# Patient Record
Sex: Female | Born: 1960 | Race: White | Hispanic: No | Marital: Single | Smoking: Former smoker
Health system: Southern US, Community
[De-identification: ages and names within clinical notes are randomized; demographics above are authoritative.]

## PROBLEM LIST (undated history)

## (undated) DIAGNOSIS — T1491XA Suicide attempt, initial encounter: Secondary | ICD-10-CM

## (undated) DIAGNOSIS — F419 Anxiety disorder, unspecified: Secondary | ICD-10-CM

## (undated) DIAGNOSIS — G8929 Other chronic pain: Secondary | ICD-10-CM

## (undated) DIAGNOSIS — J449 Chronic obstructive pulmonary disease, unspecified: Secondary | ICD-10-CM

## (undated) DIAGNOSIS — F32A Depression, unspecified: Secondary | ICD-10-CM

## (undated) DIAGNOSIS — J189 Pneumonia, unspecified organism: Secondary | ICD-10-CM

## (undated) DIAGNOSIS — Z789 Other specified health status: Secondary | ICD-10-CM

## (undated) DIAGNOSIS — F1910 Other psychoactive substance abuse, uncomplicated: Secondary | ICD-10-CM

## (undated) DIAGNOSIS — M5124 Other intervertebral disc displacement, thoracic region: Secondary | ICD-10-CM

## (undated) DIAGNOSIS — M549 Dorsalgia, unspecified: Secondary | ICD-10-CM

## (undated) DIAGNOSIS — F329 Major depressive disorder, single episode, unspecified: Secondary | ICD-10-CM

## (undated) DIAGNOSIS — Z8659 Personal history of other mental and behavioral disorders: Secondary | ICD-10-CM

## (undated) HISTORY — DX: Anxiety disorder, unspecified: F41.9

## (undated) HISTORY — DX: Personal history of other mental and behavioral disorders: Z86.59

## (undated) HISTORY — PX: TUBAL LIGATION: SHX77

## (undated) HISTORY — DX: Other intervertebral disc displacement, thoracic region: M51.24

## (undated) HISTORY — PX: CLAVICLE SURGERY: SHX598

---

## 2000-11-09 DIAGNOSIS — T1491XA Suicide attempt, initial encounter: Secondary | ICD-10-CM

## 2000-11-09 HISTORY — DX: Suicide attempt, initial encounter: T14.91XA

## 2000-11-27 ENCOUNTER — Inpatient Hospital Stay (HOSPITAL_COMMUNITY): Admission: EM | Admit: 2000-11-27 | Discharge: 2000-11-29 | Payer: Self-pay | Admitting: *Deleted

## 2009-05-02 ENCOUNTER — Emergency Department (HOSPITAL_COMMUNITY): Admission: EM | Admit: 2009-05-02 | Discharge: 2009-05-02 | Payer: Self-pay | Admitting: Emergency Medicine

## 2009-09-09 ENCOUNTER — Emergency Department (HOSPITAL_COMMUNITY): Admission: EM | Admit: 2009-09-09 | Discharge: 2009-09-09 | Payer: Self-pay | Admitting: Emergency Medicine

## 2009-12-19 ENCOUNTER — Emergency Department (HOSPITAL_COMMUNITY): Admission: EM | Admit: 2009-12-19 | Discharge: 2009-12-19 | Payer: Self-pay | Admitting: Emergency Medicine

## 2011-02-16 LAB — BASIC METABOLIC PANEL
BUN: 7 mg/dL (ref 6–23)
Calcium: 8.6 mg/dL (ref 8.4–10.5)
Creatinine, Ser: 0.6 mg/dL (ref 0.4–1.2)
GFR calc Af Amer: >60 mL/min (ref >60)
GFR calc non Af Amer: >60 mL/min (ref >60)

## 2011-02-16 LAB — DIFFERENTIAL
Basophils Absolute: 0 10*3/uL (ref 0.0–0.1)
Eosinophils Relative: 4 % (ref 0–5)
Lymphocytes Relative: 38 % (ref 12–46)
Lymphs Abs: 2.4 10*3/uL (ref 0.7–4.0)
Monocytes Absolute: 0.4 10*3/uL (ref 0.1–1.0)
Monocytes Relative: 7 % (ref 3–12)
Neutro Abs: 3.2 10*3/uL (ref 1.7–7.7)

## 2011-02-16 LAB — URINALYSIS, ROUTINE W REFLEX MICROSCOPIC
Bilirubin Urine: NEGATIVE
Hgb urine dipstick: NEGATIVE
Ketones, ur: NEGATIVE mg/dL
Nitrite: NEGATIVE
Protein, ur: NEGATIVE mg/dL
Specific Gravity, Urine: >1.03 — ABNORMAL HIGH (ref 1.005–1.030)

## 2011-02-16 LAB — CBC
Hemoglobin: 12.6 g/dL (ref 12.0–15.0)
RBC: 3.75 MIL/uL — ABNORMAL LOW (ref 3.87–5.11)
WBC: 6.3 10*3/uL (ref 4.0–10.5)

## 2011-02-16 LAB — URINE MICROSCOPIC-ADD ON

## 2011-03-27 NOTE — Discharge Summary (Signed)
[facility]  Patient:    Casey [name], Casey [name]                    MRN: [id number] Adm. Date:  11/27/00 Disc. Date: 11/29/00 Attending:  Otilio Saber Dictator:   Candi Leash. Orsini, N.P.                           Discharge Summary  HISTORY OF PRESENT ILLNESS:  This is a 50 year old, separated, white female, transferred from [facility] for suicide attempt with an overdose on Xanax.  The patient was transferred after being medically cleared for further psychiatric evaluation for depression and suicide attempt.  The patient overdosed on 30 Xanax and has spent a day of hospitalization in intensive care and on a ventilator for her overdose.  Precipitant to this overdose was a bad relationship with her boyfriend.  The patient has recently separated from her husband and has had several bad relationships in the past.  The patient reports some depression prior to this event and has been tried on antidepressant.  She states she may not have been on the medicine long enough for it to be effective.  The patient states she knew it was an impulsive act and wanted to only sleep and not to actually hurt herself.  Her appetite has been good.  She denies any auditory or visual hallucinations.  No paranoia. She is currently denying any suicidal or homicidal ideation.  This is the first admission for the psychiatric problem.  She has had no prior suicide attempts.  PAST MEDICAL HISTORY:  Primary care :  Dr. Roslynn Amble.  Her medical problems are asthma.  MEDICATIONS:  The patient has been on Xanax 1 mg b.i.d. for anxiety.  DRUG ALLERGIES:  The patient is allergic to AMPICILLIN, gets itchy.  PHYSICAL EXAMINATION:  Physical examination was performed at [facility] after her overdosing on 30 Xanax tabs, spent a day in ICU.  Urine drug screen was positive for benzodiazepines and positive for cocaine.  MENTAL STATUS EXAMINATION:  The patient is an alert,  young, white female, nicely dressed.  She is calm and cooperative.  Her speech is normal and relevant.  Her mood is pleasant.  Her affect is sad.  Thought processes are coherent.  There is no evidence of psychosis.  No auditory or visual hallucinations.  No homicidal ideation.  The patient was denying any current suicidal ideation, although she did have a serious overdose the day prior to [facility].  Cognitive function is intact.  Her memory is good.  Judgment is poor.  Insight is fair.  She has poor impulse control.  ADMISSION DIAGNOSES: Axis I:    Major depressive disorder, not otherwise specified. Axis II:   Deferred. Axis III:  Asthma. Axis IV:   Moderate with problems with primary support group. Axis V:    Current is 35, this past year 44.  HOSPITAL COURSE:  This is [facility] attempt and depression.  She is to contract for safety.  We will monitor her q.15 minutes.  The patient agrees to be safe on the unit.  Seroquel will be ordered on a p.r.n. basis for anxiety with trazodone ordered for sleep.  We will initiate a low-dose antidepressant with our plan to return the patient to her prior living arrangements.  The patient overall was doing well.   Her affect was better.  She  was feeling better.  She was denying any suicidal ideation.  Her mood and affect were bright.  She was sleeping and eating well with only mild anxiety present.  There was discussion to the patient with her relationship problems and need for possible therapy.  It was felt that the patient could be managed on an outpatient basis.  DISPOSITION:  The patient was discharged to home.  She is to follow up at [facility].  The patient was to call the following morning with an appointment with the phone number provided.  DISCHARGE MEDICATIONS: 1. Celexa 20 mg one-half tab q.a.m. 2. Klonopin 0.5 mg one-half tab two to three times a day  p.r.n. 3. Trazodone 50 mg one-half to one tab q.h.s. p.r.n.  ACTIVITY/DIET:  There is no restrictions for activity or diet.  DISCHARGE DIAGNOSES: Axis I:    Major depressive disorder. Axis II:   Deferred. Axis III:  Asthma. Axis IV:   Moderate with problems related to primary support group. Axis V:    Current is 60, this past year 52. DD:  01/05/01 TD:  01/06/01 Job: [id number]

## 2011-03-27 NOTE — H&P (Signed)
[facility]  Patient:    Casey [name], Casey [name]                    MRN: [id number] Adm. Date:  [id number] Attending:  Otilio Saber Dictator:   Candi Leash. Theressa Stamps, N.P.                   Psychiatric Admission Assessment  IDENTIFYING INFORMATION:  This is a 50 year old separated white female transferred from [facility] on November 27, 2000, for suicide attempt with an overdose on Xanax.  HISTORY OF PRESENT ILLNESS:  Patient was a transfer from [facility] after being medically cleared for further psychiatric evaluation of her depression and suicide attempt.  Patient had overdosed on 30 Xanax and had spent a day hospitalization in intensive care on a ventilator for her overdose.  Her precipitant to this overdose was having a bad relationship with her boyfriend.  Patient is recently separated from her husband and has had some bad relationships in the past.  Patient reports some depression prior to this event and has been tried on antidepressants in the past.  She states she may not have been on the medicine long enough for it to be effective.  Patient states that she knew it was an impulsive act and that she wanted to sleep only and not to hurt herself.  She reports her appetite has been good.  She denies any auditory or visual hallucinations, no paranoia.  She is currently denying any suicidal or homicidal ideation.  PAST PSYCHIATRIC HISTORY:  This is the first admission for a psychiatric problem.  She has had no prior suicide attempts.  SOCIAL HISTORY:  She is a 50 year old separated white female.  She has a 56 year old son.  She lives alone.  She works in Designer, fashion/clothing.  She completed the 12th grade.  She has no financial or legal problems.  FAMILY HISTORY:  She has a father and brother with alcohol problems.  ALCOHOL/DRUG HISTORY:  She smokes 2-3 cigarettes a day for about four years, drinks about two drinks per week, occasional marijuana use.   Denied any other substance abuse, although her urine drug screen at [facility] was positive for cocaine.  PRIMARY CARE :  Dr. _________ .  MEDICAL PROBLEMS:  Asthma.  She seldom uses any medicines, rarely has problems.  MEDICATIONS:  She has been on Xanax 1 mg b.i.d. for anxiety.  DRUG ALLERGIES:  She is allergic to AMPICILLIN.  She gets itchy.  PHYSICAL EXAMINATION:  Performed at [facility] after her overdose on her 30 Xanax tabs.  She spent one day in ICU on a ventilator.  Her urine drug screen was positive for benzodiazepines and positive for cocaine.  MENTAL STATUS EXAMINATION:  She is an alert young white female.  She is nicely dressed.  She is calm and cooperative.  Her speech is normal and relevant. Her mood is pleasant.  Her affect is sad.  Thought processes are coherent. There is no evidence of psychosis.  No auditory or visual hallucinations.  No homicidal ideation.  Denies any current suicidal ideation, although she did have a serious overdose on January 18.  Cognitive functioning is intact.  Her memory is good.  Her judgment is poor.  Insight is fair.  She had poor impulse control.  DIAGNOSES: Axis I:    Major depressive disorder, not otherwise specified. Axis II:   Deferred. Axis III:  Asthma. Axis IV:   Moderate with problems relating to primary support  group. Axis V:    Current 35, this past year 83.  PLAN:  Voluntary admission to [facility] for suicide attempt and depression.  Contract for safety.  Check every 15 minutes.  Patient agrees to be safe.  Will order Seroquel 25 every six hours on a p.r.n. basis for anxiety, Trazodone for sleep.  Will initiate a low dose antidepressant and our plan is to return patient to her prior living arrangement.  Tentative length of stay is three days. DD:  11/28/00 TD:  11/28/00 Job: [id number]

## 2012-04-07 DIAGNOSIS — R079 Chest pain, unspecified: Secondary | ICD-10-CM

## 2013-05-21 ENCOUNTER — Encounter (HOSPITAL_COMMUNITY): Payer: Self-pay | Admitting: *Deleted

## 2013-05-21 ENCOUNTER — Emergency Department (HOSPITAL_COMMUNITY)
Admission: EM | Admit: 2013-05-21 | Discharge: 2013-05-21 | Disposition: A | Discharge status: Home / Self Care | Payer: BC Managed Care – PPO | Attending: Emergency Medicine | Admitting: Emergency Medicine

## 2013-05-21 DIAGNOSIS — G5631 Lesion of radial nerve, right upper limb: Secondary | ICD-10-CM

## 2013-05-21 DIAGNOSIS — M79609 Pain in unspecified limb: Secondary | ICD-10-CM | POA: Insufficient documentation

## 2013-05-21 DIAGNOSIS — Z7982 Long term (current) use of aspirin: Secondary | ICD-10-CM | POA: Insufficient documentation

## 2013-05-21 DIAGNOSIS — Z79899 Other long term (current) drug therapy: Secondary | ICD-10-CM | POA: Insufficient documentation

## 2013-05-21 DIAGNOSIS — F17200 Nicotine dependence, unspecified, uncomplicated: Secondary | ICD-10-CM | POA: Insufficient documentation

## 2013-05-21 DIAGNOSIS — G5630 Lesion of radial nerve, unspecified upper limb: Secondary | ICD-10-CM | POA: Insufficient documentation

## 2013-05-21 MED ORDER — OXYCODONE-ACETAMINOPHEN 5-325 MG PO TABS: 1.0000 | ORAL_TABLET | Freq: Four times a day (QID) | ORAL | Status: DC | PRN

## 2013-05-21 MED ORDER — OXYCODONE-ACETAMINOPHEN 5-325 MG PO TABS
1.0000 | ORAL_TABLET | Freq: Once | ORAL | Status: AC
  Administered 2013-05-21: 1 via ORAL
  Filled 2013-05-21: qty 1

## 2013-05-21 NOTE — ED Notes (Signed)
Pt states that she woke up yesterday with right arm. States that she is unable to grasp anything with her right hand without dropping it. Denies any problems with speech, denies any injury or pain .

## 2013-05-21 NOTE — ED Provider Notes (Signed)
History    This chart was scribed for [employer]. Rubin Payor, MD by Leone Payor, ED Scribe. This patient was seen in room APA12/APA12 and the patient's care was started 1:55 PM.  CSN: [id number] Arrival date & time 05/21/13  1330  First MD Initiated Contact with Patient 05/21/13 1350     Chief Complaint  Patient presents with  . Weakness    The history is provided by the patient. No language interpreter was used.   HPI Comments: Casey [name] is a 52 y.o. female who presents to the Emergency Department complaining of constant, unchanged weakness to the R arm that started yesterday. Per pt, she woke up with her R arm underneath her body. States the arm was asleep when she woke up. She denies h/o carpal tunnel. Reports her elbow is painful but she can move it. She was unable to hold objects very well without dropping it. States she can hold a can of soda for a few seconds and then she will lose control and tip over. She denies HA.    History reviewed. No pertinent past medical history. Past Surgical History  Procedure Laterality Date  . Cesarean section     No family history on file. History  Substance Use Topics  . Smoking status: Current Every Day Smoker  . Smokeless tobacco: Not on file  . Alcohol Use: Yes     Comment: occassional   OB History   Grav Para Term Preterm Abortions TAB SAB Ect Mult Living                 Review of Systems  Musculoskeletal: Positive for arthralgias (R arm).  Neurological: Positive for numbness.  All other systems reviewed and are negative.    Allergies  Review of patient's allergies indicates no known allergies.  Home Medications   Current Outpatient Rx  Name  Route  Sig  Dispense  Refill  . ALPRAZolam (XANAX) 1 MG tablet   Oral   Take 1 mg by mouth 3 (three) times daily as needed for sleep.         Marland Kitchen aspirin EC 81 MG tablet   Oral   Take 81 mg by mouth daily.         Marland Kitchen BIOTIN PO   Oral   Take 1 tablet by mouth daily.         . fish oil-omega-3 fatty acids 1000 MG capsule   Oral   Take 3 g by mouth 3 (three) times daily.          Marland Kitchen oxyCODONE-acetaminophen (PERCOCET) 7.5-325 MG per tablet   Oral   Take 1 tablet by mouth every 4 (four) hours as needed for pain.         Marland Kitchen oxyCODONE-acetaminophen (PERCOCET/ROXICET) 5-325 MG per tablet   Oral   Take 1-2 tablets by mouth every 6 (six) hours as needed for pain.   10 tablet   0    BP 127/88  Pulse 68  Temp(Src) 98.5 F (36.9 C)  Resp 17  Ht 5\' 8"  (1.727 m)  Wt 132 lb (59.875 kg)  BMI 20.08 kg/m2  SpO2 98% Physical Exam  Nursing note and vitals reviewed. Constitutional: She is oriented to person, place, and time. She appears well-developed and well-nourished.  HENT:  Head: Normocephalic and atraumatic.  Eyes: Conjunctivae and EOM are normal. Pupils are equal, round, and reactive to light.  Neck: Normal range of motion. Neck supple.  Cardiovascular: Normal rate, regular rhythm and  normal heart sounds.   Good cap refill  Pulmonary/Chest: Effort normal and breath sounds normal.  Abdominal: Soft. Bowel sounds are normal.  Musculoskeletal: Normal range of motion.  Abduction and adduction intact at bilateral shoulders. Flexion and extension at bilateral elbows intact. Weakness with flexion of R wrist. Flexion and extension otherwise intact. Paresthesia over radial and ulnar distribution on R hand. Normal on L hand. Normal medial on R hand. Weakened pronation on R forearm.   Neurological: She is alert and oriented to person, place, and time. No cranial nerve deficit.  Normal gait.   Skin: Skin is warm and dry.  Psychiatric: She has a normal mood and affect.    ED Course  Procedures (including critical care time)  DIAGNOSTIC STUDIES: Oxygen Saturation is 98% on RA, normal by my interpretation.    COORDINATION OF CARE: 2:04 PM Discussed treatment plan with pt at bedside and pt agreed to plan.   Labs Reviewed - No data to display No results  found. 1. Radial nerve palsy, right     MDM  Patient with radial nerve palsy on the right side. She states she slept on it wrong. There is some pain also. there is not other evidence of central ischemia. Will splint, give pain medicines, had follow with neurology  I personally performed the services described in this documentation, which was scribed in my presence. The recorded information has been reviewed and is accurate.    Juliet Rude. Rubin Payor, MD 05/21/13 1438

## 2013-11-06 ENCOUNTER — Encounter (HOSPITAL_COMMUNITY): Payer: Self-pay | Admitting: Emergency Medicine

## 2013-11-06 ENCOUNTER — Emergency Department (HOSPITAL_COMMUNITY)
Admission: EM | Admit: 2013-11-06 | Discharge: 2013-11-06 | Disposition: A | Discharge status: Home / Self Care | Payer: BC Managed Care – PPO | Attending: Emergency Medicine | Admitting: Emergency Medicine

## 2013-11-06 ENCOUNTER — Emergency Department (HOSPITAL_COMMUNITY): Payer: BC Managed Care – PPO

## 2013-11-06 DIAGNOSIS — Y939 Activity, unspecified: Secondary | ICD-10-CM | POA: Insufficient documentation

## 2013-11-06 DIAGNOSIS — W010XXA Fall on same level from slipping, tripping and stumbling without subsequent striking against object, initial encounter: Secondary | ICD-10-CM | POA: Insufficient documentation

## 2013-11-06 DIAGNOSIS — S42001A Fracture of unspecified part of right clavicle, initial encounter for closed fracture: Secondary | ICD-10-CM

## 2013-11-06 DIAGNOSIS — F17200 Nicotine dependence, unspecified, uncomplicated: Secondary | ICD-10-CM | POA: Insufficient documentation

## 2013-11-06 DIAGNOSIS — Y929 Unspecified place or not applicable: Secondary | ICD-10-CM | POA: Insufficient documentation

## 2013-11-06 DIAGNOSIS — S42023A Displaced fracture of shaft of unspecified clavicle, initial encounter for closed fracture: Secondary | ICD-10-CM | POA: Insufficient documentation

## 2013-11-06 DIAGNOSIS — Z79899 Other long term (current) drug therapy: Secondary | ICD-10-CM | POA: Insufficient documentation

## 2013-11-06 MED ORDER — HYDROMORPHONE HCL PF 1 MG/ML IJ SOLN
1.0000 mg | Freq: Once | INTRAMUSCULAR | Status: AC
  Administered 2013-11-06: 1 mg via INTRAMUSCULAR
  Filled 2013-11-06: qty 1

## 2013-11-06 NOTE — ED Notes (Signed)
Tripped and fell  2 days ago, Pain rt shoulder.

## 2013-11-06 NOTE — ED Notes (Addendum)
Pt fell after tripping over her mother's oxygen cord and landed on right shoulder, denies hitting her head or LOC

## 2013-11-07 ENCOUNTER — Ambulatory Visit (INDEPENDENT_AMBULATORY_CARE_PROVIDER_SITE_OTHER): Payer: BC Managed Care – PPO | Admitting: Orthopedic Surgery

## 2013-11-07 ENCOUNTER — Encounter: Payer: Self-pay | Admitting: Orthopedic Surgery

## 2013-11-07 VITALS — BP 106/78 | Ht 68.0 in | Wt 133.0 lb

## 2013-11-07 DIAGNOSIS — S42009A Fracture of unspecified part of unspecified clavicle, initial encounter for closed fracture: Secondary | ICD-10-CM | POA: Insufficient documentation

## 2013-11-07 DIAGNOSIS — S42001A Fracture of unspecified part of right clavicle, initial encounter for closed fracture: Secondary | ICD-10-CM

## 2013-11-07 NOTE — Progress Notes (Signed)
Patient ID: Casey [name], female   DOB: [birth year], 52 y.o.   MRN: [id number]  Chief Complaint  Patient presents with  . 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 She is followed for chronic back and knee pain with a diagnosis of lumbar disc disease requiring Percocet 7.5 mg for relief  Her right shoulder is painful throbbing stabbing sharp 10 out of 10 constant pain associated with swelling no numbness or tingling is noted she has a history of snoring joint pain instability stiffness anxiety seasonal allergies  Her medical problems are anxiety. Surgeries C-section. Medications Xanax 0.1 mg oxycodone 7.5 mg normal. Family history heart disease arthritis lung disease cancer asthma and diabetes  Social history divorced. 3 cigarettes per day. No alcohol use.  Normal development grooming hygiene are noted mood and affect are normal she is oriented x3 she is now ambulatory abnormalities her vital signs are stable BP 106/78  Ht 5\' 8"  (1.727 m)  Wt 133 lb (60.328 kg)  BMI 20.23 kg/m2  Her right shoulder shows tenderness over the clavicle is no tenting of the skin she has decreased range of motion stability cannot be assessed motor exam revealed normal muscle tone hand and wrist function were normal skin was intact except for an abrasion over the medial side the arm which was superficial. She has normal pulse and sensation in the right arm and no lymphadenopathy.  X-ray showed midshaft clavicle fracture minimal displacement  3 shoulder x-rays were also reviewed with the clavicle x-rays  Recommend sling She'll be out of work 8-12 weeks Switch to morphine MS Contin 15 mg twice a day along with her 7.5 mg of Percocet  Return in 2 months for x-ray until healing

## 2013-11-07 NOTE — Patient Instructions (Addendum)
OOW X 12 WEEKS   WEAR SLING   SLEEP IN RECLINER OR UPRIGHT FOR 2 WEEKS   Clavicle Fracture A clavicle fracture is a break in the collarbone. This is a common injury, especially in children. Collarbones do not harden until around the age of 83. Most collarbone fractures are treated with a simple arm sling. In some cases a figure-of-eight splint is used to help hold the broken bones in position. Although not often needed, surgery may be required if the bone fragments are not in the correct position (displaced).  HOME CARE INSTRUCTIONS   Apply ice to the injury for 15-20 minutes each hour while awake for 2 days. Put the ice in a plastic bag and place a towel between the bag of ice and your skin.  Wear the sling or splint constantly for as long as directed by your caregiver. You may remove the sling or splint for bathing or showering. Be sure to keep your shoulder in the same place as when the sling or splint is on. Do not lift your arm.  If a figure-of-eight splint is applied, it must be tightened by another person every day. Tighten it enough to keep the shoulders held back. Allow enough room to place the index finger between the body and strap. Loosen the splint immediately if you feel numbness or tingling in your hands.  Only take over-the-counter or prescription medicines for pain, discomfort, or fever as directed by your caregiver.  Avoid activities that irritate or increase the pain for 4 to 6 weeks after surgery.  Follow all instructions for follow-up with your caregiver. This includes any referrals, physical therapy, and rehabilitation. Any delay in obtaining necessary care could result in a delay or failure of the injury to heal properly. SEEK MEDICAL CARE IF:  You have pain and swelling that are not relieved with medications. SEEK IMMEDIATE MEDICAL CARE IF:  Your arm is numb, cold, or pale, even when the splint is loose. MAKE SURE YOU:   Understand these instructions.  Will watch  your condition.  Will get help right away if you are not doing well or get worse. Document Released: 08/05/2005 Document Revised: 01/18/2012 Document Reviewed: 05/31/2008 [facility] Patient Information 2014 [place], [place].

## 2013-11-08 NOTE — ED Provider Notes (Signed)
History/physical exam/procedure(s) were performed by non-physician practitioner and as supervising physician I was immediately available for consultation/collaboration. I have reviewed all notes and am in agreement with care and plan.    S , MD 11/08/13 1033 

## 2013-11-08 NOTE — ED Provider Notes (Signed)
CSN: [id number]     Arrival date & time 11/06/13  1056 History   First MD Initiated Contact with Patient 11/06/13 1354     Chief Complaint  Patient presents with  . Fall   (Consider location/radiation/quality/duration/timing/severity/associated sxs/prior Treatment) HPI Comments: Casey [name] is a 52 y.o. Female who tripped and fell 2 days ago, landing on her right side.  She has had persistent pain and a palpable deformity along her right clavicle.  Her pain is constant, worsened with palpation and attempts to elevate the arm. She denies neck or head pain, elbow, forearm or hand pain as well.  She has taken ibuprofen and oxycodone with temporary relief of pain.  She denies numbness or tingling in her right arm.     The history is provided by the patient.    History reviewed. No pertinent past medical history. Past Surgical History  Procedure Laterality Date  . Cesarean section    . Tubal ligation     History reviewed. No pertinent family history. History  Substance Use Topics  . Smoking status: Current Every Day Smoker  . Smokeless tobacco: Not on file  . Alcohol Use: Yes     Comment: occassional   OB History   Grav Para Term Preterm Abortions TAB SAB Ect Mult Living                 Review of Systems  Constitutional: Negative for fever.  Musculoskeletal: Positive for arthralgias. Negative for back pain, joint swelling, myalgias and neck pain.  Neurological: Negative for weakness, numbness and headaches.    Allergies  Review of patient's allergies indicates no known allergies.  Home Medications   Current Outpatient Rx  Name  Route  Sig  Dispense  Refill  . ALPRAZolam (XANAX) 1 MG tablet   Oral   Take 1 mg by mouth 3 (three) times daily as needed for sleep.         Marland Kitchen BIOTIN PO   Oral   Take 1 tablet by mouth daily.         . fish oil-omega-3 fatty acids 1000 MG capsule   Oral   Take 3 g by mouth 3 (three) times daily.          Marland Kitchen ibuprofen  (ADVIL,MOTRIN) 200 MG tablet   Oral   Take 400-800 mg by mouth every 6 (six) hours as needed for moderate pain.         Marland Kitchen oxyCODONE-acetaminophen (PERCOCET) 7.5-325 MG per tablet   Oral   Take 1 tablet by mouth every 8 (eight) hours as needed for pain.           BP 135/79  Pulse 67  Temp(Src) 97.4 F (36.3 C) (Oral)  Resp 24  Ht 5\' 8"  (1.727 m)  Wt 140 lb (63.504 kg)  BMI 21.29 kg/m2  SpO2 100% Physical Exam  Constitutional: She appears well-developed and well-nourished.  HENT:  Head: Atraumatic.  Neck: Normal range of motion.  Cardiovascular:  Pulses equal bilaterally  Musculoskeletal: She exhibits tenderness.       Right shoulder: She exhibits deformity and pain.       Cervical back: She exhibits no tenderness and no bony tenderness.  Palpable deformity mid right clavicle.  Skin intact.  No ttp right shoulder joint or humeral head.  Non tender right elbow, wrist and hand.  Neurological: She is alert. She has normal strength. She displays normal reflexes. No sensory deficit.  Equal strength  Skin: Skin is warm  and dry.  Psychiatric: She has a normal mood and affect.    ED Course  Procedures (including critical care time) Labs Review Labs Reviewed - No data to display Imaging Review Dg Clavicle Right  11/06/2013   CLINICAL DATA:  Shoulder pain after a fall.  EXAM: RIGHT CLAVICLE - 2+ VIEWS  COMPARISON:  None.  FINDINGS: There is an angulated oblique slightly displaced fracture of the midshaft of the right clavicle. No other abnormality.  IMPRESSION: Mid right clavicle fracture.   Electronically Signed   By: Geanie Cooley M.D.   On: 11/06/2013 12:57   Dg Shoulder Right  11/06/2013   CLINICAL DATA:  Right shoulder and clavicle pain secondary to a fall.  EXAM: RIGHT SHOULDER - 2+ VIEW  COMPARISON:  None.  FINDINGS: There is a displaced is oblique fracture of the midshaft of the right clavicle with slight angulation. Shoulder joint appears normal.  IMPRESSION: Displaced  fracture of the midshaft of the right clavicle. Normal right shoulder joint.   Electronically Signed   By: Geanie Cooley M.D.   On: 11/06/2013 12:56    EKG Interpretation   None       MDM   1. Clavicle fracture, right, closed, initial encounter    Patients labs and/or radiological studies were viewed and considered during the medical decision making and disposition process. Pt was given dilaudid 1 mg IM with pain improvement. She was placed in a shoulder sling/ immobilizer.  Referral given to ortho for f/u care.    The patient appears reasonably screened and/or stabilized for discharge and I doubt any other medical condition or other [facility] requiring further screening, evaluation, or treatment in the ED at this time prior to discharge.     Burgess Amor, PA-C 11/08/13 [phone]

## 2013-11-13 ENCOUNTER — Telehealth: Payer: Self-pay | Admitting: Orthopedic Surgery

## 2013-11-13 ENCOUNTER — Encounter: Payer: Self-pay | Admitting: Orthopedic Surgery

## 2013-11-13 NOTE — Telephone Encounter (Signed)
Patient has question about still having the "popping" feeling in shoulder (clavicle injury/fracture of 11/06/13) that she wants to make sure this is normal.  She is also mentioning that it is very difficult to get in a comfortable position to sleep.  Her next scheduled appointment is 12/12/13.  Please advise of any recommendations.  Ph [phone]

## 2013-11-14 NOTE — Telephone Encounter (Signed)
Routing to Dr Harrison 

## 2013-11-15 NOTE — Telephone Encounter (Signed)
We will determine in February at the next appointment not unusual

## 2013-11-15 NOTE — Telephone Encounter (Signed)
Advised patient of Dr. Harrison's reply. 

## 2013-11-23 ENCOUNTER — Telehealth: Payer: Self-pay | Admitting: Orthopedic Surgery

## 2013-11-23 NOTE — Telephone Encounter (Signed)
Routing to Dr Harrison 

## 2013-11-23 NOTE — Telephone Encounter (Signed)
Call received from patient, states has order for brace from primary care physician, Dr. Olena LeatherwoodHasanaj, for "Figure 8 shoulder brace", and was advised to fill it at [facility].  Patient said insurance may not cover.  Asking if this is okay to have filled for her problem of clavicle fracture?  Her next scheduled appointment is 12/12/13.  She also mentioned she has a "red flag" from a pain medication prescription which she said she had at one point asked for, but is not taking; said she made a mistake.  Please advise about brace.  Her ph#'s are [phone] or [phone]

## 2013-11-24 NOTE — Telephone Encounter (Signed)
YES

## 2013-11-24 NOTE — Telephone Encounter (Signed)
Returned call to patient and informed her of Dr. Mort SawyersHarrison's reply.

## 2013-12-12 ENCOUNTER — Encounter: Payer: Self-pay | Admitting: Orthopedic Surgery

## 2013-12-12 ENCOUNTER — Ambulatory Visit (INDEPENDENT_AMBULATORY_CARE_PROVIDER_SITE_OTHER): Payer: BC Managed Care – PPO

## 2013-12-12 ENCOUNTER — Ambulatory Visit (INDEPENDENT_AMBULATORY_CARE_PROVIDER_SITE_OTHER): Payer: BC Managed Care – PPO | Admitting: Orthopedic Surgery

## 2013-12-12 VITALS — BP 160/88 | Ht 68.0 in | Wt 133.0 lb

## 2013-12-12 DIAGNOSIS — S42009A Fracture of unspecified part of unspecified clavicle, initial encounter for closed fracture: Secondary | ICD-10-CM

## 2013-12-12 DIAGNOSIS — S42001A Fracture of unspecified part of right clavicle, initial encounter for closed fracture: Secondary | ICD-10-CM

## 2013-12-12 MED ORDER — OXYCODONE-ACETAMINOPHEN 7.5-325 MG PO TABS: 1.0000 | ORAL_TABLET | Freq: Three times a day (TID) | ORAL | Status: DC | PRN

## 2013-12-12 NOTE — Patient Instructions (Signed)
Sling as needed   OOW NOTE EXTENDS TO 12 WEEKS FROM 11/04/2013

## 2013-12-12 NOTE — Progress Notes (Signed)
Chief Complaint  Patient presents with  . Follow-up    one month recheck fractured clavicle DOI 11/04/13    Encounter Diagnosis  Name Primary?  . Right clavicle fracture Yes    Followup x-rays 38 days post right clavicle fracture  X-rays show fracture is healing  The patient still tender with decreased range of motion. She did not tolerate morphine as well as go back on Percocet  Percocet prescription refilled  Addendum the patient will eventually need to have her MRI reviewed possible epidural steroid injections, has chronic pain has been on Percocet for pain for quite a while.  Remove sling. Continue out of work for balance and 12 weeks. Return in a month for x-rays.

## 2013-12-28 ENCOUNTER — Telehealth: Payer: Self-pay | Admitting: Orthopedic Surgery

## 2013-12-28 NOTE — Telephone Encounter (Signed)
Notes (date of servic and updated short-term disability form faxed to [employer] to fax# [phone] / ph# [phone].  Signed authorization on file. Patient aware of status.

## 2014-01-09 ENCOUNTER — Ambulatory Visit (INDEPENDENT_AMBULATORY_CARE_PROVIDER_SITE_OTHER): Payer: BC Managed Care – PPO

## 2014-01-09 ENCOUNTER — Ambulatory Visit (INDEPENDENT_AMBULATORY_CARE_PROVIDER_SITE_OTHER): Payer: BC Managed Care – PPO | Admitting: Orthopedic Surgery

## 2014-01-09 ENCOUNTER — Encounter: Payer: Self-pay | Admitting: Orthopedic Surgery

## 2014-01-09 VITALS — BP 128/82 | Ht 68.0 in | Wt 133.0 lb

## 2014-01-09 DIAGNOSIS — S42009A Fracture of unspecified part of unspecified clavicle, initial encounter for closed fracture: Secondary | ICD-10-CM

## 2014-01-09 DIAGNOSIS — S42001A Fracture of unspecified part of right clavicle, initial encounter for closed fracture: Secondary | ICD-10-CM

## 2014-01-09 MED ORDER — OXYCODONE-ACETAMINOPHEN 7.5-325 MG PO TABS: 1.0000 | ORAL_TABLET | Freq: Three times a day (TID) | ORAL | Status: DC | PRN

## 2014-01-09 NOTE — Progress Notes (Signed)
Patient ID: Casey [name], female   DOB: Oct 07, 1961, 53 y.o.   MRN: [id number] Chief Complaint  Patient presents with  . Follow-up    one month recheck right clavicle fracture DOI 11/04/13   Encounter Diagnosis  Name Primary?  . Right clavicle fracture Yes    X-rays right clavicle show complete resolution of the fracture. Patient's range of motion is returned to normal. She has a strength deficit in the rotator cuff. Advise strengthening exercises with their a band. Followup 3 weeks to return to work. Meds ordered this encounter  Medications  . oxyCODONE-acetaminophen (PERCOCET) 7.5-325 MG per tablet    Sig: Take 1 tablet by mouth every 8 (eight) hours as needed for pain.    Dispense:  90 tablet    Refill:  0

## 2014-01-09 NOTE — Patient Instructions (Signed)
You can start to lift Start doing theraband exercises

## 2014-01-10 ENCOUNTER — Telehealth: Payer: Self-pay | Admitting: Orthopedic Surgery

## 2014-01-10 NOTE — Telephone Encounter (Signed)
Notes (date of servic 01/09/14) for update of short-term disability faxed to [facility] [employer] to fax# [phone] / ph# [phone]. Signed authorization on file. Patient aware of status.

## 2014-01-30 ENCOUNTER — Encounter: Payer: Self-pay | Admitting: Orthopedic Surgery

## 2014-01-30 ENCOUNTER — Ambulatory Visit (INDEPENDENT_AMBULATORY_CARE_PROVIDER_SITE_OTHER): Payer: BC Managed Care – PPO | Admitting: Orthopedic Surgery

## 2014-01-30 VITALS — BP 125/73 | Ht 68.0 in | Wt 133.0 lb

## 2014-01-30 DIAGNOSIS — S42009A Fracture of unspecified part of unspecified clavicle, initial encounter for closed fracture: Secondary | ICD-10-CM

## 2014-01-30 DIAGNOSIS — S42001A Fracture of unspecified part of right clavicle, initial encounter for closed fracture: Secondary | ICD-10-CM

## 2014-01-30 MED ORDER — OXYCODONE-ACETAMINOPHEN 7.5-325 MG PO TABS: 1.0000 | ORAL_TABLET | Freq: Three times a day (TID) | ORAL | Status: DC | PRN

## 2014-01-30 NOTE — Progress Notes (Signed)
Patient ID: [name], female   DOB: 18-May-1961, 53 y.o.   MRN: [id number]  Chief Complaint  Patient presents with  . Follow-up    3 week recheck follow up clavicle fracture s/p home exercises DOI 11/04/13    Followup visit status post clavicle fracture treated after healing with exercises doing well  She will be placed on another dose of Percocet and sent for pain management. She was on Percocet for at least a year prior to her clavicle fracture. She was released from her primary care physician because she obtained morphine from the while she was on contract from him. I do not consider this breaking a contract that she had a new injury  Recommend she see pain management specialist for chronic pain management. As far as the shoulder goes she does regain her range of motion and she has no pain

## 2014-01-30 NOTE — Patient Instructions (Signed)
Set up appointment for back

## 2014-01-31 ENCOUNTER — Other Ambulatory Visit: Payer: Self-pay | Admitting: *Deleted

## 2014-01-31 ENCOUNTER — Telehealth: Payer: Self-pay | Admitting: *Deleted

## 2014-01-31 DIAGNOSIS — G894 Chronic pain syndrome: Secondary | ICD-10-CM

## 2014-01-31 NOTE — Telephone Encounter (Signed)
Faxed referral and office notes to Dr. Cornell Barman'Tool. Awaiting appointment.

## 2014-03-07 NOTE — Telephone Encounter (Signed)
Patient declined appointment with Dr. Val Eagle Tool.

## 2014-09-03 ENCOUNTER — Encounter: Payer: Self-pay | Admitting: *Deleted

## 2014-09-03 DIAGNOSIS — Z6820 Body mass index (BMI) 20.0-20.9, adult: Secondary | ICD-10-CM | POA: Insufficient documentation

## 2014-09-03 DIAGNOSIS — R5383 Other fatigue: Secondary | ICD-10-CM | POA: Insufficient documentation

## 2014-09-03 DIAGNOSIS — R5381 Other malaise: Secondary | ICD-10-CM | POA: Insufficient documentation

## 2014-09-11 ENCOUNTER — Ambulatory Visit (INDEPENDENT_AMBULATORY_CARE_PROVIDER_SITE_OTHER): Payer: BC Managed Care – PPO | Admitting: Adult Health

## 2014-09-11 ENCOUNTER — Other Ambulatory Visit (HOSPITAL_COMMUNITY)
Admission: RE | Admit: 2014-09-11 | Discharge: 2014-09-11 | Disposition: A | Discharge status: Home / Self Care | Payer: BC Managed Care – PPO | Source: Ambulatory Visit | Attending: Adult Health | Admitting: Adult Health

## 2014-09-11 ENCOUNTER — Encounter: Payer: Self-pay | Admitting: Adult Health

## 2014-09-11 VITALS — BP 108/60 | HR 76 | Ht 65.5 in | Wt 129.5 lb

## 2014-09-11 DIAGNOSIS — R87810 Cervical high risk human papillomavirus (HPV) DNA test positive: Secondary | ICD-10-CM | POA: Insufficient documentation

## 2014-09-11 DIAGNOSIS — Z1151 Encounter for screening for human papillomavirus (HPV): Secondary | ICD-10-CM | POA: Insufficient documentation

## 2014-09-11 DIAGNOSIS — Z01411 Encounter for gynecological examination (general) (routine) with abnormal findings: Secondary | ICD-10-CM | POA: Insufficient documentation

## 2014-09-11 DIAGNOSIS — Z8659 Personal history of other mental and behavioral disorders: Secondary | ICD-10-CM | POA: Insufficient documentation

## 2014-09-11 DIAGNOSIS — Z01419 Encounter for gynecological examination (general) (routine) without abnormal findings: Secondary | ICD-10-CM

## 2014-09-11 DIAGNOSIS — Z113 Encounter for screening for infections with a predominantly sexual mode of transmission: Secondary | ICD-10-CM | POA: Insufficient documentation

## 2014-09-11 HISTORY — DX: Personal history of other mental and behavioral disorders: Z86.59

## 2014-09-11 NOTE — Progress Notes (Signed)
Patient ID: Casey [name], female   DOB: 11-27-1960, 53 y.o.   MRN: [id number] History of Present Illness: Casey [name] is a 53 year old white female,single, in for a pap and physical.She is new to this practice and says her last pap about 13 years ago and has never had mammogram.She has history of panic attacks and is off xanax for 4 weeks.Says had +UDS for pain meds and Dr Olena LeatherwoodHasanaj stopped her meds, she is on paxil.Has been stressed, brother died on 2013 and Mom died this year.   Current Medications, Allergies, Past Medical History, Past Surgical History, Family History and Social History were reviewed in [facility] electronic medical record.     Review of Systems: Patient denies any headaches, blurred vision, shortness of breath, chest pain, abdominal pain, problems with bowel movements, urination, or intercourse. Not having sex but is dry, and has some hot flashes, no joint pain has chronic back pain, no mood swings.See HPI.   Physical Exam:BP 108/60 mmHg  Pulse 76  Ht 5' 5.5" (1.664 m)  Wt 129 lb 8 oz (58.741 kg)  BMI 21.21 kg/m2 General:  Well developed, well nourished, no acute distress Skin:  Warm and dry Neck:  Midline trachea, normal thyroid Lungs; Clear to auscultation bilaterally Breast:  No dominant palpable mass, retraction, or nipple discharge Cardiovascular: Regular rate and rhythm Abdomen:  Soft, non tender, no hepatosplenomegaly Pelvic:  External genitalia is normal in appearance for age.  The vagina has decreased color, moisture and rugae. The cervix is smooth and stenotic at os, pap with HPV and GC.CHL performed.  Uterus is felt to be normal size, shape, and contour.  No   adnexal masses or tenderness noted. Rectal: Good sphincter tone, no polyps, or hemorrhoids felt.  Hemoccult negative. Extremities:  No swelling or varicosities noted Psych:  No mood changes,alert and cooperative,seems happy but anxious.She ask for xanax and I told her I could not prescribe those to see  PCP or mental health doctor. Discussed HRT and she declines.She got flu shot a work.  Impression: Well woman exam with pap in new pt. History of panic attacks    Plan: Try Luvena for vaginal moisture and astro glide for sex Physical in 1 year Mammogram advised now and yearly [facility] Colon[facility] advised Labs with PCP

## 2014-09-11 NOTE — Patient Instructions (Addendum)
Physical in 1 year Mammogram now and yearly [phone] Colonoscopy advised  Labs with Dr Ky BarbanHasanaj  LUVENA for vaginal dryness ASTRO GLIDE for sex

## 2014-09-12 LAB — CYTOLOGY - PAP

## 2016-01-08 ENCOUNTER — Encounter (HOSPITAL_COMMUNITY): Payer: Self-pay | Admitting: Emergency Medicine

## 2016-01-08 ENCOUNTER — Emergency Department (HOSPITAL_COMMUNITY): Payer: BLUE CROSS/BLUE SHIELD

## 2016-01-08 ENCOUNTER — Emergency Department (HOSPITAL_COMMUNITY)
Admission: EM | Admit: 2016-01-08 | Discharge: 2016-01-09 | Disposition: A | Discharge status: Other Acute Inpatient Hospital | Payer: BLUE CROSS/BLUE SHIELD | Attending: Emergency Medicine | Admitting: Emergency Medicine

## 2016-01-08 DIAGNOSIS — F1010 Alcohol abuse, uncomplicated: Secondary | ICD-10-CM | POA: Insufficient documentation

## 2016-01-08 DIAGNOSIS — F17210 Nicotine dependence, cigarettes, uncomplicated: Secondary | ICD-10-CM | POA: Insufficient documentation

## 2016-01-08 DIAGNOSIS — Y998 Other external cause status: Secondary | ICD-10-CM | POA: Diagnosis not present

## 2016-01-08 DIAGNOSIS — T426X2A Poisoning by other antiepileptic and sedative-hypnotic drugs, intentional self-harm, initial encounter: Secondary | ICD-10-CM | POA: Insufficient documentation

## 2016-01-08 DIAGNOSIS — Y9389 Activity, other specified: Secondary | ICD-10-CM | POA: Insufficient documentation

## 2016-01-08 DIAGNOSIS — F329 Major depressive disorder, single episode, unspecified: Secondary | ICD-10-CM | POA: Diagnosis not present

## 2016-01-08 DIAGNOSIS — F1410 Cocaine abuse, uncomplicated: Secondary | ICD-10-CM | POA: Insufficient documentation

## 2016-01-08 DIAGNOSIS — Z8739 Personal history of other diseases of the musculoskeletal system and connective tissue: Secondary | ICD-10-CM | POA: Diagnosis not present

## 2016-01-08 DIAGNOSIS — F1310 Sedative, hypnotic or anxiolytic abuse, uncomplicated: Secondary | ICD-10-CM | POA: Insufficient documentation

## 2016-01-08 DIAGNOSIS — G8929 Other chronic pain: Secondary | ICD-10-CM | POA: Diagnosis not present

## 2016-01-08 DIAGNOSIS — F410 Panic disorder [episodic paroxysmal anxiety] without agoraphobia: Secondary | ICD-10-CM | POA: Insufficient documentation

## 2016-01-08 DIAGNOSIS — N390 Urinary tract infection, site not specified: Secondary | ICD-10-CM | POA: Insufficient documentation

## 2016-01-08 DIAGNOSIS — Y9289 Other specified places as the place of occurrence of the external cause: Secondary | ICD-10-CM | POA: Insufficient documentation

## 2016-01-08 DIAGNOSIS — Z915 Personal history of self-harm: Secondary | ICD-10-CM | POA: Diagnosis not present

## 2016-01-08 DIAGNOSIS — F1910 Other psychoactive substance abuse, uncomplicated: Secondary | ICD-10-CM

## 2016-01-08 DIAGNOSIS — T50902A Poisoning by unspecified drugs, medicaments and biological substances, intentional self-harm, initial encounter: Secondary | ICD-10-CM

## 2016-01-08 DIAGNOSIS — Z791 Long term (current) use of non-steroidal anti-inflammatories (NSAID): Secondary | ICD-10-CM | POA: Diagnosis not present

## 2016-01-08 DIAGNOSIS — Z008 Encounter for other general examination: Secondary | ICD-10-CM | POA: Diagnosis present

## 2016-01-08 HISTORY — DX: Depression, unspecified: F32.A

## 2016-01-08 HISTORY — DX: Other chronic pain: G89.29

## 2016-01-08 HISTORY — DX: Suicide attempt, initial encounter: T14.91XA

## 2016-01-08 HISTORY — DX: Major depressive disorder, single episode, unspecified: F32.9

## 2016-01-08 HISTORY — DX: Dorsalgia, unspecified: M54.9

## 2016-01-08 LAB — CBC WITH DIFFERENTIAL/PLATELET
Basophils Absolute: 0 10*3/uL (ref 0.0–0.1)
Basophils Relative: 0 %
EOS ABS: 0 10*3/uL (ref 0.0–0.7)
EOS PCT: 0 %
HCT: 41.5 % (ref 36.0–46.0)
Hemoglobin: 14 g/dL (ref 12.0–15.0)
LYMPHS ABS: 2.6 10*3/uL (ref 0.7–4.0)
Lymphocytes Relative: 26 %
MCH: 31.3 pg (ref 26.0–34.0)
MCHC: 33.7 g/dL (ref 30.0–36.0)
MCV: 92.6 fL (ref 78.0–100.0)
MONOS PCT: 5 %
Monocytes Absolute: 0.6 10*3/uL (ref 0.1–1.0)
Neutro Abs: 6.9 10*3/uL (ref 1.7–7.7)
Neutrophils Relative %: 69 %
PLATELETS: 484 10*3/uL — AB (ref 150–400)
RBC: 4.48 MIL/uL (ref 3.87–5.11)
RDW: 13.8 % (ref 11.5–15.5)
WBC: 10.1 10*3/uL (ref 4.0–10.5)

## 2016-01-08 LAB — URINALYSIS, ROUTINE W REFLEX MICROSCOPIC
BILIRUBIN URINE: NEGATIVE
GLUCOSE, UA: NEGATIVE mg/dL
Hgb urine dipstick: NEGATIVE
KETONES UR: NEGATIVE mg/dL
Nitrite: POSITIVE — AB
PH: 6.5 (ref 5.0–8.0)
Protein, ur: NEGATIVE mg/dL

## 2016-01-08 LAB — COMPREHENSIVE METABOLIC PANEL
ALK PHOS: 107 U/L (ref 38–126)
ALT: 12 U/L — AB (ref 14–54)
AST: 16 U/L (ref 15–41)
Albumin: 4 g/dL (ref 3.5–5.0)
Anion gap: 13 (ref 5–15)
BUN: 6 mg/dL (ref 6–20)
CALCIUM: 9 mg/dL (ref 8.9–10.3)
CO2: 22 mmol/L (ref 22–32)
CREATININE: 0.69 mg/dL (ref 0.44–1.00)
Chloride: 109 mmol/L (ref 101–111)
Glucose, Bld: 110 mg/dL — ABNORMAL HIGH (ref 65–99)
Potassium: 3.6 mmol/L (ref 3.5–5.1)
Sodium: 144 mmol/L (ref 135–145)
Total Bilirubin: 0.4 mg/dL (ref 0.3–1.2)
Total Protein: 7.5 g/dL (ref 6.5–8.1)

## 2016-01-08 LAB — RAPID URINE DRUG SCREEN, HOSP PERFORMED
Amphetamines: NOT DETECTED
BARBITURATES: NOT DETECTED
BENZODIAZEPINES: POSITIVE — AB
Cocaine: POSITIVE — AB
Opiates: NOT DETECTED
TETRAHYDROCANNABINOL: NOT DETECTED

## 2016-01-08 LAB — SALICYLATE LEVEL

## 2016-01-08 LAB — URINE MICROSCOPIC-ADD ON: RBC / HPF: NONE SEEN RBC/hpf (ref 0–5)

## 2016-01-08 LAB — ETHANOL: Alcohol, Ethyl (B): <5 mg/dL (ref <5)

## 2016-01-08 LAB — ACETAMINOPHEN LEVEL

## 2016-01-08 LAB — PROTIME-INR
INR: 0.97 (ref 0.00–1.49)
PROTHROMBIN TIME: 13.1 s (ref 11.6–15.2)

## 2016-01-08 MED ORDER — NITROFURANTOIN MONOHYD MACRO 100 MG PO CAPS
100.0000 mg | ORAL_CAPSULE | Freq: Two times a day (BID) | ORAL | Status: DC
  Administered 2016-01-08: 100 mg via ORAL
  Filled 2016-01-08: qty 1

## 2016-01-08 MED ORDER — SODIUM CHLORIDE 0.9 % IV SOLN
INTRAVENOUS | Status: DC
Start: 2016-01-08 — End: 2016-01-09
  Administered 2016-01-08: 18:00:00 via INTRAVENOUS

## 2016-01-08 NOTE — Progress Notes (Signed)
Under review at [facility].  Melbourne Abts, LCSWA Disposition staff 01/08/2016 10:32 PM
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
## 2016-01-08 NOTE — ED Notes (Signed)
Pt admitted to another nurse of the dept. That she took an overdose on her seizure medication

## 2016-01-08 NOTE — ED Notes (Signed)
EMS states pt's son called for a possible overdose at 5 am today.  Son states that she took a handful and then went to sleep.  Pt has also been drinking heavily since waking up today.  Pt is not speaking at this time, but told ems she was going to urinate and proceeded to do so.

## 2016-01-08 NOTE — ED Provider Notes (Signed)
CSN: [id number]     Arrival date & time 01/08/16  1644 History   First MD Initiated Contact with Patient 01/08/16 1658     Chief Complaint  Patient presents with  . V70.1     The history is provided by a relative and the EMS personnel. The history is limited by the condition of the patient (Uncooperative, refuses to speak with staff).  Pt was seen at 1710. Per EMS and family report: Pt's son called EMS for pt due to intentional OD this morning at 0500. Son states pt "took a handful of pills" and then went to sleep. Endorses etoh use today also. Pt refuses to speak with EMS or ED staff, but will nod head/yes and no to questions. Pt does nod her head yes when asked if she took OD this morning.   Past Medical History  Diagnosis Date  . Anxiety   . Ruptured disc, thoracic   . History of panic attacks 09/11/2014  . Chronic back pain   . Depression   . Suicide attempt (HCC) 2002   Past Surgical History  Procedure Laterality Date  . Cesarean section    . Tubal ligation    . Clavicle surgery     Family History  Problem Relation Age of Onset  . Diabetes Mother   . Pulmonary fibrosis Mother   . Heart attack Father   . Parkinson's disease Sister   . Bipolar disorder Sister   . Thyroid disease Sister   . Heart attack Brother   . Other Son     vascular neucrosis  . Pulmonary fibrosis Maternal Grandmother   . Diabetes Maternal Grandfather   . Stroke Maternal Grandfather   . Pneumonia Paternal Grandfather   . Cancer Sister     melonoma  . Other Brother     back problems, nerve problems, soft bones   Social History  Substance Use Topics  . Smoking status: Current Every Day Smoker -- 0.50 packs/day    Types: Cigarettes  . Smokeless tobacco: Never Used  . Alcohol Use: Yes     Comment: occassional   OB History    Gravida Para Term Preterm AB TAB SAB Ectopic Multiple Living   2 1   1  1   1      Review of Systems  Unable to perform ROS: Other      Allergies   Ampicillin  Home Medications   Prior to Admission medications   Medication Sig Start Date End Date Taking? Authorizing Provider  BIOTIN PO Take 1 tablet by mouth daily.    Historical Provider, MD  CALCIUM PO Take by mouth daily.    Historical Provider, MD  diclofenac (VOLTAREN) 75 MG EC tablet Take 75 mg by mouth 2 (two) times daily.    Historical Provider, MD  fish oil-omega-3 fatty acids 1000 MG capsule Take 3 g by mouth 3 (three) times daily.     Historical Provider, MD  PARoxetine (PAXIL) 20 MG tablet daily.  08/07/14   Historical Provider, MD  tiZANidine (ZANAFLEX) 4 MG capsule Take 4 mg by mouth 2 (two) times daily.    Historical Provider, MD   BP 134/76 mmHg  Pulse 69  Resp 21  Ht 5\' 5"  (1.651 m)  Wt 130 lb (58.968 kg)  BMI 21.63 kg/m2  SpO2 98% Physical Exam  1715: Physical examination:  Nursing notes reviewed; Vital signs and O2 SAT reviewed;  Constitutional: Well developed, Well nourished, Well hydrated, In no acute distress; Head:  Normocephalic, atraumatic; Eyes: EOMI, PERRL, No scleral icterus; ENMT: Mouth and pharynx normal, Mucous membranes moist; Neck: Supple, Full range of motion, No lymphadenopathy; Cardiovascular: Regular rate and rhythm, No gallop; Respiratory: Breath sounds clear & equal bilaterally, No wheezes. Normal respiratory effort/excursion; Chest: Nontender, Movement normal; Abdomen: Soft, Nontender, Nondistended, Normal bowel sounds; Genitourinary: No CVA tenderness; Extremities: Pulses normal, No tenderness, No edema, No calf edema or asymmetry.; Neuro: Awake, alert, eyes open spontaneously. Will whisper one or two words then refuse to speak further. Moves all extremities spontaneously and to command without apparent gross focal motor deficits.; Skin: Color normal, Warm, Dry.   ED Course  Procedures (including critical care time) Labs Review   Imaging Review  I have personally reviewed and evaluated these images and lab results as part of my medical  decision-making.   EKG Interpretation   Date/Time:  Wednesday January 08 2016 16:53:47 EST Ventricular Rate:  71 PR Interval:  177 QRS Duration: 94 QT Interval:  404 QTC Calculation: 439 R Axis:   27 Text Interpretation:  Sinus rhythm Atrial premature complex Low voltage,  precordial leads Baseline wander No old tracing to compare Confirmed by  [facility]  MD, Nicholos Johns [phone]) on 01/08/2016 5:17:07 PM      MDM  MDM Reviewed: previous chart, nursing note and vitals Reviewed previous: labs Interpretation: labs and x-ray     Results for orders placed or performed during the hospital encounter of 01/08/16  CBC with Differential  Result Value Ref Range   WBC 10.1 4.0 - 10.5 K/uL   RBC 4.48 3.87 - 5.11 MIL/uL   Hemoglobin 14.0 12.0 - 15.0 g/dL   HCT 60.4 54.0 - 98.1 %   MCV 92.6 78.0 - 100.0 fL   MCH 31.3 26.0 - 34.0 pg   MCHC 33.7 30.0 - 36.0 g/dL   RDW 19.1 47.8 - 29.5 %   Platelets 484 (H) 150 - 400 K/uL   Neutrophils Relative % 69 %   Neutro Abs 6.9 1.7 - 7.7 K/uL   Lymphocytes Relative 26 %   Lymphs Abs 2.6 0.7 - 4.0 K/uL   Monocytes Relative 5 %   Monocytes Absolute 0.6 0.1 - 1.0 K/uL   Eosinophils Relative 0 %   Eosinophils Absolute 0.0 0.0 - 0.7 K/uL   Basophils Relative 0 %   Basophils Absolute 0.0 0.0 - 0.1 K/uL  Comprehensive metabolic panel  Result Value Ref Range   Sodium 144 135 - 145 mmol/L   Potassium 3.6 3.5 - 5.1 mmol/L   Chloride 109 101 - 111 mmol/L   CO2 22 22 - 32 mmol/L   Glucose, Bld 110 (H) 65 - 99 mg/dL   BUN 6 6 - 20 mg/dL   Creatinine, Ser 6.21 0.44 - 1.00 mg/dL   Calcium 9.0 8.9 - 30.8 mg/dL   Total Protein 7.5 6.5 - 8.1 g/dL   Albumin 4.0 3.5 - 5.0 g/dL   AST 16 15 - 41 U/L   ALT 12 (L) 14 - 54 U/L   Alkaline Phosphatase 107 38 - 126 U/L   Total Bilirubin 0.4 0.3 - 1.2 mg/dL   GFR calc non Af Amer >60 >60 mL/min   GFR calc Af Amer >60 >60 mL/min   Anion gap 13 5 - 15  Acetaminophen level  Result Value Ref Range   Acetaminophen  (Tylenol), Serum <10 (L) 10 - 30 ug/mL  Salicylate level  Result Value Ref Range   Salicylate Lvl <4.0 2.8 - 30.0 mg/dL  Ethanol  Result Value Ref Range   Alcohol, Ethyl (B) <5 <5 mg/dL  Urinalysis, Routine w reflex microscopic (not at [facility])  Result Value Ref Range   Color, Urine YELLOW YELLOW   APPearance HAZY (A) CLEAR   Specific Gravity, Urine <1.005 (L) 1.005 - 1.030   pH 6.5 5.0 - 8.0   Glucose, UA NEGATIVE NEGATIVE mg/dL   Hgb urine dipstick NEGATIVE NEGATIVE   Bilirubin Urine NEGATIVE NEGATIVE   Ketones, ur NEGATIVE NEGATIVE mg/dL   Protein, ur NEGATIVE NEGATIVE mg/dL   Nitrite POSITIVE (A) NEGATIVE   Leukocytes, UA TRACE (A) NEGATIVE  Urine rapid drug screen (hosp performed)  Result Value Ref Range   Opiates NONE DETECTED NONE DETECTED   Cocaine POSITIVE (A) NONE DETECTED   Benzodiazepines POSITIVE (A) NONE DETECTED   Amphetamines NONE DETECTED NONE DETECTED   Tetrahydrocannabinol NONE DETECTED NONE DETECTED   Barbiturates NONE DETECTED NONE DETECTED  Protime-INR  Result Value Ref Range   Prothrombin Time 13.1 11.6 - 15.2 seconds   INR 0.97 0.00 - 1.49  Urine microscopic-add on  Result Value Ref Range   Squamous Epithelial / LPF 0-5 (A) NONE SEEN   WBC, UA 6-30 0 - 5 WBC/hpf   RBC / HPF NONE SEEN 0 - 5 RBC/hpf   Bacteria, UA MANY (A) NONE SEEN   Dg Chest Port 1 View 01/08/2016  CLINICAL DATA:  Overdose. Pt is awake but unable to communicate. Hx smoker, anxiety EXAM: PORTABLE CHEST 1 VIEW COMPARISON:  07/25/2014 FINDINGS: Normal mediastinum and cardiac silhouette. Normal pulmonary vasculature. Mild in interstitial peripheral pattern. No evidence of effusion, infiltrate, or pneumothorax. No acute bony abnormality. IMPRESSION: Mild interstitial edema pattern. Electronically Signed   By: Genevive Bi M.D.   On: 01/08/2016 17:46    1830:  +UTI, UC pending; will tx with macrobid. Pt continues to refuse to speak with ED staff regarding events today. Pt will nod head  yes/no to my questions. Does admit she took OD of pills today. TTS consult pending.   Samuel Jester, DO 01/08/16 2148

## 2016-01-09 ENCOUNTER — Encounter (HOSPITAL_COMMUNITY): Payer: Self-pay

## 2016-01-09 ENCOUNTER — Inpatient Hospital Stay (HOSPITAL_COMMUNITY)
Admission: EM | Admit: 2016-01-09 | Discharge: 2016-01-10 | DRG: 897 | Disposition: A | Discharge status: Home / Self Care | Payer: BLUE CROSS/BLUE SHIELD | Source: Intra-hospital | Attending: Psychiatry | Admitting: Psychiatry

## 2016-01-09 DIAGNOSIS — Z915 Personal history of self-harm: Secondary | ICD-10-CM | POA: Diagnosis not present

## 2016-01-09 DIAGNOSIS — F329 Major depressive disorder, single episode, unspecified: Secondary | ICD-10-CM | POA: Diagnosis present

## 2016-01-09 DIAGNOSIS — F17210 Nicotine dependence, cigarettes, uncomplicated: Secondary | ICD-10-CM | POA: Diagnosis present

## 2016-01-09 DIAGNOSIS — F1320 Sedative, hypnotic or anxiolytic dependence, uncomplicated: Secondary | ICD-10-CM | POA: Diagnosis present

## 2016-01-09 DIAGNOSIS — G4700 Insomnia, unspecified: Secondary | ICD-10-CM | POA: Diagnosis present

## 2016-01-09 HISTORY — DX: Other specified health status: Z78.9

## 2016-01-09 MED ORDER — OMEGA-3 FATTY ACIDS 1000 MG PO CAPS
3.0000 g | ORAL_CAPSULE | Freq: Three times a day (TID) | ORAL | Status: DC
  Filled 2016-01-09 (×3): qty 3

## 2016-01-09 MED ORDER — NICOTINE 21 MG/24HR TD PT24
21.0000 mg | MEDICATED_PATCH | Freq: Every day | TRANSDERMAL | Status: DC
  Administered 2016-01-10: 21 mg via TRANSDERMAL
  Filled 2016-01-09 (×4): qty 1

## 2016-01-09 MED ORDER — ACETAMINOPHEN 325 MG PO TABS
650.0000 mg | ORAL_TABLET | Freq: Once | ORAL | Status: AC
  Administered 2016-01-09: 650 mg via ORAL
  Filled 2016-01-09: qty 2

## 2016-01-09 MED ORDER — NITROFURANTOIN MONOHYD MACRO 100 MG PO CAPS
100.0000 mg | ORAL_CAPSULE | Freq: Two times a day (BID) | ORAL | Status: DC
  Administered 2016-01-09 – 2016-01-10 (×3): 100 mg via ORAL
  Filled 2016-01-09 (×3): qty 1
  Filled 2016-01-09: qty 12
  Filled 2016-01-09: qty 1
  Filled 2016-01-09: qty 12
  Filled 2016-01-09: qty 1

## 2016-01-09 MED ORDER — CALCIUM CARBONATE-VITAMIN D 500-200 MG-UNIT PO TABS
1.0000 | ORAL_TABLET | Freq: Every day | ORAL | Status: DC
  Administered 2016-01-10: 1 via ORAL
  Filled 2016-01-09: qty 7
  Filled 2016-01-09 (×2): qty 1

## 2016-01-09 MED ORDER — OMEGA-3-ACID ETHYL ESTERS 1 G PO CAPS
1.0000 g | ORAL_CAPSULE | Freq: Every day | ORAL | Status: DC
  Administered 2016-01-09 – 2016-01-10 (×2): 1 g via ORAL
  Filled 2016-01-09 (×2): qty 1
  Filled 2016-01-09: qty 7
  Filled 2016-01-09: qty 1

## 2016-01-09 MED ORDER — CHLORDIAZEPOXIDE HCL 25 MG PO CAPS
25.0000 mg | ORAL_CAPSULE | Freq: Four times a day (QID) | ORAL | Status: AC
  Administered 2016-01-09 – 2016-01-10 (×4): 25 mg via ORAL
  Filled 2016-01-09 (×4): qty 1

## 2016-01-09 MED ORDER — TRAZODONE HCL 100 MG PO TABS
100.0000 mg | ORAL_TABLET | Freq: Every evening | ORAL | Status: DC | PRN
  Administered 2016-01-09: 100 mg via ORAL
  Filled 2016-01-09: qty 7
  Filled 2016-01-09: qty 1

## 2016-01-09 MED ORDER — ALUM & MAG HYDROXIDE-SIMETH 200-200-20 MG/5ML PO SUSP: 30.0000 mL | ORAL | Status: DC | PRN

## 2016-01-09 MED ORDER — HYDROXYZINE HCL 25 MG PO TABS
25.0000 mg | ORAL_TABLET | Freq: Four times a day (QID) | ORAL | Status: DC | PRN
  Administered 2016-01-09: 25 mg via ORAL
  Filled 2016-01-09: qty 10
  Filled 2016-01-09: qty 1

## 2016-01-09 MED ORDER — LOPERAMIDE HCL 2 MG PO CAPS: 2.0000 mg | ORAL_CAPSULE | ORAL | Status: DC | PRN

## 2016-01-09 MED ORDER — THIAMINE HCL 100 MG/ML IJ SOLN: 100.0000 mg | Freq: Once | INTRAMUSCULAR | Status: DC

## 2016-01-09 MED ORDER — CHLORDIAZEPOXIDE HCL 25 MG PO CAPS: 25.0000 mg | ORAL_CAPSULE | ORAL | Status: DC

## 2016-01-09 MED ORDER — CHLORDIAZEPOXIDE HCL 25 MG PO CAPS: 25.0000 mg | ORAL_CAPSULE | Freq: Four times a day (QID) | ORAL | Status: DC | PRN

## 2016-01-09 MED ORDER — VITAMIN B-1 100 MG PO TABS
100.0000 mg | ORAL_TABLET | Freq: Every day | ORAL | Status: DC
  Administered 2016-01-10: 100 mg via ORAL
  Filled 2016-01-09 (×3): qty 1

## 2016-01-09 MED ORDER — MAGNESIUM HYDROXIDE 400 MG/5ML PO SUSP: 30.0000 mL | Freq: Every day | ORAL | Status: DC | PRN

## 2016-01-09 MED ORDER — CHLORDIAZEPOXIDE HCL 25 MG PO CAPS
25.0000 mg | ORAL_CAPSULE | Freq: Three times a day (TID) | ORAL | Status: DC
  Administered 2016-01-10: 25 mg via ORAL
  Filled 2016-01-09: qty 1

## 2016-01-09 MED ORDER — ONDANSETRON 4 MG PO TBDP
4.0000 mg | ORAL_TABLET | Freq: Four times a day (QID) | ORAL | Status: DC | PRN
Start: 2016-01-09 — End: 2016-01-10

## 2016-01-09 MED ORDER — IBUPROFEN 400 MG PO TABS
400.0000 mg | ORAL_TABLET | Freq: Four times a day (QID) | ORAL | Status: DC | PRN
  Administered 2016-01-09: 400 mg via ORAL
  Filled 2016-01-09: qty 1

## 2016-01-09 MED ORDER — ACETAMINOPHEN 325 MG PO TABS
650.0000 mg | ORAL_TABLET | Freq: Four times a day (QID) | ORAL | Status: DC | PRN
  Administered 2016-01-09: 650 mg via ORAL
  Filled 2016-01-09: qty 2

## 2016-01-09 MED ORDER — ADULT MULTIVITAMIN W/MINERALS CH
1.0000 | ORAL_TABLET | Freq: Every day | ORAL | Status: DC
  Administered 2016-01-09 – 2016-01-10 (×2): 1 via ORAL
  Filled 2016-01-09 (×4): qty 1

## 2016-01-09 MED ORDER — CHLORDIAZEPOXIDE HCL 25 MG PO CAPS: 25.0000 mg | ORAL_CAPSULE | Freq: Every day | ORAL | Status: DC

## 2016-01-09 MED ORDER — NITROFURANTOIN MONOHYD MACRO 100 MG PO CAPS: 100.0000 mg | ORAL_CAPSULE | Freq: Two times a day (BID) | ORAL | Status: DC

## 2016-01-09 NOTE — ED Notes (Signed)
Report given to [facility] at [facility]. Awaiting [facility] at this time for transport to facility.
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
## 2016-01-09 NOTE — Progress Notes (Signed)
Assumed care of patient from admitting nurse. Patient continues to state she feels "foggy and shaky" though it has improved this afternoon. VSS, CIWA 3-4 today. She complained of a headache mid afternoon which resolved with tylenol. Medicated per orders, patient on librium protocol. Emotional support offered. She denies SI/HI and remains safe on level III obs. Lawrence Marseilles
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
## 2016-01-09 NOTE — ED Notes (Signed)
Voluntary Admission form signed by pt and faxed to [facility] at [phone]

## 2016-01-09 NOTE — Progress Notes (Signed)
[facility] Group Notes:  (Nursing/MHT/Case Management/Adjunct)  Date:  01/09/2016  Time:  2100  Type of Therapy:  Wrap up group  Participation Level:  Active  Participation Quality:  Appropriate, Attentive, Sharing and Supportive  Affect:  Appropriate  Cognitive:  Appropriate  Insight:  Improving  Engagement in Group:  Engaged  Modes of Intervention:  Clarification, Education and Support  Summary of Progress/Problems: Pt shares that her living arrangement with her son and his girlfriend are her biggest stressors. Pt has made a decision to ask both to leave her home upon discharge. Pt reports her overdose to be accidental and that she did not even know her doctor had her prescribed medicine used for seizures and bipolar medicine. Pt states she messed up and it wont happen again because she knows herself and wont let it.   Johann Capers S 01/09/2016, 10:14 PM

## 2016-01-09 NOTE — Tx Team (Signed)
Initial Interdisciplinary Treatment Plan   PATIENT STRESSORS: Financial difficulties Loss of mother and brother (deaths) Medication change or noncompliance Occupational concerns Substance abuse   PATIENT STRENGTHS: Ability for insight Active sense of humor Average or above average intelligence Capable of independent living Licensed conveyancer Supportive family/friends   PROBLEM LIST: Problem List/Patient Goals Date to be addressed Date deferred Reason deferred Estimated date of resolution  " I've been depressed but I'm not suicidal" 01/09/16     " I need to get on a different medication" 01/09/16     " I need to stay away from certain people that has caused a relapse" 01/09/16                                          DISCHARGE CRITERIA:  Ability to meet basic life and health needs Adequate post-discharge living arrangements Improved stabilization in mood, thinking, and/or behavior Verbal commitment to aftercare and medication compliance Withdrawal symptoms are absent or subacute and managed without 24-hour nursing intervention  PRELIMINARY DISCHARGE PLAN: Outpatient therapy Return to previous living arrangement Return to previous work or school arrangements  PATIENT/FAMIILY INVOLVEMENT: This treatment plan has been presented to and reviewed with the patient, Casey [name], and/or family member, .  The patient and family have been given the opportunity to ask questions and make suggestions.  Casey [name] [facility] 01/09/2016, 10:29 AM
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
## 2016-01-10 MED ORDER — NITROFURANTOIN MONOHYD MACRO 100 MG PO CAPS: 100.0000 mg | ORAL_CAPSULE | Freq: Two times a day (BID) | ORAL | Status: DC

## 2016-01-10 MED ORDER — TRAZODONE HCL 100 MG PO TABS: 100.0000 mg | ORAL_TABLET | Freq: Every evening | ORAL | Status: DC | PRN

## 2016-01-10 MED ORDER — OMEGA-3-ACID ETHYL ESTERS 1 G PO CAPS: 1.0000 g | ORAL_CAPSULE | Freq: Every day | ORAL | Status: DC

## 2016-01-10 MED ORDER — HYDROXYZINE HCL 25 MG PO TABS: ORAL_TABLET | ORAL | Status: DC

## 2016-01-10 MED ORDER — CALCIUM CARBONATE-VITAMIN D 500-200 MG-UNIT PO TABS: 1.0000 | ORAL_TABLET | Freq: Every day | ORAL | Status: DC

## 2016-01-10 MED ORDER — IBUPROFEN 200 MG PO TABS: 400.0000 mg | ORAL_TABLET | Freq: Four times a day (QID) | ORAL | Status: DC | PRN

## 2016-01-10 MED ORDER — CHLORDIAZEPOXIDE HCL 25 MG PO CAPS: ORAL_CAPSULE | ORAL | Status: DC
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
## 2016-01-10 NOTE — Progress Notes (Signed)
D: Pt presents with anxious affect and mood.  Pt reports she feels "overwhelmed" and that her goal is to "rest, I haven't slept in about 3 days."  Pt denies SI/HI, denies hallucinations, reports pain from headache of 6/10.  Pt has been visible in milieu interacting with peers and staff appropriately.  Pt attended evening group.   A: Introduced self to pt.  Actively listened to pt and offered support and encouragement.  Medications administered per order.  Medication education provided.  PRN medication administered for anxiety, sleep, and pain. R: Pt is compliant with medications.  Pt verbally contracts for safety and reports that she will inform staff of needs and concerns.  Will continue to monitor and assess.

## 2016-01-10 NOTE — Progress Notes (Addendum)
  [facility] :  Will you be returning to the same living situation after discharge:  Yes,  home At discharge, do you have transportation home?: Yes,  family member at 1pm Do you have the ability to pay for your medications: Yes,  [employer] private insurance  Release of information consent forms completed and submitted to medical records by CSW.  Patient to Follow up at: Follow-up Information    Follow up with [facility] ([facility]).   Why:  The office requests that you call at discharge to arrange appt time and date. Deposit required prior to making appts. ($150 for new patients).    Contact information:   ATTN: Dr. Betti Cruzeddy [address] [place], [place] [address] Phone: [phone] Fax: [phone]      Next level of care provider [facility] Link:no  Safety Planning and Suicide Prevention discussed: Yes,  SPE completed with pt, as she did not consent to family contact.  Have you used any form of tobacco in the last 30 days? (Cigarettes, Smokeless Tobacco, Cigars, and/or Pipes): Yes  Has patient been referred to the Quitline?: Patient refused referral  Patient has been referred for addiction treatment: Yes  Smart,  LCSW 01/10/2016, 9:43 AM

## 2016-01-10 NOTE — Plan of Care (Signed)
Problem: Alteration in mood & ability to function due to Goal: STG-Patient will attend groups Outcome: Progressing Pt attended evening group on 01/09/16     

## 2016-01-10 NOTE — Progress Notes (Signed)
Recreation Therapy Notes  Date: 03.03.2017 Time: 9:30am Location: 300 [address] Group Room   Group Topic: Stress Management  Goal Area(s) Addresses:  Patient will actively participate in stress management techniques presented during session.   Behavioral Response: Engaged, Attentive, Appropriate   Intervention: Stress management techniques  Activity :  Deep Breathing and Guided Imagery. LRT provided education, instruction and demonstration on practice of Deep Breathing and Guided Imagery. Patient was asked to participate in technique introduced during session.   Education:  Stress Management, Discharge Planning.   Education Outcome: Acknowledges education  Clinical Observations/Feedback: Patient actively engaged in technique introduced, expressed no concerns and demonstrated ability to practice independently post d/c.    L , LRT/CTRS  ,  L 01/10/2016 10:18 AM 
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
## 2016-01-10 NOTE — [facility] Suicide Risk Assessment (Signed)
[facility] INPATIENT:  Family/Significant Other Suicide Prevention Education  Suicide Prevention Education:  Patient Refusal for Family/Significant Other Suicide Prevention Education: The patient Casey [name] has refused to provide written consent for family/significant other to be provided Family/Significant Other Suicide Prevention Education during admission and/or prior to discharge.  Physician notified.  SPE completed with pt, as pt refused to consent to family contact. SPI pamphlet provided to pt and pt was encouraged to share information with support network, ask questions, and talk about any concerns relating to SPE. Pt denies access to guns/firearms and verbalized understanding of information provided. Mobile Crisis information also provided to pt.     Smart,  LCSW 01/10/2016, 8:32 AM

## 2016-01-10 NOTE — Progress Notes (Signed)
Pt discharged home with her son. Pt was ambulatory stable and appreciative at that time. All papers and prescriptions were given and valuables returned. Verbal understanding expressed. Denies SI/HI and A/VH. Pt given opportunity to express concerns and ask questions.

## 2016-01-10 NOTE — [facility] Suicide Risk Assessment (Signed)
[facility]   Principal Problem: <principal problem not specified> Discharge Diagnoses:  Patient Active Problem List   Diagnosis Date Noted  . Benzodiazepine dependence, continuous (HCC) [F13.20] 01/09/2016  . History of panic attacks [Z86.59] 09/11/2014  . Malaise [R53.81] 09/03/2014  . Fatigue [R53.83] 09/03/2014  . Body mass index (BMI) of 20.0-20.9 in adult [Z68.20] 09/03/2014  . Right clavicle fracture [S42.001A] 12/12/2013  . Clavicle fracture [S42.009A] 11/07/2013    Total Time spent with patient: 20 minutes  Musculoskeletal: Strength & Muscle Tone: within normal limits Gait & Station: normal Patient leans: normal  Psychiatric Specialty Exam: Review of Systems  Constitutional: Negative.   HENT: Negative.   Eyes: Negative.   Respiratory: Negative.   Cardiovascular: Negative.   Gastrointestinal: Negative.   Genitourinary: Negative.   Musculoskeletal: Negative.   Skin: Negative.   Neurological: Negative.   Endo/Heme/Allergies: Negative.   Psychiatric/Behavioral: Positive for substance abuse.    Blood pressure 152/66, pulse 73, temperature 97.6 F (36.4 C), temperature source Oral, resp. rate 16, height 5' 5.5" (1.664 m), weight 61.644 kg (135 lb 14.4 oz).Body mass index is 22.26 kg/(m^2).  General Appearance: Fairly Groomed  Patent attorneyye Contact::  Fair  Speech:  Clear and Coherent409  Volume:  Normal  Mood:  Euthymic  Affect:  Restricted  Thought Process:  Coherent and Goal Directed  Orientation:  Full (Time, Place, and Person)  Thought Content:  plans as she goes, relapse prevention plan  Suicidal Thoughts:  No  Homicidal Thoughts:  No  Memory:  Immediate;   Fair Recent;   Fair Remote;   Fair  Judgement:  Fair  Insight:  Present  Psychomotor Activity:  Normal  Concentration:  Fair  Recall:  FiservFair  Fund of Knowledge:Fair  Language: Fair  Akathisia:  No  Handed:  Right  AIMS (if indicated):     Assets:  Desire for  Improvement Housing Transportation Vocational/Educational  Sleep:  Number of Hours: 6.75  Cognition: WNL  ADL's:  Intact  In full contact with reality. There are no active S/S of withdrawal. There are no active SI plans or intent. States she is committed to stay away from the Xanax. She is going to pursue treatment for her depression and anxiety Mental Status Per Nursing Assessment::   On Admission:  Self-harm behaviors  Demographic Factors:  Caucasian  Loss Factors: Decline in physical health  Historical Factors: none identified  Risk Reduction Factors:   Sense of responsibility to family, Employed and Positive social support  Continued Clinical Symptoms:  Depression:   Comorbid alcohol abuse/dependence Alcohol/Substance Abuse/Dependencies  Cognitive Features That Contribute To Risk:  None    Suicide Risk:  Minimal: No identifiable suicidal ideation.  Patients presenting with no risk factors but with morbid ruminations; may be classified as minimal risk based on the severity of the depressive symptoms  Follow-up Information    Follow up with [facility] ([facility]).   Why:  The office requests that you call at discharge to arrange appt time and date. Deposit required prior to making appts. ($150 for new patients).    Contact information:   ATTN: Dr. Betti Cruzeddy [address] [place], [place] [address] Phone: [phone] Fax: [phone]      Plan Of Care/Follow-up recommendations:  Activity:  as tolerated Diet:  regular Follow up as above , A, MD 01/10/2016, 10:12 AM

## 2016-01-11 LAB — URINE CULTURE: Culture: >=100000

## 2016-01-12 ENCOUNTER — Telehealth (HOSPITAL_COMMUNITY): Payer: Self-pay

## 2016-01-12 NOTE — Telephone Encounter (Signed)
Post ED Visit - Positive Culture Follow-up  Culture report reviewed by antimicrobial stewardship pharmacist:  []  Enzo BiNathan Batchelder, Pharm.D. []  Celedonio MiyamotoJeremy Frens, Pharm.D., BCPS []  Garvin FilaMike Maccia, Pharm.D. []  Georgina PillionElizabeth Martin, Pharm.D., BCPS []  [place]stownMinh Pham, [address]Ph[address]D., BCPS, AAHIVP []  Estella HuskMichelle Turner, Pharm.D., BCPS, AAHIVP [x]  Tennis Mustassie Stewart, [address]Pharm[address]D. []  Sherle Poeob Vincent, [address]Ph[address]  Positive urine culture Treated with macrobid, organism sensitive to the same and no further patient follow-up is required at this time.  Ashley JacobsFesterman,  C 01/12/2016, 10:45 AM

## 2016-03-11 ENCOUNTER — Encounter (HOSPITAL_COMMUNITY): Payer: Self-pay | Admitting: Emergency Medicine

## 2016-03-11 ENCOUNTER — Emergency Department (HOSPITAL_COMMUNITY): Payer: BLUE CROSS/BLUE SHIELD

## 2016-03-11 ENCOUNTER — Emergency Department (HOSPITAL_COMMUNITY)
Admission: EM | Admit: 2016-03-11 | Discharge: 2016-03-11 | Disposition: A | Discharge status: Home / Self Care | Payer: BLUE CROSS/BLUE SHIELD | Attending: Emergency Medicine | Admitting: Emergency Medicine

## 2016-03-11 DIAGNOSIS — Y999 Unspecified external cause status: Secondary | ICD-10-CM | POA: Insufficient documentation

## 2016-03-11 DIAGNOSIS — F329 Major depressive disorder, single episode, unspecified: Secondary | ICD-10-CM | POA: Diagnosis not present

## 2016-03-11 DIAGNOSIS — Y939 Activity, unspecified: Secondary | ICD-10-CM | POA: Insufficient documentation

## 2016-03-11 DIAGNOSIS — S8002XA Contusion of left knee, initial encounter: Secondary | ICD-10-CM

## 2016-03-11 DIAGNOSIS — M549 Dorsalgia, unspecified: Secondary | ICD-10-CM | POA: Diagnosis not present

## 2016-03-11 DIAGNOSIS — S80912A Unspecified superficial injury of left knee, initial encounter: Secondary | ICD-10-CM | POA: Diagnosis present

## 2016-03-11 DIAGNOSIS — W2203XA Walked into furniture, initial encounter: Secondary | ICD-10-CM | POA: Insufficient documentation

## 2016-03-11 DIAGNOSIS — Y929 Unspecified place or not applicable: Secondary | ICD-10-CM | POA: Diagnosis not present

## 2016-03-11 DIAGNOSIS — F17210 Nicotine dependence, cigarettes, uncomplicated: Secondary | ICD-10-CM | POA: Diagnosis not present

## 2016-03-11 MED ORDER — KETOROLAC TROMETHAMINE 10 MG PO TABS
10.0000 mg | ORAL_TABLET | Freq: Once | ORAL | Status: AC
  Administered 2016-03-11: 10 mg via ORAL
  Filled 2016-03-11: qty 1

## 2016-03-11 MED ORDER — DEXAMETHASONE 4 MG PO TABS: 4.0000 mg | ORAL_TABLET | Freq: Two times a day (BID) | ORAL | Status: DC

## 2016-03-11 MED ORDER — DEXAMETHASONE SODIUM PHOSPHATE 4 MG/ML IJ SOLN
8.0000 mg | Freq: Once | INTRAMUSCULAR | Status: AC
  Administered 2016-03-11: 8 mg via INTRAMUSCULAR
  Filled 2016-03-11: qty 2

## 2016-03-11 MED ORDER — PROMETHAZINE HCL 12.5 MG PO TABS
12.5000 mg | ORAL_TABLET | Freq: Once | ORAL | Status: AC
  Administered 2016-03-11: 12.5 mg via ORAL
  Filled 2016-03-11: qty 1

## 2016-03-11 MED ORDER — DICLOFENAC SODIUM 75 MG PO TBEC: 75.0000 mg | DELAYED_RELEASE_TABLET | Freq: Two times a day (BID) | ORAL | Status: DC

## 2016-03-11 NOTE — ED Notes (Signed)
Patients visitor walking up and down hallway looking in different rooms. Explained to patient that she cannot walk around looking in different rooms, due to patient privacy. Asked patient to return to her room or wait in the waiting area.  Patient visitor states she was wanting some crackers and a drink for patient. This nurse provided another (5th drink for this room) and two packs of crackers. When taken into patients room, patient asked if she could get a survey based on her care today. I explained to the patient that the department does call surveys.

## 2016-03-11 NOTE — ED Provider Notes (Signed)
CSN: [id number]     Arrival date & time 03/11/16  1651 History   First MD Initiated Contact with Patient 03/11/16 1706     Chief Complaint  Patient presents with  . Knee Pain     (Consider location/radiation/quality/duration/timing/severity/associated sxs/prior Treatment) HPI Comments: Patient is a 55 year old female who presents to the emergency department with a complaint of left knee pain.  The patient states that on April 30 she hit her left knee on a piece of furniture. The following morning she noted that she had severe pain involving the left knee. She states that she continued to get around on it and even go to work because she could not afford any more points against her on in order to preserve her job. Today she states the pain is much worse on, and she presents for assistance with her discomfort. She states that she could not see her primary physician, because she had been "red boxed" because of a problem she had a couple years ago. She has been applying ice, and the swelling has improved, but the pain continues to be an issue.  Patient is a 55 y.o. female presenting with knee pain. The history is provided by the patient.  Knee Pain Associated symptoms: back pain     Past Medical History  Diagnosis Date  . Anxiety   . Ruptured disc, thoracic   . History of panic attacks 09/11/2014  . Chronic back pain   . Depression   . Suicide attempt (HCC) 2002  . Medical history non-contributory    Past Surgical History  Procedure Laterality Date  . Cesarean section    . Tubal ligation    . Clavicle surgery     Family History  Problem Relation Age of Onset  . Diabetes Mother   . Pulmonary fibrosis Mother   . Heart attack Father   . Parkinson's disease Sister   . Bipolar disorder Sister   . Thyroid disease Sister   . Heart attack Brother   . Other Son     vascular neucrosis  . Pulmonary fibrosis Maternal Grandmother   . Diabetes Maternal Grandfather   . Stroke Maternal  Grandfather   . Pneumonia Paternal Grandfather   . Cancer Sister     melonoma  . Other Brother     back problems, nerve problems, soft bones   Social History  Substance Use Topics  . Smoking status: Current Every Day Smoker -- 0.50 packs/day    Types: Cigarettes  . Smokeless tobacco: Never Used  . Alcohol Use: Yes     Comment: occassional   OB History    Gravida Para Term Preterm AB TAB SAB Ectopic Multiple Living   Review of Systems  Musculoskeletal: Positive for back pain and arthralgias.  Psychiatric/Behavioral: The patient is nervous/anxious.        Depression  All other systems reviewed and are negative.     Allergies  Ampicillin  Home Medications   Prior to Admission medications   Medication Sig Start Date End Date Taking? Authorizing Provider  calcium-vitamin D (OSCAL WITH D) 500-200 MG-UNIT tablet Take 1 tablet by mouth daily with breakfast. For bone health 01/10/16   Sanjuana Kava, NP  chlordiazePOXIDE (LIBRIUM) 25 MG capsule Take 1 tablet (25 mg) three times daily (3 capsules) on: 01-10-16: For Benzodiazepine withdrawal symptoms. Take 1 tablet (25 mg) two times daily (2 capsules) on: 01-11-16: For Benzodiazepine withdrawal  symptoms. Take 1 tablet (25 mg) one time daily(1 capsules)  on: 01-12-16: For Benzodiazepine withdrawal symptoms. 01/10/16   Sanjuana KavaAgnes I Nwoko, NP  hydrOXYzine (ATARAX/VISTARIL) 25 MG tablet Take 1 tablet (25 mg) three times a day as needed: For anxiety 01/10/16   Rachael FeeIrving A Lugo, MD  ibuprofen (ADVIL,MOTRIN) 200 MG tablet Take 2 tablets (400 mg total) by mouth every 6 (six) hours as needed for headache. 01/10/16   Sanjuana KavaAgnes I Nwoko, NP  nitrofurantoin, macrocrystal-monohydrate, (MACROBID) 100 MG capsule Take 1 capsule (100 mg total) by mouth every 12 (twelve) hours. (For 6 more days): For urinary tract infection. 01/10/16   Sanjuana KavaAgnes I Nwoko, NP  omega-3 acid ethyl esters (LOVAZA) 1 g capsule Take 1 capsule (1 g total) by mouth daily before  breakfast. For high cholesterol 01/10/16   Sanjuana KavaAgnes I Nwoko, NP  traZODone (DESYREL) 100 MG tablet Take 1 tablet (100 mg total) by mouth at bedtime as needed for sleep. 01/10/16   Sanjuana KavaAgnes I Nwoko, NP   BP 117/68 mmHg  Pulse 76  Temp(Src) 99.1 F (37.3 C) (Temporal)  Resp 16  Ht 5\' 5"  (1.651 m)  Wt 62.596 kg  BMI 22.96 kg/m2  SpO2 98%  LMP  (LMP Unknown) Physical Exam  Constitutional: She is oriented to person, place, and time. She appears well-developed and well-nourished.  Non-toxic appearance.  HENT:  Head: Normocephalic.  Right Ear: Tympanic membrane and external ear normal.  Left Ear: Tympanic membrane and external ear normal.  Eyes: EOM and lids are normal. Pupils are equal, round, and reactive to light.  Neck: Normal range of motion. Neck supple. Carotid bruit is not present.  Cardiovascular: Normal rate, regular rhythm, normal heart sounds, intact distal pulses and normal pulses.   Pulmonary/Chest: Breath sounds normal. No respiratory distress.  Abdominal: Soft. Bowel sounds are normal. There is no tenderness. There is no guarding.  Musculoskeletal: Normal range of motion.  There is good range of motion of the left hip. There is no deformity of the left thigh. There's no deformity of the quadricep area. The patella is in the midline. There is no effusion appreciated. There is a bruise near the anterior tibial tuberosity. There is pain with flexion and extension of the left knee. There is no deformity of the left ankle. The Achilles tendon is intact. The dorsalis pedis pulses 2+.  Lymphadenopathy:       Head (right side): No submandibular adenopathy present.       Head (left side): No submandibular adenopathy present.    She has no cervical adenopathy.  Neurological: She is alert and oriented to person, place, and time. She has normal strength. No cranial nerve deficit or sensory deficit.  Skin: Skin is warm and dry.  Psychiatric: She has a normal mood and affect. Her speech is normal.   Nursing note and vitals reviewed.   ED Course  Procedures (including critical care time) Labs Review Labs Reviewed - No data to display  Imaging Review No results found. I have personally reviewed and evaluated these images and lab results as part of my medical decision-making.   EKG Interpretation None      MDM  I discussed the findings with the patient terms which he understands. The plan at this time is for the patient to receive a knee immobilizer, Toradol, and Decadron. Patient will also be provided an ice pack. The patient states that she does not feel she can work with a knee immobilizer, and she does not feel that the  medications ordered will help any with her pain. She requests to see the attending physician over me.  Patient seen by Dr. Ranae Palms. Tib-fib x-ray ordered.   Patient initially refused medications, but she has now decided to except the Decadron Toradol promethazine and ice pack. 6:36 PM.   Tib-fib x-ray is negative for fracture or dislocation. Prescription for Decadron and Voltaren given to the patient. Patient is to follow-up with orthopedics if additional management is needed.    Final diagnoses:  Contusion of left knee, initial encounter    *I have reviewed nursing notes, vital signs, and all appropriate lab and imaging results for this patient.[address] PA-C 03/12/16 1903  Loren Racer, MD 03/18/16 [phone]

## 2016-03-11 NOTE — ED Notes (Signed)
Patient verbalizes understanding of discharge instructions, home care and follow up care. Patient out of department at this time. 

## 2016-03-11 NOTE — Discharge Instructions (Signed)
Your examination is negative for any vascular or neurologic problem. Your x-ray is negative for fracture or dislocation. Please use Decadron 2 times daily. Please use diclofenac 2 times daily with food. Please see Dr.Hasanaj 3 additional pain or problem.

## 2016-03-11 NOTE — ED Notes (Signed)
Pt reports woke up Monday morning with left knee pain. Pt reports hit left knee on autumn. Pt reports pain ever since. Pt reports increase in pain over last several days. nad noted.

## 2016-10-03 ENCOUNTER — Encounter: Payer: Self-pay | Admitting: Orthopaedic Surgery

## 2016-10-06 ENCOUNTER — Ambulatory Visit (INDEPENDENT_AMBULATORY_CARE_PROVIDER_SITE_OTHER): Payer: BLUE CROSS/BLUE SHIELD | Admitting: Orthopaedic Surgery

## 2016-10-06 ENCOUNTER — Encounter: Payer: Self-pay | Admitting: Orthopaedic Surgery

## 2016-10-06 VITALS — BP 124/77 | HR 75 | Temp 98.1°F | Ht 66.0 in | Wt 146.0 lb

## 2016-10-06 DIAGNOSIS — S62346A Nondisplaced fracture of base of fifth metacarpal bone, right hand, initial encounter for closed fracture: Secondary | ICD-10-CM

## 2016-10-06 DIAGNOSIS — F17209 Nicotine dependence, unspecified, with unspecified nicotine-induced disorders: Secondary | ICD-10-CM

## 2016-10-06 DIAGNOSIS — Z716 Tobacco abuse counseling: Secondary | ICD-10-CM | POA: Diagnosis not present

## 2016-10-06 DIAGNOSIS — F17210 Nicotine dependence, cigarettes, uncomplicated: Secondary | ICD-10-CM

## 2016-10-06 DIAGNOSIS — Z720 Tobacco use: Secondary | ICD-10-CM

## 2016-10-06 DIAGNOSIS — S62316A Displaced fracture of base of fifth metacarpal bone, right hand, initial encounter for closed fracture: Secondary | ICD-10-CM | POA: Insufficient documentation

## 2016-10-06 MED ORDER — HYDROCODONE-ACETAMINOPHEN 5-325 MG PO TABS: 1.0000 | ORAL_TABLET | ORAL | 0 refills | Status: DC | PRN

## 2016-10-06 NOTE — Progress Notes (Signed)
Subjective: I fell and broke my right hand    Patient ID: Casey [name], female    DOB: [birth year], 55 y.o.   MRN: [id number]  HPI She fell and hurt her right hand on 10-03-16.  She fell as her right knee gave way.  She has no other injury. [facility].  She had x-rays showing a fracture of the base of her fifth metacarpal nondisplaced.  I have reviewed the ER records and the x-rays and the x-ray report.  She was placed in gutter splint on the right hand.  She is out of pain medicine.    She smokes and is willing to quit.  She has cut back some in the last few months.  Review of Systems  HENT: Negative for congestion.   Respiratory: Negative for cough and shortness of breath.   Cardiovascular: Negative for chest pain and leg swelling.  Endocrine: Positive for cold intolerance.  Musculoskeletal: Positive for arthralgias, back pain, joint swelling and myalgias.  Allergic/Immunologic: Positive for environmental allergies.  Psychiatric/Behavioral: The patient is nervous/anxious.    Past Medical History:  Diagnosis Date  . Anxiety   . Chronic back pain   . Depression   . History of panic attacks 09/11/2014  . Medical history non-contributory   . Ruptured disc, thoracic   . Suicide attempt 2002    Past Surgical History:  Procedure Laterality Date  . CESAREAN SECTION    . CLAVICLE SURGERY    . TUBAL LIGATION      Current Outpatient Prescriptions on File Prior to Visit  Medication Sig Dispense Refill  . calcium-vitamin D (OSCAL WITH D) 500-200 MG-UNIT tablet Take 1 tablet by mouth daily with breakfast. For bone health    . ibuprofen (ADVIL,MOTRIN) 200 MG tablet Take 2 tablets (400 mg total) by mouth every 6 (six) hours as needed for headache. 30 tablet 0  . omega-3 acid ethyl esters (LOVAZA) 1 g capsule Take 1 capsule (1 g total) by mouth daily before breakfast. For high cholesterol 30 capsule 0  . chlordiazePOXIDE (LIBRIUM) 25 MG capsule Take 1 tablet (25 mg) three  times daily (3 capsules) on: 01-10-16: For Benzodiazepine withdrawal symptoms. Take 1 tablet (25 mg) two times daily (2 capsules) on: 01-11-16: For Benzodiazepine withdrawal symptoms. Take 1 tablet (25 mg) one time daily(1 capsules)  on: 01-12-16: For Benzodiazepine withdrawal symptoms. (Patient not taking: Reported on 10/06/2016) 6 capsule 0  . dexamethasone (DECADRON) 4 MG tablet Take 1 tablet (4 mg total) by mouth 2 (two) times daily with a meal. (Patient not taking: Reported on 10/06/2016) 12 tablet 0  . diclofenac (VOLTAREN) 75 MG EC tablet Take 1 tablet (75 mg total) by mouth 2 (two) times daily. (Patient not taking: Reported on 10/06/2016) 14 tablet 0  . hydrOXYzine (ATARAX/VISTARIL) 25 MG tablet Take 1 tablet (25 mg) three times a day as needed: For anxiety (Patient not taking: Reported on 10/06/2016) 60 tablet 0  . nitrofurantoin, macrocrystal-monohydrate, (MACROBID) 100 MG capsule Take 1 capsule (100 mg total) by mouth every 12 (twelve) hours. (For 6 more days): For urinary tract infection. (Patient not taking: Reported on 10/06/2016) 12 capsule 0  . traZODone (DESYREL) 100 MG tablet Take 1 tablet (100 mg total) by mouth at bedtime as needed for sleep. (Patient not taking: Reported on 10/06/2016) 30 tablet 0   No current facility-administered medications on file prior to visit.     Social History   Social History  . Marital status:  Single    Spouse name: N/A  . Number of children: N/A  . Years of education: N/A   Occupational History  . Not on file.   Social History Main Topics  . Smoking status: Current Every Day Smoker    Packs/day: 0.50    Types: Cigarettes  . Smokeless tobacco: Never Used  . Alcohol use Yes     Comment: occassional  . Drug use: No  . Sexual activity: Not Currently    Birth control/ protection: Post-menopausal   Other Topics Concern  . Not on file   Social History Narrative  . No narrative on file    Family History  Problem Relation Age of Onset   . Diabetes Mother   . Pulmonary fibrosis Mother   . Heart attack Father   . Parkinson's disease Sister   . Bipolar disorder Sister   . Thyroid disease Sister   . Heart attack Brother   . Other Son     vascular neucrosis  . Pulmonary fibrosis Maternal Grandmother   . Diabetes Maternal Grandfather   . Stroke Maternal Grandfather   . Pneumonia Paternal Grandfather   . Cancer Sister     melonoma  . Other Brother     back problems, nerve problems, soft bones    BP 124/77   Pulse 75   Temp 98.1 F (36.7 C)   Ht 5\' 6"  (1.676 m)   Wt 146 lb (66.2 kg)   LMP  (LMP Unknown)   BMI 23.57 kg/m      Objective:   Physical Exam  Constitutional: She is oriented to person, place, and time. She appears well-developed and well-nourished.  HENT:  Head: Normocephalic and atraumatic.  Eyes: Conjunctivae and EOM are normal. Pupils are equal, round, and reactive to light.  Neck: Normal range of motion. Neck supple.  Cardiovascular: Normal rate, regular rhythm and intact distal pulses.   Pulmonary/Chest: Effort normal.  Abdominal: Soft.  Musculoskeletal: She exhibits tenderness (Pain of the right hand more on ulnar side at base of fifth metacarpal.  Slight ecchymosis present.  NV intact. ROM fingers decreased secondary to pain.  Left hand negative,.  Neck negative.).  Neurological: She is alert and oriented to person, place, and time. She displays normal reflexes. No cranial nerve deficit. She exhibits normal muscle tone. Coordination normal.  Skin: Skin is warm and dry.  Psychiatric: She has a normal mood and affect. Her behavior is normal. Judgment and thought content normal.    She is placed in an ulnar gutter splint.      Assessment & Plan:   Encounter Diagnoses  Name Primary?  . Closed nondisplaced fracture of base of fifth metacarpal bone of right hand, initial encounter Yes  . Cigarette nicotine dependence without complication   . Tobacco use disorder, continuous   . Tobacco  abuse counseling   . Encounter for smoking cessation counseling    Return in one week.  X-rays in the cast.  Call if any problem.  Precautions discussed.  Electronically Signed Darreld McleanWayne , MD 11/28/20179:15 AM

## 2016-10-06 NOTE — Patient Instructions (Addendum)
Out of work.   Steps to Quit Smoking Smoking tobacco can be bad for your health. It can also affect almost every organ in your body. Smoking puts you and people around you at risk for many serious long-lasting (chronic) diseases. Quitting smoking is hard, but it is one of the best things that you can do for your health. It is never too late to quit. What are the benefits of quitting smoking? When you quit smoking, you lower your risk for getting serious diseases and conditions. They can include:  Lung cancer or lung disease.  Heart disease.  Stroke.  Heart attack.  Not being able to have children (infertility).  Weak bones (osteoporosis) and broken bones (fractures). If you have coughing, wheezing, and shortness of breath, those symptoms may get better when you quit. You may also get sick less often. If you are pregnant, quitting smoking can help to lower your chances of having a baby of low birth weight. What can I do to help me quit smoking? Talk with your doctor about what can help you quit smoking. Some things you can do (strategies) include:  Quitting smoking totally, instead of slowly cutting back how much you smoke over a period of time.  Going to in-person counseling. You are more likely to quit if you go to many counseling sessions.  Using resources and support systems, such as:  Online chats with a Veterinary surgeoncounselor.  Phone quitlines.  Printed Materials engineerself-help materials.  Support groups or group counseling.  Text messaging programs.  Mobile phone apps or applications.  Taking medicines. Some of these medicines may have nicotine in them. If you are pregnant or breastfeeding, do not take any medicines to quit smoking unless your doctor says it is okay. Talk with your doctor about counseling or other things that can help you. Talk with your doctor about using more than one strategy at the same time, such as taking medicines while you are also going to in-person counseling. This can  help make quitting easier. What things can I do to make it easier to quit? Quitting smoking might feel very hard at first, but there is a lot that you can do to make it easier. Take these steps:  Talk to your family and friends. Ask them to support and encourage you.  Call phone quitlines, reach out to support groups, or work with a Veterinary surgeoncounselor.  Ask people who smoke to not smoke around you.  Avoid places that make you want (trigger) to smoke, such as:  Bars.  Parties.  Smoke-break areas at work.  Spend time with people who do not smoke.  Lower the stress in your life. Stress can make you want to smoke. Try these things to help your stress:  Getting regular exercise.  Deep-breathing exercises.  Yoga.  Meditating.  Doing a body scan. To do this, close your eyes, focus on one area of your body at a time from head to toe, and notice which parts of your body are tense. Try to relax the muscles in those areas.  Download or buy apps on your mobile phone or tablet that can help you stick to your quit plan. There are many free apps, such as QuitGuide from the [employer] Systems developer(Centers for Disease Control and Prevention). You can find more support from smokefree.gov and other websites. This information is not intended to replace advice given to you by your health care provider. Make sure you discuss any questions you have with your health care provider. Document Released:  08/22/2009 Document Revised: 06/23/2016 Document Reviewed: 03/12/2015 Elsevier Interactive Patient Education  2017 [employer].

## 2016-10-13 ENCOUNTER — Ambulatory Visit (INDEPENDENT_AMBULATORY_CARE_PROVIDER_SITE_OTHER): Payer: BLUE CROSS/BLUE SHIELD

## 2016-10-13 ENCOUNTER — Ambulatory Visit (INDEPENDENT_AMBULATORY_CARE_PROVIDER_SITE_OTHER): Payer: BLUE CROSS/BLUE SHIELD | Admitting: Orthopaedic Surgery

## 2016-10-13 VITALS — BP 141/83 | HR 70 | Temp 97.3°F | Ht 66.0 in | Wt 146.0 lb

## 2016-10-13 DIAGNOSIS — S62346D Nondisplaced fracture of base of fifth metacarpal bone, right hand, subsequent encounter for fracture with routine healing: Secondary | ICD-10-CM | POA: Diagnosis not present

## 2016-10-13 DIAGNOSIS — F17210 Nicotine dependence, cigarettes, uncomplicated: Secondary | ICD-10-CM

## 2016-10-13 NOTE — Progress Notes (Signed)
CC:  My hand is not hurting  She has been wearing the splint on the right hand with no problem.  Pain is controlled.  NV intact.  Splint intact.  Encounter Diagnoses  Name Primary?  . Closed nondisplaced fracture of base of fifth metacarpal bone of right hand with routine healing, subsequent encounter Yes  . Cigarette nicotine dependence without complication    X-rays were done, reported separately.  Return in two weeks.  X-rays out of the splint of right hand.  Call if any problem.  Electronically Signed Darreld McleanWayne , MD 12/5/20174:15 PM

## 2016-10-20 ENCOUNTER — Telehealth: Payer: Self-pay | Admitting: Orthopaedic Surgery

## 2016-10-20 ENCOUNTER — Telehealth: Payer: Self-pay | Admitting: Orthopedic Surgery

## 2016-10-20 MED ORDER — HYDROCODONE-ACETAMINOPHEN 5-325 MG PO TABS: 1.0000 | ORAL_TABLET | Freq: Four times a day (QID) | ORAL | 0 refills | Status: DC | PRN

## 2016-10-20 NOTE — Telephone Encounter (Signed)
Patient requests refill on Hydrocodone/Acetaminophen 5-325  Mgs.   Qty  56   Sig: Take 1 tablet by mouth every 4 (four) hours as needed for moderate pain (Must last 14 days.Do not take and drive a car or use machinery.).

## 2016-10-27 ENCOUNTER — Ambulatory Visit (INDEPENDENT_AMBULATORY_CARE_PROVIDER_SITE_OTHER): Payer: BLUE CROSS/BLUE SHIELD

## 2016-10-27 ENCOUNTER — Encounter: Payer: Self-pay | Admitting: Orthopaedic Surgery

## 2016-10-27 ENCOUNTER — Ambulatory Visit (INDEPENDENT_AMBULATORY_CARE_PROVIDER_SITE_OTHER): Payer: BLUE CROSS/BLUE SHIELD | Admitting: Orthopaedic Surgery

## 2016-10-27 ENCOUNTER — Telehealth: Payer: Self-pay | Admitting: Orthopaedic Surgery

## 2016-10-27 DIAGNOSIS — S62346D Nondisplaced fracture of base of fifth metacarpal bone, right hand, subsequent encounter for fracture with routine healing: Secondary | ICD-10-CM

## 2016-10-27 NOTE — Telephone Encounter (Signed)
Hydrocodone-Acetaminophen  5/325mg  Qty 45 Tablets °

## 2016-10-27 NOTE — Patient Instructions (Signed)
Stay out of work. 

## 2016-10-27 NOTE — Progress Notes (Signed)
CC:  My hand still hurts  She still has some pain of the right little finger.  She has no new trauma.  It has been several weeks.  I will get OT for her hand.  Encounter Diagnosis  Name Primary?  . Closed nondisplaced fracture of base of fifth metacarpal bone of right hand with routine healing, subsequent encounter Yes   NV is intact.  ROM of the little finger is almost none.  She will not flex the finger.    Return in two weeks.  Begin OT.  Stay out of work.  X-rays on return.  Call if any problem.  Electronically Signed Darreld McleanWayne , MD 12/19/20173:20 PM

## 2016-10-28 MED ORDER — HYDROCODONE-ACETAMINOPHEN 5-325 MG PO TABS: 1.0000 | ORAL_TABLET | Freq: Four times a day (QID) | ORAL | 0 refills | Status: DC | PRN

## 2016-11-05 ENCOUNTER — Encounter (HOSPITAL_COMMUNITY): Payer: Self-pay | Admitting: Occupational Therapy

## 2016-11-05 ENCOUNTER — Ambulatory Visit (HOSPITAL_COMMUNITY): Payer: BLUE CROSS/BLUE SHIELD | Attending: Orthopaedic Surgery | Admitting: Occupational Therapy

## 2016-11-05 DIAGNOSIS — R29898 Other symptoms and signs involving the musculoskeletal system: Secondary | ICD-10-CM | POA: Diagnosis present

## 2016-11-05 DIAGNOSIS — M79644 Pain in right finger(s): Secondary | ICD-10-CM | POA: Diagnosis not present

## 2016-11-05 NOTE — Patient Instructions (Signed)
Finger Exercises: complete 10x each, 1-2x per day   AROM: Finger Flexion / Extension   Actively bend fingers of right hand. Start with knuckles furthest from palm, and slowly make a fist.  Then straighten fingers as far as possible. Repeat __10__ times per set. Do ___1_ sets per session. Do __1-2__ sessions per day.  Copyright  VHI. All rights reserved.   Paper Crumpling Exercise   Begin with right palm down on piece of paper. Maintaining contact between surface and heel of hand, crumple paper into a ball. Repeat __10__ times per set. Do __1__ sets per session. Do __1-2__ sessions per day.  Copyright  VHI. All rights reserved.     Towel Roll Squeeze   With right forearm resting on surface, gently squeeze towel. Repeat __10__ times per set. Do _1___ sets per session. Do _1-2___ sessions per day.  Copyright  VHI. All rights reserved.   Abduction / Adduction (Active)    With hand flat on table, spread all fingers apart, then bring them together as close as possible. Repeat __10__ times. Do __1-2__ sessions per day.  .Marland Kitchen
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
## 2016-11-10 ENCOUNTER — Encounter: Payer: Self-pay | Admitting: Orthopaedic Surgery

## 2016-11-10 ENCOUNTER — Ambulatory Visit (INDEPENDENT_AMBULATORY_CARE_PROVIDER_SITE_OTHER): Payer: BLUE CROSS/BLUE SHIELD

## 2016-11-10 ENCOUNTER — Ambulatory Visit (INDEPENDENT_AMBULATORY_CARE_PROVIDER_SITE_OTHER): Payer: BLUE CROSS/BLUE SHIELD | Admitting: Orthopaedic Surgery

## 2016-11-10 DIAGNOSIS — F17210 Nicotine dependence, cigarettes, uncomplicated: Secondary | ICD-10-CM

## 2016-11-10 DIAGNOSIS — S62346D Nondisplaced fracture of base of fifth metacarpal bone, right hand, subsequent encounter for fracture with routine healing: Secondary | ICD-10-CM

## 2016-11-10 MED ORDER — HYDROCODONE-ACETAMINOPHEN 5-325 MG PO TABS: 1.0000 | ORAL_TABLET | Freq: Four times a day (QID) | ORAL | 0 refills | Status: DC | PRN

## 2016-11-10 NOTE — Progress Notes (Signed)
CC:  My hand is sore  She has been to OT and is improved.  She has less pain but her grip is weak.  I will hold off her returning to work until next Monday.  NV is intact. ROM is full.  X-rays were done today, reported separately.  She is still smoking.  Encounter Diagnoses  Name Primary?  . Closed nondisplaced fracture of base of fifth metacarpal bone of right hand with routine healing, subsequent encounter Yes  . Cigarette nicotine dependence without complication    Return in two weeks.  X-rays on return of right hand.  Call if any problem.  Precautions discussed.  Electronically Signed Darreld McleanWayne , MD 1/2/20181:54 PM

## 2016-11-10 NOTE — Patient Instructions (Addendum)
Steps to Quit Smoking Smoking tobacco can be bad for your health. It can also affect almost every organ in your body. Smoking puts you and people around you at risk for many serious -lasting (chronic) diseases. Quitting smoking is hard, but it is one of the best things that you can do for your health. It is never too late to quit. What are the benefits of quitting smoking? When you quit smoking, you lower your risk for getting serious diseases and conditions. They can include:  Lung cancer or lung disease.  Heart disease.  Stroke.  Heart attack.  Not being able to have children (infertility).  Weak bones (osteoporosis) and broken bones (fractures). If you have coughing, wheezing, and shortness of breath, those symptoms may get better when you quit. You may also get sick less often. If you are pregnant, quitting smoking can help to lower your chances of having a baby of low birth weight. What can I do to help me quit smoking? Talk with your doctor about what can help you quit smoking. Some things you can do (strategies) include:  Quitting smoking totally, instead of slowly cutting back how much you smoke over a period of time.  Going to in-person counseling. You are more likely to quit if you go to many counseling sessions.  Using resources and support systems, such as:  Online chats with a counselor.  Phone quitlines.  Printed self-help materials.  Support groups or group counseling.  Text messaging programs.  Mobile phone apps or applications.  Taking medicines. Some of these medicines may have nicotine in them. If you are pregnant or breastfeeding, do not take any medicines to quit smoking unless your doctor says it is okay. Talk with your doctor about counseling or other things that can help you. Talk with your doctor about using more than one strategy at the same time, such as taking medicines while you are also going to in-person counseling. This can help make quitting  easier. What things can I do to make it easier to quit? Quitting smoking might feel very hard at first, but there is a lot that you can do to make it easier. Take these steps:  Talk to your family and friends. Ask them to support and encourage you.  Call phone quitlines, reach out to support groups, or work with a counselor.  Ask people who smoke to not smoke around you.  Avoid places that make you want (trigger) to smoke, such as:  Bars.  Parties.  Smoke-break areas at work.  Spend time with people who do not smoke.  Lower the stress in your life. Stress can make you want to smoke. Try these things to help your stress:  Getting regular exercise.  Deep-breathing exercises.  Yoga.  Meditating.  Doing a body scan. To do this, close your eyes, focus on one area of your body at a time from head to toe, and notice which parts of your body are tense. Try to relax the muscles in those areas.  Download or buy apps on your mobile phone or tablet that can help you stick to your quit plan. There are many free apps, such as QuitGuide from the CDC (Centers for Disease Control and Prevention). You can find more support from smokefree.gov and other websites. This information is not intended to replace advice given to you by your health care provider. Make sure you discuss any questions you have with your health care provider. Document Released: 08/22/2009 Document Revised: 06/23/2016 Document   Reviewed: 03/12/2015 Elsevier Interactive Patient Education  2017 Elsevier Inc.  

## 2016-11-24 ENCOUNTER — Ambulatory Visit (INDEPENDENT_AMBULATORY_CARE_PROVIDER_SITE_OTHER): Payer: Self-pay | Admitting: Orthopaedic Surgery

## 2016-11-24 ENCOUNTER — Encounter: Payer: Self-pay | Admitting: Orthopaedic Surgery

## 2016-11-24 ENCOUNTER — Ambulatory Visit (INDEPENDENT_AMBULATORY_CARE_PROVIDER_SITE_OTHER): Payer: BLUE CROSS/BLUE SHIELD

## 2016-11-24 DIAGNOSIS — S62346D Nondisplaced fracture of base of fifth metacarpal bone, right hand, subsequent encounter for fracture with routine healing: Secondary | ICD-10-CM

## 2016-11-24 NOTE — Patient Instructions (Signed)
Return to work next Monday, full duty.

## 2016-11-24 NOTE — Progress Notes (Signed)
CC:  They would not let me go back to work  Her right hand is doing well.  There was some confusion as to the date to return to work.  She has no pain, no problem.  ROM of right hand and fingers normal.  NV intact.  X-rays were done, reported separately.  Encounter Diagnosis  Name Primary?  . Closed nondisplaced fracture of base of fifth metacarpal bone of right hand with routine healing, subsequent encounter Yes   Discharge.  Return to work full duty, Monday, January 22.  Electronically Signed Darreld McleanWayne , MD 1/16/201811:52 AM

## 2016-12-07 ENCOUNTER — Telehealth: Payer: Self-pay | Admitting: Orthopaedic Surgery

## 2016-12-07 NOTE — Telephone Encounter (Signed)
Patient left voice message requesting itemized bill for all recent services.  Called back and reached voice mail - relayed our Physician Billing contact [phone].

## 2016-12-14 ENCOUNTER — Ambulatory Visit: Payer: Self-pay | Admitting: Family Medicine

## 2016-12-15 ENCOUNTER — Encounter: Payer: Self-pay | Admitting: Family Medicine

## 2017-02-16 DIAGNOSIS — Z0289 Encounter for other administrative examinations: Secondary | ICD-10-CM | POA: Diagnosis not present

## 2017-02-16 DIAGNOSIS — F331 Major depressive disorder, recurrent, moderate: Secondary | ICD-10-CM | POA: Diagnosis not present

## 2017-02-16 DIAGNOSIS — Z79899 Other long term (current) drug therapy: Secondary | ICD-10-CM | POA: Diagnosis not present

## 2017-04-16 DIAGNOSIS — M79642 Pain in left hand: Secondary | ICD-10-CM | POA: Diagnosis not present

## 2017-04-16 DIAGNOSIS — S60032A Contusion of left middle finger without damage to nail, initial encounter: Secondary | ICD-10-CM | POA: Diagnosis not present

## 2017-04-16 DIAGNOSIS — F17200 Nicotine dependence, unspecified, uncomplicated: Secondary | ICD-10-CM | POA: Diagnosis not present

## 2017-04-16 DIAGNOSIS — Z79899 Other long term (current) drug therapy: Secondary | ICD-10-CM | POA: Diagnosis not present

## 2017-04-17 DIAGNOSIS — S60032A Contusion of left middle finger without damage to nail, initial encounter: Secondary | ICD-10-CM | POA: Diagnosis not present

## 2017-06-13 ENCOUNTER — Emergency Department (HOSPITAL_COMMUNITY)
Admission: EM | Admit: 2017-06-13 | Discharge: 2017-06-13 | Disposition: A | Discharge status: Home / Self Care | Payer: BLUE CROSS/BLUE SHIELD | Attending: Emergency Medicine | Admitting: Emergency Medicine

## 2017-06-13 ENCOUNTER — Emergency Department (HOSPITAL_COMMUNITY): Payer: BLUE CROSS/BLUE SHIELD

## 2017-06-13 ENCOUNTER — Encounter (HOSPITAL_COMMUNITY): Payer: Self-pay

## 2017-06-13 DIAGNOSIS — Y939 Activity, unspecified: Secondary | ICD-10-CM | POA: Insufficient documentation

## 2017-06-13 DIAGNOSIS — Y92000 Kitchen of unspecified non-institutional (private) residence as  the place of occurrence of the external cause: Secondary | ICD-10-CM | POA: Insufficient documentation

## 2017-06-13 DIAGNOSIS — T7411XA Adult physical abuse, confirmed, initial encounter: Secondary | ICD-10-CM | POA: Diagnosis not present

## 2017-06-13 DIAGNOSIS — F17210 Nicotine dependence, cigarettes, uncomplicated: Secondary | ICD-10-CM | POA: Diagnosis not present

## 2017-06-13 DIAGNOSIS — S3992XA Unspecified injury of lower back, initial encounter: Secondary | ICD-10-CM | POA: Diagnosis present

## 2017-06-13 DIAGNOSIS — M546 Pain in thoracic spine: Secondary | ICD-10-CM | POA: Diagnosis not present

## 2017-06-13 DIAGNOSIS — Y999 Unspecified external cause status: Secondary | ICD-10-CM | POA: Insufficient documentation

## 2017-06-13 DIAGNOSIS — S300XXA Contusion of lower back and pelvis, initial encounter: Secondary | ICD-10-CM

## 2017-06-13 DIAGNOSIS — M545 Low back pain: Secondary | ICD-10-CM | POA: Diagnosis not present

## 2017-06-13 MED ORDER — IBUPROFEN 600 MG PO TABS: 600.0000 mg | ORAL_TABLET | Freq: Four times a day (QID) | ORAL | 0 refills | Status: DC | PRN

## 2017-06-13 MED ORDER — IBUPROFEN 800 MG PO TABS
800.0000 mg | ORAL_TABLET | Freq: Once | ORAL | Status: AC
  Administered 2017-06-13: 800 mg via ORAL
  Filled 2017-06-13: qty 1
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
## 2017-06-13 NOTE — ED Notes (Signed)
Pt upset at discharge that she wasn't given a narcotic for pain. Explained xray negative and only contusion noted. Explained that tx was appropriate. Pt frequently uses cocaine and had been drinking prior to arrival as well

## 2017-06-13 NOTE — ED Notes (Signed)
Pt sleeping at this time.
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
## 2017-07-20 DIAGNOSIS — J40 Bronchitis, not specified as acute or chronic: Secondary | ICD-10-CM | POA: Diagnosis not present

## 2017-09-12 ENCOUNTER — Emergency Department (HOSPITAL_COMMUNITY)
Admission: EM | Admit: 2017-09-12 | Discharge: 2017-09-12 | Disposition: A | Discharge status: Home / Self Care | Payer: BLUE CROSS/BLUE SHIELD | Attending: Emergency Medicine | Admitting: Emergency Medicine

## 2017-09-12 ENCOUNTER — Encounter (HOSPITAL_COMMUNITY): Payer: Self-pay | Admitting: *Deleted

## 2017-09-12 DIAGNOSIS — M5136 Other intervertebral disc degeneration, lumbar region: Secondary | ICD-10-CM | POA: Insufficient documentation

## 2017-09-12 DIAGNOSIS — Y939 Activity, unspecified: Secondary | ICD-10-CM | POA: Diagnosis not present

## 2017-09-12 DIAGNOSIS — F17210 Nicotine dependence, cigarettes, uncomplicated: Secondary | ICD-10-CM | POA: Diagnosis not present

## 2017-09-12 DIAGNOSIS — Y929 Unspecified place or not applicable: Secondary | ICD-10-CM | POA: Diagnosis not present

## 2017-09-12 DIAGNOSIS — S3992XA Unspecified injury of lower back, initial encounter: Secondary | ICD-10-CM | POA: Diagnosis present

## 2017-09-12 DIAGNOSIS — Z79899 Other long term (current) drug therapy: Secondary | ICD-10-CM | POA: Diagnosis not present

## 2017-09-12 DIAGNOSIS — Y999 Unspecified external cause status: Secondary | ICD-10-CM | POA: Insufficient documentation

## 2017-09-12 DIAGNOSIS — X503XXA Overexertion from repetitive movements, initial encounter: Secondary | ICD-10-CM | POA: Insufficient documentation

## 2017-09-12 DIAGNOSIS — S39012A Strain of muscle, fascia and tendon of lower back, initial encounter: Secondary | ICD-10-CM | POA: Diagnosis not present

## 2017-09-12 MED ORDER — DEXAMETHASONE SODIUM PHOSPHATE 10 MG/ML IJ SOLN
10.0000 mg | Freq: Once | INTRAMUSCULAR | Status: AC
  Administered 2017-09-12: 10 mg via INTRAMUSCULAR
  Filled 2017-09-12: qty 1

## 2017-09-12 MED ORDER — DIAZEPAM 5 MG PO TABS
10.0000 mg | ORAL_TABLET | Freq: Once | ORAL | Status: AC
  Administered 2017-09-12: 10 mg via ORAL
  Filled 2017-09-12: qty 2

## 2017-09-12 MED ORDER — ONDANSETRON HCL 4 MG PO TABS
4.0000 mg | ORAL_TABLET | Freq: Once | ORAL | Status: AC
  Administered 2017-09-12: 4 mg via ORAL
  Filled 2017-09-12: qty 1

## 2017-09-12 MED ORDER — DEXAMETHASONE 4 MG PO TABS: 4.0000 mg | ORAL_TABLET | Freq: Two times a day (BID) | ORAL | 0 refills | Status: DC

## 2017-09-12 MED ORDER — DICLOFENAC SODIUM 75 MG PO TBEC: 75.0000 mg | DELAYED_RELEASE_TABLET | Freq: Two times a day (BID) | ORAL | 0 refills | Status: DC

## 2017-09-12 MED ORDER — KETOROLAC TROMETHAMINE 60 MG/2ML IM SOLN
60.0000 mg | Freq: Once | INTRAMUSCULAR | Status: AC
  Administered 2017-09-12: 60 mg via INTRAMUSCULAR
  Filled 2017-09-12: qty 2

## 2017-09-12 MED ORDER — CYCLOBENZAPRINE HCL 10 MG PO TABS: 10.0000 mg | ORAL_TABLET | Freq: Three times a day (TID) | ORAL | 0 refills | Status: DC

## 2017-09-12 NOTE — Discharge Instructions (Signed)
Please apply heating pad to your lower back.  Please rest her back as much as possible.  Use diclofenac and Decadron 2 times daily with food.  Use Flexeril 3 times daily for spasm type pain.  Flexeril may cause drowsiness, please do not drive, drink alcohol, operate machinery, or participate in activities requiring concentration.  Please see Dr. Linna CapriceSwinteck for orthopedic evaluation and management if not improving.

## 2017-09-12 NOTE — ED Notes (Signed)
Pt updated on plan of care,  

## 2017-09-12 NOTE — ED Provider Notes (Signed)
ET [facility] EMERGENCY DEPARTMENT Provider Note   CSN: [id number] Arrival date & time: 09/12/17  2032     History   Chief Complaint Chief Complaint  Patient presents with  . Back Pain    HPI Casey [name] is a 56 y.o. female.  Patient is a 56 year old female who presents to the emergency department with a complaint of lower back pain.  The patient states that she has a history of chronic back pain.  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  The patient states that she is now doing a job where she has to do a lot of bending, as well as a ladder reaching over her head and other movements that are repetitive involving her back.  The patient states that she has been having some mild pain off and on, but but 3-4 days ago the pain became so bad that she had to leave work.  At times the pain radiates from her lower back into her right lower extremity.  She has not lost control of bowel or bladder.  She is not falling.  She has not had any changes in her balance, but the pain is worse when she sits, and better when she stands.  He has not had any operations involving her back.  She has tried ibuprofen and Aleve, she states these are not helpful.  She presents now for assistance with this issue.      Past Medical History:  Diagnosis Date  . Anxiety   . Chronic back pain   . Depression   . History of panic attacks 09/11/2014  . Medical history non-contributory   . Ruptured disc, thoracic   . Suicide attempt [facility]) 2002    Patient Active Problem List   Diagnosis Date Noted  . Fracture of base of fifth metacarpal bone of right hand 10/06/2016  . Benzodiazepine dependence, continuous (HCC) 01/09/2016  . History of panic attacks 09/11/2014  . Malaise 09/03/2014  . Fatigue 09/03/2014  . Body mass index (BMI) of 20.0-20.9 in adult 09/03/2014  . Right clavicle fracture 12/12/2013  . Clavicle fracture 11/07/2013    Past  Surgical History:  Procedure Laterality Date  . CESAREAN SECTION    . CLAVICLE SURGERY    . TUBAL LIGATION      OB History    Gravida Para Term Preterm AB Living   2 1     1 1    SAB TAB Ectopic Multiple Live Births   1               Home Medications    Prior to Admission medications   Medication Sig Start Date End Date Taking? Authorizing Provider  calcium-vitamin D (OSCAL WITH D) 500-200 MG-UNIT tablet Take 1 tablet by mouth daily with breakfast. For bone health 01/10/16   Armandina Stammer I, NP  cyclobenzaprine (FLEXERIL) 10 MG tablet Take 1 tablet (10 mg total) 3 (three) times daily by mouth. 09/12/17   Ivery Quale, PA-C  dexamethasone (DECADRON) 4 MG tablet Take 1 tablet (4 mg total) 2 (two) times daily with a meal by mouth. 09/12/17   Ivery Quale, PA-C  diclofenac (VOLTAREN) 75 MG EC tablet Take 1 tablet (75 mg total) 2 (two) times daily by mouth. 09/12/17   Ivery Quale, PA-C  HYDROcodone-acetaminophen (NORCO/VICODIN) 5-325 MG tablet Take 1 tablet by mouth every 6 (six) hours as needed for moderate pain (Must last 14 days.Do not take  and drive a car or use machinery.). 11/10/16   Darreld Mclean, MD  ibuprofen (ADVIL,MOTRIN) 600 MG tablet Take 1 tablet (600 mg total) by mouth every 6 (six) hours as needed. 06/13/17   Burgess Amor, PA-C  omega-3 acid ethyl esters (LOVAZA) 1 g capsule Take 1 capsule (1 g total) by mouth daily before breakfast. For high cholesterol 01/10/16   Sanjuana Kava, NP    Family History Family History  Problem Relation Age of Onset  . Diabetes Mother   . Pulmonary fibrosis Mother   . Heart attack Father   . Parkinson's disease Sister   . Bipolar disorder Sister   . Thyroid disease Sister   . Heart attack Brother   . Other Son        vascular neucrosis  . Pulmonary fibrosis Maternal Grandmother   . Diabetes Maternal Grandfather   . Stroke Maternal Grandfather   . Pneumonia Paternal Grandfather   . Cancer Sister        melonoma  . Other Brother         back problems, nerve problems, soft bones    Social History Social History   Tobacco Use  . Smoking status: Current Every Day Smoker    Packs/day: 0.50    Types: Cigarettes  . Smokeless tobacco: Never Used  Substance Use Topics  . Alcohol use: Yes    Alcohol/week: 0.6 oz    Types: 1 Cans of beer per week    Comment: daily  . Drug use: Yes    Types: "Crack" cocaine, Benzodiazepines, Cocaine    Comment: uses frequently     Allergies   Ampicillin   Review of Systems Review of Systems  Constitutional: Negative for activity change.       All ROS Neg except as noted in HPI  HENT: Negative for nosebleeds.   Eyes: Negative for photophobia and discharge.  Respiratory: Negative for cough, shortness of breath and wheezing.   Cardiovascular: Negative for chest pain and palpitations.  Gastrointestinal: Negative for abdominal pain and blood in stool.  Genitourinary: Negative for dysuria, frequency and hematuria.  Musculoskeletal: Positive for arthralgias and back pain. Negative for neck pain.  Skin: Negative.   Neurological: Negative for dizziness, seizures and speech difficulty.  Psychiatric/Behavioral: Negative for confusion and hallucinations.     Physical Exam Updated Vital Signs BP 125/78 (BP Location: Right Arm)   Pulse 91   Temp 98.1 F (36.7 C) (Oral)   Resp 17   Ht 5' 5.5" (1.664 m)   Wt 72.6 kg (160 lb)   LMP  (LMP Unknown)   SpO2 98%   BMI 26.22 kg/m   Physical Exam  Constitutional: She is oriented to person, place, and time. She appears well-developed and well-nourished.  Non-toxic appearance. No distress.  HENT:  Head: Normocephalic and atraumatic.  Right Ear: Tympanic membrane and external ear normal.  Left Ear: Tympanic membrane and external ear normal.  Eyes: Conjunctivae, EOM and lids are normal. Pupils are equal, round, and reactive to light. Right eye exhibits no discharge. Left eye exhibits no discharge. No scleral icterus.  Neck: Normal range  of motion. Neck supple. Carotid bruit is not present. No tracheal deviation present.  Cardiovascular: Normal rate, regular rhythm, normal heart sounds, intact distal pulses and normal pulses.  Pulmonary/Chest: Effort normal and breath sounds normal. No stridor. No respiratory distress. She has no wheezes. She has no rales.  Abdominal: Soft. Bowel sounds are normal. She exhibits no distension. There is no tenderness.  There is no rebound and no guarding.  Musculoskeletal: She exhibits no edema.       Lumbar back: She exhibits decreased range of motion, tenderness, pain and spasm.  Lymphadenopathy:       Head (right side): No submandibular adenopathy present.       Head (left side): No submandibular adenopathy present.    She has no cervical adenopathy.  Neurological: She is alert and oriented to person, place, and time. She has normal strength. No cranial nerve deficit or sensory deficit. She exhibits normal muscle tone. She displays no seizure activity. Coordination normal.  Skin: Skin is warm and dry. No rash noted.  Psychiatric: She has a normal mood and affect. Her speech is normal.  Nursing note and vitals reviewed.    ED Treatments / Results  Labs (all labs ordered are listed, but only abnormal results are displayed) Labs Reviewed - No data to display  EKG  EKG Interpretation None       Radiology No results found.  Procedures Procedures (including critical care time)  Medications Ordered in ED Medications  dexamethasone (DECADRON) injection 10 mg (10 mg Intramuscular Given 09/12/17 2111)  diazepam (VALIUM) tablet 10 mg (10 mg Oral Given 09/12/17 2110)  ketorolac (TORADOL) injection 60 mg (60 mg Intramuscular Given 09/12/17 2111)  ondansetron (ZOFRAN) tablet 4 mg (4 mg Oral Given 09/12/17 2110)     Initial Impression / Assessment and Plan / ED Course  I have reviewed the triage vital signs and the nursing notes.  Pertinent labs & imaging results that were available  during my care of the patient were reviewed by me and considered in my medical decision making (see chart for details).       Final Clinical Impressions(s) / ED Diagnoses MDM Vital signs within normal limits.  No gross neurologic deficits appreciated on examination at this time.  No evidence for cauda equina or other emergent changes.  The examination suggest probable aggravation of muscle strain with repetitive activity at work, also aggravation of back pain related to the degenerative disc disease that patient has.  I have asked the patient to use a heating pad.  To rest her back as much as possible.  She will be treated with Decadron, Flexeril, and diclofenac.  The patient has been asked to see the orthopedic specialist for additional evaluation and management if not improving.   Final diagnoses:  Strain of lumbar region, initial encounter  DDD (degenerative disc disease), lumbar    ED Discharge Orders        Ordered    cyclobenzaprine (FLEXERIL) 10 MG tablet  3 times daily     09/12/17 2104    dexamethasone (DECADRON) 4 MG tablet  2 times daily with meals     09/12/17 2104    diclofenac (VOLTAREN) 75 MG EC tablet  2 times daily     09/12/17 2104       Ivery QualeBryant, , PA-C 09/12/17 2118    Margarita Grizzleay, Danielle, MD 09/14/17 1038

## 2017-09-12 NOTE — ED Triage Notes (Signed)
Pt with lower back pain that radiates down right leg, denies bladder or bowel incont.. Pt believes it's from new job, a lot of bending.

## 2017-09-12 NOTE — ED Notes (Signed)
Pt states that her pain is better,

## 2018-02-10 DIAGNOSIS — M5441 Lumbago with sciatica, right side: Secondary | ICD-10-CM | POA: Diagnosis not present

## 2018-02-10 DIAGNOSIS — F331 Major depressive disorder, recurrent, moderate: Secondary | ICD-10-CM | POA: Diagnosis not present

## 2018-02-10 DIAGNOSIS — G8929 Other chronic pain: Secondary | ICD-10-CM | POA: Diagnosis not present

## 2018-02-14 ENCOUNTER — Ambulatory Visit (HOSPITAL_COMMUNITY)
Admission: RE | Admit: 2018-02-14 | Discharge: 2018-02-14 | Disposition: A | Discharge status: Home / Self Care | Payer: BLUE CROSS/BLUE SHIELD | Source: Ambulatory Visit | Attending: Physician Assistant | Admitting: Physician Assistant

## 2018-02-14 ENCOUNTER — Other Ambulatory Visit (HOSPITAL_COMMUNITY): Payer: Self-pay | Admitting: Physician Assistant

## 2018-02-14 DIAGNOSIS — M4807 Spinal stenosis, lumbosacral region: Secondary | ICD-10-CM | POA: Insufficient documentation

## 2018-02-14 DIAGNOSIS — G8929 Other chronic pain: Secondary | ICD-10-CM | POA: Insufficient documentation

## 2018-02-14 DIAGNOSIS — M545 Low back pain: Secondary | ICD-10-CM | POA: Diagnosis not present

## 2018-02-14 DIAGNOSIS — M5146 Schmorl's nodes, lumbar region: Secondary | ICD-10-CM | POA: Insufficient documentation

## 2018-02-14 DIAGNOSIS — M5441 Lumbago with sciatica, right side: Secondary | ICD-10-CM | POA: Diagnosis not present

## 2018-03-30 DIAGNOSIS — H5202 Hypermetropia, left eye: Secondary | ICD-10-CM | POA: Diagnosis not present

## 2018-03-30 DIAGNOSIS — H524 Presbyopia: Secondary | ICD-10-CM | POA: Diagnosis not present

## 2018-03-31 DIAGNOSIS — B351 Tinea unguium: Secondary | ICD-10-CM | POA: Diagnosis not present

## 2018-03-31 DIAGNOSIS — R0602 Shortness of breath: Secondary | ICD-10-CM | POA: Diagnosis not present

## 2018-03-31 DIAGNOSIS — M79604 Pain in right leg: Secondary | ICD-10-CM | POA: Diagnosis not present

## 2018-03-31 DIAGNOSIS — G479 Sleep disorder, unspecified: Secondary | ICD-10-CM | POA: Diagnosis not present

## 2018-05-10 DIAGNOSIS — R0602 Shortness of breath: Secondary | ICD-10-CM | POA: Diagnosis not present

## 2018-05-10 DIAGNOSIS — F17200 Nicotine dependence, unspecified, uncomplicated: Secondary | ICD-10-CM | POA: Diagnosis not present

## 2018-05-10 DIAGNOSIS — F419 Anxiety disorder, unspecified: Secondary | ICD-10-CM | POA: Diagnosis not present

## 2018-05-10 DIAGNOSIS — F321 Major depressive disorder, single episode, moderate: Secondary | ICD-10-CM | POA: Diagnosis not present

## 2018-05-13 ENCOUNTER — Emergency Department (HOSPITAL_COMMUNITY): Payer: BLUE CROSS/BLUE SHIELD

## 2018-05-13 ENCOUNTER — Encounter (HOSPITAL_COMMUNITY): Payer: Self-pay

## 2018-05-13 ENCOUNTER — Other Ambulatory Visit: Payer: Self-pay

## 2018-05-13 ENCOUNTER — Emergency Department (HOSPITAL_COMMUNITY)
Admission: EM | Admit: 2018-05-13 | Discharge: 2018-05-13 | Disposition: A | Discharge status: Home / Self Care | Payer: BLUE CROSS/BLUE SHIELD | Attending: Emergency Medicine | Admitting: Emergency Medicine

## 2018-05-13 DIAGNOSIS — Y999 Unspecified external cause status: Secondary | ICD-10-CM | POA: Diagnosis not present

## 2018-05-13 DIAGNOSIS — Y9389 Activity, other specified: Secondary | ICD-10-CM | POA: Insufficient documentation

## 2018-05-13 DIAGNOSIS — Y92013 Bedroom of single-family (private) house as the place of occurrence of the external cause: Secondary | ICD-10-CM | POA: Diagnosis not present

## 2018-05-13 DIAGNOSIS — S52502A Unspecified fracture of the lower end of left radius, initial encounter for closed fracture: Secondary | ICD-10-CM

## 2018-05-13 DIAGNOSIS — S52592A Other fractures of lower end of left radius, initial encounter for closed fracture: Secondary | ICD-10-CM | POA: Diagnosis not present

## 2018-05-13 DIAGNOSIS — S6992XA Unspecified injury of left wrist, hand and finger(s), initial encounter: Secondary | ICD-10-CM | POA: Diagnosis present

## 2018-05-13 DIAGNOSIS — W06XXXA Fall from bed, initial encounter: Secondary | ICD-10-CM | POA: Diagnosis not present

## 2018-05-13 DIAGNOSIS — Z79899 Other long term (current) drug therapy: Secondary | ICD-10-CM | POA: Diagnosis not present

## 2018-05-13 DIAGNOSIS — F17210 Nicotine dependence, cigarettes, uncomplicated: Secondary | ICD-10-CM | POA: Insufficient documentation

## 2018-05-13 DIAGNOSIS — S52532A Colles' fracture of left radius, initial encounter for closed fracture: Secondary | ICD-10-CM | POA: Diagnosis not present

## 2018-05-13 DIAGNOSIS — S5290XA Unspecified fracture of unspecified forearm, initial encounter for closed fracture: Secondary | ICD-10-CM

## 2018-05-13 DIAGNOSIS — S52612A Displaced fracture of left ulna styloid process, initial encounter for closed fracture: Secondary | ICD-10-CM | POA: Diagnosis not present

## 2018-05-13 MED ORDER — FENTANYL CITRATE (PF) 100 MCG/2ML IJ SOLN
100.0000 ug | Freq: Once | INTRAMUSCULAR | Status: AC
  Administered 2018-05-13: 100 ug via INTRAVENOUS
  Filled 2018-05-13: qty 2

## 2018-05-13 MED ORDER — SODIUM CHLORIDE 0.9 % IV SOLN
INTRAVENOUS | Status: DC
  Administered 2018-05-13: 08:00:00 via INTRAVENOUS

## 2018-05-13 MED ORDER — ONDANSETRON 8 MG PO TBDP
8.0000 mg | ORAL_TABLET | Freq: Once | ORAL | Status: AC
  Administered 2018-05-13: 8 mg via ORAL
  Filled 2018-05-13: qty 1

## 2018-05-13 MED ORDER — LIDOCAINE HCL (PF) 2 % IJ SOLN
INTRAMUSCULAR | Status: AC
  Filled 2018-05-13: qty 20

## 2018-05-13 MED ORDER — LIDOCAINE HCL (PF) 2 % IJ SOLN
10.0000 mL | Freq: Once | INTRAMUSCULAR | Status: AC
  Administered 2018-05-13: 10 mL

## 2018-05-13 MED ORDER — HYDROCODONE-ACETAMINOPHEN 5-325 MG PO TABS: 1.0000 | ORAL_TABLET | ORAL | 0 refills | Status: DC | PRN

## 2018-05-13 MED ORDER — FENTANYL CITRATE (PF) 100 MCG/2ML IJ SOLN
50.0000 ug | Freq: Once | INTRAMUSCULAR | Status: AC
  Administered 2018-05-13: 50 ug via INTRAVENOUS
  Filled 2018-05-13: qty 2

## 2018-05-13 MED ORDER — ETOMIDATE 2 MG/ML IV SOLN
0.1500 mg/kg | Freq: Once | INTRAVENOUS | Status: AC
  Administered 2018-05-13: 10 mg via INTRAVENOUS
  Filled 2018-05-13: qty 10

## 2018-05-13 MED ORDER — OXYCODONE-ACETAMINOPHEN 5-325 MG PO TABS
1.0000 | ORAL_TABLET | Freq: Once | ORAL | Status: AC
  Administered 2018-05-13: 1 via ORAL
  Filled 2018-05-13: qty 1

## 2018-05-13 NOTE — Discharge Instructions (Addendum)
Elevate the left wrist above your heart, for at least an hour 3 or 4 times a day.  Wear the left arm sling as needed for comfort.  Call the orthopedic hand specialist for a follow-up appointment in 4 or 5 days for reevaluation of the fracture and consideration of additional treatment to stabilize the fracture, as needed.

## 2018-05-13 NOTE — ED Provider Notes (Signed)
[facility] EMERGENCY DEPARTMENT Provider Note   CSN: [id number] Arrival date & time: 05/13/18  8657     History   Chief Complaint Chief Complaint  Patient presents with  . Wrist Injury    left     HPI Casey [name] is a 57 y.o. female.  HPI   Patient presents for evaluation of left wrist injury, which occurred at 4 AM this morning when she got out of bed, was tangled in sheets, and fell catching herself with the left hand on the floor.  No other injuries.  She denies headache, neck pain, back pain, weakness or dizziness.  There are no other known modifying factors.  Past Medical History:  Diagnosis Date  . Anxiety   . Chronic back pain   . Depression   . History of panic attacks 09/11/2014  . Medical history non-contributory   . Ruptured disc, thoracic   . Suicide attempt [facility]) 2002    Patient Active Problem List   Diagnosis Date Noted  . Fracture of base of fifth metacarpal bone of right hand 10/06/2016  . Benzodiazepine dependence, continuous (HCC) 01/09/2016  . History of panic attacks 09/11/2014  . Malaise 09/03/2014  . Fatigue 09/03/2014  . Body mass index (BMI) of 20.0-20.9 in adult 09/03/2014  . Right clavicle fracture 12/12/2013  . Clavicle fracture 11/07/2013    Past Surgical History:  Procedure Laterality Date  . CESAREAN SECTION    . CLAVICLE SURGERY    . TUBAL LIGATION       OB History    Gravida  2   Para  1   Term      Preterm      AB  1   Living  1     SAB  1   TAB      Ectopic      Multiple      Live Births               Home Medications    Prior to Admission medications   Medication Sig Start Date End Date Taking? Authorizing Provider  ALPRAZolam Prudy Feeler) 0.25 MG tablet Take 1 tablet by mouth 2 (two) times daily. 05/10/18  Yes [provider]  calcium-vitamin D (OSCAL WITH D) 500-200 MG-UNIT tablet Take 1 tablet by mouth daily with breakfast. For bone health 01/10/16  Yes Armandina Stammer I, NP  ibuprofen  (ADVIL,MOTRIN) 600 MG tablet Take 1 tablet (600 mg total) by mouth every 6 (six) hours as needed. 06/13/17  Yes Idol, Raynelle Fanning, PA-C  cyclobenzaprine (FLEXERIL) 10 MG tablet Take 1 tablet (10 mg total) 3 (three) times daily by mouth. Patient not taking: Reported on 05/13/2018 09/12/17   Ivery Quale, PA-C  dexamethasone (DECADRON) 4 MG tablet Take 1 tablet (4 mg total) 2 (two) times daily with a meal by mouth. Patient not taking: Reported on 05/13/2018 09/12/17   Ivery Quale, PA-C  diclofenac (VOLTAREN) 75 MG EC tablet Take 1 tablet (75 mg total) 2 (two) times daily by mouth. Patient not taking: Reported on 05/13/2018 09/12/17   Ivery Quale, PA-C  escitalopram (LEXAPRO) 10 MG tablet Take 1 tablet by mouth daily. 05/10/18   [provider]  HYDROcodone-acetaminophen (NORCO) 5-325 MG tablet Take 1 tablet by mouth every 4 (four) hours as needed for moderate pain. 05/13/18   Mancel Bale, MD  omega-3 acid ethyl esters (LOVAZA) 1 g capsule Take 1 capsule (1 g total) by mouth daily before breakfast. For high cholesterol Patient not taking:  Reported on 05/13/2018 01/10/16   Armandina Stammer I, NP  terbinafine (LAMISIL) 250 MG tablet Take 1 tablet by mouth daily. 03/31/18   [provider]    Family History Family History  Problem Relation Age of Onset  . Diabetes Mother   . Pulmonary fibrosis Mother   . Heart attack Father   . Parkinson's disease Sister   . Bipolar disorder Sister   . Thyroid disease Sister   . Heart attack Brother   . Other Son        vascular neucrosis  . Pulmonary fibrosis Maternal Grandmother   . Diabetes Maternal Grandfather   . Stroke Maternal Grandfather   . Pneumonia Paternal Grandfather   . Cancer Sister        melonoma  . Other Brother        back problems, nerve problems, soft bones    Social History Social History   Tobacco Use  . Smoking status: Current Every Day Smoker    Packs/day: 0.00    Types: Cigarettes  . Smokeless tobacco: Never Used  .  Tobacco comment: 2-3 cigarettes per day  Substance Use Topics  . Alcohol use: Yes    Alcohol/week: 0.6 oz    Types: 1 Cans of beer per week    Comment: occ   . Drug use: Never    Types: "Crack" cocaine, Benzodiazepines     Allergies   Ampicillin   Review of Systems Review of Systems  All other systems reviewed and are negative.    Physical Exam Updated Vital Signs BP 121/77   Pulse 60   Temp 97.6 F (36.4 C) (Oral)   Resp 20   Ht 5\' 7"  (1.702 m)   Wt 66.7 kg (147 lb)   LMP  (LMP Unknown)   SpO2 99%   BMI 23.02 kg/m   Physical Exam  Constitutional: She is oriented to person, place, and time. She appears well-developed and well-nourished. She appears distressed (Uncomfortable).  HENT:  Head: Normocephalic and atraumatic.  Eyes: Pupils are equal, round, and reactive to light. Conjunctivae and EOM are normal.  Neck: Normal range of motion and phonation normal. Neck supple.  Cardiovascular: Normal rate and regular rhythm.  Pulmonary/Chest: Effort normal and breath sounds normal. She exhibits no tenderness.  Musculoskeletal:  Decreased movement left radius with mild deformity.  Neurovascular intact distally in the fingers of the left hand.  Good range of motion fingers left hand.  Neurological: She is alert and oriented to person, place, and time. She exhibits normal muscle tone.  Skin: Skin is warm and dry.  Psychiatric: She has a normal mood and affect. Her behavior is normal. Judgment and thought content normal.  Nursing note and vitals reviewed.    ED Treatments / Results  Labs (all labs ordered are listed, but only abnormal results are displayed) Labs Reviewed - No data to display  EKG None  Radiology Dg Wrist 2 Views Left  Result Date: 05/13/2018 CLINICAL DATA:  Distal left radial fracture. EXAM: LEFT WRIST - 2 VIEW COMPARISON:  Radiographs of May 13, 2018. FINDINGS: The left wrist has been splinted and immobilized. Stable appearance of mildly displaced  distal left radial fracture is noted. Stable mildly displaced left ulnar styloid fracture is noted as well. IMPRESSION: Status post splinting and immobilization of left wrist. Stable appearance of mildly displaced left radial and ulnar styloid fractures. Electronically Signed   By: Lupita Raider, M.D.   On: 05/13/2018 09:16   Dg Wrist Complete  Left  Result Date: 05/13/2018 CLINICAL DATA:  Fall with pain and deformity. EXAM: LEFT WRIST - COMPLETE 3+ VIEW COMPARISON:  04/16/2017. FINDINGS: Colles type fractures of the ulnar styloid and distal radius. Dorsal tilt of the distal radial articular surface. Fracture lines do not visibly extend to the radial articular surface. No carpal fracture. IMPRESSION: Colles fractures of the distal radius and ulnar styloid. Electronically Signed   By: Paulina Fusi M.D.   On: 05/13/2018 06:55    Procedures .Sedation Date/Time: 05/13/2018 8:59 AM Performed by: Mancel Bale, MD Authorized by: Mancel Bale, MD   Consent:    Consent obtained:  Written   Consent given by:  Patient   Risks discussed:  Prolonged hypoxia resulting in organ damage, respiratory compromise necessitating ventilatory assistance and intubation, nausea and vomiting   Alternatives discussed:  Analgesia without sedation Universal protocol:    Procedure explained and questions answered to patient or proxy's satisfaction: yes     Immediately prior to procedure a time out was called: yes   Indications:    Procedure performed:  Fracture reduction   Intended level of sedation:  Deep Pre-sedation assessment:    Time since last food or drink:  12   ASA classification: class 1 - normal, healthy patient     Neck mobility: normal     Mouth opening:  3 or more finger widths   Mallampati score:  II - soft palate, uvula, fauces visible   Pre-sedation assessments completed and reviewed: airway patency, cardiovascular function, hydration status, mental status, pain level and respiratory function      Pre-sedation assessments completed and reviewed: nausea/vomiting not reviewed     Pre-sedation assessment completed:  05/13/2018 8:30 AM Immediate pre-procedure details:    Reassessment: Patient reassessed immediately prior to procedure     Reviewed: vital signs     Verified: bag valve mask available, emergency equipment available, intubation equipment available, IV patency confirmed, oxygen available and suction available   Procedure details (see MAR for exact dosages):    Preoxygenation:  Nasal cannula   Sedation:  Etomidate   Analgesia:  Fentanyl   Intra-procedure monitoring:  Blood pressure monitoring, continuous capnometry, continuous pulse oximetry, cardiac monitor, frequent LOC assessments and frequent vital sign checks   Intra-procedure events: none     Total Provider sedation time (minutes):  15 Post-procedure details:    Post-sedation assessment completed:  05/13/2018 9:01 AM   Attendance: Constant attendance by certified staff until patient recovered     Recovery: Patient returned to pre-procedure baseline     Post-sedation assessments completed and reviewed: airway patency, cardiovascular function, hydration status, mental status and respiratory function     Post-sedation assessments completed and reviewed: nausea/vomiting not reviewed and pain level not reviewed     Patient is stable for discharge or admission: no     Patient tolerance:  Tolerated well, no immediate complications Reduction of fracture Date/Time: 05/13/2018 9:03 AM Performed by: Mancel Bale, MD Authorized by: Mancel Bale, MD  Consent: Verbal consent obtained. Written consent obtained. Risks and benefits: risks, benefits and alternatives were discussed Consent given by: patient Patient understanding: patient states understanding of the procedure being performed Patient consent: the patient's understanding of the procedure matches consent given Procedure consent: procedure consent matches procedure  scheduled Relevant documents: relevant documents present and verified Test results: test results available and properly labeled Site marked: the operative site was not marked Imaging studies: imaging studies available Required items: required blood products, implants, devices, and special equipment available  Patient identity confirmed: verbally with patient Time out: Immediately prior to procedure a "time out" was called to verify the correct patient, procedure, equipment, support staff and site/side marked as required. Preparation: Patient was prepped and draped in the usual sterile fashion. Local anesthesia used: yes Anesthesia: hematoma block  Anesthesia: Local anesthesia used: yes Local Anesthetic: lidocaine 2% without epinephrine Anesthetic total: 7 mL  Sedation: Patient sedated: yes Sedatives: etomidate Analgesia: fentanyl Sedation start date/time: 05/13/2018 8:45 AM Sedation end date/time: 05/13/2018 9:04 AM Vitals: Vital signs were monitored during sedation.  Patient tolerance: Patient tolerated the procedure well with no immediate complications Comments: Patient placed in finger traps for relaxation, manual reduction of fracture, by me, with improvement in alignment and less deformity.  Imaging ordered to confirm.  Continued normal perfusion to fingers of the left hand.  Splint applied by me with assistance of nurse.  Marland Kitchen.Splint Application Date/Time: 05/13/2018 10:00 AM Performed by: Mancel BaleWentz, , MD Authorized by: Mancel BaleWentz, , MD   Consent:    Consent obtained:  Verbal   Consent given by:  Patient   Risks discussed:  Discoloration, numbness and pain   Alternatives discussed:  Alternative treatment Pre-procedure details:    Sensation:  Normal   Skin color:  Pink Procedure details:    Laterality:  Left   Location:  Wrist   Wrist:  L wrist   Strapping: no     Splint type:  Sugar tong   Supplies:  Cotton padding and Ortho-Glass Post-procedure details:    Pain:   Improved   Sensation:  Normal   Patient tolerance of procedure:  Tolerated well, no immediate complications Comments:     More comfortable with second splint application   (including critical care time)  Medications Ordered in ED Medications  0.9 %  sodium chloride infusion ( Intravenous New Bag/Given 05/13/18 0752)  lidocaine (XYLOCAINE) 2 % injection (has no administration in time range)  lidocaine (XYLOCAINE) 2 % injection (has no administration in time range)  oxyCODONE-acetaminophen (PERCOCET/ROXICET) 5-325 MG per tablet 1 tablet (has no administration in time range)  ondansetron (ZOFRAN-ODT) disintegrating tablet 8 mg (has no administration in time range)  fentaNYL (SUBLIMAZE) injection 50 mcg (50 mcg Intravenous Given 05/13/18 0702)  lidocaine (XYLOCAINE) 2 % injection 10 mL (10 mLs Other Given by Other 05/13/18 0817)  etomidate (AMIDATE) injection 10 mg (10 mg Intravenous Given 05/13/18 0844)  fentaNYL (SUBLIMAZE) injection 100 mcg (100 mcg Intravenous Given 05/13/18 0752)     Initial Impression / Assessment and Plan / ED Course  I have reviewed the triage vital signs and the nursing notes.  Pertinent labs & imaging results that were available during my care of the patient were reviewed by me and considered in my medical decision making (see chart for details).  Clinical Course as of May 13 1002  Fri May 13, 2018  [id number] Case discussed with orthopedics, Dr. Aundria Rudogers who advises that I reduce the fracture, splinted and have her see the hand surgeon Dr. Orlan Leavensrtman next week.   [EW]  [id number] Colles' fracture impacted and angulated, images reviewed by me.  DG Wrist Complete Left [EW]  0906 DG Wrist 2 Views Left [EW]  0923 Modest improvement of alignment, images reviewed by me  DG Wrist 2 Views Left [EW]    Clinical Course User Index [EW] Mancel BaleWentz, , MD     Patient Vitals for the past 24 hrs:  BP Temp Temp src Pulse Resp SpO2 Height Weight  05/13/18 0915 121/77 - - 60 20  99 % - -   05/13/18 0901 133/84 - - 60 14 100 % - -  05/13/18 0846 118/76 - - 87 (!) 22 100 % - -  05/13/18 0845 118/76 - - 72 (!) 26 100 % - -  05/13/18 0843 127/66 - - 64 19 99 % - -  05/13/18 0830 127/90 - - 65 17 99 % - -  05/13/18 0815 134/86 - - 60 16 99 % - -  05/13/18 0730 120/82 - - 66 (!) 21 97 % - -  05/13/18 0629 128/82 97.6 F (36.4 C) Oral 76 19 98 % - -  05/13/18 4098 - - - - - - 5\' 7"  (1.702 m) 66.7 kg (147 lb)    9:28 AM Reevaluation with update and discussion. After initial assessment and treatment, an updated evaluation reveals she is comfortable, alert and cooperative.  She feels like the left forearm is aching.  Splint will be replaced by me, with a sugar tong appliance.  Findings discussed with the patient and all questions were answered. Mancel Bale   Medical Decision Making: Angulated Colles' fracture, manipulated in ED, to improve alignment.  Patient tolerated procedure well.  Extremity splinted.  Pain controlled in ED.  CRITICAL CARE-no Performed by: Mancel Bale   Nursing Notes Reviewed/ Care Coordinated Applicable Imaging Reviewed Interpretation of Laboratory Data incorporated into ED treatment  The patient appears reasonably screened and/or stabilized for discharge and I doubt any other medical condition or other [facility] requiring further screening, evaluation, or treatment in the ED at this time prior to discharge.  Plan: Home Medications-continue usual medications; Home Treatments-elevation, sling; return here if the recommended treatment, does not improve the symptoms; Recommended follow up-hand surgery follow-up for 5 days     Final Clinical Impressions(s) / ED Diagnoses   Final diagnoses:  Fracture, radius  Closed Colles' fracture of left radius, initial encounter    ED Discharge Orders        Ordered    HYDROcodone-acetaminophen (NORCO) 5-325 MG tablet  Every 4 hours PRN     05/13/18 0926       Mancel Bale, MD 05/13/18 1004

## 2018-05-13 NOTE — ED Notes (Signed)
Etomidate given by Jerald KiefEboni Wiley RN

## 2018-05-13 NOTE — ED Notes (Signed)
Pt is now to baseline orientation. Alert. Responsive to speech.

## 2018-05-13 NOTE — ED Triage Notes (Signed)
Pt reports falling and catching self with left wrist (hyper-extending). Obvious deformity noted with swelling. Radial and ulnar pulses present. Cap refill < 3.

## 2018-05-17 DIAGNOSIS — S52532A Colles' fracture of left radius, initial encounter for closed fracture: Secondary | ICD-10-CM | POA: Diagnosis not present

## 2018-05-25 DIAGNOSIS — S52522D Torus fracture of lower end of left radius, subsequent encounter for fracture with routine healing: Secondary | ICD-10-CM | POA: Diagnosis not present

## 2018-06-08 DIAGNOSIS — X58XXXA Exposure to other specified factors, initial encounter: Secondary | ICD-10-CM | POA: Diagnosis not present

## 2018-06-08 DIAGNOSIS — S52512A Displaced fracture of left radial styloid process, initial encounter for closed fracture: Secondary | ICD-10-CM | POA: Diagnosis not present

## 2018-06-08 DIAGNOSIS — S52572A Other intraarticular fracture of lower end of left radius, initial encounter for closed fracture: Secondary | ICD-10-CM | POA: Diagnosis not present

## 2018-06-08 DIAGNOSIS — S52612A Displaced fracture of left ulna styloid process, initial encounter for closed fracture: Secondary | ICD-10-CM | POA: Diagnosis not present

## 2018-06-08 DIAGNOSIS — Y999 Unspecified external cause status: Secondary | ICD-10-CM | POA: Diagnosis not present

## 2018-06-23 DIAGNOSIS — S52522D Torus fracture of lower end of left radius, subsequent encounter for fracture with routine healing: Secondary | ICD-10-CM | POA: Diagnosis not present

## 2018-07-08 DIAGNOSIS — S52522D Torus fracture of lower end of left radius, subsequent encounter for fracture with routine healing: Secondary | ICD-10-CM | POA: Diagnosis not present

## 2018-07-13 ENCOUNTER — Ambulatory Visit (HOSPITAL_COMMUNITY): Payer: BLUE CROSS/BLUE SHIELD | Attending: Physician Assistant | Admitting: Occupational Therapy

## 2018-07-13 ENCOUNTER — Other Ambulatory Visit: Payer: Self-pay

## 2018-07-13 ENCOUNTER — Encounter (HOSPITAL_COMMUNITY): Payer: Self-pay | Admitting: Occupational Therapy

## 2018-07-13 DIAGNOSIS — M25532 Pain in left wrist: Secondary | ICD-10-CM

## 2018-07-13 DIAGNOSIS — R29898 Other symptoms and signs involving the musculoskeletal system: Secondary | ICD-10-CM

## 2018-07-13 DIAGNOSIS — M25632 Stiffness of left wrist, not elsewhere classified: Secondary | ICD-10-CM

## 2018-07-13 DIAGNOSIS — M79644 Pain in right finger(s): Secondary | ICD-10-CM | POA: Insufficient documentation

## 2018-07-13 NOTE — Patient Instructions (Signed)
AROM Exercises: Complete exercises __10-15____ times each, ____2___ times per day  1) Wrist Flexion  Start with wrist at edge of table, palm facing up. With wrist hanging slightly off table, curl wrist upward, and back down.      2) Wrist Extension  Start with wrist at edge of table, palm facing down. With wrist slightly off the edge of the table, curl wrist up and back down.      3) Radial Deviations  Start with forearm flat against a table, wrist hanging slightly off the edge, and palm facing the wall. Bending at the wrist only, and keeping palm facing the wall, bend wrist so fist is pointing towards the floor, back up to start position, and up towards the ceiling. Return to start.        4) WRIST PRONATION  Turn your forearm towards palm face down.  Keep your elbow bent and by the side of your  Body.      5) WRIST SUPINATION  Turn your forearm towards palm face up.  Keep your elbow bent and by the side of your  Body.        Home Exercises Program Theraputty Exercises  Do the following exercises 2 times a day using your affected hand.  1. Roll putty into a ball.  2. Make into a pancake.  3. Roll putty into a roll.  4. Pinch along log with first finger and thumb.   5. Make into a ball.  6. Roll it back into a log.   7. Pinch using thumb and side of first finger.  8. Roll into a ball, then flatten into a pancake.  9. Using your fingers, make putty into a mountain.

## 2018-07-13 NOTE — Therapy (Signed)
[facility] [address] [place], [place], [address] Phone: [phone]   Fax:  [phone]  Occupational Therapy Evaluation  Patient Details  Name: Casey [name] MRN: [id number] Date of Birth: 01-23-1961 Referring Provider: Dr. Bradly Bienenstock   Encounter Date: 07/13/2018  OT End of Session - 07/13/18 1352    Visit Number  1    Number of Visits  8    Date for OT Re-Evaluation  08/12/18    Authorization Type  BC[employer] other    Authorization Time Period  30 visit limit, 0 used    Authorization - Visit Number  1    Authorization - Number of Visits  30    OT Start Time  1309    OT Stop Time  1345    OT Time Calculation (min)  36 min    Activity Tolerance  Patient tolerated treatment well    Behavior During Therapy  [facility] for tasks assessed/performed       Past Medical History:  Diagnosis Date  . Anxiety   . Chronic back pain   . Depression   . History of panic attacks 09/11/2014  . Medical history non-contributory   . Ruptured disc, thoracic   . Suicide attempt (HCC) 2002    Past Surgical History:  Procedure Laterality Date  . CESAREAN SECTION    . CLAVICLE SURGERY    . TUBAL LIGATION      There were no vitals filed for this visit.  Subjective Assessment - 07/13/18 1349    Subjective   S: I've only been wearing the brace while I'm driving or out and about.     Pertinent History  Pt is a 57 y/o female s/p left wrist ORIF after sustaining a torus fracture of the left distal radius on 05/13/18 when she became twisted in her sheets and fell out of the bed. Pt underwent ORIF on 06/08/18. Pt was referred to occupational therapy for evaluation and treatment by Dr. Bradly Bienenstock    Patient Stated Goals  To be able to use my left wrist and go back to work.     Currently in Pain?  Yes    Pain Score  5     Pain Location  Wrist    Pain Orientation  Left    Pain Descriptors / Indicators  Throbbing    Pain Type  Acute pain    Pain Radiating  Towards  N/A    Pain Onset  More than a month ago    Pain Frequency  Constant    Aggravating Factors   movement, use    Pain Relieving Factors  pain medication, rest    Effect of Pain on Daily Activities  mod effect on ADL completion    Multiple Pain Sites  No        [facility] OT Assessment - 07/13/18 1308      Assessment   Medical Diagnosis  s/p Left wrist ORIF    Referring Provider  Dr. Bradly Bienenstock    Onset Date/Surgical Date  06/08/18    Hand Dominance  Right    Prior Therapy  None      Precautions   Precautions  None      Balance Screen   Has the patient fallen in the past 6 months  Yes    How many times?  1    Has the patient had a decrease in activity level because of a fear of falling?   No  Is the patient reluctant to leave their home because of a fear of falling?   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     Leisure  watching drag racing, gardening, outdoor activities      ADL   ADL comments  Pt is having pain with all ADL tasks using right hand. Pt is able to pick up light weights, unable to lift heavy objects, pt reports difficulty grasping and maintaining hold on objects      Written Expression   Dominant Hand  Right      Cognition   Overall Cognitive Status  Within Functional Limits for tasks assessed      ROM / Strength   AROM / PROM / Strength  AROM;PROM;Strength      Palpation   Palpation comment  scar tissue and fascial restrictions along volar surface of distal forearm at incision site      AROM   AROM Assessment Site  Wrist;Forearm    Right/Left Forearm  Left    Left Forearm Pronation  90 Degrees    Left Forearm Supination  90 Degrees    Right/Left Wrist  Left                Left Wrist Extension  33 Degrees    Left Wrist Flexion  30 Degrees    Left Wrist Radial  Deviation  8 Degrees    Left Wrist Ulnar Deviation  25 Degrees      PROM   PROM Assessment Site  Forearm;Wrist    Right/Left Forearm  Left    Left Forearm Pronation  90 Degrees    Left Forearm Supination  80 Degrees    Right/Left Wrist  Left                Left Wrist Extension  40 Degrees    Left Wrist Flexion  45 Degrees    Left Wrist Radial Deviation  18 Degrees    Left Wrist Ulnar Deviation  25 Degrees      Strength   Strength Assessment Site  Forearm;Wrist;Hand    Right/Left Forearm  Left    Left Forearm Pronation  4-/5    Left Forearm Supination  4-/5    Right/Left Wrist  Left    Left Wrist Flexion  3+/5    Left Wrist Extension  4/5    Left Wrist Radial Deviation  4-/5    Left Wrist Ulnar Deviation  4/5    Right/Left hand  Right;Left    Right Hand Gross Grasp  Functional    Right Hand Grip (lbs)  50    Right Hand Lateral Pinch  14 lbs    Right Hand 3 Point Pinch  15 lbs    Left Hand Gross Grasp  Functional    Left Hand Grip (lbs)  28    Left Hand Lateral Pinch  9 lbs    Left Hand 3 Point Pinch  9 lbs                      OT Education - 07/13/18 1337    Education Details  wrist A/ROM; red theraputty for grip and pinch strengthening    Person(s) Educated  Patient    Methods  Explanation;Demonstration;Handout    Comprehension  Verbalized  understanding;Returned demonstration       OT Short Term Goals - 07/13/18 1358      OT SHORT TERM GOAL #1   Title  Pt will be educated on and independent  in HEP to improve mobility required in LUE for ADL completion.     Time  4    Period  Weeks    Status  New    Target Date  08/12/18      OT SHORT TERM GOAL #2   Title  Pt will decrease fascial restrictions in left wrist to improve mobility required for driving.     Time  4    Period  Weeks    Status  New      OT SHORT TERM GOAL #3   Title  Pt will decrease pain in left wrist to 3/10 or less to improve ability to use LUE as assist during overhead  lifting tasks.     Time  4    Period  Weeks    Status  New      OT SHORT TERM GOAL #4   Title  Pt will improve left wrist ROM to [facility] to improve ability to complete gardening tasks.     Time  4    Period  Weeks    Status  New      OT SHORT TERM GOAL #5   Title  Pt will improve left wrist strength to 5/5 to improve ability to complete heavy lifting tasks at work.     Time  4    Period  Weeks    Status  New      Additional Short Term Goals   Additional Short Term Goals  Yes      OT SHORT TERM GOAL #6   Title  Pt will increase left grip strength by 15# and pinch strength by 3# to improve ability to hold and utilize tools required for yardwork including pushmower.     Time  4    Period  Weeks    Status  New               Plan - 07/13/18 1353    Clinical Impression Statement  A: Pt is a 57 y/o female s/p left ORIF on 06/08/18 presenting in prefabricated wrist brace. Pt reports she is only wearing brace when driving, otherwise does not use. Pt presenting with pain and functional limitations preventing use of left hand as non-dominant during B/ADLs, I/ADLs, and work tasks. Pt provided with HEP today for wrist ROM and grip strengthening. Pt plans to return to work in early October if she meets the minimum lifting requirements (20#).     Occupational Profile and client history currently impacting functional performance  Pt is independent and is motivated to return to highest level of functioning and return to work.     Occupational performance deficits (Please refer to evaluation for details):  ADL's;IADL's;Rest and Sleep;Work;Leisure    Rehab Potential  Good    OT Frequency  2x / week    OT Duration  8 weeks    OT Treatment/Interventions  Self-care/ADL training;Therapeutic exercise;Ultrasound;Manual Therapy;Therapeutic activities;Cryotherapy;Electrical Stimulation;Moist Heat;Passive range of motion;Patient/family education    Plan  P: Pt will benefit from skilled OT services to  decrease pain and fascial restrictions, increase joint ROM, strength, and functional use of LUE as non-dominant. Treatment plan: myofascial release, manual therapy, P/ROM, A/ROM, left wrist strengthening, grip and pinch strengthening, modalities prn    Clinical Decision Making  Limited treatment options, no task modification necessary    OT Home Exercise Plan  07/13/18: wrist ROM, grip strengthening with putty    Consulted and Agree with Plan of Care  Patient       Patient will benefit from skilled therapeutic intervention in order to improve the following deficits and impairments:  Decreased activity tolerance, Decreased strength, Impaired flexibility, Decreased range of motion, Pain, Increased fascial restrictions, Impaired UE functional use  Visit Diagnosis: Pain in left wrist  Stiffness of left wrist, not elsewhere classified  Other symptoms and signs involving the musculoskeletal system    Problem List Patient Active Problem List   Diagnosis Date Noted  . Fracture of base of fifth metacarpal bone of right hand 10/06/2016  . Benzodiazepine dependence, continuous (HCC) 01/09/2016  . History of panic attacks 09/11/2014  . Malaise 09/03/2014  . Fatigue 09/03/2014  . Body mass index (BMI) of 20.0-20.9 in adult 09/03/2014  . Right clavicle fracture 12/12/2013  . Clavicle fracture 11/07/2013   Ezra Sites, OTR/L  [phone] 07/13/2018, 2:03 PM  [facility] [address] [place], [place], [address] Phone: [phone]   Fax:  [phone]  Name: Casey [name] MRN: [id number] Date of Birth: 08-11-1961

## 2018-07-20 ENCOUNTER — Ambulatory Visit (HOSPITAL_COMMUNITY): Payer: BLUE CROSS/BLUE SHIELD

## 2018-07-20 ENCOUNTER — Encounter (HOSPITAL_COMMUNITY): Payer: Self-pay

## 2018-07-20 DIAGNOSIS — M79644 Pain in right finger(s): Secondary | ICD-10-CM | POA: Diagnosis not present

## 2018-07-20 DIAGNOSIS — R29898 Other symptoms and signs involving the musculoskeletal system: Secondary | ICD-10-CM

## 2018-07-20 DIAGNOSIS — M25532 Pain in left wrist: Secondary | ICD-10-CM

## 2018-07-20 DIAGNOSIS — M25632 Stiffness of left wrist, not elsewhere classified: Secondary | ICD-10-CM

## 2018-07-20 NOTE — Patient Instructions (Signed)
  Use 2# handweight. Complete slow 10 repetitions each exercise.  WRIST EXTENSION CURLS - TABLE  Hold a small free weight, rest your forearm on a table and bend your wrist up and down with your palm face down as shown.        FREE WEIGHT SUPINATION AND PRONATION  Rest your forearm on your knee or a table. Next, while holding the end of a small weight, slowly lower the weight towards the outside and then rotate your forearm towards the inside of your body as shown.       Wrist Flexion  Supporting the forearm on table top and with palm facing upward, raise the weight as far as possible bending at the wrist.  Maintain arm contact on the table.     FREE WEIGHT RADIAL DEVIATION - TABLE  Hold a small free weight, rest your forearm on a table and bend your wrist up and down with your palm facing towards the side as shown.

## 2018-07-20 NOTE — Therapy (Addendum)
[facility] [address] [place], [place], [address] Phone: [phone]   Fax:  [phone]  Occupational Therapy Treatment  Patient Details  Name: Casey [name] MRN: [id number] Date of Birth: 29-Aug-1961 Referring Provider: Dr. Bradly Bienenstock   Encounter Date: 07/20/2018  OT End of Session - 07/20/18 1444    Visit Number  2    Number of Visits  8    Date for OT Re-Evaluation  08/12/18    Authorization Type  BC[employer] other    Authorization Time Period  30 visit limit, 0 used    Authorization - Visit Number  2    Authorization - Number of Visits  30    OT Start Time  1355   pt arrived late   OT Stop Time  1430    OT Time Calculation (min)  35 min    Activity Tolerance  Patient tolerated treatment well    Behavior During Therapy  [facility] for tasks assessed/performed       Past Medical History:  Diagnosis Date  . Anxiety   . Chronic back pain   . Depression   . History of panic attacks 09/11/2014  . Medical history non-contributory   . Ruptured disc, thoracic   . Suicide attempt (HCC) 2002    Past Surgical History:  Procedure Laterality Date  . CESAREAN SECTION    . CLAVICLE SURGERY    . TUBAL LIGATION      There were no vitals filed for this visit.  Subjective Assessment - 07/20/18 1414    Subjective   S: I've been working on my wrist hard and it's definitely better than the other day.     Currently in Pain?  No/denies         [facility] OT Assessment - 07/20/18 1419      Assessment   Medical Diagnosis  s/p Left wrist ORIF      Precautions   Precautions  None      Palpation   Palpation comment  Trace fascial restrictions along scar and forearm region during session.       Strength   Left Hand Grip (lbs)  40   previous: 28   Left Hand Lateral Pinch  9 lbs   previous: same   Left Hand 3 Point Pinch  14 lbs   previous: 9              OT Treatments/Exercises (OP) - 07/20/18 1420      Exercises   Exercises   Wrist;Hand;Elbow      Elbow Exercises   Forearm Supination  Strengthening;10 reps    Bar Weights/Barbell (Forearm Supination)  2 lbs    Forearm Pronation  Strengthening;10 reps    Bar Weights/Barbell (Forearm Pronation)  2 lbs      Additional Elbow Exercises   Hand Gripper with Large Beads  all beads with gripper set at 35#      Wrist Exercises   Wrist Flexion  PROM;Strengthening;10 reps    Bar Weights/Barbell (Wrist Flexion)  2 lbs    Wrist Extension  PROM;Strengthening;10 reps    Bar Weights/Barbell (Wrist Extension)  2 lbs    Wrist Radial Deviation  PROM;Strengthening;10 reps    Bar Weights/Barbell (Radial Deviation)  2 lbs    Wrist Ulnar Deviation  PROM;Strengthening;10 reps    Bar Weights/Barbell (Ulnar Deviation)  2 lbs             OT Education - 07/20/18 1440  Education Details  Wrist strengthening with 2# hand weight. Pt provided with print out of OT evaluation. Reviewed goals and plan of care.     Person(s) Educated  Patient    Methods  Explanation;Demonstration;Handout;Verbal cues    Comprehension  Returned demonstration;Verbalized understanding       OT Short Term Goals - 07/20/18 1445      OT SHORT TERM GOAL #1   Title  Pt will be educated on and independent  in HEP to improve mobility required in LUE for ADL completion.     Time  4    Period  Weeks    Status  On-going      OT SHORT TERM GOAL #2   Title  Pt will decrease fascial restrictions in left wrist to improve mobility required for driving.     Time  4    Period  Weeks    Status  On-going      OT SHORT TERM GOAL #3   Title  Pt will decrease pain in left wrist to 3/10 or less to improve ability to use LUE as assist during overhead lifting tasks.     Time  4    Period  Weeks    Status  On-going      OT SHORT TERM GOAL #4   Title  Pt will improve left wrist ROM to [facility] to improve ability to complete gardening tasks.     Time  4    Period  Weeks    Status  On-going      OT SHORT TERM GOAL  #5   Title  Pt will improve left wrist strength to 5/5 to improve ability to complete heavy lifting tasks at work.     Time  4    Period  Weeks    Status  On-going      OT SHORT TERM GOAL #6   Title  Pt will increase left grip strength by 15# and pinch strength by 3# to improve ability to hold and utilize tools required for yardwork including pushmower.     Time  4    Period  Weeks    Status  On-going             07/25/18 1404  OT Assessment and Plan  Clinical Impression Statement A: Initiated manual techniques although patient presented with minimal restrictions in wrist. Focused on wrist strengthening and grip strength. Patient overall performed well while being provided with VC for form and technique.   Pt will benefit from skilled therapeutic intervention in order to improve on the following deficits  Decreased activity tolerance;Decreased strength;Impaired flexibility;Decreased range of motion;Pain;Increased fascial restrictions;Impaired UE functional use  Plan P: Continue to work on increasing grip and wrist strengthening. Follow up on HEP from last session.   Consulted and Agree with Plan of Care Patient         Patient will benefit from skilled therapeutic intervention in order to improve the following deficits and impairments:     Visit Diagnosis: Pain in left wrist  Other symptoms and signs involving the musculoskeletal system  Pain in right finger(s)  Stiffness of left wrist, not elsewhere classified    Problem List Patient Active Problem List   Diagnosis Date Noted  . Fracture of base of fifth metacarpal bone of right hand 10/06/2016  . Benzodiazepine dependence, continuous (HCC) 01/09/2016  . History of panic attacks 09/11/2014  . Malaise 09/03/2014  . Fatigue 09/03/2014  . Body mass index (BMI) of  20.0-20.9 in adult 09/03/2014  . Right clavicle fracture 12/12/2013  . Clavicle fracture 11/07/2013   Limmie Patricia, OTR/L,CBIS   [phone]  07/20/2018, 2:48 PM  [place] [facility] [address] [place], [place], [address] Phone: [phone]   Fax:  [phone]  Name: Casey [name] MRN: [id number] Date of Birth: [birth year]

## 2018-07-22 ENCOUNTER — Encounter

## 2018-07-25 ENCOUNTER — Encounter (HOSPITAL_COMMUNITY): Payer: Self-pay

## 2018-07-25 ENCOUNTER — Ambulatory Visit (HOSPITAL_COMMUNITY): Payer: BLUE CROSS/BLUE SHIELD

## 2018-07-25 DIAGNOSIS — M25632 Stiffness of left wrist, not elsewhere classified: Secondary | ICD-10-CM | POA: Diagnosis not present

## 2018-07-25 DIAGNOSIS — R29898 Other symptoms and signs involving the musculoskeletal system: Secondary | ICD-10-CM | POA: Diagnosis not present

## 2018-07-25 DIAGNOSIS — M25532 Pain in left wrist: Secondary | ICD-10-CM

## 2018-07-25 DIAGNOSIS — M79644 Pain in right finger(s): Secondary | ICD-10-CM | POA: Diagnosis not present

## 2018-07-25 NOTE — Therapy (Signed)
[place] [facility] [address] [place], [place], [address] Phone: [phone]   Fax:  [phone]  Occupational Therapy Treatment  Patient Details  Name: Casey [name] MRN: [id number] Date of Birth: Mar 29, 1961 Referring Provider: Dr. Bradly BienenstockFred Ortmann   Encounter Date: 07/25/2018  OT End of Session - 07/25/18 1526    Visit Number  3    Number of Visits  8    Date for OT Re-Evaluation  08/12/18    Authorization Type  BCBS other    Authorization Time Period  30 visit limit, 0 used    Authorization - Visit Number  3    Authorization - Number of Visits  30    OT Start Time  1434    OT Stop Time  1515    OT Time Calculation (min)  41 min    Activity Tolerance  Patient tolerated treatment well    Behavior During Therapy  [facility] assessed/per[facility]       Past Medical History:  Diagnosis Date  . Anxiety   . Chronic back pain   . Depression   . History of panic attacks 09/11/2014  . Medical history non-contributory   . Ruptured disc, thoracic   . Suicide attempt (HCC) 2002    Past Surgical History:  Procedure Laterality Date  . CESAREAN SECTION    . CLAVICLE SURGERY    . TUBAL LIGATION      There were no vitals filed for this visit.  Subjective Assessment - 07/25/18 1441    Subjective   S: I have been working on it and it's gotten better.     Currently in Pain?  No/denies         [facility] - 07/25/18 1443      Assessment   Medical Diagnosis  s/p Left wrist ORIF      Precautions   Precautions  None               OT Treatments/Exercises (OP) - 07/25/18 1443      Exercises   Exercises  Wrist;Hand;Elbow;Theraputty      Elbow Exercises   Forearm Supination  Strengthening;PROM;10 reps   12X   Bar Weights/Barbell (Forearm Supination)  2 lbs    Forearm Pronation  Strengthening;PROM;10 reps   12X   Bar Weights/Barbell (Forearm Pronation)  2 lbs      Additional Elbow Exercises   Theraputty - Flatten   red- standing    Theraputty - Roll  red    Theraputty - Grip  red    Hand Gripper with Large Beads  all beads with gripper set at 35#    Hand Gripper with Medium Beads  all beads with gripper set at 5#    Hand Gripper with Small Beads  all beads with gripper set at 29# (vertical)      Wrist Exercises   Wrist Flexion  PROM;5 reps;Strengthening   12X   Bar Weights/Barbell (Wrist Flexion)  2 lbs    Wrist Extension  PROM;5 reps;Strengthening   12X   Bar Weights/Barbell (Wrist Extension)  2 lbs    Wrist Radial Deviation  PROM;5 reps;Strengthening   12X   Bar Weights/Barbell (Radial Deviation)  2 lbs    Wrist Ulnar Deviation  PROM;5 reps;Strengthening   12X   Bar Weights/Barbell (Ulnar Deviation)  2 lbs      Additional Wrist Exercises   Sponges  Using green resistive clothespin, patient picked up 30 sponges using 3 point  pinch.      Hand Exercises   Other Hand Exercises  Sponges: 26, 34    Other Hand Exercises  With red putty flattened, patient used pvc pipe to cut circles focusing on wrist flexion and extension and strength.               OT Short Term Goals - 07/20/18 1445      OT SHORT TERM GOAL #1   Title  Pt will be educated on and independent  in HEP to improve mobility required in LUE for ADL completion.     Time  4    Period  Weeks    Status  On-going      OT SHORT TERM GOAL #2   Title  Pt will decrease fascial restrictions in left wrist to improve mobility required for driving.     Time  4    Period  Weeks    Status  On-going      OT SHORT TERM GOAL #3   Title  Pt will decrease pain in left wrist to 3/10 or less to improve ability to use LUE as assist during overhead lifting tasks.     Time  4    Period  Weeks    Status  On-going      OT SHORT TERM GOAL #4   Title  Pt will improve left wrist ROM to [facility] to improve ability to complete gardening tasks.     Time  4    Period  Weeks    Status  On-going      OT SHORT TERM GOAL #5   Title  Pt will improve  left wrist strength to 5/5 to improve ability to complete heavy lifting tasks at work.     Time  4    Period  Weeks    Status  On-going      OT SHORT TERM GOAL #6   Title  Pt will increase left grip strength by 15# and pinch strength by 3# to improve ability to hold and utilize tools required for yardwork including pushmower.     Time  4    Period  Weeks    Status  On-going               Plan - 07/25/18 1530    Clinical Impression Statement  A: Increased strengthening repetitions to 12 this session with patient reports of mild pain and discomfort. Patient continues to have slightly under full range during passive wrist flexion and passive supination. Required VC during session to complete tasks without compensatory muscle movements.     Plan  P: Continue with wrist strengthening increasing repetitions to 15 if able to tolerate. Continue to focus on decreasing compensatory movement patterns. Complete twizzer task with small colored pegs to focus on pinch strength.     Consulted and Agree with Plan of Care  Patient       Patient will benefit from skilled therapeutic intervention in order to improve the following deficits and impairments:  Decreased activity tolerance, Decreased strength, Impaired flexibility, Decreased range of motion, Pain, Increased fascial restrictions, Impaired UE functional use  Visit Diagnosis: Pain in left wrist  Other symptoms and signs involving the musculoskeletal system  Stiffness of left wrist, not elsewhere classified    Problem List Patient Active Problem List   Diagnosis Date Noted  . Fracture of base of fifth metacarpal bone of right hand 10/06/2016  . Benzodiazepine dependence, continuous (HCC) 01/09/2016  . History  of panic attacks 09/11/2014  . Malaise 09/03/2014  . Fatigue 09/03/2014  . Body mass index (BMI) of 20.0-20.9 in adult 09/03/2014  . Right clavicle fracture 12/12/2013  . Clavicle fracture 11/07/2013   Limmie Patricia,  OTR/L,CBIS  [phone]  07/25/2018, 3:41 PM  [facility] [address] [place], [place], [address] Phone: [phone]   Fax:  [phone]  Name: Casey [name] MRN: [id number] Date of Birth: 1961-08-17

## 2018-07-29 ENCOUNTER — Encounter

## 2018-08-01 ENCOUNTER — Ambulatory Visit (HOSPITAL_COMMUNITY): Payer: BLUE CROSS/BLUE SHIELD | Admitting: Specialist

## 2018-08-01 ENCOUNTER — Telehealth (HOSPITAL_COMMUNITY): Payer: Self-pay | Admitting: Specialist

## 2018-08-01 NOTE — Telephone Encounter (Signed)
Pt got here early and went to [employer] for lunch- while there she got sick and will be here on Wed.

## 2018-08-03 ENCOUNTER — Ambulatory Visit (HOSPITAL_COMMUNITY): Payer: BLUE CROSS/BLUE SHIELD | Admitting: Occupational Therapy

## 2018-08-05 DIAGNOSIS — S52522D Torus fracture of lower end of left radius, subsequent encounter for fracture with routine healing: Secondary | ICD-10-CM | POA: Diagnosis not present

## 2018-08-09 ENCOUNTER — Ambulatory Visit (HOSPITAL_COMMUNITY): Payer: BLUE CROSS/BLUE SHIELD | Attending: Physician Assistant | Admitting: Occupational Therapy

## 2018-08-09 ENCOUNTER — Telehealth (HOSPITAL_COMMUNITY): Payer: Self-pay | Admitting: Occupational Therapy

## 2018-08-09 NOTE — Telephone Encounter (Signed)
No shows #1 & #2: Attempted to call pt regarding no shows on 9/25 and 10/1. Received message stating the number is not a working number and unable to reach pt.    Ezra Sites, OTR/L  [phone] 08/09/2018

## 2018-08-11 ENCOUNTER — Ambulatory Visit (HOSPITAL_COMMUNITY): Payer: BLUE CROSS/BLUE SHIELD | Admitting: Occupational Therapy

## 2018-08-16 DIAGNOSIS — F321 Major depressive disorder, single episode, moderate: Secondary | ICD-10-CM | POA: Diagnosis not present

## 2018-08-16 DIAGNOSIS — F17200 Nicotine dependence, unspecified, uncomplicated: Secondary | ICD-10-CM | POA: Diagnosis not present

## 2018-08-16 DIAGNOSIS — J449 Chronic obstructive pulmonary disease, unspecified: Secondary | ICD-10-CM | POA: Diagnosis not present

## 2018-08-16 DIAGNOSIS — F419 Anxiety disorder, unspecified: Secondary | ICD-10-CM | POA: Diagnosis not present

## 2018-09-01 DIAGNOSIS — S52522D Torus fracture of lower end of left radius, subsequent encounter for fracture with routine healing: Secondary | ICD-10-CM | POA: Diagnosis not present

## 2018-09-22 ENCOUNTER — Encounter (HOSPITAL_COMMUNITY): Payer: Self-pay

## 2018-09-22 NOTE — Therapy (Signed)
[place], [place], [address] Phone: [phone]   Fax:  [phone]  Patient Details  Name: Casey [name] MRN: [id number] Date of Birth: 04-Mar-1961 Referring Provider:  No ref. provider found  Encounter Date: 09/22/2018   OCCUPATIONAL THERAPY DISCHARGE SUMMARY  Visits from Start of Care: 3  Current functional level related to goals / functional outcomes: Patient had 2-3 no shows and clinic was unable to contact patient. As patient has not return since 07/25/18 she will be discharged from OT services.    Remaining deficits: Patient continues to have slightly under full range during passive wrist flexion and passive supination.   Education / Equipment: Wrist HEP Plan:                                                    Patient goals were not met. Patient is being discharged due to not returning since the last visit.  ?????          Ailene Ravel, OTR/L,CBIS  [phone]  09/22/2018, 4:07 PM  [place] [address] [place], [place], [address] Phone: [phone]   Fax:  [phone]

## 2018-11-16 DIAGNOSIS — J449 Chronic obstructive pulmonary disease, unspecified: Secondary | ICD-10-CM | POA: Diagnosis not present

## 2018-11-16 DIAGNOSIS — F321 Major depressive disorder, single episode, moderate: Secondary | ICD-10-CM | POA: Diagnosis not present

## 2018-11-16 DIAGNOSIS — S6292XS Unspecified fracture of left wrist and hand, sequela: Secondary | ICD-10-CM | POA: Diagnosis not present

## 2018-11-16 DIAGNOSIS — F419 Anxiety disorder, unspecified: Secondary | ICD-10-CM | POA: Diagnosis not present

## 2019-02-11 DIAGNOSIS — F419 Anxiety disorder, unspecified: Secondary | ICD-10-CM | POA: Diagnosis not present

## 2019-02-11 DIAGNOSIS — S43492A Other sprain of left shoulder joint, initial encounter: Secondary | ICD-10-CM | POA: Diagnosis not present

## 2019-02-11 DIAGNOSIS — G44209 Tension-type headache, unspecified, not intractable: Secondary | ICD-10-CM | POA: Diagnosis not present

## 2019-02-15 DIAGNOSIS — F321 Major depressive disorder, single episode, moderate: Secondary | ICD-10-CM | POA: Diagnosis not present

## 2019-02-15 DIAGNOSIS — J301 Allergic rhinitis due to pollen: Secondary | ICD-10-CM | POA: Diagnosis not present

## 2019-02-15 DIAGNOSIS — F419 Anxiety disorder, unspecified: Secondary | ICD-10-CM | POA: Diagnosis not present

## 2019-02-15 DIAGNOSIS — J449 Chronic obstructive pulmonary disease, unspecified: Secondary | ICD-10-CM | POA: Diagnosis not present

## 2019-05-10 DIAGNOSIS — F17200 Nicotine dependence, unspecified, uncomplicated: Secondary | ICD-10-CM | POA: Diagnosis not present

## 2019-05-10 DIAGNOSIS — J449 Chronic obstructive pulmonary disease, unspecified: Secondary | ICD-10-CM | POA: Diagnosis not present

## 2019-05-10 DIAGNOSIS — F419 Anxiety disorder, unspecified: Secondary | ICD-10-CM | POA: Diagnosis not present

## 2019-05-10 DIAGNOSIS — F321 Major depressive disorder, single episode, moderate: Secondary | ICD-10-CM | POA: Diagnosis not present

## 2019-06-23 DIAGNOSIS — S81811A Laceration without foreign body, right lower leg, initial encounter: Secondary | ICD-10-CM | POA: Diagnosis not present

## 2019-07-03 DIAGNOSIS — M545 Low back pain: Secondary | ICD-10-CM | POA: Diagnosis not present

## 2019-07-03 DIAGNOSIS — F419 Anxiety disorder, unspecified: Secondary | ICD-10-CM | POA: Diagnosis not present

## 2019-07-03 DIAGNOSIS — J449 Chronic obstructive pulmonary disease, unspecified: Secondary | ICD-10-CM | POA: Diagnosis not present

## 2019-07-03 DIAGNOSIS — F17200 Nicotine dependence, unspecified, uncomplicated: Secondary | ICD-10-CM | POA: Diagnosis not present

## 2019-09-04 ENCOUNTER — Encounter (HOSPITAL_COMMUNITY): Payer: Self-pay

## 2019-09-04 ENCOUNTER — Emergency Department (HOSPITAL_COMMUNITY)
Admission: EM | Admit: 2019-09-04 | Discharge: 2019-09-04 | Disposition: A | Discharge status: Home / Self Care | Payer: BC Managed Care – PPO | Attending: Emergency Medicine | Admitting: Emergency Medicine

## 2019-09-04 ENCOUNTER — Emergency Department (HOSPITAL_COMMUNITY): Payer: BC Managed Care – PPO

## 2019-09-04 ENCOUNTER — Other Ambulatory Visit: Payer: Self-pay

## 2019-09-04 DIAGNOSIS — F17210 Nicotine dependence, cigarettes, uncomplicated: Secondary | ICD-10-CM | POA: Insufficient documentation

## 2019-09-04 DIAGNOSIS — E876 Hypokalemia: Secondary | ICD-10-CM | POA: Diagnosis not present

## 2019-09-04 DIAGNOSIS — F1410 Cocaine abuse, uncomplicated: Secondary | ICD-10-CM | POA: Diagnosis not present

## 2019-09-04 DIAGNOSIS — Z79899 Other long term (current) drug therapy: Secondary | ICD-10-CM | POA: Diagnosis not present

## 2019-09-04 DIAGNOSIS — M545 Low back pain: Secondary | ICD-10-CM | POA: Diagnosis not present

## 2019-09-04 DIAGNOSIS — M5432 Sciatica, left side: Secondary | ICD-10-CM | POA: Insufficient documentation

## 2019-09-04 DIAGNOSIS — K5900 Constipation, unspecified: Secondary | ICD-10-CM | POA: Diagnosis not present

## 2019-09-04 DIAGNOSIS — M549 Dorsalgia, unspecified: Secondary | ICD-10-CM | POA: Diagnosis not present

## 2019-09-04 DIAGNOSIS — M5442 Lumbago with sciatica, left side: Secondary | ICD-10-CM | POA: Diagnosis not present

## 2019-09-04 DIAGNOSIS — R1110 Vomiting, unspecified: Secondary | ICD-10-CM | POA: Diagnosis not present

## 2019-09-04 LAB — CBC WITH DIFFERENTIAL/PLATELET
Abs Immature Granulocytes: 0.01 10*3/uL (ref 0.00–0.07)
Basophils Absolute: 0 10*3/uL (ref 0.0–0.1)
Basophils Relative: 0 %
Eosinophils Absolute: 0.3 10*3/uL (ref 0.0–0.5)
Eosinophils Relative: 4 %
HCT: 33.7 % — ABNORMAL LOW (ref 36.0–46.0)
Hemoglobin: 10.9 g/dL — ABNORMAL LOW (ref 12.0–15.0)
Immature Granulocytes: 0 %
Lymphocytes Relative: 46 %
Lymphs Abs: 3.5 10*3/uL (ref 0.7–4.0)
MCH: 33.1 pg (ref 26.0–34.0)
MCHC: 32.3 g/dL (ref 30.0–36.0)
MCV: 102.4 fL — ABNORMAL HIGH (ref 80.0–100.0)
Monocytes Absolute: 0.6 10*3/uL (ref 0.1–1.0)
Monocytes Relative: 7 %
Neutro Abs: 3.3 10*3/uL (ref 1.7–7.7)
Neutrophils Relative %: 43 %
Platelets: 253 10*3/uL (ref 150–400)
RBC: 3.29 MIL/uL — ABNORMAL LOW (ref 3.87–5.11)
RDW: 15.7 % — ABNORMAL HIGH (ref 11.5–15.5)
WBC: 7.7 10*3/uL (ref 4.0–10.5)
nRBC: 0 % (ref 0.0–0.2)

## 2019-09-04 LAB — COMPREHENSIVE METABOLIC PANEL
ALT: 110 U/L — ABNORMAL HIGH (ref 0–44)
AST: 36 U/L (ref 15–41)
Albumin: 2.9 g/dL — ABNORMAL LOW (ref 3.5–5.0)
Alkaline Phosphatase: 149 U/L — ABNORMAL HIGH (ref 38–126)
Anion gap: 4 — ABNORMAL LOW (ref 5–15)
BUN: 6 mg/dL (ref 6–20)
CO2: 24 mmol/L (ref 22–32)
Calcium: 7.9 mg/dL — ABNORMAL LOW (ref 8.9–10.3)
Chloride: 108 mmol/L (ref 98–111)
Creatinine, Ser: 0.44 mg/dL (ref 0.44–1.00)
GFR calc Af Amer: >60 mL/min (ref >60)
GFR calc non Af Amer: >60 mL/min (ref >60)
Glucose, Bld: 97 mg/dL (ref 70–99)
Potassium: 3.2 mmol/L — ABNORMAL LOW (ref 3.5–5.1)
Sodium: 136 mmol/L (ref 135–145)
Total Bilirubin: 0.9 mg/dL (ref 0.3–1.2)
Total Protein: 6.8 g/dL (ref 6.5–8.1)

## 2019-09-04 LAB — LIPASE, BLOOD: Lipase: 23 U/L (ref 11–51)

## 2019-09-04 LAB — URINALYSIS, ROUTINE W REFLEX MICROSCOPIC
Bacteria, UA: NONE SEEN
Bilirubin Urine: NEGATIVE
Glucose, UA: NEGATIVE mg/dL
Hgb urine dipstick: NEGATIVE
Ketones, ur: NEGATIVE mg/dL
Nitrite: NEGATIVE
Protein, ur: NEGATIVE mg/dL
Specific Gravity, Urine: 1.013 (ref 1.005–1.030)
pH: 6 (ref 5.0–8.0)

## 2019-09-04 MED ORDER — MORPHINE SULFATE (PF) 4 MG/ML IV SOLN
4.0000 mg | Freq: Once | INTRAVENOUS | Status: AC
  Administered 2019-09-04: 4 mg via INTRAVENOUS
  Filled 2019-09-04: qty 1

## 2019-09-04 MED ORDER — POTASSIUM CHLORIDE ER 10 MEQ PO TBCR: 10.0000 meq | EXTENDED_RELEASE_TABLET | Freq: Every day | ORAL | 0 refills | Status: DC

## 2019-09-04 MED ORDER — POLYETHYLENE GLYCOL 3350 17 GM/SCOOP PO POWD: 1.0000 | Freq: Once | ORAL | 0 refills | Status: AC

## 2019-09-04 MED ORDER — ONDANSETRON HCL 4 MG/2ML IJ SOLN
4.0000 mg | Freq: Once | INTRAMUSCULAR | Status: AC
  Administered 2019-09-04: 4 mg via INTRAVENOUS
  Filled 2019-09-04: qty 2

## 2019-09-04 MED ORDER — KETOROLAC TROMETHAMINE 30 MG/ML IJ SOLN
30.0000 mg | Freq: Once | INTRAMUSCULAR | Status: AC
  Administered 2019-09-04: 30 mg via INTRAVENOUS
  Filled 2019-09-04: qty 1

## 2019-09-04 MED ORDER — SODIUM CHLORIDE 0.9 % IV BOLUS
1000.0000 mL | Freq: Once | INTRAVENOUS | Status: AC
  Administered 2019-09-04: 1000 mL via INTRAVENOUS

## 2019-09-04 MED ORDER — DOCUSATE SODIUM 100 MG PO CAPS: 100.0000 mg | ORAL_CAPSULE | Freq: Two times a day (BID) | ORAL | 0 refills | Status: DC

## 2019-09-04 NOTE — Discharge Instructions (Addendum)
Your lab work today shows your liver enzymes are elevated and your potassium is low. Please follow up with primary care doctor to have this rechecked within 1 week. Your xray today shows constipation with a large amount of stool.  To help reduce constipation and promote bowel health, 1. Drink at least 64 ounces of water each day; 2. Eat plenty of fiber (fruits, vegetables, whole grains, legumes) 3. Get plenty of physical activity  If needed, you may also use daily or as needed, MiraLax (Osmotic Laxative) up to  1-2 times a day and Colace (Stool Softener aka Docusate) 100 mg up to twice a day to help with bowel movements. These medications are over the counter.  MiraLax is an Osmotic You may use other over-the-counter medications such as Dulcolax, Fleet enemas, magnesium citrate as needed for constipation. Please note that some of these medications may cause you to have abdominal cramping which is normal. If you develop severe abdominal pain, fever, vomiting, distention of your abdomen, unable to have a bowel movement for 5 days or are not passing gas, please return to the hospital. -I have sent prescriptions to the pharmacy for MiraLAX and Colace to help with your constipation.  If your insurance does not cover them you can buy them over-the-counter and use as directed on the bottle.  -Prescription also sent for potassium replacement as your potassium was low today.  Take as prescribed  Return to the Emergency Department for any fever, worsening pain, blood in stool, severe abdominal pain, or any other worsening or concerning symptoms.

## 2019-09-04 NOTE — ED Triage Notes (Signed)
Pt reports that the left side of her back has been hurting since last Tuesday. Reports vomiting multiple times. Now pain all over her body.  Pt reports it may be her kidneys

## 2019-09-04 NOTE — ED Provider Notes (Signed)
[facility] EMERGENCY DEPARTMENT Provider Note   CSN: [id number] Arrival date & time: 09/04/19  1601     History   Chief Complaint Chief Complaint  Patient presents with  . Back Pain    HPI Casey [name] is a 58 y.o. female past medical history significant for anxiety, chronic back pain, ruptured thoracic disc presents to emergency department today with chief complaint of abdominal pain and back pain.  Patient states pain has been present x12 days.  Pain is located in her left lower back and radiates down her left leg.  She describes the pain as sharp.  Rates the pain 8 out of 10 in severity.  States her pain is worse after standing on her feet for long periods of time.  She has been working more than she usually does, 54 hours weekly for the last several weeks.  States she does a lot of bending and twisting and heavy lifting.  Also reporting generalized abdominal pain.  She states she has not had a bowel movement in over 1 week.  She has history of constipation and this feels similar.  She has tried taking Tylenol and ibuprofen for her symptoms without much relief.  Denies abdominal surgical history.  Denies fevers, weight loss, numbness/weakness of upper and lower extremities, bowel/bladder incontinence, urinary retention, history of cancer, saddle anesthesia, history of back surgery, history of IVDA.     Past Medical History:  Diagnosis Date  . Anxiety   . Chronic back pain   . Depression   . History of panic attacks 09/11/2014  . Medical history non-contributory   . Ruptured disc, thoracic   . Suicide attempt [facility]) 2002    Patient Active Problem List   Diagnosis Date Noted  . Fracture of base of fifth metacarpal bone of right hand 10/06/2016  . Benzodiazepine dependence, continuous (HCC) 01/09/2016  . History of panic attacks 09/11/2014  . Malaise 09/03/2014  . Fatigue 09/03/2014  . Body mass index (BMI) of 20.0-20.9 in adult 09/03/2014  . Right clavicle fracture  12/12/2013  . Clavicle fracture 11/07/2013    Past Surgical History:  Procedure Laterality Date  . CESAREAN SECTION    . CLAVICLE SURGERY    . TUBAL LIGATION       OB History    Gravida  2   Para  1   Term      Preterm      AB  1   Living  1     SAB  1   TAB      Ectopic      Multiple      Live Births               Home Medications    Prior to Admission medications   Medication Sig Start Date End Date Taking? Authorizing Provider  ALPRAZolam Prudy Feeler) 0.25 MG tablet Take 1 tablet by mouth 2 (two) times daily. 05/10/18   [provider]  calcium-vitamin D (OSCAL WITH D) 500-200 MG-UNIT tablet Take 1 tablet by mouth daily with breakfast. For bone health 01/10/16   Armandina Stammer I, NP  citalopram (CELEXA) 40 MG tablet Take 40 mg by mouth daily. 08/22/19   [provider]  cyclobenzaprine (FLEXERIL) 10 MG tablet Take 1 tablet (10 mg total) 3 (three) times daily by mouth. Patient not taking: Reported on 05/13/2018 09/12/17   Ivery Quale, PA-C  dexamethasone (DECADRON) 4 MG tablet Take 1 tablet (4 mg total) 2 (two) times daily with  a meal by mouth. Patient not taking: Reported on 05/13/2018 09/12/17   Ivery Quale, PA-C  diclofenac (VOLTAREN) 75 MG EC tablet Take 1 tablet (75 mg total) 2 (two) times daily by mouth. Patient not taking: Reported on 05/13/2018 09/12/17   Ivery Quale, PA-C  docusate sodium (COLACE) 100 MG capsule Take 1 capsule (100 mg total) by mouth 2 (two) times daily. 09/04/19   ,  E, PA-C  escitalopram (LEXAPRO) 10 MG tablet Take 1 tablet by mouth daily. 05/10/18   [provider]  HYDROcodone-acetaminophen (NORCO) 5-325 MG tablet Take 1 tablet by mouth every 4 (four) hours as needed for moderate pain. 05/13/18   Mancel Bale, MD  ibuprofen (ADVIL,MOTRIN) 600 MG tablet Take 1 tablet (600 mg total) by mouth every 6 (six) hours as needed. 06/13/17   Burgess Amor, PA-C  omega-3 acid ethyl esters (LOVAZA) 1 g capsule Take  1 capsule (1 g total) by mouth daily before breakfast. For high cholesterol Patient not taking: Reported on 05/13/2018 01/10/16   Armandina Stammer I, NP  polyethylene glycol powder (MIRALAX) 17 GM/SCOOP powder Take 255 g by mouth once for 1 dose. 09/04/19 09/04/19  ,  E, PA-C  potassium chloride (KLOR-CON) 10 MEQ tablet Take 1 tablet (10 mEq total) by mouth daily for 5 days. 09/04/19 09/09/19  ,  E, PA-C  terbinafine (LAMISIL) 250 MG tablet Take 1 tablet by mouth daily. 03/31/18   [provider]    Family History Family History  Problem Relation Age of Onset  . Diabetes Mother   . Pulmonary fibrosis Mother   . Heart attack Father   . Parkinson's disease Sister   . Bipolar disorder Sister   . Thyroid disease Sister   . Heart attack Brother   . Other Son        vascular neucrosis  . Pulmonary fibrosis Maternal Grandmother   . Diabetes Maternal Grandfather   . Stroke Maternal Grandfather   . Pneumonia Paternal Grandfather   . Cancer Sister        melonoma  . Other Brother        back problems, nerve problems, soft bones    Social History Social History   Tobacco Use  . Smoking status: Current Every Day Smoker    Packs/day: 0.50    Types: Cigarettes  . Smokeless tobacco: Never Used  . Tobacco comment: 2-3 cigarettes per day  Substance Use Topics  . Alcohol use: Not Currently    Alcohol/week: 1.0 standard drinks    Types: 1 Cans of beer per week    Comment: occ   . Drug use: Never    Types: "Crack" cocaine, Benzodiazepines     Allergies   Ampicillin   Review of Systems Review of Systems  Constitutional: Negative for chills and fever.  HENT: Negative for congestion, ear discharge, ear pain, sinus pressure, sinus pain and sore throat.   Eyes: Negative for pain and redness.  Respiratory: Negative for cough and shortness of breath.   Cardiovascular: Negative for chest pain.  Gastrointestinal: Positive for abdominal pain and  constipation. Negative for diarrhea, nausea and vomiting.  Genitourinary: Negative for dysuria and hematuria.  Musculoskeletal: Positive for back pain. Negative for neck pain.  Skin: Negative for wound.  Neurological: Negative for weakness, numbness and headaches.     Physical Exam Updated Vital Signs BP (!) 108/58   Pulse 84   Temp 98.2 F (36.8 C) (Oral)   Resp 18   Ht  (1.702 m)  Wt 70.3 kg   LMP  (LMP Unknown)   SpO2 98%   BMI 24.28 kg/m   Physical Exam Vitals signs and nursing note reviewed.  Constitutional:      General: She is not in acute distress.    Appearance: She is not ill-appearing.  HENT:     Head: Normocephalic and atraumatic.     Right Ear: Tympanic membrane and external ear normal.     Left Ear: Tympanic membrane and external ear normal.     Nose: Nose normal.     Mouth/Throat:     Mouth: Mucous membranes are moist.     Pharynx: Oropharynx is clear.  Eyes:     General: No scleral icterus.       Right eye: No discharge.        Left eye: No discharge.     Extraocular Movements: Extraocular movements intact.     Conjunctiva/sclera: Conjunctivae normal.     Pupils: Pupils are equal, round, and reactive to light.  Neck:     Musculoskeletal: Normal range of motion.     Vascular: No JVD.  Cardiovascular:     Rate and Rhythm: Normal rate and regular rhythm.     Pulses: Normal pulses.          Radial pulses are 2+ on the right side and 2+ on the left side.     Heart sounds: Normal heart sounds.  Pulmonary:     Comments: Lungs clear to auscultation in all fields. Symmetric chest rise. No wheezing, rales, or rhonchi. Abdominal:     General: Bowel sounds are normal.     Tenderness: There is generalized abdominal tenderness. There is no right CVA tenderness or left CVA tenderness.     Comments: Abdomen is soft, non-distended.. No rigidity, no guarding. No peritoneal signs.  Musculoskeletal: Normal range of motion.       Arms:     Right lower leg:  No edema.     Left lower leg: No edema.     Comments: Tenderness to palpation as depicted in image above.   Full range of motion of the T-spine and L-spine No tenderness to palpation of the spinous processes of the T-spine or L-spine No crepitus, deformity or step-offs. Positive straight leg raise on the left.  Skin:    General: Skin is warm and dry.     Capillary Refill: Capillary refill takes less than 2 seconds.  Neurological:     Mental Status: She is oriented to person, place, and time.     GCS: GCS eye subscore is 4. GCS verbal subscore is 5. GCS motor subscore is 6.     Comments: Fluent speech, no facial droop.  Sensation grossly intact to light touch in the lower extremities bilaterally. No saddle anesthesias. Strength 5/5 with flexion and extension at the bilateral hips, knees, and ankles. Pt walks with antalgic gait. Coordination intact with heel to shin testing.   Psychiatric:        Behavior: Behavior normal.      ED Treatments / Results  Labs (all labs ordered are listed, but only abnormal results are displayed) Labs Reviewed  COMPREHENSIVE METABOLIC PANEL - Abnormal; Notable for the following components:      Result Value   Potassium 3.2 (*)    Calcium 7.9 (*)    Albumin 2.9 (*)    ALT 110 (*)    Alkaline Phosphatase 149 (*)    Anion gap 4 (*)    All other  components within normal limits  CBC WITH DIFFERENTIAL/PLATELET - Abnormal; Notable for the following components:   RBC 3.29 (*)    Hemoglobin 10.9 (*)    HCT 33.7 (*)    MCV 102.4 (*)    RDW 15.7 (*)    All other components within normal limits  URINALYSIS, ROUTINE W REFLEX MICROSCOPIC - Abnormal; Notable for the following components:   Leukocytes,Ua SMALL (*)    All other components within normal limits  URINE CULTURE  LIPASE, BLOOD    EKG None  Radiology Dg Lumbar Spine Complete  Result Date: 09/04/2019 CLINICAL DATA:  Acute onset of LEFT low back pain. EXAM: LUMBAR SPINE - COMPLETE 4+  VIEW COMPARISON:  02/14/2018 and earlier. FINDINGS: Five non-rib-bearing lumbar vertebrae with anatomic alignment. No fractures. Slight straightening of the usual lordosis. Minimal spondylosis at L1-2, unchanged. Disc spaces well-preserved throughout. No pars defects or significant facet arthropathy. IMPRESSION: 1. No acute or subacute osseous abnormality. 2. Stable minimal spondylosis at L1-2. Electronically Signed   By: Evangeline Dakin M.D.   On: 09/04/2019 21:14   Dg Abd Acute W/chest  Result Date: 09/04/2019 CLINICAL DATA:  58 year old presenting with acute onset of vomiting and generalized weakness. Current smoker. EXAM: DG ABDOMEN ACUTE W/ 1V CHEST COMPARISON:  No prior abdominal x-ray. Chest x-ray 01/08/2016. FINDINGS: Bowel gas pattern unremarkable without evidence of obstruction or significant ileus. No evidence of free air or significant air-fluid levels on the erect image. Large stool burden in the colon. No visible opaque urinary tract calculi. Degenerative changes involving the lumbar spine. Suboptimal inspiration. Linear atelectasis in the lung bases bilaterally. No confluent airspace consolidation. Normal pulmonary vascularity. No visible pleural effusions. IMPRESSION: 1. No acute abdominal abnormality. 2. Suboptimal inspiration accounts for bibasilar atelectasis. No acute cardiopulmonary disease otherwise. 3. Large colonic stool burden. Electronically Signed   By: Evangeline Dakin M.D.   On: 09/04/2019 21:12    Procedures Procedures (including critical care time)  Medications Ordered in ED Medications  sodium chloride 0.9 % bolus 1,000 mL (1,000 mLs Intravenous New Bag/Given 09/04/19 2115)  ondansetron (ZOFRAN) injection 4 mg (4 mg Intravenous Given 09/04/19 2118)  morphine 4 MG/ML injection 4 mg (4 mg Intravenous Given 09/04/19 2119)  ketorolac (TORADOL) 30 MG/ML injection 30 mg (30 mg Intravenous Given 09/04/19 2212)     Initial Impression / Assessment and Plan / ED Course  I  have reviewed the triage vital signs and the nursing notes.  Pertinent labs & imaging results that were available during my care of the patient were reviewed by me and considered in my medical decision making (see chart for details).  Patient seen and examined. Patient nontoxic appearing, in no apparent distress, vitals WNL, afebrile.  Lungs are clear to auscultation all fields.  Se has generalized abdominal tenderness, no rigidity or guarding, no peritoneal signs.  No CVA tenderness.  She has tenderness to palpation of left paraspinal muscles.  Positive straight leg raise test on the left.  Suspicion for sciatic nerve pain.  Lower extremity neurological exam is unremarkable.  No findings to suggest cauda equina.  CBC overall unremarkable.  CMP with elevated ALT, mild hypokalemia. Lipase the normal range. UA shows small leukocytes, no WBC, no bacteria, nitrate negative.  Low suspicion for UTI but will send for culture. Xray of lumbar spine is negative for fracture dislocation. Xray of acute abdomen and chest viewed by me with large colonic stool burden. Pt offered enema in the ED, she states she would rather do this  at home as she has history of constipation.  Fluids given.  On reassessment patient reports feeling much improved after IV morphine.  Abdomen is nontender, no peritoneal signs, doubt acute surgical abdomen.  Again offered patient enema but she continues to refuse enema and rectal exam. She feels comfortable managing her constipation at home.  Will discharge home with prescription for short course of potassium, Miralax and Colace.  Patient is tolerating p.o. intake while in the emergency department.  The patient appears reasonably screened and/or stabilized for discharge and I doubt any other medical condition or other [facility] requiring further screening, evaluation, or treatment in the ED at this time prior to discharge. The patient is safe for discharge with strict return precautions discussed.  Recommend pcp follow up to have labs rechecked including liver enzymes and potassium.   Portions of this note were generated with Scientist, clinical (histocompatibility and immunogenetics). Dictation errors may occur despite best attempts at proofreading.    Final Clinical Impressions(s) / ED Diagnoses   Final diagnoses:  Constipation, unspecified constipation type  Sciatica of left side  Hypokalemia    ED Discharge Orders         Ordered    docusate sodium (COLACE) 100 MG capsule  2 times daily     09/04/19 2156    polyethylene glycol powder (MIRALAX) 17 GM/SCOOP powder   Once     09/04/19 2156    potassium chloride (KLOR-CON) 10 MEQ tablet  Daily     09/04/19 2213           Kathyrn Lass 09/04/19 2223    Maia Plan, MD 09/05/19 1053

## 2019-09-05 DIAGNOSIS — M545 Low back pain: Secondary | ICD-10-CM | POA: Diagnosis not present

## 2019-09-05 DIAGNOSIS — R519 Headache, unspecified: Secondary | ICD-10-CM | POA: Diagnosis not present

## 2019-09-05 DIAGNOSIS — F321 Major depressive disorder, single episode, moderate: Secondary | ICD-10-CM | POA: Diagnosis not present

## 2019-09-05 DIAGNOSIS — K5900 Constipation, unspecified: Secondary | ICD-10-CM | POA: Diagnosis not present

## 2019-09-06 LAB — URINE CULTURE: Culture: <10000 — AB

## 2019-09-12 ENCOUNTER — Other Ambulatory Visit (HOSPITAL_COMMUNITY): Payer: Self-pay | Admitting: Pulmonary Disease

## 2019-09-12 ENCOUNTER — Emergency Department (HOSPITAL_COMMUNITY)
Admission: EM | Admit: 2019-09-12 | Discharge: 2019-09-13 | Disposition: A | Discharge status: Home / Self Care | Payer: BC Managed Care – PPO | Attending: Emergency Medicine | Admitting: Emergency Medicine

## 2019-09-12 ENCOUNTER — Emergency Department (HOSPITAL_COMMUNITY): Payer: BC Managed Care – PPO

## 2019-09-12 ENCOUNTER — Other Ambulatory Visit: Payer: Self-pay

## 2019-09-12 ENCOUNTER — Other Ambulatory Visit: Payer: Self-pay | Admitting: Pulmonary Disease

## 2019-09-12 ENCOUNTER — Telehealth: Payer: Self-pay | Admitting: *Deleted

## 2019-09-12 ENCOUNTER — Encounter (HOSPITAL_COMMUNITY): Payer: Self-pay | Admitting: Emergency Medicine

## 2019-09-12 DIAGNOSIS — S199XXA Unspecified injury of neck, initial encounter: Secondary | ICD-10-CM | POA: Diagnosis not present

## 2019-09-12 DIAGNOSIS — R220 Localized swelling, mass and lump, head: Secondary | ICD-10-CM | POA: Diagnosis not present

## 2019-09-12 DIAGNOSIS — Z79899 Other long term (current) drug therapy: Secondary | ICD-10-CM | POA: Insufficient documentation

## 2019-09-12 DIAGNOSIS — F1410 Cocaine abuse, uncomplicated: Secondary | ICD-10-CM | POA: Insufficient documentation

## 2019-09-12 DIAGNOSIS — F17210 Nicotine dependence, cigarettes, uncomplicated: Secondary | ICD-10-CM | POA: Insufficient documentation

## 2019-09-12 DIAGNOSIS — M545 Low back pain, unspecified: Secondary | ICD-10-CM

## 2019-09-12 DIAGNOSIS — Y999 Unspecified external cause status: Secondary | ICD-10-CM | POA: Diagnosis not present

## 2019-09-12 DIAGNOSIS — R0689 Other abnormalities of breathing: Secondary | ICD-10-CM | POA: Diagnosis not present

## 2019-09-12 DIAGNOSIS — G44019 Episodic cluster headache, not intractable: Secondary | ICD-10-CM

## 2019-09-12 DIAGNOSIS — S0093XA Contusion of unspecified part of head, initial encounter: Secondary | ICD-10-CM | POA: Diagnosis not present

## 2019-09-12 DIAGNOSIS — Y929 Unspecified place or not applicable: Secondary | ICD-10-CM | POA: Insufficient documentation

## 2019-09-12 DIAGNOSIS — S0990XA Unspecified injury of head, initial encounter: Secondary | ICD-10-CM

## 2019-09-12 DIAGNOSIS — Y939 Activity, unspecified: Secondary | ICD-10-CM | POA: Diagnosis not present

## 2019-09-12 DIAGNOSIS — R519 Headache, unspecified: Secondary | ICD-10-CM | POA: Diagnosis not present

## 2019-09-12 DIAGNOSIS — R52 Pain, unspecified: Secondary | ICD-10-CM | POA: Diagnosis not present

## 2019-09-12 DIAGNOSIS — F439 Reaction to severe stress, unspecified: Secondary | ICD-10-CM | POA: Diagnosis not present

## 2019-09-12 DIAGNOSIS — F329 Major depressive disorder, single episode, unspecified: Secondary | ICD-10-CM | POA: Diagnosis not present

## 2019-09-12 DIAGNOSIS — F419 Anxiety disorder, unspecified: Secondary | ICD-10-CM | POA: Diagnosis not present

## 2019-09-12 DIAGNOSIS — I10 Essential (primary) hypertension: Secondary | ICD-10-CM | POA: Diagnosis not present

## 2019-09-12 DIAGNOSIS — M542 Cervicalgia: Secondary | ICD-10-CM | POA: Diagnosis not present
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
## 2019-09-13 DIAGNOSIS — R519 Headache, unspecified: Secondary | ICD-10-CM | POA: Diagnosis not present

## 2019-09-13 DIAGNOSIS — S199XXA Unspecified injury of neck, initial encounter: Secondary | ICD-10-CM | POA: Diagnosis not present

## 2019-09-13 DIAGNOSIS — R220 Localized swelling, mass and lump, head: Secondary | ICD-10-CM | POA: Diagnosis not present

## 2019-09-13 DIAGNOSIS — M542 Cervicalgia: Secondary | ICD-10-CM | POA: Diagnosis not present

## 2019-09-19 DIAGNOSIS — F439 Reaction to severe stress, unspecified: Secondary | ICD-10-CM | POA: Diagnosis not present

## 2019-09-26 ENCOUNTER — Other Ambulatory Visit: Payer: Self-pay

## 2019-09-26 ENCOUNTER — Emergency Department (HOSPITAL_COMMUNITY)
Admission: EM | Admit: 2019-09-26 | Discharge: 2019-09-26 | Discharge status: Absent without leave | Payer: BC Managed Care – PPO

## 2019-10-19 ENCOUNTER — Emergency Department (HOSPITAL_COMMUNITY)
Admission: EM | Admit: 2019-10-19 | Discharge: 2019-10-19 | Disposition: A | Discharge status: Home / Self Care | Payer: BC Managed Care – PPO | Attending: Emergency Medicine | Admitting: Emergency Medicine

## 2019-10-19 ENCOUNTER — Emergency Department (HOSPITAL_COMMUNITY): Payer: BC Managed Care – PPO

## 2019-10-19 ENCOUNTER — Encounter (HOSPITAL_COMMUNITY): Payer: Self-pay | Admitting: *Deleted

## 2019-10-19 ENCOUNTER — Other Ambulatory Visit: Payer: Self-pay

## 2019-10-19 DIAGNOSIS — Y9389 Activity, other specified: Secondary | ICD-10-CM | POA: Diagnosis not present

## 2019-10-19 DIAGNOSIS — Z79899 Other long term (current) drug therapy: Secondary | ICD-10-CM | POA: Insufficient documentation

## 2019-10-19 DIAGNOSIS — Y999 Unspecified external cause status: Secondary | ICD-10-CM | POA: Insufficient documentation

## 2019-10-19 DIAGNOSIS — S42202A Unspecified fracture of upper end of left humerus, initial encounter for closed fracture: Secondary | ICD-10-CM | POA: Insufficient documentation

## 2019-10-19 DIAGNOSIS — M542 Cervicalgia: Secondary | ICD-10-CM | POA: Diagnosis not present

## 2019-10-19 DIAGNOSIS — S4992XA Unspecified injury of left shoulder and upper arm, initial encounter: Secondary | ICD-10-CM | POA: Diagnosis not present

## 2019-10-19 DIAGNOSIS — W19XXXA Unspecified fall, initial encounter: Secondary | ICD-10-CM

## 2019-10-19 DIAGNOSIS — R519 Headache, unspecified: Secondary | ICD-10-CM | POA: Diagnosis not present

## 2019-10-19 DIAGNOSIS — S299XXA Unspecified injury of thorax, initial encounter: Secondary | ICD-10-CM | POA: Diagnosis not present

## 2019-10-19 DIAGNOSIS — Y9289 Other specified places as the place of occurrence of the external cause: Secondary | ICD-10-CM | POA: Diagnosis not present

## 2019-10-19 DIAGNOSIS — S0990XA Unspecified injury of head, initial encounter: Secondary | ICD-10-CM | POA: Diagnosis not present

## 2019-10-19 DIAGNOSIS — S199XXA Unspecified injury of neck, initial encounter: Secondary | ICD-10-CM | POA: Diagnosis not present

## 2019-10-19 DIAGNOSIS — F17210 Nicotine dependence, cigarettes, uncomplicated: Secondary | ICD-10-CM | POA: Diagnosis not present

## 2019-10-19 DIAGNOSIS — W108XXA Fall (on) (from) other stairs and steps, initial encounter: Secondary | ICD-10-CM | POA: Insufficient documentation

## 2019-10-19 DIAGNOSIS — S42302A Unspecified fracture of shaft of humerus, left arm, initial encounter for closed fracture: Secondary | ICD-10-CM | POA: Diagnosis not present

## 2019-10-19 DIAGNOSIS — S42292A Other displaced fracture of upper end of left humerus, initial encounter for closed fracture: Secondary | ICD-10-CM | POA: Diagnosis not present

## 2019-10-19 DIAGNOSIS — M546 Pain in thoracic spine: Secondary | ICD-10-CM | POA: Diagnosis not present

## 2019-10-19 MED ORDER — OXYCODONE-ACETAMINOPHEN 5-325 MG PO TABS
1.0000 | ORAL_TABLET | Freq: Once | ORAL | Status: AC
  Administered 2019-10-19: 1 via ORAL
  Filled 2019-10-19: qty 1

## 2019-10-19 MED ORDER — OXYCODONE-ACETAMINOPHEN 5-325 MG PO TABS: 1.0000 | ORAL_TABLET | Freq: Four times a day (QID) | ORAL | 0 refills | Status: DC | PRN

## 2019-10-19 MED ORDER — OXYCODONE-ACETAMINOPHEN 5-325 MG PO TABS: 1.0000 | ORAL_TABLET | ORAL | 0 refills | Status: DC | PRN

## 2019-10-19 NOTE — ED Provider Notes (Signed)
[facility] Provider Note   CSN: [id number] Arrival date & time: 10/19/19  1736     History Chief Complaint  Patient presents with  . Fall    Casey [name] is a 58 y.o. female.  HPI   58 year old female with a history of anxiety/depression, chronic back pain, panic attacks, suicide attempt, who presents emergency department today for evaluation of fall.  States that she was on the third step on a stairway with 6 steps walking down the stairs when her shoes slipped and she fell backwards.  States she hit her head but did not pass out.  She is complaining of some upper back pain/neck pain and left shoulder pain.  She denies other injuries.  She denies any drug or alcohol use tonight.  She received 200 MCG's fentanyl in route with EMS.  Past Medical History:  Diagnosis Date  . Anxiety   . Chronic back pain   . Depression   . History of panic attacks 09/11/2014  . Medical history non-contributory   . Ruptured disc, thoracic   . Suicide attempt [facility]) 2002    Patient Active Problem List   Diagnosis Date Noted  . Fracture of base of fifth metacarpal bone of right hand 10/06/2016  . Benzodiazepine dependence, continuous ([facility]) 01/09/2016  . History of panic attacks 09/11/2014  . Malaise 09/03/2014  . Fatigue 09/03/2014  . Body mass index (BMI) of 20.0-20.9 in adult 09/03/2014  . Right clavicle fracture 12/12/2013  . Clavicle fracture 11/07/2013    Past Surgical History:  Procedure Laterality Date  . CESAREAN SECTION    . CLAVICLE SURGERY    . TUBAL LIGATION       OB History    Gravida  2   Para  1   Term      Preterm      AB  1   Living  1     SAB  1   TAB      Ectopic      Multiple      Live Births              Family History  Problem Relation Age of Onset  . Diabetes Mother   . Pulmonary fibrosis Mother   . Heart attack Father   . Parkinson's disease Sister   . Bipolar disorder Sister   . Thyroid disease Sister   .  Heart attack Brother   . Other Son        vascular neucrosis  . Pulmonary fibrosis Maternal Grandmother   . Diabetes Maternal Grandfather   . Stroke Maternal Grandfather   . Pneumonia Paternal Grandfather   . Cancer Sister        melonoma  . Other Brother        back problems, nerve problems, soft bones    Social History   Tobacco Use  . Smoking status: Current Every Day Smoker    Packs/day: 0.50    Types: Cigarettes  . Smokeless tobacco: Never Used  . Tobacco comment: 2-3 cigarettes per day  Substance Use Topics  . Alcohol use: Not Currently    Alcohol/week: 1.0 standard drinks    Types: 1 Cans of beer per week    Comment: occ   . Drug use: Never    Types: "Crack" cocaine, Benzodiazepines    Home Medications Prior to Admission medications   Medication Sig Start Date End Date Taking? Authorizing Provider  ALPRAZolam Duanne Moron) 0.25 MG tablet Take 1  tablet by mouth 2 (two) times daily. 05/10/18   [provider]  calcium-vitamin D (OSCAL WITH D) 500-200 MG-UNIT tablet Take 1 tablet by mouth daily with breakfast. For bone health 01/10/16   Armandina StammerNwoko, Agnes I, NP  citalopram (CELEXA) 40 MG tablet Take 40 mg by mouth daily. 08/22/19   [provider]  cyclobenzaprine (FLEXERIL) 10 MG tablet Take 1 tablet (10 mg total) 3 (three) times daily by mouth. Patient not taking: Reported on 05/13/2018 09/12/17   Ivery QualeBryant, Hobson, PA-C  dexamethasone (DECADRON) 4 MG tablet Take 1 tablet (4 mg total) 2 (two) times daily with a meal by mouth. Patient not taking: Reported on 05/13/2018 09/12/17   Ivery QualeBryant, Hobson, PA-C  diclofenac (VOLTAREN) 75 MG EC tablet Take 1 tablet (75 mg total) 2 (two) times daily by mouth. Patient not taking: Reported on 05/13/2018 09/12/17   Ivery QualeBryant, Hobson, PA-C  docusate sodium (COLACE) 100 MG capsule Take 1 capsule (100 mg total) by mouth 2 (two) times daily. 09/04/19   Albrizze, Kaitlyn E, PA-C  escitalopram (LEXAPRO) 10 MG tablet Take 1 tablet by mouth daily. 05/10/18    [provider]  HYDROcodone-acetaminophen (NORCO) 5-325 MG tablet Take 1 tablet by mouth every 4 (four) hours as needed for moderate pain. 05/13/18   Mancel BaleWentz, Elliott, MD  ibuprofen (ADVIL,MOTRIN) 600 MG tablet Take 1 tablet (600 mg total) by mouth every 6 (six) hours as needed. 06/13/17   Burgess AmorIdol, Julie, PA-C  omega-3 acid ethyl esters (LOVAZA) 1 g capsule Take 1 capsule (1 g total) by mouth daily before breakfast. For high cholesterol Patient not taking: Reported on 05/13/2018 01/10/16   Armandina StammerNwoko, Agnes I, NP  oxyCODONE-acetaminophen (PERCOCET/ROXICET) 5-325 MG tablet Take 1 tablet by mouth every 6 (six) hours as needed for severe pain. 10/19/19   ,  S, PA-C  potassium chloride (KLOR-CON) 10 MEQ tablet Take 1 tablet (10 mEq total) by mouth daily for 5 days. 09/04/19 09/09/19  Albrizze, Kaitlyn E, PA-C  terbinafine (LAMISIL) 250 MG tablet Take 1 tablet by mouth daily. 03/31/18   [provider]    Allergies    Ampicillin  Review of Systems   Review of Systems  Constitutional: Negative for fever.  HENT: Negative for ear pain.   Eyes: Negative for visual disturbance.  Respiratory: Negative for shortness of breath.   Cardiovascular: Negative for chest pain.  Gastrointestinal: Negative for abdominal pain, nausea and vomiting.  Genitourinary: Negative for flank pain.  Musculoskeletal: Positive for back pain and neck pain.  Skin: Negative for rash.  Neurological: Negative for weakness and numbness.       + head trauma, no loc  All other systems reviewed and are negative.   Physical Exam Updated Vital Signs BP 134/88 (BP Location: Right Arm)   Pulse 86   Temp 98.2 F (36.8 C) (Oral)   Resp 18   Ht 5' 6.5" (1.689 m)   Wt 68.9 kg   LMP  (LMP Unknown)   SpO2 97%   BMI 24.17 kg/m   Physical Exam Vitals and nursing note reviewed.  Constitutional:      General: She is not in acute distress.    Appearance: She is well-developed.  HENT:     Head: Normocephalic and  atraumatic.  Eyes:     Conjunctiva/sclera: Conjunctivae normal.  Cardiovascular:     Rate and Rhythm: Normal rate and regular rhythm.     Heart sounds: No murmur.  Pulmonary:     Effort: Pulmonary effort is normal. No  respiratory distress.     Breath sounds: Normal breath sounds.  Abdominal:     Palpations: Abdomen is soft.     Tenderness: There is no abdominal tenderness.  Musculoskeletal:     Cervical back: Neck supple.     Comments: TTP to the cervical spine and mid thoracic spine. No lumbar TTP. TTP to the left shoulder with obvious deformity.   Skin:    General: Skin is warm and dry.  Neurological:     Mental Status: She is alert.     ED Results / Procedures / Treatments   Labs (all labs ordered are listed, but only abnormal results are displayed) Labs Reviewed - No data to display  EKG None  Radiology DG Thoracic Spine 2 View  Result Date: 10/19/2019 CLINICAL DATA:  Status post fall 2 hours ago with back pain. EXAM: THORACIC SPINE 2 VIEWS COMPARISON:  None. FINDINGS: There is no evidence of thoracic spine fracture. There is mild scoliosis of spine. Minimal degenerative joint changes are noted. IMPRESSION: No acute fracture or dislocation. Electronically Signed   By: Sherian Rein M.D.   On: 10/19/2019 19:12   CT Head Wo Contrast  Result Date: 10/19/2019 CLINICAL DATA:  Recent fall with humeral fracture and headaches and neck pain, initial encounter EXAM: CT HEAD WITHOUT CONTRAST CT CERVICAL SPINE WITHOUT CONTRAST TECHNIQUE: Multidetector CT imaging of the head and cervical spine was performed following the standard protocol without intravenous contrast. Multiplanar CT image reconstructions of the cervical spine were also generated. COMPARISON:  09/12/2019 FINDINGS: CT HEAD FINDINGS Brain: No evidence of acute infarction, hemorrhage, hydrocephalus, extra-axial collection or mass lesion/mass effect. Vascular: No hyperdense vessel or unexpected calcification. Skull: Normal.  Negative for fracture or focal lesion. Sinuses/Orbits: No acute finding. Other: None. CT CERVICAL SPINE FINDINGS Alignment: Within normal limits. Skull base and vertebrae: 7 cervical segments are well visualized. Vertebral body height is well maintained. No acute fracture or acute facet abnormality is noted. The odontoid is within normal limits. Soft tissues and spinal canal: Surrounding soft tissue structures are within normal limits. Upper chest: Visualized lung apices are unremarkable. Other: None IMPRESSION: CT of the head: No acute intracranial abnormality noted. CT of the cervical spine: No acute abnormality noted. Electronically Signed   By: Alcide Clever M.D.   On: 10/19/2019 19:30   CT Cervical Spine Wo Contrast  Result Date: 10/19/2019 CLINICAL DATA:  Recent fall with humeral fracture and headaches and neck pain, initial encounter EXAM: CT HEAD WITHOUT CONTRAST CT CERVICAL SPINE WITHOUT CONTRAST TECHNIQUE: Multidetector CT imaging of the head and cervical spine was performed following the standard protocol without intravenous contrast. Multiplanar CT image reconstructions of the cervical spine were also generated. COMPARISON:  09/12/2019 FINDINGS: CT HEAD FINDINGS Brain: No evidence of acute infarction, hemorrhage, hydrocephalus, extra-axial collection or mass lesion/mass effect. Vascular: No hyperdense vessel or unexpected calcification. Skull: Normal. Negative for fracture or focal lesion. Sinuses/Orbits: No acute finding. Other: None. CT CERVICAL SPINE FINDINGS Alignment: Within normal limits. Skull base and vertebrae: 7 cervical segments are well visualized. Vertebral body height is well maintained. No acute fracture or acute facet abnormality is noted. The odontoid is within normal limits. Soft tissues and spinal canal: Surrounding soft tissue structures are within normal limits. Upper chest: Visualized lung apices are unremarkable. Other: None IMPRESSION: CT of the head: No acute intracranial  abnormality noted. CT of the cervical spine: No acute abnormality noted. Electronically Signed   By: Alcide Clever M.D.   On: 10/19/2019 19:30  DG Shoulder Left  Result Date: 10/19/2019 CLINICAL DATA:  Status post fall landed on left shoulder 2 hours ago with pain. EXAM: LEFT SHOULDER - 2+ VIEW COMPARISON:  None. FINDINGS: There is comminuted displaced fracture of the left humeral head and neck. Associated soft tissue swelling is identified. IMPRESSION: Fracture of proximal left humerus. Electronically Signed   By: Sherian Rein M.D.   On: 10/19/2019 19:11    Procedures Procedures (including critical care time)  Medications Ordered in ED Medications  oxyCODONE-acetaminophen (PERCOCET/ROXICET) 5-325 MG per tablet 1 tablet (1 tablet Oral Given 10/19/19 2110)    ED Course  I have reviewed the triage vital signs and the nursing notes.  Pertinent labs & imaging results that were available during my care of the patient were reviewed by me and considered in my medical decision making (see chart for details).    MDM Rules/Calculators/A&P       58 year old female presenting for evaluation after mechanical fall down the steps prior to arrival.  She was on the third step on a staircase of 6 stairs.  Slipped and fell backwards hitting her head.   No LOC. Vital signs within normal limits.  Normal neuro exam.  She does have midline tenderness to the cervical and thoracic spine.  She also has tenderness and obvious deformity to the left shoulder.  Will obtain imaging and reassess.    Final Clinical Impression(s) / ED Diagnoses Final diagnoses:  Fall, initial encounter  Closed fracture of proximal end of left humerus, unspecified fracture morphology, initial encounter   58 y/o female presenting for eval after mechanical fall. C/o pain to the left shoulder and neck. Sustained head trauma. No loc. Normal neuro exam.   Ct head/cervical spine are negative for traumatic injury Xray thoracic spine neg  for traumatic injury Xray left shoulder with a proximal left humerus fracture  8:45 PM CONSULT with Dr. Romeo Apple with orthopedics who recommends sling immobilization and f/u in the office.  Pt placed in sling. Discussed plan for f/u in the office next week. Advised on return precautions. She voices understanding and is in agreement with plan. All questions answered, pt stable for d/c.    Rx / DC Orders ED Discharge Orders         Ordered    oxyCODONE-acetaminophen (PERCOCET/ROXICET) 5-325 MG tablet  Every 6 hours PRN     10/19/19 2203           Karrie Meres, PA-C 10/19/19 2206    Vanetta Mulders, MD 10/23/19 2307

## 2019-10-19 NOTE — Discharge Instructions (Addendum)
Prescription given for Percocet. Take medication as directed and do not operate machinery, drive a car, or work while taking this medication as it can make you drowsy.   Please follow up with your orthopedic doctor or with Dr. Aline Brochure within 5-7 days for re-evaluation of your symptoms.  Please return to the emergency department for any new or worsening symptoms.

## 2019-10-19 NOTE — ED Triage Notes (Signed)
Arrival via ems with ? Dislocated left shoulder from a fall about 2 hours ago. Pt reports she slipped down 2 steps and fell backwards on that shoulder

## 2019-10-24 ENCOUNTER — Other Ambulatory Visit: Payer: Self-pay

## 2019-10-24 ENCOUNTER — Encounter (HOSPITAL_COMMUNITY): Payer: Self-pay

## 2019-10-24 ENCOUNTER — Ambulatory Visit (HOSPITAL_COMMUNITY): Admission: RE | Admit: 2019-10-24 | Payer: BC Managed Care – PPO | Source: Ambulatory Visit

## 2019-10-24 ENCOUNTER — Ambulatory Visit (HOSPITAL_COMMUNITY): Payer: BC Managed Care – PPO

## 2019-10-24 ENCOUNTER — Ambulatory Visit: Payer: BC Managed Care – PPO | Admitting: Orthopedic Surgery

## 2019-10-24 ENCOUNTER — Encounter: Payer: Self-pay | Admitting: Orthopedic Surgery

## 2019-10-24 VITALS — BP 135/87 | HR 102 | Ht 64.0 in | Wt 145.0 lb

## 2019-10-24 DIAGNOSIS — S42202A Unspecified fracture of upper end of left humerus, initial encounter for closed fracture: Secondary | ICD-10-CM | POA: Diagnosis not present

## 2019-10-24 MED ORDER — OXYCODONE-ACETAMINOPHEN 5-325 MG PO TABS: 1.0000 | ORAL_TABLET | ORAL | 0 refills | Status: AC | PRN

## 2019-10-24 NOTE — Progress Notes (Signed)
Casey [name]  10/24/2019  Body mass index is 24.89 kg/m.   HISTORY SECTION :  Chief Complaint  Patient presents with  . Shoulder Injury    left shoulder fracture 10/19/2019 fall down stairs    58 yo female multiple histories of orthopedic injuries fell down the stairs on 10 December comes in complaining of severe pain in her left shoulder after sustaining a proximal humerus fracture.  She says the pain is so severe she rather be dead.  She also has some intermittent spasms which cause pain and she has some associated bruising around the arm and elbow.  She is in a sling and swathe    Review of Systems  Constitutional: Negative for chills and fever.  Neurological: Negative for tingling.  Psychiatric/Behavioral: The patient is nervous/anxious.      has a past medical history of Anxiety, Chronic back pain, Depression, History of panic attacks (09/11/2014), Medical history non-contributory, Ruptured disc, thoracic, and Suicide attempt ([place]) (2002).   Past Surgical History:  Procedure Laterality Date  . CESAREAN SECTION    . CLAVICLE SURGERY    . TUBAL LIGATION      Body mass index is 24.89 kg/m.   Allergies  Allergen Reactions  . Ampicillin Other (See Comments)    Reaction is unknown     Current Outpatient Medications:  .  ALPRAZolam (XANAX) 0.25 MG tablet, Take 1 tablet by mouth 2 (two) times daily., Disp: , Rfl: 2 .  calcium-vitamin D (OSCAL WITH D) 500-200 MG-UNIT tablet, Take 1 tablet by mouth daily with breakfast. For bone health, Disp: , Rfl:  .  citalopram (CELEXA) 40 MG tablet, Take 40 mg by mouth daily., Disp: , Rfl:  .  ibuprofen (ADVIL,MOTRIN) 600 MG tablet, Take 1 tablet (600 mg total) by mouth every 6 (six) hours as needed., Disp: 30 tablet, Rfl: 0 .  omega-3 acid ethyl esters (LOVAZA) 1 g capsule, Take 1 capsule (1 g total) by mouth daily before breakfast. For high cholesterol, Disp: 30 capsule, Rfl: 0 .  potassium chloride (KLOR-CON) 10 MEQ tablet, Take 1  tablet (10 mEq total) by mouth daily for 5 days., Disp: 5 tablet, Rfl: 0 .  terbinafine (LAMISIL) 250 MG tablet, Take 1 tablet by mouth daily., Disp: , Rfl: 2 .  escitalopram (LEXAPRO) 10 MG tablet, Take 1 tablet by mouth daily., Disp: , Rfl: 4 .  oxyCODONE-acetaminophen (PERCOCET/ROXICET) 5-325 MG tablet, Take 1 tablet by mouth every 4 (four) hours as needed for up to 5 days for severe pain., Disp: 30 tablet, Rfl: 0   PHYSICAL EXAM SECTION: 1) BP 135/87   Pulse (!) 102   Ht 5\' 4"  (1.626 m)   Wt 145 lb (65.8 kg)   LMP  (LMP Unknown)   BMI 24.89 kg/m   Body mass index is 24.89 kg/m. General appearance: Well-developed well-nourished no gross deformities  2) Cardiovascular normal pulse and perfusion in the upper extremities normal color without edema  3) Neurologically deep tendon reflexes are equal and normal, no sensation loss or deficits no pathologic reflexes  4) Psychological: Awake alert and oriented x3 mood and affect normal  5) Skin no lacerations or ulcerations no nodularity no palpable masses, no erythema or nodularity  6) Musculoskeletal:  Left shoulder Tender proximal humerus minimal swelling skin is somewhat discolored motion is deferred for pain overall alignment looks good  Right shoulder:  No tenderness, normal range of motion, stable without dislocation subluxation atrophy or tremor   MEDICAL DECISION SECTION:  Encounter Diagnosis  Name Primary?  . Closed fracture of proximal end of left humerus, unspecified fracture morphology, initial encounter Yes    Imaging Outside images independently interpreted by me.  Proximal humerus fracture with a greater tuberosity fragment which is right at the borderline of 1 cm.   Plan:  (Rx., Inj., surg., Frx, MRI/CT, XR:42)  58 year old female smoker anxious appears to do really poorly with pain is not a good surgical candidate recommend nonoperative treatment x-ray in 2 weeks  Meds ordered this encounter  Medications  .  oxyCODONE-acetaminophen (PERCOCET/ROXICET) 5-325 MG tablet    Sig: Take 1 tablet by mouth every 4 (four) hours as needed for up to 5 days for severe pain.    Dispense:  30 tablet    Refill:  0     2:44 PM Fuller Canada, MD  10/24/2019

## 2019-10-24 NOTE — Patient Instructions (Signed)
Humerus Fracture Treated With Immobilization  The humerus is the large bone in the upper arm. A broken (fractured) humerus is often treated by wearing a cast, splint, or sling (immobilization). This holds the broken pieces in place so they can heal. What are the causes? This condition may be caused by:  A fall.  A hard, direct hit to the arm.  A car accident. What increases the risk? You are more likely to develop this condition if:  You are elderly.  You have a disease that makes the bones thin and weak. What are the signs or symptoms?  Pain.  Swelling.  Bruising.  Not being able to move your arm normally. How is this treated? Treatment involves wearing a cast, splint, or sling until your arm heals enough for you to begin range-of-motion exercises. You may also be prescribed pain medicine. Follow these instructions at home: If you have a cast:  Do not stick anything inside the cast to scratch your skin.  Check the skin around the cast every day. Tell your doctor if you have any concerns.  You may put lotion on dry skin around the edges of the cast. Do not put lotion on the skin under the cast.  Keep the cast clean and dry. If you have a splint or sling:  Wear the splint or sling as told by your doctor. Remove it only as told by your doctor.  Loosen the splint or sling if your fingers: ? Tingle. ? Become numb. ? Turn cold and blue.  Keep the splint or sling clean and dry. Bathing  Do not take baths, swim, or use a hot tub until your doctor says that you can. Ask your doctor if you may take showers. You may only be allowed to take sponge baths.  If your cast, splint, or sling is not waterproof: ? Do not let it get wet. ? Cover it with a watertight covering when you take a bath or shower.  If you have a sling, remove it for bathing only if your doctor says this is okay. Managing pain, stiffness, and swelling   If told, put ice on the injured area. ? If you  have a removable splint or sling, remove it as told by your doctor. ? Put ice in a plastic bag. ? Place a towel between your skin and the bag or between your cast and the bag. ? Leave the ice on for 20 minutes, 2-3 times a day.  Move your fingers often.  Raise (elevate) the injured area above the level of your heart while you are sitting or lying down. Driving  Do not drive or use heavy machinery while taking prescription pain medicine.  Do not drive while wearing a cast, splint, or sling on an arm that you use for driving. Activity  Return to your normal activities as told by your doctor. Ask your doctor what activities are safe for you.  Do not lift anything until your doctor says that it is safe.  Do range-of-motion exercises only as told by your doctor. General instructions  Do not put pressure on any part of the cast or splint until it is fully hardened. This may take many hours.  Do not use any products that contain nicotine or tobacco, such as cigarettes, e-cigarettes, and chewing tobacco. These can delay bone healing. If you need help quitting, ask your doctor.  Take over-the-counter and prescription medicines only as told by your doctor.  Ask your doctor if the medicine   you are taking can cause trouble pooping (constipation). You may need to take steps to prevent or treat trouble pooping: ? Drink enough fluid to keep your pee (urine) pale yellow. ? Take over-the-counter or prescription medicines. ? Eat foods that are high in fiber. These include beans, whole grains, and fresh fruits and vegetables. ? Limit foods that are high in fat and sugar. These include fried or sweet foods.  Keep all follow-up visits as told by your doctor. This is important. Contact a doctor if:  You have any new pain, swelling, or bruising.  Your pain, swelling, and bruising do not get better.  Your cast, splint, or sling becomes loose or damaged. Get help right away if:  Your skin or  fingers on your injured arm turn blue or [place].  Your arm is cold or numb.  You have very bad pain in your injured arm. Summary  The humerus is the large bone in the upper arm.  A broken humerus is often treated by wearing a cast, splint, or sling.  Wear a splint or sling as told by your doctor. Remove it only as told by your doctor.  Move your fingers often. This information is not intended to replace advice given to you by your health care provider. Make sure you discuss any questions you have with your health care provider. Document Released: 04/13/2008 Document Revised: 06/27/2018 Document Reviewed: 06/27/2018 Elsevier Patient Education  2020 Elsevier Inc.  

## 2019-10-25 ENCOUNTER — Ambulatory Visit: Payer: BC Managed Care – PPO | Admitting: Orthopedic Surgery

## 2019-10-27 DIAGNOSIS — F329 Major depressive disorder, single episode, unspecified: Secondary | ICD-10-CM | POA: Diagnosis not present

## 2019-10-30 ENCOUNTER — Other Ambulatory Visit: Payer: Self-pay

## 2019-10-30 MED ORDER — HYDROCODONE-ACETAMINOPHEN 5-325 MG PO TABS: 1.0000 | ORAL_TABLET | ORAL | 0 refills | Status: DC | PRN

## 2019-10-30 NOTE — Telephone Encounter (Signed)
Oxycodone-Acetaminophen  5/325 mg  Qty 30 tablets  Take 1 tablet by mouth every 4 (four) hours as needed for up to 5 days for severe pain.  PATIENT USES Casey [name]

## 2019-11-06 ENCOUNTER — Other Ambulatory Visit: Payer: Self-pay | Admitting: Orthopedic Surgery

## 2019-11-06 MED ORDER — HYDROCODONE-ACETAMINOPHEN 5-325 MG PO TABS: 1.0000 | ORAL_TABLET | ORAL | 0 refills | Status: DC | PRN

## 2019-11-06 NOTE — Telephone Encounter (Signed)
Patient requests refill on Hydrocodone/Acetaminophen 5-325  Mgs.  Qty  30  Sig: Take 1 tablet by mouth every 4 (four) hours as needed for moderate pain.  [facility] [place]

## 2019-11-08 ENCOUNTER — Other Ambulatory Visit: Payer: Self-pay

## 2019-11-08 ENCOUNTER — Ambulatory Visit: Payer: BC Managed Care – PPO | Admitting: Orthopedic Surgery

## 2019-11-08 ENCOUNTER — Ambulatory Visit (INDEPENDENT_AMBULATORY_CARE_PROVIDER_SITE_OTHER): Payer: BC Managed Care – PPO

## 2019-11-08 ENCOUNTER — Encounter: Payer: Self-pay | Admitting: Orthopedic Surgery

## 2019-11-08 VITALS — BP 128/82 | HR 88 | Temp 97.2°F | Ht 64.0 in | Wt 145.0 lb

## 2019-11-08 DIAGNOSIS — S42295D Other nondisplaced fracture of upper end of left humerus, subsequent encounter for fracture with routine healing: Secondary | ICD-10-CM

## 2019-11-08 DIAGNOSIS — S42292D Other displaced fracture of upper end of left humerus, subsequent encounter for fracture with routine healing: Secondary | ICD-10-CM | POA: Diagnosis not present

## 2019-11-08 DIAGNOSIS — S42291D Other displaced fracture of upper end of right humerus, subsequent encounter for fracture with routine healing: Secondary | ICD-10-CM

## 2019-11-08 MED ORDER — OXYCODONE-ACETAMINOPHEN 5-325 MG PO TABS: 1.0000 | ORAL_TABLET | Freq: Four times a day (QID) | ORAL | 0 refills | Status: AC | PRN

## 2019-11-08 NOTE — Progress Notes (Signed)
Casey [name] is 58 years old she is here for fracture care follow-up she has a proximal humerus fracture on initial films of left the fracture was in good position on today's film which is a better AP x-ray it looks like the ice cream is fallen off the cone  She says she is in severe pain on hydrocodone 5 mg in the pain is 6-10 out of 10 and she says it is worse than any other fracture that she is ever had  She is a smoker.  She has high anxiety.  I thought her to be a poor surgical candidate on initial presentation.  She does remain neurovascularly intact she says she feels a knot in the right arm which is probably the humeral head  Recommend CAT scan to evaluate the shoulder  She will follow up after that and I increased her pain medicine to Percocet 5 mg every 6 hours #30  Meds ordered this encounter  Medications  . oxyCODONE-acetaminophen (PERCOCET/ROXICET) 5-325 MG tablet    Sig: Take 1 tablet by mouth every 6 (six) hours as needed for up to 7 days for severe pain. Stop hydrocodone not effective for fracture    Dispense:  30 tablet    Refill:  0    Encounter Diagnoses  Name Primary?  . Other closed nondisplaced fracture of proximal end of left humerus with routine healing, subsequent encounter12/08/2019 Yes  . Other closed displaced fracture of proximal end of right humerus with routine healing, subsequent encounter

## 2019-11-08 NOTE — Patient Instructions (Signed)
Your x-ray looks like the fracture is not in good position.  You will be sent for CAT scan  Your pain level is high on hydrocodone so we are switching you back to Percocet  He will come back to the office after the CAT scan you may need surgery and we will send you to a fracture specialist if you need surgery

## 2019-11-13 ENCOUNTER — Telehealth: Payer: Self-pay | Admitting: Orthopedic Surgery

## 2019-11-13 ENCOUNTER — Telehealth: Payer: Self-pay | Admitting: Radiology

## 2019-11-13 NOTE — Telephone Encounter (Signed)
I tried Friday and today to contact patient regarding  CT scan It is scheduled for Casey [name] Wed 11/15/2019 at 6 pm, arrive 30 minutes early.   Unable to reach her  Also her [employer] termed, need to know if she is self pay or if she has no coverage.

## 2019-11-13 NOTE — Telephone Encounter (Signed)
Called patient in response to her call earlier today regarding faxed forms from her short-term disability insurer. Left message to return call regarding forms process.

## 2019-11-14 NOTE — Telephone Encounter (Signed)
Toniann Fail spoke to patient about this.

## 2019-11-15 ENCOUNTER — Other Ambulatory Visit: Payer: Self-pay

## 2019-11-15 ENCOUNTER — Ambulatory Visit (HOSPITAL_COMMUNITY)
Admission: RE | Admit: 2019-11-15 | Discharge: 2019-11-15 | Disposition: A | Discharge status: Home / Self Care | Payer: BC Managed Care – PPO | Source: Ambulatory Visit | Attending: Orthopedic Surgery | Admitting: Orthopedic Surgery

## 2019-11-15 DIAGNOSIS — S42295D Other nondisplaced fracture of upper end of left humerus, subsequent encounter for fracture with routine healing: Secondary | ICD-10-CM | POA: Diagnosis not present

## 2019-11-15 DIAGNOSIS — S42292A Other displaced fracture of upper end of left humerus, initial encounter for closed fracture: Secondary | ICD-10-CM | POA: Diagnosis not present

## 2019-11-15 NOTE — Telephone Encounter (Signed)
Called back to patient; reached - aware.

## 2019-11-16 ENCOUNTER — Other Ambulatory Visit: Payer: Self-pay | Admitting: Orthopedic Surgery

## 2019-11-16 NOTE — Telephone Encounter (Signed)
Patient called for refill of pain medication:  HYDROcodone-acetaminophen (NORCO/VICODIN) 5-325 MG tablet  [facility] [place]  -patient aware she is to be called with results of her CT scan, done yesterday, 11/15/19

## 2019-11-17 MED ORDER — HYDROCODONE-ACETAMINOPHEN 5-325 MG PO TABS: 1.0000 | ORAL_TABLET | ORAL | 0 refills | Status: DC | PRN

## 2019-11-20 ENCOUNTER — Telehealth: Payer: Self-pay

## 2019-11-20 NOTE — Telephone Encounter (Signed)
Patient called wanting to hear from Dr. Romeo Apple regarding her CT Results. States she is in a lot of pain and is anxious to find out what the problem is with her Left shoulder.  Please call and advise

## 2019-11-20 NOTE — Telephone Encounter (Signed)
IMPRESSION: 1. Comminuted impacted fracture of the left humeral head and neck as described. The remnants of the humeral head are inferiorly subluxed with respect to the glenoid. 2. The overriding proximal humeral shaft protrudes into the deltoid muscle.

## 2019-11-21 NOTE — Telephone Encounter (Signed)
Patient said she is awaiting call with her CT results; her next appointment is pending. Please advise. Ph# [phone]

## 2019-11-23 ENCOUNTER — Telehealth: Payer: Self-pay | Admitting: Orthopedic Surgery

## 2019-11-23 ENCOUNTER — Other Ambulatory Visit: Payer: Self-pay | Admitting: Orthopedic Surgery

## 2019-11-23 DIAGNOSIS — S42291D Other displaced fracture of upper end of right humerus, subsequent encounter for fracture with routine healing: Secondary | ICD-10-CM

## 2019-11-23 MED ORDER — HYDROCODONE-ACETAMINOPHEN 5-325 MG PO TABS: 1.0000 | ORAL_TABLET | ORAL | 0 refills | Status: DC | PRN

## 2019-11-23 NOTE — Telephone Encounter (Signed)
Yes, make appointment (DR Romeo Apple  has plans to call patients today) so if we need to schedule, then cancel that is okay,   To Dr Romeo Apple for meds

## 2019-11-23 NOTE — Telephone Encounter (Signed)
Patient called to request refill on medication: HYDROcodone-acetaminophen (NORCO/VICODIN) 5-325 MG tablet [facility] [place] [facility] - relays she has not heard back about results of CT and if another appointment is needed.

## 2019-11-23 NOTE — Telephone Encounter (Signed)
Message left

## 2019-11-23 NOTE — Telephone Encounter (Signed)
Robynne called back stating that she missed a phone call from Dr. Romeo Apple.  She said he left a message stating that she would need to be referred to another physician.  She said he did not say who she was being referred to  Can you get with him on this referral?  Amy will not be in the office tomorrow and so I didn't know who else to ask.  Thanks so much

## 2019-11-24 ENCOUNTER — Telehealth: Payer: Self-pay | Admitting: Orthopedic Surgery

## 2019-11-24 NOTE — Telephone Encounter (Signed)
Dr Romeo Apple,  Could you please advise Amy on where to send the referral for this patient?  Thanks

## 2019-11-24 NOTE — Telephone Encounter (Signed)
CT results // referring to Dr August Saucer

## 2019-11-24 NOTE — Telephone Encounter (Signed)
Will you pls follow up on this one? Thanks-

## 2019-11-27 NOTE — Telephone Encounter (Signed)
Dr Romeo Apple states in his phone note refer to Dr August Saucer  Have put in referral

## 2019-11-28 ENCOUNTER — Telehealth: Payer: Self-pay | Admitting: Orthopedic Surgery

## 2019-11-28 ENCOUNTER — Other Ambulatory Visit: Payer: Self-pay | Admitting: Orthopedic Surgery

## 2019-11-28 DIAGNOSIS — S42291D Other displaced fracture of upper end of right humerus, subsequent encounter for fracture with routine healing: Secondary | ICD-10-CM

## 2019-11-28 MED ORDER — OXYCODONE-ACETAMINOPHEN 5-325 MG PO TABS: 1.0000 | ORAL_TABLET | Freq: Four times a day (QID) | ORAL | 0 refills | Status: AC | PRN

## 2019-11-28 NOTE — Progress Notes (Signed)
Med change norco not working

## 2019-11-28 NOTE — Telephone Encounter (Signed)
Patient said Dr Romeo Apple called her on Friday, 11/24/19 and received her results. Aware of referral to Dr August Saucer.

## 2019-11-28 NOTE — Telephone Encounter (Signed)
Patient states at time of receiving her results of scan, that Dr Romeo Apple would be prescribing another pain medication; states she told Dr Romeo Apple that the Hydrocodone was not working.  [facility] [place]

## 2019-11-30 ENCOUNTER — Ambulatory Visit (INDEPENDENT_AMBULATORY_CARE_PROVIDER_SITE_OTHER): Payer: BC Managed Care – PPO | Admitting: Orthopedic Surgery

## 2019-11-30 ENCOUNTER — Other Ambulatory Visit: Payer: Self-pay

## 2019-11-30 ENCOUNTER — Encounter: Payer: Self-pay | Admitting: Orthopedic Surgery

## 2019-11-30 ENCOUNTER — Ambulatory Visit
Admission: RE | Admit: 2019-11-30 | Discharge: 2019-11-30 | Disposition: A | Discharge status: Home / Self Care | Payer: BC Managed Care – PPO | Source: Ambulatory Visit | Attending: Orthopedic Surgery | Admitting: Orthopedic Surgery

## 2019-11-30 DIAGNOSIS — S42295D Other nondisplaced fracture of upper end of left humerus, subsequent encounter for fracture with routine healing: Secondary | ICD-10-CM

## 2019-11-30 DIAGNOSIS — S42292A Other displaced fracture of upper end of left humerus, initial encounter for closed fracture: Secondary | ICD-10-CM | POA: Diagnosis not present

## 2019-11-30 DIAGNOSIS — S42352A Displaced comminuted fracture of shaft of humerus, left arm, initial encounter for closed fracture: Secondary | ICD-10-CM | POA: Diagnosis not present

## 2019-11-30 MED ORDER — OXYCODONE HCL 5 MG PO CAPS: 5.0000 mg | ORAL_CAPSULE | Freq: Four times a day (QID) | ORAL | 0 refills | Status: DC | PRN

## 2019-11-30 NOTE — Progress Notes (Signed)
Post-Op Visit Note   Patient: Casey [name]           Date of Birth: Mar 30, 1961           MRN: [id number] Visit Date: 11/30/2019 PCP: Kari Baars, MD   Assessment & Plan:  Chief Complaint:  Chief Complaint  Patient presents with  . Left Shoulder - Fracture   Visit Diagnoses:  1. Other closed nondisplaced fracture of proximal end of left humerus with routine healing, subsequent encounter     Plan: Donnajean is a patient sustained left proximal humerus fracture 10/19/2019.  She had a fall from a porch.  She has been wearing a sling.  Reports significant pain and inability to lift her arm.  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  Does not lift much more than 15 pounds.  She is a smoker.  Taking oxycodone 5 mg every 6 hours without relief.  On exam her deltoid does fire but she has deformity and very limited forward flexion abduction to about 10 degrees each.  The fracture has healed.  Motor sensory function to the hand is intact.  Radial pulses intact.  Impression is malunited left proximal humerus fracture with no real other surgical options other than reverse shoulder replacement.  The risk benefits are discussed include not limited to infection nerve vessel damage dislocation as well as potential need for revision in her lifetime.  Patient understands the risk and benefits and wishes to proceed.  Plan for thin cut CT scan today for preoperative patient specific instrumentation and planning.  I think that can be important for Liviya in order to pain maximum longevity of the implant.  Patient understands risk benefits.  Anticipate surgery within 2 to 3 weeks pending block construction.  All questions answered  Follow-Up Instructions: No follow-ups on file.   Orders:  Orders Placed This Encounter  Procedures  . CT SHOULDER LEFT WO CONTRAST   No orders of the defined types were placed in this encounter.   Imaging: No results found.  PMFS  History: Patient Active Problem List   Diagnosis Date Noted  . Fracture of base of fifth metacarpal bone of right hand 10/06/2016  . Benzodiazepine dependence, continuous (HCC) 01/09/2016  . History of panic attacks 09/11/2014  . Malaise 09/03/2014  . Fatigue 09/03/2014  . Body mass index (BMI) of 20.0-20.9 in adult 09/03/2014  . Right clavicle fracture 12/12/2013  . Clavicle fracture 11/07/2013   Past Medical History:  Diagnosis Date  . Anxiety   . Chronic back pain   . Depression   . History of panic attacks 09/11/2014  . Medical history non-contributory   . Ruptured disc, thoracic   . Suicide attempt (HCC) 2002    Family History  Problem Relation Age of Onset  . Diabetes Mother   . Pulmonary fibrosis Mother   . Heart attack Father   . Parkinson's disease Sister   . Bipolar disorder Sister   . Thyroid disease Sister   . Heart attack Brother   . Other Son        vascular neucrosis  . Pulmonary fibrosis Maternal Grandmother   . Diabetes Maternal Grandfather   . Stroke Maternal Grandfather   . Pneumonia Paternal Grandfather   . Cancer Sister        melonoma  . Other Brother        back problems, nerve problems, soft bones    Past Surgical History:  Procedure Laterality  Date  . CESAREAN SECTION    . CLAVICLE SURGERY    . TUBAL LIGATION     Social History   Occupational History  . Not on file  Tobacco Use  . Smoking status: Current Every Day Smoker    Packs/day: 0.50    Types: Cigarettes  . Smokeless tobacco: Never Used  . Tobacco comment: 2-3 cigarettes per day  Substance and Sexual Activity  . Alcohol use: Not Currently    Alcohol/week: 1.0 standard drinks    Types: 1 Cans of beer per week    Comment: occ   . Drug use: Never    Types: "Crack" cocaine, Benzodiazepines  . Sexual activity: Not Currently    Birth control/protection: Post-menopausal

## 2019-12-01 ENCOUNTER — Other Ambulatory Visit: Payer: Self-pay

## 2019-12-04 NOTE — Telephone Encounter (Signed)
Patient returned call; voices understanding. In addition, we discussed forms process; aware that she will request her [employer] short-term disability form, which she initiated here, through [facility] office since she is now under Dr Diamantina Providence care.

## 2019-12-04 NOTE — Telephone Encounter (Signed)
Patient called back to relay (voice message received from family member on her behalf, daughter-in-law, relays patient has seen shoulder specialist Dr August Saucer, and that he did not prescribe anything. Asking if Dr Romeo Apple can prescribe while she is awaiting to see Dr August Saucer again.  Called back to patient's phone # [phone], reached voice mail, left message to return call. (will relay if patient calls back that she is not currently under Dr Harrison's care; therefore, no medication would be prescribed.)

## 2019-12-11 ENCOUNTER — Other Ambulatory Visit: Payer: Self-pay | Admitting: Surgical

## 2019-12-11 ENCOUNTER — Telehealth: Payer: Self-pay | Admitting: Orthopedic Surgery

## 2019-12-11 MED ORDER — OXYCODONE HCL 5 MG PO CAPS: 5.0000 mg | ORAL_CAPSULE | Freq: Three times a day (TID) | ORAL | 0 refills | Status: DC | PRN

## 2019-12-11 NOTE — Telephone Encounter (Signed)
Patient was called and provided instructions for Benzoyl Peroxide. She will pick up this week when she drops off FMLA papers.  Patient  Is asking if there is anyway to move surgery to an earlier date.  I did explain we are waiting on her shoulder implant and for that reason we are unable to rush the surgery date. She states she is in a lot of pain, has anxiety about not being able to work right and now and is unable to sleep at night because of the pain and inability to get comfortable.  Patient is asking if there is anything you can call in at [facility] for the pain, or even something  to help her sleep.     Pt's cb  [phone]

## 2019-12-11 NOTE — Telephone Encounter (Signed)
Pls advise. thx 

## 2019-12-18 ENCOUNTER — Telehealth: Payer: Self-pay | Admitting: Orthopedic Surgery

## 2019-12-18 NOTE — Telephone Encounter (Signed)
Pt called in requesting we let Darcey Nora with [employer] know the patient cannot return to work on 12-26-19 because she still can't lift her arm and hasn't had her surgery 01-01-20 yet.

## 2019-12-19 NOTE — Telephone Encounter (Signed)
Please advise approximately how long patient will be out of work after surgery that is scheduled for 01/01/20.

## 2019-12-19 NOTE — Telephone Encounter (Signed)
This patient was referred to Dr August Saucer by Dr Romeo Apple.  She needed an updated work note. She does not have access to [employer]. She will come to the [place] office to pick up a copy since she is unable to make it to [place].

## 2019-12-19 NOTE — Telephone Encounter (Signed)
Printed, and I put up front for her to pick up.  Thanks.

## 2019-12-19 NOTE — Telephone Encounter (Signed)
3 mos

## 2019-12-21 ENCOUNTER — Other Ambulatory Visit: Payer: Self-pay | Admitting: Surgical

## 2019-12-21 ENCOUNTER — Telehealth: Payer: Self-pay | Admitting: Orthopedic Surgery

## 2019-12-21 MED ORDER — OXYCODONE HCL 5 MG PO CAPS: 5.0000 mg | ORAL_CAPSULE | Freq: Three times a day (TID) | ORAL | 0 refills | Status: DC | PRN

## 2019-12-21 NOTE — Telephone Encounter (Signed)
Please advise. Thanks.  

## 2019-12-21 NOTE — Telephone Encounter (Signed)
Patient called. She would like a refill on her pain medication. Her call back number is [phone]

## 2019-12-21 NOTE — Telephone Encounter (Signed)
Submitted refill but must last until surgery.

## 2019-12-22 NOTE — Telephone Encounter (Signed)
IC LMVM advising.  

## 2019-12-28 ENCOUNTER — Inpatient Hospital Stay (HOSPITAL_COMMUNITY): Admission: RE | Admit: 2019-12-28 | Payer: BC Managed Care – PPO | Source: Ambulatory Visit

## 2019-12-28 ENCOUNTER — Other Ambulatory Visit (HOSPITAL_COMMUNITY): Payer: BC Managed Care – PPO

## 2019-12-28 NOTE — Progress Notes (Signed)
[facility] [address] - [place], [place] - [address] [place] [address] Phone: [phone] Fax: [phone]  [facility] [phone] - [place], [place] - [address] [place] [address] Phone: [phone] Fax: [phone]      Your procedure is scheduled on Monday, 01/01/2020.  Report to [facility] Main Entrance "A" at 05:30 A.M., and check in at the Admitting office.  Call this number if you have problems the morning of surgery:  [phone]  Call [phone] if you have any questions prior to your surgery date Monday-Friday 8am-4pm    Remember:  Do not eat after midnight the night before your surgery  You may drink clear liquids until 04:30am the morning of your surgery.   Clear liquids allowed are: Water, Non-Citrus Juices (without pulp), Carbonated Beverages, Clear Tea, Black Coffee Only, and Gatorade   Enhanced Recovery after Surgery for Orthopedics Enhanced Recovery after Surgery is a protocol used to improve the stress on your body and your recovery after surgery.  Patient Instructions  . The night before surgery:  o No food after midnight. ONLY clear liquids after midnight  .  Marland Kitchen The day of surgery (if you do NOT have diabetes):  o Drink ONE (1) Pre-Surgery Clear Ensure as directed.   o This drink was given to you during your hospital  pre-op appointment visit. o The pre-op nurse will instruct you on the time to drink the  Pre-Surgery Ensure depending on your surgery time. o Finish the drink at the designated time by the pre-op nurse. - Complete the drink by 04:30  o Nothing else to drink after completing the  Pre-Surgery Clear Ensure.          If you have questions, please contact your surgeon's office.     Take these medicines the morning of surgery with A SIP OF: Citalopram (celexa) Oxycodone (Oxy-IR) - if needed  7 days prior to surgery STOP taking any Aspirin (unless  otherwise instructed by your surgeon), Aleve, Naproxen, Ibuprofen, Motrin, Advil, Goody's, BC's, all herbal medications, fish oil, and all vitamins.    The Morning of Surgery  Do not wear jewelry, make-up or nail polish.  Do not wear lotions, powders, perfumes, or deodorant  Do not shave 48 hours prior to surgery.   Do not bring valuables to the hospital.  [facility] is not responsible for any belongings or valuables.  If you are a smoker, DO NOT Smoke 24 hours prior to surgery  If you wear a CPAP at night please bring your mask the morning of surgery   Remember that you must have someone to transport you home after your surgery, and remain with you for 24 hours if you are discharged the same day.   Please bring cases for contacts, glasses, hearing aids, dentures or bridgework because it cannot be worn into surgery.    Leave your suitcase in the car.  After surgery it may be brought to your room.  For patients admitted to the hospital, discharge time will be determined by your treatment team.  Patients discharged the day of surgery will not be allowed to drive home.    Special instructions:                                    [facility]- Preparing for Total Shoulder Arthroplasty  Before surgery, you can play an important role. Because skin is not sterile, your skin needs to be as free of germs as possible. You can reduce the number of germs on your skin by using the following products. . Benzoyl Peroxide Gel o Reduces the number of germs present on the skin o Applied twice a day to shoulder area starting two days before surgery   . Chlorhexidine Gluconate (CHG) Soap o An antiseptic cleaner that kills germs and bonds with the skin to continue killing germs even after washing o Used for showering the night before surgery and morning of surgery   Oral Hygiene is also important to reduce your risk of infection.                                    Remember - BRUSH YOUR TEETH THE  MORNING OF SURGERY WITH YOUR REGULAR TOOTHPASTE  ==================================================================  Please follow these instructions carefully:  BENZOYL PEROXIDE 5% GEL  Please do not use if you have an allergy to benzoyl peroxide.   If your skin becomes reddened/irritated stop using the benzoyl peroxide.  Starting two days before surgery, apply as follows: 1. Apply benzoyl peroxide in the morning and at night. Apply after taking a shower. If you are not taking a shower clean entire shoulder front, back, and side along with the armpit with a clean wet washcloth.  2. Place a quarter-sized dollop on your shoulder and rub in thoroughly, making sure to cover the front, back, and side of your shoulder, along with the armpit.   2 days before ____ AM   ____ PM              1 day before ____ AM   ____ PM                          3.  Do this twice a day for two days.  (Last application is the night before surgery, AFTER using the CHG soap as described below).  4. Do NOT apply benzoyl peroxide gel on the day of surgery.  CHLORHEXIDINE GLUCONATE (CHG) SOAP   Please do not use if you have an allergy to CHG or antibacterial soaps. If your skin becomes reddened/irritated stop using the CHG.   Do not shave (including legs and underarms) for at least 48 hours prior to first CHG shower. It is OK to shave your face.  Starting the night before surgery, use CHG soap as follows:  1. Shower the NIGHT BEFORE SURGERY and MORNING OF SURGERY with CHG.  2. If you choose to wash your hair, wash your hair first as usual with your normal shampoo.  3. After shampooing, rinse your hair and body thoroughly to remove the shampoo.  4. Use CHG as you would any other liquid soap.  You can apply CHG directly to the skin and wash gently with a scrungie or a clean washcloth.  5. Apply the CHG soap to your body ONLY FROM THE NECK DOWN.  Do not use on open wounds or open sores.  Avoid contact with your  eyes, ears, mouth, and genitals (private parts).  Wash face and genitals (private parts) with your normal soap.  6. Wash thoroughly, paying special attention to the area where your surgery will be performed.  7. Thoroughly rinse your body with warm water from the neck down.  8. DO  NOT shower/wash with your normal soap after using and rinsing off the CHG soap.   9. Pat yourself dry with a CLEAN TOWEL.   10.  Apply benzoyl peroxide.   11. Wear CLEAN PAJAMAS to bed the night before surgery; wear comfortable clothes the morning of surgery.  12. Place CLEAN SHEETS on your bed the night of your first shower and DO NOT SLEEP WITH PETS.  Day of Surgery: Shower as stated above Do not apply any deodorants/lotions.  Please wear clean clothes to the hospital/surgery center.   Remember to brush your teeth WITH YOUR REGULAR TOOTHPASTE.     Please read over the following fact sheets that you were given.

## 2019-12-29 ENCOUNTER — Telehealth: Payer: Self-pay | Admitting: *Deleted

## 2019-12-29 ENCOUNTER — Telehealth: Payer: Self-pay | Admitting: Orthopedic Surgery

## 2019-12-29 ENCOUNTER — Other Ambulatory Visit: Payer: Self-pay | Admitting: Surgical

## 2019-12-29 ENCOUNTER — Encounter (HOSPITAL_COMMUNITY)
Admission: RE | Admit: 2019-12-29 | Discharge: 2019-12-29 | Disposition: A | Discharge status: Home / Self Care | Payer: BC Managed Care – PPO | Source: Ambulatory Visit | Attending: Orthopedic Surgery | Admitting: Orthopedic Surgery

## 2019-12-29 ENCOUNTER — Other Ambulatory Visit: Payer: Self-pay

## 2019-12-29 ENCOUNTER — Encounter (HOSPITAL_COMMUNITY): Payer: Self-pay

## 2019-12-29 ENCOUNTER — Other Ambulatory Visit (HOSPITAL_COMMUNITY)
Admission: RE | Admit: 2019-12-29 | Discharge: 2019-12-29 | Disposition: A | Discharge status: Home / Self Care | Payer: BC Managed Care – PPO | Source: Ambulatory Visit | Attending: Orthopedic Surgery | Admitting: Orthopedic Surgery

## 2019-12-29 DIAGNOSIS — Z01812 Encounter for preprocedural laboratory examination: Secondary | ICD-10-CM | POA: Insufficient documentation

## 2019-12-29 DIAGNOSIS — Z20822 Contact with and (suspected) exposure to covid-19: Secondary | ICD-10-CM | POA: Diagnosis not present

## 2019-12-29 LAB — URINALYSIS, ROUTINE W REFLEX MICROSCOPIC
Bilirubin Urine: NEGATIVE
Glucose, UA: NEGATIVE mg/dL
Hgb urine dipstick: NEGATIVE
Ketones, ur: NEGATIVE mg/dL
Nitrite: NEGATIVE
Protein, ur: NEGATIVE mg/dL
Specific Gravity, Urine: 1.008 (ref 1.005–1.030)
pH: 7 (ref 5.0–8.0)

## 2019-12-29 LAB — SURGICAL PCR SCREEN
MRSA, PCR: NEGATIVE
Staphylococcus aureus: NEGATIVE

## 2019-12-29 LAB — SARS CORONAVIRUS 2 (TAT 6-24 HRS): SARS Coronavirus 2: NEGATIVE

## 2019-12-29 MED ORDER — OXYCODONE HCL 5 MG PO CAPS: 5.0000 mg | ORAL_CAPSULE | Freq: Three times a day (TID) | ORAL | 0 refills | Status: DC | PRN

## 2019-12-29 NOTE — Progress Notes (Signed)
PCP:  Kari Baars, MD Cardiologist:  Denies  EKG:  N/A CXR:  N/A ECHO:  Denies Stress Test:  Denies Cardiac Cath:  Denies  Covid test 12/29/19  Patient denies shortness of breath, fever, cough, and chest pain at PAT appointment.  Patient verbalized understanding of instructions provided today at the PAT appointment.  Patient asked to review instructions at home and day of surgery.

## 2019-12-29 NOTE — Telephone Encounter (Signed)
RNCM asked to help with CM needs prior to surgery as patient is scheduled for same day discharge for a Left reversal with shoulder arthroplasty on Monday, 01/01/20 per Dr. August Saucer. Spoke with patient this Wednesday, 12/27/19. Reviewed all post-op instructions and she verbalized understanding. HHPT has been ordered. Referral made to [facility]. Choice provided. Instructed to contact [employer] rep as they have been attempting to reach her regarding her CPM prior to surgery. Instructed patient to call immediately and she verbalized she would. No other questions at this time. CM will be available to assist after surgery if needed.

## 2019-12-29 NOTE — Telephone Encounter (Signed)
Pls advise.  

## 2019-12-29 NOTE — Telephone Encounter (Signed)
Patient called needing Rx refilled (Oxycodone) The number to contact patient is [phone]

## 2019-12-30 ENCOUNTER — Other Ambulatory Visit (HOSPITAL_COMMUNITY): Payer: BC Managed Care – PPO

## 2019-12-30 LAB — URINE CULTURE: Culture: <10000 — AB

## 2019-12-31 ENCOUNTER — Other Ambulatory Visit: Payer: Self-pay | Admitting: Surgical

## 2019-12-31 MED ORDER — CELECOXIB 200 MG PO CAPS: 200.0000 mg | ORAL_CAPSULE | Freq: Two times a day (BID) | ORAL | 0 refills | Status: DC

## 2019-12-31 NOTE — Anesthesia Preprocedure Evaluation (Addendum)
Anesthesia Evaluation  Patient identified by MRN, date of birth, ID band Patient awake    Reviewed: Allergy & Precautions, NPO status , Patient's Chart, lab work & pertinent test results  History of Anesthesia Complications Negative for: history of anesthetic complications  Airway Mallampati: II  TM Distance: >3 FB Neck ROM: Full    Dental  (+) Teeth Intact, Dental Advisory Given, Caps,    Pulmonary Current SmokerPatient did not abstain from smoking.,    Pulmonary exam normal breath sounds clear to auscultation       Cardiovascular negative cardio ROS Normal cardiovascular exam Rhythm:Regular Rate:Normal     Neuro/Psych PSYCHIATRIC DISORDERS Anxiety Depression negative neurological ROS     GI/Hepatic negative GI ROS, Neg liver ROS,   Endo/Other  negative endocrine ROS  Renal/GU negative Renal ROS     Musculoskeletal left proximal humerus fracture   Abdominal   Peds  Hematology negative hematology ROS (+)   Anesthesia Other Findings Day of surgery medications reviewed with the patient.  Reproductive/Obstetrics                           Anesthesia Physical Anesthesia Plan  ASA: II  Anesthesia Plan: General   Post-op Pain Management:  Regional for Post-op pain   Induction: Intravenous  PONV Risk Score and Plan: 2 and Midazolam, Dexamethasone and Ondansetron  Airway Management Planned: Oral ETT  Additional Equipment:   Intra-op Plan:   Post-operative Plan: Extubation in OR  Informed Consent: I have reviewed the patients History and Physical, chart, labs and discussed the procedure including the risks, benefits and alternatives for the proposed anesthesia with the patient or authorized representative who has indicated his/her understanding and acceptance.     Dental advisory given  Plan Discussed with: CRNA  Anesthesia Plan Comments:        Anesthesia Quick  Evaluation

## 2020-01-01 ENCOUNTER — Ambulatory Visit (HOSPITAL_COMMUNITY): Payer: BC Managed Care – PPO | Admitting: Anesthesiology

## 2020-01-01 ENCOUNTER — Observation Stay (HOSPITAL_COMMUNITY): Payer: BC Managed Care – PPO

## 2020-01-01 ENCOUNTER — Observation Stay (HOSPITAL_COMMUNITY)
Admission: RE | Admit: 2020-01-01 | Discharge: 2020-01-02 | Disposition: A | Discharge status: Home / Self Care | Payer: BC Managed Care – PPO | Attending: Orthopedic Surgery | Admitting: Orthopedic Surgery

## 2020-01-01 ENCOUNTER — Encounter (HOSPITAL_COMMUNITY)
Admission: RE | Disposition: A | Discharge status: Home / Self Care | Payer: Self-pay | Source: Home / Self Care | Attending: Orthopedic Surgery

## 2020-01-01 ENCOUNTER — Other Ambulatory Visit: Payer: Self-pay

## 2020-01-01 ENCOUNTER — Encounter (HOSPITAL_COMMUNITY): Payer: Self-pay | Admitting: Orthopedic Surgery

## 2020-01-01 DIAGNOSIS — X58XXXD Exposure to other specified factors, subsequent encounter: Secondary | ICD-10-CM | POA: Diagnosis not present

## 2020-01-01 DIAGNOSIS — S42292S Other displaced fracture of upper end of left humerus, sequela: Secondary | ICD-10-CM | POA: Diagnosis not present

## 2020-01-01 DIAGNOSIS — F329 Major depressive disorder, single episode, unspecified: Secondary | ICD-10-CM | POA: Insufficient documentation

## 2020-01-01 DIAGNOSIS — Z471 Aftercare following joint replacement surgery: Secondary | ICD-10-CM | POA: Diagnosis not present

## 2020-01-01 DIAGNOSIS — F17210 Nicotine dependence, cigarettes, uncomplicated: Secondary | ICD-10-CM | POA: Insufficient documentation

## 2020-01-01 DIAGNOSIS — S42202A Unspecified fracture of upper end of left humerus, initial encounter for closed fracture: Secondary | ICD-10-CM | POA: Diagnosis present

## 2020-01-01 DIAGNOSIS — S62316A Displaced fracture of base of fifth metacarpal bone, right hand, initial encounter for closed fracture: Secondary | ICD-10-CM | POA: Diagnosis not present

## 2020-01-01 DIAGNOSIS — F419 Anxiety disorder, unspecified: Secondary | ICD-10-CM | POA: Diagnosis not present

## 2020-01-01 DIAGNOSIS — S42202K Unspecified fracture of upper end of left humerus, subsequent encounter for fracture with nonunion: Secondary | ICD-10-CM | POA: Diagnosis not present

## 2020-01-01 DIAGNOSIS — Z96612 Presence of left artificial shoulder joint: Secondary | ICD-10-CM | POA: Diagnosis not present

## 2020-01-01 DIAGNOSIS — G8918 Other acute postprocedural pain: Secondary | ICD-10-CM | POA: Diagnosis not present

## 2020-01-01 DIAGNOSIS — Z9889 Other specified postprocedural states: Secondary | ICD-10-CM

## 2020-01-01 HISTORY — PX: REVERSE SHOULDER ARTHROPLASTY: SHX5054

## 2020-01-01 LAB — BASIC METABOLIC PANEL
Anion gap: 12 (ref 5–15)
BUN: 6 mg/dL (ref 6–20)
CO2: 20 mmol/L — ABNORMAL LOW (ref 22–32)
Calcium: 8.7 mg/dL — ABNORMAL LOW (ref 8.9–10.3)
Chloride: 105 mmol/L (ref 98–111)
Creatinine, Ser: 0.57 mg/dL (ref 0.44–1.00)
GFR calc Af Amer: >60 mL/min (ref >60)
GFR calc non Af Amer: >60 mL/min (ref >60)
Glucose, Bld: 107 mg/dL — ABNORMAL HIGH (ref 70–99)
Potassium: 3.9 mmol/L (ref 3.5–5.1)
Sodium: 137 mmol/L (ref 135–145)

## 2020-01-01 LAB — CBC
HCT: 40.7 % (ref 36.0–46.0)
Hemoglobin: 13.2 g/dL (ref 12.0–15.0)
MCH: 32.7 pg (ref 26.0–34.0)
MCHC: 32.4 g/dL (ref 30.0–36.0)
MCV: 100.7 fL — ABNORMAL HIGH (ref 80.0–100.0)
Platelets: 419 10*3/uL — ABNORMAL HIGH (ref 150–400)
RBC: 4.04 MIL/uL (ref 3.87–5.11)
RDW: 13.8 % (ref 11.5–15.5)
WBC: 7.4 10*3/uL (ref 4.0–10.5)
nRBC: 0 % (ref 0.0–0.2)

## 2020-01-01 SURGERY — ARTHROPLASTY, SHOULDER, TOTAL, REVERSE
Anesthesia: General | Site: Shoulder | Laterality: Left

## 2020-01-01 MED ORDER — DEXAMETHASONE SODIUM PHOSPHATE 10 MG/ML IJ SOLN
INTRAMUSCULAR | Status: DC | PRN
  Administered 2020-01-01: 10 mg via INTRAVENOUS

## 2020-01-01 MED ORDER — GABAPENTIN 300 MG PO CAPS
300.0000 mg | ORAL_CAPSULE | Freq: Three times a day (TID) | ORAL | Status: DC
  Administered 2020-01-01 – 2020-01-02 (×3): 300 mg via ORAL
  Filled 2020-01-01 (×4): qty 1

## 2020-01-01 MED ORDER — LACTATED RINGERS IV SOLN: INTRAVENOUS | Status: DC

## 2020-01-01 MED ORDER — TRAMADOL HCL 50 MG PO TABS
50.0000 mg | ORAL_TABLET | Freq: Four times a day (QID) | ORAL | Status: DC
  Administered 2020-01-01 – 2020-01-02 (×3): 50 mg via ORAL
  Filled 2020-01-01 (×3): qty 1

## 2020-01-01 MED ORDER — FENTANYL CITRATE (PF) 100 MCG/2ML IJ SOLN: 25.0000 ug | INTRAMUSCULAR | Status: DC | PRN

## 2020-01-01 MED ORDER — CHLORHEXIDINE GLUCONATE 4 % EX LIQD: 60.0000 mL | Freq: Once | CUTANEOUS | Status: DC

## 2020-01-01 MED ORDER — METHOCARBAMOL 500 MG PO TABS
500.0000 mg | ORAL_TABLET | Freq: Four times a day (QID) | ORAL | Status: DC | PRN
  Administered 2020-01-01 – 2020-01-02 (×2): 500 mg via ORAL
  Filled 2020-01-01 (×2): qty 1

## 2020-01-01 MED ORDER — ASPIRIN 81 MG PO CHEW
81.0000 mg | CHEWABLE_TABLET | Freq: Every day | ORAL | Status: DC
  Administered 2020-01-01 – 2020-01-02 (×2): 81 mg via ORAL
  Filled 2020-01-01 (×2): qty 1

## 2020-01-01 MED ORDER — PROPOFOL 10 MG/ML IV BOLUS
INTRAVENOUS | Status: AC
  Filled 2020-01-01: qty 20

## 2020-01-01 MED ORDER — METOCLOPRAMIDE HCL 5 MG/ML IJ SOLN: 5.0000 mg | Freq: Three times a day (TID) | INTRAMUSCULAR | Status: DC | PRN

## 2020-01-01 MED ORDER — BUPIVACAINE LIPOSOME 1.3 % IJ SUSP
INTRAMUSCULAR | Status: DC | PRN
  Administered 2020-01-01: 10 mL via PERINEURAL

## 2020-01-01 MED ORDER — IRRISEPT - 450ML BOTTLE WITH 0.05% CHG IN STERILE WATER, USP 99.95% OPTIME
TOPICAL | Status: DC | PRN
  Administered 2020-01-01: 09:00:00 450 mL via TOPICAL

## 2020-01-01 MED ORDER — CELECOXIB 200 MG PO CAPS
200.0000 mg | ORAL_CAPSULE | Freq: Two times a day (BID) | ORAL | Status: DC
  Administered 2020-01-01 – 2020-01-02 (×3): 200 mg via ORAL
  Filled 2020-01-01 (×4): qty 1

## 2020-01-01 MED ORDER — METOCLOPRAMIDE HCL 5 MG PO TABS: 5.0000 mg | ORAL_TABLET | Freq: Three times a day (TID) | ORAL | Status: DC | PRN

## 2020-01-01 MED ORDER — VANCOMYCIN HCL 1000 MG IV SOLR
INTRAVENOUS | Status: AC
  Filled 2020-01-01: qty 1000

## 2020-01-01 MED ORDER — DOCUSATE SODIUM 100 MG PO CAPS
100.0000 mg | ORAL_CAPSULE | Freq: Two times a day (BID) | ORAL | Status: DC
  Administered 2020-01-01 – 2020-01-02 (×3): 100 mg via ORAL
  Filled 2020-01-01 (×3): qty 1

## 2020-01-01 MED ORDER — VANCOMYCIN HCL IN DEXTROSE 1-5 GM/200ML-% IV SOLN
1000.0000 mg | Freq: Two times a day (BID) | INTRAVENOUS | Status: AC
  Administered 2020-01-01: 1000 mg via INTRAVENOUS
  Filled 2020-01-01: qty 200

## 2020-01-01 MED ORDER — VANCOMYCIN HCL IN DEXTROSE 1-5 GM/200ML-% IV SOLN
1000.0000 mg | INTRAVENOUS | Status: AC
  Administered 2020-01-01: 1000 mg via INTRAVENOUS
  Filled 2020-01-01: qty 200

## 2020-01-01 MED ORDER — ONDANSETRON HCL 4 MG/2ML IJ SOLN: 4.0000 mg | Freq: Four times a day (QID) | INTRAMUSCULAR | Status: DC | PRN

## 2020-01-01 MED ORDER — GLYCOPYRROLATE 0.2 MG/ML IJ SOLN
INTRAMUSCULAR | Status: DC | PRN
  Administered 2020-01-01: .2 mg via INTRAVENOUS

## 2020-01-01 MED ORDER — MENTHOL 3 MG MT LOZG: 1.0000 | LOZENGE | OROMUCOSAL | Status: DC | PRN

## 2020-01-01 MED ORDER — PHENYLEPHRINE HCL-NACL 10-0.9 MG/250ML-% IV SOLN
INTRAVENOUS | Status: DC | PRN
  Administered 2020-01-01 (×2): 25 ug/min via INTRAVENOUS

## 2020-01-01 MED ORDER — OXYCODONE HCL 5 MG PO TABS
10.0000 mg | ORAL_TABLET | ORAL | Status: DC | PRN
  Administered 2020-01-01 – 2020-01-02 (×3): 10 mg via ORAL
  Filled 2020-01-01 (×3): qty 2

## 2020-01-01 MED ORDER — ACETAMINOPHEN 325 MG PO TABS: 325.0000 mg | ORAL_TABLET | Freq: Four times a day (QID) | ORAL | Status: DC | PRN

## 2020-01-01 MED ORDER — DEXAMETHASONE SODIUM PHOSPHATE 10 MG/ML IJ SOLN
INTRAMUSCULAR | Status: AC
  Filled 2020-01-01: qty 1

## 2020-01-01 MED ORDER — MIDAZOLAM HCL 5 MG/5ML IJ SOLN
INTRAMUSCULAR | Status: DC | PRN
  Administered 2020-01-01: 2 mg via INTRAVENOUS

## 2020-01-01 MED ORDER — TRANEXAMIC ACID-NACL 1000-0.7 MG/100ML-% IV SOLN
1000.0000 mg | INTRAVENOUS | Status: AC
  Administered 2020-01-01: 08:00:00 1000 mg via INTRAVENOUS
  Filled 2020-01-01: qty 100

## 2020-01-01 MED ORDER — CITALOPRAM HYDROBROMIDE 20 MG PO TABS
20.0000 mg | ORAL_TABLET | Freq: Every day | ORAL | Status: DC
  Administered 2020-01-01 – 2020-01-02 (×2): 20 mg via ORAL
  Filled 2020-01-01 (×2): qty 1

## 2020-01-01 MED ORDER — FENTANYL CITRATE (PF) 250 MCG/5ML IJ SOLN
INTRAMUSCULAR | Status: AC
  Filled 2020-01-01: qty 5

## 2020-01-01 MED ORDER — 0.9 % SODIUM CHLORIDE (POUR BTL) OPTIME
TOPICAL | Status: DC | PRN
  Administered 2020-01-01 (×6): 1000 mL

## 2020-01-01 MED ORDER — ONDANSETRON HCL 4 MG/2ML IJ SOLN
INTRAMUSCULAR | Status: DC | PRN
  Administered 2020-01-01: 4 mg via INTRAVENOUS

## 2020-01-01 MED ORDER — ONDANSETRON HCL 4 MG PO TABS: 4.0000 mg | ORAL_TABLET | Freq: Four times a day (QID) | ORAL | Status: DC | PRN

## 2020-01-01 MED ORDER — PROMETHAZINE HCL 25 MG/ML IJ SOLN
6.2500 mg | INTRAMUSCULAR | Status: DC | PRN
  Administered 2020-01-01: 12.5 mg via INTRAVENOUS

## 2020-01-01 MED ORDER — ROCURONIUM BROMIDE 10 MG/ML (PF) SYRINGE
PREFILLED_SYRINGE | INTRAVENOUS | Status: DC | PRN
  Administered 2020-01-01: 20 mg via INTRAVENOUS
  Administered 2020-01-01: 50 mg via INTRAVENOUS

## 2020-01-01 MED ORDER — MIDAZOLAM HCL 2 MG/2ML IJ SOLN
INTRAMUSCULAR | Status: AC
  Filled 2020-01-01: qty 2

## 2020-01-01 MED ORDER — EPHEDRINE SULFATE 50 MG/ML IJ SOLN
INTRAMUSCULAR | Status: DC | PRN
  Administered 2020-01-01 (×2): 5 mg via INTRAVENOUS
  Administered 2020-01-01: 10 mg via INTRAVENOUS

## 2020-01-01 MED ORDER — LIDOCAINE 2% (20 MG/ML) 5 ML SYRINGE
INTRAMUSCULAR | Status: AC
  Filled 2020-01-01: qty 5

## 2020-01-01 MED ORDER — ONDANSETRON HCL 4 MG/2ML IJ SOLN
INTRAMUSCULAR | Status: AC
  Filled 2020-01-01: qty 2

## 2020-01-01 MED ORDER — VANCOMYCIN HCL 1000 MG IV SOLR
INTRAVENOUS | Status: DC | PRN
  Administered 2020-01-01: 1000 mg via TOPICAL

## 2020-01-01 MED ORDER — BUPIVACAINE HCL (PF) 0.5 % IJ SOLN
INTRAMUSCULAR | Status: DC | PRN
  Administered 2020-01-01: 15 mL via PERINEURAL

## 2020-01-01 MED ORDER — ACETAMINOPHEN 500 MG PO TABS
1000.0000 mg | ORAL_TABLET | Freq: Once | ORAL | Status: AC
  Administered 2020-01-01: 1000 mg via ORAL
  Filled 2020-01-01: qty 2

## 2020-01-01 MED ORDER — CELECOXIB 200 MG PO CAPS
200.0000 mg | ORAL_CAPSULE | Freq: Once | ORAL | Status: DC
  Filled 2020-01-01: qty 1

## 2020-01-01 MED ORDER — PROMETHAZINE HCL 25 MG/ML IJ SOLN
INTRAMUSCULAR | Status: AC
  Filled 2020-01-01: qty 1

## 2020-01-01 MED ORDER — PHENOL 1.4 % MT LIQD: 1.0000 | OROMUCOSAL | Status: DC | PRN

## 2020-01-01 MED ORDER — HYDROMORPHONE HCL 1 MG/ML IJ SOLN
0.5000 mg | INTRAMUSCULAR | Status: DC | PRN
  Administered 2020-01-02: 0.5 mg via INTRAVENOUS
  Filled 2020-01-01: qty 1

## 2020-01-01 MED ORDER — ROCURONIUM BROMIDE 10 MG/ML (PF) SYRINGE
PREFILLED_SYRINGE | INTRAVENOUS | Status: AC
  Filled 2020-01-01: qty 10

## 2020-01-01 MED ORDER — GLYCOPYRROLATE PF 0.2 MG/ML IJ SOSY
PREFILLED_SYRINGE | INTRAMUSCULAR | Status: AC
  Filled 2020-01-01: qty 1

## 2020-01-01 MED ORDER — LIDOCAINE 2% (20 MG/ML) 5 ML SYRINGE
INTRAMUSCULAR | Status: DC | PRN
  Administered 2020-01-01: 60 mg via INTRAVENOUS

## 2020-01-01 MED ORDER — METHOCARBAMOL 1000 MG/10ML IJ SOLN
500.0000 mg | Freq: Four times a day (QID) | INTRAVENOUS | Status: DC | PRN
  Filled 2020-01-01: qty 5

## 2020-01-01 MED ORDER — SUGAMMADEX SODIUM 200 MG/2ML IV SOLN
INTRAVENOUS | Status: DC | PRN
  Administered 2020-01-01: 150 mg via INTRAVENOUS

## 2020-01-01 MED ORDER — EPHEDRINE 5 MG/ML INJ
INTRAVENOUS | Status: AC
  Filled 2020-01-01: qty 10

## 2020-01-01 MED ORDER — PROPOFOL 10 MG/ML IV BOLUS
INTRAVENOUS | Status: DC | PRN
  Administered 2020-01-01: 160 mg via INTRAVENOUS
  Administered 2020-01-01: 30 mg via INTRAVENOUS

## 2020-01-01 MED ORDER — FENTANYL CITRATE (PF) 250 MCG/5ML IJ SOLN
INTRAMUSCULAR | Status: DC | PRN
  Administered 2020-01-01: 50 ug via INTRAVENOUS
  Administered 2020-01-01: 100 ug via INTRAVENOUS
  Administered 2020-01-01 (×2): 50 ug via INTRAVENOUS

## 2020-01-01 SURGICAL SUPPLY — 88 items
ALCOHOL 70% 16 OZ (MISCELLANEOUS) ×3 IMPLANT
AUG COMP REV MI TAPER ADAPTER (Joint) ×3 IMPLANT
AUGMENT COMP REV MI TAPR ADPTR (Joint) ×1 IMPLANT
BIT DRILL 2.7 W/STOP DISP (BIT) ×2 IMPLANT
BIT DRILL 2.7MM W/STOP DISP (BIT) ×1
BIT DRILL TWIST 2.7 (BIT) ×2 IMPLANT
BIT DRILL TWIST 2.7MM (BIT) ×1
BLADE SAW SGTL 13X75X1.27 (BLADE) ×3 IMPLANT
CHLORAPREP W/TINT 26 (MISCELLANEOUS) ×3 IMPLANT
CLOSURE STERI-STRIP 1/2X4 (GAUZE/BANDAGES/DRESSINGS) ×1
CLOSURE WOUND 1/2 X4 (GAUZE/BANDAGES/DRESSINGS) ×1
CLSR STERI-STRIP ANTIMIC 1/2X4 (GAUZE/BANDAGES/DRESSINGS) ×2 IMPLANT
COVER SURGICAL LIGHT HANDLE (MISCELLANEOUS) ×3 IMPLANT
COVER WAND RF STERILE (DRAPES) ×3 IMPLANT
DRAPE INCISE IOBAN 66X45 STRL (DRAPES) ×3 IMPLANT
DRAPE U-SHAPE 47X51 STRL (DRAPES) ×6 IMPLANT
DRSG AQUACEL AG ADV 3.5X10 (GAUZE/BANDAGES/DRESSINGS) ×3 IMPLANT
ELECT BLADE 4.0 EZ CLEAN MEGAD (MISCELLANEOUS) ×3
ELECT REM PT RETURN 9FT ADLT (ELECTROSURGICAL) ×3
ELECTRODE BLDE 4.0 EZ CLN MEGD (MISCELLANEOUS) ×1 IMPLANT
ELECTRODE REM PT RTRN 9FT ADLT (ELECTROSURGICAL) ×1 IMPLANT
GAUZE SPONGE 4X4 12PLY STRL LF (GAUZE/BANDAGES/DRESSINGS) ×3 IMPLANT
GLENOID SPHERE 36MM CVD +3 (Orthopedic Implant) ×3 IMPLANT
GLOVE BIOGEL PI IND STRL 7.0 (GLOVE) ×2 IMPLANT
GLOVE BIOGEL PI IND STRL 8 (GLOVE) ×1 IMPLANT
GLOVE BIOGEL PI INDICATOR 7.0 (GLOVE) ×4
GLOVE BIOGEL PI INDICATOR 8 (GLOVE) ×2
GLOVE ECLIPSE 7.0 STRL STRAW (GLOVE) ×9 IMPLANT
GLOVE ECLIPSE 8.0 STRL XLNG CF (GLOVE) ×3 IMPLANT
GOWN STRL REUS W/ TWL LRG LVL3 (GOWN DISPOSABLE) ×2 IMPLANT
GOWN STRL REUS W/ TWL XL LVL3 (GOWN DISPOSABLE) IMPLANT
GOWN STRL REUS W/TWL LRG LVL3 (GOWN DISPOSABLE) ×4
GOWN STRL REUS W/TWL XL LVL3 (GOWN DISPOSABLE)
GUIDE MODEL REV SHLD LT (ORTHOPEDIC DISPOSABLE SUPPLIES) ×3 IMPLANT
HYDROGEN PEROXIDE 16OZ (MISCELLANEOUS) ×3 IMPLANT
INSERT HUM BEARING 36 +3 (Insert) ×3 IMPLANT
JET LAVAGE IRRISEPT WOUND (IRRIGATION / IRRIGATOR) ×3
KIT BASIN OR (CUSTOM PROCEDURE TRAY) ×3 IMPLANT
KIT TURNOVER KIT B (KITS) ×3 IMPLANT
LAVAGE JET IRRISEPT WOUND (IRRIGATION / IRRIGATOR) ×1 IMPLANT
LOOP VESSEL MAXI BLUE (MISCELLANEOUS) ×3 IMPLANT
MANIFOLD NEPTUNE II (INSTRUMENTS) ×3 IMPLANT
NDL SUT 6 .5 CRC .975X.05 MAYO (NEEDLE) ×1 IMPLANT
NEEDLE MAYO TAPER (NEEDLE) ×2
NEEDLE TAPERED W/ NITINOL LOOP (MISCELLANEOUS) ×6 IMPLANT
NS IRRIG 1000ML POUR BTL (IV SOLUTION) ×9 IMPLANT
PACK SHOULDER (CUSTOM PROCEDURE TRAY) ×3 IMPLANT
PAD ARMBOARD 7.5X6 YLW CONV (MISCELLANEOUS) ×6 IMPLANT
PASSER SUT SWANSON 36MM LOOP (INSTRUMENTS) ×3 IMPLANT
PIN STEINMANN THREADED TIP (PIN) ×3 IMPLANT
PIN THREADED REVERSE (PIN) ×6 IMPLANT
REAMER GUIDE BUSHING SURG DISP (MISCELLANEOUS) ×3 IMPLANT
REAMER GUIDE W/SCREW AUG (MISCELLANEOUS) ×3 IMPLANT
RESTRAINT HEAD UNIVERSAL NS (MISCELLANEOUS) ×3 IMPLANT
SCREW BONE CORT 6.5X35MM (Screw) ×3 IMPLANT
SCREW LOCKING 4.75MMX15MM (Screw) ×6 IMPLANT
SCREW LOCKING NS 4.75MMX20MM (Screw) ×3 IMPLANT
SCREW LOCKING STRL 4.75X25X3.5 (Screw) ×3 IMPLANT
SLING ARM FOAM STRAP LRG (SOFTGOODS) ×3 IMPLANT
SLING ARM IMMOBILIZER LRG (SOFTGOODS) ×3 IMPLANT
SOL PREP POV-IOD 4OZ 10% (MISCELLANEOUS) ×3 IMPLANT
SPONGE LAP 18X18 RF (DISPOSABLE) ×3 IMPLANT
STEM HUMERAL STRL 12MMX122MM (Stem) ×3 IMPLANT
STRIP CLOSURE SKIN 1/2X4 (GAUZE/BANDAGES/DRESSINGS) ×2 IMPLANT
SUCTION FRAZIER HANDLE 10FR (MISCELLANEOUS) ×2
SUCTION TUBE FRAZIER 10FR DISP (MISCELLANEOUS) ×1 IMPLANT
SUT BROADBAND TAPE 2PK 1.5 (SUTURE) ×3 IMPLANT
SUT BROADBAND TAPE 2PK 2.3 (SUTURE) ×3 IMPLANT
SUT FIBERWIRE #2 38 T-5 BLUE (SUTURE)
SUT MAXBRAID (SUTURE) IMPLANT
SUT MNCRL AB 3-0 PS2 18 (SUTURE) ×3 IMPLANT
SUT SILK 2 0 TIES 10X30 (SUTURE) ×3 IMPLANT
SUT VIC AB 0 CT1 27 (SUTURE) ×8
SUT VIC AB 0 CT1 27XBRD ANBCTR (SUTURE) ×4 IMPLANT
SUT VIC AB 1 CT1 27 (SUTURE) ×4
SUT VIC AB 1 CT1 27XBRD ANBCTR (SUTURE) ×2 IMPLANT
SUT VIC AB 2-0 CT1 27 (SUTURE) ×6
SUT VIC AB 2-0 CT1 TAPERPNT 27 (SUTURE) ×3 IMPLANT
SUT VICRYL 0 UR6 27IN ABS (SUTURE) ×18 IMPLANT
SUTURE FIBERWR #2 38 T-5 BLUE (SUTURE) IMPLANT
SUTURE TAPE 1.3 40 TPR END (SUTURE) ×1 IMPLANT
SUTURETAPE 1.3 40 TPR END (SUTURE) ×3
TOWEL GREEN STERILE (TOWEL DISPOSABLE) ×3 IMPLANT
TRAY CATH 16FR W/PLASTIC CATH (SET/KITS/TRAYS/PACK) ×3 IMPLANT
TRAY FOL W/BAG SLVR 16FR STRL (SET/KITS/TRAYS/PACK) IMPLANT
TRAY FOLEY W/BAG SLVR 16FR LF (SET/KITS/TRAYS/PACK)
TRAY HUM MINI SHOULDER +0 40D (Shoulder) ×3 IMPLANT
WATER STERILE IRR 1000ML POUR (IV SOLUTION) ×3 IMPLANT

## 2020-01-01 NOTE — Anesthesia Procedure Notes (Signed)
Anesthesia Regional Block: Interscalene brachial plexus block   Pre-Anesthetic Checklist: ,, timeout performed, Correct Patient, Correct Site, Correct Laterality, Correct Procedure, Correct Position, site marked, Risks and benefits discussed,  Surgical consent,  Pre-op evaluation,  At surgeon's request and post-op pain management  Laterality: Left  Prep: chloraprep       Needles:  Injection technique: Single-shot  Needle Type: Echogenic Stimulator Needle     Needle Length: 5cm  Needle Gauge: 22     Additional Needles:   Procedures:,,,, ultrasound used (permanent image in chart),,,,  Narrative:  Start time: 01/01/2020 7:01 AM End time: 01/01/2020 7:11 AM Injection made incrementally with aspirations every 5 mL.  Performed by: Personally  Anesthesiologist: Cecile Hearing, MD  Additional Notes: Functioning IV was confirmed and monitors were applied.  A 60mm 22ga Arrow echogenic stimulator needle was used. Sterile prep and drape, hand hygiene, and sterile gloves were used.  Negative aspiration and negative test dose prior to incremental administration of local anesthetic. The patient tolerated the procedure well.  Ultrasound guidance: relevent anatomy identified, needle position confirmed, local anesthetic spread visualized around nerve(s), vascular puncture avoided.  Image printed for medical record.

## 2020-01-01 NOTE — Transfer of Care (Signed)
Immediate Anesthesia Transfer of Care Note  Patient: Casey [name]  Procedure(s) Performed: LEFT REVERSE SHOULDER ARTHROPLASTY (Left Shoulder)  Patient Location: PACU  Anesthesia Type:GA combined with regional for post-op pain  Level of Consciousness: awake, alert  and oriented  Airway & Oxygen Therapy: Patient Spontanous Breathing and Patient connected to nasal cannula oxygen  Post-op Assessment: Report given to RN and Post -op Vital signs reviewed and stable  Post vital signs: Reviewed and stable  Last Vitals:  Vitals Value Taken Time  BP 139/83 01/01/20 1212  Temp    Pulse 102 01/01/20 1216  Resp 26 01/01/20 1216  SpO2 94 % 01/01/20 1216  Vitals shown include unvalidated device data.  Last Pain:  Vitals:   01/01/20 0612  TempSrc: Oral         Complications: No apparent anesthesia complications

## 2020-01-01 NOTE — Plan of Care (Signed)
  Problem: Education: Goal: Knowledge of the prescribed therapeutic regimen will improve Outcome: Progressing Goal: Understanding of activity limitations/precautions following surgery will improve Outcome: Progressing   Problem: Activity: Goal: Ability to tolerate increased activity will improve Outcome: Progressing   Problem: Pain Management: Goal: Pain level will decrease with appropriate interventions Outcome: Progressing   

## 2020-01-01 NOTE — Anesthesia Procedure Notes (Signed)
Procedure Name: Intubation Date/Time: 01/01/2020 7:40 AM Performed by: Marena Chancy, CRNA Pre-anesthesia Checklist: Patient identified, Emergency Drugs available, Suction available and Patient being monitored Patient Re-evaluated:Patient Re-evaluated prior to induction Oxygen Delivery Method: Circle System Utilized Preoxygenation: Pre-oxygenation with 100% oxygen Induction Type: IV induction Ventilation: Mask ventilation without difficulty Laryngoscope Size: Miller and 2 Grade View: Grade I Tube type: Oral Tube size: 7.0 mm Number of attempts: 1 Airway Equipment and Method: Stylet and Oral airway Placement Confirmation: ETT inserted through vocal cords under direct vision,  positive ETCO2 and breath sounds checked- equal and bilateral Tube secured with: Tape Dental Injury: Teeth and Oropharynx as per pre-operative assessment

## 2020-01-01 NOTE — Care Plan (Signed)
RNCM asked to help with CM needs prior to surgery as patient is scheduled for same day discharge for a Left reversal with shoulder arthroplasty on Monday, 01/01/20 per Dr. August Saucer. Spoke with patient this Wednesday, 12/27/19. Reviewed all post-op instructions and she verbalized understanding. HHPT has been ordered. Referral made to [facility]. Choice provided. [employer] provided home CPM/brace. Will follow for any other CM needs related to same day discharge. Please call Ralph Dowdy, RN, BSN, CCM with [facility] at [phone] with questions or changes to discharge plan.

## 2020-01-01 NOTE — H&P (Addendum)
Casey [name] is an 59 y.o. female.   Chief Complaint: Left shoulder pain HPI: Casey [name] is a 59 year old patient with left shoulder pain.  She sustained a left proximal humerus fracture several months ago.  That fracture is gone on to heal with malunited position.  She reports inability to raise the arm due to this malunited position.  Presents now for operative management after explanation of risks and benefits.  Past Medical History:  Diagnosis Date  . Anxiety   . Chronic back pain   . Depression   . History of panic attacks 09/11/2014  . Medical history non-contributory   . Ruptured disc, thoracic   . Suicide attempt (HCC) 2002    Past Surgical History:  Procedure Laterality Date  . CESAREAN SECTION    . CLAVICLE SURGERY    . TUBAL LIGATION      Family History  Problem Relation Age of Onset  . Diabetes Mother   . Pulmonary fibrosis Mother   . Heart attack Father   . Parkinson's disease Sister   . Bipolar disorder Sister   . Thyroid disease Sister   . Heart attack Brother   . Other Son        vascular neucrosis  . Pulmonary fibrosis Maternal Grandmother   . Diabetes Maternal Grandfather   . Stroke Maternal Grandfather   . Pneumonia Paternal Grandfather   . Cancer Sister        melonoma  . Other Brother        back problems, nerve problems, soft bones   Social History:  reports that she has been smoking cigarettes. She has been smoking about 0.25 packs per day. She has never used smokeless tobacco. She reports current alcohol use of about 1.0 standard drinks of alcohol per week. She reports that she does not use drugs.  Allergies:  Allergies  Allergen Reactions  . Ampicillin Other (See Comments)    Reaction is unknown    Medications Prior to Admission  Medication Sig Dispense Refill  . calcium-vitamin D (OSCAL WITH D) 500-200 MG-UNIT tablet Take 1 tablet by mouth daily with breakfast. For bone health    . celecoxib (CELEBREX) 200 MG capsule Take 1 capsule (200 mg  total) by mouth 2 (two) times daily. Additionally, take 400mg  (2 capsules) by mouth the morning of your surgery as soon as you wake up with a small sip of water. 60 capsule 0  . citalopram (CELEXA) 20 MG tablet Take 20 mg by mouth daily.     oxycodone (OXY-IR) 5 MG capsule Take 1 capsule (5 mg total) by mouth every 8 (eight) hours as needed for pain. 7 capsule 0  . HYDROcodone-acetaminophen (NORCO/VICODIN) 5-325 MG tablet Take 1 tablet by mouth every 4 (four) hours as needed for moderate pain. (Patient not taking: Reported on 12/20/2019) 30 tablet 0  . ibuprofen (ADVIL,MOTRIN) 600 MG tablet Take 1 tablet (600 mg total) by mouth every 6 (six) hours as needed. (Patient not taking: Reported on 12/20/2019) 30 tablet 0  . omega-3 acid ethyl esters (LOVAZA) 1 g capsule Take 1 capsule (1 g total) by mouth daily before breakfast. For high cholesterol (Patient not taking: Reported on 12/20/2019) 30 capsule 0  . potassium chloride (KLOR-CON) 10 MEQ tablet Take 1 tablet (10 mEq total) by mouth daily for 5 days. (Patient not taking: Reported on 12/20/2019) 5 tablet 0    Results for orders placed or performed during the hospital encounter of 01/01/20 (from the past 48 hour(s))  CBC     Status: Abnormal   Collection Time: 01/01/20  6:12 AM  Result Value Ref Range   WBC 7.4 4.0 - 10.5 K/uL   RBC 4.04 3.87 - 5.11 MIL/uL   Hemoglobin 13.2 12.0 - 15.0 g/dL   HCT 40.7 36.0 - 46.0 %   MCV 100.7 (H) 80.0 - 100.0 fL   MCH 32.7 26.0 - 34.0 pg   MCHC 32.4 30.0 - 36.0 g/dL   RDW 13.8 11.5 - 15.5 %   Platelets 419 (H) 150 - 400 K/uL   nRBC 0.0 0.0 - 0.2 %    Comment: Performed at [facility], [place] [address] [place], [place] [address]  Basic metabolic panel     Status: Abnormal   Collection Time: 01/01/20  6:12 AM  Result Value Ref Range   Sodium 137 135 - 145 mmol/L   Potassium 3.9 3.5 - 5.1 mmol/L   Chloride 105 98 - 111 mmol/L   CO2 20 (L) 22 - 32 mmol/L   Glucose, Bld 107 (H) 70 - 99 mg/dL   BUN 6 6 -  20 mg/dL   Creatinine, Ser 0.57 0.44 - 1.00 mg/dL   Calcium 8.7 (L) 8.9 - 10.3 mg/dL   GFR calc non Af Amer >60 >60 mL/min   GFR calc Af Amer >60 >60 mL/min   Anion gap 12 5 - 15    Comment: Performed at [place] [address], [place], [place] [address]   No results found.  Review of Systems  Musculoskeletal: Positive for arthralgias.  All other systems reviewed and are negative.   Blood pressure (!) 134/96, pulse 61, temperature 97.8 F (36.6 C), temperature source Oral, resp. rate 18, height 5\' 5"  (1.651 m), weight 65.8 kg, SpO2 98 %. Physical Exam  Constitutional: She appears well-developed.  HENT:  Head: Normocephalic.  Eyes: Pupils are equal, round, and reactive to light.  Cardiovascular: Normal rate.  Respiratory: Effort normal.  Musculoskeletal:     Cervical back: Normal range of motion.  Neurological: She is alert.  Skin: Skin is warm.  Psychiatric: She has a normal mood and affect.  Left shoulder examination demonstrates limited active forward flexion abduction to less than 20 degrees each  max.  Deltoid is functional.  Motor or sensory function to the hand is intact.  Slight amount of biceps atrophy present.  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  There is also varus alignment of that articular segment.  This is not something that we can do an osteotomy on and restore the normal biomechanics of the shoulder.  Her best and only option really is reverse shoulder replacement.  Although she is young for that it is really the only procedure that can give her a chance that overhead motion as well as pain relief.  The risk and benefits are discussed with the patient including but not limited to infection nerve vessel damage potential need for revision in her lifetime along with risk of dislocation  and incomplete pain relief.  Patient understands the risk and benefits.  All questions answered.  Anderson Malta, MD 01/01/2020, 7:25 AM

## 2020-01-01 NOTE — Op Note (Signed)
NAME: Casey [name], [facility] [id number] ACCOUNT [id number] DATE OF BIRTH:1961/04/15 FACILITY: [facility] LOCATION: [facility] PHYSICIAN: Diamantina Providence, MD  OPERATIVE REPORT  DATE OF PROCEDURE:  01/01/2020  PREOPERATIVE DIAGNOSIS:  Left shoulder proximal humerus fracture nonunion.  POSTOPERATIVE DIAGNOSIS:  Left shoulder proximal humerus fracture nonunion.  PROCEDURE:  Left shoulder proximal humerus reverse shoulder replacement using Biomet components, humeral fracture stem size 12, comprehensive reverse glenosphere 36 mm +3 offset with 1 central compression screw and 4 peripheral locking screws with small  augmented baseplate and mini humeral tray, standard thickness 40 mm in diameter with highly cross-linked +3 retentive thickness bearing, 36 mm.  SURGEON:  Cammy Copa, MD  ASSISTANT:  Karenann Cai, PA-C  INDICATIONS:  This is a 59 year old patient with left proximal humerus fracture malunion about 2 months out.  Referred here for further management.  She has very limited range of motion and strength.  Presents now for operative management after  explanation of risks and benefits.  PROCEDURE IN DETAIL:  The patient was brought to the operating room where general anesthetic was induced.  Preoperative antibiotics administered.  Timeout was called.  The patient was placed in the beach chair position with head in neutral position.   Left arm had been cleansed with benzoyl peroxide for 3 days preoperatively daily.  Currently, we used hydrogen peroxide on the arm and axilla, followed by alcohol and Betadine, which was allowed to air dry, then prepped with ChloraPrep solution.  Ioban  used to cover the operative field.  Preoperative range of motion was 0 degrees of external rotation and about 20 degrees passively of abduction.  Timeout was called.  Deltopectoral approach was made beginning at the coracoid, extending distally about 3  cm distal to the axillary crease and lateral by 2 cm  to the axillary crease.  Skin and subcutaneous tissue were sharply divided.  Bleeding points encountered were controlled using electrocautery.  Cephalic vein mobilized laterally.  It required ligation  due to avulsion of a branch.  The patient's humeral shaft was anterior.  Blunt dissection was used with a Cobb elevator and manual dissection in order to elevate the subdeltoid region.  Next, the biceps tendon was identified and the rotator interval was  opened up to the glenoid.  Biceps tendon was then tenodesed to the pec tendon.  Next, the circumflex vessels were identified and ligated.  The axillary nerve was then identified and a vessel loop placed around it.  It was protected at all times during  the remaining portion of the case.  Normal bony anatomy was distorted due to the retrograde position of the humeral head.  Osteotomy on the lesser tuberosity was performed and this was held with 2 MaxBraid sutures.  Next, osteotomy on the humeral head  was performed.  The greater tuberosity was then osteotomized and mobilized.  At this time, the canal was sounded with a small drill bit.  Then, progressive reaming was taken up to size 12.  A size 10 broach was then placed and the cap applied.  At this  time for glenoid exposure, the patient was reversed and a muscle relaxer was on board.  A 360-degree release of the subscapularis was performed with care being taken not to injure the axillary nerve.  Capsular release was performed.  Bankart lesion  created.  The patient-specific instrumentation was then placed and correct orientation and position of the implant was confirmed.  Bone quality was marginal to poor.  The guide pin was placed.  Reaming was performed for augmentation.  Secondary reaming  was performed primarily superiorly.  Next, the baseplate was placed in the central compression screw did achieve good fixation.  Four peripheral locking screws were then placed also with good fixation.  Due to the  distracted malunited fracture, the gap  space was significant.  Therefore, a +3 glenosphere was chosen.  This was tapped into position with excellent fixation achieved.  Next, attention was directed towards the humerus.  Multiple combinations of reduction were performed with the broach in  place and the most stable was the standard thickness 0 taper offset humeral tray with +3 retentive thickness, 36 mm liner.  This gave very good stability with difficulty in reduction and relocation, along with appropriate offset and tension on both the  deltoid and the conjoined tendon.  The patient had good stability to extension, adduction, forward flexed and a forward ____ placed.  This was dislocated.  True components placed, including a fracture stem as well as the humeral trays.  A thorough  irrigation was performed.  Same stability parameters were maintained.  We were able to reattach the subscap through the prosthesis using the FiberTape and this was done in approximately 25 degrees of external rotation.  The greater tuberosity was also  reattached to the shaft using 3 FiberTapes placed through the holes in the humeral stem.  With this, good reduction was obtained.  Bone graft from the humeral head was utilized.  Thorough irrigation again performed both with Irrisept, which was used at  all times during the case for infection prevention as well as vancomycin powder, which was placed both within the incision.  Rotator interval was then closed with 2 sutures laterally with the arm in external rotation.  Next, the deltopectoral interval  was closed using #1 Vicryl suture.  Irrigation again, followed by vancomycin powder was performed.  Next, 0 Vicryl suture, 2-0 Vicryl suture and 3-0 Monocryl was used to close the skin with Steri-Strips and an Aquacel dressing applied.  The patient  tolerated the procedure well without immediate complications, transferred to the recovery room in stable condition.  Vessel loop was  removed from the axillary nerve prior to closure.  That nerve was palpable and intact.  The patient was placed in a  shoulder immobilizer.  She tolerated the procedure well without immediate complication.  Luke's assistance was required at all times during the case for retraction and mobilization of structures,  opening and closing.  His assistance was a medical  necessity.  VN/NUANCE  D:01/01/2020 T:01/01/2020 JOB:[id number]

## 2020-01-01 NOTE — Brief Op Note (Signed)
   01/01/2020  11:23 AM  PATIENT:  Casey [name]  59 y.o. female  PRE-OPERATIVE DIAGNOSIS:  left proximal humerus fracture  POST-OPERATIVE DIAGNOSIS:  left proximal humerus fracture  PROCEDURE:  Procedure(s): LEFT REVERSE SHOULDER ARTHROPLASTY  SURGEON:  Surgeon(s): Cammy Copa, MD  ASSISTANT: magnant pa  ANESTHESIA:   general  EBL: 100 ml    Total I/O In: 1000 [I.V.:1000] Out: 175 [Blood:175]  BLOOD ADMINISTERED: none  DRAINS: none   LOCAL MEDICATIONS USED:  none  SPECIMEN:  No Specimen  COUNTS:  YES  TOURNIQUET:  * No tourniquets in log *  DICTATION: .Other Dictation: Dictation Number [id number]  PLAN OF CARE: Admit for overnight observation  PATIENT DISPOSITION:  PACU - hemodynamically stable

## 2020-01-01 NOTE — Progress Notes (Signed)
Occupational Therapy Evaluation Patient Details Name: Casey [name] MRN: [id number] DOB: October 01, 1961 Today's Date: 01/01/2020    History of Present Illness Casey [name] is a 59 year old patient with left shoulder pain.  She sustained a left proximal humerus fracture several months ago.  That fracture is gone on to heal with malunited position. s/p left reverse shoulder arthoplasty.   Clinical Impression   PTA, pt was living at home with her son and daughter-in-law, pt reports she was working and driving and was independent with ADL/IADL and functional mobility. Pt s/p left shoulder arthroplasty this date, she reports still feeling "out of it" following procedure. Pt currently requires minA for sit<>stand with reports of dizziness initially. VSS. Therapist educated pt on shoulder protocol, weight bearing precautions, donning/doffing sling and proper wear schedule, and educated pt on elbow, wrist, and hand HEP with provided handouts. Pt reports numbness from shoulder to wrist, able to make a fist and extend wrist. Pt will benefit from followup OT services prior to discharge to address dressing and pt carryover of precautions and exercises.     Follow Up Recommendations  Follow surgeon's recommendation for DC plan and follow-up therapies    Equipment Recommendations  None recommended by OT    Recommendations for Other Services       Precautions / Restrictions Precautions Precautions: Fall;Shoulder Type of Shoulder Precautions: SlingAt all times except ADL / exercise;NWB;AROM elbow wrist and hand allowed;pendulums okay;NO shoulder AROM Shoulder Interventions: Shoulder sling/immobilizer;At all times;Off for dressing/bathing/exercises Precaution Booklet Issued: Yes (comment) Precaution Comments: provided shoulder protocol handout and verbally reviewed with patient Required Braces or Orthoses: Sling Restrictions Weight Bearing Restrictions: Yes LUE Weight Bearing: Non weight bearing       Mobility Bed Mobility Overal bed mobility: Needs Assistance Bed Mobility: Supine to Sit;Sit to Supine     Supine to sit: Min guard;HOB elevated Sit to supine: Min assist   General bed mobility comments: minA to regress to supine for protection of LUE  Transfers Overall transfer level: Needs assistance Equipment used: None Transfers: Sit to/from Stand Sit to Stand: Min assist         General transfer comment: pt reports brief dizziness in standing;minA for stability    Balance Overall balance assessment: Mild deficits observed, not formally tested                                         ADL either performed or assessed with clinical judgement   ADL Overall ADL's : Needs assistance/impaired Eating/Feeding: Set up;Sitting   Grooming: Min guard;Sitting   Upper Body Bathing: Minimal assistance;Sitting   Lower Body Bathing: Sit to/from stand;Minimal assistance   Upper Body Dressing : Sitting;Moderate assistance Upper Body Dressing Details (indicate cue type and reason): modA for donning/doffing sling Lower Body Dressing: Minimal assistance;Sit to/from stand       Toileting- Architect and Hygiene: Minimal assistance;Sit to/from stand Toileting - Clothing Manipulation Details (indicate cue type and reason): minA for stability     Functional mobility during ADLs: Minimal assistance General ADL Comments: pt sat EOB to review shoulder protocol;reports dizziness with standing;     Vision         Perception     Praxis      Pertinent Vitals/Pain Pain Assessment: 0-10 Pain Score: 2  Pain Location: L shoulder Pain Descriptors / Indicators: Discomfort;Sore Pain Intervention(s): Limited activity within patient's tolerance;Monitored during session;Repositioned;Ice applied  Hand Dominance Right   Extremity/Trunk Assessment Upper Extremity Assessment Upper Extremity Assessment: LUE deficits/detail LUE Deficits / Details: continues  to present with nerve block in effect;pt reports numbness in shoulder to wrist, not in hand;decreased fine motor control;able to extend her wrist, unable to flex; LUE: Unable to fully assess due to immobilization LUE Sensation: decreased light touch LUE Coordination: decreased fine motor;decreased gross motor   Lower Extremity Assessment Lower Extremity Assessment: Defer to PT evaluation   Cervical / Trunk Assessment Cervical / Trunk Assessment: Normal   Communication Communication Communication: No difficulties   Cognition Arousal/Alertness: Awake/alert Behavior During Therapy: WFL for tasks assessed/performed Overall Cognitive Status: Within Functional Limits for tasks assessed                                 General Comments: pt reports feeling "overwhelmed" at start of session, WFL for basic tasks   General Comments  vss    Exercises Exercises: Shoulder;Other exercises Other Exercises Other Exercises: educated pt on AROM elbow wrist and hand;did not complete as pt's UE was still numb   Shoulder Instructions Shoulder Instructions Donning/doffing sling/immobilizer: Moderate assistance Correct positioning of sling/immobilizer: Moderate assistance ROM for elbow, wrist and digits of operated UE: Moderate assistance Sling wearing schedule (on at all times/off for ADL's): Moderate assistance    Home Living Family/patient expects to be discharged to:: Private residence Living Arrangements: Children Available Help at Discharge: Family;Available 24 hours/day Type of Home: House Home Access: Stairs to enter [employer] of Steps: 1-2   Home Layout: One level     Bathroom Shower/Tub: Tub/shower unit;Door   [employer] Toilet: Standard     Home Equipment: None          Prior Functioning/Environment Level of Independence: Independent        Comments: pt was working, driving, and independent with all ADL/IADL         OT Problem List: Impaired  balance (sitting and/or standing);Decreased safety awareness;Decreased range of motion;Decreased knowledge of precautions;Impaired UE functional use;Pain      OT Treatment/Interventions: Self-care/ADL training;Therapeutic exercise;Splinting;Therapeutic activities;Patient/family education;Balance training    OT Goals(Current goals can be found in the care plan section) Acute Rehab OT Goals Patient Stated Goal: to go home OT Goal Formulation: With patient Time For Goal Achievement: 01/15/20 Potential to Achieve Goals: Good ADL Goals Additional ADL Goal #1: Pt will complete UB dressing with supervision while adhering to shoulder precautions. Additional ADL Goal #2: Pt will demonstrate independence with donning/doffing sling and verbalize appropriate wear schedule. Additional ADL Goal #3: Pt will demonstrate independence with elbow, wrist, and hand HEP.  OT Frequency: Min 2X/week   Barriers to D/C: Decreased caregiver support  pt reports her son is on disability but able to physically assist       Co-evaluation              AM-PAC OT "6 Clicks" Daily Activity     Outcome Measure Help from another person eating meals?: None Help from another person taking care of personal grooming?: None Help from another person toileting, which includes using toliet, bedpan, or urinal?: None Help from another person bathing (including washing, rinsing, drying)?: None Help from another person to put on and taking off regular upper body clothing?: None Help from another person to put on and taking off regular lower body clothing?: None 6 Click Score: 24   End of Session Equipment Utilized During Treatment: Other (  comment)(sling) Nurse Communication: Mobility status  Activity Tolerance: Patient tolerated treatment well Patient left: in bed;with call bell/phone within reach;with bed alarm set  OT Visit Diagnosis: Unsteadiness on feet (R26.81);Other abnormalities of gait and mobility  (R26.89);Pain Pain - Right/Left: Left Pain - part of body: Shoulder                Time: 2248-2500 OT Time Calculation (min): 30 min Charges:  OT General Charges $OT Visit: 1 Visit OT Evaluation $OT Eval Low Complexity: 1 Low OT Treatments $Self Care/Home Management : 8-22 mins  Diona Browner OTR/L Acute Rehabilitation Services Office: [phone]   Rebeca Alert 01/01/2020, 5:36 PM

## 2020-01-01 NOTE — Anesthesia Postprocedure Evaluation (Signed)
Anesthesia Post Note  Patient: Casey [name]  Procedure(s) Performed: LEFT REVERSE SHOULDER ARTHROPLASTY (Left Shoulder)     Patient location during evaluation: PACU Anesthesia Type: General Level of consciousness: awake and alert Pain management: pain level controlled Vital Signs Assessment: post-procedure vital signs reviewed and stable Respiratory status: spontaneous breathing, nonlabored ventilation, respiratory function stable and patient connected to nasal cannula oxygen Cardiovascular status: blood pressure returned to baseline and stable Postop Assessment: no apparent nausea or vomiting Anesthetic complications: no    Last Vitals:  Vitals:   01/01/20 1300 01/01/20 1315  BP: 114/66 117/69  Pulse: 90 85  Resp: 20 (!) 22  Temp:  36.9 C  SpO2: 93% 93%                   Shelton Silvas

## 2020-01-02 ENCOUNTER — Encounter: Payer: Self-pay | Admitting: *Deleted

## 2020-01-02 DIAGNOSIS — S42202K Unspecified fracture of upper end of left humerus, subsequent encounter for fracture with nonunion: Secondary | ICD-10-CM | POA: Diagnosis not present

## 2020-01-02 DIAGNOSIS — F329 Major depressive disorder, single episode, unspecified: Secondary | ICD-10-CM | POA: Diagnosis not present

## 2020-01-02 DIAGNOSIS — X58XXXD Exposure to other specified factors, subsequent encounter: Secondary | ICD-10-CM | POA: Diagnosis not present

## 2020-01-02 DIAGNOSIS — F17210 Nicotine dependence, cigarettes, uncomplicated: Secondary | ICD-10-CM | POA: Diagnosis not present

## 2020-01-02 DIAGNOSIS — S42202A Unspecified fracture of upper end of left humerus, initial encounter for closed fracture: Secondary | ICD-10-CM | POA: Diagnosis not present

## 2020-01-02 MED ORDER — OXYCODONE HCL 10 MG PO TABS: 10.0000 mg | ORAL_TABLET | ORAL | 0 refills | Status: DC | PRN

## 2020-01-02 MED ORDER — METHOCARBAMOL 500 MG PO TABS: 500.0000 mg | ORAL_TABLET | Freq: Three times a day (TID) | ORAL | 0 refills | Status: DC | PRN

## 2020-01-02 NOTE — Progress Notes (Signed)
Occupational Therapy Treatment Patient Details Name: Casey [name] MRN: [id number] DOB: [birth year] Today's Date: 01/02/2020    History of present illness Casey [name] is a 59 year old patient with left shoulder pain due to malunion admitted for reverse TSA 2/22.  She sustained a left proximal humerus fracture several months ago. PMHx: panic attacks, clavicle fx   OT comments  Pt sitting EOB upon OT arrival, pt verbalized 90% of information covered in yesterday's evaluation correctly. She required minguard to complete grooming at sink level for cues to adhere to shoulder ROM precautions, she demonstrates adherence to weight bearing precaution. She required minA for donning UB clothing and donning/doffing sling. Pt reports her daughter-in-law will be able to assist at home. Pt completed hand, wrist, and elbow ROM exercises while seated. She verbalized correct wear schedule of her sling. Will continue to follow pt acutely to maximize her independence with ADL completion and independence with her HEP.    Follow Up Recommendations  Follow surgeon's recommendation for DC plan and follow-up therapies    Equipment Recommendations  None recommended by OT    Recommendations for Other Services      Precautions / Restrictions Precautions Precautions: Fall;Shoulder Type of Shoulder Precautions: SlingAt all times except ADL / exercise;NWB;AROM elbow wrist and hand allowed;pendulums okay;NO shoulder AROM Shoulder Interventions: Shoulder sling/immobilizer;At all times;Off for dressing/bathing/exercises Precaution Booklet Issued: Yes (comment) Precaution Comments: provided shoulder protocol handout and verbally reviewed with patient Required Braces or Orthoses: Sling Restrictions Weight Bearing Restrictions: Yes LUE Weight Bearing: Non weight bearing       Mobility Bed Mobility Overal bed mobility: Modified Independent Bed Mobility: Supine to Sit;Sit to Supine           General bed mobility  comments: pt sittin gEOB upon arrival  Transfers Overall transfer level: Modified independent                    Balance Overall balance assessment: No apparent balance deficits (not formally assessed)                                         ADL either performed or assessed with clinical judgement   ADL Overall ADL's : Needs assistance/impaired     Grooming: Min guard;Standing Grooming Details (indicate cue type and reason): completed at sink level;minguard to limit involvement of L shoulder Upper Body Bathing: Minimal assistance;Standing Upper Body Bathing Details (indicate cue type and reason): minA to wash under armpits;educated pt on dangling LUE when washing under L armpit     Upper Body Dressing : Minimal assistance;Sitting Upper Body Dressing Details (indicate cue type and reason): minA to don shirt while sitting in recliner Lower Body Dressing: Sit to/from stand;Modified independent Lower Body Dressing Details (indicate cue type and reason): donned LB clothing Toilet Transfer: Ambulation;Supervision/safety Toilet Transfer Details (indicate cue type and reason): supervision for line management Toileting- Clothing Manipulation and Hygiene: Modified independent       Functional mobility during ADLs: Min guard General ADL Comments: minguard for safety;pt with good carry over from yesterdays session, verbalized proper strategy for dressing, proper wear schedule for sling,      Vision       Perception     Praxis      Cognition Arousal/Alertness: Awake/alert Behavior During Therapy: WFL for tasks assessed/performed Overall Cognitive Status: Within Functional Limits for tasks assessed  General Comments: pt verbalized great carry over from yesterday's session        Exercises Exercises: Shoulder Shoulder Exercises Pendulum Exercise: Left;5 reps;Standing Elbow Flexion: AROM;Left;10  reps;Seated Elbow Extension: AROM;Left;10 reps;Seated Wrist Flexion: AROM;Left;10 reps;Seated Wrist Extension: AROM;Left;10 reps;Seated Digit Composite Flexion: AROM;Left;10 reps;Seated Composite Extension: AROM;Left;10 reps;Seated Other Exercises Other Exercises: supination/pronation x10   Shoulder Instructions Shoulder Instructions Donning/doffing shirt without moving shoulder: Minimal assistance Method for sponge bathing under operated UE: Minimal assistance Donning/doffing sling/immobilizer: Minimal assistance Correct positioning of sling/immobilizer: Independent Pendulum exercises (written home exercise program): Moderate assistance ROM for elbow, wrist and digits of operated UE: Supervision/safety Sling wearing schedule (on at all times/off for ADL's): Modified independent Proper positioning of operated UE when showering: Supervision/safety     General Comments vss    Pertinent Vitals/ Pain       Pain Assessment: 0-10 Pain Score: 6  Pain Location: L shoulder Pain Descriptors / Indicators: Discomfort;Sore Pain Intervention(s): Limited activity within patient's tolerance;Monitored during session  Home Living Family/patient expects to be discharged to:: Private residence Living Arrangements: Children Available Help at Discharge: Family;Available 24 hours/day Type of Home: House Home Access: Stairs to enter [employer] of Steps: 1   Home Layout: One level     Bathroom Shower/Tub: Tub/shower unit;Door   [employer] Toilet: Standard     Home Equipment: None          Prior Functioning/Environment Level of Independence: Independent        Comments: pt was working, driving, and independent with all ADL/IADL    Frequency  Min 2X/week        Progress Toward Goals  OT Goals(current goals can now be found in the care plan section)  Progress towards OT goals: Progressing toward goals  Acute Rehab OT Goals Patient Stated Goal: to go home OT Goal  Formulation: With patient Time For Goal Achievement: 01/15/20 Potential to Achieve Goals: Good ADL Goals Additional ADL Goal #1: Pt will complete UB dressing with supervision while adhering to shoulder precautions. Additional ADL Goal #2: Pt will demonstrate independence with donning/doffing sling and verbalize appropriate wear schedule. Additional ADL Goal #3: Pt will demonstrate independence with elbow, wrist, and hand HEP.  Plan Discharge plan remains appropriate    Co-evaluation                 AM-PAC OT "6 Clicks" Daily Activity     Outcome Measure   Help from another person eating meals?: None Help from another person taking care of personal grooming?: A Little Help from another person toileting, which includes using toliet, bedpan, or urinal?: None Help from another person bathing (including washing, rinsing, drying)?: None Help from another person to put on and taking off regular upper body clothing?: A Little Help from another person to put on and taking off regular lower body clothing?: None 6 Click Score: 22    End of Session Equipment Utilized During Treatment: Other (comment)(sling)  OT Visit Diagnosis: Unsteadiness on feet (R26.81);Other abnormalities of gait and mobility (R26.89);Pain Pain - Right/Left: Left Pain - part of body: Shoulder   Activity Tolerance Patient tolerated treatment well   Patient Left in chair;with call bell/phone within reach   Nurse Communication Mobility status        Time: 2992-4268 OT Time Calculation (min): 46 min  Charges: OT General Charges $OT Visit: 1 Visit OT Treatments $Self Care/Home Management : 38-52 mins  Diona Browner OTR/L Acute Rehabilitation Services Office: [phone]    Rebeca Alert 01/02/2020,  11:39 AM

## 2020-01-02 NOTE — Progress Notes (Signed)
DC instructions provided to patient. Verbalized understanding. Patient states her ride home will arrive in a few minutes. Instructed to call for assistance.

## 2020-01-02 NOTE — TOC Initial Note (Signed)
Transition of Care [facility]) - Initial/Assessment Note    Patient Details  Name: Casey [name] MRN: [id number] Date of Birth: Jul 08, 1961  Transition of Care [facility]) CM/SW Contact:    Kingsley Plan, RN Phone Number: 01/02/2020, 12:24 PM  Clinical Narrative:                  Confirmed face sheet information with patient at bedside. Patient from home with son.   Pre arranged HHPT with [facility]. Tiffany with [facility] accepted referral. Expected Discharge Plan: Home w Home Health Services Barriers to Discharge: No Barriers Identified   Patient Goals and CMS Choice Patient states their goals for this hospitalization and ongoing recovery are:: to go home CMS Medicare.gov Compare Post Acute Care list provided to:: Patient Choice offered to / list presented to : Patient  Expected Discharge Plan and Services Expected Discharge Plan: Home w Home Health Services   Discharge Planning Services: CM Consult Post Acute Care Choice: Home Health Living arrangements for the past 2 months: Single Family Home Expected Discharge Date: 01/02/20               DME Arranged: N/A         HH Arranged: PT HH Agency: [facility] (formerly [facility]) Date HH Agency Contacted: 01/02/20 Time HH Agency Contacted: 1223 Representative spoke with at [facility] Agency: Tiffany  Prior Living Arrangements/Services Living arrangements for the past 2 months: Single Family Home Lives with:: Adult Children Patient language and need for interpreter reviewed:: Yes Do you feel safe going back to the place where you live?: Yes      Need for Family Participation in Patient Care: Yes (Comment) Care giver support system in place?: Yes (comment)   Criminal Activity/Legal Involvement Pertinent to Current Situation/Hospitalization: No - Comment as needed  Activities of Daily Living Home Assistive Devices/Equipment: Eyeglasses ADL Screening (condition at time of admission) Patient's cognitive ability  adequate to safely complete daily activities?: Yes Is the patient deaf or have difficulty hearing?: No Does the patient have difficulty seeing, even when wearing glasses/contacts?: No Does the patient have difficulty concentrating, remembering, or making decisions?: Yes Patient able to express need for assistance with ADLs?: Yes Does the patient have difficulty dressing or bathing?: Yes Independently performs ADLs?: Yes (appropriate for developmental age) Does the patient have difficulty walking or climbing stairs?: No Weakness of Legs: None Weakness of Arms/Hands: Left  Permission Sought/Granted   Permission granted to share information with : No              Emotional Assessment Appearance:: Appears stated age Attitude/Demeanor/Rapport: Engaged Affect (typically observed): Accepting Orientation: : Oriented to Self, Oriented to Place, Oriented to  Time, Oriented to Situation Alcohol / Substance Use: Not Applicable Psych Involvement: No (comment)  Admission diagnosis:  Closed fracture of left proximal humerus [S42.202A] Patient Active Problem List   Diagnosis Date Noted  . Closed fracture of left proximal humerus 01/01/2020  . Fracture of base of fifth metacarpal bone of right hand 10/06/2016  . Benzodiazepine dependence, continuous (HCC) 01/09/2016  . History of panic attacks 09/11/2014  . Malaise 09/03/2014  . Fatigue 09/03/2014  . Body mass index (BMI) of 20.0-20.9 in adult 09/03/2014  . Right clavicle fracture 12/12/2013  . Clavicle fracture 11/07/2013   PCP:  Kari Baars, MD Pharmacy:   [phone] - [place], [place] - [address] [place] [address] Phone: [phone] Fax: [phone]  [phone]  [place] [place] - [address] [place] [address] Phone: [phone] Fax: [phone]     Social Determinants of Health (SDOH) Interventions    Readmission  Risk Interventions No flowsheet data found.

## 2020-01-02 NOTE — Evaluation (Signed)
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  She sustained a left proximal humerus fracture several months ago. PMHx: panic attacks, clavicle fx  Clinical Impression  Pt very pleasant and eager to return home. Pt reports functioning since fall with only RUE and sponge bathing and showering a few times a week. Pt reports sufficient family assist for home, no dizziness and moving well. Pt able to perform basic transfers, gait and functional mobility without assist. No further acute P.T. needs with pt agreeable and will sign off.     Follow Up Recommendations No PT follow up    Equipment Recommendations  None recommended by PT    Recommendations for Other Services       Precautions / Restrictions        Mobility  Bed Mobility Overal bed mobility: Modified Independent Bed Mobility: Supine to Sit;Sit to Supine           General bed mobility comments: mod I into and out of bed  Transfers Overall transfer level: Modified independent                  Ambulation/Gait Ambulation/Gait assistance: Independent Gait Distance (Feet): 500 Feet Assistive device: None Gait Pattern/deviations: WFL(Within Functional Limits)   Gait velocity interpretation: >4.37 ft/sec, indicative of normal walking speed    Stairs            Wheelchair Mobility    Modified Rankin (Stroke Patients Only)       Balance Overall balance assessment: No apparent balance deficits (not formally assessed)                                           Pertinent Vitals/Pain Pain Score: 6  Pain Location: L shoulder Pain Descriptors / Indicators: Discomfort;Sore Pain Intervention(s): Limited activity within patient's tolerance;Monitored during  session;Repositioned    Home Living Family/patient expects to be discharged to:: Private residence Living Arrangements: Children Available Help at Discharge: Family;Available 24 hours/day Type of Home: House Home Access: Stairs to enter   [employer] of Steps: 1 Home Layout: One level Home Equipment: None      Prior Function Level of Independence: Independent         Comments: pt was working, driving, and independent with all ADL/IADL      Hand Dominance        Extremity/Trunk Assessment   Upper Extremity Assessment Upper Extremity Assessment: Defer to OT evaluation    Lower Extremity Assessment Lower Extremity Assessment: Overall WFL for tasks assessed    Cervical / Trunk Assessment Cervical / Trunk Assessment: Normal  Communication   Communication: No difficulties  Cognition Arousal/Alertness: Awake/alert Behavior During Therapy: WFL for tasks assessed/performed Overall Cognitive Status: Within Functional Limits for tasks assessed                                        General Comments      Exercises     Assessment/Plan    PT Assessment Patent does not need any further PT services  PT Problem List         PT  Treatment Interventions      PT Goals (Current goals can be found in the Care Plan section)  Acute Rehab PT Goals PT Goal Formulation: All assessment and education complete, DC therapy    Frequency     Barriers to discharge        Co-evaluation               AM-PAC PT "6 Clicks" Mobility  Outcome Measure Help needed turning from your back to your side while in a flat bed without using bedrails?: None Help needed moving from lying on your back to sitting on the side of a flat bed without using bedrails?: None Help needed moving to and from a bed to a chair (including a wheelchair)?: None Help needed standing up from a chair using your arms (e.g., wheelchair or bedside chair)?: None Help needed to  walk in hospital room?: None Help needed climbing 3-5 steps with a railing? : None 6 Click Score: 24    End of Session Equipment Utilized During Treatment: Other (comment)(sling) Activity Tolerance: Patient tolerated treatment well Patient left: in bed;with call bell/phone within reach Nurse Communication: Mobility status PT Visit Diagnosis: Other abnormalities of gait and mobility (R26.89)    Time: 4332-9518 PT Time Calculation (min) (ACUTE ONLY): 14 min   Charges:   PT Evaluation $PT Eval Low Complexity: [place], PT Acute Rehabilitation Services Pager: [phone] Office: [place]  01/02/2020, 8:00 AM

## 2020-01-03 DIAGNOSIS — F329 Major depressive disorder, single episode, unspecified: Secondary | ICD-10-CM | POA: Diagnosis not present

## 2020-01-03 DIAGNOSIS — F410 Panic disorder [episodic paroxysmal anxiety] without agoraphobia: Secondary | ICD-10-CM | POA: Diagnosis not present

## 2020-01-03 DIAGNOSIS — F17210 Nicotine dependence, cigarettes, uncomplicated: Secondary | ICD-10-CM | POA: Diagnosis not present

## 2020-01-03 DIAGNOSIS — G8929 Other chronic pain: Secondary | ICD-10-CM | POA: Diagnosis not present

## 2020-01-03 DIAGNOSIS — S42001D Fracture of unspecified part of right clavicle, subsequent encounter for fracture with routine healing: Secondary | ICD-10-CM | POA: Diagnosis not present

## 2020-01-03 DIAGNOSIS — M549 Dorsalgia, unspecified: Secondary | ICD-10-CM | POA: Diagnosis not present

## 2020-01-03 DIAGNOSIS — S42202P Unspecified fracture of upper end of left humerus, subsequent encounter for fracture with malunion: Secondary | ICD-10-CM | POA: Diagnosis not present

## 2020-01-03 DIAGNOSIS — Z96612 Presence of left artificial shoulder joint: Secondary | ICD-10-CM | POA: Diagnosis not present

## 2020-01-03 DIAGNOSIS — Z9181 History of falling: Secondary | ICD-10-CM | POA: Diagnosis not present

## 2020-01-04 ENCOUNTER — Telehealth: Payer: Self-pay | Admitting: Orthopedic Surgery

## 2020-01-04 NOTE — Telephone Encounter (Signed)
IC LMVM for [place] with verbal orders.

## 2020-01-04 NOTE — Telephone Encounter (Signed)
Michelle/[facility] called and needed verbal orders for:  1x for week 3  Please call @[phone]

## 2020-01-04 NOTE — Discharge Summary (Signed)
Physician Discharge Summary      Patient ID: Casey [name] MRN: [id number] DOB/AGE: [birth year] 61 y.o.  Admit date: 01/01/2020 Discharge date: 01/02/2020  Admission Diagnoses:  Active Problems:   Closed fracture of left proximal humerus   Discharge Diagnoses:  Same  Surgeries: Procedure(s): LEFT REVERSE SHOULDER ARTHROPLASTY on 01/01/2020   Consultants:   Discharged Condition: Stable  Hospital Course: Casey [name] is an 59 y.o. female who was admitted 01/01/2020 with a chief complaint of left shoulder pain, and found to have a diagnosis of left shoulder proximal humerus fracture nonunion.  They were brought to the operating room on 01/01/2020 and underwent the above named procedures.  Pt awoke from anesthesia without complication and was transferred to the floor. On POD1, patient was doing well and pain was well-controlled.  Her motor function was intact.  Pt will f/u with Dr. August Saucer in clinic in ~2 weeks.   Antibiotics given:  Anti-infectives (From admission, onward)   Start     Dose/Rate Route Frequency Ordered Stop   01/01/20 1830  vancomycin (VANCOCIN) IVPB 1000 mg/200 mL premix     1,000 mg 200 mL/hr over 60 Minutes Intravenous Every 12 hours 01/01/20 1346 01/01/20 1909   01/01/20 1041  vancomycin (VANCOCIN) powder  Status:  Discontinued       As needed 01/01/20 1042 01/01/20 1208   01/01/20 0615  vancomycin (VANCOCIN) IVPB 1000 mg/200 mL premix     1,000 mg 200 mL/hr over 60 Minutes Intravenous On call to O.R. 01/01/20 4696 01/01/20 1356    .  Recent vital signs:  Vitals:   01/02/20 0346 01/02/20 0802  BP: (!) 111/58 (!) 114/92  Pulse: 73 85  Resp: 17 17  Temp: 98.1 F (36.7 C) 98.1 F (36.7 C)  SpO2: 96% 97%    Recent laboratory studies:  Results for orders placed or performed during the hospital encounter of 01/01/20  CBC  Result Value Ref Range   WBC 7.4 4.0 - 10.5 K/uL   RBC 4.04 3.87 - 5.11 MIL/uL   Hemoglobin 13.2 12.0 - 15.0 g/dL   HCT 29.5 28.4 -  13.2 %   MCV 100.7 (H) 80.0 - 100.0 fL   MCH 32.7 26.0 - 34.0 pg   MCHC 32.4 30.0 - 36.0 g/dL   RDW 44.0 10.2 - 72.5 %   Platelets 419 (H) 150 - 400 K/uL   nRBC 0.0 0.0 - 0.2 %  Basic metabolic panel  Result Value Ref Range   Sodium 137 135 - 145 mmol/L   Potassium 3.9 3.5 - 5.1 mmol/L   Chloride 105 98 - 111 mmol/L   CO2 20 (L) 22 - 32 mmol/L   Glucose, Bld 107 (H) 70 - 99 mg/dL   BUN 6 6 - 20 mg/dL   Creatinine, Ser 3.66 0.44 - 1.00 mg/dL   Calcium 8.7 (L) 8.9 - 10.3 mg/dL   GFR calc non Af Amer >60 >60 mL/min   GFR calc Af Amer >60 >60 mL/min   Anion gap 12 5 - 15    Discharge Medications:   Allergies as of 01/02/2020      Reactions   Ampicillin Other (See Comments)   Reaction is unknown      Medication List    STOP taking these medications   HYDROcodone-acetaminophen 5-325 MG tablet Commonly known as: NORCO/VICODIN   ibuprofen 600 MG tablet Commonly known as: ADVIL   oxycodone 5 MG capsule Commonly known as: OXY-IR Replaced by: Oxycodone HCl 10 MG  Tabs     TAKE these medications   calcium-vitamin D 500-200 MG-UNIT tablet Commonly known as: OSCAL WITH D Take 1 tablet by mouth daily with breakfast. For bone health   celecoxib 200 MG capsule Commonly known as: CeleBREX Take 1 capsule (200 mg total) by mouth 2 (two) times daily. Additionally, take 400mg  (2 capsules) by mouth the morning of your surgery as soon as you wake up with a small sip of water.   citalopram 20 MG tablet Commonly known as: CELEXA Take 20 mg by mouth daily.   methocarbamol 500 MG tablet Commonly known as: ROBAXIN Take 1 tablet (500 mg total) by mouth every 8 (eight) hours as needed for muscle spasms.   omega-3 acid ethyl esters 1 g capsule Commonly known as: LOVAZA Take 1 capsule (1 g total) by mouth daily before breakfast. For high cholesterol   Oxycodone HCl 10 MG Tabs Take 1 tablet (10 mg total) by mouth every 4 (four) hours as needed for moderate pain (pain score  4-6). Replaces: oxycodone 5 MG capsule   potassium chloride 10 MEQ tablet Commonly known as: KLOR-CON Take 1 tablet (10 mEq total) by mouth daily for 5 days.       Diagnostic Studies: DG Shoulder Left Port  Result Date: 01/01/2020 CLINICAL DATA:  Status post left reverse shoulder arthroplasty. EXAM: LEFT SHOULDER COMPARISON:  November 08, 2019. FINDINGS: The left acetabular and humeral components appear to be well situated. Visualized ribs are unremarkable. IMPRESSION: Status post left total shoulder arthroplasty. Electronically Signed   By: Marijo Conception M.D.   On: 01/01/2020 13:13    Disposition: Discharge disposition: 01-Home or Self Care       Discharge Instructions    Call MD / Call 911   Complete by: As directed    If you experience chest pain or shortness of breath, CALL 911 and be transported to the hospital emergency room.  If you develope a fever above 101 F, pus (white drainage) or increased drainage or redness at the wound, or calf pain, call your surgeon's office.   Constipation Prevention   Complete by: As directed    Drink plenty of fluids.  Prune juice may be helpful.  You may use a stool softener, such as Colace (over the counter) 100 mg twice a day.  Use MiraLax (over the counter) for constipation as needed.   Diet - low sodium heart healthy   Complete by: As directed    Discharge instructions   Complete by: As directed    You may shower, dressing is waterproof.  Do not bathe or soak the operative shoulder in a tub, pool.  Use the CPM machine 3 times a day for one hour each time.  No lifting with the operative shoulder. Continue use of the sling.  Follow-up with Dr. Marlou Sa in ~2 weeks on your given appointment date.  We will remove your adhesive bandage at that time.  Call the office with any questions or concerns.   Increase activity slowly as tolerated   Complete by: As directed       Follow-up Information    [facility] up.   Specialty: Home  Health Services Why: home health PT  Contact information: [address] [place] [address] [phone]            Signed: Donella Stade 01/04/2020, 4:04 PM

## 2020-01-05 ENCOUNTER — Telehealth: Payer: Self-pay | Admitting: Orthopedic Surgery

## 2020-01-05 NOTE — Telephone Encounter (Signed)
Pt called in stating she just had surgery on 01-01-20 and the pain medication she was prescribed is not strong enough; and she would like something stronger [facility] on [address].   [phone]

## 2020-01-05 NOTE — Telephone Encounter (Signed)
Please advise. Thanks.  

## 2020-01-05 NOTE — Telephone Encounter (Signed)
I called s/w patient and advised. She verbalized understanding.

## 2020-01-06 ENCOUNTER — Other Ambulatory Visit: Payer: Self-pay | Admitting: Surgical

## 2020-01-08 ENCOUNTER — Telehealth: Payer: Self-pay | Admitting: Orthopedic Surgery

## 2020-01-08 ENCOUNTER — Other Ambulatory Visit: Payer: Self-pay | Admitting: Surgical

## 2020-01-08 MED ORDER — OXYCODONE HCL 10 MG PO TABS: 10.0000 mg | ORAL_TABLET | ORAL | 0 refills | Status: DC | PRN

## 2020-01-08 NOTE — Telephone Encounter (Signed)
Patient called.   She is requesting a refill on her OxyCODONE.   Call back number: [phone]

## 2020-01-08 NOTE — Telephone Encounter (Signed)
Please advise. Thanks.  

## 2020-01-12 ENCOUNTER — Telehealth: Payer: Self-pay | Admitting: Orthopedic Surgery

## 2020-01-12 NOTE — Telephone Encounter (Signed)
Marcelino Duster with [facility] called in wanting to let dr.dean know that they have been unable to see pt since 01-05-20 due to various reasons. Any questions you can give her a call.   [phone]

## 2020-01-15 ENCOUNTER — Other Ambulatory Visit: Payer: Self-pay

## 2020-01-15 ENCOUNTER — Encounter: Payer: Self-pay | Admitting: Orthopedic Surgery

## 2020-01-15 ENCOUNTER — Ambulatory Visit (INDEPENDENT_AMBULATORY_CARE_PROVIDER_SITE_OTHER): Payer: BC Managed Care – PPO | Admitting: Orthopedic Surgery

## 2020-01-15 ENCOUNTER — Ambulatory Visit (INDEPENDENT_AMBULATORY_CARE_PROVIDER_SITE_OTHER): Payer: BC Managed Care – PPO

## 2020-01-15 DIAGNOSIS — Z96612 Presence of left artificial shoulder joint: Secondary | ICD-10-CM

## 2020-01-15 MED ORDER — OXYCODONE HCL 10 MG PO TABS: 10.0000 mg | ORAL_TABLET | ORAL | 0 refills | Status: DC | PRN

## 2020-01-15 MED ORDER — ONDANSETRON HCL 4 MG PO TABS: 4.0000 mg | ORAL_TABLET | Freq: Three times a day (TID) | ORAL | 0 refills | Status: DC | PRN

## 2020-01-15 NOTE — Telephone Encounter (Signed)
FYI

## 2020-01-17 DIAGNOSIS — M549 Dorsalgia, unspecified: Secondary | ICD-10-CM | POA: Diagnosis not present

## 2020-01-17 DIAGNOSIS — F410 Panic disorder [episodic paroxysmal anxiety] without agoraphobia: Secondary | ICD-10-CM | POA: Diagnosis not present

## 2020-01-17 DIAGNOSIS — S42202P Unspecified fracture of upper end of left humerus, subsequent encounter for fracture with malunion: Secondary | ICD-10-CM | POA: Diagnosis not present

## 2020-01-17 DIAGNOSIS — F329 Major depressive disorder, single episode, unspecified: Secondary | ICD-10-CM | POA: Diagnosis not present

## 2020-01-17 DIAGNOSIS — Z96612 Presence of left artificial shoulder joint: Secondary | ICD-10-CM | POA: Diagnosis not present

## 2020-01-17 DIAGNOSIS — F17210 Nicotine dependence, cigarettes, uncomplicated: Secondary | ICD-10-CM | POA: Diagnosis not present

## 2020-01-17 DIAGNOSIS — S42001D Fracture of unspecified part of right clavicle, subsequent encounter for fracture with routine healing: Secondary | ICD-10-CM | POA: Diagnosis not present

## 2020-01-17 DIAGNOSIS — G8929 Other chronic pain: Secondary | ICD-10-CM | POA: Diagnosis not present

## 2020-01-17 DIAGNOSIS — Z9181 History of falling: Secondary | ICD-10-CM | POA: Diagnosis not present

## 2020-01-19 ENCOUNTER — Encounter: Payer: Self-pay | Admitting: Orthopedic Surgery

## 2020-01-19 NOTE — Progress Notes (Signed)
Post-Op Visit Note   Patient: Casey [name]           Date of Birth: 1961-07-30           MRN: [id number] Visit Date: 01/15/2020 PCP: Sinda Du, MD   Assessment & Plan:  Chief Complaint:  Chief Complaint  Patient presents with  . Left Shoulder - Routine Post Op   Visit Diagnoses:  1. Status post reverse total replacement of left shoulder     Plan: Patient is a 59 year old female who presents s/p left reverse shoulder arthroplasty on 01/01/2020.  Patient notes that she is doing okay but does complain of a lot of pain.  She has been taking oxycodone 10 mg for pain but this is causing her to be nauseous and vomit at times.  She is using CPM machine as directed is up to 47 degrees unchanged.,  Physical therapy has been going well.  Plan to refill the oxycodone as well as prescribe Zofran for patient's nausea/vomiting.  Patient does have some stiffness on exam with 50 degrees of forward flexion 45 degrees of abduction and 10 degrees of external rotation.  This is understandable as she is stiff leaving after surgery due to the fracture.  Her axillary nerve is still functioning and her deltoid fires.  Her subscapularis has good strength as well.  Plan to start patient outpatient physical therapy where they will do manual physical therapy for range of motion she will work on passive range of motion and active assisted range of motion.  She will go to diagnosis for outpatient physical therapy location as well.  Radiographs taken today reveal a well aligned prosthesis with no complicating features she will discontinue the sling.  No lifting greater than 5 pounds.  Follow-up in 4 weeks clinical check  Follow-Up Instructions: No follow-ups on file.   Orders:  Orders Placed This Encounter  Procedures  . XR Shoulder Left  . Ambulatory referral to Occupational Therapy   Meds ordered this encounter  Medications  . Oxycodone HCl 10 MG TABS    Sig: Take 1 tablet (10 mg total) by mouth every 4  (four) hours as needed (pain score 4-6).    Dispense:  35 tablet    Refill:  0  . ondansetron (ZOFRAN) 4 MG tablet    Sig: Take 1 tablet (4 mg total) by mouth every 8 (eight) hours as needed for nausea or vomiting.    Dispense:  20 tablet    Refill:  0    Imaging: No results found.  PMFS History: Patient Active Problem List   Diagnosis Date Noted  . Closed fracture of left proximal humerus 01/01/2020  . Fracture of base of fifth metacarpal bone of right hand 10/06/2016  . Benzodiazepine dependence, continuous ([place]) 01/09/2016  . History of panic attacks 09/11/2014  . Malaise 09/03/2014  . Fatigue 09/03/2014  . Body mass index (BMI) of 20.0-20.9 in adult 09/03/2014  . Right clavicle fracture 12/12/2013  . Clavicle fracture 11/07/2013   Past Medical History:  Diagnosis Date  . Anxiety   . Chronic back pain   . Depression   . History of panic attacks 09/11/2014  . Medical history non-contributory   . Ruptured disc, thoracic   . Suicide attempt ([place]) 2002    Family History  Problem Relation Age of Onset  . Diabetes Mother   . Pulmonary fibrosis Mother   . Heart attack Father   . Parkinson's disease Sister   . Bipolar disorder Sister   .  Thyroid disease Sister   . Heart attack Brother   . Other Son        vascular neucrosis  . Pulmonary fibrosis Maternal Grandmother   . Diabetes Maternal Grandfather   . Stroke Maternal Grandfather   . Pneumonia Paternal Grandfather   . Cancer Sister        melonoma  . Other Brother        back problems, nerve problems, soft bones    Past Surgical History:  Procedure Laterality Date  . CESAREAN SECTION    . CLAVICLE SURGERY    . REVERSE SHOULDER ARTHROPLASTY Left 01/01/2020   Procedure: LEFT REVERSE SHOULDER ARTHROPLASTY;  Surgeon: Cammy Copa, MD;  Location: [facility] OR;  Service: Orthopedics;  Laterality: Left;  . TUBAL LIGATION     Social History   Occupational History  . Not on file  Tobacco Use  . Smoking status:  Current Every Day Smoker    Packs/day: 0.25    Types: Cigarettes  . Smokeless tobacco: Never Used  . Tobacco comment: 2-3 cigarettes per day  Substance and Sexual Activity  . Alcohol use: Yes    Alcohol/week: 1.0 standard drinks    Types: 1 Cans of beer per week    Comment: occ   . Drug use: Never    Types: "Crack" cocaine, Benzodiazepines  . Sexual activity: Not Currently    Birth control/protection: Post-menopausal

## 2020-01-22 ENCOUNTER — Telehealth: Payer: Self-pay | Admitting: Orthopedic Surgery

## 2020-01-22 ENCOUNTER — Other Ambulatory Visit: Payer: Self-pay | Admitting: Surgical

## 2020-01-22 MED ORDER — OXYCODONE HCL 10 MG PO TABS: 10.0000 mg | ORAL_TABLET | Freq: Four times a day (QID) | ORAL | 0 refills | Status: DC | PRN

## 2020-01-22 NOTE — Telephone Encounter (Signed)
submitted

## 2020-01-22 NOTE — Telephone Encounter (Signed)
Patient called. She would like a refill on oxycodone. Her call back number is [phone]

## 2020-01-22 NOTE — Telephone Encounter (Signed)
Patient said she is going to therapy tomorrow at 9:45am and will need the pain medication. The number to contact patient is [phone]

## 2020-01-22 NOTE — Telephone Encounter (Signed)
Please advise 

## 2020-01-22 NOTE — Telephone Encounter (Signed)
Pls advise. Thanks.  

## 2020-01-23 ENCOUNTER — Encounter (HOSPITAL_COMMUNITY): Payer: Self-pay

## 2020-01-23 ENCOUNTER — Ambulatory Visit (HOSPITAL_COMMUNITY): Payer: BC Managed Care – PPO | Attending: Orthopedic Surgery

## 2020-01-23 ENCOUNTER — Other Ambulatory Visit: Payer: Self-pay

## 2020-01-23 DIAGNOSIS — R29898 Other symptoms and signs involving the musculoskeletal system: Secondary | ICD-10-CM | POA: Insufficient documentation

## 2020-01-23 DIAGNOSIS — M25612 Stiffness of left shoulder, not elsewhere classified: Secondary | ICD-10-CM | POA: Insufficient documentation

## 2020-01-23 DIAGNOSIS — M25512 Pain in left shoulder: Secondary | ICD-10-CM | POA: Diagnosis not present

## 2020-01-23 NOTE — Therapy (Signed)
[facility] [address] [place], [place], [address] Phone: [phone]   Fax:  [phone]  Occupational Therapy Evaluation  Patient Details  Name: Casey [name] MRN: [id number] Date of Birth: [birth year] Referring Provider (OT): Rise Paganini, MD   Encounter Date: 01/23/2020  OT End of Session - 01/23/20 1211    Visit Number  1    Number of Visits  24    Date for OT Re-Evaluation  04/16/20   mini reassess: 02/20/20   Authorization Type  [employer] commercial PPO    Authorization Time Period  Authorization (02/08/19-02/07/20) 30 visits PT/OT/Chiro combined. 2 used    Authorization - Visit Number  1    Authorization - Number of Visits  28    OT Start Time  [id number]    OT Stop Time  1015    OT Time Calculation (min)  34 min    Activity Tolerance  Patient tolerated treatment well;Patient limited by pain    Behavior During Therapy  WFL for tasks assessed/performed       Past Medical History:  Diagnosis Date  . Anxiety   . Chronic back pain   . Depression   . History of panic attacks 09/11/2014  . Medical history non-contributory   . Ruptured disc, thoracic   . Suicide attempt (HCC) 2002    Past Surgical History:  Procedure Laterality Date  . CESAREAN SECTION    . CLAVICLE SURGERY    . REVERSE SHOULDER ARTHROPLASTY Left 01/01/2020   Procedure: LEFT REVERSE SHOULDER ARTHROPLASTY;  Surgeon: Cammy Copa, MD;  Location: [facility] OR;  Service: Orthopedics;  Laterality: Left;  . TUBAL LIGATION      There were no vitals filed for this visit.  Subjective Assessment - 01/23/20 0945    Subjective   S: It hurts whenever it moves.    Pertinent History  Patient is a 59 y/o female S/P left total reverse shoulder replacement which was completed on 01/01/20 after patient sustained a fall December 2020. Patient is currently out of the sling. Dr. August Saucer has referred patient to occupational therapy for evaluation and treatment.    Patient Stated Goals  To be  able to use her arm as normally as possible and return to work if able.    Currently in Pain?  Yes    Pain Score  5     Pain Location  Shoulder    Pain Orientation  Left    Pain Descriptors / Indicators  Tightness;Constant;Contraction    Pain Type  Acute pain    Pain Radiating Towards  Stays in the upper arm    Pain Onset  More than a month ago    Pain Frequency  Constant    Aggravating Factors   movement    Pain Relieving Factors  pain medication    Effect of Pain on Daily Activities  Paitent is unable to complete any daily tasks with LUE.    Multiple Pain Sites  No        [facility] OT Assessment - 01/23/20 0947      Assessment   Medical Diagnosis  S/P reverse total replacement of L shoulder    Referring Provider (OT)  Rise Paganini, MD    Onset Date/Surgical Date  01/01/20    Hand Dominance  Right    Next MD Visit  03/07/20      Precautions   Precautions  Shoulder    Type of Shoulder Precautions  P/ROM and AA/ROM  Restrictions   Weight Bearing Restrictions  Yes      Balance Screen   Has the patient fallen in the past 6 months  Yes    How many times?  1    Has the patient had a decrease in activity level because of a fear of falling?   No    Is the patient reluctant to leave their home because of a fear of falling?   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 Lots of reaching overhead and down below. Max 8-10 lbs weight lifting.       ADL   ADL comments  Patient is unable to complete any daily task with the left arm.       Mobility   Mobility Status  Independent      Written Expression   Dominant Hand  Right      Vision - History    Baseline Vision  Wears glasses only for reading      Cognition   Overall Cognitive Status  Within Functional Limits for tasks assessed      Observation/Other Assessments   Focus on Therapeutic Outcomes (FOTO)   Complete next session      Posture/Postural Control   Posture/Postural Control  Postural limitations    Postural Limitations  Rounded Shoulders;Forward head      ROM / Strength   AROM / PROM / Strength  AROM;PROM;Strength      Palpation   Palpation comment  Max fascial restrictions in left upper arm, trapezius, and scapularis region.       AROM   Overall AROM   Unable to assess;Due to pain;Due to precautions      PROM   Overall PROM Comments  Assessed supine. IR/er adducted. Towel under LUE for comfort.    PROM Assessment Site  Shoulder    Right/Left Shoulder  Left    Left Shoulder Flexion  110 Degrees    Left Shoulder ABduction  65 Degrees    Left Shoulder Internal Rotation  90 Degrees    Left Shoulder External Rotation  6 Degrees      Strength   Overall Strength  Unable to assess;Due to precautions                      OT Education - 01/23/20 1210    Education Details  Table slides, A/ROM elbow flexion/extension, seated scapular A/ROM. Scar massage  and use of Vitamin E oil (verbal education)    Person(s) Educated  Patient    Methods  Explanation;Demonstration;Verbal cues;Handout    Comprehension  Verbalized understanding       OT Short Term Goals - 01/23/20 1231      OT SHORT TERM GOAL #1   Title  Pt will be educated on and independent  in HEP to improve mobility required in LUE for ADL completion.     Time  6    Period  Weeks    Status  New    Target Date  03/05/20      OT SHORT TERM GOAL #2  Title  Patient will decrease fascial restrictions in the LUE to moderate amount in order to increase her functional mobility needed to complete low level reaching tasks below shoulder level.    Time  6    Period  Weeks    Status  New      OT  SHORT TERM GOAL #3   Title  Patient will report a decrease in pain in the LUE of approximately 4/10 during basic daily use.    Time  6    Period  Weeks    Status  New      OT SHORT TERM GOAL #4   Title  Patient will increase LUE P/ROM to [facility] in order to increase ability to complete upper body dressing with less difficulty.    Time  6    Period  Weeks    Status  New      OT SHORT TERM GOAL #5   Title  Patient will increase LUE shoulder strength to 3/5 in order to progress to completing reaching tasks above the shoulder.    Time  6    Period  Weeks    Status  New        OT Long Term Goals - 01/23/20 1233      OT LONG TERM GOAL #1   Title  Patient will return to highest level of independence while using her LUE for all daily tasks approximately 75% of the time or more.    Time  12    Period  Weeks    Status  New    Target Date  04/16/20      OT LONG TERM GOAL #2   Title  Patient will increase her LUE A/ROM to [facility] in order to complete required work tasks such as reaching overhead and down below.    Time  12    Period  Weeks    Status  New      OT LONG TERM GOAL #3   Title  Patient will increase LUE strength to 4+/5 in order to complete lightweight household lifting tasks with less difficulty.    Time  12    Period  Weeks    Status  New      OT LONG TERM GOAL #4   Title  Patient will decrease LUE fascial restrictions to min amount or less in order to increase her functional mobility needed to complete reaching tasks.    Time  12    Period  Weeks    Status  New      OT LONG TERM GOAL #5   Title  Patient will report a decrease in pain level of approximately 3/10 or less in the her LUE while completing daily tasks.    Time  12    Period  Weeks    Status  New            Plan - 01/23/20 1212    Clinical Impression Statement  A: Patient is a 59 y/o female S/P left reverse total shoulder replacement causing increased pain, fascial restrictions, and decreased ROM and  strength resulting in difficulty completing daily tasks using her LUE.    OT Occupational Profile and History  Problem Focused Assessment - Including review of records relating to presenting problem    Occupational performance deficits (Please refer to evaluation for details):  ADL's;IADL's;Rest and Sleep;Work;Leisure    Body Structure / Function / Physical Skills  ADL;UE functional use;Fascial restriction;Pain;Flexibility;ROM;IADL;Strength;Mobility    Rehab  Potential  Excellent    Clinical Decision Making  Several treatment options, min-mod task modification necessary    Comorbidities Affecting Occupational Performance:  May have comorbidities impacting occupational performance    Modification or Assistance to Complete Evaluation   Min-Moderate modification of tasks or assist with assess necessary to complete eval    OT Frequency  2x / week    OT Duration  12 weeks    OT Treatment/Interventions  Self-care/ADL training;Ultrasound;Patient/family education;Scar mobilization;DME and/or AE instruction;Passive range of motion;Cryotherapy;Electrical Stimulation;Moist Heat;Neuromuscular education;Therapeutic activities;Manual Therapy;Therapeutic exercise    Plan  P: Patient will benefit from skilled OT services to increase functional performance and allow her to return to using her LUE during all daily and work related tasks. NEXT SESSION: Complete FOTO. Treatment plan: Myofascial release, manual stretching, P/ROM, AA/ROM, progress to A/ROM and general strengthening when MD clears it. Modalities PRN.    OT Home Exercise Plan  Eval: table slides, elbow A/ROM exercises, Scapular A/ROM    Consulted and Agree with Plan of Care  Patient       Patient will benefit from skilled therapeutic intervention in order to improve the following deficits and impairments:   Body Structure / Function / Physical Skills: ADL, UE functional use, Fascial restriction, Pain, Flexibility, ROM, IADL, Strength, Mobility        Visit Diagnosis: Other symptoms and signs involving the musculoskeletal system - Plan: Ot plan of care cert/re-cert  Stiffness of left shoulder, not elsewhere classified - Plan: Ot plan of care cert/re-cert  Acute pain of left shoulder - Plan: Ot plan of care cert/re-cert    Problem List Patient Active Problem List   Diagnosis Date Noted  . Closed fracture of left proximal humerus 01/01/2020  . Fracture of base of fifth metacarpal bone of right hand 10/06/2016  . Benzodiazepine dependence, continuous ([place]) 01/09/2016  . History of panic attacks 09/11/2014  . Malaise 09/03/2014  . Fatigue 09/03/2014  . Body mass index (BMI) of 20.0-20.9 in adult 09/03/2014  . Right clavicle fracture 12/12/2013  . Clavicle fracture 11/07/2013   Ailene Ravel, OTR/L,CBIS  [phone]  01/23/2020, 12:42 PM  [facility] [address] [place], [place], [address] Phone: [phone]   Fax:  [phone]  Name: KAZOUA GOSSEN MRN: [id number] Date of Birth: 01/14/1961

## 2020-01-23 NOTE — Patient Instructions (Signed)
TOWEL SLIDES COMPLETE FOR 1-3 MINUTES, 3-5 TIMES PER DAY  SHOULDER: Flexion On Table   Place hands on table, elbows straight. Move hips away from body. Press hands down into table. Hold ___ seconds. ___ reps per set, ___ sets per day, ___ days per week  Abduction (Passive)   With arm out to side, resting on table, lower head toward arm, keeping trunk away from table. Hold ____ seconds. Repeat ____ times. Do ____ sessions per day.  Copyright  VHI. All rights reserved.     Internal Rotation (Assistive)   Seated with elbow bent at right angle and held against side, slide arm on table surface in an inward arc. Repeat ____ times. Do ____ sessions per day. Activity: Use this motion to brush crumbs off the table.    Marland Kitchen1) Seated Row   Sit up straight with elbows by your sides. Pull back with shoulders/elbows, keeping forearms straight, as if pulling back on the reins of a horse. Squeeze shoulder blades together. Repeat _10-15__times, __2-3__sets/day     2) Shoulder Extension    Sit up straight with both arms by your side, draw your arms back behind your waist. Keep your elbows straight. Repeat __10-15__times, __2-3__sets/day.   ELBOW FLEXION EXTENSION  Start with your arm at your side. Bend at your elbow to raise your forearm/hand upwards as shown. Then return to starting position and repeat. 10 times.

## 2020-01-29 ENCOUNTER — Telehealth: Payer: Self-pay | Admitting: Orthopedic Surgery

## 2020-01-29 ENCOUNTER — Telehealth (HOSPITAL_COMMUNITY): Payer: Self-pay

## 2020-01-29 ENCOUNTER — Other Ambulatory Visit: Payer: Self-pay | Admitting: Surgical

## 2020-01-29 MED ORDER — OXYCODONE HCL 10 MG PO TABS: 10.0000 mg | ORAL_TABLET | Freq: Three times a day (TID) | ORAL | 0 refills | Status: DC | PRN

## 2020-01-29 NOTE — Telephone Encounter (Signed)
pt cancelled appt for tomorrow because she does not have a ride

## 2020-01-29 NOTE — Telephone Encounter (Signed)
Patient called requesting a refill of oxycodone. Please send to pharmacy on file. Patient phone number is [phone].

## 2020-01-29 NOTE — Telephone Encounter (Signed)
Pls advise thanks.  

## 2020-01-30 ENCOUNTER — Ambulatory Visit (HOSPITAL_COMMUNITY): Payer: BC Managed Care – PPO

## 2020-02-01 ENCOUNTER — Ambulatory Visit (HOSPITAL_COMMUNITY): Payer: BC Managed Care – PPO | Admitting: Specialist

## 2020-02-01 ENCOUNTER — Telehealth (HOSPITAL_COMMUNITY): Payer: Self-pay | Admitting: Specialist

## 2020-02-01 NOTE — Telephone Encounter (Signed)
Pt cx on phone tree - called to confirm cx'lation. [facility] and the pt could not make it today- she has to pay each time she comes

## 2020-02-05 ENCOUNTER — Ambulatory Visit: Payer: BC Managed Care – PPO | Admitting: Orthopedic Surgery

## 2020-02-05 ENCOUNTER — Telehealth: Payer: Self-pay | Admitting: Orthopedic Surgery

## 2020-02-05 ENCOUNTER — Other Ambulatory Visit: Payer: Self-pay | Admitting: Surgical

## 2020-02-05 MED ORDER — OXYCODONE HCL 10 MG PO TABS: 10.0000 mg | ORAL_TABLET | Freq: Two times a day (BID) | ORAL | 0 refills | Status: DC | PRN

## 2020-02-05 NOTE — Telephone Encounter (Signed)
Pls advise. Thanks.  

## 2020-02-05 NOTE — Telephone Encounter (Signed)
Patient called. She would like oxycodone called in for her pain. Her call back number is [phone]

## 2020-02-06 ENCOUNTER — Ambulatory Visit (HOSPITAL_COMMUNITY): Payer: BC Managed Care – PPO

## 2020-02-08 ENCOUNTER — Other Ambulatory Visit: Payer: Self-pay

## 2020-02-08 ENCOUNTER — Ambulatory Visit (HOSPITAL_COMMUNITY): Payer: BC Managed Care – PPO | Attending: Orthopedic Surgery

## 2020-02-08 ENCOUNTER — Encounter (HOSPITAL_COMMUNITY): Payer: Self-pay

## 2020-02-08 DIAGNOSIS — R29898 Other symptoms and signs involving the musculoskeletal system: Secondary | ICD-10-CM | POA: Insufficient documentation

## 2020-02-08 DIAGNOSIS — M25512 Pain in left shoulder: Secondary | ICD-10-CM

## 2020-02-08 DIAGNOSIS — M25612 Stiffness of left shoulder, not elsewhere classified: Secondary | ICD-10-CM | POA: Diagnosis not present

## 2020-02-08 NOTE — Therapy (Signed)
[facility] [address] [place], [place], [address] Phone: [phone]   Fax:  [phone]  Occupational Therapy Treatment  Patient Details  Name: Casey [name] MRN: [id number] Date of Birth: May 20, 1961 Referring Provider (OT): Rise Paganini, MD   Encounter Date: 02/08/2020  OT End of Session - 02/08/20 1022    Visit Number  2    Number of Visits  24    Date for OT Re-Evaluation  04/16/20   mini reassess: 02/20/20   Authorization Type  [employer]BS commercial PPO    Authorization Time Period  Authorization (02/08/19-02/07/20) 30 visits PT/OT/Chiro combined. 2 used    Authorization - Visit Number  2    Authorization - Number of Visits  28    OT Start Time  0945    OT Stop Time  1030    OT Time Calculation (min)  45 min    Activity Tolerance  Patient tolerated treatment well;Patient limited by pain    Behavior During Therapy  WFL for tasks assessed/performed       Past Medical History:  Diagnosis Date  . Anxiety   . Chronic back pain   . Depression   . History of panic attacks 09/11/2014  . Medical history non-contributory   . Ruptured disc, thoracic   . Suicide attempt (HCC) 2002    Past Surgical History:  Procedure Laterality Date  . CESAREAN SECTION    . CLAVICLE SURGERY    . REVERSE SHOULDER ARTHROPLASTY Left 01/01/2020   Procedure: LEFT REVERSE SHOULDER ARTHROPLASTY;  Surgeon: Cammy Copa, MD;  Location: [facility] OR;  Service: Orthopedics;  Laterality: Left;  . TUBAL LIGATION      There were no vitals filed for this visit.  Subjective Assessment - 02/08/20 0952    Currently in Pain?  Yes    Pain Score  5     Pain Location  Shoulder    Pain Orientation  Left    Pain Descriptors / Indicators  Sore    Pain Type  Acute pain    Pain Radiating Towards  N/A    Pain Onset  More than a month ago    Pain Frequency  Occasional    Aggravating Factors   certain movements    Pain Relieving Factors  ice and over the counter pain  medication    Effect of Pain on Daily Activities  min effect    Multiple Pain Sites  No         [facility] OT Assessment - 02/08/20 0954      Assessment   Medical Diagnosis  S/P reverse total replacement of L shoulder      Precautions   Precautions  Shoulder    Type of Shoulder Precautions  P/ROM and AA/ROM      ROM / Strength   AROM / PROM / Strength  AROM;PROM;Strength      Palpation   Palpation comment  mod fascial restrictions in left       AROM   Overall AROM Comments  assessed supine. IR/er adducted. A/ROM not assessed prior to this date.     AROM Assessment Site  Shoulder    Right/Left Shoulder  Left    Left Shoulder Flexion  112 Degrees    Left Shoulder ABduction  90 Degrees    Left Shoulder Internal Rotation  90 Degrees    Left Shoulder External Rotation  10 Degrees      PROM   Overall PROM Comments  Assessed  supine. IR/er adducted. Towel under LUE for comfort.    PROM Assessment Site  Shoulder    Right/Left Shoulder  Left    Left Shoulder Flexion  127 Degrees   previous: 110   Left Shoulder ABduction  93 Degrees   previous: 65   Left Shoulder Internal Rotation  90 Degrees   previous: same   Left Shoulder External Rotation  25 Degrees   previous: 6              OT Treatments/Exercises (OP) - 02/08/20 1019      Exercises   Exercises  Shoulder      Shoulder Exercises: Supine   Protraction  PROM;5 reps;AAROM;10 reps    Horizontal ABduction  PROM;5 reps;AAROM;10 reps    External Rotation  PROM;5 reps;AAROM;10 reps    Internal Rotation  PROM;5 reps;AAROM;10 reps    Flexion  PROM;5 reps;AAROM;10 reps    ABduction  PROM;5 reps;AAROM;10 reps      Manual Therapy   Manual Therapy  Myofascial release;Muscle Energy Technique    Manual therapy comments  Manual therapy completed prior to exercises.     Myofascial Release  Myofascial release and manual stretching completed to left upper arm, trapezius, and scapularis region to decrease fascial restrictions  and increase joint mobility in a pain free zone.     Muscle Energy Technique  Muscle energy technique completed to left shoulder abductors to decrease tone and and improve range of motion.              OT Education - 02/08/20 1021    Education Details  AA/ROM shoulder exercises - supine    Person(s) Educated  Patient    Methods  Explanation;Demonstration;Verbal cues;Handout    Comprehension  Verbalized understanding;Returned demonstration       OT Short Term Goals - 02/08/20 1021      OT SHORT TERM GOAL #1   Title  Pt will be educated on and independent  in HEP to improve mobility required in LUE for ADL completion.     Time  6    Period  Weeks    Status  On-going    Target Date  03/05/20      OT SHORT TERM GOAL #2   Title  Patient will decrease fascial restrictions in the LUE to moderate amount in order to increase her functional mobility needed to complete low level reaching tasks below shoulder level.    Time  6    Period  Weeks    Status  On-going      OT SHORT TERM GOAL #3   Title  Patient will report a decrease in pain in the LUE of approximately 4/10 during basic daily use.    Time  6    Period  Weeks    Status  On-going      OT SHORT TERM GOAL #4   Title  Patient will increase LUE P/ROM to [facility] in order to increase ability to complete upper body dressing with less difficulty.    Time  6    Period  Weeks    Status  On-going      OT SHORT TERM GOAL #5   Title  Patient will increase LUE shoulder strength to 3/5 in order to progress to completing reaching tasks above the shoulder.    Time  6    Period  Weeks    Status  On-going        OT Long Term Goals - 02/08/20 1022  OT LONG TERM GOAL #1   Title  Patient will return to highest level of independence while using her LUE for all daily tasks approximately 75% of the time or more.    Time  12    Period  Weeks    Status  On-going      OT LONG TERM GOAL #2   Title  Patient will increase her LUE A/ROM  to [facility] in order to complete required work tasks such as reaching overhead and down below.    Time  12    Period  Weeks    Status  On-going      OT LONG TERM GOAL #3   Title  Patient will increase LUE strength to 4+/5 in order to complete lightweight household lifting tasks with less difficulty.    Time  12    Period  Weeks    Status  On-going      OT LONG TERM GOAL #4   Title  Patient will decrease LUE fascial restrictions to min amount or less in order to increase her functional mobility needed to complete reaching tasks.    Time  12    Period  Weeks    Status  On-going      OT LONG TERM GOAL #5   Title  Patient will report a decrease in pain level of approximately 3/10 or less in the her LUE while completing daily tasks.    Time  12    Period  Weeks    Status  On-going            Plan - 02/08/20 1032    Clinical Impression Statement  A: Patient has not been to therapy since evaluation due to transportation issues. Updated measurements taken this session for follow up MD appointment on Monday. Patient has made improvements with Active and Passive ROM regardless of lack of attendance. Progressed to AA/ROM supine to work on increasing ROM as patient continues to display muscle guarding, muscle tightness, and increased pain during passive stretching. Required VC for form and technique as well as pain managment techniques. Education provided for typical course of recovery after surgery and what progress is expected.    Body Structure / Function / Physical Skills  ADL;UE functional use;Fascial restriction;Pain;Flexibility;ROM;IADL;Strength;Mobility    Plan  P: Follow up on MD appointment. Continue to work on increasing ROM as patient is able to tolerate. Attempt AA/ROM standing.    Consulted and Agree with Plan of Care  Patient       Patient will benefit from skilled therapeutic intervention in order to improve the following deficits and impairments:   Body Structure / Function /  Physical Skills: ADL, UE functional use, Fascial restriction, Pain, Flexibility, ROM, IADL, Strength, Mobility       Visit Diagnosis: Stiffness of left shoulder, not elsewhere classified  Acute pain of left shoulder  Other symptoms and signs involving the musculoskeletal system    Problem List Patient Active Problem List   Diagnosis Date Noted  . Closed fracture of left proximal humerus 01/01/2020  . Fracture of base of fifth metacarpal bone of right hand 10/06/2016  . Benzodiazepine dependence, continuous (HCC) 01/09/2016  . History of panic attacks 09/11/2014  . Malaise 09/03/2014  . Fatigue 09/03/2014  . Body mass index (BMI) of 20.0-20.9 in adult 09/03/2014  . Right clavicle fracture 12/12/2013  . Clavicle fracture 11/07/2013   Limmie Patricia, OTR/L,CBIS  [phone]  02/08/2020, 11:05 AM  [facility]  [address] [place], [place], [address] Phone: [phone]   Fax:  [phone]  Name: Casey [name] MRN: [id number] Date of Birth: July 01, 1961

## 2020-02-08 NOTE — Patient Instructions (Signed)

## 2020-02-12 ENCOUNTER — Ambulatory Visit: Payer: BC Managed Care – PPO | Admitting: Orthopedic Surgery

## 2020-02-13 ENCOUNTER — Ambulatory Visit (HOSPITAL_COMMUNITY): Payer: BC Managed Care – PPO

## 2020-02-13 ENCOUNTER — Telehealth: Payer: Self-pay | Admitting: Orthopedic Surgery

## 2020-02-13 ENCOUNTER — Other Ambulatory Visit: Payer: Self-pay

## 2020-02-13 ENCOUNTER — Other Ambulatory Visit: Payer: Self-pay | Admitting: Surgical

## 2020-02-13 ENCOUNTER — Encounter (HOSPITAL_COMMUNITY): Payer: Self-pay

## 2020-02-13 DIAGNOSIS — R29898 Other symptoms and signs involving the musculoskeletal system: Secondary | ICD-10-CM

## 2020-02-13 DIAGNOSIS — M25512 Pain in left shoulder: Secondary | ICD-10-CM | POA: Diagnosis not present

## 2020-02-13 DIAGNOSIS — M25612 Stiffness of left shoulder, not elsewhere classified: Secondary | ICD-10-CM | POA: Diagnosis not present

## 2020-02-13 MED ORDER — OXYCODONE HCL 10 MG PO TABS: 10.0000 mg | ORAL_TABLET | Freq: Two times a day (BID) | ORAL | 0 refills | Status: DC | PRN
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
## 2020-02-13 NOTE — Telephone Encounter (Signed)
Please advise 
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
## 2020-02-15 ENCOUNTER — Ambulatory Visit (HOSPITAL_COMMUNITY): Payer: BC Managed Care – PPO

## 2020-02-20 ENCOUNTER — Ambulatory Visit (HOSPITAL_COMMUNITY): Payer: BC Managed Care – PPO

## 2020-02-20 ENCOUNTER — Telehealth (HOSPITAL_COMMUNITY): Payer: Self-pay

## 2020-02-20 NOTE — Telephone Encounter (Signed)
pt called to cx today's visit due to she has diarrhea

## 2020-02-21 ENCOUNTER — Ambulatory Visit (INDEPENDENT_AMBULATORY_CARE_PROVIDER_SITE_OTHER): Payer: BC Managed Care – PPO | Admitting: Orthopedic Surgery

## 2020-02-21 ENCOUNTER — Other Ambulatory Visit: Payer: Self-pay

## 2020-02-21 DIAGNOSIS — Z96612 Presence of left artificial shoulder joint: Secondary | ICD-10-CM

## 2020-02-21 MED ORDER — OXYCODONE HCL 10 MG PO TABS: 10.0000 mg | ORAL_TABLET | Freq: Two times a day (BID) | ORAL | 0 refills | Status: DC | PRN

## 2020-02-22 ENCOUNTER — Ambulatory Visit (HOSPITAL_COMMUNITY): Payer: BC Managed Care – PPO

## 2020-02-23 ENCOUNTER — Encounter: Payer: Self-pay | Admitting: Orthopedic Surgery
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
## 2020-02-27 ENCOUNTER — Encounter (HOSPITAL_COMMUNITY): Payer: Self-pay

## 2020-02-27 ENCOUNTER — Telehealth (HOSPITAL_COMMUNITY): Payer: Self-pay

## 2020-02-27 ENCOUNTER — Ambulatory Visit (HOSPITAL_COMMUNITY): Payer: BC Managed Care – PPO

## 2020-02-27 NOTE — Telephone Encounter (Signed)
Called patient regarding past 2 no shows. No option to leave a message per Home phone number. Letter was created and mailed explaining our attendance policy.  Pt will only be able to make one appointment at a time. If she has 3 consecutive no shows she will be discharged.   Limmie Patricia, OTR/L,CBIS  [phone]

## 2020-02-29 ENCOUNTER — Encounter (HOSPITAL_COMMUNITY): Payer: Self-pay

## 2020-02-29 ENCOUNTER — Ambulatory Visit (HOSPITAL_COMMUNITY): Payer: BC Managed Care – PPO

## 2020-02-29 NOTE — Therapy (Signed)
[place], [place], [address] Phone: [phone]   Fax:  [phone]  Patient Details  Name: Casey [name] MRN: [id number] Date of Birth: Mar 31, 1961 Referring Provider:  No ref. provider found  Encounter Date: 02/29/2020  OCCUPATIONAL THERAPY DISCHARGE SUMMARY  Visits from Start of Care: 3  Current functional level related to goals / functional outcomes: OT LONG TERM GOAL #1    Title  Patient will return to highest level of independence while using her LUE for all daily tasks approximately 75% of the time or more.    Time  12    Period  Weeks    Status  On-going        OT LONG TERM GOAL #2   Title  Patient will increase her LUE A/ROM to [facility] in order to complete required work tasks such as reaching overhead and down below.    Time  12    Period  Weeks    Status  On-going        OT LONG TERM GOAL #3   Title  Patient will increase LUE strength to 4+/5 in order to complete lightweight household lifting tasks with less difficulty.    Time  12    Period  Weeks    Status  On-going        OT LONG TERM GOAL #4   Title  Patient will decrease LUE fascial restrictions to min amount or less in order to increase her functional mobility needed to complete reaching tasks.    Time  12    Period  Weeks    Status  On-going        OT LONG TERM GOAL #5   Title  Patient will report a decrease in pain level of approximately 3/10 or less in the her LUE while completing daily tasks.    Time  12    Period  Weeks    Status  On-going    Patient with 3 no shows to our clinic with no return communication. Per our attendance policy we will discharge from therapy services. Letter was sent to patient on 02/29/20.    Remaining deficits: Decreased strength, ROM, and increased pain and fascial restrictions.    Education / Equipment: Shoulder HEP. Plan: Patient agrees to discharge.  Patient goals were not  met. Patient is being discharged due to not returning since the last visit.  ?????          Ailene Ravel, OTR/L,CBIS  [phone]  02/29/2020, 12:30 PM  [facility] [address] [place], [place], [address] Phone: [phone]   Fax:  [phone]

## 2020-03-05 ENCOUNTER — Ambulatory Visit (HOSPITAL_COMMUNITY): Payer: BC Managed Care – PPO

## 2020-03-07 ENCOUNTER — Encounter (HOSPITAL_COMMUNITY): Payer: BC Managed Care – PPO

## 2020-03-12 ENCOUNTER — Encounter (HOSPITAL_COMMUNITY): Payer: BC Managed Care – PPO

## 2020-03-14 ENCOUNTER — Encounter (HOSPITAL_COMMUNITY): Payer: BC Managed Care – PPO

## 2020-03-18 ENCOUNTER — Telehealth: Payer: Self-pay | Admitting: Orthopedic Surgery

## 2020-03-18 ENCOUNTER — Other Ambulatory Visit: Payer: Self-pay | Admitting: Surgical

## 2020-03-18 MED ORDER — HYDROCODONE-ACETAMINOPHEN 5-325 MG PO TABS: 1.0000 | ORAL_TABLET | Freq: Two times a day (BID) | ORAL | 0 refills | Status: DC | PRN

## 2020-03-18 NOTE — Telephone Encounter (Signed)
Tried calling to advise done. No answer. LMVM

## 2020-03-18 NOTE — Telephone Encounter (Signed)
Submitted [employer].  Tapering down

## 2020-03-18 NOTE — Telephone Encounter (Signed)
Pls advise. Thanks.  

## 2020-03-18 NOTE — Telephone Encounter (Signed)
Patient called.   Needs a refill on her oxycodone.   Call back: [phone]

## 2020-03-19 ENCOUNTER — Encounter (HOSPITAL_COMMUNITY): Payer: BC Managed Care – PPO

## 2020-03-21 ENCOUNTER — Encounter (HOSPITAL_COMMUNITY): Payer: BC Managed Care – PPO

## 2020-03-21 ENCOUNTER — Telehealth: Payer: Self-pay | Admitting: Orthopedic Surgery

## 2020-03-21 NOTE — Telephone Encounter (Signed)
Ok for this? 

## 2020-03-21 NOTE — Telephone Encounter (Signed)
Patient called stating that she needs an updated note stating that she can go back to work on 04/12/20.  CB#[phone].  Thank you.

## 2020-03-21 NOTE — Telephone Encounter (Signed)
Okay for this.  Next appointment on 5/26 so we can always postpone the 6/4 RTW if she's not doing well at that time

## 2020-03-22 ENCOUNTER — Telehealth: Payer: Self-pay | Admitting: Orthopedic Surgery

## 2020-03-22 NOTE — Telephone Encounter (Signed)
Tried calling patient, no answer. LMVM advising per Franky Macho.

## 2020-03-22 NOTE — Telephone Encounter (Signed)
Note written.  Advised patient could access through mychart.

## 2020-03-22 NOTE — Telephone Encounter (Signed)
She is almost 3 months out from surgery, we need to either taper her off medications or she we can refer her to pain management

## 2020-03-22 NOTE — Telephone Encounter (Signed)
Please advise on medication. Thanks.

## 2020-03-22 NOTE — Telephone Encounter (Signed)
Patient asked for this to be faxed to [employer] [phone]

## 2020-03-22 NOTE — Telephone Encounter (Signed)
Patient called advised the Hydrocodone is not helping with the pain. Patient said she was on Oxycodone.  Patient said [employer] will fax over information today concerning patient's short Term Disability. The number to contact patient is [phone]

## 2020-03-26 ENCOUNTER — Encounter (HOSPITAL_COMMUNITY): Payer: BC Managed Care – PPO | Admitting: Occupational Therapy

## 2020-03-28 ENCOUNTER — Encounter (HOSPITAL_COMMUNITY): Payer: BC Managed Care – PPO | Admitting: Occupational Therapy

## 2020-04-01 ENCOUNTER — Telehealth: Payer: Self-pay | Admitting: Orthopedic Surgery

## 2020-04-01 ENCOUNTER — Other Ambulatory Visit: Payer: Self-pay | Admitting: Surgical

## 2020-04-01 MED ORDER — HYDROCODONE-ACETAMINOPHEN 5-325 MG PO TABS: 1.0000 | ORAL_TABLET | Freq: Every day | ORAL | 0 refills | Status: DC | PRN

## 2020-04-01 NOTE — Telephone Encounter (Signed)
Please advise 

## 2020-04-01 NOTE — Telephone Encounter (Signed)
Patient called needing Rx refilled (Hydrocodone) The number to contact patient is [phone]

## 2020-04-01 NOTE — Telephone Encounter (Signed)
Patient called.  She is not satisfied with the prescription called in for her. She is requesting a higher dosage or an alternate prescription.   Call back: [phone]

## 2020-04-02 ENCOUNTER — Encounter (HOSPITAL_COMMUNITY): Payer: BC Managed Care – PPO | Admitting: Occupational Therapy

## 2020-04-03 ENCOUNTER — Other Ambulatory Visit: Payer: Self-pay

## 2020-04-03 ENCOUNTER — Telehealth: Payer: Self-pay | Admitting: Orthopedic Surgery

## 2020-04-03 ENCOUNTER — Encounter: Payer: Self-pay | Admitting: Orthopedic Surgery

## 2020-04-03 ENCOUNTER — Ambulatory Visit (INDEPENDENT_AMBULATORY_CARE_PROVIDER_SITE_OTHER): Payer: BC Managed Care – PPO | Admitting: Orthopedic Surgery

## 2020-04-03 VITALS — Ht 65.5 in | Wt 146.0 lb

## 2020-04-03 DIAGNOSIS — Z96612 Presence of left artificial shoulder joint: Secondary | ICD-10-CM | POA: Diagnosis not present

## 2020-04-03 NOTE — Telephone Encounter (Signed)
Please advise 

## 2020-04-03 NOTE — Telephone Encounter (Signed)
Patient called. Would like to know if she could go back to work the week of June 7th? Her call back number is [phone]

## 2020-04-04 ENCOUNTER — Encounter (HOSPITAL_COMMUNITY): Payer: BC Managed Care – PPO | Admitting: Occupational Therapy

## 2020-04-04 ENCOUNTER — Telehealth: Payer: Self-pay | Admitting: Orthopedic Surgery

## 2020-04-04 MED ORDER — OXYCODONE-ACETAMINOPHEN 5-325 MG PO TABS: 1.0000 | ORAL_TABLET | Freq: Two times a day (BID) | ORAL | 0 refills | Status: DC | PRN

## 2020-04-04 NOTE — Telephone Encounter (Signed)
Patient called requesting a call back from Dr. August Saucer. Patient has questions and concerns. Patient phone number is [phone].

## 2020-04-07 ENCOUNTER — Encounter: Payer: Self-pay | Admitting: Orthopedic Surgery

## 2020-04-07 NOTE — Progress Notes (Signed)
Office Visit Note   Patient: Casey [name]           Date of Birth: [birth year]           MRN: [id number] Visit Date: 04/03/2020 Requested by: Sinda Du, MD No address on file PCP: Sinda Du, MD  Subjective: Chief Complaint  Patient presents with  . Left Shoulder - Follow-up    01/01/2020 Left [facility]    HPI: Sinda is a 59 year old patient who is now over 3 months out left reverse shoulder replacement.  Shoulder is a lot better than it was before surgery but still not 100%.  She still has pain with certain movements.  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  She also has to go overhead at times.  Taking oxycodone but not on a regular basis.  We talked about weaning herself off that pain medication.  1 more prescription provided but that will be it and she will have to go with Tylenol and ibuprofen after that.              ROS: All systems reviewed are negative as they relate to the chief complaint within the history of present illness.  Patient denies  fevers or chills.   Assessment & Plan: Visit Diagnoses:  1. Status post reverse arthroplasty of left shoulder     Plan: Impression is pretty reasonable return of function following left reverse shoulder replacement.  In general for patients this young with those replacements I would like for her to be on a permanent 15 pound lifting limit with no real overhead lifting.  We will see her back in 8 weeks for final clinical check.  Continue with home exercise program of stretching and strengthening.  Follow-Up Instructions: Return in about 8 weeks (around 05/29/2020).   Orders:  No orders of the defined types were placed in this encounter.  Meds ordered this encounter  Medications  . oxyCODONE-acetaminophen (PERCOCET/ROXICET) 5-325 MG tablet    Sig: Take 1 tablet by mouth every 12 (twelve) hours as needed for severe pain.    Dispense:  40 tablet    Refill:  0       Procedures: No procedures performed   Clinical Data: No additional findings.  Objective: Vital Signs: Ht 5' 5.5" (1.664 m)   Wt 146 lb (66.2 kg)   LMP  (LMP Unknown)   BMI 23.93 kg/m   Physical Exam:   Constitutional: Patient appears well-developed HEENT:  Head: Normocephalic Eyes:EOM are normal Neck: Normal range of motion Cardiovascular: Normal rate Pulmonary/chest: Effort normal Neurologic: Patient is alert Skin: Skin is warm Psychiatric: Patient has normal mood and affect    Ortho Exam: Ortho exam demonstrates functional deltoid.  She is able get her hand to the top of her head.  Forward flexion abduction are just beyond 90 degrees.  Motor or sensory function of the hand is intact.  Specialty Comments:  No specialty comments available.  Imaging: No results found.   PMFS History: Patient Active Problem List   Diagnosis Date Noted  . Closed fracture of left proximal humerus 01/01/2020  . Fracture of base of fifth metacarpal bone of right hand 10/06/2016  . Benzodiazepine dependence, continuous ([facility]) 01/09/2016  . History of panic attacks 09/11/2014  . Malaise 09/03/2014  . Fatigue 09/03/2014  . Body mass index (BMI) of 20.0-20.9 in adult 09/03/2014  . Right clavicle fracture 12/12/2013  .  Clavicle fracture 11/07/2013   Past Medical History:  Diagnosis Date  . Anxiety   . Chronic back pain   . Depression   . History of panic attacks 09/11/2014  . Medical history non-contributory   . Ruptured disc, thoracic   . Suicide attempt (HCC) 2002    Family History  Problem Relation Age of Onset  . Diabetes Mother   . Pulmonary fibrosis Mother   . Heart attack Father   . Parkinson's disease Sister   . Bipolar disorder Sister   . Thyroid disease Sister   . Heart attack Brother   . Other Son        vascular neucrosis  . Pulmonary fibrosis Maternal Grandmother   . Diabetes Maternal Grandfather   . Stroke Maternal Grandfather   . Pneumonia Paternal  Grandfather   . Cancer Sister        melonoma  . Other Brother        back problems, nerve problems, soft bones    Past Surgical History:  Procedure Laterality Date  . CESAREAN SECTION    . CLAVICLE SURGERY    . REVERSE SHOULDER ARTHROPLASTY Left 01/01/2020   Procedure: LEFT REVERSE SHOULDER ARTHROPLASTY;  Surgeon: Cammy Copa, MD;  Location: [facility] OR;  Service: Orthopedics;  Laterality: Left;  . TUBAL LIGATION     Social History   Occupational History  . Not on file  Tobacco Use  . Smoking status: Current Every Day Smoker    Packs/day: 0.25    Types: Cigarettes  . Smokeless tobacco: Never Used  . Tobacco comment: 2-3 cigarettes per day  Substance and Sexual Activity  . Alcohol use: Yes    Alcohol/week: 1.0 standard drinks    Types: 1 Cans of beer per week    Comment: occ   . Drug use: Never    Types: "Crack" cocaine, Benzodiazepines  . Sexual activity: Not Currently    Birth control/protection: Post-menopausal

## 2020-04-09 ENCOUNTER — Encounter (HOSPITAL_COMMUNITY): Payer: BC Managed Care – PPO | Admitting: Occupational Therapy

## 2020-04-11 ENCOUNTER — Telehealth: Payer: Self-pay | Admitting: Orthopedic Surgery

## 2020-04-11 ENCOUNTER — Encounter (HOSPITAL_COMMUNITY): Payer: BC Managed Care – PPO | Admitting: Occupational Therapy

## 2020-04-11 NOTE — Telephone Encounter (Signed)
Lmom twice

## 2020-04-11 NOTE — Telephone Encounter (Signed)
I left mssg on machine
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
## 2020-04-11 NOTE — Telephone Encounter (Signed)
Pt called wanting Dr. August Saucer to extend her FMLA until 04/30/20 so she can try to stretch out disability until July.  [phone]

## 2020-04-11 NOTE — Telephone Encounter (Signed)
machine

## 2020-04-11 NOTE — Telephone Encounter (Signed)
See below. Not sure what you were trying to say. Please further advise.  Thanks.

## 2020-04-12 ENCOUNTER — Telehealth: Payer: Self-pay | Admitting: Orthopedic Surgery

## 2020-04-12 NOTE — Telephone Encounter (Signed)
error 

## 2020-04-12 NOTE — Telephone Encounter (Signed)
Ok for this? 

## 2020-04-12 NOTE — Telephone Encounter (Signed)
Called patient advised waiting on response from Dr August Saucer.

## 2020-04-12 NOTE — Telephone Encounter (Signed)
Patient called.   She wants to know if she can return to work by June 16th   Call back: [phone]

## 2020-04-15 NOTE — Telephone Encounter (Signed)
Tried calling patient. No answer and no VM to LM. Note put at front desk for patient

## 2020-04-15 NOTE — Telephone Encounter (Signed)
yes

## 2020-04-16 ENCOUNTER — Telehealth: Payer: Self-pay | Admitting: Orthopedic Surgery

## 2020-04-16 ENCOUNTER — Encounter (HOSPITAL_COMMUNITY): Payer: BC Managed Care – PPO | Admitting: Occupational Therapy

## 2020-04-16 NOTE — Telephone Encounter (Signed)
Needs rov before rtw ok for note

## 2020-04-16 NOTE — Telephone Encounter (Signed)
Created note LMVM advising per Dr August Saucer

## 2020-04-16 NOTE — Telephone Encounter (Signed)
Ok to extend this?

## 2020-04-16 NOTE — Telephone Encounter (Signed)
Patient called asked if the note can state that she is to return to work 04/22/2020 or 04/23/2020. Patient asked for a call when note has been changed and up front for pickup. Please leave on voicemail if she does not answer the phone.  The number to contact patient is [phone]

## 2020-04-18 ENCOUNTER — Encounter (HOSPITAL_COMMUNITY): Payer: BC Managed Care – PPO | Admitting: Occupational Therapy

## 2020-04-19 ENCOUNTER — Telehealth: Payer: Self-pay | Admitting: Orthopedic Surgery

## 2020-04-19 DIAGNOSIS — Z96612 Presence of left artificial shoulder joint: Secondary | ICD-10-CM

## 2020-04-19 NOTE — Telephone Encounter (Signed)
Pt would like some pain medicine called in so she can get through work on Monday.   [phone]

## 2020-04-20 ENCOUNTER — Other Ambulatory Visit: Payer: Self-pay

## 2020-04-20 ENCOUNTER — Emergency Department (HOSPITAL_COMMUNITY): Payer: BC Managed Care – PPO

## 2020-04-20 ENCOUNTER — Emergency Department (HOSPITAL_COMMUNITY)
Admission: EM | Admit: 2020-04-20 | Discharge: 2020-04-20 | Disposition: A | Discharge status: Home / Self Care | Payer: BC Managed Care – PPO | Attending: Emergency Medicine | Admitting: Emergency Medicine

## 2020-04-20 ENCOUNTER — Encounter (HOSPITAL_COMMUNITY): Payer: Self-pay | Admitting: *Deleted

## 2020-04-20 DIAGNOSIS — S93402A Sprain of unspecified ligament of left ankle, initial encounter: Secondary | ICD-10-CM | POA: Diagnosis not present

## 2020-04-20 DIAGNOSIS — M79672 Pain in left foot: Secondary | ICD-10-CM | POA: Diagnosis not present

## 2020-04-20 DIAGNOSIS — X501XXA Overexertion from prolonged static or awkward postures, initial encounter: Secondary | ICD-10-CM | POA: Insufficient documentation

## 2020-04-20 DIAGNOSIS — Y9301 Activity, walking, marching and hiking: Secondary | ICD-10-CM | POA: Diagnosis not present

## 2020-04-20 DIAGNOSIS — M7989 Other specified soft tissue disorders: Secondary | ICD-10-CM | POA: Diagnosis not present

## 2020-04-20 DIAGNOSIS — F17210 Nicotine dependence, cigarettes, uncomplicated: Secondary | ICD-10-CM | POA: Insufficient documentation

## 2020-04-20 DIAGNOSIS — Y9289 Other specified places as the place of occurrence of the external cause: Secondary | ICD-10-CM | POA: Insufficient documentation

## 2020-04-20 DIAGNOSIS — M25572 Pain in left ankle and joints of left foot: Secondary | ICD-10-CM | POA: Diagnosis not present

## 2020-04-20 DIAGNOSIS — S99912A Unspecified injury of left ankle, initial encounter: Secondary | ICD-10-CM | POA: Diagnosis not present

## 2020-04-20 DIAGNOSIS — Y999 Unspecified external cause status: Secondary | ICD-10-CM | POA: Diagnosis not present

## 2020-04-20 MED ORDER — OXYCODONE HCL 5 MG PO TABS
5.0000 mg | ORAL_TABLET | Freq: Once | ORAL | Status: AC
  Administered 2020-04-20: 5 mg via ORAL
  Filled 2020-04-20: qty 1

## 2020-04-20 MED ORDER — NAPROXEN 500 MG PO TABS: 500.0000 mg | ORAL_TABLET | Freq: Two times a day (BID) | ORAL | 0 refills | Status: DC
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
## 2020-04-20 NOTE — Discharge Instructions (Addendum)
You were evaluated in the Emergency Department and after careful evaluation, we did not find any emergent condition requiring admission or further testing in the hospital.  Your exam/testing today was overall reassuring.  X-rays did not show any broken bones.  Suspect that you have a sprain.  Please take the Naprosyn anti-inflammatory as directed and use ice and rest as we discussed.  Please return to the Emergency Department if you experience any worsening of your condition.  We encourage you to follow up with a primary care provider.  Thank you for allowing [place] to be a part of your care.
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
## 2020-04-22 NOTE — Telephone Encounter (Signed)
Her last pain medication RX was the last one.  We can do a stronger anti-inflammatory instead of OTC Ibuprofen if she's able to take NSAIDs but otherwise no narcotics anymore

## 2020-04-22 NOTE — Telephone Encounter (Signed)
Pls advise.  

## 2020-04-23 ENCOUNTER — Telehealth: Payer: Self-pay | Admitting: Orthopedic Surgery

## 2020-04-23 NOTE — Telephone Encounter (Signed)
Patient called asking if we could please fax her RTW note to [employer] at [phone].  She also requested that Dr. August Saucer send her to [facility] on [address].  CB#[phone].  Thank you.

## 2020-04-23 NOTE — Telephone Encounter (Signed)
Referral entered into system. Advised patient referral for pain management put in.

## 2020-04-23 NOTE — Telephone Encounter (Signed)
Patient request referral to Pain Management. Denied rx for anti-inflammatory. Ok for referral?

## 2020-04-24 NOTE — Telephone Encounter (Signed)
I faxed work note. Can you please edit referral for her to be referred to [facility] [address]?

## 2020-04-24 NOTE — Telephone Encounter (Signed)
Noted  

## 2020-04-25 ENCOUNTER — Telehealth: Payer: Self-pay | Admitting: Orthopedic Surgery

## 2020-04-25 NOTE — Telephone Encounter (Signed)
faxed

## 2020-04-25 NOTE — Telephone Encounter (Signed)
Pt states she was supposed to call with a fax # for her insurance company so we can send them something letting them know she went back to work 04/22/20.   Fax# [phone] [employer]

## 2020-05-01 ENCOUNTER — Telehealth: Payer: Self-pay

## 2020-05-01 NOTE — Telephone Encounter (Signed)
Patient called in wanting to discuss about going back to work and also was wanting to know about documentation for her short term disability.

## 2020-05-01 NOTE — Telephone Encounter (Signed)
Pt would like a CB when we receive her FMLA paperwork; so she can know if she'll have to pay the $25 again and she would like oxycodone 5 to help with the pain until she can go back to her pain management.  [phone]

## 2020-05-02 ENCOUNTER — Other Ambulatory Visit: Payer: Self-pay | Admitting: Surgical

## 2020-05-02 MED ORDER — OXYCODONE-ACETAMINOPHEN 5-325 MG PO TABS: 1.0000 | ORAL_TABLET | Freq: Every day | ORAL | 0 refills | Status: DC | PRN

## 2020-05-02 NOTE — Telephone Encounter (Signed)
Please advise on pain medication request.

## 2020-05-02 NOTE — Telephone Encounter (Signed)
Tried calling to discuss. No answer LMVM advising per [facility] below. Also advised on VM she could call back to further discuss forms.

## 2020-05-02 NOTE — Telephone Encounter (Signed)
I can do a one-time RX of Oxycodone but it must last until she returns to pain management. Submitted to her pharmacy

## 2020-05-28 DIAGNOSIS — F419 Anxiety disorder, unspecified: Secondary | ICD-10-CM | POA: Diagnosis not present

## 2020-05-28 DIAGNOSIS — R0789 Other chest pain: Secondary | ICD-10-CM | POA: Diagnosis not present

## 2020-06-12 ENCOUNTER — Telehealth: Payer: Self-pay | Admitting: Orthopedic Surgery

## 2020-06-12 NOTE — Telephone Encounter (Signed)
When is her pain management appointment? I dont see one in her chart

## 2020-06-12 NOTE — Telephone Encounter (Signed)
Patient called.   She wanted to know if she could be given pain medicine to hold her until she gets set up with pain management.   Call back: [phone]

## 2020-06-12 NOTE — Telephone Encounter (Signed)
Pls advise.  

## 2020-06-13 NOTE — Telephone Encounter (Signed)
Tried calling. Daughter in law who answered phone stated patient not available. She stated she would have her to call our office back.

## 2020-06-13 NOTE — Telephone Encounter (Signed)
Per note in referral [facility] tried calling patient on 06/17 and 06/21 to schedule appointment and patient did not answer or return call.

## 2020-06-18 ENCOUNTER — Telehealth: Payer: Self-pay

## 2020-06-18 NOTE — Telephone Encounter (Signed)
Patient called the office today about getting a refill on her pain medication. I discussed with her at length per the last note that [facility] denied rxrf until she had an appointment scheduled with Pain Clinic. Referral was made to [facility] for pain management referral back in June. They have reached out to patient multiple times for referral. I called [facility] today and LM for referral coordinator to please call us back about getting patient scheduled. Patient was very upset on the phone, and cursing.  She stated "you all are just leaving me for dirt, not helping me and not doing anything for my pain except to let me hurt".  I advised patient multiple times that we do not use profanity and that we could speak kindly and I would do my best to help her. Patient got angry and hung up.

## 2020-06-18 NOTE — Telephone Encounter (Signed)
Gotcha - sorry

## 2020-06-18 NOTE — Telephone Encounter (Signed)
LM for [facility] to call be back to discuss status of referral to pain management.

## 2020-08-05 ENCOUNTER — Telehealth: Payer: Self-pay

## 2020-08-05 NOTE — Telephone Encounter (Signed)
Did [facility] referral coordinator reach back out to [place] since 8/10?

## 2020-08-05 NOTE — Telephone Encounter (Signed)
Please advise. Thanks.  

## 2020-08-05 NOTE — Telephone Encounter (Signed)
Patient called regarding last message call [phone]

## 2020-08-05 NOTE — Telephone Encounter (Signed)
Patient called in saying pain clinic she was ref to is not getting her an appt sch, says she needs pain meds been in pain .Marland Kitchen wants to give her a call

## 2020-08-06 NOTE — Telephone Encounter (Signed)
IC patient. No answer. LMVM advising we were trying to reach out to [facility] to check status of referral.

## 2020-08-06 NOTE — Telephone Encounter (Signed)
I know we inquired about this back in June. Will you see if we can get an update from [facility]?

## 2020-08-13 NOTE — Telephone Encounter (Signed)
It's on her then, I will not RX any more pain medication.

## 2020-08-13 NOTE — Telephone Encounter (Signed)
Tried calling patient to advise. No answer. LMVM to call to discuss.

## 2020-08-13 NOTE — Telephone Encounter (Signed)
Pt needs to contact [facility], they have tried numerous times to contact her to schedule. [phone] ext [phone]

## 2020-08-13 NOTE — Telephone Encounter (Signed)
See below

## 2020-08-29 ENCOUNTER — Telehealth: Payer: Self-pay | Admitting: Orthopedic Surgery

## 2020-08-29 NOTE — Telephone Encounter (Signed)
Patient called about an update of pain meds. Please call patient at [phone].

## 2020-08-29 NOTE — Telephone Encounter (Signed)
Is there a different pain management place we can refer her to?

## 2020-08-29 NOTE — Telephone Encounter (Signed)
Patient called requesting a call back. Patient is asking for pain medication. PAtient states she has tried to make an appt with pain management and they wont give her an appt. She asked for a refill and for the doctor to make her an appt. Please call patient at [phone].

## 2020-08-29 NOTE — Telephone Encounter (Signed)
See below. Patient calling again about pain meds.

## 2020-08-30 ENCOUNTER — Other Ambulatory Visit: Payer: Self-pay

## 2020-08-30 DIAGNOSIS — Z96612 Presence of left artificial shoulder joint: Secondary | ICD-10-CM

## 2020-08-30 NOTE — Telephone Encounter (Signed)
See previous note for further documentation

## 2020-08-30 NOTE — Telephone Encounter (Signed)
IC talked with patient at length. We are having to start the complete referral process over. Patient asking if anything can be done for her pain in the meantime.

## 2020-08-30 NOTE — Telephone Encounter (Signed)
Please place new order for pain management. thanks

## 2020-08-30 NOTE — Telephone Encounter (Signed)
See below

## 2020-09-01 ENCOUNTER — Other Ambulatory Visit: Payer: Self-pay | Admitting: Surgical

## 2020-09-01 MED ORDER — HYDROCODONE-ACETAMINOPHEN 5-325 MG PO TABS: 1.0000 | ORAL_TABLET | Freq: Every day | ORAL | 0 refills | Status: DC | PRN

## 2020-09-01 NOTE — Telephone Encounter (Signed)
Sent in RX for Norco. She must make this last until pain management

## 2020-09-02 NOTE — Telephone Encounter (Signed)
Tried again, no answer

## 2020-09-02 NOTE — Telephone Encounter (Signed)
Tried calling. Female answered phone stating she was not there. I asked her to please have patient call me to discuss.

## 2020-09-04 NOTE — Telephone Encounter (Signed)
Got it, thanks for the effort

## 2020-09-04 NOTE — Telephone Encounter (Signed)
Tried again no answer. Sending to you as FYI, have tried to contact patient about pain meds and cannot get response from her to advise per your note below.

## 2020-10-25 ENCOUNTER — Telehealth: Payer: Self-pay | Admitting: Orthopedic Surgery

## 2020-10-25 NOTE — Telephone Encounter (Signed)
Received disability form and $25.00 check from patient     Forwarding to [facility] today

## 2020-10-25 NOTE — Telephone Encounter (Signed)
Patient called with information that she has received in reference to her visits with Dr Romeo Apple, January of 2021, for shoulder injury. States she has [employer] psychotherapist that may cover while she was out of work. Discussed forms protocol through [employer].  States will bring in forms and complete [employer] authorizations.

## 2020-11-07 ENCOUNTER — Telehealth: Payer: Self-pay | Admitting: Orthopedic Surgery

## 2020-11-07 NOTE — Telephone Encounter (Signed)
Pt called asking if Dr.Dean would be willing to send in a rx of norco/vicodin? She states she would only need 20 tablets and that would last her a month; she would like a CB if Dr.Dean will send this.   [phone]

## 2020-11-07 NOTE — Telephone Encounter (Signed)
Per Casey [name] I called patient and advised unable to provide with narcotics Patient stated she decided not to follow through with pain management referral.

## 2021-02-17 ENCOUNTER — Other Ambulatory Visit (HOSPITAL_COMMUNITY): Payer: Self-pay | Admitting: Family Medicine

## 2021-02-17 DIAGNOSIS — Z1231 Encounter for screening mammogram for malignant neoplasm of breast: Secondary | ICD-10-CM

## 2021-02-17 DIAGNOSIS — E2839 Other primary ovarian failure: Secondary | ICD-10-CM

## 2021-03-10 ENCOUNTER — Other Ambulatory Visit (HOSPITAL_COMMUNITY): Payer: BC Managed Care – PPO

## 2021-03-10 ENCOUNTER — Ambulatory Visit (HOSPITAL_COMMUNITY): Payer: BC Managed Care – PPO

## 2021-03-10 ENCOUNTER — Encounter (HOSPITAL_COMMUNITY): Payer: Self-pay
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
## 2021-08-20 ENCOUNTER — Other Ambulatory Visit: Payer: Self-pay

## 2021-08-20 ENCOUNTER — Encounter: Payer: Self-pay | Admitting: Family Medicine

## 2021-08-20 ENCOUNTER — Ambulatory Visit (INDEPENDENT_AMBULATORY_CARE_PROVIDER_SITE_OTHER): Payer: 59 | Admitting: Family Medicine

## 2021-08-20 VITALS — BP 155/81 | HR 83 | Temp 98.0°F | Resp 16 | Ht 65.0 in | Wt 140.6 lb

## 2021-08-20 DIAGNOSIS — R030 Elevated blood-pressure reading, without diagnosis of hypertension: Secondary | ICD-10-CM | POA: Diagnosis not present

## 2021-08-20 DIAGNOSIS — F339 Major depressive disorder, recurrent, unspecified: Secondary | ICD-10-CM | POA: Diagnosis not present

## 2021-08-20 DIAGNOSIS — F411 Generalized anxiety disorder: Secondary | ICD-10-CM | POA: Diagnosis not present

## 2021-08-20 DIAGNOSIS — F5101 Primary insomnia: Secondary | ICD-10-CM | POA: Insufficient documentation

## 2021-08-20 DIAGNOSIS — Z23 Encounter for immunization: Secondary | ICD-10-CM

## 2021-08-20 MED ORDER — CALCIUM CARBONATE-VITAMIN D 500-200 MG-UNIT PO TABS: 1.0000 | ORAL_TABLET | Freq: Every day | ORAL | Status: DC

## 2021-08-20 MED ORDER — HYDROXYZINE HCL 50 MG PO TABS: 50.0000 mg | ORAL_TABLET | Freq: Every evening | ORAL | 3 refills | Status: DC | PRN

## 2021-08-20 MED ORDER — CITALOPRAM HYDROBROMIDE 20 MG PO TABS: 20.0000 mg | ORAL_TABLET | Freq: Every day | ORAL | 4 refills | Status: DC

## 2021-08-20 NOTE — Progress Notes (Signed)
Subjective:  Patient ID: Casey [name], female    DOB: May 26, 1961, 60 y.o.   MRN: [id number]  Patient Care Team: Sonny Masters, FNP as PCP - General (Family Medicine)   Chief Complaint:  New Patient (Initial Visit) (Establish care/insomnia)   HPI: Casey [name] is a 60 y.o. female presenting on 08/20/2021 for New Patient (Initial Visit) (Establish care/insomnia)   Pt presents today to establish care with new PCP and for refill on insomnia medications. She was followed by [facility] but they no longer take her insurance. She is due for PAP, colonoscopy, and mammogram and would like to make an appointment for physical. Her main concern today is her insomnia. States she has a difficult time falling asleep and then staying asleep. She has been on vistrail recently with some relief of symptoms. She has taken trazodone in the past but states it was not beneficial after taking for several months. She has not added Melatonin to any of her previous regimens to see if helpful. She denies excessive caffeine use. No specific bedtime routine.  She also reports depression and anxiety. She was been on Celexa and feels this is somewhat beneficial. She denies current SI or HI.   GAD 7 : Generalized Anxiety Score 08/20/2021  Nervous, Anxious, on Edge 2  Control/stop worrying 2  Worry too much - different things 2  Trouble relaxing 2  Restless 1  Easily annoyed or irritable 1  Afraid - awful might happen 1  Total GAD 7 Score 11  Anxiety Difficulty Not difficult at all    Depression screen [facility] 2/9 08/20/2021  Decreased Interest 1  Down, Depressed, Hopeless 1  PHQ - 2 Score 2  Altered sleeping 3  Tired, decreased energy 2  Change in appetite 3  Feeling bad or failure about yourself  2  Trouble concentrating 1  Moving slowly or fidgety/restless 1  Suicidal thoughts 0  PHQ-9 Score 14  Difficult doing work/chores Not difficult at all     Relevant past medical, surgical, family, and  social history reviewed and updated as indicated.  Allergies and medications reviewed and updated. Data reviewed: Chart in Epic.   Past Medical History:  Diagnosis Date   Anxiety    Chronic back pain    Depression    History of panic attacks 09/11/2014   Medical history non-contributory    Ruptured disc, thoracic    Suicide attempt (HCC) 2002    Past Surgical History:  Procedure Laterality Date   CESAREAN SECTION     CLAVICLE SURGERY     REVERSE SHOULDER ARTHROPLASTY Left 01/01/2020   Procedure: LEFT REVERSE SHOULDER ARTHROPLASTY;  Surgeon: Cammy Copa, MD;  Location: [facility];  Service: Orthopedics;  Laterality: Left;   TUBAL LIGATION      Social History   Socioeconomic History   Marital status: Single    Spouse name: Not on file   Number of children: Not on file   Years of education: Not on file   Highest education level: Not on file  Occupational History   Not on file  Tobacco Use   Smoking status: Every Day    Packs/day: 0.25    Types: Cigarettes   Smokeless tobacco: Never   Tobacco comments:    2-3 cigarettes per day  Vaping Use   Vaping Use: Never used  Substance and Sexual Activity   Alcohol use: Yes    Alcohol/week: 1.0 standard drink    Types: 1 Cans of  beer per week    Comment: occ    Drug use: Never    Types: "Crack" cocaine, Benzodiazepines   Sexual activity: Not Currently    Birth control/protection: Post-menopausal  Other Topics Concern   Not on file  Social History Narrative   Not on file   Social Determinants of Health   Financial Resource Strain: Not on file  Food Insecurity: Not on file  Transportation Needs: Not on file  Physical Activity: Not on file  Stress: Not on file  Social Connections: Not on file  Intimate Partner Violence: Not on file    Outpatient Encounter Medications as of 08/20/2021  Medication Sig   potassium chloride (KLOR-CON) 10 MEQ tablet Take 1 tablet (10 mEq total) by mouth daily for 5 days.    [DISCONTINUED] celecoxib (CELEBREX) 200 MG capsule TAKE 1 CAPSULE 2 TIMES DAILY. ADDITIONALLY, TAKE 400MG  THE MORNING OF YOUR SURGERY AS SOON AS YOU WAKE UP WITH A SMALL SIP OF WATER.   [DISCONTINUED] citalopram (CELEXA) 20 MG tablet Take 20 mg by mouth daily.    [DISCONTINUED] hydrOXYzine (ATARAX/VISTARIL) 50 MG tablet Take by mouth.   calcium-vitamin D (OSCAL WITH D) 500-200 MG-UNIT tablet Take 1 tablet by mouth daily with breakfast. For bone health   citalopram (CELEXA) 20 MG tablet Take 1 tablet (20 mg total) by mouth daily.   hydrOXYzine (ATARAX/VISTARIL) 50 MG tablet Take 1 tablet (50 mg total) by mouth at bedtime as needed (insomnia).   [DISCONTINUED] calcium-vitamin D (OSCAL WITH D) 500-200 MG-UNIT tablet Take 1 tablet by mouth daily with breakfast. For bone health   [DISCONTINUED] HYDROcodone-acetaminophen (NORCO/VICODIN) 5-325 MG tablet Take 1 tablet by mouth daily as needed for moderate pain.   [DISCONTINUED] methocarbamol (ROBAXIN) 500 MG tablet Take 1 tablet (500 mg total) by mouth every 8 (eight) hours as needed for muscle spasms.   [DISCONTINUED] naproxen (NAPROSYN) 500 MG tablet Take 1 tablet (500 mg total) by mouth 2 (two) times daily.   [DISCONTINUED] omega-3 acid ethyl esters (LOVAZA) 1 g capsule Take 1 capsule (1 g total) by mouth daily before breakfast. For high cholesterol (Patient not taking: Reported on 12/20/2019)   [DISCONTINUED] ondansetron (ZOFRAN) 4 MG tablet Take 1 tablet (4 mg total) by mouth every 8 (eight) hours as needed for nausea or vomiting.   No facility-administered encounter medications on file as of 08/20/2021.    Allergies  Allergen Reactions   Ampicillin Other (See Comments)    Reaction is unknown    Review of Systems  Constitutional:  Positive for appetite change and fatigue. Negative for chills, diaphoresis, fever and unexpected weight change.  HENT: Negative.    Eyes: Negative.   Respiratory:  Negative for cough, chest tightness and shortness of  breath.   Cardiovascular:  Negative for chest pain, palpitations and leg swelling.  Gastrointestinal:  Negative for abdominal pain, blood in stool, constipation, diarrhea, nausea and vomiting.  Endocrine: Negative.  Negative for cold intolerance, heat intolerance, polydipsia, polyphagia and polyuria.  Genitourinary:  Negative for decreased urine volume, difficulty urinating, dysuria, frequency and urgency.  Musculoskeletal:  Negative for arthralgias and myalgias.  Skin: Negative.   Allergic/Immunologic: Negative.   Neurological:  Negative for dizziness, tremors, seizures, syncope, facial asymmetry, speech difficulty, weakness, light-headedness, numbness and headaches.  Hematological: Negative.   Psychiatric/Behavioral:  Positive for agitation, decreased concentration, dysphoric mood and sleep disturbance. Negative for behavioral problems, confusion, hallucinations, self-injury and suicidal ideas. The patient is nervous/anxious. The patient is not hyperactive.   All other systems  reviewed and are negative.      Objective:  BP (!) 155/81   Pulse 83   Temp 98 F (36.7 C)   Resp 16   Ht 5\' 5"  (1.651 m)   Wt 140 lb 9.6 oz (63.8 kg)   LMP  (LMP Unknown)   SpO2 99%   BMI 23.40 kg/m    Wt Readings from Last 3 Encounters:  08/20/21 140 lb 9.6 oz (63.8 kg)  04/20/20 142 lb (64.4 kg)  04/03/20 146 lb (66.2 kg)    Physical Exam Vitals and nursing note reviewed.  Constitutional:      General: She is not in acute distress.    Appearance: Normal appearance. She is well-developed, well-groomed and normal weight. She is not ill-appearing, toxic-appearing or diaphoretic.  HENT:     Head: Normocephalic and atraumatic.     Jaw: There is normal jaw occlusion.     Right Ear: Hearing normal.     Left Ear: Hearing normal.     Nose: Nose normal.     Mouth/Throat:     Lips: Pink.     Mouth: Mucous membranes are moist.     Pharynx: Oropharynx is clear. Uvula midline.  Eyes:     General: Lids  are normal.     Extraocular Movements: Extraocular movements intact.     Conjunctiva/sclera: Conjunctivae normal.     Pupils: Pupils are equal, round, and reactive to light.  Neck:     Thyroid: No thyroid mass, thyromegaly or thyroid tenderness.     Vascular: No carotid bruit or JVD.     Trachea: Trachea and phonation normal.  Cardiovascular:     Rate and Rhythm: Normal rate and regular rhythm.     Chest Wall: PMI is not displaced.     Pulses: Normal pulses.     Heart sounds: Normal heart sounds. No murmur heard.   No friction rub. No gallop.  Pulmonary:     Effort: Pulmonary effort is normal. No respiratory distress.     Breath sounds: Normal breath sounds. No wheezing.  Abdominal:     General: Bowel sounds are normal. There is no distension or abdominal bruit.     Palpations: Abdomen is soft. There is no hepatomegaly or splenomegaly.     Tenderness: There is no abdominal tenderness. There is no right CVA tenderness or left CVA tenderness.     Hernia: No hernia is present.  Musculoskeletal:        General: Normal range of motion.     Cervical back: Normal range of motion and neck supple.     Right lower leg: No edema.     Left lower leg: No edema.  Lymphadenopathy:     Cervical: No cervical adenopathy.  Skin:    General: Skin is warm and dry.     Capillary Refill: Capillary refill takes less than 2 seconds.     Coloration: Skin is not cyanotic, jaundiced or pale.     Findings: No rash.  Neurological:     General: No focal deficit present.     Mental Status: She is alert and oriented to person, place, and time.     Cranial Nerves: Cranial nerves are intact. No cranial nerve deficit.     Sensory: Sensation is intact. No sensory deficit.     Motor: Motor function is intact. No weakness.     Coordination: Coordination is intact. Coordination normal.     Gait: Gait is intact. Gait normal.  Deep Tendon Reflexes: Reflexes are normal and symmetric. Reflexes normal.   Psychiatric:        Attention and Perception: Attention and perception normal.        Mood and Affect: Affect normal. Mood is depressed.        Speech: Speech normal.        Behavior: Behavior normal. Behavior is cooperative.        Thought Content: Thought content normal.        Cognition and Memory: Cognition and memory normal.        Judgment: Judgment normal.    Results for orders placed or performed during the hospital encounter of 01/01/20  CBC  Result Value Ref Range   WBC 7.4 4.0 - 10.5 K/uL   RBC 4.04 3.87 - 5.11 MIL/uL   Hemoglobin 13.2 12.0 - 15.0 g/dL   HCT 09.7 35.3 - 29.9 %   MCV 100.7 (H) 80.0 - 100.0 fL   MCH 32.7 26.0 - 34.0 pg   MCHC 32.4 30.0 - 36.0 g/dL   RDW 24.2 68.3 - 41.9 %   Platelets 419 (H) 150 - 400 K/uL   nRBC 0.0 0.0 - 0.2 %  Basic metabolic panel  Result Value Ref Range   Sodium 137 135 - 145 mmol/L   Potassium 3.9 3.5 - 5.1 mmol/L   Chloride 105 98 - 111 mmol/L   CO2 20 (L) 22 - 32 mmol/L   Glucose, Bld 107 (H) 70 - 99 mg/dL   BUN 6 6 - 20 mg/dL   Creatinine, Ser 6.22 0.44 - 1.00 mg/dL   Calcium 8.7 (L) 8.9 - 10.3 mg/dL   GFR calc non Af Amer >60 >60 mL/min   GFR calc Af Amer >60 >60 mL/min   Anion gap 12 5 - 15       Pertinent labs & imaging results that were available during my care of the patient were reviewed by me and considered in my medical decision making.  Assessment & Plan:  Jerricka was seen today for new patient (initial visit).  Diagnoses and all orders for this visit:  Primary insomnia Sleep hygiene discussed in detail. Pt aware to add melatonin to regimen, start at 3 mg and titrate up to 12 mg as needed for sleep. Pt aware to report any new, worsening, or persistent symptoms.  -     hydrOXYzine (ATARAX/VISTARIL) 50 MG tablet; Take 1 tablet (50 mg total) by mouth at bedtime as needed (insomnia).  Depression, recurrent (HCC) GAD (generalized anxiety disorder) No SI or HI. Will refill Celexa. Pt aware to report any new,  worsening, or persistent symptoms.  -     citalopram (CELEXA) 20 MG tablet; Take 1 tablet (20 mg total) by mouth daily.  Elevated blood pressure reading in office without diagnosis of hypertension Reports normal readings at home. DASH diet and exercise discussed in detail. Return in 1 month for recheck and bring BP log to visit.   Continue all other maintenance medications.  Follow up plan: Return in about 1 month (around 09/20/2021), or if symptoms worsen or fail to improve, for CPE with PAP.   Continue healthy lifestyle choices, including diet (rich in fruits, vegetables, and lean proteins, and low in salt and simple carbohydrates) and exercise (at least 30 minutes of moderate physical activity daily).  Educational handout given for insomnia, GAD  The above assessment and management plan was discussed with the patient. The patient verbalized understanding of and has agreed to the management  plan. Patient is aware to call the clinic if they develop any new symptoms or if symptoms persist or worsen. Patient is aware when to return to the clinic for a follow-up visit. Patient educated on when it is appropriate to go to the emergency department.   Monia Pouch, FNP-[facility] [phone]

## 2021-09-08 ENCOUNTER — Other Ambulatory Visit: Payer: Self-pay | Admitting: Family Medicine

## 2021-09-08 DIAGNOSIS — F5101 Primary insomnia: Secondary | ICD-10-CM

## 2021-09-23 ENCOUNTER — Encounter: Payer: 59 | Admitting: Family Medicine

## 2021-10-08 ENCOUNTER — Encounter: Payer: 59 | Admitting: Family Medicine

## 2022-12-01 ENCOUNTER — Encounter: Payer: Self-pay | Admitting: Family Medicine

## 2022-12-01 ENCOUNTER — Ambulatory Visit: Payer: Medicaid Other | Admitting: Family Medicine

## 2022-12-03 ENCOUNTER — Encounter: Payer: Self-pay | Admitting: Family Medicine

## 2022-12-08 ENCOUNTER — Ambulatory Visit: Payer: Medicaid Other | Admitting: Family Medicine

## 2022-12-23 ENCOUNTER — Ambulatory Visit: Payer: Medicaid Other | Admitting: Family Medicine

## 2022-12-23 ENCOUNTER — Encounter: Payer: Self-pay | Admitting: Family Medicine

## 2022-12-23 VITALS — BP 145/71 | HR 67 | Temp 97.9°F | Ht 65.0 in | Wt 133.0 lb

## 2022-12-23 DIAGNOSIS — E559 Vitamin D deficiency, unspecified: Secondary | ICD-10-CM

## 2022-12-23 DIAGNOSIS — F5101 Primary insomnia: Secondary | ICD-10-CM

## 2022-12-23 DIAGNOSIS — F339 Major depressive disorder, recurrent, unspecified: Secondary | ICD-10-CM

## 2022-12-23 DIAGNOSIS — F411 Generalized anxiety disorder: Secondary | ICD-10-CM | POA: Diagnosis not present

## 2022-12-23 MED ORDER — CITALOPRAM HYDROBROMIDE 20 MG PO TABS: 20.0000 mg | ORAL_TABLET | Freq: Every day | ORAL | 4 refills | Status: DC

## 2022-12-23 MED ORDER — HYDROXYZINE HCL 50 MG PO TABS: 50.0000 mg | ORAL_TABLET | Freq: Three times a day (TID) | ORAL | 1 refills | Status: DC | PRN

## 2022-12-23 NOTE — Patient Instructions (Signed)

## 2022-12-23 NOTE — Progress Notes (Signed)
Subjective:  Patient ID: Casey [name], female    DOB: September 19, 1961, 62 y.o.   MRN: [id number]  Patient Care Team: Baruch Gouty, FNP as PCP - General (Family Medicine)   Chief Complaint:  Anxiety, Depression (States she has been off the celexa for months since it did not work. ), and Panic Attack (X 1 year )   HPI: Casey [name] is a 62 y.o. female presenting on 12/23/2022 for Anxiety, Depression (States she has been off the celexa for months since it did not work. ), and Panic Attack (X 1 year )   Patient presents today for ongoing anxiety, depression, and insomnia. She was prescribed celexa and atarax which she is not taking. States she stopped taking both because she felt they were not helping but then reports she run out and just did not get them refilled. Upon further questioning, she does report it did help with her anxiety and depression some. Since stopping the medications she reports her anxiety and depression symptoms have worsened.       12/23/2022   12:35 PM 08/20/2021   11:05 AM  Depression screen PHQ 2/9  Decreased Interest 3 1  Down, Depressed, Hopeless 3 1  PHQ - 2 Score 6 2  Altered sleeping 3 3  Tired, decreased energy 3 2  Change in appetite 3 3  Feeling bad or failure about yourself  3 2  Trouble concentrating 3 1  Moving slowly or fidgety/restless 3 1  Suicidal thoughts 0 0  PHQ-9 Score 24 14  Difficult doing work/chores Somewhat difficult Not difficult at all      12/23/2022   12:35 PM 08/20/2021   11:06 AM  GAD 7 : Generalized Anxiety Score  Nervous, Anxious, on Edge 3 2  Control/stop worrying 3 2  Worry too much - different things 3 2  Trouble relaxing 3 2  Restless 2 1  Easily annoyed or irritable 0 1  Afraid - awful might happen 3 1  Total GAD 7 Score 17 11  Anxiety Difficulty Somewhat difficult Not difficult at all        Relevant past medical, surgical, family, and social history reviewed and updated as indicated.  Allergies and  medications reviewed and updated. Data reviewed: Chart in Epic.   Past Medical History:  Diagnosis Date   Anxiety    Chronic back pain    Depression    History of panic attacks 09/11/2014   Medical history non-contributory    Ruptured disc, thoracic    Suicide attempt ([place]) 2002    Past Surgical History:  Procedure Laterality Date   CESAREAN SECTION     CLAVICLE SURGERY     REVERSE SHOULDER ARTHROPLASTY Left 01/01/2020   Procedure: LEFT REVERSE SHOULDER ARTHROPLASTY;  Surgeon: Meredith Pel, MD;  Location: [facility];  Service: Orthopedics;  Laterality: Left;   TUBAL LIGATION      Social History   Socioeconomic History   Marital status: Single    Spouse name: Not on file   Number of children: Not on file   Years of education: Not on file   Highest education level: Not on file  Occupational History   Not on file  Tobacco Use   Smoking status: Every Day    Packs/day: 0.25    Types: Cigarettes   Smokeless tobacco: Never   Tobacco comments:    2-3 cigarettes per day  Vaping Use   Vaping Use: Never used  Substance and Sexual  Activity   Alcohol use: Yes    Alcohol/week: 1.0 standard drink of alcohol    Types: 1 Cans of beer per week    Comment: occ    Drug use: Never    Types: "Crack" cocaine, Benzodiazepines   Sexual activity: Not Currently    Birth control/protection: Post-menopausal  Other Topics Concern   Not on file  Social History Narrative   Not on file   Social Determinants of Health   Financial Resource Strain: Not on file  Food Insecurity: Not on file  Transportation Needs: Not on file  Physical Activity: Not on file  Stress: Not on file  Social Connections: Not on file  Intimate Partner Violence: Not on file    Outpatient Encounter Medications as of 12/23/2022  Medication Sig   hydrOXYzine (ATARAX) 50 MG tablet Take 1 tablet (50 mg total) by mouth 3 (three) times daily as needed.   citalopram (CELEXA) 20 MG tablet Take 1 tablet (20 mg total) by  mouth daily.   [DISCONTINUED] calcium-vitamin D (OSCAL WITH D) 500-200 MG-UNIT tablet Take 1 tablet by mouth daily with breakfast. For bone health   [DISCONTINUED] citalopram (CELEXA) 20 MG tablet Take 1 tablet (20 mg total) by mouth daily.   [DISCONTINUED] potassium chloride (KLOR-CON) 10 MEQ tablet Take 1 tablet (10 mEq total) by mouth daily for 5 days.   No facility-administered encounter medications on file as of 12/23/2022.    Allergies  Allergen Reactions   Ampicillin Other (See Comments)    Reaction is unknown    Review of Systems  Constitutional:  Positive for activity change, appetite change and fatigue. Negative for chills, diaphoresis, fever and unexpected weight change.  HENT: Negative.    Eyes: Negative.   Respiratory:  Negative for cough, chest tightness and shortness of breath.   Cardiovascular:  Negative for chest pain, palpitations and leg swelling.  Gastrointestinal:  Negative for abdominal pain, blood in stool, constipation, diarrhea, nausea and vomiting.  Endocrine: Negative.   Genitourinary:  Negative for decreased urine volume, difficulty urinating, dysuria, frequency, urgency and vaginal bleeding.  Musculoskeletal:  Negative for arthralgias and myalgias.  Skin: Negative.   Allergic/Immunologic: Negative.   Neurological:  Negative for dizziness, tremors, seizures, syncope, facial asymmetry, speech difficulty, weakness, light-headedness, numbness and headaches.  Hematological: Negative.   Psychiatric/Behavioral:  Positive for agitation, decreased concentration, dysphoric mood and sleep disturbance. Negative for behavioral problems, confusion, hallucinations, self-injury and suicidal ideas. The patient is nervous/anxious. The patient is not hyperactive.   All other systems reviewed and are negative.       Objective:  BP (!) 145/71   Pulse 67   Temp 97.9 F (36.6 C) (Temporal)   Ht 5' 5"$  (1.651 m)   Wt 133 lb (60.3 kg)   LMP  (LMP Unknown)   SpO2 100%    BMI 22.13 kg/m    Wt Readings from Last 3 Encounters:  12/23/22 133 lb (60.3 kg)  08/20/21 140 lb 9.6 oz (63.8 kg)  04/20/20 142 lb (64.4 kg)    Physical Exam Vitals and nursing note reviewed.  Constitutional:      General: She is not in acute distress.    Appearance: Normal appearance. She is well-developed, well-groomed and normal weight. She is not ill-appearing, toxic-appearing or diaphoretic.  HENT:     Head: Normocephalic and atraumatic.     Jaw: There is normal jaw occlusion.     Right Ear: Hearing normal.     Left Ear: Hearing normal.  Nose: Nose normal.     Mouth/Throat:     Lips: Pink.     Mouth: Mucous membranes are moist.     Pharynx: Oropharynx is clear. Uvula midline.  Eyes:     General: Lids are normal.     Extraocular Movements: Extraocular movements intact.     Conjunctiva/sclera: Conjunctivae normal.     Pupils: Pupils are equal, round, and reactive to light.  Neck:     Thyroid: No thyroid mass, thyromegaly or thyroid tenderness.     Vascular: No carotid bruit or JVD.     Trachea: Trachea and phonation normal.  Cardiovascular:     Rate and Rhythm: Normal rate and regular rhythm.     Chest Wall: PMI is not displaced.     Pulses: Normal pulses.     Heart sounds: Normal heart sounds. No murmur heard.    No friction rub. No gallop.  Pulmonary:     Effort: Pulmonary effort is normal. No respiratory distress.     Breath sounds: Normal breath sounds. No wheezing.  Abdominal:     General: Bowel sounds are normal. There is no distension or abdominal bruit.     Palpations: Abdomen is soft. There is no hepatomegaly or splenomegaly.     Tenderness: There is no abdominal tenderness. There is no right CVA tenderness or left CVA tenderness.     Hernia: No hernia is present.  Musculoskeletal:        General: Normal range of motion.     Cervical back: Normal range of motion and neck supple.     Right lower leg: No edema.     Left lower leg: No edema.   Lymphadenopathy:     Cervical: No cervical adenopathy.  Skin:    General: Skin is warm and dry.     Capillary Refill: Capillary refill takes less than 2 seconds.     Coloration: Skin is not cyanotic, jaundiced or pale.     Findings: No rash.  Neurological:     General: No focal deficit present.     Mental Status: She is alert and oriented to person, place, and time.     Sensory: Sensation is intact.     Motor: Motor function is intact.     Coordination: Coordination is intact.     Gait: Gait is intact.     Deep Tendon Reflexes: Reflexes are normal and symmetric.  Psychiatric:        Attention and Perception: Attention and perception normal.        Mood and Affect: Mood and affect normal.        Speech: Speech normal.        Behavior: Behavior normal. Behavior is cooperative.        Thought Content: Thought content normal.        Cognition and Memory: Cognition and memory normal.        Judgment: Judgment normal.     Results for orders placed or performed during the hospital encounter of 01/01/20  CBC  Result Value Ref Range   WBC 7.4 4.0 - 10.5 K/uL   RBC 4.04 3.87 - 5.11 MIL/uL   Hemoglobin 13.2 12.0 - 15.0 g/dL   HCT 40.7 36.0 - 46.0 %   MCV 100.7 (H) 80.0 - 100.0 fL   MCH 32.7 26.0 - 34.0 pg   MCHC 32.4 30.0 - 36.0 g/dL   RDW 13.8 11.5 - 15.5 %   Platelets 419 (H) 150 - 400 K/uL  nRBC 0.0 0.0 - 0.2 %  Basic metabolic panel  Result Value Ref Range   Sodium 137 135 - 145 mmol/L   Potassium 3.9 3.5 - 5.1 mmol/L   Chloride 105 98 - 111 mmol/L   CO2 20 (L) 22 - 32 mmol/L   Glucose, Bld 107 (H) 70 - 99 mg/dL   BUN 6 6 - 20 mg/dL   Creatinine, Ser 0.57 0.44 - 1.00 mg/dL   Calcium 8.7 (L) 8.9 - 10.3 mg/dL   GFR calc non Af Amer >60 >60 mL/min   GFR calc Af Amer >60 >60 mL/min   Anion gap 12 5 - 15       Pertinent labs & imaging results that were available during my care of the patient were reviewed by me and considered in my medical decision making.  Assessment  & Plan:  Mkenna was seen today for anxiety, depression and panic attack.  Diagnoses and all orders for this visit:  Depression, recurrent ([place]) GAD (generalized anxiety disorder) Primary insomnia Will restart prior medications and refer to psychiatry. Denies SI or HI. Crisis intervention hotline numbers provided. Pt aware to report new, worsening, or persistent symptoms. Follow up in 8 weeks, sooner if unable to get in with psychiatry.  -     Ambulatory referral to Psychiatry -     hydrOXYzine (ATARAX) 50 MG tablet; Take 1 tablet (50 mg total) by mouth 3 (three) times daily as needed. -     CBC with Differential/Platelet -     CMP14+EGFR -     Thyroid Panel With TSH -     VITAMIN D 25 Hydroxy (Vit-D Deficiency, Fractures)  Vitamin D deficiency No on repletion therapy. Will check labs and restart therapy if warranted.  -     CBC with Differential/Platelet -     VITAMIN D 25 Hydroxy (Vit-D Deficiency, Fractures)     Continue all other maintenance medications.  Follow up plan: Return in 8 weeks (on 02/17/2023), or if symptoms worsen or fail to improve, for GAD.   Continue healthy lifestyle choices, including diet (rich in fruits, vegetables, and lean proteins, and low in salt and simple carbohydrates) and exercise (at least 30 minutes of moderate physical activity daily).  Educational handout given for crisis intervention hotline numbers  The above assessment and management plan was discussed with the patient. The patient verbalized understanding of and has agreed to the management plan. Patient is aware to call the clinic if they develop any new symptoms or if symptoms persist or worsen. Patient is aware when to return to the clinic for a follow-up visit. Patient educated on when it is appropriate to go to the emergency department.   Monia Pouch, FNP-C [facility] [phone]

## 2022-12-24 LAB — CBC WITH DIFFERENTIAL/PLATELET
Basophils Absolute: 0 10*3/uL (ref 0.0–0.2)
Basos: 0 %
EOS (ABSOLUTE): 0.1 10*3/uL (ref 0.0–0.4)
Eos: 1 %
Hematocrit: 40.2 % (ref 34.0–46.6)
Hemoglobin: 13.7 g/dL (ref 11.1–15.9)
Immature Grans (Abs): 0 10*3/uL (ref 0.0–0.1)
Immature Granulocytes: 0 %
Lymphocytes Absolute: 2.8 10*3/uL (ref 0.7–3.1)
Lymphs: 31 %
MCH: 30.9 pg (ref 26.6–33.0)
MCHC: 34.1 g/dL (ref 31.5–35.7)
MCV: 91 fL (ref 79–97)
Monocytes Absolute: 0.5 10*3/uL (ref 0.1–0.9)
Monocytes: 5 %
Neutrophils Absolute: 5.7 10*3/uL (ref 1.4–7.0)
Neutrophils: 63 %
Platelets: 449 10*3/uL (ref 150–450)
RBC: 4.44 x10E6/uL (ref 3.77–5.28)
RDW: 13.1 % (ref 11.7–15.4)
WBC: 9.1 10*3/uL (ref 3.4–10.8)

## 2022-12-24 LAB — CMP14+EGFR
ALT: 9 IU/L (ref 0–32)
AST: 6 IU/L (ref 0–40)
Albumin/Globulin Ratio: 1.5 (ref 1.2–2.2)
Albumin: 4.5 g/dL (ref 3.9–4.9)
Alkaline Phosphatase: 117 IU/L (ref 44–121)
BUN/Creatinine Ratio: 13 (ref 12–28)
BUN: 7 mg/dL — ABNORMAL LOW (ref 8–27)
Bilirubin Total: 0.2 mg/dL (ref 0.0–1.2)
CO2: 20 mmol/L (ref 20–29)
Calcium: 9.9 mg/dL (ref 8.7–10.3)
Chloride: 105 mmol/L (ref 96–106)
Creatinine, Ser: 0.55 mg/dL — ABNORMAL LOW (ref 0.57–1.00)
Globulin, Total: 3.1 g/dL (ref 1.5–4.5)
Glucose: 105 mg/dL — ABNORMAL HIGH (ref 70–99)
Potassium: 4.2 mmol/L (ref 3.5–5.2)
Sodium: 142 mmol/L (ref 134–144)
Total Protein: 7.6 g/dL (ref 6.0–8.5)
eGFR: 103 mL/min/{1.73_m2} (ref >59)

## 2022-12-24 LAB — THYROID PANEL WITH TSH
Free Thyroxine Index: 1.7 (ref 1.2–4.9)
T3 Uptake Ratio: 27 % (ref 24–39)
T4, Total: 6.3 ug/dL (ref 4.5–12.0)
TSH: 0.234 u[IU]/mL — ABNORMAL LOW (ref 0.450–4.500)

## 2022-12-24 LAB — VITAMIN D 25 HYDROXY (VIT D DEFICIENCY, FRACTURES): Vit D, 25-Hydroxy: 10.4 ng/mL — ABNORMAL LOW (ref 30.0–100.0)

## 2022-12-24 MED ORDER — VITAMIN D (ERGOCALCIFEROL) 1.25 MG (50000 UNIT) PO CAPS: 50000.0000 [IU] | ORAL_CAPSULE | ORAL | 3 refills | Status: DC

## 2022-12-24 NOTE — Addendum Note (Signed)
Addended by: Baruch Gouty on: 12/24/2022 08:19 AM   Modules accepted: Orders

## 2022-12-28 ENCOUNTER — Telehealth: Payer: Self-pay | Admitting: Family Medicine

## 2022-12-28 NOTE — Telephone Encounter (Signed)
Pt wants referral to see a psychiatrist. Pt aware no order yet. Pt says that she talked to [facility] about this a previous visit. She would like to go to [place] or somwhere close to home.

## 2022-12-29 NOTE — Telephone Encounter (Signed)
Patient aware and verbalizes understanding. 

## 2022-12-29 NOTE — Addendum Note (Signed)
Addended by: Baruch Gouty on: 12/29/2022 09:49 AM   Modules accepted: Orders

## 2023-01-15 ENCOUNTER — Other Ambulatory Visit: Payer: Self-pay | Admitting: Family Medicine

## 2023-01-15 DIAGNOSIS — F339 Major depressive disorder, recurrent, unspecified: Secondary | ICD-10-CM

## 2023-01-15 DIAGNOSIS — F411 Generalized anxiety disorder: Secondary | ICD-10-CM

## 2023-01-18 ENCOUNTER — Other Ambulatory Visit: Payer: Self-pay | Admitting: Family Medicine

## 2023-01-18 DIAGNOSIS — F5101 Primary insomnia: Secondary | ICD-10-CM

## 2023-01-18 DIAGNOSIS — F339 Major depressive disorder, recurrent, unspecified: Secondary | ICD-10-CM

## 2023-01-18 DIAGNOSIS — F411 Generalized anxiety disorder: Secondary | ICD-10-CM

## 2023-02-17 ENCOUNTER — Ambulatory Visit: Payer: Medicaid Other | Admitting: Family Medicine

## 2023-02-19 ENCOUNTER — Encounter: Payer: Self-pay | Admitting: Family Medicine

## 2023-02-22 NOTE — Patient Instructions (Incomplete)
Your provider wants you to schedule an appointment with a Psychologist/Psychiatrist. The following list of offices requires the patient to call and make their own appointment, as there is information they need that only you can provide. Please feel free to choose form the following providers:  [facility]   [phone] Crisis Recovery in [place] [phone]  [facility]  [phone]   [address] [place], [place]  (Scheduled through [facility]) Must call and do an interview for appointment. Sees Children / Accepts Medicaid  [facility]    [phone]  [address], [address]    [place], [place]       [facility]  [phone] [address] [place], [place]  Evaluates for Autism but does not treat it Sees Children / Accepts Medicaid  [facility]    [phone] [address]   [place], [place] Medication management, substance abuse, bipolar, grief, family, marriage, OCD, anxiety, PTSD Sees children / Accepts Medicaid  [facility]    [phone] [address] [place], [place] Sees children / Accepts Medicaid  [facility]  [phone] [address] [place], [place]   Dr Akinlayo     [phone] [address] [place], [place]  Sees ADD & ADHD for treatment Accepts Medicaid  [facility]  [phone] [address] [place], [place] Evaluates for Autism Accepts Medicaid  [facility]  [phone] [address] [place], [place]  Does Adult ADD evaluations Does not accept Medicaid  [facility]   [phone] [address]   [place],  Uses animal therapy  Sees children as young as 3 years old Accepts Medicaid  [facility]     [phone]    [address]  [place],  [address] Sees children Accepts Medicaid  

## 2023-02-24 ENCOUNTER — Ambulatory Visit: Payer: Medicaid Other | Admitting: Family Medicine

## 2023-02-24 ENCOUNTER — Encounter: Payer: Self-pay | Admitting: Family Medicine

## 2023-02-24 VITALS — BP 139/66 | HR 63 | Temp 97.7°F | Ht 65.0 in | Wt 134.2 lb

## 2023-02-24 DIAGNOSIS — F339 Major depressive disorder, recurrent, unspecified: Secondary | ICD-10-CM | POA: Diagnosis not present

## 2023-02-24 DIAGNOSIS — F411 Generalized anxiety disorder: Secondary | ICD-10-CM

## 2023-02-24 NOTE — Progress Notes (Signed)
Subjective:  Patient ID: Casey [name], female    DOB: 1961/05/18, 62 y.o.   MRN: [id number]  Patient Care Team: Sonny Masters, FNP as PCP - General (Family Medicine)   Chief Complaint:  Anxiety (8 week follow up- states that anxiety is better but still having panic attacks )   HPI: Casey [name] is a 62 y.o. female presenting on 02/24/2023 for Anxiety (8 week follow up- states that anxiety is better but still having panic attacks )   1. Depression, recurrent 2. GAD (generalized anxiety disorder) Pt presents today for anxiety and depression follow up. States she has been taking her medications as prescribed but is still having 2-3 anxiety attacks per week. She was referred to psychiatry and has not followed up because the appointment was in [facility], states she does not want to drive to [employer]. Would like new referral today. She denies SI or HI.     02/24/2023   11:59 AM 12/23/2022   12:35 PM 08/20/2021   11:06 AM  GAD 7 : Generalized Anxiety Score  Nervous, Anxious, on Edge 1 3 2   Control/stop worrying 2 3 2   Worry too much - different things 1 3 2   Trouble relaxing 1 3 2   Restless 0 2 1  Easily annoyed or irritable 0 0 1  Afraid - awful might happen 1 3 1   Total GAD 7 Score 6 17 11   Anxiety Difficulty Somewhat difficult Somewhat difficult Not difficult at all       02/24/2023   11:58 AM 12/23/2022   12:35 PM 08/20/2021   11:05 AM  Depression screen PHQ 2/9  Decreased Interest 2 3 1   Down, Depressed, Hopeless 1 3 1   PHQ - 2 Score 3 6 2   Altered sleeping 1 3 3   Tired, decreased energy 1 3 2   Change in appetite 1 3 3   Feeling bad or failure about yourself  1 3 2   Trouble concentrating 1 3 1   Moving slowly or fidgety/restless 0 3 1  Suicidal thoughts 0 0 0  PHQ-9 Score 8 24 14   Difficult doing work/chores Somewhat difficult Somewhat difficult Not difficult at all        Relevant past medical, surgical, family, and social history reviewed and updated as  indicated.  Allergies and medications reviewed and updated. Data reviewed: Chart in Epic.   Past Medical History:  Diagnosis Date   Anxiety    Chronic back pain    Depression    History of panic attacks 09/11/2014   Medical history non-contributory    Ruptured disc, thoracic    Suicide attempt 2002    Past Surgical History:  Procedure Laterality Date   CESAREAN SECTION     CLAVICLE SURGERY     REVERSE SHOULDER ARTHROPLASTY Left 01/01/2020   Procedure: LEFT REVERSE SHOULDER ARTHROPLASTY;  Surgeon: Cammy Copa, MD;  Location: [facility];  Service: Orthopedics;  Laterality: Left;   TUBAL LIGATION      Social History   Socioeconomic History   Marital status: Single    Spouse name: Not on file   Number of children: Not on file   Years of education: Not on file   Highest education level: Not on file  Occupational History   Not on file  Tobacco Use   Smoking status: Every Day    Packs/day: .25    Types: Cigarettes   Smokeless tobacco: Never   Tobacco comments:    2-3 cigarettes per  day  Vaping Use   Vaping Use: Never used  Substance and Sexual Activity   Alcohol use: Yes    Alcohol/week: 1.0 standard drink of alcohol    Types: 1 Cans of beer per week    Comment: occ    Drug use: Never    Types: "Crack" cocaine, Benzodiazepines   Sexual activity: Not Currently    Birth control/protection: Post-menopausal  Other Topics Concern   Not on file  Social History Narrative   Not on file   Social Determinants of Health   Financial Resource Strain: Not on file  Food Insecurity: Not on file  Transportation Needs: Not on file  Physical Activity: Not on file  Stress: Not on file  Social Connections: Not on file  Intimate Partner Violence: Not on file    Outpatient Encounter Medications as of 02/24/2023  Medication Sig   citalopram (CELEXA) 20 MG tablet TAKE 1 TABLET BY MOUTH EVERY DAY   hydrOXYzine (ATARAX) 50 MG tablet TAKE 1 TABLET BY MOUTH 3 TIMES DAILY AS NEEDED.    Vitamin D, Ergocalciferol, (DRISDOL) 1.25 MG (50000 UNIT) CAPS capsule Take 1 capsule (50,000 Units total) by mouth every 7 (seven) days.   No facility-administered encounter medications on file as of 02/24/2023.    Allergies  Allergen Reactions   Ampicillin Other (See Comments)    Reaction is unknown    Review of Systems  Constitutional:  Positive for activity change, appetite change and fatigue. Negative for chills, diaphoresis, fever and unexpected weight change.  Psychiatric/Behavioral:  Positive for sleep disturbance. Negative for agitation, behavioral problems, confusion, decreased concentration, dysphoric mood, hallucinations, self-injury and suicidal ideas. The patient is nervous/anxious. The patient is not hyperactive.   All other systems reviewed and are negative.       Objective:  BP 139/66   Pulse 63   Temp 97.7 F (36.5 C) (Temporal)   Ht  (1.651 m)   Wt 134 lb 3.2 oz (60.9 kg)   LMP  (LMP Unknown)   SpO2 93%   BMI 22.33 kg/m    Wt Readings from Last 3 Encounters:  02/24/23 134 lb 3.2 oz (60.9 kg)  12/23/22 133 lb (60.3 kg)  08/20/21 140 lb 9.6 oz (63.8 kg)    Physical Exam Vitals and nursing note reviewed.  Constitutional:      Appearance: Normal appearance. She is normal weight.  HENT:     Head: Normocephalic and atraumatic.     Mouth/Throat:     Mouth: Mucous membranes are moist.  Eyes:     Pupils: Pupils are equal, round, and reactive to light.  Cardiovascular:     Rate and Rhythm: Normal rate.  Pulmonary:     Effort: Pulmonary effort is normal.  Musculoskeletal:     Cervical back: Neck supple.     Right lower leg: No edema.     Left lower leg: No edema.  Skin:    General: Skin is warm and dry.     Capillary Refill: Capillary refill takes less than 2 seconds.  Neurological:     General: No focal deficit present.     Mental Status: She is alert and oriented to person, place, and time.  Psychiatric:        Mood and Affect: Mood  normal.        Behavior: Behavior normal.        Thought Content: Thought content normal.        Judgment: Judgment normal.  Results for orders placed or performed in visit on 12/23/22  CBC with Differential/Platelet  Result Value Ref Range   WBC 9.1 3.4 - 10.8 x10E3/uL   RBC 4.44 3.77 - 5.28 x10E6/uL   Hemoglobin 13.7 11.1 - 15.9 g/dL   Hematocrit 16.1 09.6 - 46.6 %   MCV 91 79 - 97 fL   MCH 30.9 26.6 - 33.0 pg   MCHC 34.1 31.5 - 35.7 g/dL   RDW 04.5 40.9 - 81.1 %   Platelets 449 150 - 450 x10E3/uL   Neutrophils 63 Not Estab. %   Lymphs 31 Not Estab. %   Monocytes 5 Not Estab. %   Eos 1 Not Estab. %   Basos 0 Not Estab. %   Neutrophils Absolute 5.7 1.4 - 7.0 x10E3/uL   Lymphocytes Absolute 2.8 0.7 - 3.1 x10E3/uL   Monocytes Absolute 0.5 0.1 - 0.9 x10E3/uL   EOS (ABSOLUTE) 0.1 0.0 - 0.4 x10E3/uL   Basophils Absolute 0.0 0.0 - 0.2 x10E3/uL   Immature Granulocytes 0 Not Estab. %   Immature Grans (Abs) 0.0 0.0 - 0.1 x10E3/uL  CMP14+EGFR  Result Value Ref Range   Glucose 105 (H) 70 - 99 mg/dL   BUN 7 (L) 8 - 27 mg/dL   Creatinine, Ser 9.14 (L) 0.57 - 1.00 mg/dL   eGFR 782 >95 AO/ZHY/8.65   BUN/Creatinine Ratio 13 12 - 28   Sodium 142 134 - 144 mmol/L   Potassium 4.2 3.5 - 5.2 mmol/L   Chloride 105 96 - 106 mmol/L   CO2 20 20 - 29 mmol/L   Calcium 9.9 8.7 - 10.3 mg/dL   Total Protein 7.6 6.0 - 8.5 g/dL   Albumin 4.5 3.9 - 4.9 g/dL   Globulin, Total 3.1 1.5 - 4.5 g/dL   Albumin/Globulin Ratio 1.5 1.2 - 2.2   Bilirubin Total 0.2 0.0 - 1.2 mg/dL   Alkaline Phosphatase 117 44 - 121 IU/L   AST 6 0 - 40 IU/L   ALT 9 0 - 32 IU/L  Thyroid Panel With TSH  Result Value Ref Range   TSH 0.234 (L) 0.450 - 4.500 uIU/mL   T4, Total 6.3 4.5 - 12.0 ug/dL   T3 Uptake Ratio 27 24 - 39 %   Free Thyroxine Index 1.7 1.2 - 4.9  VITAMIN D 25 Hydroxy (Vit-D Deficiency, Fractures)  Result Value Ref Range   Vit D, 25-Hydroxy 10.4 (L) 30.0 - 100.0 ng/mL       Pertinent labs & imaging  results that were available during my care of the patient were reviewed by me and considered in my medical decision making.  Assessment & Plan:  Casey [name] was seen today for anxiety.  Diagnoses and all orders for this visit:  Depression, recurrent GAD (generalized anxiety disorder) No SI or HI, has not followed up with psychiatry, will place new referral today. Aware to continue current medications.  -     Ambulatory referral to Psychiatry     Continue all other maintenance medications.  Follow up plan: Return if symptoms worsen or fail to improve.   Continue healthy lifestyle choices, including diet (rich in fruits, vegetables, and lean proteins, and low in salt and simple carbohydrates) and exercise (at least 30 minutes of moderate physical activity daily).  Educational handout given for psychiatric resources   The above assessment and management plan was discussed with the patient. The patient verbalized understanding of and has agreed to the management plan. Patient is aware to call the clinic if they  develop any new symptoms or if symptoms persist or worsen. Patient is aware when to return to the clinic for a follow-up visit. Patient educated on when it is appropriate to go to the emergency department.   Monia Pouch, FNP-C [facility] [phone]

## 2023-02-28 ENCOUNTER — Other Ambulatory Visit: Payer: Self-pay | Admitting: Family Medicine

## 2023-02-28 DIAGNOSIS — F411 Generalized anxiety disorder: Secondary | ICD-10-CM

## 2023-02-28 DIAGNOSIS — F339 Major depressive disorder, recurrent, unspecified: Secondary | ICD-10-CM

## 2023-03-07 ENCOUNTER — Other Ambulatory Visit: Payer: Self-pay | Admitting: Family Medicine

## 2023-03-07 DIAGNOSIS — F411 Generalized anxiety disorder: Secondary | ICD-10-CM

## 2023-03-07 DIAGNOSIS — F5101 Primary insomnia: Secondary | ICD-10-CM

## 2023-03-07 DIAGNOSIS — F339 Major depressive disorder, recurrent, unspecified: Secondary | ICD-10-CM

## 2023-03-10 ENCOUNTER — Encounter: Payer: Self-pay | Admitting: Family Medicine

## 2023-03-26 ENCOUNTER — Other Ambulatory Visit: Payer: Self-pay | Admitting: Family Medicine

## 2023-03-26 DIAGNOSIS — F339 Major depressive disorder, recurrent, unspecified: Secondary | ICD-10-CM

## 2023-03-26 DIAGNOSIS — F5101 Primary insomnia: Secondary | ICD-10-CM

## 2023-03-26 DIAGNOSIS — F411 Generalized anxiety disorder: Secondary | ICD-10-CM

## 2023-04-18 ENCOUNTER — Other Ambulatory Visit: Payer: Self-pay | Admitting: Family Medicine

## 2023-04-18 DIAGNOSIS — F339 Major depressive disorder, recurrent, unspecified: Secondary | ICD-10-CM

## 2023-04-18 DIAGNOSIS — F411 Generalized anxiety disorder: Secondary | ICD-10-CM

## 2023-04-18 DIAGNOSIS — F5101 Primary insomnia: Secondary | ICD-10-CM

## 2023-05-04 ENCOUNTER — Ambulatory Visit (HOSPITAL_COMMUNITY): Payer: No Typology Code available for payment source | Admitting: Psychiatry

## 2023-05-28 ENCOUNTER — Ambulatory Visit: Payer: Medicaid Other | Admitting: Family Medicine

## 2023-06-03 ENCOUNTER — Ambulatory Visit: Payer: MEDICAID | Admitting: Family Medicine

## 2023-06-03 ENCOUNTER — Encounter: Payer: Self-pay | Admitting: Family Medicine

## 2023-06-08 ENCOUNTER — Other Ambulatory Visit: Payer: Self-pay | Admitting: Family Medicine

## 2023-06-08 DIAGNOSIS — F411 Generalized anxiety disorder: Secondary | ICD-10-CM

## 2023-06-08 DIAGNOSIS — F5101 Primary insomnia: Secondary | ICD-10-CM

## 2023-06-08 DIAGNOSIS — F339 Major depressive disorder, recurrent, unspecified: Secondary | ICD-10-CM

## 2023-06-12 ENCOUNTER — Other Ambulatory Visit: Payer: Self-pay | Admitting: Family Medicine

## 2023-06-12 DIAGNOSIS — E559 Vitamin D deficiency, unspecified: Secondary | ICD-10-CM

## 2023-06-25 ENCOUNTER — Inpatient Hospital Stay (HOSPITAL_COMMUNITY)
Admission: EM | Admit: 2023-06-25 | Discharge: 2023-07-12 | DRG: 870 | Disposition: A | Discharge status: Home Health | Payer: MEDICAID | Attending: Internal Medicine | Admitting: Internal Medicine

## 2023-06-25 ENCOUNTER — Other Ambulatory Visit: Payer: Self-pay

## 2023-06-25 ENCOUNTER — Encounter (HOSPITAL_COMMUNITY): Payer: Self-pay | Admitting: Emergency Medicine

## 2023-06-25 ENCOUNTER — Emergency Department (HOSPITAL_COMMUNITY): Payer: MEDICAID

## 2023-06-25 DIAGNOSIS — Z79899 Other long term (current) drug therapy: Secondary | ICD-10-CM

## 2023-06-25 DIAGNOSIS — R0682 Tachypnea, not elsewhere classified: Secondary | ICD-10-CM | POA: Diagnosis not present

## 2023-06-25 DIAGNOSIS — E876 Hypokalemia: Secondary | ICD-10-CM | POA: Diagnosis present

## 2023-06-25 DIAGNOSIS — R112 Nausea with vomiting, unspecified: Secondary | ICD-10-CM

## 2023-06-25 DIAGNOSIS — R54 Age-related physical debility: Secondary | ICD-10-CM | POA: Diagnosis present

## 2023-06-25 DIAGNOSIS — D649 Anemia, unspecified: Secondary | ICD-10-CM

## 2023-06-25 DIAGNOSIS — Z781 Physical restraint status: Secondary | ICD-10-CM

## 2023-06-25 DIAGNOSIS — J9601 Acute respiratory failure with hypoxia: Secondary | ICD-10-CM | POA: Insufficient documentation

## 2023-06-25 DIAGNOSIS — Z818 Family history of other mental and behavioral disorders: Secondary | ICD-10-CM

## 2023-06-25 DIAGNOSIS — F410 Panic disorder [episodic paroxysmal anxiety] without agoraphobia: Secondary | ICD-10-CM | POA: Diagnosis present

## 2023-06-25 DIAGNOSIS — R6520 Severe sepsis without septic shock: Secondary | ICD-10-CM | POA: Diagnosis present

## 2023-06-25 DIAGNOSIS — E440 Moderate protein-calorie malnutrition: Secondary | ICD-10-CM | POA: Insufficient documentation

## 2023-06-25 DIAGNOSIS — R4587 Impulsiveness: Secondary | ICD-10-CM | POA: Diagnosis not present

## 2023-06-25 DIAGNOSIS — W06XXXA Fall from bed, initial encounter: Secondary | ICD-10-CM | POA: Diagnosis not present

## 2023-06-25 DIAGNOSIS — D75839 Thrombocytosis, unspecified: Secondary | ICD-10-CM | POA: Diagnosis not present

## 2023-06-25 DIAGNOSIS — Z9151 Personal history of suicidal behavior: Secondary | ICD-10-CM

## 2023-06-25 DIAGNOSIS — Z96612 Presence of left artificial shoulder joint: Secondary | ICD-10-CM | POA: Diagnosis present

## 2023-06-25 DIAGNOSIS — F1410 Cocaine abuse, uncomplicated: Secondary | ICD-10-CM | POA: Diagnosis present

## 2023-06-25 DIAGNOSIS — F19129 Other psychoactive substance abuse with intoxication, unspecified: Secondary | ICD-10-CM | POA: Diagnosis present

## 2023-06-25 DIAGNOSIS — Z880 Allergy status to penicillin: Secondary | ICD-10-CM

## 2023-06-25 DIAGNOSIS — F339 Major depressive disorder, recurrent, unspecified: Secondary | ICD-10-CM

## 2023-06-25 DIAGNOSIS — R0902 Hypoxemia: Principal | ICD-10-CM

## 2023-06-25 DIAGNOSIS — J441 Chronic obstructive pulmonary disease with (acute) exacerbation: Secondary | ICD-10-CM

## 2023-06-25 DIAGNOSIS — D6489 Other specified anemias: Secondary | ICD-10-CM | POA: Diagnosis not present

## 2023-06-25 DIAGNOSIS — E8770 Fluid overload, unspecified: Secondary | ICD-10-CM | POA: Diagnosis present

## 2023-06-25 DIAGNOSIS — J90 Pleural effusion, not elsewhere classified: Secondary | ICD-10-CM | POA: Diagnosis present

## 2023-06-25 DIAGNOSIS — G894 Chronic pain syndrome: Secondary | ICD-10-CM | POA: Diagnosis present

## 2023-06-25 DIAGNOSIS — F5101 Primary insomnia: Secondary | ICD-10-CM

## 2023-06-25 DIAGNOSIS — M549 Dorsalgia, unspecified: Secondary | ICD-10-CM | POA: Diagnosis present

## 2023-06-25 DIAGNOSIS — R001 Bradycardia, unspecified: Secondary | ICD-10-CM | POA: Diagnosis not present

## 2023-06-25 DIAGNOSIS — Z8249 Family history of ischemic heart disease and other diseases of the circulatory system: Secondary | ICD-10-CM

## 2023-06-25 DIAGNOSIS — A419 Sepsis, unspecified organism: Principal | ICD-10-CM | POA: Diagnosis present

## 2023-06-25 DIAGNOSIS — R1111 Vomiting without nausea: Secondary | ICD-10-CM

## 2023-06-25 DIAGNOSIS — Z6822 Body mass index (BMI) 22.0-22.9, adult: Secondary | ICD-10-CM

## 2023-06-25 DIAGNOSIS — E43 Unspecified severe protein-calorie malnutrition: Secondary | ICD-10-CM | POA: Insufficient documentation

## 2023-06-25 DIAGNOSIS — F1910 Other psychoactive substance abuse, uncomplicated: Secondary | ICD-10-CM | POA: Insufficient documentation

## 2023-06-25 DIAGNOSIS — J690 Pneumonitis due to inhalation of food and vomit: Secondary | ICD-10-CM | POA: Diagnosis present

## 2023-06-25 DIAGNOSIS — R1312 Dysphagia, oropharyngeal phase: Secondary | ICD-10-CM | POA: Diagnosis present

## 2023-06-25 DIAGNOSIS — J189 Pneumonia, unspecified organism: Secondary | ICD-10-CM

## 2023-06-25 DIAGNOSIS — R410 Disorientation, unspecified: Secondary | ICD-10-CM

## 2023-06-25 DIAGNOSIS — F411 Generalized anxiety disorder: Secondary | ICD-10-CM

## 2023-06-25 DIAGNOSIS — J440 Chronic obstructive pulmonary disease with acute lower respiratory infection: Secondary | ICD-10-CM | POA: Diagnosis present

## 2023-06-25 DIAGNOSIS — F17210 Nicotine dependence, cigarettes, uncomplicated: Secondary | ICD-10-CM | POA: Diagnosis present

## 2023-06-25 DIAGNOSIS — R739 Hyperglycemia, unspecified: Secondary | ICD-10-CM | POA: Diagnosis not present

## 2023-06-25 DIAGNOSIS — K0381 Cracked tooth: Secondary | ICD-10-CM | POA: Diagnosis not present

## 2023-06-25 DIAGNOSIS — R339 Retention of urine, unspecified: Secondary | ICD-10-CM | POA: Diagnosis not present

## 2023-06-25 DIAGNOSIS — G9341 Metabolic encephalopathy: Secondary | ICD-10-CM | POA: Insufficient documentation

## 2023-06-25 DIAGNOSIS — F1123 Opioid dependence with withdrawal: Secondary | ICD-10-CM | POA: Diagnosis present

## 2023-06-25 DIAGNOSIS — G4710 Hypersomnia, unspecified: Secondary | ICD-10-CM | POA: Diagnosis present

## 2023-06-25 DIAGNOSIS — G928 Other toxic encephalopathy: Secondary | ICD-10-CM | POA: Diagnosis present

## 2023-06-25 HISTORY — DX: Other psychoactive substance abuse, uncomplicated: F19.10

## 2023-06-25 LAB — CBC WITH DIFFERENTIAL/PLATELET
Abs Immature Granulocytes: 0.03 10*3/uL (ref 0.00–0.07)
Basophils Absolute: 0 10*3/uL (ref 0.0–0.1)
Basophils Relative: 0 %
Eosinophils Absolute: 0.3 10*3/uL (ref 0.0–0.5)
Eosinophils Relative: 3 %
HCT: 40.7 % (ref 36.0–46.0)
Hemoglobin: 13.2 g/dL (ref 12.0–15.0)
Immature Granulocytes: 0 %
Lymphocytes Relative: 52 %
Lymphs Abs: 5.4 10*3/uL — ABNORMAL HIGH (ref 0.7–4.0)
MCH: 27.8 pg (ref 26.0–34.0)
MCHC: 32.4 g/dL (ref 30.0–36.0)
MCV: 85.7 fL (ref 80.0–100.0)
Monocytes Absolute: 0.5 10*3/uL (ref 0.1–1.0)
Monocytes Relative: 5 %
Neutro Abs: 4.3 10*3/uL (ref 1.7–7.7)
Neutrophils Relative %: 40 %
Platelets: 414 10*3/uL — ABNORMAL HIGH (ref 150–400)
RBC: 4.75 MIL/uL (ref 3.87–5.11)
RDW: 13.5 % (ref 11.5–15.5)
WBC: 10.6 10*3/uL — ABNORMAL HIGH (ref 4.0–10.5)
nRBC: 0 % (ref 0.0–0.2)

## 2023-06-25 LAB — COMPREHENSIVE METABOLIC PANEL
ALT: 11 U/L (ref 0–44)
AST: 13 U/L — ABNORMAL LOW (ref 15–41)
Albumin: 4 g/dL (ref 3.5–5.0)
Alkaline Phosphatase: 89 U/L (ref 38–126)
Anion gap: 10 (ref 5–15)
BUN: 7 mg/dL — ABNORMAL LOW (ref 8–23)
CO2: 22 mmol/L (ref 22–32)
Calcium: 8.5 mg/dL — ABNORMAL LOW (ref 8.9–10.3)
Chloride: 104 mmol/L (ref 98–111)
Creatinine, Ser: 0.76 mg/dL (ref 0.44–1.00)
GFR, Estimated: >60 mL/min (ref >60)
Glucose, Bld: 112 mg/dL — ABNORMAL HIGH (ref 70–99)
Potassium: 3.5 mmol/L (ref 3.5–5.1)
Sodium: 136 mmol/L (ref 135–145)
Total Bilirubin: 0.5 mg/dL (ref 0.3–1.2)
Total Protein: 7.5 g/dL (ref 6.5–8.1)

## 2023-06-25 LAB — ACETAMINOPHEN LEVEL: Acetaminophen (Tylenol), Serum: <10 ug/mL — ABNORMAL LOW (ref 10–30)

## 2023-06-25 LAB — ETHANOL: Alcohol, Ethyl (B): <10 mg/dL (ref <10)

## 2023-06-25 MED ORDER — ALBUTEROL SULFATE (2.5 MG/3ML) 0.083% IN NEBU
INHALATION_SOLUTION | RESPIRATORY_TRACT | Status: AC
  Administered 2023-06-25: 5 mg
  Filled 2023-06-25: qty 6

## 2023-06-25 MED ORDER — ALBUTEROL SULFATE HFA 108 (90 BASE) MCG/ACT IN AERS: 2.0000 | INHALATION_SPRAY | RESPIRATORY_TRACT | Status: DC | PRN

## 2023-06-25 MED ORDER — CHLORHEXIDINE GLUCONATE CLOTH 2 % EX PADS
6.0000 | MEDICATED_PAD | Freq: Every day | CUTANEOUS | Status: DC
  Administered 2023-06-26 – 2023-07-12 (×18): 6 via TOPICAL

## 2023-06-25 MED ORDER — SODIUM CHLORIDE 0.9 % IV SOLN
2.0000 g | INTRAVENOUS | Status: DC
  Administered 2023-06-26: 2 g via INTRAVENOUS
  Filled 2023-06-25: qty 20

## 2023-06-25 MED ORDER — IPRATROPIUM BROMIDE 0.02 % IN SOLN
RESPIRATORY_TRACT | Status: AC
  Administered 2023-06-25: 0.5 mg
  Filled 2023-06-25: qty 2.5

## 2023-06-25 MED ORDER — NALOXONE HCL 2 MG/2ML IJ SOSY
1.0000 mg | PREFILLED_SYRINGE | Freq: Once | INTRAMUSCULAR | Status: AC
  Administered 2023-06-25: 1 mg via INTRAVENOUS
  Filled 2023-06-25: qty 2

## 2023-06-25 MED ORDER — SODIUM CHLORIDE 0.9 % IV BOLUS
500.0000 mL | Freq: Once | INTRAVENOUS | Status: AC
  Administered 2023-06-25: 500 mL via INTRAVENOUS

## 2023-06-25 MED ORDER — SODIUM CHLORIDE 0.9 % IV SOLN
3.0000 g | Freq: Once | INTRAVENOUS | Status: AC
  Administered 2023-06-25: 3 g via INTRAVENOUS
  Filled 2023-06-25: qty 8

## 2023-06-25 NOTE — ED Notes (Signed)
ED Provider at bedside. 

## 2023-06-25 NOTE — ED Notes (Signed)
Patient transported to X-ray 

## 2023-06-25 NOTE — ED Provider Notes (Signed)
[place]MENT AT [facility] Provider Note   CSN: [id number] Arrival date & time: 06/25/23  2035     History {Add pertinent medical, surgical, social history, OB history to HPI:1} Chief Complaint  Patient presents with   Shortness of Breath    Casey [name] is a 62 y.o. female.  Patient was found by family unresponsive.  They called the paramedics who gave the patient some Narcan and she woke up.  Initially she refused transport.  According to the family she has been taking benzos and pain medicines.   Shortness of Breath      Home Medications Prior to Admission medications   Medication Sig Start Date End Date Taking? Authorizing Provider  citalopram (CELEXA) 20 MG tablet TAKE 1 TABLET BY MOUTH EVERY DAY 03/01/23   Sonny Masters, FNP  hydrOXYzine (ATARAX) 50 MG tablet TAKE 1 TABLET BY MOUTH THREE TIMES A DAY AS NEEDED 04/19/23   Rakes, Doralee Albino, FNP  Vitamin D, Ergocalciferol, (DRISDOL) 1.25 MG (50000 UNIT) CAPS capsule Take 1 capsule (50,000 Units total) by mouth every 7 (seven) days. 12/24/22   Sonny Masters, FNP      Allergies    Ampicillin    Review of Systems   Review of Systems  Respiratory:  Positive for shortness of breath.     Physical Exam Updated Vital Signs BP (!) 139/95   Pulse (!) 102   Temp 97.6 F (36.4 C) (Oral)   Resp (!) 38   Ht 5\' 5"  (1.651 m)   Wt 61 kg   LMP  (LMP Unknown)   SpO2 (!) 89%   BMI 22.38 kg/m  Physical Exam  ED Results / Procedures / Treatments   Labs (all labs ordered are listed, but only abnormal results are displayed) Labs Reviewed  CBC WITH DIFFERENTIAL/PLATELET - Abnormal; Notable for the following components:      Result Value   WBC 10.6 (*)    Platelets 414 (*)    Lymphs Abs 5.4 (*)    All other components within normal limits  COMPREHENSIVE METABOLIC PANEL - Abnormal; Notable for the following components:   Glucose, Bld 112 (*)    BUN 7 (*)    Calcium 8.5 (*)    AST 13 (*)     All other components within normal limits  ACETAMINOPHEN LEVEL - Abnormal; Notable for the following components:   Acetaminophen (Tylenol), Serum <10 (*)    All other components within normal limits  ETHANOL  RAPID URINE DRUG SCREEN, HOSP PERFORMED  URINALYSIS, ROUTINE W REFLEX MICROSCOPIC    EKG None  Radiology DG Chest 2 View  Result Date: 06/25/2023 CLINICAL DATA:  Dyspnea EXAM: CHEST - 2 VIEW COMPARISON:  09/04/2019 FINDINGS: Asymmetric bibasilar interstitial and airspace infiltrates are present, more focal within the right mid lung zone in keeping with changes of multifocal pneumonia and/or aspiration in the appropriate clinical setting. No pneumothorax or pleural effusion. Cardiac size within normal limits. Pulmonary vascularity is normal. No acute bone abnormality. Left total shoulder arthroplasty is partially visualized. IMPRESSION: 1. Asymmetric bibasilar interstitial and airspace infiltrates, more focal within the right mid lung zone in keeping with changes of multifocal pneumonia and/or aspiration in the appropriate clinical setting. Electronically Signed   By: Helyn Numbers M.D.   On: 06/25/2023 21:31    Procedures Procedures  {Document cardiac monitor, telemetry assessment procedure when appropriate:1}  Medications Ordered in ED Medications  albuterol (VENTOLIN HFA) 108 (90 Base) MCG/ACT inhaler 2  puff (has no administration in time range)  Ampicillin-Sulbactam (UNASYN) 3 g in sodium chloride 0.9 % 100 mL IVPB (3 g Intravenous New Bag/Given 06/25/23 2151)  naloxone [facility]) injection 1 mg (1 mg Intravenous Given 06/25/23 2056)  sodium chloride 0.9 % bolus 500 mL (500 mLs Intravenous New Bag/Given 06/25/23 2132)  ipratropium (ATROVENT) 0.02 % nebulizer solution (0.5 mg  Given 06/25/23 2119)  albuterol (PROVENTIL) (2.5 MG/3ML) 0.083% nebulizer solution (5 mg  Given 06/25/23 2119)    ED Course/ Medical Decision Making/ A&P   {  CRITICAL CARE Performed by: Bethann Berkshire Total  critical care time: 45 minutes Critical care time was exclusive of separately billable procedures and treating other patients. Critical care was necessary to treat or prevent imminent or life-threatening deterioration. Critical care was time spent personally by me on the following activities: development of treatment plan with patient and/or surrogate as well as nursing, discussions with consultants, evaluation of patient's response to treatment, examination of patient, obtaining history from patient or surrogate, ordering and performing treatments and interventions, ordering and review of laboratory studies, ordering and review of radiographic studies, pulse oximetry and re-evaluation of patient's condition.  Click here for ABCD2, HEART and other calculatorsREFRESH Note before signing :1}                              Medical Decision Making Amount and/or Complexity of Data Reviewed Labs: ordered. Radiology: ordered. ECG/medicine tests: ordered.  Risk Prescription drug management. Decision regarding hospitalization.   Patient with hypoxia and aspiration pneumonia and overdose of narcotics and benzos.  She will be admitted to medicine  {Document critical care time when appropriate:1} {Document review of labs and clinical decision tools ie heart score, Chads2Vasc2 etc:1}  {Document your independent review of radiology images, and any outside records:1} {Document your discussion with family members, caretakers, and with consultants:1} {Document social determinants of health affecting pt's care:1} {Document your decision making why or why not admission, treatments were needed:1} Final Clinical Impression(s) / ED Diagnoses Final diagnoses:  Hypoxia    Rx / DC Orders ED Discharge Orders     None

## 2023-06-25 NOTE — ED Triage Notes (Signed)
Pt brought in by son who stated that pt had been unresponsive at home, they called EMS and EMS came out and gave pt 2mg  IN Narcan and an Albuterol neb and wanted to transport pt to hospital but she refused and that he was then able to convince her to come to hospital .Son dropped pt off and then left without providing any more details. Pt states she "thinks she took 2 Xanax that weren't Xanax". Pt sleepy with garbled speech. Ablt to follow commands. Oriented x 3. Pt with non-productive cough and low O2 sats on room air of 86%. Pt tried on nasal cannula but eventually required Non-rebreather. Sat now 96%.

## 2023-06-26 ENCOUNTER — Encounter (HOSPITAL_COMMUNITY): Payer: Self-pay | Admitting: Family Medicine

## 2023-06-26 ENCOUNTER — Inpatient Hospital Stay (HOSPITAL_COMMUNITY): Payer: MEDICAID

## 2023-06-26 ENCOUNTER — Observation Stay (HOSPITAL_COMMUNITY): Payer: MEDICAID

## 2023-06-26 DIAGNOSIS — F411 Generalized anxiety disorder: Secondary | ICD-10-CM | POA: Diagnosis present

## 2023-06-26 DIAGNOSIS — E8770 Fluid overload, unspecified: Secondary | ICD-10-CM | POA: Diagnosis present

## 2023-06-26 DIAGNOSIS — J9601 Acute respiratory failure with hypoxia: Secondary | ICD-10-CM | POA: Insufficient documentation

## 2023-06-26 DIAGNOSIS — F17210 Nicotine dependence, cigarettes, uncomplicated: Secondary | ICD-10-CM | POA: Diagnosis present

## 2023-06-26 DIAGNOSIS — F1123 Opioid dependence with withdrawal: Secondary | ICD-10-CM | POA: Diagnosis not present

## 2023-06-26 DIAGNOSIS — A419 Sepsis, unspecified organism: Secondary | ICD-10-CM | POA: Diagnosis not present

## 2023-06-26 DIAGNOSIS — F1910 Other psychoactive substance abuse, uncomplicated: Secondary | ICD-10-CM | POA: Diagnosis not present

## 2023-06-26 DIAGNOSIS — J189 Pneumonia, unspecified organism: Secondary | ICD-10-CM

## 2023-06-26 DIAGNOSIS — E440 Moderate protein-calorie malnutrition: Secondary | ICD-10-CM | POA: Diagnosis not present

## 2023-06-26 DIAGNOSIS — J90 Pleural effusion, not elsewhere classified: Secondary | ICD-10-CM | POA: Diagnosis not present

## 2023-06-26 DIAGNOSIS — G4710 Hypersomnia, unspecified: Secondary | ICD-10-CM | POA: Diagnosis present

## 2023-06-26 DIAGNOSIS — R0902 Hypoxemia: Secondary | ICD-10-CM

## 2023-06-26 DIAGNOSIS — G9341 Metabolic encephalopathy: Secondary | ICD-10-CM | POA: Diagnosis not present

## 2023-06-26 DIAGNOSIS — R410 Disorientation, unspecified: Secondary | ICD-10-CM | POA: Diagnosis not present

## 2023-06-26 DIAGNOSIS — F339 Major depressive disorder, recurrent, unspecified: Secondary | ICD-10-CM | POA: Diagnosis not present

## 2023-06-26 DIAGNOSIS — G928 Other toxic encephalopathy: Secondary | ICD-10-CM | POA: Diagnosis not present

## 2023-06-26 DIAGNOSIS — D649 Anemia, unspecified: Secondary | ICD-10-CM | POA: Diagnosis not present

## 2023-06-26 DIAGNOSIS — J690 Pneumonitis due to inhalation of food and vomit: Secondary | ICD-10-CM

## 2023-06-26 DIAGNOSIS — Z96612 Presence of left artificial shoulder joint: Secondary | ICD-10-CM | POA: Diagnosis present

## 2023-06-26 DIAGNOSIS — R6520 Severe sepsis without septic shock: Secondary | ICD-10-CM | POA: Diagnosis present

## 2023-06-26 DIAGNOSIS — R54 Age-related physical debility: Secondary | ICD-10-CM | POA: Diagnosis not present

## 2023-06-26 DIAGNOSIS — J441 Chronic obstructive pulmonary disease with (acute) exacerbation: Secondary | ICD-10-CM | POA: Diagnosis not present

## 2023-06-26 DIAGNOSIS — W06XXXA Fall from bed, initial encounter: Secondary | ICD-10-CM | POA: Diagnosis not present

## 2023-06-26 DIAGNOSIS — E876 Hypokalemia: Secondary | ICD-10-CM | POA: Diagnosis present

## 2023-06-26 DIAGNOSIS — R1312 Dysphagia, oropharyngeal phase: Secondary | ICD-10-CM | POA: Diagnosis present

## 2023-06-26 DIAGNOSIS — F5101 Primary insomnia: Secondary | ICD-10-CM | POA: Diagnosis not present

## 2023-06-26 DIAGNOSIS — R1111 Vomiting without nausea: Secondary | ICD-10-CM | POA: Diagnosis not present

## 2023-06-26 DIAGNOSIS — F19129 Other psychoactive substance abuse with intoxication, unspecified: Secondary | ICD-10-CM | POA: Diagnosis present

## 2023-06-26 DIAGNOSIS — F410 Panic disorder [episodic paroxysmal anxiety] without agoraphobia: Secondary | ICD-10-CM | POA: Diagnosis present

## 2023-06-26 DIAGNOSIS — J440 Chronic obstructive pulmonary disease with acute lower respiratory infection: Secondary | ICD-10-CM | POA: Diagnosis not present

## 2023-06-26 DIAGNOSIS — M549 Dorsalgia, unspecified: Secondary | ICD-10-CM | POA: Diagnosis present

## 2023-06-26 DIAGNOSIS — F1410 Cocaine abuse, uncomplicated: Secondary | ICD-10-CM | POA: Diagnosis present

## 2023-06-26 LAB — COMPREHENSIVE METABOLIC PANEL
ALT: 17 U/L (ref 0–44)
AST: 27 U/L (ref 15–41)
Albumin: 3.1 g/dL — ABNORMAL LOW (ref 3.5–5.0)
Alkaline Phosphatase: 80 U/L (ref 38–126)
Anion gap: 9 (ref 5–15)
BUN: 8 mg/dL (ref 8–23)
CO2: 21 mmol/L — ABNORMAL LOW (ref 22–32)
Calcium: 7.8 mg/dL — ABNORMAL LOW (ref 8.9–10.3)
Chloride: 105 mmol/L (ref 98–111)
Creatinine, Ser: 0.69 mg/dL (ref 0.44–1.00)
GFR, Estimated: >60 mL/min (ref >60)
Glucose, Bld: 153 mg/dL — ABNORMAL HIGH (ref 70–99)
Potassium: 3.9 mmol/L (ref 3.5–5.1)
Sodium: 135 mmol/L (ref 135–145)
Total Bilirubin: 0.6 mg/dL (ref 0.3–1.2)
Total Protein: 6.2 g/dL — ABNORMAL LOW (ref 6.5–8.1)

## 2023-06-26 LAB — BLOOD GAS, ARTERIAL
Acid-base deficit: 0.7 mmol/L (ref 0.0–2.0)
Bicarbonate: 23.4 mmol/L (ref 20.0–28.0)
Drawn by: 22179
O2 Saturation: 96 %
Patient temperature: 36.7
pCO2 arterial: 35 mmHg (ref 32–48)
pH, Arterial: 7.42 (ref 7.35–7.45)
pO2, Arterial: 65 mmHg — ABNORMAL LOW (ref 83–108)

## 2023-06-26 LAB — CBC WITH DIFFERENTIAL/PLATELET
Abs Immature Granulocytes: 0.18 10*3/uL — ABNORMAL HIGH (ref 0.00–0.07)
Basophils Absolute: 0 10*3/uL (ref 0.0–0.1)
Basophils Relative: 0 %
Eosinophils Absolute: 0 10*3/uL (ref 0.0–0.5)
Eosinophils Relative: 0 %
HCT: 38.6 % (ref 36.0–46.0)
Hemoglobin: 12.6 g/dL (ref 12.0–15.0)
Immature Granulocytes: 1 %
Lymphocytes Relative: 5 %
Lymphs Abs: 1 10*3/uL (ref 0.7–4.0)
MCH: 27.8 pg (ref 26.0–34.0)
MCHC: 32.6 g/dL (ref 30.0–36.0)
MCV: 85.2 fL (ref 80.0–100.0)
Monocytes Absolute: 1.3 10*3/uL — ABNORMAL HIGH (ref 0.1–1.0)
Monocytes Relative: 6 %
Neutro Abs: 20.2 10*3/uL — ABNORMAL HIGH (ref 1.7–7.7)
Neutrophils Relative %: 88 %
Platelets: 390 10*3/uL (ref 150–400)
RBC: 4.53 MIL/uL (ref 3.87–5.11)
RDW: 13.7 % (ref 11.5–15.5)
WBC: 22.8 10*3/uL — ABNORMAL HIGH (ref 4.0–10.5)
nRBC: 0 % (ref 0.0–0.2)

## 2023-06-26 LAB — MRSA NEXT GEN BY PCR, NASAL: MRSA by PCR Next Gen: DETECTED — AB

## 2023-06-26 LAB — URINALYSIS, ROUTINE W REFLEX MICROSCOPIC
Bacteria, UA: NONE SEEN
Bilirubin Urine: NEGATIVE
Glucose, UA: NEGATIVE mg/dL
Ketones, ur: NEGATIVE mg/dL
Leukocytes,Ua: NEGATIVE
Nitrite: NEGATIVE
Protein, ur: NEGATIVE mg/dL
Specific Gravity, Urine: 1.014 (ref 1.005–1.030)
pH: 5 (ref 5.0–8.0)

## 2023-06-26 LAB — RAPID URINE DRUG SCREEN, HOSP PERFORMED
Amphetamines: NOT DETECTED
Barbiturates: NOT DETECTED
Benzodiazepines: POSITIVE — AB
Cocaine: POSITIVE — AB
Opiates: NOT DETECTED
Tetrahydrocannabinol: POSITIVE — AB

## 2023-06-26 LAB — STREP PNEUMONIAE URINARY ANTIGEN: Strep Pneumo Urinary Antigen: NEGATIVE

## 2023-06-26 LAB — GLUCOSE, CAPILLARY
Glucose-Capillary: 129 mg/dL — ABNORMAL HIGH (ref 70–99)
Glucose-Capillary: 126 mg/dL — ABNORMAL HIGH (ref 70–99)

## 2023-06-26 LAB — HIV ANTIBODY (ROUTINE TESTING W REFLEX): HIV Screen 4th Generation wRfx: NONREACTIVE

## 2023-06-26 LAB — MAGNESIUM: Magnesium: 1.7 mg/dL (ref 1.7–2.4)

## 2023-06-26 LAB — TROPONIN I (HIGH SENSITIVITY): Troponin I (High Sensitivity): 3 ng/L (ref <18)

## 2023-06-26 MED ORDER — DEXMEDETOMIDINE HCL IN NACL 400 MCG/100ML IV SOLN
INTRAVENOUS | Status: AC
  Administered 2023-06-26: 0.4 ug/kg/h via INTRAVENOUS
  Filled 2023-06-26: qty 100

## 2023-06-26 MED ORDER — HEPARIN SODIUM (PORCINE) 5000 UNIT/ML IJ SOLN
5000.0000 [IU] | Freq: Three times a day (TID) | INTRAMUSCULAR | Status: DC
  Administered 2023-06-26 (×2): 5000 [IU] via SUBCUTANEOUS
  Filled 2023-06-26 (×2): qty 1

## 2023-06-26 MED ORDER — CITALOPRAM HYDROBROMIDE 20 MG PO TABS
20.0000 mg | ORAL_TABLET | Freq: Every day | ORAL | Status: DC
  Administered 2023-06-26 – 2023-06-28 (×2): 20 mg via ORAL
  Filled 2023-06-26 (×4): qty 1

## 2023-06-26 MED ORDER — MENTHOL 3 MG MT LOZG
1.0000 | LOZENGE | OROMUCOSAL | Status: DC | PRN
  Administered 2023-06-26: 3 mg via ORAL
  Filled 2023-06-26: qty 9

## 2023-06-26 MED ORDER — HYDROXYZINE HCL 25 MG PO TABS
50.0000 mg | ORAL_TABLET | Freq: Three times a day (TID) | ORAL | Status: DC | PRN
  Administered 2023-06-26 – 2023-06-28 (×2): 50 mg via ORAL
  Filled 2023-06-26 (×2): qty 2

## 2023-06-26 MED ORDER — REVEFENACIN 175 MCG/3ML IN SOLN
175.0000 ug | Freq: Every day | RESPIRATORY_TRACT | Status: DC
  Administered 2023-06-27 – 2023-07-12 (×16): 175 ug via RESPIRATORY_TRACT
  Filled 2023-06-26 (×16): qty 3

## 2023-06-26 MED ORDER — IBUPROFEN 400 MG PO TABS
400.0000 mg | ORAL_TABLET | Freq: Four times a day (QID) | ORAL | Status: DC | PRN
  Administered 2023-06-26: 400 mg via ORAL
  Filled 2023-06-26: qty 1

## 2023-06-26 MED ORDER — METHYLPREDNISOLONE SODIUM SUCC 40 MG IJ SOLR
40.0000 mg | Freq: Two times a day (BID) | INTRAMUSCULAR | Status: DC
  Administered 2023-06-27 (×2): 40 mg via INTRAVENOUS
  Filled 2023-06-26 (×2): qty 1

## 2023-06-26 MED ORDER — ALBUTEROL SULFATE (2.5 MG/3ML) 0.083% IN NEBU
2.5000 mg | INHALATION_SOLUTION | RESPIRATORY_TRACT | Status: DC | PRN
  Administered 2023-06-26 – 2023-06-27 (×2): 2.5 mg via RESPIRATORY_TRACT
  Filled 2023-06-26 (×3): qty 3

## 2023-06-26 MED ORDER — VANCOMYCIN HCL 1500 MG/300ML IV SOLN
1500.0000 mg | INTRAVENOUS | Status: DC
  Administered 2023-06-27 – 2023-06-29 (×3): 1500 mg via INTRAVENOUS
  Filled 2023-06-26 (×4): qty 300

## 2023-06-26 MED ORDER — ONDANSETRON HCL 4 MG PO TABS: 4.0000 mg | ORAL_TABLET | Freq: Four times a day (QID) | ORAL | Status: DC | PRN

## 2023-06-26 MED ORDER — LACTATED RINGERS IV SOLN: INTRAVENOUS | Status: AC

## 2023-06-26 MED ORDER — DEXMEDETOMIDINE HCL IN NACL 400 MCG/100ML IV SOLN
0.0000 ug/kg/h | INTRAVENOUS | Status: DC
  Administered 2023-06-27: 0.7 ug/kg/h via INTRAVENOUS
  Administered 2023-06-27: 0.5 ug/kg/h via INTRAVENOUS
  Administered 2023-06-28: 1 ug/kg/h via INTRAVENOUS
  Administered 2023-06-28: 1.2 ug/kg/h via INTRAVENOUS
  Administered 2023-06-28: 0.6 ug/kg/h via INTRAVENOUS
  Administered 2023-06-28: 0.8 ug/kg/h via INTRAVENOUS
  Administered 2023-06-28: 0.2 ug/kg/h via INTRAVENOUS
  Administered 2023-06-29 – 2023-06-30 (×4): 1.2 ug/kg/h via INTRAVENOUS
  Filled 2023-06-26 (×11): qty 100

## 2023-06-26 MED ORDER — POLYETHYLENE GLYCOL 3350 17 G PO PACK: 17.0000 g | PACK | Freq: Every day | ORAL | Status: DC | PRN

## 2023-06-26 MED ORDER — ACETAMINOPHEN 10 MG/ML IV SOLN
1000.0000 mg | Freq: Once | INTRAVENOUS | Status: AC
  Administered 2023-06-26: 1000 mg via INTRAVENOUS
  Filled 2023-06-26: qty 100

## 2023-06-26 MED ORDER — VANCOMYCIN HCL 1500 MG/300ML IV SOLN
1500.0000 mg | Freq: Once | INTRAVENOUS | Status: AC
  Administered 2023-06-26: 1500 mg via INTRAVENOUS
  Filled 2023-06-26: qty 300

## 2023-06-26 MED ORDER — ACETAMINOPHEN 650 MG RE SUPP: 650.0000 mg | Freq: Four times a day (QID) | RECTAL | Status: DC | PRN

## 2023-06-26 MED ORDER — DOCUSATE SODIUM 100 MG PO CAPS: 100.0000 mg | ORAL_CAPSULE | Freq: Two times a day (BID) | ORAL | Status: DC | PRN

## 2023-06-26 MED ORDER — KETOROLAC TROMETHAMINE 30 MG/ML IJ SOLN
30.0000 mg | Freq: Three times a day (TID) | INTRAMUSCULAR | Status: AC | PRN
  Administered 2023-06-26 – 2023-06-27 (×2): 30 mg via INTRAVENOUS
  Filled 2023-06-26 (×3): qty 1

## 2023-06-26 MED ORDER — ORAL CARE MOUTH RINSE: 15.0000 mL | OROMUCOSAL | Status: DC | PRN

## 2023-06-26 MED ORDER — LORAZEPAM 2 MG/ML IJ SOLN
1.0000 mg | Freq: Four times a day (QID) | INTRAMUSCULAR | Status: DC | PRN
  Administered 2023-06-26 – 2023-06-29 (×7): 1 mg via INTRAVENOUS
  Filled 2023-06-26 (×7): qty 1

## 2023-06-26 MED ORDER — HEPARIN SODIUM (PORCINE) 5000 UNIT/ML IJ SOLN
5000.0000 [IU] | Freq: Three times a day (TID) | INTRAMUSCULAR | Status: DC
  Administered 2023-06-26 – 2023-07-12 (×47): 5000 [IU] via SUBCUTANEOUS
  Filled 2023-06-26 (×47): qty 1

## 2023-06-26 MED ORDER — METHOCARBAMOL 1000 MG/10ML IJ SOLN: 500.0000 mg | Freq: Three times a day (TID) | INTRAVENOUS | Status: DC | PRN

## 2023-06-26 MED ORDER — GUAIFENESIN ER 600 MG PO TB12
600.0000 mg | ORAL_TABLET | Freq: Two times a day (BID) | ORAL | Status: DC
  Administered 2023-06-26: 600 mg via ORAL
  Filled 2023-06-26: qty 1

## 2023-06-26 MED ORDER — PANTOPRAZOLE SODIUM 40 MG IV SOLR
40.0000 mg | INTRAVENOUS | Status: DC
  Administered 2023-06-26 – 2023-07-03 (×8): 40 mg via INTRAVENOUS
  Filled 2023-06-26 (×8): qty 10

## 2023-06-26 MED ORDER — BUDESONIDE 0.25 MG/2ML IN SUSP
0.2500 mg | Freq: Two times a day (BID) | RESPIRATORY_TRACT | Status: DC
  Administered 2023-06-26 – 2023-07-12 (×31): 0.25 mg via RESPIRATORY_TRACT
  Filled 2023-06-26 (×32): qty 2

## 2023-06-26 MED ORDER — PHENOL 1.4 % MT LIQD: 1.0000 | OROMUCOSAL | Status: DC | PRN

## 2023-06-26 MED ORDER — PANTOPRAZOLE SODIUM 40 MG PO TBEC: 40.0000 mg | DELAYED_RELEASE_TABLET | Freq: Every day | ORAL | Status: DC

## 2023-06-26 MED ORDER — ALBUTEROL SULFATE (2.5 MG/3ML) 0.083% IN NEBU: 2.5000 mg | INHALATION_SOLUTION | RESPIRATORY_TRACT | Status: DC | PRN

## 2023-06-26 MED ORDER — ARFORMOTEROL TARTRATE 15 MCG/2ML IN NEBU
15.0000 ug | INHALATION_SOLUTION | Freq: Two times a day (BID) | RESPIRATORY_TRACT | Status: DC
  Administered 2023-06-26 – 2023-07-12 (×32): 15 ug via RESPIRATORY_TRACT
  Filled 2023-06-26 (×32): qty 2

## 2023-06-26 MED ORDER — ONDANSETRON HCL 4 MG/2ML IJ SOLN: 4.0000 mg | Freq: Four times a day (QID) | INTRAMUSCULAR | Status: DC | PRN

## 2023-06-26 MED ORDER — METHYLPREDNISOLONE SODIUM SUCC 125 MG IJ SOLR
125.0000 mg | Freq: Once | INTRAMUSCULAR | Status: AC
  Administered 2023-06-26: 125 mg via INTRAVENOUS
  Filled 2023-06-26: qty 2

## 2023-06-26 MED ORDER — SODIUM CHLORIDE 0.9 % IV SOLN
2.0000 g | Freq: Three times a day (TID) | INTRAVENOUS | Status: DC
  Administered 2023-06-26 – 2023-06-29 (×8): 2 g via INTRAVENOUS
  Filled 2023-06-26 (×8): qty 12.5

## 2023-06-26 MED ORDER — KETOROLAC TROMETHAMINE 15 MG/ML IJ SOLN
15.0000 mg | Freq: Once | INTRAMUSCULAR | Status: AC
  Administered 2023-06-26: 15 mg via INTRAVENOUS
  Filled 2023-06-26: qty 1

## 2023-06-26 MED ORDER — IPRATROPIUM-ALBUTEROL 0.5-2.5 (3) MG/3ML IN SOLN
3.0000 mL | Freq: Four times a day (QID) | RESPIRATORY_TRACT | Status: DC | PRN
  Administered 2023-06-28 – 2023-07-01 (×3): 3 mL via RESPIRATORY_TRACT
  Filled 2023-06-26 (×3): qty 3

## 2023-06-26 MED ORDER — MUPIROCIN 2 % EX OINT
TOPICAL_OINTMENT | Freq: Two times a day (BID) | CUTANEOUS | Status: DC
  Administered 2023-06-26 – 2023-07-06 (×6): 1 via NASAL
  Filled 2023-06-26 (×4): qty 22

## 2023-06-26 MED ORDER — IPRATROPIUM-ALBUTEROL 0.5-2.5 (3) MG/3ML IN SOLN
3.0000 mL | Freq: Four times a day (QID) | RESPIRATORY_TRACT | Status: DC
  Administered 2023-06-26 (×4): 3 mL via RESPIRATORY_TRACT
  Filled 2023-06-26 (×4): qty 3

## 2023-06-26 MED ORDER — ACETAMINOPHEN 325 MG PO TABS
650.0000 mg | ORAL_TABLET | Freq: Four times a day (QID) | ORAL | Status: DC | PRN
  Administered 2023-06-26: 650 mg via ORAL
  Filled 2023-06-26: qty 2

## 2023-06-26 NOTE — Progress Notes (Signed)
Changed nursing care due to family request and conflict with primary nurse.

## 2023-06-26 NOTE — Progress Notes (Signed)
Pt taken off BIPAP and placed on HHFNC 25L @ 90%. Pt tol well and is much less anxious without the BIPAP mask on. Will cont to monitor.  Nelda Marseille

## 2023-06-26 NOTE — Progress Notes (Signed)
   06/26/23 1339  TOC Brief Assessment  Insurance and Status Reviewed  Patient has primary care physician Yes  Home environment has been reviewed From Home  Prior level of function: Independent  Prior/Current Home Services No current home services  Social Determinants of Health Reivew SDOH reviewed no interventions necessary  Readmission risk has been reviewed Yes (No score)  Transition of care needs no transition of care needs at this time   Pt has a hx of poy substance use.  SA education and treatment information added to the AVS.  TOC will follow should there be additional needs.

## 2023-06-26 NOTE — Assessment & Plan Note (Signed)
-  Unresponsive episode followed by hypoxia and rhonchi -Right side multi-focal pneumonia -Unasyn started in ED, but patient does have ampicillin allergy -Continue Rocephin -Culture sputum -Continue albuterol as needed -RT eval and treat

## 2023-06-26 NOTE — Progress Notes (Signed)
   06/26/23 2342  Oxygen Therapy/Pulse Ox  O2 Device HFNC  O2 Therapy Oxygen humidified  O2 Flow Rate (L/min) 50 L/min  FiO2 (%) 95 %  SpO2 98 %   Increased flow rate to 50 and FIO2 to 95% due to patient having increased WOB and desat. She was also extremely anxious around the time her son and his fiance were here. She was given ativan and now she is sleeping and WOB is better. Will cont to monitor.  Nelda Marseille

## 2023-06-26 NOTE — Discharge Summary (Signed)
Physician Discharge Summary   Patient: Casey [name] MRN: [id number]  DOB: [birth year]   Admit:     Date of Admission: 06/25/2023 Admitted from: home   Discharge: Date of discharge: 06/26/23 Disposition:  [facility] Condition at discharge: stable  CODE STATUS: FULL CODE     Discharge Physician: Sunnie Nielsen, DO [facility]     PCP: Patient, No Pcp Per  Recommendations for Outpatient Follow-up:  Pending clinical course        Discharge Diagnoses: Principal Problem:   Aspiration pneumonia (HCC) Active Problems:   Depression, recurrent (HCC)   GAD (generalized anxiety disorder)   Acute respiratory failure with hypoxia (HCC)   Polysubstance abuse (HCC)   Acute metabolic encephalopathy       Hospital Course: Casey [name] is a 62 y.o. female with medical history significant of anxiety, chronic pain, depression, suicide attempt, presents the ED with a chief complaint of altered mental status. Per H&P noted CC was "shortness of breath, and she is requiring oxygen, but the ED reported that she was brought in for unresponsiveness. When asked the patient herself she says she does not know why she was brought in and she cannot tell me what she remembers prior to being brought into the hospital. Daughter had reported to the ED provider that patient was taking Xanax and opiates that were not prescribed."  08/16: in evening, to ED, UDS(+) cocaine, THC, benzo. CXR concern for multifocal PNA. Given narcan which improved alertness and then caused agitation.  08/17: early AM admitted to hospitalist service for aspiration pneumonia Requiring HFNC. Alert this morning. WOB increasing throughout afternoon and CXR appears worse. Reached out to PCCM, trial BiPap here appears improved / less WOB, ABG hypoxic. D/t high risk for intubation, per Dr. Delton Coombes, recs for transport to [facility] while stable.  Consultants:  none  Procedures: none      ASSESSMENT & PLAN:    Acute metabolic/toxic encephalopathy d/t PNA/Hypoxis as well as polysubstance  Treat underlying causes as below   Aspiration pneumonia (HCC) Right side multi-focal pneumonia Unasyn started in ED, but patient does have ampicillin allergy Continue Rocephin Culture sputum Continue albuterol as needed Supplemental O2 Bronchodilators prn Worsening this afternoon in terms of WOB, SpO2 93% 7L HFNC repeat CXR --> appears worsening infiltrate ABG pending --> hypoxic BiPap --> normalized reps rate, pt appears more comfortable   Chest pain Tachycardia Suspect costochondritis complicated by GERD Likely component of o9piate withdrawal  High risk for cardiac however  Reproducible on palpation  Troponin pending  CXR EKG prn   Polysubstance abuse (HCC) Likely opiate withdrawal  Positive for benzos, cocaine, and THC Question false negative opiate screen, certain fentanyl needs special UDS Reportedly taking xanax and opiates unprescribed PDMP no Rx on file Son states she's taking "who knows what" street drugs  TOC consult avoid controlled substances when possible  Headache, mild Tylenol, Ibuprofen   Sore throat Cepacol and chloraseptic prn   Acute hypoxic respiratory failure d/t PNA/encephalopathy as above  Supplemental O2 as needed  GAD (generalized anxiety disorder) Depression, recurrent (HCC) Continue celexa Monitor for withdrawal from benzos - although not prescribed, they are in her urine. The chronicity of her use is unknown.    DVT prophylaxis: heparin  Pertinent IV fluids/nutrition: LR 1L today 100 ml/h x10h. Heart healthy diet  Central lines / invasive devices: none  Code Status: FULL CODE ACP documentation reviewed: nont on file   Current Admission Status: obs, will need inpatient  TOC needs / Dispo plan: pend clinical improvement but expect home Barriers to discharge / significant pending items: wean off O2, BP improved             Discharge  Instructions  Allergies as of 06/26/2023       Reactions   Ampicillin Other (See Comments)   Reaction is unknown      Current Facility-Administered Medications:    acetaminophen (TYLENOL) tablet 650 mg, 650 mg, Oral, Q6H PRN, 650 mg at 06/26/23 0406 **OR** acetaminophen (TYLENOL) suppository 650 mg, 650 mg, Rectal, Q6H PRN, Zierle-Ghosh, Asia B, DO   albuterol (PROVENTIL) (2.5 MG/3ML) 0.083% nebulizer solution 2.5 mg, 2.5 mg, Nebulization, Q2H PRN, Zierle-Ghosh, Asia B, DO, 2.5 mg at 06/26/23 0038   cefTRIAXone (ROCEPHIN) 2 g in sodium chloride 0.9 % 100 mL IVPB, 2 g, Intravenous, Q24H, Zierle-Ghosh, Asia B, DO, Last Rate: 200 mL/hr at 06/26/23 0131, 2 g at 06/26/23 0131   Chlorhexidine Gluconate Cloth 2 % PADS 6 each, 6 each, Topical, Daily, Zierle-Ghosh, Asia B, DO, 6 each at 06/26/23 1045   citalopram (CELEXA) tablet 20 mg, 20 mg, Oral, Daily, Zierle-Ghosh, Asia B, DO, 20 mg at 06/26/23 1058   guaiFENesin (MUCINEX) 12 hr tablet 600 mg, 600 mg, Oral, BID, Zierle-Ghosh, Asia B, DO, 600 mg at 06/26/23 1058   heparin injection 5,000 Units, 5,000 Units, Subcutaneous, Q8H, Zierle-Ghosh, Asia B, DO, 5,000 Units at 06/26/23 1421   hydrOXYzine (ATARAX) tablet 50 mg, 50 mg, Oral, TID PRN, Zierle-Ghosh, Asia B, DO, 50 mg at 06/26/23 1102   ipratropium-albuterol (DUONEB) 0.5-2.5 (3) MG/3ML nebulizer solution 3 mL, 3 mL, Nebulization, QID, Zierle-Ghosh, Asia B, DO, 3 mL at 06/26/23 1545   ketorolac (TORADOL) 30 MG/ML injection 30 mg, 30 mg, Intravenous, Q8H PRN, Sunnie Nielsen, DO, 30 mg at 06/26/23 1649   lactated ringers infusion, , Intravenous, Continuous, Sunnie Nielsen, DO, Last Rate: 100 mL/hr at 06/26/23 1109, New Bag at 06/26/23 1109   menthol-cetylpyridinium (CEPACOL) lozenge 3 mg, 1 lozenge, Oral, PRN, Sunnie Nielsen, DO, 3 mg at 06/26/23 1103   methocarbamol (ROBAXIN) 500 mg in dextrose 5 % 50 mL IVPB, 500 mg, Intravenous, Q8H PRN, Zierle-Ghosh, Asia B, DO   mupirocin ointment  (BACTROBAN) 2 %, , Nasal, BID, Sunnie Nielsen, DO, Given at 06/26/23 1420   ondansetron (ZOFRAN) tablet 4 mg, 4 mg, Oral, Q6H PRN **OR** ondansetron (ZOFRAN) injection 4 mg, 4 mg, Intravenous, Q6H PRN, Zierle-Ghosh, Asia B, DO   Oral care mouth rinse, 15 mL, Mouth Rinse, PRN, Sunnie Nielsen, DO   phenol (CHLORASEPTIC) mouth spray 1 spray, 1 spray, Mouth/Throat, PRN, Sunnie Nielsen, DO          Allergies  Allergen Reactions   Ampicillin Other (See Comments)    Reaction is unknown     Subjective: pt reports SOB, chest pain. She is requesting pain medications.    Discharge Exam: BP 132/73   Pulse (!) 116   Temp 98.2 F (36.8 C) (Oral)   Resp (!) 22   Ht 5\' 5"  (1.651 m)   Wt 67.9 kg   LMP  (LMP Unknown)   SpO2 93%   BMI 24.91 kg/m  General: Pt is alert, awake, not in acute distress Cardiovascular: tachycardic, regular rhythm, S1S2, no rubs, no gallops Respiratory: diffuse coarse breath sounds worse on L, more on R compared to this morning  Abdominal: Soft, NT, ND, bowel sounds WNL Extremities: no edema, no cyanosis     The results of significant diagnostics from this  hospitalization (including imaging, microbiology, ancillary and laboratory) are listed below for reference.     Microbiology: Recent Results (from the past 240 hour(s))  MRSA Next Gen by PCR, Nasal     Status: Abnormal   Collection Time: 06/26/23 12:25 AM   Specimen: Nasal Mucosa; Nasal Swab  Result Value Ref Range Status   MRSA by PCR Next Gen DETECTED (A) NOT DETECTED Final    Comment: RESULT CALLED TO, READ BACK BY AND VERIFIED WITH: JEFF S. @ 1136 ON [id number] BY HENDERSON L (NOTE) The GeneXpert MRSA Assay (FDA approved for NASAL specimens only), is one component of a comprehensive MRSA colonization surveillance program. It is not intended to diagnose MRSA infection nor to guide or monitor treatment for MRSA infections. Test performance is not FDA approved in patients less than 14  years old. Performed at [facility], [address] [place], [place] [address]      Labs: BNP (last 3 results) No results for input(s): "BNP" in the last 8760 hours. Basic Metabolic Panel: Recent Labs  Lab 06/25/23 2035 06/26/23 0342  NA 136 135  K 3.5 3.9  CL 104 105  CO2 22 21*  GLUCOSE 112* 153*  BUN 7* 8  CREATININE 0.76 0.69  CALCIUM 8.5* 7.8*  MG  --  1.7   Liver Function Tests: Recent Labs  Lab 06/25/23 2035 06/26/23 0342  AST 13* 27  ALT 11 17  ALKPHOS 89 80  BILITOT 0.5 0.6  PROT 7.5 6.2*  ALBUMIN 4.0 3.1*   No results for input(s): "LIPASE", "AMYLASE" in the last 168 hours. No results for input(s): "AMMONIA" in the last 168 hours. CBC: Recent Labs  Lab 06/25/23 2035 06/26/23 0342  WBC 10.6* 22.8*  NEUTROABS 4.3 20.2*  HGB 13.2 12.6  HCT 40.7 38.6  MCV 85.7 85.2  PLT 414* 390   Cardiac Enzymes: No results for input(s): "CKTOTAL", "CKMB", "CKMBINDEX", "TROPONINI" in the last 168 hours. BNP: Invalid input(s): "POCBNP" CBG: No results for input(s): "GLUCAP" in the last 168 hours. D-Dimer No results for input(s): "DDIMER" in the last 72 hours. Hgb A1c No results for input(s): "HGBA1C" in the last 72 hours. Lipid Profile No results for input(s): "CHOL", "HDL", "LDLCALC", "TRIG", "CHOLHDL", "LDLDIRECT" in the last 72 hours. Thyroid function studies No results for input(s): "TSH", "T4TOTAL", "T3FREE", "THYROIDAB" in the last 72 hours.  Invalid input(s): "FREET3" Anemia work up No results for input(s): "VITAMINB12", "FOLATE", "FERRITIN", "TIBC", "IRON", "RETICCTPCT" in the last 72 hours. Urinalysis    Component Value Date/Time   COLORURINE YELLOW 06/26/2023 0134   APPEARANCEUR CLEAR 06/26/2023 0134   LABSPEC 1.014 06/26/2023 0134   PHURINE 5.0 06/26/2023 0134   GLUCOSEU NEGATIVE 06/26/2023 0134   HGBUR SMALL (A) 06/26/2023 0134   BILIRUBINUR NEGATIVE 06/26/2023 0134   KETONESUR NEGATIVE 06/26/2023 0134   PROTEINUR NEGATIVE 06/26/2023  0134   UROBILINOGEN 0.2 05/02/2009 1015   NITRITE NEGATIVE 06/26/2023 0134   LEUKOCYTESUR NEGATIVE 06/26/2023 0134   Sepsis Labs Recent Labs  Lab 06/25/23 2035 06/26/23 0342  WBC 10.6* 22.8*   Microbiology Recent Results (from the past 240 hour(s))  MRSA Next Gen by PCR, Nasal     Status: Abnormal   Collection Time: 06/26/23 12:25 AM   Specimen: Nasal Mucosa; Nasal Swab  Result Value Ref Range Status   MRSA by PCR Next Gen DETECTED (A) NOT DETECTED Final    Comment: RESULT CALLED TO, READ BACK BY AND VERIFIED WITH: JEFF S. @ 1136 ON [id number] BY HENDERSON L (  NOTE) The GeneXpert MRSA Assay (FDA approved for NASAL specimens only), is one component of a comprehensive MRSA colonization surveillance program. It is not intended to diagnose MRSA infection nor to guide or monitor treatment for MRSA infections. Test performance is not FDA approved in patients less than 77 years old. Performed at [facility], [address] [place], [place] [address]    Imaging DG CHEST PORT 1 VIEW  Result Date: 06/26/2023 CLINICAL DATA:  Tachypnea EXAM: PORTABLE CHEST 1 VIEW COMPARISON:  06/25/2023 FINDINGS: Multifocal pulmonary infiltrates have rapidly progressed in the interval, asymmetrically more severe throughout the right lung, in keeping with progressive multifocal pneumonic infiltrate. No pneumothorax or pleural effusion. Cardiac size within normal limits. No acute bone abnormality. IMPRESSION: 1. Rapid interval progression of multifocal pneumonic infiltrate. Electronically Signed   By: Helyn Numbers M.D.   On: 06/26/2023 01:11   DG Chest 2 View  Result Date: 06/25/2023 CLINICAL DATA:  Dyspnea EXAM: CHEST - 2 VIEW COMPARISON:  09/04/2019 FINDINGS: Asymmetric bibasilar interstitial and airspace infiltrates are present, more focal within the right mid lung zone in keeping with changes of multifocal pneumonia and/or aspiration in the appropriate clinical setting. No pneumothorax or pleural effusion.  Cardiac size within normal limits. Pulmonary vascularity is normal. No acute bone abnormality. Left total shoulder arthroplasty is partially visualized. IMPRESSION: 1. Asymmetric bibasilar interstitial and airspace infiltrates, more focal within the right mid lung zone in keeping with changes of multifocal pneumonia and/or aspiration in the appropriate clinical setting. Electronically Signed   By: Helyn Numbers M.D.   On: 06/25/2023 21:31      Time coordinating discharge: over 30 minutes  SIGNED:  Sunnie Nielsen DO [facility]

## 2023-06-26 NOTE — Hospital Course (Addendum)
Casey [name] is a 62 y.o. female with medical history significant of anxiety, chronic pain, depression, suicide attempt, presents the ED with a chief complaint of altered mental status. Per H&P noted CC was "shortness of breath, and she is requiring oxygen, but the ED reported that she was brought in for unresponsiveness. When asked the patient herself she says she does not know why she was brought in and she cannot tell me what she remembers prior to being brought into the hospital. Daughter had reported to the ED provider that patient was taking Xanax and opiates that were not prescribed."  08/16: in evening, to ED, UDS(+) cocaine, THC, benzo. CXR concern for multifocal PNA. Given narcan which improved alertness and then caused agitation.  08/17: early AM admitted to hospitalist service for aspiration pneumonia Requiring HFNC. Alert this morning. WOB increasing throughout afternoon and CXR appears worse. Reached out to PCCM, trial BiPap here appears improved / less WOB, ABG hypoxic. D/t high risk for intubation, per Dr. Delton Coombes, recs for transport to [facility] while stable.  Consultants:  none  Procedures: none      ASSESSMENT & PLAN:   Principal Problem:   Aspiration pneumonia (HCC) Active Problems:   Depression, recurrent (HCC)   GAD (generalized anxiety disorder)   Acute respiratory failure with hypoxia (HCC)   Polysubstance abuse (HCC)   Acute metabolic encephalopathy   Acute metabolic/toxic encephalopathy d/t PNA/Hypoxis as well as polysubstance  Treat underlying causes as below   Aspiration pneumonia (HCC) Right side multi-focal pneumonia Unasyn started in ED, but patient does have ampicillin allergy Continue Rocephin Culture sputum Continue albuterol as needed Supplemental O2 Bronchodilators prn Worsening this afternoon in terms of WOB, SpO2 93% 7L HFNC repeat CXR --> appears worsening infiltrate ABG pending   Chest pain Tachycardia Suspect costochondritis complicated by  GERD Likely component of o9piate withdrawal  High risk for cardiac however  Reproducible on palpation  Troponin pending  CXR EKG prn   Polysubstance abuse (HCC) Likely opiate withdrawal  Positive for benzos, cocaine, and THC Question false negative opiate screen, certain fentanyl needs special UDS Reportedly taking xanax and opiates unprescribed PDMP no Rx on file Son states she's taking "who knows what" street drugs  TOC consult avoid controlled substances when possible  Headache, mild Tylenol, Ibuprofen   Sore throat Cepacol and chloraseptic prn   Acute hypoxic respiratory failure d/t PNA/encephalopathy as above  Supplemental O2 as needed  GAD (generalized anxiety disorder) Depression, recurrent (HCC) Continue celexa Monitor for withdrawal from benzos - although not prescribed, they are in her urine. The chronicity of her use is unknown.    DVT prophylaxis: heparin  Pertinent IV fluids/nutrition: LR 1L today 100 ml/h x10h. Heart healthy diet  Central lines / invasive devices: none  Code Status: FULL CODE ACP documentation reviewed: nont on file   Current Admission Status: obs, will need inpatient   TOC needs / Dispo plan: pend clinical improvement but expect home Barriers to discharge / significant pending items: wean off O2, BP improved

## 2023-06-26 NOTE — Progress Notes (Addendum)
21:10 Patient arrived via carelink to 21m07, on bipap, very anxious. Providers at bedside, precedex gtt ordered with relief at first.   22:00 Son and daughter in law at bedside. Patient resting comfortably, bipap switched to HFNC. Family left around 22:40.  22:45 Patient became extremely anxious, diaphoretic, sats as low as 84%, mouth breathing, respirations 22-31, can not sit still in the bed, occasionally putting oxygen out of nose. Coached patient on slow deep breaths. Respiratory called to bedside.   23:08 Provider made aware of increased anxiety and desaturations despite precedex titrated per order. Ativan 1mg  ordered and given with relief.   23:30 Patient resting comfortably, no longer anxious. O2 saturations in high 90s.

## 2023-06-26 NOTE — Progress Notes (Signed)
eLink Physician-Brief Progress Note Patient Name: Casey [name] DOB: 1961-07-06 MRN: [id number]   Date of Service  06/26/2023  HPI/Events of Note  62 yr old female transferred from [facility] with multifocal pneumonia in setting of polysubstance abuse.  She has significant oxygen needs and she is restless.  At risk of needing intubation.  eICU Interventions  Chart reviewed     Intervention Category Evaluation Type: New Patient Evaluation  Henry Russel, P 06/26/2023, 11:24 PM

## 2023-06-26 NOTE — Assessment & Plan Note (Signed)
-  2/2 aspiration pneumonia -See separate assessment and plan -wean off O2 as tolerated -Albuterol PRN -Continue to monitor

## 2023-06-26 NOTE — Progress Notes (Signed)
Changed patient care with [name] Gens due to family request. [name] Gens will now be assuming care of patient

## 2023-06-26 NOTE — Plan of Care (Signed)

## 2023-06-26 NOTE — Progress Notes (Addendum)
Pharmacy Antibiotic Note  Casey [name] is a 62 y.o. female admitted on 06/25/2023 with concern for aspiration pneumonia. Given clinical worsening with MRSA PCR screen positive, antibiotics were broadened. Pharmacy has been consulted for vancomycin and cefepime dosing.  Plan: Vancomycin 1500mg  x 1 load followed by vancomycin 1500mg  q24h (eAUC 534, goal 400-600) Cefepime 2g q8h F/u renal function, micro data, and length of therapy Vancomycin levels as needed  Height: 5\' 5"  (165.1 cm) Weight: 67.9 kg (149 lb 11.1 oz) IBW/kg (Calculated) : 57  Temp (24hrs), Avg:98.6 F (37 C), Min:97.1 F (36.2 C), Max:101.6 F (38.7 C)  Recent Labs  Lab 06/25/23 2035 06/26/23 0342  WBC 10.6* 22.8*  CREATININE 0.76 0.69    Estimated Creatinine Clearance: 63.9 mL/min (by C-G formula based on SCr of 0.69 mg/dL).    Allergies  Allergen Reactions   Ampicillin Other (See Comments)    Reaction is unknown    Antimicrobials this admission: Unasyn 8/16 x 1 Ceftriaxone 8/17 x 1 Vancomycin 8/17 > Cefepime 8/17 >   Dose adjustments this admission:  Microbiology results: 8/17 Sputum: IP  8/17 MRSA PCR: detected  Thank you for allowing pharmacy to be a part of this patient's care.  Marja Kays 06/26/2023 9:28 PM

## 2023-06-26 NOTE — Assessment & Plan Note (Signed)
-  Positive for benzos, cocaine, and THC -TOC consult -avoid controlled substances when possible

## 2023-06-26 NOTE — Assessment & Plan Note (Signed)
-  altered mental status- hypersomnolent -Cocaine, benzos, and THC on UDS -Reportedly taking xanax and opiates unprescribed -Hold sedating medications
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
## 2023-06-26 NOTE — Assessment & Plan Note (Signed)
-  Continue celexa -Monitor for withdrawal from benzos - although not prescribed, they are in her urine. The chronicity of her use is unknown.

## 2023-06-26 NOTE — Progress Notes (Addendum)
PROGRESS NOTE    [name]   JOA:[id number] DOB: Nov 26, 1960  DOA: 06/25/2023 Date of Service: 06/26/23 PCP: Patient, No Pcp Per     Brief Narrative / Hospital Course:  Casey [name] is a 62 y.o. female with medical history significant of anxiety, chronic pain, depression, suicide attempt, presents the ED with a chief complaint of altered mental status. Per H&P noted CC was "shortness of breath, and she is requiring oxygen, but the ED reported that she was brought in for unresponsiveness. When asked the patient herself she says she does not know why she was brought in and she cannot tell me what she remembers prior to being brought into the hospital. Daughter had reported to the ED provider that patient was taking Xanax and opiates that were not prescribed."  08/16: in evening, to ED, UDS(+) cocaine, THC, benzo. CXR concern for multifocal PNA. Given narcan which improved alertness and then caused agitation.  08/17: early AM admitted to hospitalist service for aspiration pneumonia Requiring HFNC. Alert this morning. WOB increasing throughout afternoon and CXR appears worse. Reached out to PCCM, will trial BiPap here, ABG pending, may need to consider transfer to [facility] ICU.   Consultants:  none  Procedures: none      ASSESSMENT & PLAN:   Principal Problem:   Aspiration pneumonia (HCC) Active Problems:   Depression, recurrent (HCC)   GAD (generalized anxiety disorder)   Acute respiratory failure with hypoxia (HCC)   Polysubstance abuse (HCC)   Acute metabolic encephalopathy   Acute metabolic/toxic encephalopathy d/t PNA/Hypoxis as well as polysubstance  Treat underlying causes as below   Aspiration pneumonia (HCC) Right side multi-focal pneumonia Unasyn started in ED, but patient does have ampicillin allergy Continue Rocephin Culture sputum Continue albuterol as needed Supplemental O2 Bronchodilators prn Worsening this afternoon in terms of WOB, SpO2 93% 7L  HFNC repeat CXR --> appears worsening infiltrate ABG pending   Chest pain Tachycardia Suspect costochondritis complicated by GERD Likely component of o9piate withdrawal  High risk for cardiac however  Reproducible on palpation  Troponin pending  CXR EKG prn   Polysubstance abuse (HCC) Likely opiate withdrawal  Positive for benzos, cocaine, and THC Question false negative opiate screen, certain fentanyl needs special UDS Reportedly taking xanax and opiates unprescribed PDMP no Rx on file Son states she's taking "who knows what" street drugs  TOC consult avoid controlled substances when possible  Headache, mild Tylenol, Ibuprofen   Sore throat Cepacol and chloraseptic prn   Acute hypoxic respiratory failure d/t PNA/encephalopathy as above  Supplemental O2 as needed  GAD (generalized anxiety disorder) Depression, recurrent (HCC) Continue celexa Monitor for withdrawal from benzos - although not prescribed, they are in her urine. The chronicity of her use is unknown.    DVT prophylaxis: heparin  Pertinent IV fluids/nutrition: LR 1L today 100 ml/h x10h. Heart healthy diet  Central lines / invasive devices: none  Code Status: FULL CODE [facility] documentation reviewed: nont on file   Current Admission Status: obs, will need inpatient   TOC needs / Dispo plan: pend clinical improvement but expect home Barriers to discharge / significant pending items: wean off O2, BP improved              Subjective / Brief ROS:  Patient reports this afternoon SOB and CP worse from this morning Reports no concerns w/ urination/defecation.   Family Communication: spoke w/ son this afternoon on the phone approx 4:45    Objective Findings:  Vitals:  06/26/23 1545 06/26/23 1600 06/26/23 1632 06/26/23 1705  BP:  132/73    Pulse:  (!) 123 (!) 116   Resp:  (!) 28 (!) 22   Temp:    98.2 F (36.8 C)  TempSrc:    Oral  SpO2: 91% (!) 88% 93%   Weight:      Height:         Intake/Output Summary (Last 24 hours) at 06/26/2023 1712 Last data filed at 06/26/2023 1500 Gross per 24 hour  Intake 1194.37 ml  Output 950 ml  Net 244.37 ml   Filed Weights   06/25/23 2042 06/26/23 0353  Weight: 61 kg 67.9 kg    Examination:  Physical Exam Constitutional:      General: She is in acute distress.     Appearance: She is ill-appearing.  Cardiovascular:     Rate and Rhythm: Regular rhythm. Tachycardia present.  Pulmonary:     Effort: Tachypnea and accessory muscle usage present.     Breath sounds: Examination of the right-upper field reveals wheezing and rhonchi. Examination of the left-upper field reveals wheezing and rhonchi. Examination of the right-middle field reveals wheezing and rhonchi. Examination of the left-middle field reveals wheezing and rhonchi. Wheezing and rhonchi present.  Musculoskeletal:     Right lower leg: No edema.     Left lower leg: No edema.  Neurological:     General: No focal deficit present.     Mental Status: She is alert and oriented to person, place, and time.  Psychiatric:        Mood and Affect: Mood is anxious.          Scheduled Medications:   Chlorhexidine Gluconate Cloth  6 each Topical Daily   citalopram  20 mg Oral Daily   guaiFENesin  600 mg Oral BID   heparin  5,000 Units Subcutaneous Q8H   ipratropium-albuterol  3 mL Nebulization QID   mupirocin ointment   Nasal BID    Continuous Infusions:  cefTRIAXone (ROCEPHIN)  IV 2 g (06/26/23 0131)   lactated ringers 100 mL/hr at 06/26/23 1109   methocarbamol (ROBAXIN) IV      PRN Medications:  acetaminophen **OR** acetaminophen, albuterol, hydrOXYzine, ketorolac, menthol-cetylpyridinium, methocarbamol (ROBAXIN) IV, ondansetron **OR** ondansetron (ZOFRAN) IV, mouth rinse, phenol  Antimicrobials from admission:  Anti-infectives (From admission, onward)    Start     Dose/Rate Route Frequency Ordered Stop   06/26/23 0200  cefTRIAXone (ROCEPHIN) 2 g in sodium  chloride 0.9 % 100 mL IVPB        2 g 200 mL/hr over 30 Minutes Intravenous Every 24 hours 06/25/23 2343     06/25/23 2145  Ampicillin-Sulbactam (UNASYN) 3 g in sodium chloride 0.9 % 100 mL IVPB        3 g 200 mL/hr over 30 Minutes Intravenous  Once 06/25/23 2138 06/25/23 2300           Data Reviewed:  I have personally reviewed the following...  CBC: Recent Labs  Lab 06/25/23 2035 06/26/23 0342  WBC 10.6* 22.8*  NEUTROABS 4.3 20.2*  HGB 13.2 12.6  HCT 40.7 38.6  MCV 85.7 85.2  PLT 414* 390   Basic Metabolic Panel: Recent Labs  Lab 06/25/23 2035 06/26/23 0342  NA 136 135  K 3.5 3.9  CL 104 105  CO2 22 21*  GLUCOSE 112* 153*  BUN 7* 8  CREATININE 0.76 0.69  CALCIUM 8.5* 7.8*  MG  --  1.7   GFR: Estimated Creatinine Clearance:  63.9 mL/min (by C-G formula based on SCr of 0.69 mg/dL). Liver Function Tests: Recent Labs  Lab 06/25/23 2035 06/26/23 0342  AST 13* 27  ALT 11 17  ALKPHOS 89 80  BILITOT 0.5 0.6  PROT 7.5 6.2*  ALBUMIN 4.0 3.1*   No results for input(s): "LIPASE", "AMYLASE" in the last 168 hours. No results for input(s): "AMMONIA" in the last 168 hours. Coagulation Profile: No results for input(s): "INR", "PROTIME" in the last 168 hours. Cardiac Enzymes: No results for input(s): "CKTOTAL", "CKMB", "CKMBINDEX", "TROPONINI" in the last 168 hours. BNP (last 3 results) No results for input(s): "PROBNP" in the last 8760 hours. HbA1C: No results for input(s): "HGBA1C" in the last 72 hours. CBG: No results for input(s): "GLUCAP" in the last 168 hours. Lipid Profile: No results for input(s): "CHOL", "HDL", "LDLCALC", "TRIG", "CHOLHDL", "LDLDIRECT" in the last 72 hours. Thyroid Function Tests: No results for input(s): "TSH", "T4TOTAL", "FREET4", "T3FREE", "THYROIDAB" in the last 72 hours. Anemia Panel: No results for input(s): "VITAMINB12", "FOLATE", "FERRITIN", "TIBC", "IRON", "RETICCTPCT" in the last 72 hours. Most Recent Urinalysis On File:      Component Value Date/Time   COLORURINE YELLOW 06/26/2023 0134   APPEARANCEUR CLEAR 06/26/2023 0134   LABSPEC 1.014 06/26/2023 0134   PHURINE 5.0 06/26/2023 0134   GLUCOSEU NEGATIVE 06/26/2023 0134   HGBUR SMALL (A) 06/26/2023 0134   BILIRUBINUR NEGATIVE 06/26/2023 0134   KETONESUR NEGATIVE 06/26/2023 0134   PROTEINUR NEGATIVE 06/26/2023 0134   UROBILINOGEN 0.2 05/02/2009 1015   NITRITE NEGATIVE 06/26/2023 0134   LEUKOCYTESUR NEGATIVE 06/26/2023 0134   Sepsis Labs: @LABRCNTIP (procalcitonin:4,lacticidven:4) Microbiology: Recent Results (from the past 240 hour(s))  MRSA Next Gen by PCR, Nasal     Status: Abnormal   Collection Time: 06/26/23 12:25 AM   Specimen: Nasal Mucosa; Nasal Swab  Result Value Ref Range Status   MRSA by PCR Next Gen DETECTED (A) NOT DETECTED Final    Comment: RESULT CALLED TO, READ BACK BY AND VERIFIED WITH: JEFF S. @ 1136 ON [id number] BY HENDERSON L (NOTE) The GeneXpert MRSA Assay (FDA approved for NASAL specimens only), is one component of a comprehensive MRSA colonization surveillance program. It is not intended to diagnose MRSA infection nor to guide or monitor treatment for MRSA infections. Test performance is not FDA approved in patients less than 72 years old. Performed at [facility], [address], [place], [place] [address]       Radiology Studies last 3 days: DG CHEST PORT 1 VIEW  Result Date: 06/26/2023 CLINICAL DATA:  Tachypnea EXAM: PORTABLE CHEST 1 VIEW COMPARISON:  06/25/2023 FINDINGS: Multifocal pulmonary infiltrates have rapidly progressed in the interval, asymmetrically more severe throughout the right lung, in keeping with progressive multifocal pneumonic infiltrate. No pneumothorax or pleural effusion. Cardiac size within normal limits. No acute bone abnormality. IMPRESSION: 1. Rapid interval progression of multifocal pneumonic infiltrate. Electronically Signed   By: Helyn Numbers M.D.   On: 06/26/2023 01:11   DG Chest 2  View  Result Date: 06/25/2023 CLINICAL DATA:  Dyspnea EXAM: CHEST - 2 VIEW COMPARISON:  09/04/2019 FINDINGS: Asymmetric bibasilar interstitial and airspace infiltrates are present, more focal within the right mid lung zone in keeping with changes of multifocal pneumonia and/or aspiration in the appropriate clinical setting. No pneumothorax or pleural effusion. Cardiac size within normal limits. Pulmonary vascularity is normal. No acute bone abnormality. Left total shoulder arthroplasty is partially visualized. IMPRESSION: 1. Asymmetric bibasilar interstitial and airspace infiltrates, more focal within the right mid lung zone  in keeping with changes of multifocal pneumonia and/or aspiration in the appropriate clinical setting. Electronically Signed   By: Helyn Numbers M.D.   On: 06/25/2023 21:31             LOS: 0 days    Time spent: 35 min critical care time     Sunnie Nielsen, DO [facility] 06/26/2023, 5:12 PM    Dictation software may have been used to generate the above note. Typos may occur and escape review in typed/dictated notes. Please contact Dr Lyn Hollingshead directly for clarity if needed.  Staff may message me via secure chat in Epic  but this may not receive an immediate response,  please page me for urgent matters!  If 7PM-7AM, please contact night coverage [email or web address]

## 2023-06-26 NOTE — H&P (Signed)
History and Physical    Patient: Casey [name] WUJ:[id number] DOB: 04/16/1961 DOA: 06/25/2023 DOS: the patient was seen and examined on 06/26/2023 PCP: Patient, No Pcp Per  Patient coming from: Home  Chief Complaint:  Chief Complaint  Patient presents with   Shortness of Breath   HPI: Casey [name] is a 62 y.o. female with medical history significant of anxiety, chronic pain, depression, suicide attempt, and more presents the ED with a chief complaint of altered mental status.  Registration.  Chief complaint of shortness of breath, and she is requiring oxygen, but the ED reported that she was brought in for unresponsiveness.  When asked the patient herself she says she does not know why she was brought in and she cannot tell me what she remembers prior to being brought into the hospital.  Daughter had reported to the ED provider that patient was taking Xanax and opiates that were not prescribed.  Patient does not actually have any opiates in her system, but does have benzos.  She also has cocaine and THC in her system.  Her Tylenol level is undetectable.  Patient is maintaining oxygen sats in 6%, but her lungs are rhonchorous.  She is hypersomnolent and there is no family at bedside so no further history could be obtained at this time. Review of Systems: unable to review all systems due to the inability of the patient to answer questions. Past Medical History:  Diagnosis Date   Anxiety    Chronic back pain    Depression    History of panic attacks 09/11/2014   Medical history non-contributory    Ruptured disc, thoracic    Suicide attempt (HCC) 2002   Past Surgical History:  Procedure Laterality Date   CESAREAN SECTION     CLAVICLE SURGERY     REVERSE SHOULDER ARTHROPLASTY Left 01/01/2020   Procedure: LEFT REVERSE SHOULDER ARTHROPLASTY;  Surgeon: Cammy Copa, MD;  Location: [facility];  Service: Orthopedics;  Laterality: Left;   TUBAL LIGATION     Social History:  reports that she  has been smoking cigarettes. She has never used smokeless tobacco. She reports current alcohol use of about 1.0 standard drink of alcohol per week. She reports that she does not currently use drugs after having used the following drugs: "Crack" cocaine and Benzodiazepines.  Allergies  Allergen Reactions   Ampicillin Other (See Comments)    Reaction is unknown    Family History  Problem Relation Age of Onset   Diabetes Mother    Pulmonary fibrosis Mother    Heart attack Father    Parkinson's disease Sister    Bipolar disorder Sister    Thyroid disease Sister    Heart attack Brother    Other Son        vascular neucrosis   Pulmonary fibrosis Maternal Grandmother    Diabetes Maternal Grandfather    Stroke Maternal Grandfather    Pneumonia Paternal Grandfather    Cancer Sister        melonoma   Other Brother        back problems, nerve problems, soft bones    Prior to Admission medications   Medication Sig Start Date End Date Taking? Authorizing Provider  citalopram (CELEXA) 20 MG tablet TAKE 1 TABLET BY MOUTH EVERY DAY 03/01/23  Yes Rakes, Doralee Albino, FNP  hydrOXYzine (ATARAX) 50 MG tablet TAKE 1 TABLET BY MOUTH THREE TIMES A DAY AS NEEDED 04/19/23  Yes Rakes, Doralee Albino, FNP    Physical Exam:  Vitals:   06/26/23 0254 06/26/23 0353 06/26/23 0400 06/26/23 0451  BP:   (!) 131/59   Pulse:   (!) 104   Resp:   18   Temp: 100.3 F (37.9 C) (!) 101.2 F (38.4 C)  (!) 101.6 F (38.7 C)  TempSrc: Oral Axillary  Axillary  SpO2:   91%   Weight:  67.9 kg    Height:       1.  General: Patient lying supine in bed, head of bed elevated, no acute distress   2. Psychiatric: Hypersomnolent and does not know why she is in the hospital   3. Neurologic: Hypersomnolent, speech and language are normal, face is symmetric, moves all 4 extremities voluntarily   4. HEENMT:  Head is atraumatic, normocephalic, pupils reactive to light, neck is supple, trachea is midline, mucous membranes are  moist   5. Respiratory : Rhonchi bilaterally, wheezing bilaterally, maintaining oxygen sats on 6 L nasal cannula   6. Cardiovascular : Heart rate normal, rhythm is regular, no murmurs, rubs or gallops, no peripheral edema, peripheral pulses palpated   7. Gastrointestinal:  Abdomen is soft, nondistended, nontender to palpation bowel sounds active, no masses or organomegaly palpated   8. Skin:  Skin is warm, dry and intact without rashes, acute lesions, or ulcers on limited exam   9.Musculoskeletal:  No acute deformities or trauma, no asymmetry in tone, no peripheral edema, peripheral pulses palpated, no tenderness to palpation in the extremities  Data Reviewed: In the ED, patient is afebrile - Since being admitted patient has had a fever Heart rate normal, respiratory rate initially normal and then tachypneic before transfer to floor, hypoxic down to 87% corrected on 6 L nasal cannula Borderline leukocytosis at 10.6, hemoglobin 13.2 Chemistries unremarkable Chest x-ray shows asymmetric bibasilar interstitial and airspace infiltrates more on the right indicative of multifocal pneumonia EKG shows a heart rate of 66, sinus rhythm, QTc 479 It was reported the patient was given Narcan and then woke up a bit so she was given a second dose of Narcan and became agitated Admission requested for altered mental status, hypoxia, suspected aspiration pneumonia Assessment and Plan: * Aspiration pneumonia (HCC) -Unresponsive episode followed by hypoxia and rhonchi -Right side multi-focal pneumonia -Unasyn started in ED, but patient does have ampicillin allergy -Continue Rocephin -Culture sputum -Continue albuterol as needed -RT eval and treat   Acute metabolic encephalopathy -altered mental status- hypersomnolent -Cocaine, benzos, and THC on UDS -Reportedly taking xanax and opiates unprescribed -Hold sedating medications  Polysubstance abuse (HCC) -Positive for benzos, cocaine, and  THC -TOC consult -avoid controlled substances when possible  Acute respiratory failure with hypoxia (HCC) -2/2 aspiration pneumonia -See separate assessment and plan -wean off O2 as tolerated -Albuterol PRN -Continue to monitor  GAD (generalized anxiety disorder) -Continue celexa -Monitor for withdrawal from benzos - although not prescribed, they are in her urine. The chronicity of her use is unknown.  Depression, recurrent (HCC) -Continue celexa      Advance Care Planning:   Code Status: Full Code  Consults: None at this time  Family Communication: No family at bedside  Severity of Illness: The appropriate patient status for this patient is OBSERVATION. Observation status is judged to be reasonable and necessary in order to provide the required intensity of service to ensure the patient's safety. The patient's presenting symptoms, physical exam findings, and initial radiographic and laboratory data in the context of their medical condition is felt to place them at decreased risk for further  clinical deterioration. Furthermore, it is anticipated that the patient will be medically stable for discharge from the hospital within 2 midnights of admission.   Author: Lilyan Gilford, DO 06/26/2023 5:27 AM  For on call review [email or web address].

## 2023-06-26 NOTE — Assessment & Plan Note (Signed)
Continue celexa

## 2023-06-26 NOTE — H&P (Signed)
NAME:  Casey [name], MRN:  [id number], DOB:  [birth year], LOS: 0 ADMISSION DATE:  06/25/2023 CONSULTATION DATE:  06/26/2023 REFERRING MD:  Lyn Hollingshead - TRH ([facility]) CHIEF COMPLAINT:  AMS, SOB  History of Present Illness:  62 year old woman who presented to [facility] 8/17 as a transfer from [facility] ICU for AMS, hypoxia requiring BiPAP and possible aspiration PNA. PMHx significant for chronic pain, anxiety/depression, panic disorder, suicide attempt, polysubstance abuse.  Patient initially presented to [facility] ED 8/16 after being found at home unresponsive by family. EMS was called, administered Narcan 2mg  IN with improvement in mental status. Per chart review "possible took two Xanax that weren't Xanax". Patient initially refused transport to hospital but eventually agreed. Drowsy with garbled speech with EMS but able to follow commands. Hypoxic to 86% on RA, initially placed on [place] but escalated to NRB with improvement in SpO2 to 96%. In ED, patient was hypothermic, tachycardic to 100s, BP 139/95, RR 38, SpO2 89% on NRB. Labs were notable for WBC 10.6, Hgb 13.2, Plt 414. Na 136, K 3.5, CO2 22, Cr 0.76, LFTs WNL. UA unremarkable. Ethanol and APAP levels negative. UDS+ cocaine, BZD and THC. CXR demonstrated asymmetric bibasilar interstitial and airspace infiltrates c/f multifocal PNA/aspiration. Unasyn ordered in ED but changed to ceftriaxone due to ?allergy. Patient was subsequently admitted to [facility].  On 8/17, patient was noted to have persistent respiratory distress with escalating O2 requirements to HFNC and eventually BiPAP. CXR appeared worse with rapid progression of diffuse bilateral heterogeneous consolidative airspace disease in LUL and R lung, c/w worsened multifocal PNA and small layering R pleural effusion.   PCCM consulted for transfer to [facility] for further care.  Pertinent Medical History:   Past Medical History:  Diagnosis Date   Anxiety    Chronic back pain    Depression    History of panic attacks  09/11/2014   Medical history non-contributory    Polysubstance abuse (HCC)    History of taking non-prescribed opiates, BZDs; also +cocaine and THC   Ruptured disc, thoracic    Suicide attempt (HCC) 2002   Significant Hospital Events: Including procedures, antibiotic start and stop dates in addition to other pertinent events   8/16 - Presented to [facility] via EMS for AMS, hypoxia. Presumed drug intoxication. UDS +cocaine, BZDs, THC. CXR with multifocal PNA. Required [place] then NRB then HFNC. Empiric ceftriaxone. 8/17 - Worsening respiratory status requiring escalation to BiPAP. ABG poor. CXR worse. Transferred to [facility] for further care.  Interim History / Subjective:  PCCM consulted for transfer to [facility] for further care.  Objective:  Blood pressure (!) 152/72, pulse (!) 120, temperature 98.3 F (36.8 C), temperature source Axillary, resp. rate (!) 21, height 5\' 5"  (1.651 m), weight 67.9 kg, SpO2 97%.    FiO2 (%):  [40 %-60 %] 60 %   Intake/Output Summary (Last 24 hours) at 06/26/2023 2140 Last data filed at 06/26/2023 1959 Gross per 24 hour  Intake 1194.37 ml  Output 2050 ml  Net -855.63 ml   Filed Weights   06/25/23 2042 06/26/23 0353  Weight: 61 kg 67.9 kg   Physical Examination: General: Acutely ill-appearing middle-aged woman in NAD. Appears extremely anxious. HEENT: [place]/AT, anicteric sclera, PERRL, moist mucous membranes. Neuro: Awake, oriented x 4. Responds to verbal stimuli. Following commands consistently. Moves all 4 extremities spontaneously. CV: Tachycardic, regular rhythm, no m/g/r. PULM: Breathing tachypneic and mildly labored on BiPAP. Lung fields with coarse rhonchi bilaterally, R > L, expiratory wheeze. GI: Soft, nontender, nondistended. Extremities: No  LE edema noted. Skin: Warm/dry, no rashes.  Resolved Hospital Problem List:    Assessment & Plan:  Acute hypoxemic respiratory failure in the setting of multifocal PNA - Transfer to [facility] ICU for further care - BiPAP  continuous for now, then PRN - Supplemental O2 support, may require HHFNC - Wean O2 for sat > 90% - Solumedrol 125mg  once, then 40mg  BID x 5 days - Bronchodilators (Brovana/Yupelri/Pulmicort, DuoNeb PRN) - Pulmonary hygiene - Follow ABG/CXR - Precedex for BiPAP toleration in the setting of baseline GAD/panic disorder  Multifocal PNA, presumed aspiration Leukocytosis in the setting of above Small R pleural effusion, ?parapneumonic - Trend WBC, fever curve - F/u Cx data - Continue broad-spectrum antibiotics (cefepime, vanc) - Follow CXR  Chronic pain PDMP reviewed, patient does not have any controlled substance prescriptions prescribed to her at this time. - Avoid sedating medications as able - Maximize non-opioid adjuncts (APAP, lido patches, etc)  Polysubstance abuse UDS +cocaine, BZDs, THC. History of taking non-prescribed opiates/BZDs per family. - Encourage cessation - Monitor for signs/symptoms of withdrawal - TOC consult for cessation resources  GAD with panic disorder Depression - Continue home Celexa - Resume home Atarax as appropriate, may require anxiolytic to tolerate BiPAP mask - Consider Psych consult while inpatient  Best Practice: (right click and "Reselect all SmartList Selections" daily)   Diet/type: NPO while on BiPAP DVT prophylaxis: SCDs, SQH GI prophylaxis: PPI Lines: N/A Foley:  N/A Code Status:  full code Last date of multidisciplinary goals of care discussion [Pending]  Labs:  CBC: Recent Labs  Lab 06/25/23 2035 06/26/23 0342  WBC 10.6* 22.8*  NEUTROABS 4.3 20.2*  HGB 13.2 12.6  HCT 40.7 38.6  MCV 85.7 85.2  PLT 414* 390   Basic Metabolic Panel: Recent Labs  Lab 06/25/23 2035 06/26/23 0342  NA 136 135  K 3.5 3.9  CL 104 105  CO2 22 21*  GLUCOSE 112* 153*  BUN 7* 8  CREATININE 0.76 0.69  CALCIUM 8.5* 7.8*  MG  --  1.7   GFR: Estimated Creatinine Clearance: 63.9 mL/min (by C-G formula based on SCr of 0.69 mg/dL). Recent  Labs  Lab 06/25/23 2035 06/26/23 0342  WBC 10.6* 22.8*   Liver Function Tests: Recent Labs  Lab 06/25/23 2035 06/26/23 0342  AST 13* 27  ALT 11 17  ALKPHOS 89 80  BILITOT 0.5 0.6  PROT 7.5 6.2*  ALBUMIN 4.0 3.1*   No results for input(s): "LIPASE", "AMYLASE" in the last 168 hours. No results for input(s): "AMMONIA" in the last 168 hours.  ABG:    Component Value Date/Time   PHART 7.42 06/26/2023 1700   PCO2ART 35 06/26/2023 1700   PO2ART 65 (L) 06/26/2023 1700   HCO3 23.4 06/26/2023 1700   ACIDBASEDEF 0.7 06/26/2023 1700   O2SAT 96 06/26/2023 1700    Coagulation Profile: No results for input(s): "INR", "PROTIME" in the last 168 hours.  Cardiac Enzymes: No results for input(s): "CKTOTAL", "CKMB", "CKMBINDEX", "TROPONINI" in the last 168 hours.  HbA1C: No results found for: "HGBA1C"  CBG: Recent Labs  Lab 06/26/23 2107  GLUCAP 129*   Review of Systems:   Review of Systems  Constitutional:  Positive for chills and malaise/fatigue. Negative for fever and weight loss.  HENT:  Negative for congestion and sore throat.   Respiratory:  Positive for cough, shortness of breath and wheezing. Negative for hemoptysis and sputum production.   Cardiovascular:  Positive for chest pain and palpitations. Negative for leg swelling.  Gastrointestinal:  Negative for abdominal pain, diarrhea, nausea and vomiting.  Musculoskeletal:  Negative for myalgias.  Neurological:  Positive for weakness. Negative for dizziness.  Psychiatric/Behavioral:  Positive for depression. The patient is nervous/anxious.    Past Medical History:  She,  has a past medical history of Anxiety, Chronic back pain, Depression, History of panic attacks (09/11/2014), Medical history non-contributory, Polysubstance abuse (HCC), Ruptured disc, thoracic, and Suicide attempt (HCC) (2002).   Surgical History:   Past Surgical History:  Procedure Laterality Date   CESAREAN SECTION     CLAVICLE SURGERY      REVERSE SHOULDER ARTHROPLASTY Left 01/01/2020   Procedure: LEFT REVERSE SHOULDER ARTHROPLASTY;  Surgeon: Cammy Copa, MD;  Location: [facility] OR;  Service: Orthopedics;  Laterality: Left;   TUBAL LIGATION      Social History:   reports that she has been smoking cigarettes. She has never used smokeless tobacco. She reports current alcohol use of about 1.0 standard drink of alcohol per week. She reports that she does not currently use drugs after having used the following drugs: "Crack" cocaine and Benzodiazepines.   Family History:  Her family history includes Bipolar disorder in her sister; Cancer in her sister; Diabetes in her maternal grandfather and mother; Heart attack in her brother and father; Other in her brother and son; Parkinson's disease in her sister; Pneumonia in her paternal grandfather; Pulmonary fibrosis in her maternal grandmother and mother; Stroke in her maternal grandfather; Thyroid disease in her sister.   Allergies: Allergies  Allergen Reactions   Ampicillin Other (See Comments)    Reaction is unknown    Home Medications: Prior to Admission medications   Medication Sig Start Date End Date Taking? Authorizing Provider  citalopram (CELEXA) 20 MG tablet TAKE 1 TABLET BY MOUTH EVERY DAY 03/01/23  Yes Rakes, Doralee Albino, FNP  hydrOXYzine (ATARAX) 50 MG tablet TAKE 1 TABLET BY MOUTH THREE TIMES A DAY AS NEEDED 04/19/23  Yes Rakes, Doralee Albino, FNP   Critical care time:   The patient is critically ill with multiple organ system failure and requires high complexity decision making for assessment and support, frequent evaluation and titration of therapies, advanced monitoring, review of radiographic studies and interpretation of complex data.   Critical Care Time devoted to patient care services, exclusive of separately billable procedures, described in this note is 37 minutes.  Tim Lair, PA-C [place] 06/26/23 9:40 PM  Please see [email or web address] for  pager details.  From 7A-7P if no response, please call [phone] After hours, please call ELink [phone]

## 2023-06-27 ENCOUNTER — Inpatient Hospital Stay (HOSPITAL_COMMUNITY): Payer: MEDICAID

## 2023-06-27 DIAGNOSIS — J690 Pneumonitis due to inhalation of food and vomit: Secondary | ICD-10-CM | POA: Diagnosis not present

## 2023-06-27 LAB — CBC
HCT: 36.5 % (ref 36.0–46.0)
Hemoglobin: 12 g/dL (ref 12.0–15.0)
MCH: 28.1 pg (ref 26.0–34.0)
MCHC: 32.9 g/dL (ref 30.0–36.0)
MCV: 85.5 fL (ref 80.0–100.0)
Platelets: 311 10*3/uL (ref 150–400)
RBC: 4.27 MIL/uL (ref 3.87–5.11)
RDW: 13.7 % (ref 11.5–15.5)
WBC: 23.6 10*3/uL — ABNORMAL HIGH (ref 4.0–10.5)
nRBC: 0 % (ref 0.0–0.2)

## 2023-06-27 LAB — BASIC METABOLIC PANEL
Anion gap: 8 (ref 5–15)
BUN: 10 mg/dL (ref 8–23)
CO2: 21 mmol/L — ABNORMAL LOW (ref 22–32)
Calcium: 8.3 mg/dL — ABNORMAL LOW (ref 8.9–10.3)
Chloride: 107 mmol/L (ref 98–111)
Creatinine, Ser: 0.69 mg/dL (ref 0.44–1.00)
GFR, Estimated: >60 mL/min (ref >60)
Glucose, Bld: 189 mg/dL — ABNORMAL HIGH (ref 70–99)
Potassium: 4.1 mmol/L (ref 3.5–5.1)
Sodium: 136 mmol/L (ref 135–145)

## 2023-06-27 LAB — GLUCOSE, CAPILLARY
Glucose-Capillary: 157 mg/dL — ABNORMAL HIGH (ref 70–99)
Glucose-Capillary: 170 mg/dL — ABNORMAL HIGH (ref 70–99)
Glucose-Capillary: 150 mg/dL — ABNORMAL HIGH (ref 70–99)
Glucose-Capillary: 81 mg/dL (ref 70–99)
Glucose-Capillary: 133 mg/dL — ABNORMAL HIGH (ref 70–99)

## 2023-06-27 LAB — MAGNESIUM: Magnesium: 2.1 mg/dL (ref 1.7–2.4)

## 2023-06-27 LAB — PHOSPHORUS: Phosphorus: 3.1 mg/dL (ref 2.5–4.6)

## 2023-06-27 NOTE — Evaluation (Signed)
Clinical/Bedside Swallow Evaluation Patient Details  Name: Casey [name] MRN: [id number] Date of Birth: 1961-08-14  Today's Date: 06/27/2023 Time: SLP Start Time (ACUTE ONLY): 1243 SLP Stop Time (ACUTE ONLY): 1305 SLP Time Calculation (min) (ACUTE ONLY): 22 min  Past Medical History:  Past Medical History:  Diagnosis Date   Anxiety    Chronic back pain    Depression    History of panic attacks 09/11/2014   Medical history non-contributory    Polysubstance abuse (HCC)    History of taking non-prescribed opiates, BZDs; also +cocaine and THC   Ruptured disc, thoracic    Suicide attempt (HCC) 2002   Past Surgical History:  Past Surgical History:  Procedure Laterality Date   CESAREAN SECTION     CLAVICLE SURGERY     REVERSE SHOULDER ARTHROPLASTY Left 01/01/2020   Procedure: LEFT REVERSE SHOULDER ARTHROPLASTY;  Surgeon: Cammy Copa, MD;  Location: [facility] OR;  Service: Orthopedics;  Laterality: Left;   TUBAL LIGATION     HPI:  Casey [name] is a 62 yo female presenting to [facility] ED 8/16 after being found at home unresponsive. Per note, pt's son administered "at home narcan", which is vinegar, while pt was unresponsive. EMS administered Narcan with improvement in mental status. In ED, was hypothermic, tachycardic, SpO2 89% on NRB. UDS + for cocaine, BZD, and THC. CXR with asymmetric bibasilar interstitial and airspace infiltrates c/f multifocal PNA/aspiration. On 8/17, pt with persistent respiratory distress with escalating O2 requirements to HFNC and eventually BiPAP and worsening CXR with rapid progression of bilateral heterogenous consolidative airspace disease in LUL and R lung. Transferred to [facility] 8/17. PMH includes chronic pain, anxiety/depression, panic disorders, suicide attempt, polysubstance abuse    Assessment / Plan / Recommendation  Clinical Impression  Pt on 20L/min HHFNC, with SpO2 remaining stable ~95% throughout the session. She reports minimal difficulty with swallowing  at baseline, stating she occasionally gets "strangled" while drinking. Pt presents with a hoarse and wet vocal quality, coughing congestedly throughout the session and using [place] independently to expectorate secretions. Oral motor exam WFL. SLP observed pt with trials of thin liquids and purees followed by immediate coughing following the swallow.  Pt exhibits strong cough, although suspect pt could have acute sensory deficits due to adminsitration of vinegar when unresponsive. Given pt's risk factors, including decreased respiratory status and possible fluctuating mentation, recommend she remain NPO pending instrumental testing. Pt can have ice chips intermittently to loosen and mobilize secretions with nursing staff only. Essential meds can be given crushed in applesauce. Will f/u to assess readiness to complete an instrumental swallow study as her respiratory status allows.  SLP Visit Diagnosis: Dysphagia, unspecified (R13.10)    Aspiration Risk  Moderate aspiration risk    Diet Recommendation NPO except meds;Ice chips PRN after oral care    Medication Administration: Crushed with puree Postural Changes: Seated upright at 90 degrees;Remain upright for at least 30 minutes after po intake    Other  Recommendations Oral Care Recommendations: Oral care QID;Oral care prior to ice chip/H20    Recommendations for follow up therapy are one component of a multi-disciplinary discharge planning process, led by the attending physician.  Recommendations may be updated based on patient status, additional functional criteria and insurance authorization.  Follow up Recommendations Outpatient SLP      Assistance Recommended at Discharge    Functional Status Assessment Patient has had a recent decline in their functional status and demonstrates the ability to make significant improvements in function in  a reasonable and predictable amount of time.  Frequency and Duration min 2x/week  2 weeks        Prognosis Prognosis for improved oropharyngeal function: Good Barriers to Reach Goals: Cognitive deficits      Swallow Study   General HPI: Casey [name] is a 62 yo female presenting to [facility] ED 8/16 after being found at home unresponsive. Per note, pt's son administered "at home narcan", which is vinegar, while pt was unresponsive. EMS administered Narcan with improvement in mental status. In ED, was hypothermic, tachycardic, SpO2 89% on NRB. UDS + for cocaine, BZD, and THC. CXR with asymmetric bibasilar interstitial and airspace infiltrates c/f multifocal PNA/aspiration. On 8/17, pt with persistent respiratory distress with escalating O2 requirements to HFNC and eventually BiPAP and worsening CXR with rapid progression of bilateral heterogenous consolidative airspace disease in LUL and R lung. Transferred to [facility] 8/17. PMH includes chronic pain, anxiety/depression, panic disorders, suicide attempt, polysubstance abuse Type of Study: Bedside Swallow Evaluation Previous Swallow Assessment: none in chart Diet Prior to this Study: NPO Temperature Spikes Noted: No Respiratory Status: Other (comment) (HHFNC) History of Recent Intubation: No Behavior/Cognition: Alert;Cooperative;Pleasant mood;Requires cueing Oral Cavity Assessment: Within Functional Limits Oral Care Completed by SLP: No Oral Cavity - Dentition: Adequate natural dentition Vision: Functional for self-feeding Self-Feeding Abilities: Able to feed self Patient Positioning: Upright in bed Baseline Vocal Quality: Wet;Hoarse Volitional Cough: Congested Volitional Swallow: Able to elicit    Oral/Motor/Sensory Function Overall Oral Motor/Sensory Function: Within functional limits   Ice Chips Ice chips: Not tested   Thin Liquid Thin Liquid: Impaired Presentation: Straw;Self Fed Pharyngeal  Phase Impairments: Cough - Immediate    Nectar Thick Nectar Thick Liquid: Not tested   Honey Thick Honey Thick Liquid: Not tested   Puree Puree:  Impaired Presentation: Spoon;Self Fed Pharyngeal Phase Impairments: Cough - Immediate   Solid     Solid: Not tested      Gwynneth Aliment, M.A., CF-SLP Speech Language Pathology, Acute Rehabilitation Services  Secure Chat preferred [phone]  06/27/2023,1:36 PM

## 2023-06-27 NOTE — Progress Notes (Signed)
NAME:  Casey [name], MRN:  [id number], DOB:  04/23/1961, LOS: 1 ADMISSION DATE:  06/25/2023 CONSULTATION DATE:  06/26/2023 REFERRING MD:  Lyn Hollingshead - TRH ([facility]) CHIEF COMPLAINT:  AMS, SOB  History of Present Illness:  62 year old woman who presented to [facility] 8/17 as a transfer from [facility] ICU for AMS, hypoxia requiring BiPAP and possible aspiration PNA. PMHx significant for chronic pain, anxiety/depression, panic disorder, suicide attempt, polysubstance abuse.  Patient initially presented to [facility] ED 8/16 after being found at home unresponsive by family. EMS was called, administered Narcan 2mg  IN with improvement in mental status. Per chart review "possible took two Xanax that weren't Xanax". Patient initially refused transport to hospital but eventually agreed. Drowsy with garbled speech with EMS but able to follow commands. Hypoxic to 86% on RA, initially placed on [place] but escalated to NRB with improvement in SpO2 to 96%. In ED, patient was hypothermic, tachycardic to 100s, BP 139/95, RR 38, SpO2 89% on NRB. Labs were notable for WBC 10.6, Hgb 13.2, Plt 414. Na 136, K 3.5, CO2 22, Cr 0.76, LFTs WNL. UA unremarkable. Ethanol and APAP levels negative. UDS+ cocaine, BZD and THC. CXR demonstrated asymmetric bibasilar interstitial and airspace infiltrates c/f multifocal PNA/aspiration. Unasyn ordered in ED but changed to ceftriaxone due to ?allergy. Patient was subsequently admitted to [facility].  On 8/17, patient was noted to have persistent respiratory distress with escalating O2 requirements to HFNC and eventually BiPAP. CXR appeared worse with rapid progression of diffuse bilateral heterogeneous consolidative airspace disease in LUL and R lung, c/w worsened multifocal PNA and small layering R pleural effusion.   PCCM consulted for transfer to [facility] for further care.  Pertinent Medical History:   Past Medical History:  Diagnosis Date   Anxiety    Chronic back pain    Depression    History of panic attacks  09/11/2014   Medical history non-contributory    Polysubstance abuse (HCC)    History of taking non-prescribed opiates, BZDs; also +cocaine and THC   Ruptured disc, thoracic    Suicide attempt (HCC) 2002   Significant Hospital Events: Including procedures, antibiotic start and stop dates in addition to other pertinent events   8/16 - Presented to [facility] via EMS for AMS, hypoxia. Presumed drug intoxication. UDS +cocaine, BZDs, THC. CXR with multifocal PNA. Required [address] then NRB then HFNC. Empiric ceftriaxone. 8/17 - Worsening respiratory status requiring escalation to BiPAP. ABG poor. CXR worse. Transferred to [facility] for further care.  Interim History / Subjective:  Seen lying in bed on heated high flow nasal cannula with request of something to drink.  Denies any acute complaints.  Reports regular daily use of Xanax that she "buys off the street", cannot tell me the dose or amount of benzodiazepine use  Objective:  Blood pressure 124/67, pulse 68, temperature 98 F (36.7 C), temperature source Oral, resp. rate 18, height 5\' 5"  (1.651 m), weight 67 kg, SpO2 99%.    FiO2 (%):  [40 %-95 %] 70 %   Intake/Output Summary (Last 24 hours) at 06/27/2023 0800 Last data filed at 06/27/2023 0600 Gross per 24 hour  Intake 1898.34 ml  Output 2500 ml  Net -601.66 ml   Filed Weights   06/25/23 2042 06/26/23 0353 06/27/23 0500  Weight: 61 kg 67.9 kg 67 kg   Physical Examination: General: Acute on chronic ill-appearing middle-aged female lying in bed on HHFNC HEENT: [place]/AT, MM pink/moist, PERRL,  Neuro: Oriented x 3, nonfocal CV: s1s2 regular rate and rhythm, no  murmur, rubs, or gallops,  PULM: Significant bilateral rhonchi and inspiratory and expiratory wheezing, mild increased work of breathing GI: soft, bowel sounds active in all 4 quadrants, non-tender, non-distended Extremities: warm/dry, no edema  Skin: no rashes or lesions  Resolved Hospital Problem List:    Assessment & Plan:  Acute  hypoxemic respiratory failure in the setting of multifocal PNA P: BiPAP as needed Supplemental oxygen for sat goal greater than 90 Aspiration precautions Continue IV Solu-Medrol Bronchodilators Encourage frequent and adequate pulmonary hygiene with frequent I-S and flutter valve  Multifocal PNA, presumed aspiration Leukocytosis in the setting of above Small R pleural effusion, ?parapneumonic P: Trend CBC and fever curve Continue cefepime and vancomycin, can likely de-escalate soon  Chronic pain -PDMP reviewed, patient does not have any controlled substance prescriptions prescribed to her at this time. P: Currently requiring Precedex drip Minimize sedation as able Monitor for signs of withdrawal Maximize nonopioid adjuncts  Polysubstance abuse -UDS +cocaine, BZDs, THC. History of taking non-prescribed opiates/BZDs per family. She reports daily use of Xanax that she "buys off the street", cannot tell me the dose or amount of benzodiazepine use P: Cessation education when appropriate Monitor for withdrawal as above TOC consult when appropriate  GAD with panic disorder Depression P: Continue home Celexa and Atarax Consider when appropriate  Best Practice: (right click and "Reselect all SmartList Selections" daily)   Diet/type: NPO while on BiPAP DVT prophylaxis: SCDs, SQH GI prophylaxis: PPI Lines: N/A Foley:  N/A Code Status:  full code Last date of multidisciplinary goals of care discussion patient updated at bedside, patient's sister also updated over the phone  Critical care time:  CRITICAL CARE Performed by: Rees Matura D. Harris   Total critical care time: 39 minutes  Critical care time was exclusive of separately billable procedures and treating other patients.  Critical care was necessary to treat or prevent imminent or life-threatening deterioration.  Critical care was time spent personally by me on the following activities: development of treatment plan  with patient and/or surrogate as well as nursing, discussions with consultants, evaluation of patient's response to treatment, examination of patient, obtaining history from patient or surrogate, ordering and performing treatments and interventions, ordering and review of laboratory studies, ordering and review of radiographic studies, pulse oximetry and re-evaluation of patient's condition.  Darrien Laakso D. Harris, NP-C [place] [facility] Personal contact information can be found on Amion  If no contact or response made please call [phone] 06/27/2023, 8:07 AM

## 2023-06-28 ENCOUNTER — Ambulatory Visit (HOSPITAL_COMMUNITY): Payer: MEDICAID | Admitting: Psychiatry

## 2023-06-28 ENCOUNTER — Encounter (HOSPITAL_COMMUNITY): Payer: Self-pay

## 2023-06-28 ENCOUNTER — Inpatient Hospital Stay (HOSPITAL_COMMUNITY): Payer: MEDICAID

## 2023-06-28 DIAGNOSIS — J9601 Acute respiratory failure with hypoxia: Secondary | ICD-10-CM | POA: Diagnosis not present

## 2023-06-28 LAB — RESPIRATORY PANEL BY PCR

## 2023-06-28 LAB — BASIC METABOLIC PANEL
Anion gap: 11 (ref 5–15)
BUN: 19 mg/dL (ref 8–23)
CO2: 20 mmol/L — ABNORMAL LOW (ref 22–32)
Calcium: 8.9 mg/dL (ref 8.9–10.3)
Chloride: 108 mmol/L (ref 98–111)
Creatinine, Ser: 0.7 mg/dL (ref 0.44–1.00)
GFR, Estimated: >60 mL/min (ref >60)
Glucose, Bld: 142 mg/dL — ABNORMAL HIGH (ref 70–99)
Potassium: 4.2 mmol/L (ref 3.5–5.1)
Sodium: 139 mmol/L (ref 135–145)

## 2023-06-28 LAB — POCT I-STAT 7, (LYTES, BLD GAS, ICA,H+H)
Acid-base deficit: 6 mmol/L — ABNORMAL HIGH (ref 0.0–2.0)
Bicarbonate: 20.7 mmol/L (ref 20.0–28.0)
Calcium, Ion: 1.19 mmol/L (ref 1.15–1.40)
HCT: 34 % — ABNORMAL LOW (ref 36.0–46.0)
Hemoglobin: 11.6 g/dL — ABNORMAL LOW (ref 12.0–15.0)
O2 Saturation: 96 %
Potassium: 3.9 mmol/L (ref 3.5–5.1)
Sodium: 142 mmol/L (ref 135–145)
TCO2: 22 mmol/L (ref 22–32)
pCO2 arterial: 44.8 mmHg (ref 32–48)
pH, Arterial: 7.273 — ABNORMAL LOW (ref 7.35–7.45)
pO2, Arterial: 92 mmHg (ref 83–108)

## 2023-06-28 LAB — BLOOD GAS, ARTERIAL
Acid-base deficit: 7.4 mmol/L — ABNORMAL HIGH (ref 0.0–2.0)
Bicarbonate: 18.1 mmol/L — ABNORMAL LOW (ref 20.0–28.0)
O2 Saturation: 97.7 %
Patient temperature: 36.7
pCO2 arterial: 36 mmHg (ref 32–48)
pH, Arterial: 7.31 — ABNORMAL LOW (ref 7.35–7.45)
pO2, Arterial: 95 mmHg (ref 83–108)

## 2023-06-28 LAB — CBC
HCT: 35.4 % — ABNORMAL LOW (ref 36.0–46.0)
Hemoglobin: 11.6 g/dL — ABNORMAL LOW (ref 12.0–15.0)
MCH: 27.4 pg (ref 26.0–34.0)
MCHC: 32.8 g/dL (ref 30.0–36.0)
MCV: 83.7 fL (ref 80.0–100.0)
Platelets: 286 10*3/uL (ref 150–400)
RBC: 4.23 MIL/uL (ref 3.87–5.11)
RDW: 14.2 % (ref 11.5–15.5)
WBC: 24.7 10*3/uL — ABNORMAL HIGH (ref 4.0–10.5)
nRBC: 0 % (ref 0.0–0.2)

## 2023-06-28 LAB — PHOSPHORUS: Phosphorus: 3.7 mg/dL (ref 2.5–4.6)

## 2023-06-28 LAB — LACTIC ACID, PLASMA
Lactic Acid, Venous: 1.1 mmol/L (ref 0.5–1.9)
Lactic Acid, Venous: 1.4 mmol/L (ref 0.5–1.9)

## 2023-06-28 LAB — MAGNESIUM: Magnesium: 2.1 mg/dL (ref 1.7–2.4)

## 2023-06-28 MED ORDER — GUAIFENESIN 100 MG/5ML PO LIQD
10.0000 mL | Freq: Four times a day (QID) | ORAL | Status: DC
  Administered 2023-06-28: 10 mL via ORAL
  Filled 2023-06-28: qty 10

## 2023-06-28 MED ORDER — HALOPERIDOL LACTATE 5 MG/ML IJ SOLN
2.0000 mg | Freq: Four times a day (QID) | INTRAMUSCULAR | Status: DC | PRN
  Administered 2023-06-28 – 2023-06-29 (×4): 2 mg via INTRAVENOUS
  Filled 2023-06-28 (×3): qty 1

## 2023-06-28 MED ORDER — MORPHINE SULFATE (PF) 2 MG/ML IV SOLN
2.0000 mg | Freq: Once | INTRAVENOUS | Status: AC
  Administered 2023-06-28: 2 mg via INTRAVENOUS
  Filled 2023-06-28: qty 1

## 2023-06-28 MED ORDER — METHYLPREDNISOLONE SODIUM SUCC 40 MG IJ SOLR
40.0000 mg | INTRAMUSCULAR | Status: DC
  Administered 2023-06-28 – 2023-06-29 (×2): 40 mg via INTRAVENOUS
  Filled 2023-06-28 (×2): qty 1

## 2023-06-28 MED ORDER — SODIUM CHLORIDE 0.9 % IV SOLN
500.0000 mg | INTRAVENOUS | Status: DC
  Administered 2023-06-28 – 2023-06-29 (×2): 500 mg via INTRAVENOUS
  Filled 2023-06-28 (×2): qty 5

## 2023-06-28 MED ORDER — MORPHINE SULFATE (PF) 2 MG/ML IV SOLN
2.0000 mg | INTRAVENOUS | Status: DC | PRN
  Administered 2023-06-28 – 2023-06-29 (×4): 2 mg via INTRAVENOUS
  Filled 2023-06-28 (×5): qty 1

## 2023-06-28 NOTE — Progress Notes (Signed)
Pt refusing to wear bipap any longer. Pt placed back on hfnc and tolerating well at this time. Cont to monitor.

## 2023-06-28 NOTE — Progress Notes (Signed)
SLP Cancellation Note  Patient Details Name: Casey [name] MRN: [id number] DOB: 04/07/1961   Cancelled treatment:       Reason Eval/Treat Not Completed: Medical issues which prohibited therapy. Pt requiring 50L/min HHFNC today which is not appropriate for any upgraded PO trials. Current recommendations of ice chips intermittently with nursing staff to mobilize secretions and meds crushed in purees remain appropriate. Will continue to follow for readiness.    Gwynneth Aliment, M.A., CF-SLP Speech Language Pathology, Acute Rehabilitation Services  Secure Chat preferred [phone]  06/28/2023, 11:50 AM

## 2023-06-28 NOTE — Progress Notes (Addendum)
eLink Physician-Brief Progress Note Patient Name: Casey [name] DOB: 1961-05-09 MRN: [id number]   Date of Service  06/28/2023  HPI/Events of Note  62 year old female that initially presented with acute respiratory failure with multifocal pneumonia thought to be secondary to aspiration in the setting of polysubstance abuse.  She has been bouncing back and forth between high flow nasal cannula and BiPAP but appears to be an extremis tonight.  She transition off BiPAP at 3 PM and has progressively gotten worse tonight.  eICU Interventions  Switched back to BiPAP, increase CPAP/BiPAP  If she is a failure to progress, will request intubation from ground team  2 PIV's in place, currently sedated on Precedex  Otherwise oxygenating/ventilating appropriately according to the gas at 2:30 PM.  Will obtain a gas after 30 minutes of BiPAP if tolerated   2048 -still wearing the BiPAP and laying on her side, appears tachypneic but somewhat less distressed.  Can continue to monitor for now  2135 - ABG reviewed, no changes currently indicated.  Intervention Category Major Interventions: Respiratory failure - evaluation and management  Katja Blue 06/28/2023, 8:16 PM

## 2023-06-28 NOTE — Progress Notes (Addendum)
NAME:  Casey [name], MRN:  [id number], DOB:  May 11, 1961, LOS: 2 ADMISSION DATE:  06/25/2023 CONSULTATION DATE:  06/26/2023 REFERRING MD:  Lyn Hollingshead - TRH ([facility]) CHIEF COMPLAINT:  AMS, SOB  History of Present Illness:  62 year old woman who presented to [facility] 8/17 as a transfer from [facility] ICU for AMS, hypoxia requiring BiPAP and possible aspiration PNA. PMHx significant for chronic pain, anxiety/depression, panic disorder, suicide attempt, polysubstance abuse.  Patient initially presented to [facility] ED 8/16 after being found at home unresponsive by family. EMS was called, administered Narcan 2mg  IN with improvement in mental status. Per chart review "possible took two Xanax that weren't Xanax". Patient initially refused transport to hospital but eventually agreed. Drowsy with garbled speech with EMS but able to follow commands. Hypoxic to 86% on RA, initially placed on [place] but escalated to NRB with improvement in SpO2 to 96%. In ED, patient was hypothermic, tachycardic to 100s, BP 139/95, RR 38, SpO2 89% on NRB. Labs were notable for WBC 10.6, Hgb 13.2, Plt 414. Na 136, K 3.5, CO2 22, Cr 0.76, LFTs WNL. UA unremarkable. Ethanol and APAP levels negative. UDS+ cocaine, BZD and THC. CXR demonstrated asymmetric bibasilar interstitial and airspace infiltrates c/f multifocal PNA/aspiration. Unasyn ordered in ED but changed to ceftriaxone due to ?allergy. Patient was subsequently admitted to [facility].  On 8/17, patient was noted to have persistent respiratory distress with escalating O2 requirements to HFNC and eventually BiPAP. CXR appeared worse with rapid progression of diffuse bilateral heterogeneous consolidative airspace disease in LUL and R lung, c/w worsened multifocal PNA and small layering R pleural effusion.   PCCM consulted for transfer to [facility] for further care.  Pertinent Medical History:   Past Medical History:  Diagnosis Date   Anxiety    Chronic back pain    Depression    History of panic attacks  09/11/2014   Medical history non-contributory    Polysubstance abuse (HCC)    History of taking non-prescribed opiates, BZDs; also +cocaine and THC   Ruptured disc, thoracic    Suicide attempt (HCC) 2002   Significant Hospital Events: Including procedures, antibiotic start and stop dates in addition to other pertinent events   8/16 - Presented to [facility] via EMS for AMS, hypoxia. Presumed drug intoxication. UDS +cocaine, BZDs, THC. CXR with multifocal PNA. Required [place] then NRB then HFNC. Empiric ceftriaxone. 8/17 - Worsening respiratory status requiring escalation to BiPAP. ABG poor. CXR worse. Transferred to [facility] for further care. 8/18: Opt-flow  Interim History / Subjective:  Increasing FiO2 overnight, weaning this morning Bradycardia with Precedex infusion at 0.8 mcg/kg/min, improves when patient more awake and interactive. Hemodynamics stable. Labs pending this am  Objective:  Blood pressure 136/72, pulse (!) 59, temperature 97.7 F (36.5 C), temperature source Axillary, resp. rate (!) 32, height 5\' 5"  (1.651 m), weight 67 kg, SpO2 99%.    FiO2 (%):  [55 %-95 %] 80 %   Intake/Output Summary (Last 24 hours) at 06/28/2023 0810 Last data filed at 06/28/2023 0600 Gross per 24 hour  Intake 719.94 ml  Output 1550 ml  Net -830.06 ml   Filed Weights   06/25/23 2042 06/26/23 0353 06/27/23 0500  Weight: 61 kg 67.9 kg 67 kg   Physical Examination: General: Acute on chronic ill-appearing middle-aged female lying in bed on HHFNC HEENT: [place]/AT, MM pink/moist, PERRL,  Neuro: Oriented x 3, nonfocal CV: s1s2 bradycardic, no murmur, rubs, or gallops,  PULM: No wheezing, diminished at bases, crackles at right base, minimal increase  work of breathing when talking GI: soft, bowel sounds active in all 4 quadrants, non-tender, non-distended Extremities: warm/dry, left hand edema only  Skin: no rashes or lesions  Resolved Hospital Problem List:    Assessment & Plan:  Acute hypoxemic respiratory  failure in the setting of multifocal PNA P: Currently on Opti-flow with increased settings overnight.  Supplemental oxygen for sat goal greater than 90, wean today Continue IV Solu-Medrol, decreased to 40 mg daily  Bronchodilators Pulmonary hygiene with CPT, frequent I-S and flutter valve  Multifocal PNA, presumed aspiration Leukocytosis in the setting of above Small R pleural effusion P: Strep urine negative Guaifenesin Dry productive cough, no sputum for respiratory culture Continue cefepime and vancomycin Add Azithro for atypical coverage  RPP pending Urine legionella pending   Chronic pain -PDMP reviewed, patient does not have any controlled substance prescriptions prescribed to her at this time. P: Monitor for signs of withdrawal Maximize nonopioid adjuncts  Polysubstance abuse -UDS +cocaine, BZDs, THC. History of taking non-prescribed opiates/BZDs per family. She reports daily use of Xanax that she "buys off the street", cannot tell me the dose or amount of benzodiazepine use. Denies cocaine use P: Cessation education when appropriate Monitor for withdrawal as above TOC consult when appropriate  Bradycardia - Hemodynamics stable - EKG pending - No chest pain - Likely due to precedex, wean precedex off as able  GAD with panic disorder Depression P: On Precedex, wean as tolerated Continue home Celexa  Home atarax held  Best Practice: (right click and "Reselect all SmartList Selections" daily)   Diet/type: NPO with meds.  SLP to eval today DVT prophylaxis: SCDs, SQH GI prophylaxis: PPI Lines: N/A Foley:  N/A Code Status:  full code Last date of multidisciplinary goals of care discussion: with  patient.   Critical care time: 45  CRITICAL CARE Performed by: Cindra Eves  Critical care time was exclusive of separately billable procedures and treating other patients.  Critical care was necessary to treat or prevent imminent or life-threatening  deterioration.  Critical care was time spent personally by me on the following activities: development of treatment plan with patient and/or surrogate as well as nursing, discussions with consultants, evaluation of patient's response to treatment, examination of patient, obtaining history from patient or surrogate, ordering and performing treatments and interventions, ordering and review of laboratory studies, ordering and review of radiographic studies, pulse oximetry and re-evaluation of patient's condition.  Earney Hamburg [facility] Personal contact information can be found on Amion  If no contact or response made please call [phone] 06/28/2023, 8:10 AM

## 2023-06-29 ENCOUNTER — Inpatient Hospital Stay (HOSPITAL_COMMUNITY): Payer: MEDICAID

## 2023-06-29 DIAGNOSIS — J9601 Acute respiratory failure with hypoxia: Secondary | ICD-10-CM | POA: Diagnosis not present

## 2023-06-29 DIAGNOSIS — E440 Moderate protein-calorie malnutrition: Secondary | ICD-10-CM | POA: Insufficient documentation

## 2023-06-29 DIAGNOSIS — E43 Unspecified severe protein-calorie malnutrition: Secondary | ICD-10-CM | POA: Insufficient documentation

## 2023-06-29 LAB — CBC
HCT: 37.8 % (ref 36.0–46.0)
Hemoglobin: 12.1 g/dL (ref 12.0–15.0)
MCH: 27.8 pg (ref 26.0–34.0)
MCHC: 32 g/dL (ref 30.0–36.0)
MCV: 86.7 fL (ref 80.0–100.0)
Platelets: 303 10*3/uL (ref 150–400)
RBC: 4.36 MIL/uL (ref 3.87–5.11)
RDW: 14.5 % (ref 11.5–15.5)
WBC: 30.8 10*3/uL — ABNORMAL HIGH (ref 4.0–10.5)
nRBC: 0 % (ref 0.0–0.2)

## 2023-06-29 LAB — POCT I-STAT 7, (LYTES, BLD GAS, ICA,H+H)
Acid-base deficit: 10 mmol/L — ABNORMAL HIGH (ref 0.0–2.0)
Acid-base deficit: 4 mmol/L — ABNORMAL HIGH (ref 0.0–2.0)
Bicarbonate: 20.4 mmol/L (ref 20.0–28.0)
Bicarbonate: 21.7 mmol/L (ref 20.0–28.0)
Calcium, Ion: 1.25 mmol/L (ref 1.15–1.40)
Calcium, Ion: 1.24 mmol/L (ref 1.15–1.40)
HCT: 34 % — ABNORMAL LOW (ref 36.0–46.0)
HCT: 33 % — ABNORMAL LOW (ref 36.0–46.0)
Hemoglobin: 11.6 g/dL — ABNORMAL LOW (ref 12.0–15.0)
Hemoglobin: 11.2 g/dL — ABNORMAL LOW (ref 12.0–15.0)
O2 Saturation: 100 %
O2 Saturation: 98 %
Patient temperature: 37
Potassium: 4.3 mmol/L (ref 3.5–5.1)
Potassium: 4.5 mmol/L (ref 3.5–5.1)
Sodium: 146 mmol/L — ABNORMAL HIGH (ref 135–145)
Sodium: 145 mmol/L (ref 135–145)
TCO2: 22 mmol/L (ref 22–32)
TCO2: 23 mmol/L (ref 22–32)
pCO2 arterial: 64.6 mmHg — ABNORMAL HIGH (ref 32–48)
pCO2 arterial: 42.2 mmHg (ref 32–48)
pH, Arterial: 7.107 — CL (ref 7.35–7.45)
pH, Arterial: 7.319 — ABNORMAL LOW (ref 7.35–7.45)
pO2, Arterial: 250 mmHg — ABNORMAL HIGH (ref 83–108)
pO2, Arterial: 123 mmHg — ABNORMAL HIGH (ref 83–108)

## 2023-06-29 LAB — GLUCOSE, CAPILLARY
Glucose-Capillary: 120 mg/dL — ABNORMAL HIGH (ref 70–99)
Glucose-Capillary: 145 mg/dL — ABNORMAL HIGH (ref 70–99)
Glucose-Capillary: 122 mg/dL — ABNORMAL HIGH (ref 70–99)
Glucose-Capillary: 138 mg/dL — ABNORMAL HIGH (ref 70–99)

## 2023-06-29 LAB — LACTIC ACID, PLASMA: Lactic Acid, Venous: 1.3 mmol/L (ref 0.5–1.9)

## 2023-06-29 LAB — BASIC METABOLIC PANEL
Anion gap: 13 (ref 5–15)
BUN: 26 mg/dL — ABNORMAL HIGH (ref 8–23)
CO2: 17 mmol/L — ABNORMAL LOW (ref 22–32)
Calcium: 8.4 mg/dL — ABNORMAL LOW (ref 8.9–10.3)
Chloride: 109 mmol/L (ref 98–111)
Creatinine, Ser: 0.61 mg/dL (ref 0.44–1.00)
GFR, Estimated: >60 mL/min (ref >60)
Glucose, Bld: 128 mg/dL — ABNORMAL HIGH (ref 70–99)
Potassium: 5.2 mmol/L — ABNORMAL HIGH (ref 3.5–5.1)
Sodium: 139 mmol/L (ref 135–145)

## 2023-06-29 LAB — LEGIONELLA PNEUMOPHILA SEROGP 1 UR AG: L. pneumophila Serogp 1 Ur Ag: NEGATIVE

## 2023-06-29 LAB — MAGNESIUM: Magnesium: 1.9 mg/dL (ref 1.7–2.4)

## 2023-06-29 LAB — PHOSPHORUS: Phosphorus: 3.5 mg/dL (ref 2.5–4.6)

## 2023-06-29 MED ORDER — FENTANYL 2500MCG IN NS 250ML (10MCG/ML) PREMIX INFUSION
50.0000 ug/h | INTRAVENOUS | Status: DC
  Administered 2023-06-29: 50 ug/h via INTRAVENOUS
  Filled 2023-06-29: qty 250

## 2023-06-29 MED ORDER — PHENYLEPHRINE 80 MCG/ML (10ML) SYRINGE FOR IV PUSH (FOR BLOOD PRESSURE SUPPORT)
80.0000 ug | PREFILLED_SYRINGE | Freq: Once | INTRAVENOUS | Status: AC | PRN
  Administered 2023-06-29: 560 ug via INTRAVENOUS

## 2023-06-29 MED ORDER — FENTANYL 2500MCG IN NS 250ML (10MCG/ML) PREMIX INFUSION
50.0000 ug/h | INTRAVENOUS | Status: DC
  Administered 2023-06-29 – 2023-07-01 (×5): 300 ug/h via INTRAVENOUS
  Administered 2023-07-01: 150 ug/h via INTRAVENOUS
  Administered 2023-07-02 – 2023-07-03 (×2): 100 ug/h via INTRAVENOUS
  Filled 2023-06-29 (×8): qty 250

## 2023-06-29 MED ORDER — ORAL CARE MOUTH RINSE
15.0000 mL | OROMUCOSAL | Status: DC
  Administered 2023-06-29 – 2023-06-30 (×9): 15 mL via OROMUCOSAL

## 2023-06-29 MED ORDER — HYDROXYZINE HCL 25 MG PO TABS
50.0000 mg | ORAL_TABLET | Freq: Three times a day (TID) | ORAL | Status: DC | PRN
  Administered 2023-07-03 – 2023-07-08 (×6): 50 mg
  Filled 2023-06-29 (×6): qty 2

## 2023-06-29 MED ORDER — ORAL CARE MOUTH RINSE
15.0000 mL | OROMUCOSAL | Status: DC
  Administered 2023-06-29 (×2): 15 mL via OROMUCOSAL

## 2023-06-29 MED ORDER — THIAMINE MONONITRATE 100 MG PO TABS
100.0000 mg | ORAL_TABLET | Freq: Every day | ORAL | Status: AC
  Administered 2023-06-29 – 2023-07-03 (×5): 100 mg
  Filled 2023-06-29 (×5): qty 1

## 2023-06-29 MED ORDER — OSMOLITE 1.5 CAL PO LIQD
1000.0000 mL | ORAL | Status: DC
  Administered 2023-06-29 – 2023-07-04 (×7): 1000 mL
  Filled 2023-06-29: qty 1000

## 2023-06-29 MED ORDER — ORAL CARE MOUTH RINSE
15.0000 mL | OROMUCOSAL | Status: DC
  Administered 2023-06-29: 15 mL via OROMUCOSAL

## 2023-06-29 MED ORDER — ORAL CARE MOUTH RINSE: 15.0000 mL | OROMUCOSAL | Status: DC | PRN

## 2023-06-29 MED ORDER — PROSOURCE TF20 ENFIT COMPATIBL EN LIQD
60.0000 mL | Freq: Every day | ENTERAL | Status: DC
  Administered 2023-06-29 – 2023-07-10 (×12): 60 mL
  Filled 2023-06-29 (×13): qty 60

## 2023-06-29 MED ORDER — DOCUSATE SODIUM 50 MG/5ML PO LIQD
100.0000 mg | Freq: Two times a day (BID) | ORAL | Status: DC
  Administered 2023-06-29 – 2023-07-01 (×6): 100 mg
  Filled 2023-06-29 (×6): qty 10

## 2023-06-29 MED ORDER — MIDAZOLAM HCL 2 MG/2ML IJ SOLN
2.0000 mg | Freq: Once | INTRAMUSCULAR | Status: AC
  Administered 2023-06-29: 2 mg via INTRAVENOUS
  Filled 2023-06-29: qty 2

## 2023-06-29 MED ORDER — ONDANSETRON HCL 4 MG PO TABS: 4.0000 mg | ORAL_TABLET | Freq: Four times a day (QID) | ORAL | Status: DC | PRN

## 2023-06-29 MED ORDER — FENTANYL BOLUS VIA INFUSION
50.0000 ug | INTRAVENOUS | Status: DC | PRN
  Administered 2023-06-29 – 2023-07-01 (×19): 100 ug via INTRAVENOUS
  Administered 2023-07-01: 50 ug via INTRAVENOUS
  Administered 2023-07-02 – 2023-07-03 (×2): 100 ug via INTRAVENOUS

## 2023-06-29 MED ORDER — ACETAMINOPHEN 650 MG RE SUPP: 650.0000 mg | Freq: Four times a day (QID) | RECTAL | Status: DC | PRN

## 2023-06-29 MED ORDER — FENTANYL CITRATE PF 50 MCG/ML IJ SOSY: 50.0000 ug | PREFILLED_SYRINGE | Freq: Once | INTRAMUSCULAR | Status: DC

## 2023-06-29 MED ORDER — ADULT MULTIVITAMIN W/MINERALS CH
1.0000 | ORAL_TABLET | Freq: Every day | ORAL | Status: DC
  Administered 2023-06-29 – 2023-07-08 (×10): 1
  Filled 2023-06-29 (×10): qty 1

## 2023-06-29 MED ORDER — SODIUM ZIRCONIUM CYCLOSILICATE 10 G PO PACK
10.0000 g | PACK | Freq: Once | ORAL | Status: AC
  Administered 2023-06-29: 10 g
  Filled 2023-06-29: qty 1

## 2023-06-29 MED ORDER — NOREPINEPHRINE 4 MG/250ML-% IV SOLN
2.0000 ug/min | INTRAVENOUS | Status: DC
  Administered 2023-06-29: 2 ug/min via INTRAVENOUS
  Administered 2023-06-30: 4 ug/min via INTRAVENOUS
  Administered 2023-06-30 – 2023-07-02 (×2): 2 ug/min via INTRAVENOUS
  Filled 2023-06-29 (×3): qty 250

## 2023-06-29 MED ORDER — KETAMINE HCL 50 MG/5ML IJ SOSY
1.0000 mg/kg | PREFILLED_SYRINGE | Freq: Once | INTRAMUSCULAR | Status: DC
  Filled 2023-06-29: qty 10

## 2023-06-29 MED ORDER — SODIUM CHLORIDE 0.9 % IV BOLUS
1000.0000 mL | Freq: Once | INTRAVENOUS | Status: AC
  Administered 2023-06-29: 1000 mL via INTRAVENOUS

## 2023-06-29 MED ORDER — PROSOURCE TF20 ENFIT COMPATIBL EN LIQD: 60.0000 mL | Freq: Every day | ENTERAL | Status: DC

## 2023-06-29 MED ORDER — SODIUM CHLORIDE 0.9 % IV SOLN
2.0000 g | INTRAVENOUS | Status: DC
  Administered 2023-06-29 – 2023-06-30 (×2): 2 g via INTRAVENOUS
  Filled 2023-06-29 (×2): qty 20

## 2023-06-29 MED ORDER — ONDANSETRON HCL 4 MG/2ML IJ SOLN
4.0000 mg | Freq: Four times a day (QID) | INTRAMUSCULAR | Status: DC | PRN
  Administered 2023-07-05 – 2023-07-08 (×3): 4 mg via INTRAVENOUS
  Filled 2023-06-29 (×3): qty 2

## 2023-06-29 MED ORDER — ROCURONIUM BROMIDE 50 MG/5ML IV SOLN
100.0000 mg | Freq: Once | INTRAVENOUS | Status: AC
  Administered 2023-06-29: 100 mg via INTRAVENOUS
  Filled 2023-06-29: qty 10

## 2023-06-29 MED ORDER — CITALOPRAM HYDROBROMIDE 20 MG PO TABS
20.0000 mg | ORAL_TABLET | Freq: Every day | ORAL | Status: DC
  Administered 2023-06-30 – 2023-07-08 (×9): 20 mg
  Filled 2023-06-29 (×9): qty 1

## 2023-06-29 MED ORDER — ROCURONIUM BROMIDE 50 MG/5ML IV SOLN: 100.0000 mg | Freq: Once | INTRAVENOUS | Status: DC

## 2023-06-29 MED ORDER — GUAIFENESIN 100 MG/5ML PO LIQD
10.0000 mL | Freq: Four times a day (QID) | ORAL | Status: DC
  Administered 2023-06-29 – 2023-07-03 (×15): 10 mL
  Filled 2023-06-29 (×16): qty 15

## 2023-06-29 MED ORDER — VITAL HIGH PROTEIN PO LIQD: 1000.0000 mL | ORAL | Status: DC

## 2023-06-29 MED ORDER — ACETAMINOPHEN 325 MG PO TABS
650.0000 mg | ORAL_TABLET | Freq: Four times a day (QID) | ORAL | Status: DC | PRN
  Administered 2023-07-04 – 2023-07-08 (×6): 650 mg
  Filled 2023-06-29 (×6): qty 2

## 2023-06-29 MED ORDER — POLYETHYLENE GLYCOL 3350 17 G PO PACK
17.0000 g | PACK | Freq: Every day | ORAL | Status: DC
  Administered 2023-06-30 – 2023-07-01 (×2): 17 g
  Filled 2023-06-29 (×2): qty 1

## 2023-06-29 MED ORDER — SODIUM CHLORIDE 0.9 % IV SOLN
250.0000 mL | INTRAVENOUS | Status: DC
  Administered 2023-06-29: 10 mL/h via INTRAVENOUS
  Administered 2023-06-30 – 2023-07-01 (×2): 250 mL via INTRAVENOUS

## 2023-06-29 MED ORDER — PHENYLEPHRINE 80 MCG/ML (10ML) SYRINGE FOR IV PUSH (FOR BLOOD PRESSURE SUPPORT): 80.0000 ug | PREFILLED_SYRINGE | Freq: Once | INTRAVENOUS | Status: DC | PRN

## 2023-06-29 MED ORDER — MIDAZOLAM HCL 2 MG/2ML IJ SOLN
2.0000 mg | INTRAMUSCULAR | Status: DC | PRN
  Administered 2023-06-30 – 2023-07-04 (×7): 2 mg via INTRAVENOUS
  Filled 2023-06-29 (×7): qty 2

## 2023-06-29 MED ORDER — KETAMINE HCL 50 MG/5ML IJ SOSY
50.0000 mg | PREFILLED_SYRINGE | Freq: Once | INTRAMUSCULAR | Status: AC
  Administered 2023-06-29: 50 mg via INTRAVENOUS

## 2023-06-29 MED ORDER — FENTANYL CITRATE PF 50 MCG/ML IJ SOSY
100.0000 ug | PREFILLED_SYRINGE | Freq: Once | INTRAMUSCULAR | Status: AC
  Administered 2023-06-29: 100 ug via INTRAVENOUS
  Filled 2023-06-29: qty 2

## 2023-06-29 NOTE — Progress Notes (Signed)
Changes post abg results

## 2023-06-29 NOTE — Procedures (Signed)
Intubation Procedure Note  Casey [name]  [id number]  [birth year]  Date:06/29/23  RKYH:[id number]  Provider Performing:Jillana Selph M Mariea Stable    Procedure: Intubation (31500)  Indication(s) Respiratory Failure  Consent Unable to obtain consent due to emergent nature of procedure.   Anesthesia    Time Out Verified patient identification, verified procedure, site/side was marked, verified correct patient position, special equipment/implants available, medications/allergies/relevant history reviewed, required imaging and test results available.   Sterile Technique Usual hand hygeine, masks, and gloves were used   Procedure Description Patient positioned in bed supine.  Sedation given as noted above.  Patient was intubated with endotracheal tube using Glidescope.  View was Grade 1 full glottis .  Number of attempts was 1.  Colorimetric CO2 detector was consistent with tracheal placement.   Complications/Tolerance None; patient tolerated the procedure well. Chest X-ray is ordered to verify placement.   EBL Minimal   Specimen(s) None  Pt orally intubated on first attempt by ICU resident. 7.5 oral ETT at 21 lips. +BBS; +ETCO2 CXR and ABG pending. Pt placed on vent at documented settings.

## 2023-06-29 NOTE — Progress Notes (Addendum)
Intubation Procedure Note  Casey [name]  [id number]  1961-05-15  Date:06/29/23  Time:12:11 PM   Provider Performing:Atif Mahmood , supervised by Dr. Denese Killings   Procedure: Intubation (31500)  Indication(s) Respiratory Failure  Consent Unable to obtain consent due to emergent nature of procedure.   Anesthesia Versed, Fentanyl, Rocuronium, and ketamine   Time Out Verified patient identification, verified procedure, site/side was marked, verified correct patient position, special equipment/implants available, medications/allergies/relevant history reviewed, required imaging and test results available.   Sterile Technique Usual hand hygeine, masks, and gloves were used   Procedure Description Patient positioned in bed supine.  Sedation given as noted above.  Patient was intubated with endotracheal tube using Glidescope.  View was Grade 1 full glottis .  Number of attempts was 1.  Colorimetric CO2 detector was consistent with tracheal placement.   Complications/Tolerance None; patient tolerated the procedure well. Chest X-ray is ordered to verify placement.   EBL Minimal   Specimen(s) None  I was present and supervised the procedure in its entirety.  Lynnell Catalan, MD [facility] ICU Physician [facility] [place] [facility]  Pager: [phone] Or Epic Secure Chat After hours: [phone].  06/29/2023, 1:49 PM

## 2023-06-29 NOTE — Progress Notes (Addendum)
NAME:  Casey [name], MRN:  [id number], DOB:  May 11, 1961, LOS: 3 ADMISSION DATE:  06/25/2023 CONSULTATION DATE:  06/26/2023 REFERRING MD:  Lyn Hollingshead - TRH ([facility]) CHIEF COMPLAINT:  AMS, SOB  History of Present Illness:  62 year old woman who presented to [facility] 8/17 as a transfer from [facility] ICU for AMS, hypoxia requiring BiPAP and possible aspiration PNA. PMHx significant for chronic pain, anxiety/depression, panic disorder, suicide attempt, polysubstance abuse.  Patient initially presented to [facility] ED 8/16 after being found at home unresponsive by family. EMS was called, administered Narcan 2mg  IN with improvement in mental status. Per chart review "possible took two Xanax that weren't Xanax". Patient initially refused transport to hospital but eventually agreed. Drowsy with garbled speech with EMS but able to follow commands. Hypoxic to 86% on RA, initially placed on Brodnax but escalated to NRB with improvement in SpO2 to 96%. In ED, patient was hypothermic, tachycardic to 100s, BP 139/95, RR 38, SpO2 89% on NRB. Labs were notable for WBC 10.6, Hgb 13.2, Plt 414. Na 136, K 3.5, CO2 22, Cr 0.76, LFTs WNL. UA unremarkable. Ethanol and APAP levels negative. UDS+ cocaine, BZD and THC. CXR demonstrated asymmetric bibasilar interstitial and airspace infiltrates c/f multifocal PNA/aspiration. Unasyn ordered in ED but changed to ceftriaxone due to ?allergy. Patient was subsequently admitted to [facility].  On 8/17, patient was noted to have persistent respiratory distress with escalating O2 requirements to HFNC and eventually BiPAP. CXR appeared worse with rapid progression of diffuse bilateral heterogeneous consolidative airspace disease in LUL and R lung, c/w worsened multifocal PNA and small layering R pleural effusion.   PCCM consulted for transfer to [facility] for further care.  Pertinent Medical History:   Past Medical History:  Diagnosis Date   Anxiety    Chronic back pain    Depression    History of panic attacks  09/11/2014   Medical history non-contributory    Polysubstance abuse (HCC)    History of taking non-prescribed opiates, BZDs; also +cocaine and THC   Ruptured disc, thoracic    Suicide attempt (HCC) 2002   Significant Hospital Events: Including procedures, antibiotic start and stop dates in addition to other pertinent events   8/16 - Presented to [facility] via EMS for AMS, hypoxia. Presumed drug intoxication. UDS +cocaine, BZDs, THC. CXR with multifocal PNA. Required [place] then NRB then HFNC. Empiric ceftriaxone. 8/17 - Worsening respiratory status requiring escalation to BiPAP. ABG poor. CXR worse. Transferred to [facility] for further care. 8/18: Opt-flow 8/20: placed on BiPAP overnight. Continued labored breathing in AM, subsequently intubated. Switched abx to ceftriaxone and vancomycin   Interim History / Subjective:  Placed on BiPAP overnight. Had chest pain overnight, EKG without acute ischemic changes, received ativan and morphine.   Objective:  Blood pressure (!) 150/81, pulse (!) 119, temperature 98.9 F (37.2 C), temperature source Oral, resp. rate 20, height 5\' 5"  (1.651 m), weight 64.4 kg, SpO2 91%.    Vent Mode: PRVC FiO2 (%):  [50 %-100 %] 50 % Set Rate:  [20 bmp] 20 bmp Vt Set:  [450 mL] 450 mL PEEP:  [5 cmH20] 5 cmH20 Plateau Pressure:  [22 cmH20] 22 cmH20   Intake/Output Summary (Last 24 hours) at 06/29/2023 1149 Last data filed at 06/29/2023 1100 Gross per 24 hour  Intake 1442.39 ml  Output 2150 ml  Net -707.61 ml   Filed Weights   06/26/23 0353 06/27/23 0500 06/29/23 0500  Weight: 67.9 kg 67 kg 64.4 kg   Physical Examination: General: Acute on  chronic ill-appearing middle-aged female lying in bed on BiPAP, appears uncomfortable HEENT: [place]/AT Neuro: nonfocal CV: tachycardic, regular rhythm, no mrg PULM: increased WOB on BiPAP, tachypneic with labored breaths GI: soft, non-tender, non-distended Extremities: warm/dry, no significant lower extremity edema Skin: no rashes or  lesions  Resolved Hospital Problem List:    Assessment & Plan:  Acute hypoxemic respiratory failure in the setting of multifocal PNA P: -Currently not tolerating BiPAP with apparent increased WOB -Proceed with intubation and mechanical ventilation Ventilator support with lung protective strategies  Wean PEEP and FiO2 for sats greater than 90%. Head of bed elevated 30 degrees. Plateau pressures less than 30 cm H20.  DC solumedrol - given this is likely aspiration PNA Bronchodilators Pulmonary hygiene with CPT, frequent I-S and flutter valve  Multifocal PNA, presumed aspiration Leukocytosis in the setting of above (30.8 today from 24.7) Small R pleural effusion P: Strep urine negative Guaifenesin Dry productive cough, no sputum for respiratory culture -Switch to ceftriaxone and vancomycin - dc cefepime and azithro. F/u bcx at 48h RPP negative Urine legionella pending   Chronic pain -PDMP reviewed, patient does not have any controlled substance prescriptions prescribed to her at this time. P: Monitor for signs of withdrawal Maximize nonopioid adjuncts  Polysubstance abuse -UDS +cocaine, BZDs, THC. History of taking non-prescribed opiates/BZDs per family. She reports daily use of Xanax that she "buys off the street", cannot tell me the dose or amount of benzodiazepine use. Denies cocaine use P: Cessation education when appropriate Monitor for withdrawal as above TOC consult when appropriate  GAD with panic disorder Depression P: On Precedex, wean as tolerated Continue home Celexa  PRN atarax  Best Practice: (right click and "Reselect all SmartList Selections" daily)   Diet/type: tubefeeds per RD  DVT prophylaxis: SCDs, SQH GI prophylaxis: PPI Lines: N/A Foley:  N/A Code Status:  full code Last date of multidisciplinary goals of care discussion: with  patient.     Vonna Drafts, MD 06/29/2023, 11:49 AM

## 2023-06-29 NOTE — Progress Notes (Signed)
eLink Physician-Brief Progress Note Patient Name: Casey [name] DOB: 1961/10/09 MRN: [id number]   Date of Service  06/29/2023  HPI/Events of Note  Patient with sub-optimal sedation on the ventilator.  eICU Interventions  Fentanyl gtt ceiling increased to 300 mcg and PRN Versed added.        [name] 06/29/2023, 9:15 PM

## 2023-06-29 NOTE — Progress Notes (Signed)
Initial Nutrition Assessment  DOCUMENTATION CODES:   Non-severe (moderate) malnutrition in context of social or environmental circumstances  INTERVENTION:   Initiate enteral nutrition via OG tube: - Start Osmolite 1.5 @ 20 ml/hr and advance rate by 10 ml every 8 hours to goal rate of 50 ml/hr (1200 ml/day) - PROSource TF20 60 ml daily  Tube feeding regimen at goal rate provides 1880 kcal, 95 grams of protein, and 914 ml of H2O.  - Consider exchanging OG tube for Cortrak  Monitor magnesium, potassium, and phosphorus every 12 hours for at least 6 occurrences. MD to replete as needed as pt is at risk for refeeding syndrome given malnutrition.  - MVI with minerals daily per tube  - Thiamine 100 mg daily per tube x 5 days due to refeeding risk  NUTRITION DIAGNOSIS:   Moderate Malnutrition related to social / environmental circumstances (polysubstance abuse) as evidenced by mild fat depletion, mild muscle depletion.  GOAL:   Patient will meet greater than or equal to 90% of their needs  MONITOR:   Vent status, Labs, Weight trends, TF tolerance  REASON FOR ASSESSMENT:   Ventilator, Consult Enteral/tube feeding initiation and management  ASSESSMENT:   62 year old female who presented to the ED on 8/16 after being found unresponsive by family. PMH of anxiety, chronic pain, depression, suicide attempt, polysubstance abuse. Pt admitted with acute hypoxemic respiratory failure in the setting of aspiration pneumonia, acute metabolic encephalopathy.  8/17 - transfer to [facility], Heart healthy diet, later NPO 8/18 - SLP recommending NPO 8/19 - Heart Healthy diet, later NPO 8/20 - intubated  Discussed pt with RN and during ICU rounds. Pt intubated this morning. Consult received for enteral nutrition initiation and management. OG tube in stomach per x-ray.  Unable to obtain diet and weight history at this time. Pt with one meal completion charted of 50% for breakfast meal on 8/17. Pt has  been NPO since 2048 on 8/17 except for very brief period (< 10 minutes) on Heart Healthy diet on 8/19. Pt with inadequate nutrition since admission.  Reviewed weight history in chart. Weight is up compared to weights from earlier this year and is also up compared to admission weight. Suspect dry weight is closer to admission weight of 61 kg.  Based on NFPE, pt meets criteria for moderate malnutrition.  Admit weight: 61 kg Current weight: 64.4 kg  Patient is currently intubated on ventilator support MV: 9.2 L/min Temp (24hrs), Avg:98 F (36.7 C), Min:97.1 F (36.2 C), Max:98.9 F (37.2 C)  Drips: Precedex Fentanyl Levophed: off this AM  Medications reviewed and include: colace, IV protonix, miralax, IV abx  Labs reviewed: sodium 146, BUN 26, WBC 30.8 CBG's: 81-133 x 48 hours  UOP: 1750 ml x 24 hours I/O's: -1.3 L since admit  NUTRITION - FOCUSED PHYSICAL EXAM:  Flowsheet Row Most Recent Value  Orbital Region Mild depletion  Upper Arm Region Mild depletion  Thoracic and Lumbar Region Mild depletion  Buccal Region Unable to assess  Temple Region Mild depletion  Clavicle Bone Region Mild depletion  Clavicle and Acromion Bone Region Moderate depletion  Scapular Bone Region Unable to assess  Dorsal Hand Mild depletion  Patellar Region No depletion  Anterior Thigh Region Mild depletion  Posterior Calf Region Mild depletion  Edema (RD Assessment) Mild  Hair Reviewed  Eyes Reviewed  Mouth Reviewed  Skin Reviewed  Nails Reviewed    Diet Order:   Diet Order  Diet NPO time specified Except for: Sips with Meds  Diet effective now                   EDUCATION NEEDS:   Not appropriate for education at this time  Skin:  Skin Assessment: Reviewed RN Assessment  Last BM:  06/25/23  Height:   Ht Readings from Last 1 Encounters:  06/29/23 5\' 5"  (1.651 m)    Weight:   Wt Readings from Last 1 Encounters:  06/29/23 64.4 kg    Ideal Body Weight:   56.8 kg  BMI:  Body mass index is 23.61 kg/m.  Estimated Nutritional Needs:   Kcal:  1700-1900  Protein:  85-100 grams  Fluid:  1.7-1.9 L    Mertie Clause, MS, RD, LDN Registered Dietitian II Please see AMiON for contact information.

## 2023-06-29 NOTE — Plan of Care (Signed)
Patient  continues to have RR 35-40 after intubation this am. Requiring continuous sedation to control severe agitation

## 2023-06-29 NOTE — TOC CM/SW Note (Addendum)
Transition of Care [facility]) - Inpatient Brief Assessment   Patient Details  Name: Casey [name] MRN: [id number] Date of Birth: [birth year]  Transition of Care [facility]) CM/SW Contact:    Tom-Johnson, Hershal Coria, RN Phone Number: 06/29/2023, 3:47 PM   Clinical Narrative:  Patient not tolerating BiPAP, intubated today d/t increased  Work Of Breathing.  Continues on IV abx.   Patient not Medically ready for discharge.  CM will continue to follow as patient progresses with care towards discharge.        Transition of Care Asessment: Insurance and Status: Insurance coverage has been reviewed Patient has primary care physician: Yes Home environment has been reviewed: From Home Prior level of function:: Independent Prior/Current Home Services: No current home services Social Determinants of Health Reivew: SDOH reviewed no interventions necessary Readmission risk has been reviewed: Yes (No score) Transition of care needs: no transition of care needs at this time

## 2023-06-29 NOTE — Progress Notes (Signed)
Unable to obtain sputum sample at this time. Pt w/o secretions at this time.

## 2023-06-30 ENCOUNTER — Inpatient Hospital Stay (HOSPITAL_COMMUNITY): Payer: MEDICAID

## 2023-06-30 ENCOUNTER — Other Ambulatory Visit: Payer: Self-pay

## 2023-06-30 DIAGNOSIS — J9601 Acute respiratory failure with hypoxia: Secondary | ICD-10-CM | POA: Diagnosis not present

## 2023-06-30 LAB — BASIC METABOLIC PANEL
Anion gap: 8 (ref 5–15)
BUN: 25 mg/dL — ABNORMAL HIGH (ref 8–23)
CO2: 23 mmol/L (ref 22–32)
Calcium: 8.2 mg/dL — ABNORMAL LOW (ref 8.9–10.3)
Chloride: 113 mmol/L — ABNORMAL HIGH (ref 98–111)
Creatinine, Ser: 0.97 mg/dL (ref 0.44–1.00)
GFR, Estimated: >60 mL/min (ref >60)
Glucose, Bld: 216 mg/dL — ABNORMAL HIGH (ref 70–99)
Potassium: 3.8 mmol/L (ref 3.5–5.1)
Sodium: 144 mmol/L (ref 135–145)

## 2023-06-30 LAB — GLUCOSE, CAPILLARY
Glucose-Capillary: 143 mg/dL — ABNORMAL HIGH (ref 70–99)
Glucose-Capillary: 117 mg/dL — ABNORMAL HIGH (ref 70–99)
Glucose-Capillary: 149 mg/dL — ABNORMAL HIGH (ref 70–99)
Glucose-Capillary: 138 mg/dL — ABNORMAL HIGH (ref 70–99)
Glucose-Capillary: 124 mg/dL — ABNORMAL HIGH (ref 70–99)
Glucose-Capillary: 129 mg/dL — ABNORMAL HIGH (ref 70–99)

## 2023-06-30 LAB — MAGNESIUM
Magnesium: 2.1 mg/dL (ref 1.7–2.4)
Magnesium: 2.1 mg/dL (ref 1.7–2.4)

## 2023-06-30 LAB — CBC
HCT: 32 % — ABNORMAL LOW (ref 36.0–46.0)
Hemoglobin: 10.4 g/dL — ABNORMAL LOW (ref 12.0–15.0)
MCH: 28.4 pg (ref 26.0–34.0)
MCHC: 32.5 g/dL (ref 30.0–36.0)
MCV: 87.4 fL (ref 80.0–100.0)
Platelets: 269 10*3/uL (ref 150–400)
RBC: 3.66 MIL/uL — ABNORMAL LOW (ref 3.87–5.11)
RDW: 14.5 % (ref 11.5–15.5)
WBC: 11.7 10*3/uL — ABNORMAL HIGH (ref 4.0–10.5)
nRBC: 0 % (ref 0.0–0.2)

## 2023-06-30 LAB — BLOOD GAS, ARTERIAL
Acid-base deficit: 0.7 mmol/L (ref 0.0–2.0)
Bicarbonate: 24.9 mmol/L (ref 20.0–28.0)
O2 Saturation: 96.2 %
Patient temperature: 36.6
pCO2 arterial: 43 mmHg (ref 32–48)
pH, Arterial: 7.37 (ref 7.35–7.45)
pO2, Arterial: 96 mmHg (ref 83–108)

## 2023-06-30 LAB — PHOSPHORUS
Phosphorus: 2.4 mg/dL — ABNORMAL LOW (ref 2.5–4.6)
Phosphorus: 2.3 mg/dL — ABNORMAL LOW (ref 2.5–4.6)

## 2023-06-30 MED ORDER — OXYCODONE HCL 5 MG PO TABS
5.0000 mg | ORAL_TABLET | Freq: Four times a day (QID) | ORAL | Status: DC
  Administered 2023-06-30 – 2023-07-02 (×8): 5 mg
  Filled 2023-06-30 (×8): qty 1

## 2023-06-30 MED ORDER — ORAL CARE MOUTH RINSE
15.0000 mL | OROMUCOSAL | Status: DC
  Administered 2023-07-01 – 2023-07-07 (×73): 15 mL via OROMUCOSAL

## 2023-06-30 MED ORDER — ORAL CARE MOUTH RINSE: 15.0000 mL | OROMUCOSAL | Status: DC | PRN

## 2023-06-30 MED ORDER — SODIUM CHLORIDE 0.9% FLUSH
10.0000 mL | INTRAVENOUS | Status: DC | PRN
  Administered 2023-07-12: 10 mL

## 2023-06-30 MED ORDER — PROPOFOL 1000 MG/100ML IV EMUL
5.0000 ug/kg/min | INTRAVENOUS | Status: DC
  Administered 2023-06-30: 5 ug/kg/min via INTRAVENOUS
  Administered 2023-06-30: 35 ug/kg/min via INTRAVENOUS
  Administered 2023-06-30: 40 ug/kg/min via INTRAVENOUS
  Administered 2023-07-01: 30 ug/kg/min via INTRAVENOUS
  Administered 2023-07-01: 50 ug/kg/min via INTRAVENOUS
  Administered 2023-07-01: 40 ug/kg/min via INTRAVENOUS
  Administered 2023-07-01: 50 ug/kg/min via INTRAVENOUS
  Administered 2023-07-02 (×3): 40 ug/kg/min via INTRAVENOUS
  Filled 2023-06-30 (×13): qty 100

## 2023-06-30 MED ORDER — MIDAZOLAM HCL 2 MG/2ML IJ SOLN: 2.0000 mg | Freq: Once | INTRAMUSCULAR | Status: AC

## 2023-06-30 MED ORDER — ORAL CARE MOUTH RINSE
15.0000 mL | OROMUCOSAL | Status: DC
  Administered 2023-06-30 (×5): 15 mL via OROMUCOSAL

## 2023-06-30 MED ORDER — POTASSIUM PHOSPHATES 15 MMOLE/5ML IV SOLN
30.0000 mmol | Freq: Once | INTRAVENOUS | Status: AC
  Administered 2023-06-30: 30 mmol via INTRAVENOUS
  Filled 2023-06-30: qty 10

## 2023-06-30 MED ORDER — QUETIAPINE FUMARATE 50 MG PO TABS
50.0000 mg | ORAL_TABLET | Freq: Two times a day (BID) | ORAL | Status: DC
  Administered 2023-06-30 – 2023-07-02 (×6): 50 mg
  Filled 2023-06-30 (×6): qty 1

## 2023-06-30 MED ORDER — SODIUM CHLORIDE 0.9% FLUSH
10.0000 mL | Freq: Two times a day (BID) | INTRAVENOUS | Status: DC
  Administered 2023-06-30: 30 mL
  Administered 2023-07-03 – 2023-07-05 (×3): 10 mL
  Administered 2023-07-05: 40 mL
  Administered 2023-07-06 – 2023-07-12 (×13): 10 mL

## 2023-06-30 MED ORDER — DEXMEDETOMIDINE HCL IN NACL 400 MCG/100ML IV SOLN: 0.0000 ug/kg/h | INTRAVENOUS | Status: DC

## 2023-06-30 MED ORDER — DEXMEDETOMIDINE HCL IN NACL 400 MCG/100ML IV SOLN
INTRAVENOUS | Status: AC
  Administered 2023-06-30: 0.4 ug/kg/h via INTRAVENOUS
  Filled 2023-06-30: qty 100

## 2023-06-30 MED ORDER — CLONAZEPAM 1 MG PO TABS
1.0000 mg | ORAL_TABLET | Freq: Two times a day (BID) | ORAL | Status: DC
  Administered 2023-06-30 – 2023-07-01 (×3): 1 mg
  Filled 2023-06-30 (×3): qty 1

## 2023-06-30 MED ORDER — SENNOSIDES 8.8 MG/5ML PO SYRP
10.0000 mL | ORAL_SOLUTION | Freq: Every day | ORAL | Status: DC
  Administered 2023-06-30 – 2023-07-01 (×2): 10 mL
  Filled 2023-06-30 (×2): qty 10

## 2023-06-30 NOTE — Progress Notes (Signed)
NAME:  Casey [name], MRN:  [id number], DOB:  1961-01-24, LOS: 4 ADMISSION DATE:  06/25/2023 CONSULTATION DATE:  06/26/2023 REFERRING MD:  Lyn Hollingshead - TRH ([facility]) CHIEF COMPLAINT:  AMS, SOB  History of Present Illness:  62 year old woman who presented to [facility] 8/17 as a transfer from [facility] ICU for AMS, hypoxia requiring BiPAP and possible aspiration PNA. PMHx significant for chronic pain, anxiety/depression, panic disorder, suicide attempt, polysubstance abuse.  Patient initially presented to [facility] ED 8/16 after being found at home unresponsive by family. EMS was called, administered Narcan 2mg  IN with improvement in mental status. Per chart review "possible took two Xanax that weren't Xanax". Patient initially refused transport to hospital but eventually agreed. Drowsy with garbled speech with EMS but able to follow commands. Hypoxic to 86% on RA, initially placed on [place] but escalated to NRB with improvement in SpO2 to 96%. In ED, patient was hypothermic, tachycardic to 100s, BP 139/95, RR 38, SpO2 89% on NRB. Labs were notable for WBC 10.6, Hgb 13.2, Plt 414. Na 136, K 3.5, CO2 22, Cr 0.76, LFTs WNL. UA unremarkable. Ethanol and APAP levels negative. UDS+ cocaine, BZD and THC. CXR demonstrated asymmetric bibasilar interstitial and airspace infiltrates c/f multifocal PNA/aspiration. Unasyn ordered in ED but changed to ceftriaxone due to ?allergy. Patient was subsequently admitted to [facility].  On 8/17, patient was noted to have persistent respiratory distress with escalating O2 requirements to HFNC and eventually BiPAP. CXR appeared worse with rapid progression of diffuse bilateral heterogeneous consolidative airspace disease in LUL and R lung, c/w worsened multifocal PNA and small layering R pleural effusion.   PCCM consulted for transfer to [facility] for further care.  Pertinent Medical History:   Past Medical History:  Diagnosis Date   Anxiety    Chronic back pain    Depression    History of panic attacks  09/11/2014   Medical history non-contributory    Polysubstance abuse (HCC)    History of taking non-prescribed opiates, BZDs; also +cocaine and THC   Ruptured disc, thoracic    Suicide attempt (HCC) 2002   Significant Hospital Events: Including procedures, antibiotic start and stop dates in addition to other pertinent events   8/16 - Presented to [facility] via EMS for AMS, hypoxia. Presumed drug intoxication. UDS +cocaine, BZDs, THC. CXR with multifocal PNA. Required [place] then NRB then HFNC. Empiric ceftriaxone. 8/17 - Worsening respiratory status requiring escalation to BiPAP. ABG poor. CXR worse. Transferred to [facility] for further care. 8/18: Opt-flow 8/20: placed on BiPAP overnight. Continued labored breathing in AM, subsequently intubated. Switched abx to ceftriaxone and vancomycin. Dc'd steroids. 8/21: continued agitation on precedex/fentanyl. Started on propofol gtt  Interim History / Subjective:  Continued agitation overnight on Precedex/fentanyl.  Switched precedex to propofol then had persistent symptoms this morning so Precedex was added back.  Also received Versed.  Objective:  Blood pressure (!) 98/59, pulse (!) 118, temperature 99.2 F (37.3 C), temperature source Axillary, resp. rate (!) 29, height 5\' 5"  (1.651 m), weight 65.6 kg, SpO2 94%.    Vent Mode: PRVC FiO2 (%):  [40 %-100 %] 40 % Set Rate:  [20 bmp-26 bmp] 26 bmp Vt Set:  [450 mL] 450 mL PEEP:  [5 cmH20] 5 cmH20 Plateau Pressure:  [22 cmH20] 22 cmH20   Intake/Output Summary (Last 24 hours) at 06/30/2023 0824 Last data filed at 06/30/2023 0600 Gross per 24 hour  Intake 2894.81 ml  Output 1650 ml  Net 1244.81 ml   Filed Weights   06/27/23 0500  06/29/23 0500 06/30/23 0500  Weight: 67 kg 64.4 kg 65.6 kg   Physical Examination: General: Acute on chronic ill-appearing middle-aged female lying in bed on vent, sedated HEENT: [place]/AT Neuro: nonfocal, sedated CV: tachycardic, regular rhythm, no mrg PULM: bibasilar crackles,  on vent, normal WOB GI: soft, non-tender, non-distended Extremities: warm/dry, no significant lower extremity edema Skin: no rashes or lesions  Resolved Hospital Problem List:    Assessment & Plan:  Acute hypoxemic respiratory failure in the setting of multifocal PNA P: -Intubated 8/20 due to bipap failure -Continued agitation overnight - currently on precedex, propofol, fentanyl gtt with PRN versed/atarax/fentanyl. Also on celexa and klonopin  -ABG stable -Start seroquel nightly  Ventilator support with lung protective strategies  Wean PEEP and FiO2 for sats greater than 90%. Head of bed elevated 30 degrees. Plateau pressures less than 30 cm H20.  Off steroids Bronchodilators Pulmonary hygiene with CPT, frequent I-S and flutter valve  Multifocal PNA, presumed aspiration Leukocytosis in the setting of above (improving - 11.7 from 30.8) Small R pleural effusion P: Strep urine negative Guaifenesin Dry productive cough, no sputum for respiratory culture -Continue ceftriaxone and vancomycin. MRSA swab +. Bcx NGTD.  RPP negative Urine legionella negative   Chronic pain -PDMP reviewed, patient does not have any controlled substance prescriptions prescribed to her at this time. P: Monitor for signs of withdrawal Maximize nonopioid adjuncts  Polysubstance abuse -UDS +cocaine, BZDs, THC. History of taking non-prescribed opiates/BZDs per family. She reports daily use of Xanax that she "buys off the street", cannot tell me the dose or amount of benzodiazepine use. Denies cocaine use P: Cessation education when appropriate Monitor for withdrawal as above TOC consult when appropriate  GAD with panic disorder Depression P: On Precedex, wean as tolerated Continue home Celexa  PRN atarax Additional sedation as noted  Best Practice: (right click and "Reselect all SmartList Selections" daily)   Diet/type: tubefeeds per RD  DVT prophylaxis: SCDs, SQH GI prophylaxis:  PPI Lines: N/A Foley:  N/A Code Status:  full code Last date of multidisciplinary goals of care discussion: with  patient.     Vonna Drafts, MD 06/30/2023, 8:24 AM

## 2023-06-30 NOTE — Progress Notes (Signed)
VAST consulted to obtain USGIV access for vasopressors. After further review of chart, noted patient appropriate candidate for PICC. Contacted primary nurse via SecureChat who spoke with care team. PICC ordered. Patient's right and left upper arms assessed utilizing ultrasound. Advised primary RN that if IV access needed before PICC can be placed, to reach out to VAST with IV consult and we will come place USGIV in interim. Shanda Bumps, RN verbalized understanding.

## 2023-06-30 NOTE — Progress Notes (Signed)
PCCM progress note  Called to bedside and for agitation, tachycardia, and tachypnea despite propofol and fentanyl drip.  Patient appears uncomfortable with increased work of breathing.  Will give additional Versed push now and start Precedex drip in addition to propofol and fentanyl.  Patient has an extensive history of illicit benzodiazepine use with acute concern for withdrawal.  Will also start p.o. Klonopin.  [name], NP-C [facility] Personal contact information can be found on Amion  If no contact or response made please call [phone] 06/30/2023, 7:49 AM

## 2023-06-30 NOTE — Progress Notes (Signed)
eLink Physician-Brief Progress Note Patient Name: Casey [name] DOB: 05/20/1961 MRN: [id number]   Date of Service  06/30/2023  HPI/Events of Note  Patient wide awake and extremely agitated despite maximum dose Precedex, 300 mcg of Fentanyl gtt and PRN IV Versed boluses.  eICU Interventions  Precedex gtt discontinued and Propofol substituted.        Casey [name] 06/30/2023, 5:15 AM

## 2023-06-30 NOTE — Progress Notes (Signed)
Pharmacy Electrolyte Replacement  Recent Labs:  Recent Labs    06/30/23 0528  K 3.8  MG 2.1  PHOS 2.4*  CREATININE 0.97    Low Critical Values (K </= 2.5, Phos </= 1, Mg </= 1) Present: None  MD Contacted: n/a  Plan:  -30 mmol KPhos IV x1   Stephenie Acres, PharmD PGY1 Pharmacy Resident 06/30/2023 8:30 AM

## 2023-06-30 NOTE — Progress Notes (Signed)
Peripherally Inserted Central Catheter Placement  The IV Nurse has discussed with the patient and/or persons authorized to consent for the patient, the purpose of this procedure and the potential benefits and risks involved with this procedure.  The benefits include less needle sticks, lab draws from the catheter, and the patient may be discharged home with the catheter. Risks include, but not limited to, infection, bleeding, blood clot (thrombus formation), and puncture of an artery; nerve damage and irregular heartbeat and possibility to perform a PICC exchange if needed/ordered by physician.  Alternatives to this procedure were also discussed.  Bard Power PICC patient education guide, fact sheet on infection prevention and patient information card has been provided to patient /or left at bedside.   Consent obtained via telephone with son  PICC Placement Documentation  PICC Triple Lumen 06/30/23 Right Basilic 38 cm 1 cm (Active)  Indication for Insertion or Continuance of Line Vasoactive infusions 06/30/23 1500  Exposed Catheter (cm) 1 cm 06/30/23 1500  Site Assessment Clean, Dry, Intact 06/30/23 1500  Lumen #1 Status Flushed;Saline locked;Blood return noted 06/30/23 1500  Lumen #2 Status Flushed;Saline locked;Blood return noted 06/30/23 1500  Lumen #3 Status Flushed;Saline locked;Blood return noted 06/30/23 1500  Dressing Type Transparent;Securing device 06/30/23 1500  Dressing Status Antimicrobial disc in place;Clean, Dry, Intact 06/30/23 1500  Line Care Connections checked and tightened 06/30/23 1500  Line Adjustment (NICU/IV Team Only) No 06/30/23 1500  Dressing Intervention New dressing 06/30/23 1500  Dressing Change Due 07/07/23 06/30/23 1500       Franne Grip Renee 06/30/2023, 3:12 PM

## 2023-06-30 NOTE — Plan of Care (Signed)
  Problem: Nutrition: Goal: Adequate nutrition will be maintained Outcome: Progressing   Problem: Pain Managment: Goal: General experience of comfort will improve Outcome: Progressing    Patient is up to goal on tube feeding rate and having no vomitting at this time. Patient is more adequately sedated but still following commands.   Problem: Education: Goal: Knowledge of General Education information will improve Description: Including pain rating scale, medication(s)/side effects and non-pharmacologic comfort measures Outcome: Not Progressing   Problem: Health Behavior/Discharge Planning: Goal: Ability to manage health-related needs will improve Outcome: Not Progressing   Problem: Clinical Measurements: Goal: Ability to maintain clinical measurements within normal limits will improve Outcome: Not Progressing Goal: Will remain free from infection Outcome: Not Progressing Goal: Diagnostic test results will improve Outcome: Not Progressing Goal: Respiratory complications will improve Outcome: Not Progressing Goal: Cardiovascular complication will be avoided Outcome: Not Progressing   Problem: Activity: Goal: Risk for activity intolerance will decrease Outcome: Not Progressing   Problem: Coping: Goal: Level of anxiety will decrease Outcome: Not Progressing   Problem: Elimination: Goal: Will not experience complications related to bowel motility Outcome: Not Progressing Goal: Will not experience complications related to urinary retention Outcome: Not Progressing   Problem: Safety: Goal: Ability to remain free from injury will improve Outcome: Not Progressing   Problem: Skin Integrity: Goal: Risk for impaired skin integrity will decrease Outcome: Not Progressing   Problem: Safety: Goal: Non-violent Restraint(s) Outcome: Not Progressing   Problem: Activity: Goal: Ability to tolerate increased activity will improve Outcome: Not Progressing   Problem: Clinical  Measurements: Goal: Ability to maintain a body temperature in the normal range will improve Outcome: Not Progressing   Problem: Respiratory: Goal: Ability to maintain adequate ventilation will improve Outcome: Not Progressing Goal: Ability to maintain a clear airway will improve Outcome: Not Progressing    Patient is unable to convey comprehension of education at this time. Patient has had unstable vitals requiring low dose pressors. Patient was very dyssynchronus with the ventilator earlier in the shift but is doing better with vent synchrony and anxiety/agitation. Patient unable to move much in bed until agitated and then becomes a hazard to herself via pulling out the ETT and other lines/equipment and requires restraints.

## 2023-06-30 NOTE — Progress Notes (Signed)
CPT held at this time due to HR and increased agitation.

## 2023-06-30 NOTE — Progress Notes (Signed)
SLP Cancellation Note  Patient Details Name: Casey [name] MRN: [id number] DOB: 11-Dec-1960   Cancelled treatment:       Reason Eval/Treat Not Completed: Patient not medically ready. Pt intubated. Will sign off and await new orders   Darryel Diodato, Riley Nearing 06/30/2023, 8:45 AM

## 2023-06-30 NOTE — Procedures (Signed)
Cortrak  Tube Type:  Cortrak - 43 inches Tube Location:  Left nare Initial Placement:  Stomach Secured by: Bridle Technique Used to Measure Tube Placement:  Marking at nare/corner of mouth Cortrak Secured At:  68 cm   Cortrak Tube Team Note:  Consult received to place a Cortrak feeding tube.   X-ray is required, abdominal x-ray has been ordered by the Cortrak team. Please confirm tube placement before using the Cortrak tube.   If the tube becomes dislodged please keep the tube and contact the Cortrak team at [email or web address] for replacement.  If after hours and replacement cannot be delayed, place a NG tube and confirm placement with an abdominal x-ray.    Koleen Distance MS, RD, LDN Please refer to [facility] for RD and/or RD on-call/weekend/after hours pager

## 2023-07-01 ENCOUNTER — Inpatient Hospital Stay (HOSPITAL_COMMUNITY): Payer: MEDICAID

## 2023-07-01 DIAGNOSIS — J9601 Acute respiratory failure with hypoxia: Secondary | ICD-10-CM | POA: Diagnosis not present

## 2023-07-01 LAB — GLUCOSE, CAPILLARY
Glucose-Capillary: 137 mg/dL — ABNORMAL HIGH (ref 70–99)
Glucose-Capillary: 124 mg/dL — ABNORMAL HIGH (ref 70–99)
Glucose-Capillary: 135 mg/dL — ABNORMAL HIGH (ref 70–99)
Glucose-Capillary: 147 mg/dL — ABNORMAL HIGH (ref 70–99)
Glucose-Capillary: 109 mg/dL — ABNORMAL HIGH (ref 70–99)
Glucose-Capillary: 139 mg/dL — ABNORMAL HIGH (ref 70–99)

## 2023-07-01 LAB — POCT I-STAT 7, (LYTES, BLD GAS, ICA,H+H)
Acid-Base Excess: 2 mmol/L (ref 0.0–2.0)
Bicarbonate: 28.2 mmol/L — ABNORMAL HIGH (ref 20.0–28.0)
Calcium, Ion: 1.18 mmol/L (ref 1.15–1.40)
HCT: 43 % (ref 36.0–46.0)
Hemoglobin: 14.6 g/dL (ref 12.0–15.0)
O2 Saturation: 97 %
Patient temperature: 98.7
Potassium: 3.3 mmol/L — ABNORMAL LOW (ref 3.5–5.1)
Sodium: 143 mmol/L (ref 135–145)
TCO2: 30 mmol/L (ref 22–32)
pCO2 arterial: 47.5 mmHg (ref 32–48)
pH, Arterial: 7.382 (ref 7.35–7.45)
pO2, Arterial: 90 mmHg (ref 83–108)

## 2023-07-01 LAB — CBC
HCT: 27.3 % — ABNORMAL LOW (ref 36.0–46.0)
Hemoglobin: 8.9 g/dL — ABNORMAL LOW (ref 12.0–15.0)
MCH: 27.9 pg (ref 26.0–34.0)
MCHC: 32.6 g/dL (ref 30.0–36.0)
MCV: 85.6 fL (ref 80.0–100.0)
Platelets: 240 10*3/uL (ref 150–400)
RBC: 3.19 MIL/uL — ABNORMAL LOW (ref 3.87–5.11)
RDW: 14.6 % (ref 11.5–15.5)
WBC: 12.5 10*3/uL — ABNORMAL HIGH (ref 4.0–10.5)
nRBC: 0 % (ref 0.0–0.2)

## 2023-07-01 LAB — BASIC METABOLIC PANEL
Anion gap: 8 (ref 5–15)
BUN: 16 mg/dL (ref 8–23)
CO2: 27 mmol/L (ref 22–32)
Calcium: 7.7 mg/dL — ABNORMAL LOW (ref 8.9–10.3)
Chloride: 107 mmol/L (ref 98–111)
Creatinine, Ser: 0.41 mg/dL — ABNORMAL LOW (ref 0.44–1.00)
GFR, Estimated: >60 mL/min (ref >60)
Glucose, Bld: 139 mg/dL — ABNORMAL HIGH (ref 70–99)
Potassium: 3.3 mmol/L — ABNORMAL LOW (ref 3.5–5.1)
Sodium: 142 mmol/L (ref 135–145)

## 2023-07-01 LAB — MAGNESIUM: Magnesium: 1.9 mg/dL (ref 1.7–2.4)

## 2023-07-01 LAB — TRIGLYCERIDES: Triglycerides: 295 mg/dL — ABNORMAL HIGH (ref <150)

## 2023-07-01 LAB — PHOSPHORUS: Phosphorus: 1.8 mg/dL — ABNORMAL LOW (ref 2.5–4.6)

## 2023-07-01 MED ORDER — MAGNESIUM SULFATE 2 GM/50ML IV SOLN
2.0000 g | Freq: Once | INTRAVENOUS | Status: AC
  Administered 2023-07-01: 2 g via INTRAVENOUS
  Filled 2023-07-01: qty 50

## 2023-07-01 MED ORDER — CLONAZEPAM 1 MG PO TABS
2.0000 mg | ORAL_TABLET | Freq: Two times a day (BID) | ORAL | Status: DC
  Administered 2023-07-01: 2 mg
  Filled 2023-07-01: qty 2

## 2023-07-01 MED ORDER — POTASSIUM PHOSPHATES 15 MMOLE/5ML IV SOLN
30.0000 mmol | Freq: Once | INTRAVENOUS | Status: AC
  Administered 2023-07-01: 30 mmol via INTRAVENOUS
  Filled 2023-07-01: qty 10

## 2023-07-01 MED ORDER — POTASSIUM CHLORIDE 20 MEQ PO PACK
20.0000 meq | PACK | Freq: Once | ORAL | Status: AC
  Administered 2023-07-01: 20 meq
  Filled 2023-07-01: qty 1

## 2023-07-01 NOTE — Progress Notes (Signed)
CPT held at this time. Pt HR 120's.

## 2023-07-01 NOTE — Progress Notes (Signed)
NAME:  Casey [name], MRN:  [id number], DOB:  Oct 02, 1961, LOS: 5 ADMISSION DATE:  06/25/2023 CONSULTATION DATE:  06/26/2023 REFERRING MD:  Lyn Hollingshead - TRH ([facility]) CHIEF COMPLAINT:  AMS, SOB  History of Present Illness:  62 year old woman who presented to [facility] 8/17 as a transfer from [facility] ICU for AMS, hypoxia requiring BiPAP and possible aspiration PNA. PMHx significant for chronic pain, anxiety/depression, panic disorder, suicide attempt, polysubstance abuse.  Patient initially presented to [facility] ED 8/16 after being found at home unresponsive by family. EMS was called, administered Narcan 2mg  IN with improvement in mental status. Per chart review "possible took two Xanax that weren't Xanax". Patient initially refused transport to hospital but eventually agreed. Drowsy with garbled speech with EMS but able to follow commands. Hypoxic to 86% on RA, initially placed on [place] but escalated to NRB with improvement in SpO2 to 96%. In ED, patient was hypothermic, tachycardic to 100s, BP 139/95, RR 38, SpO2 89% on NRB. Labs were notable for WBC 10.6, Hgb 13.2, Plt 414. Na 136, K 3.5, CO2 22, Cr 0.76, LFTs WNL. UA unremarkable. Ethanol and APAP levels negative. UDS+ cocaine, BZD and THC. CXR demonstrated asymmetric bibasilar interstitial and airspace infiltrates c/f multifocal PNA/aspiration. Unasyn ordered in ED but changed to ceftriaxone due to ?allergy. Patient was subsequently admitted to [facility].  On 8/17, patient was noted to have persistent respiratory distress with escalating O2 requirements to HFNC and eventually BiPAP. CXR appeared worse with rapid progression of diffuse bilateral heterogeneous consolidative airspace disease in LUL and R lung, c/w worsened multifocal PNA and small layering R pleural effusion.   PCCM consulted for transfer to [name] for further care.  Pertinent Medical History:   Past Medical History:  Diagnosis Date   Anxiety    Chronic back pain    Depression    History of panic attacks  09/11/2014   Medical history non-contributory    Polysubstance abuse (HCC)    History of taking non-prescribed opiates, BZDs; also +cocaine and THC   Ruptured disc, thoracic    Suicide attempt (HCC) 2002   Significant Hospital Events: Including procedures, antibiotic start and stop dates in addition to other pertinent events   8/16 - Presented to [facility] via EMS for AMS, hypoxia. Presumed drug intoxication. UDS +cocaine, BZDs, THC. CXR with multifocal PNA. Required [place] then NRB then HFNC. Empiric ceftriaxone. 8/17 - Worsening respiratory status requiring escalation to BiPAP. ABG poor. CXR worse. Transferred to [facility] for further care. 8/18: Opt-flow 8/20: placed on BiPAP overnight. Continued labored breathing in AM, subsequently intubated. Switched abx to ceftriaxone and vancomycin. Dc'd steroids. 8/21: continued agitation on precedex/fentanyl. Started on propofol gtt. Started on seroquel 8/22: DC antibiotics. off precedex. Weaning prop/fent  Interim History / Subjective:  NAEON, agitation improving  Objective:  Blood pressure 109/66, pulse (!) 116, temperature 99.1 F (37.3 C), temperature source Axillary, resp. rate (!) 26, height 5\' 5"  (1.651 m), weight 67.9 kg, SpO2 95%.    Vent Mode: PRVC FiO2 (%):  [40 %] 40 % Set Rate:  [26 bmp] 26 bmp Vt Set:  [450 mL] 450 mL PEEP:  [5 cmH20] 5 cmH20 Plateau Pressure:  [20 cmH20-26 cmH20] 20 cmH20   Intake/Output Summary (Last 24 hours) at 07/01/2023 0803 Last data filed at 07/01/2023 0700 Gross per 24 hour  Intake 3422.38 ml  Output 1075 ml  Net 2347.38 ml   Filed Weights   06/29/23 0500 06/30/23 0500 07/01/23 0500  Weight: 64.4 kg 65.6 kg 67.9 kg  Physical Examination: General: Acute on chronic ill-appearing middle-aged female lying in bed on vent in NAD HEENT: [place]/AT Neuro: nonfocal, awake, follows commands CV: tachycardic, regular rhythm, no mrg PULM: RLL rhonchi, otherwise clear breath sounds, on vent, normal WOB GI: soft,  non-tender, non-distended Extremities: warm/dry, no significant lower extremity edema Skin: no rashes or lesions  Resolved Hospital Problem List:    Assessment & Plan:  Acute hypoxemic respiratory failure in the setting of multifocal PNA P: -Intubated 8/20 due to bipap failure -For agitation, currently on propofol and fentanyl gtt with PRN versed/atarax/fentanyl. Also on celexa and klonopin. Seroquel started 8/21. -Continue to wean propofol and fentanyl as tolerated today  -ABG stable Ventilator support with lung protective strategies  Wean PEEP and FiO2 for sats greater than 90%. Head of bed elevated 30 degrees. Plateau pressures less than 30 cm H20.  Off steroids Bronchodilators Pulmonary hygiene with CPT, frequent I-S and flutter valve  Multifocal PNA, presumed aspiration Leukocytosis in the setting of above (12.5 today) Small R pleural effusion P: Strep urine negative Guaifenesin Dry productive cough, no sputum for respiratory culture RPP negative Urine legionella negative  MRSA +, Bcx NGTD S/p ceftriaxone and vancomycin -DC antibiotics  Chronic pain -PDMP reviewed, patient does not have any controlled substance prescriptions prescribed to her at this time. P: Monitor for signs of withdrawal Maximize nonopioid adjuncts  Polysubstance abuse -UDS +cocaine, BZDs, THC. History of taking non-prescribed opiates/BZDs per family. She reports daily use of Xanax that she "buys off the street", cannot tell me the dose or amount of benzodiazepine use. Denies cocaine use P: Cessation education when appropriate Monitor for withdrawal as above TOC consult when appropriate  GAD with panic disorder Depression P: On sedation as noted, wean as tolerated Continue home Celexa  PRN atarax   Best Practice: (right click and "Reselect all SmartList Selections" daily)   Diet/type: tubefeeds per RD  DVT prophylaxis: SCDs, SQH GI prophylaxis: PPI Lines: PICC Foley:  N/A Code  Status:  full code Last date of multidisciplinary goals of care discussion: with  patient.     Vonna Drafts, MD 07/01/2023, 8:03 AM

## 2023-07-01 NOTE — Progress Notes (Signed)
[facility] ADULT ICU REPLACEMENT PROTOCOL   The patient does apply for the [facility] Adult ICU Electrolyte Replacment Protocol based on the criteria listed below:   1.Exclusion criteria: TCTS, ECMO, Dialysis, and Myasthenia Gravis patients 2. Is GFR >/= 30 ml/min? Yes.    Patient's GFR today is >60 3. Is SCr </= 2? Yes.   Patient's SCr is 0.41 mg/dL 4. Did SCr increase >/= 0.5 in 24 hours? No. 5.Pt's weight >40kg  Yes.   6. Abnormal electrolyte(s): potassium 3.3, phos 1.8, mag 1.9  7. Electrolytes replaced per protocol 8.  Call MD STAT for K+ </= 2.5, Phos </= 1, or Mag </= 1 Physician:  protocol  Melvern Banker 07/01/2023 5:18 AM

## 2023-07-01 NOTE — Progress Notes (Signed)
CPT held at this time pt HR 130's.

## 2023-07-02 DIAGNOSIS — J9601 Acute respiratory failure with hypoxia: Secondary | ICD-10-CM | POA: Diagnosis not present

## 2023-07-02 LAB — GLUCOSE, CAPILLARY
Glucose-Capillary: 128 mg/dL — ABNORMAL HIGH (ref 70–99)
Glucose-Capillary: 127 mg/dL — ABNORMAL HIGH (ref 70–99)
Glucose-Capillary: 98 mg/dL (ref 70–99)
Glucose-Capillary: 118 mg/dL — ABNORMAL HIGH (ref 70–99)
Glucose-Capillary: 100 mg/dL — ABNORMAL HIGH (ref 70–99)
Glucose-Capillary: 106 mg/dL — ABNORMAL HIGH (ref 70–99)

## 2023-07-02 LAB — BASIC METABOLIC PANEL
Anion gap: 8 (ref 5–15)
BUN: 13 mg/dL (ref 8–23)
CO2: 29 mmol/L (ref 22–32)
Calcium: 7.6 mg/dL — ABNORMAL LOW (ref 8.9–10.3)
Chloride: 105 mmol/L (ref 98–111)
Creatinine, Ser: 0.67 mg/dL (ref 0.44–1.00)
GFR, Estimated: >60 mL/min (ref >60)
Glucose, Bld: 143 mg/dL — ABNORMAL HIGH (ref 70–99)
Potassium: 3.4 mmol/L — ABNORMAL LOW (ref 3.5–5.1)
Sodium: 142 mmol/L (ref 135–145)

## 2023-07-02 LAB — CBC
HCT: 26.7 % — ABNORMAL LOW (ref 36.0–46.0)
Hemoglobin: 8.8 g/dL — ABNORMAL LOW (ref 12.0–15.0)
MCH: 28.4 pg (ref 26.0–34.0)
MCHC: 33 g/dL (ref 30.0–36.0)
MCV: 86.1 fL (ref 80.0–100.0)
Platelets: 234 10*3/uL (ref 150–400)
RBC: 3.1 MIL/uL — ABNORMAL LOW (ref 3.87–5.11)
RDW: 14.6 % (ref 11.5–15.5)
WBC: 9.9 10*3/uL (ref 4.0–10.5)
nRBC: 0.2 % (ref 0.0–0.2)

## 2023-07-02 LAB — MAGNESIUM: Magnesium: 2.1 mg/dL (ref 1.7–2.4)

## 2023-07-02 LAB — PHOSPHORUS: Phosphorus: 2.9 mg/dL (ref 2.5–4.6)

## 2023-07-02 MED ORDER — CLONAZEPAM 1 MG PO TABS
2.0000 mg | ORAL_TABLET | Freq: Three times a day (TID) | ORAL | Status: DC
  Administered 2023-07-02 – 2023-07-08 (×19): 2 mg
  Filled 2023-07-02 (×19): qty 2

## 2023-07-02 MED ORDER — OXYCODONE HCL 5 MG PO TABS
10.0000 mg | ORAL_TABLET | Freq: Four times a day (QID) | ORAL | Status: DC
  Administered 2023-07-02 – 2023-07-08 (×26): 10 mg
  Filled 2023-07-02 (×27): qty 2

## 2023-07-02 MED ORDER — POLYETHYLENE GLYCOL 3350 17 G PO PACK
17.0000 g | PACK | Freq: Two times a day (BID) | ORAL | Status: DC
  Administered 2023-07-02 – 2023-07-04 (×5): 17 g
  Filled 2023-07-02 (×5): qty 1

## 2023-07-02 MED ORDER — POTASSIUM CHLORIDE 20 MEQ PO PACK
60.0000 meq | PACK | Freq: Once | ORAL | Status: AC
  Administered 2023-07-02: 60 meq
  Filled 2023-07-02: qty 3

## 2023-07-02 MED ORDER — LACTULOSE 10 GM/15ML PO SOLN
20.0000 g | Freq: Once | ORAL | Status: AC
  Administered 2023-07-02: 20 g
  Filled 2023-07-02: qty 30

## 2023-07-02 MED ORDER — SENNOSIDES 8.8 MG/5ML PO SYRP
10.0000 mL | ORAL_SOLUTION | Freq: Two times a day (BID) | ORAL | Status: DC
  Administered 2023-07-02 – 2023-07-04 (×5): 10 mL
  Filled 2023-07-02 (×5): qty 10

## 2023-07-02 NOTE — Progress Notes (Signed)
NAME:  Casey [name], MRN:  [id number], DOB:  05-Sep-1961, LOS: 6 ADMISSION DATE:  06/25/2023 CONSULTATION DATE:  06/26/2023 REFERRING MD:  Lyn Hollingshead - TRH ([facility]) CHIEF COMPLAINT:  AMS, SOB  History of Present Illness:  62 year old woman who presented to [facility] 8/17 as a transfer from [facility] ICU for AMS, hypoxia requiring BiPAP and possible aspiration PNA. PMHx significant for chronic pain, anxiety/depression, panic disorder, suicide attempt, polysubstance abuse.  Patient initially presented to [facility] ED 8/16 after being found at home unresponsive by family. EMS was called, administered Narcan 2mg  IN with improvement in mental status. Per chart review "possible took two Xanax that weren't Xanax". Patient initially refused transport to hospital but eventually agreed. Drowsy with garbled speech with EMS but able to follow commands. Hypoxic to 86% on RA, initially placed on [place] but escalated to NRB with improvement in SpO2 to 96%. In ED, patient was hypothermic, tachycardic to 100s, BP 139/95, RR 38, SpO2 89% on NRB. Labs were notable for WBC 10.6, Hgb 13.2, Plt 414. Na 136, K 3.5, CO2 22, Cr 0.76, LFTs WNL. UA unremarkable. Ethanol and APAP levels negative. UDS+ cocaine, BZD and THC. CXR demonstrated asymmetric bibasilar interstitial and airspace infiltrates c/f multifocal PNA/aspiration. Unasyn ordered in ED but changed to ceftriaxone due to ?allergy. Patient was subsequently admitted to [facility].  On 8/17, patient was noted to have persistent respiratory distress with escalating O2 requirements to HFNC and eventually BiPAP. CXR appeared worse with rapid progression of diffuse bilateral heterogeneous consolidative airspace disease in LUL and R lung, c/w worsened multifocal PNA and small layering R pleural effusion.   PCCM consulted for transfer to [facility] for further care.  Pertinent Medical History:   Past Medical History:  Diagnosis Date   Anxiety    Chronic back pain    Depression    History of panic attacks  09/11/2014   Medical history non-contributory    Polysubstance abuse (HCC)    History of taking non-prescribed opiates, BZDs; also +cocaine and THC   Ruptured disc, thoracic    Suicide attempt (HCC) 2002   Significant Hospital Events: Including procedures, antibiotic start and stop dates in addition to other pertinent events   8/16 - Presented to [facility] via EMS for AMS, hypoxia. Presumed drug intoxication. UDS +cocaine, BZDs, THC. CXR with multifocal PNA. Required [place] then NRB then HFNC. Empiric ceftriaxone. 8/17 - Worsening respiratory status requiring escalation to BiPAP. ABG poor. CXR worse. Transferred to [facility] for further care. 8/18: Opt-flow 8/20: placed on BiPAP overnight. Continued labored breathing in AM, subsequently intubated. Switched abx to ceftriaxone and vancomycin. Dc'd steroids. 8/21: continued agitation on precedex/fentanyl. Started on propofol gtt. Started on seroquel 8/22: DC antibiotics. off precedex. Weaning prop/fent  Interim History / Subjective:  NAEON, still tachypneic and uncomfortable with SBT  Objective:  Blood pressure 113/63, pulse 90, temperature 98.7 F (37.1 C), temperature source Axillary, resp. rate (!) 33, height 5\' 5"  (1.651 m), weight 67.9 kg, SpO2 98%.    Vent Mode: PSV;CPAP FiO2 (%):  [40 %] 40 % Set Rate:  [26 bmp] 26 bmp Vt Set:  [450 mL] 450 mL PEEP:  [5 cmH20] 5 cmH20 Pressure Support:  [5 cmH20-10 cmH20] 10 cmH20 Plateau Pressure:  [20 cmH20-21 cmH20] 21 cmH20   Intake/Output Summary (Last 24 hours) at 07/02/2023 1045 Last data filed at 07/02/2023 0900 Gross per 24 hour  Intake 2285.68 ml  Output 1375 ml  Net 910.68 ml   Filed Weights   06/30/23 0500 07/01/23 0500 07/02/23  0500  Weight: 65.6 kg 67.9 kg 67.9 kg   Physical Examination: General: Acute on chronic ill-appearing middle-aged female lying in bed on vent in NAD HEENT: [place]/AT Neuro: nonfocal, awake, follows commands CV: RRR no mrg PULM: RLL rhonchi/wheezing improved from prior  exam, otherwise clear breath sounds, on vent, normal WOB GI: soft, non-tender, non-distended Extremities: warm/dry, no significant lower extremity edema Skin: no rashes or lesions  Resolved Hospital Problem List:    Assessment & Plan:  Acute hypoxemic respiratory failure in the setting of multifocal PNA P: -Intubated 8/20 due to bipap failure. Remains intubated due to continued agitation and respiratory distress when attempting to wean vent -For agitation, currently on propofol and fentanyl gtt with PRN versed/atarax/fentanyl. Also on celexa, oxycodone, and klonopin. Seroquel started 8/21. -Continue to wean propofol and fentanyl as tolerated today  -Increase klonopin and oxy today Ventilator support with lung protective strategies  Wean PEEP and FiO2 for sats greater than 90%. Head of bed elevated 30 degrees. Plateau pressures less than 30 cm H20.  Off steroids Continue bronchodilators Pulmonary hygiene with CPT, frequent I-S and flutter valve SBT  Multifocal PNA, presumed aspiration Leukocytosis in the setting of above (12.5 today) Small R pleural effusion P: Strep urine negative Guaifenesin Dry productive cough, no sputum for respiratory culture RPP negative Urine legionella negative  MRSA +, Bcx NGTD S/p ceftriaxone and vancomycin -Off antibiotics  Chronic pain -PDMP reviewed, patient does not have any controlled substance prescriptions prescribed to her at this time. P: Monitor for signs of withdrawal Maximize nonopioid adjuncts  Polysubstance abuse -UDS +cocaine, BZDs, THC. History of taking non-prescribed opiates/BZDs per family. She reports daily use of Xanax that she "buys off the street", cannot tell me the dose or amount of benzodiazepine use. Denies cocaine use P: Cessation education when appropriate Monitor for withdrawal as above TOC consult when appropriate  GAD with panic disorder Depression P: On sedation as noted, wean as tolerated Continue home  Celexa  PRN atarax   Best Practice: (right click and "Reselect all SmartList Selections" daily)   Diet/type: tubefeeds per RD  DVT prophylaxis: SCDs, SQH GI prophylaxis: PPI Lines: PICC Foley:  Yes, and it is still needed Code Status:  full code Last date of multidisciplinary goals of care discussion: with  patient.     Vonna Drafts, MD 07/02/2023, 10:45 AM

## 2023-07-03 ENCOUNTER — Inpatient Hospital Stay (HOSPITAL_COMMUNITY): Payer: MEDICAID

## 2023-07-03 DIAGNOSIS — J9601 Acute respiratory failure with hypoxia: Secondary | ICD-10-CM | POA: Diagnosis not present

## 2023-07-03 DIAGNOSIS — G9341 Metabolic encephalopathy: Secondary | ICD-10-CM | POA: Diagnosis not present

## 2023-07-03 LAB — CBC
HCT: 26.5 % — ABNORMAL LOW (ref 36.0–46.0)
Hemoglobin: 8.6 g/dL — ABNORMAL LOW (ref 12.0–15.0)
MCH: 27.9 pg (ref 26.0–34.0)
MCHC: 32.5 g/dL (ref 30.0–36.0)
MCV: 86 fL (ref 80.0–100.0)
Platelets: 239 10*3/uL (ref 150–400)
RBC: 3.08 MIL/uL — ABNORMAL LOW (ref 3.87–5.11)
RDW: 14.8 % (ref 11.5–15.5)
WBC: 9.6 10*3/uL (ref 4.0–10.5)
nRBC: 0 % (ref 0.0–0.2)

## 2023-07-03 LAB — BASIC METABOLIC PANEL
Anion gap: 11 (ref 5–15)
BUN: 12 mg/dL (ref 8–23)
CO2: 28 mmol/L (ref 22–32)
Calcium: 7.9 mg/dL — ABNORMAL LOW (ref 8.9–10.3)
Chloride: 102 mmol/L (ref 98–111)
Creatinine, Ser: 0.39 mg/dL — ABNORMAL LOW (ref 0.44–1.00)
GFR, Estimated: >60 mL/min (ref >60)
Glucose, Bld: 107 mg/dL — ABNORMAL HIGH (ref 70–99)
Potassium: 4.2 mmol/L (ref 3.5–5.1)
Sodium: 141 mmol/L (ref 135–145)

## 2023-07-03 LAB — CULTURE, BLOOD (ROUTINE X 2)
Culture: NO GROWTH
Culture: NO GROWTH
Special Requests: ADEQUATE
Special Requests: ADEQUATE

## 2023-07-03 LAB — PHOSPHORUS: Phosphorus: 4.3 mg/dL (ref 2.5–4.6)

## 2023-07-03 LAB — PROCALCITONIN: Procalcitonin: 0.18 ng/mL

## 2023-07-03 LAB — GLUCOSE, CAPILLARY
Glucose-Capillary: 108 mg/dL — ABNORMAL HIGH (ref 70–99)
Glucose-Capillary: 112 mg/dL — ABNORMAL HIGH (ref 70–99)
Glucose-Capillary: 126 mg/dL — ABNORMAL HIGH (ref 70–99)
Glucose-Capillary: 133 mg/dL — ABNORMAL HIGH (ref 70–99)
Glucose-Capillary: 141 mg/dL — ABNORMAL HIGH (ref 70–99)
Glucose-Capillary: 143 mg/dL — ABNORMAL HIGH (ref 70–99)

## 2023-07-03 LAB — MAGNESIUM: Magnesium: 2.2 mg/dL (ref 1.7–2.4)

## 2023-07-03 MED ORDER — FENTANYL 2500MCG IN NS 250ML (10MCG/ML) PREMIX INFUSION
50.0000 ug/h | INTRAVENOUS | Status: DC
  Administered 2023-07-03: 50 ug/h via INTRAVENOUS
  Filled 2023-07-03: qty 250

## 2023-07-03 MED ORDER — QUETIAPINE FUMARATE 100 MG PO TABS
100.0000 mg | ORAL_TABLET | Freq: Two times a day (BID) | ORAL | Status: DC
  Administered 2023-07-03 – 2023-07-08 (×12): 100 mg
  Filled 2023-07-03 (×12): qty 1

## 2023-07-03 MED ORDER — DEXMEDETOMIDINE HCL IN NACL 400 MCG/100ML IV SOLN
0.0000 ug/kg/h | INTRAVENOUS | Status: DC
  Administered 2023-07-03: 0.4 ug/kg/h via INTRAVENOUS
  Administered 2023-07-03: 1.2 ug/kg/h via INTRAVENOUS
  Administered 2023-07-03: 0.8 ug/kg/h via INTRAVENOUS
  Administered 2023-07-04: 1.2 ug/kg/h via INTRAVENOUS
  Administered 2023-07-04: 0.9 ug/kg/h via INTRAVENOUS
  Administered 2023-07-04 (×2): 1.2 ug/kg/h via INTRAVENOUS
  Administered 2023-07-05: 0.9 ug/kg/h via INTRAVENOUS
  Administered 2023-07-05: 0.7 ug/kg/h via INTRAVENOUS
  Filled 2023-07-03 (×8): qty 100

## 2023-07-03 MED ORDER — POTASSIUM CHLORIDE 20 MEQ PO PACK
40.0000 meq | PACK | Freq: Once | ORAL | Status: AC
  Administered 2023-07-03: 40 meq
  Filled 2023-07-03: qty 2

## 2023-07-03 MED ORDER — FENTANYL CITRATE PF 50 MCG/ML IJ SOSY: 25.0000 ug | PREFILLED_SYRINGE | INTRAMUSCULAR | Status: DC | PRN

## 2023-07-03 MED ORDER — FENTANYL BOLUS VIA INFUSION
50.0000 ug | INTRAVENOUS | Status: DC | PRN
  Administered 2023-07-03: 50 ug via INTRAVENOUS

## 2023-07-03 MED ORDER — FUROSEMIDE 10 MG/ML IJ SOLN
40.0000 mg | Freq: Once | INTRAMUSCULAR | Status: AC
  Administered 2023-07-03: 40 mg via INTRAVENOUS
  Filled 2023-07-03: qty 4

## 2023-07-03 MED ORDER — POLYETHYLENE GLYCOL 3350 17 G PO PACK: 17.0000 g | PACK | Freq: Every day | ORAL | Status: DC

## 2023-07-03 MED ORDER — DEXMEDETOMIDINE HCL IN NACL 400 MCG/100ML IV SOLN
INTRAVENOUS | Status: AC
  Filled 2023-07-03: qty 100

## 2023-07-03 MED ORDER — DOCUSATE SODIUM 50 MG/5ML PO LIQD
100.0000 mg | Freq: Two times a day (BID) | ORAL | Status: DC
  Administered 2023-07-03 – 2023-07-04 (×3): 100 mg
  Filled 2023-07-03 (×3): qty 10

## 2023-07-03 MED ORDER — GUAIFENESIN 100 MG/5ML PO LIQD
15.0000 mL | Freq: Four times a day (QID) | ORAL | Status: DC
  Administered 2023-07-03 – 2023-07-08 (×21): 15 mL
  Filled 2023-07-03 (×21): qty 15

## 2023-07-03 MED ORDER — FENTANYL CITRATE PF 50 MCG/ML IJ SOSY
25.0000 ug | PREFILLED_SYRINGE | INTRAMUSCULAR | Status: DC | PRN
  Administered 2023-07-03 (×4): 100 ug via INTRAVENOUS
  Administered 2023-07-03: 50 ug via INTRAVENOUS
  Administered 2023-07-03: 100 ug via INTRAVENOUS
  Filled 2023-07-03 (×6): qty 2

## 2023-07-03 MED ORDER — SODIUM CHLORIDE 3 % IN NEBU
4.0000 mL | INHALATION_SOLUTION | Freq: Two times a day (BID) | RESPIRATORY_TRACT | Status: AC
  Administered 2023-07-03 – 2023-07-06 (×6): 4 mL via RESPIRATORY_TRACT
  Filled 2023-07-03 (×6): qty 15

## 2023-07-03 NOTE — Progress Notes (Addendum)
NAME:  Casey [name], MRN:  [id number], DOB:  Apr 29, 1961, LOS: 7 ADMISSION DATE:  06/25/2023 CONSULTATION DATE:  06/26/2023 REFERRING MD:  Lyn Hollingshead - TRH ([facility]) CHIEF COMPLAINT:  AMS, SOB  History of Present Illness:  62 year old woman who presented to [facility] 8/17 as a transfer from [facility] ICU for AMS, hypoxia requiring BiPAP and possible aspiration PNA. PMHx significant for chronic pain, anxiety/depression, panic disorder, suicide attempt, polysubstance abuse.  Patient initially presented to [facility] ED 8/16 after being found at home unresponsive by family. EMS was called, administered Narcan 2mg  IN with improvement in mental status. Per chart review "possible took two Xanax that weren't Xanax". Patient initially refused transport to hospital but eventually agreed. Drowsy with garbled speech with EMS but able to follow commands. Hypoxic to 86% on RA, initially placed on [place] but escalated to NRB with improvement in SpO2 to 96%. In ED, patient was hypothermic, tachycardic to 100s, BP 139/95, RR 38, SpO2 89% on NRB. Labs were notable for WBC 10.6, Hgb 13.2, Plt 414. Na 136, K 3.5, CO2 22, Cr 0.76, LFTs WNL. UA unremarkable. Ethanol and APAP levels negative. UDS+ cocaine, BZD and THC. CXR demonstrated asymmetric bibasilar interstitial and airspace infiltrates c/f multifocal PNA/aspiration. Unasyn ordered in ED but changed to ceftriaxone due to ?allergy. Patient was subsequently admitted to [facility].  On 8/17, patient was noted to have persistent respiratory distress with escalating O2 requirements to HFNC and eventually BiPAP. CXR appeared worse with rapid progression of diffuse bilateral heterogeneous consolidative airspace disease in LUL and R lung, c/w worsened multifocal PNA and small layering R pleural effusion.   PCCM consulted for transfer to [facility] for further care.  Pertinent Medical History:   Past Medical History:  Diagnosis Date   Anxiety    Chronic back pain    Depression    History of panic attacks  09/11/2014   Medical history non-contributory    Polysubstance abuse (HCC)    History of taking non-prescribed opiates, BZDs; also +cocaine and THC   Ruptured disc, thoracic    Suicide attempt (HCC) 2002   Significant Hospital Events: Including procedures, antibiotic start and stop dates in addition to other pertinent events   8/16 - Presented to [facility] via EMS for AMS, hypoxia. Presumed drug intoxication. UDS +cocaine, BZDs, THC. CXR with multifocal PNA. Required [place] then NRB then HFNC. Empiric ceftriaxone. 8/17 - Worsening respiratory status requiring escalation to BiPAP. ABG poor. CXR worse. Transferred to [facility] for further care. 8/18: Opt-flow 8/20: placed on BiPAP overnight. Continued labored breathing in AM, subsequently intubated. Switched abx to ceftriaxone and vancomycin. Dc'd steroids. 8/21: continued agitation on precedex/fentanyl. Started on propofol gtt. Started on seroquel 8/22: DC antibiotics. off precedex. Weaning prop/fent 8/24 increased Seroquel.  Reattempting Precedex trial.  Was apneic on propofol and fentanyl infusion  Interim History / Subjective:  Fairly heavily sedated.  When she does wake up she coughs, briefly gets tachypneic, then settles.  When left unstimulated triggers apnea alarm on current propofol and fentanyl settings  Objective:  Blood pressure (!) 93/55, pulse 83, temperature 99.2 F (37.3 C), temperature source Oral, resp. rate (!) 26, height 5\' 5"  (1.651 m), weight 67.7 kg, SpO2 99%.    Vent Mode: PRVC FiO2 (%):  [40 %] 40 % Set Rate:  [26 bmp] 26 bmp Vt Set:  [450 mL] 450 mL PEEP:  [5 cmH20] 5 cmH20 Plateau Pressure:  [20 cmH20-23 cmH20] 20 cmH20   Intake/Output Summary (Last 24 hours) at 07/03/2023 4098 Last data filed  at 07/03/2023 0500 Gross per 24 hour  Intake 1866.86 ml  Output 1225 ml  Net 641.86 ml   Filed Weights   07/01/23 0500 07/02/23 0500 07/03/23 0500  Weight: 67.9 kg 67.9 kg 67.7 kg   Physical Examination: General Chronically  ill-appearing 62 year old female who appears older than stated age she remains ventilatory dependent HEENT normocephalic atraumatic orally intubated no clear JVD Pulmonary: Coarse scattered rhonchi with occasional scattered wheezing bilaterally.  Easily tachypneic.  Triggered apnea alarm when attempted SBT on current level of sedation Portable chest x-ray personally reviewed from the 22nd shows right greater than left airspace disease with improvement in aeration Cardiac: Regular rate and rhythm without murmur rub or gallop Abdomen: Soft nontender no organomegaly has a nasal feeding tube in place GU clear yellow Extremities warm dry Neuro: Heavily sedated.  Will open her eyes to voice, moves all extremities, no clear focal deficits.  Resolved Hospital Problem List:  Multifocal PNA, presumed aspiration Leukocytosis Assessment & Plan:  Acute hypoxemic respiratory failure in the setting of multifocal PNA (presumed aspiration) -on-going VDRF seemingly a mix of agitation and possibly deconditioning.  Plan Repeating chest x-ray today, nursing reports increased secretions Continuing full ventilator support with daily attempts for SBT PAD protocol, RASS goal -1 (see below) Continued scheduled bronchodilators Repeat Lasix May end up needing tracheostomy if fails extubation attempt, her delirium seems to be a major obstacle here Continue to watch fever and white blood cell curve, antibiotics discontinued on the 22nd  Pleural effusion, small, on right side. Plan Repeat chest x-ray Consider ultrasound point-of-care evaluation at bedside  Acute metabolic encephalopathy secondary to sepsis, and prolonged critical illness complicated further by GAD w/ h/o panic disorder, depression, Chronic pain c/b Polysubstance abuse -UDS +cocaine, BZDs, THC. History of taking non-prescribed opiates/BZDs per family (daily xanax off street) Plan Continuing Celexa, Seroquel, and clonazepam as well as scheduled  oxycodone,  There is room on Seroquel, we will increase this to 100 twice daily We will attempt again to transition to Precedex with as needed fentanyl Try to get of her off propofol as she is apneic when not stimulated Monitor for signs of withdrawal Maximize nonopioid adjuncts Cessation education when appropriate    Best Practice: (right click and "Reselect all SmartList Selections" daily)   Diet/type: tubefeeds per RD  DVT prophylaxis: prophylactic heparin  GI prophylaxis: PPI Lines: PICC Foley:  Yes, and it is still needed Code Status:  full code Last date of multidisciplinary goals of care discussion: with  patient.   My critical care time is 32 minutes  Shelby Mattocks, NP 07/03/2023, 7:19 AM

## 2023-07-03 NOTE — Progress Notes (Signed)
eLink Physician-Brief Progress Note Patient Name: Casey [name] DOB: 07/24/1961 MRN: [id number]   Date of Service  07/03/2023  HPI/Events of Note  Despite multiple PRN fentanyl and versed pushes and even after giving night time meds, patient is agitated and restless. RN asked for a fentanyl drip.   eICU Interventions  Fentanyl drip ordered, seen on camera.      Intervention Category Major Interventions: Respiratory failure - evaluation and management  Oretha Milch 07/03/2023, 10:39 PM

## 2023-07-03 NOTE — Progress Notes (Signed)
Attempted pt on SBT 12/5 40%. Pt with increased RR to the mid 40's and diaphoretic. After approximately 5 minutes, pt still unable to slow RR, so placed back on prior settings.

## 2023-07-03 NOTE — Progress Notes (Signed)
Failed SBT. Was extremely labored, tachypneic, diaphoretic with marked accessory use even on pressure support of 15 therefore weaning attempt discontinued.  There may have been a component of anxiety, however I do think deconditioning playing a significant role Will likely need trach and prolonged wean

## 2023-07-03 NOTE — Progress Notes (Signed)
Pcxr reviewed  On-going R>L airspace disease RT and RN report on-going thick secretions which are yellow and blood tinged.  She had low grade fever last night, WBC ct nml Plan Got lasix earlier Repeat Sputum sent Cont pulm hygiene measures Will add HT saline neb as well x 3d Repeat am cxr Re-attempt wean in am but I suspect will need trach   Simonne Martinet ACNP-BC [facility] Pulmonary/Critical Care Pager # [phone] OR # [phone] if no answer

## 2023-07-04 ENCOUNTER — Inpatient Hospital Stay (HOSPITAL_COMMUNITY): Payer: MEDICAID

## 2023-07-04 DIAGNOSIS — J9601 Acute respiratory failure with hypoxia: Secondary | ICD-10-CM | POA: Diagnosis not present

## 2023-07-04 DIAGNOSIS — F1910 Other psychoactive substance abuse, uncomplicated: Secondary | ICD-10-CM | POA: Diagnosis not present

## 2023-07-04 DIAGNOSIS — F411 Generalized anxiety disorder: Secondary | ICD-10-CM | POA: Diagnosis not present

## 2023-07-04 DIAGNOSIS — G9341 Metabolic encephalopathy: Secondary | ICD-10-CM | POA: Diagnosis not present

## 2023-07-04 LAB — GLUCOSE, CAPILLARY
Glucose-Capillary: 113 mg/dL — ABNORMAL HIGH (ref 70–99)
Glucose-Capillary: 124 mg/dL — ABNORMAL HIGH (ref 70–99)
Glucose-Capillary: 128 mg/dL — ABNORMAL HIGH (ref 70–99)
Glucose-Capillary: 133 mg/dL — ABNORMAL HIGH (ref 70–99)
Glucose-Capillary: 135 mg/dL — ABNORMAL HIGH (ref 70–99)
Glucose-Capillary: 91 mg/dL (ref 70–99)

## 2023-07-04 LAB — CBC
HCT: 27.1 % — ABNORMAL LOW (ref 36.0–46.0)
Hemoglobin: 8.7 g/dL — ABNORMAL LOW (ref 12.0–15.0)
MCH: 27.6 pg (ref 26.0–34.0)
MCHC: 32.1 g/dL (ref 30.0–36.0)
MCV: 86 fL (ref 80.0–100.0)
Platelets: 254 10*3/uL (ref 150–400)
RBC: 3.15 MIL/uL — ABNORMAL LOW (ref 3.87–5.11)
RDW: 14.7 % (ref 11.5–15.5)
WBC: 10.7 10*3/uL — ABNORMAL HIGH (ref 4.0–10.5)
nRBC: 0 % (ref 0.0–0.2)

## 2023-07-04 LAB — COMPREHENSIVE METABOLIC PANEL
ALT: 16 U/L (ref 0–44)
AST: 12 U/L — ABNORMAL LOW (ref 15–41)
Albumin: 2 g/dL — ABNORMAL LOW (ref 3.5–5.0)
Alkaline Phosphatase: 67 U/L (ref 38–126)
Anion gap: 9 (ref 5–15)
BUN: 14 mg/dL (ref 8–23)
CO2: 27 mmol/L (ref 22–32)
Calcium: 7.8 mg/dL — ABNORMAL LOW (ref 8.9–10.3)
Chloride: 101 mmol/L (ref 98–111)
Creatinine, Ser: 0.39 mg/dL — ABNORMAL LOW (ref 0.44–1.00)
GFR, Estimated: >60 mL/min (ref >60)
Glucose, Bld: 165 mg/dL — ABNORMAL HIGH (ref 70–99)
Potassium: 3.7 mmol/L (ref 3.5–5.1)
Sodium: 137 mmol/L (ref 135–145)
Total Bilirubin: 0.2 mg/dL — ABNORMAL LOW (ref 0.3–1.2)
Total Protein: 5.7 g/dL — ABNORMAL LOW (ref 6.5–8.1)

## 2023-07-04 LAB — PROCALCITONIN: Procalcitonin: 0.26 ng/mL

## 2023-07-04 MED ORDER — POTASSIUM CHLORIDE 20 MEQ PO PACK: 40.0000 meq | PACK | Freq: Once | ORAL | Status: DC

## 2023-07-04 MED ORDER — POTASSIUM CHLORIDE 20 MEQ PO PACK
60.0000 meq | PACK | Freq: Once | ORAL | Status: AC
  Administered 2023-07-04: 60 meq
  Filled 2023-07-04: qty 3

## 2023-07-04 MED ORDER — FUROSEMIDE 10 MG/ML IJ SOLN
40.0000 mg | Freq: Once | INTRAMUSCULAR | Status: AC
  Administered 2023-07-04: 40 mg via INTRAVENOUS
  Filled 2023-07-04: qty 4

## 2023-07-04 NOTE — Progress Notes (Signed)
eLink Physician-Brief Progress Note Patient Name: TRICA MCELREATH DOB: Dec 30, 1960 MRN: [id number]   Date of Service  07/04/2023  HPI/Events of Note  35F with AMS (presumed drug intoxication, UDS +cocaine/BZD/THC) and multifocal PNA, c/f aspiration.Required intubation, now extubated but remains tenuous with persistent tachypnea, cough with shallow breathing and overall weakness.  High risk for reintubation.  Currently on tube feeds via nasogastric tube.  Has been tachypneic throughout the day and sustaining oxygenation 96%.  On observation, able to clear her throat, produces weak cough, opening eyes and moving extremities.  Appears weak.   eICU Interventions  Increase flow rate of high flow nasal cannula to 55 L.  Continue airway clearance therapy.  Continue as needed albuterol.  No indication for ABG at this time  Hold tube feeds for now.  No indication to escalate invasive mechanical ventilation at this time.  Agree with high risk for reintubation.     Intervention Category Major Interventions: Respiratory failure - evaluation and management  Shemuel Harkleroad 07/04/2023, 10:47 PM

## 2023-07-04 NOTE — Procedures (Signed)
Extubation Procedure Note  Patient Details:   Name: Casey [name] DOB: 10-22-1961 MRN: [id number]   Airway Documentation:    Vent end date: (not recorded) Vent end time: (not recorded)   Evaluation  O2 sats: stable throughout Complications: No apparent complications Patient did tolerate procedure well. Bilateral Breath Sounds: Diminished   Yes  Leafy Ro 07/04/2023, 8:53 AM  Pt extubated without complication to HHFNC 25L 40%; +cuff leak noted; no stridor post extubation

## 2023-07-04 NOTE — Progress Notes (Signed)
NAME:  Casey [name], MRN:  [id number], DOB:  Aug 16, 1961, LOS: 8 ADMISSION DATE:  06/25/2023 CONSULTATION DATE:  06/26/2023 REFERRING MD:  Lyn Hollingshead - TRH ([facility]) CHIEF COMPLAINT:  AMS, SOB  History of Present Illness:  62 year old woman who presented to [facility] 8/17 as a transfer from [facility] ICU for AMS, hypoxia requiring BiPAP and possible aspiration PNA. PMHx significant for chronic pain, anxiety/depression, panic disorder, suicide attempt, polysubstance abuse.  Patient initially presented to [facility] ED 8/16 after being found at home unresponsive by family. EMS was called, administered Narcan 2mg  IN with improvement in mental status. Per chart review "possible took two Xanax that weren't Xanax". Patient initially refused transport to hospital but eventually agreed. Drowsy with garbled speech with EMS but able to follow commands. Hypoxic to 86% on RA, initially placed on [place] but escalated to NRB with improvement in SpO2 to 96%. In ED, patient was hypothermic, tachycardic to 100s, BP 139/95, RR 38, SpO2 89% on NRB. Labs were notable for WBC 10.6, Hgb 13.2, Plt 414. Na 136, K 3.5, CO2 22, Cr 0.76, LFTs WNL. UA unremarkable. Ethanol and APAP levels negative. UDS+ cocaine, BZD and THC. CXR demonstrated asymmetric bibasilar interstitial and airspace infiltrates c/f multifocal PNA/aspiration. Unasyn ordered in ED but changed to ceftriaxone due to ?allergy. Patient was subsequently admitted to [facility].  On 8/17, patient was noted to have persistent respiratory distress with escalating O2 requirements to HFNC and eventually BiPAP. CXR appeared worse with rapid progression of diffuse bilateral heterogeneous consolidative airspace disease in LUL and R lung, c/w worsened multifocal PNA and small layering R pleural effusion.   PCCM consulted for transfer to [facility] for further care.  Pertinent Medical History:   Past Medical History:  Diagnosis Date   Anxiety    Chronic back pain    Depression    History of panic attacks  09/11/2014   Medical history non-contributory    Polysubstance abuse (HCC)    History of taking non-prescribed opiates, BZDs; also +cocaine and THC   Ruptured disc, thoracic    Suicide attempt (HCC) 2002   Significant Hospital Events: Including procedures, antibiotic start and stop dates in addition to other pertinent events   8/16 - Presented to [facility] via EMS for AMS, hypoxia. Presumed drug intoxication. UDS +cocaine, BZDs, THC. CXR with multifocal PNA. Required [place] then NRB then HFNC. Empiric ceftriaxone. 8/17 - Worsening respiratory status requiring escalation to BiPAP. ABG poor. CXR worse. Transferred to [facility] for further care. 8/18: Opt-flow 8/20: placed on BiPAP overnight. Continued labored breathing in AM, subsequently intubated. Switched abx to ceftriaxone and vancomycin. Dc'd steroids. 8/21: continued agitation on precedex/fentanyl. Started on propofol gtt. Started on seroquel 8/22: DC antibiotics. off precedex. Weaning prop/fent 8/24 increased Seroquel.  Reattempting Precedex trial.  Was apneic on propofol and fentanyl infusion 8/25 on fent overnight in addition to precedex. Today much better after seroquel adjustment and diuresis. Favorable SBI so extubated to heated high flow.   Interim History / Subjective:  Much better today. Good diuresis yesterday. She is able to communicate. Indicates the tube makes her anxious but feels like she is able to breath well. Wants the tube out  Objective:  Blood pressure 104/61, pulse 73, temperature 98.4 F (36.9 C), temperature source Axillary, resp. rate (!) 26, height 5\' 5"  (1.651 m), weight 67.7 kg, SpO2 98%.    Vent Mode: PRVC FiO2 (%):  [40 %] 40 % Set Rate:  [26 bmp] 26 bmp Vt Set:  [450 mL] 450 mL PEEP:  [  5 cmH20] 5 cmH20 Plateau Pressure:  [19 cmH20-23 cmH20] 19 cmH20   Intake/Output Summary (Last 24 hours) at 07/04/2023 0752 Last data filed at 07/04/2023 0700 Gross per 24 hour  Intake 2238.55 ml  Output 3278 ml  Net -1039.45 ml    Filed Weights   07/02/23 0500 07/03/23 0500 07/04/23 0500  Weight: 67.9 kg 67.7 kg 67.7 kg   Physical Examination:  General overall more calm. Gets anxious but able to re-direct today  HENT NCAT orally intubated. No JVD Pulm clear today w/ occ rhonchi that clear w/ cough. On SBT SBI <110. VTs in 300s RR low 30s when calm and redirected. Her cough is strong can hear audible leak past cough  Card rrr Abd soft Ext warm and dry no edema Neuro awake interactive, anxious but can be re-directed moves all ext   Resolved Hospital Problem List:  Multifocal PNA, presumed aspiration Leukocytosis Assessment & Plan:  Acute hypoxemic respiratory failure in the setting of multifocal PNA (presumed aspiration) -on-going VDRF seemingly a mix of agitation, deconditioning and volume overload. We adjusted up her seroquel yesterday, also gave lasix, she's till overall positive but was -1 liter yesterday. Her SBI is <110, I do not see the accessory use or agitation I saw yesterday She tells me she wants the tube out Plan Repeating chest x-ray today Will give another dose of lasix Dc fent Extubate to heated high flow. I do not think she will do as well w/ BIPAP given anxiety  Cont to watch WBC and fever curve and follow cultures sent form 8/24, off abx since 22nd  Pleural effusion, small, on right side. Plan Awaiting f/u cxr. Was more edema than effusion on 8/24  Acute metabolic encephalopathy secondary to sepsis, and prolonged critical illness complicated further by GAD w/ h/o panic disorder, depression, Chronic pain c/b Polysubstance abuse -UDS +cocaine, BZDs, THC. History of taking non-prescribed opiates/BZDs per family (daily xanax off street) Plan Continuing Celexa, Seroquel, and clonazepam as well as scheduled oxycodone, I increased the seroquel on 8/24 the oxy and clonazepam will need slow prolonged taper  Cont precedex thru extubation  Dc fent gtt Maximize nonopioid adjuncts Cessation  education when appropriate    Best Practice: (right click and "Reselect all SmartList Selections" daily)   Diet/type: tubefeeds per RD  DVT prophylaxis: prophylactic heparin  GI prophylaxis: PPI Lines: PICC Foley:  Yes, and it is still needed Code Status:  full code Last date of multidisciplinary goals of care discussion: with  patient.   My critical care time is 67 minutes   Shelby Mattocks, NP 07/04/2023, 7:52 AM

## 2023-07-04 NOTE — Progress Notes (Signed)
Still on high flow oxygen but looks comfortable Cough stronger than anticipated Plan Cont pulm hygiene  Cont ICU monitoring and supportive care

## 2023-07-05 DIAGNOSIS — E440 Moderate protein-calorie malnutrition: Secondary | ICD-10-CM | POA: Diagnosis not present

## 2023-07-05 DIAGNOSIS — J690 Pneumonitis due to inhalation of food and vomit: Secondary | ICD-10-CM | POA: Diagnosis not present

## 2023-07-05 DIAGNOSIS — G9341 Metabolic encephalopathy: Secondary | ICD-10-CM | POA: Diagnosis not present

## 2023-07-05 DIAGNOSIS — J9601 Acute respiratory failure with hypoxia: Secondary | ICD-10-CM | POA: Diagnosis not present

## 2023-07-05 DIAGNOSIS — R112 Nausea with vomiting, unspecified: Secondary | ICD-10-CM

## 2023-07-05 LAB — GLUCOSE, CAPILLARY
Glucose-Capillary: 102 mg/dL — ABNORMAL HIGH (ref 70–99)
Glucose-Capillary: 107 mg/dL — ABNORMAL HIGH (ref 70–99)
Glucose-Capillary: 113 mg/dL — ABNORMAL HIGH (ref 70–99)
Glucose-Capillary: 114 mg/dL — ABNORMAL HIGH (ref 70–99)
Glucose-Capillary: 134 mg/dL — ABNORMAL HIGH (ref 70–99)
Glucose-Capillary: 135 mg/dL — ABNORMAL HIGH (ref 70–99)
Glucose-Capillary: 136 mg/dL — ABNORMAL HIGH (ref 70–99)

## 2023-07-05 LAB — CBC
HCT: 30.1 % — ABNORMAL LOW (ref 36.0–46.0)
Hemoglobin: 9.8 g/dL — ABNORMAL LOW (ref 12.0–15.0)
MCH: 27.2 pg (ref 26.0–34.0)
MCHC: 32.6 g/dL (ref 30.0–36.0)
MCV: 83.6 fL (ref 80.0–100.0)
Platelets: 358 10*3/uL (ref 150–400)
RBC: 3.6 MIL/uL — ABNORMAL LOW (ref 3.87–5.11)
RDW: 14.4 % (ref 11.5–15.5)
WBC: 18.5 10*3/uL — ABNORMAL HIGH (ref 4.0–10.5)
nRBC: 0 % (ref 0.0–0.2)

## 2023-07-05 LAB — BASIC METABOLIC PANEL
Anion gap: 8 (ref 5–15)
BUN: 14 mg/dL (ref 8–23)
CO2: 26 mmol/L (ref 22–32)
Calcium: 8.4 mg/dL — ABNORMAL LOW (ref 8.9–10.3)
Chloride: 101 mmol/L (ref 98–111)
Creatinine, Ser: 0.71 mg/dL (ref 0.44–1.00)
GFR, Estimated: >60 mL/min (ref >60)
Glucose, Bld: 118 mg/dL — ABNORMAL HIGH (ref 70–99)
Potassium: 4 mmol/L (ref 3.5–5.1)
Sodium: 135 mmol/L (ref 135–145)

## 2023-07-05 LAB — CULTURE, RESPIRATORY W GRAM STAIN

## 2023-07-05 LAB — MAGNESIUM: Magnesium: 2.1 mg/dL (ref 1.7–2.4)

## 2023-07-05 MED ORDER — POLYETHYLENE GLYCOL 3350 17 G PO PACK: 17.0000 g | PACK | Freq: Every day | ORAL | Status: DC

## 2023-07-05 MED ORDER — INSULIN ASPART 100 UNIT/ML IJ SOLN
1.0000 [IU] | INTRAMUSCULAR | Status: DC
  Administered 2023-07-05 – 2023-07-06 (×4): 1 [IU] via SUBCUTANEOUS

## 2023-07-05 MED ORDER — SENNOSIDES 8.8 MG/5ML PO SYRP: 10.0000 mL | ORAL_SOLUTION | Freq: Every day | ORAL | Status: DC

## 2023-07-05 MED ORDER — LOPERAMIDE HCL 1 MG/7.5ML PO SUSP
4.0000 mg | Freq: Once | ORAL | Status: AC
  Administered 2023-07-05: 4 mg
  Filled 2023-07-05: qty 30

## 2023-07-05 MED ORDER — OSMOLITE 1.5 CAL PO LIQD
1000.0000 mL | ORAL | Status: DC
  Administered 2023-07-05 – 2023-07-10 (×3): 1000 mL
  Filled 2023-07-05 (×4): qty 1000

## 2023-07-05 MED ORDER — BANATROL TF EN LIQD
60.0000 mL | Freq: Two times a day (BID) | ENTERAL | Status: DC
  Administered 2023-07-05 – 2023-07-10 (×11): 60 mL
  Filled 2023-07-05 (×13): qty 60

## 2023-07-05 MED ORDER — FUROSEMIDE 10 MG/ML IJ SOLN
40.0000 mg | Freq: Once | INTRAMUSCULAR | Status: AC
  Administered 2023-07-05: 40 mg via INTRAVENOUS
  Filled 2023-07-05: qty 4

## 2023-07-05 NOTE — Progress Notes (Signed)
Inpatient Rehab Admissions Coordinator:   Pt was screened for CIR per ?PT recommendations?  Current documentation appears to be in favor of SNF rehab.  Limited evaluation due to abdominal pain.  Note still requiring significant supplemental O2 as well and high risk for reintubation.  We will follow from a distance for progress/therapy.   Estill Dooms, PT, DPT Admissions Coordinator [phone] 07/05/23  5:06 PM

## 2023-07-05 NOTE — Progress Notes (Signed)
Nutrition Follow-up  DOCUMENTATION CODES:   Non-severe (moderate) malnutrition in context of social or environmental circumstances  INTERVENTION:   Resume continuous enteral nutrition via Cortrak tube: - Osmolite 1.5 @ 50 ml/hr (1200 ml/day) - PROSource TF20 60 ml daily  Tube feeding regimen provides 1880 kcal, 95 grams of protein, and 914 ml of H2O.  - Continue MVI with minerals daily per tube  - Add Banatrol TF BID per tube  NUTRITION DIAGNOSIS:   Moderate Malnutrition related to social / environmental circumstances (polysubstance abuse) as evidenced by mild fat depletion, mild muscle depletion.  Ongoing, being addressed via enteral nutrition  GOAL:   Patient will meet greater than or equal to 90% of their needs  Met via enteral nutrition  MONITOR:   PO intake, Labs, Weight trends, TF tolerance, I & O's  REASON FOR ASSESSMENT:   Consult Enteral/tube feeding initiation and management  ASSESSMENT:   62 year old female who presented to the ED on 8/16 after being found unresponsive by family. PMH of anxiety, chronic pain, depression, suicide attempt, polysubstance abuse. Pt admitted with acute hypoxemic respiratory failure in the setting of aspiration pneumonia, acute metabolic encephalopathy.  8/17 - transfer to [facility], Heart healthy diet, later NPO 8/18 - SLP recommending NPO 8/19 - Heart Healthy diet, later NPO 8/20 - intubated 8/21 - Cortrak placed (tip gastric) 8/25 - extubated, TF d/c due to concern for reintubation 8/26 - s/p FEES, diet advanced to regular/nectar-thick liquids, consult received to resume continuous tube feeds  Discussed pt with RN and during ICU rounds. Consult received for enteral nutrition initiation and management. Pt had FEES this morning and diet was advanced to regular with nectar-thick liquids. MD feels that pt's deconditioning precludes her from successfully advancing her diet for now and requests RD resume continuous tube feeds. Per MD,  pt can have food if she is actively asking for something to eat.  RN reports pt having bowel movements constantly. Will add Banatrol TF BID per tube; can increase to TID tomorrow as needed.  Admit weight: 61 kg Current weight: 62 kg  Medications reviewed and include: IV lasix 40 mg x 1, MVI with minerals, miralax, senokot  Labs reviewed: WBC 18.5, hemoglobin 9.8 CBG's: 91-135 x 24 hours  UOP: 4450 ml x 24 hours I/O's: -125 ml since admit  Diet Order:   Diet Order             Diet regular Fluid consistency: Nectar Thick  Diet effective now                   EDUCATION NEEDS:   Not appropriate for education at this time  Skin:  Skin Assessment: Reviewed RN Assessment  Last BM:  07/05/23 type 6  Height:   Ht Readings from Last 1 Encounters:  06/29/23 5\' 5"  (1.651 m)    Weight:   Wt Readings from Last 1 Encounters:  07/05/23 62 kg    Ideal Body Weight:  56.8 kg  BMI:  Body mass index is 22.75 kg/m.  Estimated Nutritional Needs:   Kcal:  1700-1900  Protein:  85-100 grams  Fluid:  1.7-1.9 L    Mertie Clause, MS, RD, LDN Registered Dietitian II Please see AMiON for contact information.

## 2023-07-05 NOTE — Progress Notes (Signed)
Patient seen and reexamined at bedside this afternoon.  Still remains shallow breathing and tachypnea.  Still able to answer questions.  She says she does not want to be reintubated but wants to go home.  Was able to get her to cough on command, progressively cough got stronger with more ability to clear secretions.  With aid and RN was able to sit her up in bed.  Attempted NTS was unsuccessful but triggered strong coughing which she was able to orally clear secretions.  Respiratory rate improved.  No urgent indications for intubation at this time but remains a very high risk for reintubation due to overall poor breathing mechanics.  Her posture will significantly influence this.  Adding NTS as needed orders, already has ordered chest PT. if she develops progressive respiratory decline would recommend reintubation, which I discussed with her.  Reasonable to try BiPAP during sleep tonight.   Steffanie Dunn, DO 07/05/23 4:13 PM [facility]  For contact information, see Amion. If no response to pager, please call PCCM consult pager. After hours, 7PM- 7AM, please call Elink.

## 2023-07-05 NOTE — TOC Progression Note (Signed)
Transition of Care [facility]) - Progression Note    Patient Details  Name: HALIMA DARRIGO MRN: [id number] Date of Birth: Dec 09, 1960  [facility]) CM/SW Contact  Tom-Johnson, Hershal Coria, RN Phone Number: 07/05/2023, 3:07 PM  Clinical Narrative:     Patient extubated yesterday 07/04/23, currently on HHFNC 50L. On IV lasix. Completed abx course. On Tube feedings, has foley cath.  Awaits PT/OT eval for disposition.   CM will continue to follow as patient progresses with care towards discharge.        Barriers to Discharge: Continued Medical Work up  Expected Discharge Plan and Services         Expected Discharge Date: 06/26/23                                     Social Determinants of Health (SDOH) Interventions SDOH Screenings   Depression (PHQ2-9): Medium Risk (02/24/2023)  Tobacco Use: High Risk (05/28/2023)   Received from [facility]    Readmission Risk Interventions     No data to display

## 2023-07-05 NOTE — Evaluation (Signed)
Physical Therapy Evaluation Patient Details Name: Casey [name] MRN: [id number] DOB: [birth year] Today's Date: 07/05/2023  History of Present Illness  Pt is 62 yo presenting to [facility] 8/17 as transfer from [facility] ICU for AMS, hypoxia requiring BiPAP and possible aspiration PNA. Intubated on 8/20 due to continued labored breathing. Pt was extubated on 8/25 to [facility] and down 8/26 HFNC. PMH: chronic pain, anxiety/depression, panic disorder, suicide attempt, polysubstance abuse.  Clinical Impression  Pt is presenting below baseline level of functioning. Prior to hospitalization pt reports that she was independent with all mobility. Currently pt is CGA for rolling to the R and Mod A for rolling to the L. Pt was unable to progress to OOB activities due to severe abdominal pain and vomiting. At this time due to pt current functional status, home set up and available assistance at home recommending skilled physical therapy services <3 hours/day at a higher level of care and frequency in order to return to age related activities and PLOF with decreased risk for falls, injury, immobility and re-hospitalization.         If plan is discharge home, recommend the following: A lot of help with walking and/or transfers;Assistance with cooking/housework;Help with stairs or ramp for entrance;Assist for transportation   Can travel by private vehicle   No    Equipment Recommendations Other (comment) (to be determined)  Recommendations for Other Services  Rehab consult    Functional Status Assessment Patient has had a recent decline in their functional status and demonstrates the ability to make significant improvements in function in a reasonable and predictable amount of time.     Precautions / Restrictions Precautions Precautions: Fall Restrictions Weight Bearing Restrictions: No      Mobility  Bed Mobility Overal bed mobility: Needs Assistance Bed Mobility: Rolling Rolling: Contact guard assist, Mod  assist         General bed mobility comments: CGA rolling to the R and Mod A rolling to the L with pain    Transfers     General transfer comment: Unable to progress today due to pain         Pertinent Vitals/Pain Pain Assessment Pain Assessment: Faces Faces Pain Scale: Hurts whole lot Facial Expression: Tense Body Movements: Protection Muscle Tension: Tense, rigid Pain Location: abdomen Pain Descriptors / Indicators: Cramping Pain Intervention(s): Limited activity within patient's tolerance    Home Living Family/patient expects to be discharged to:: Private residence Living Arrangements: Children (lives with son) Available Help at Discharge: Family;Available 24 hours/day Type of Home: House Home Access: Stairs to enter   [employer] of Steps: 1   Home Layout: One level Home Equipment: None Additional Comments: son is disabled and `lives with pt    Prior Function Prior Level of Function : Independent/Modified Independent             Mobility Comments: without AD       Extremity/Trunk Assessment   Upper Extremity Assessment Upper Extremity Assessment: Generalized weakness    Lower Extremity Assessment Lower Extremity Assessment: Generalized weakness    Cervical / Trunk Assessment Cervical / Trunk Assessment: Normal  Communication   Communication Communication: Other (comment) (very hoarse soft voice)  Cognition Arousal: Alert Behavior During Therapy: WFL for tasks assessed/performed Overall Cognitive Status: Within Functional Limits for tasks assessed                General Comments General comments (skin integrity, edema, etc.): Pt was agreeable to physical therapy and was on bed pan.  Nursing was assisted with pt rolling and clean up. Pt abdomen started cramping and pt was vomiting. Deferred rest of session due to pain/vomiting.        Assessment/Plan    PT Assessment Patient needs continued PT services  PT Problem List  Decreased mobility;Decreased strength;Decreased activity tolerance;Decreased balance       PT Treatment Interventions DME instruction;Therapeutic exercise;Gait training;Balance training;Stair training;Functional mobility training;Therapeutic activities;Patient/family education    PT Goals (Current goals can be found in the Care Plan section)  Acute Rehab PT Goals Patient Stated Goal: To decrease pain PT Goal Formulation: With patient Time For Goal Achievement: 07/19/23 Potential to Achieve Goals: Fair    Frequency Min 1X/week        AM-PAC PT "6 Clicks" Mobility  Outcome Measure Help needed turning from your back to your side while in a flat bed without using bedrails?: A Lot Help needed moving from lying on your back to sitting on the side of a flat bed without using bedrails?: A Lot Help needed moving to and from a bed to a chair (including a wheelchair)?: A Lot Help needed standing up from a chair using your arms (e.g., wheelchair or bedside chair)?: A Lot Help needed to walk in hospital room?: A Lot Help needed climbing 3-5 steps with a railing? : Total 6 Click Score: 11    End of Session   Activity Tolerance: Patient limited by pain;Other (comment) (vomiting) Patient left: in bed;with nursing/sitter in room;with call bell/phone within reach Nurse Communication: Mobility status PT Visit Diagnosis: Other abnormalities of gait and mobility (R26.89)    Time: 1610-9604 PT Time Calculation (min) (ACUTE ONLY): 13 min   Charges:   PT Evaluation $PT Eval Low Complexity: 1 Low   PT General Charges $$ ACUTE PT VISIT: 1 Visit       Harrel Carina, DPT, CLT  Acute Rehabilitation Services Office: [phone] (Secure chat preferred)   Claudia Desanctis 07/05/2023, 3:36 PM

## 2023-07-05 NOTE — Procedures (Signed)
Objective Swallowing Evaluation: Type of Study: MBS-Modified Barium Swallow Study   Patient Details  Name: Casey [name] MRN: [id number] Date of Birth: 04/29/1961  Today's Date: 07/05/2023 Time: SLP Start Time (ACUTE ONLY): 0900 -SLP Stop Time (ACUTE ONLY): 0923  SLP Time Calculation (min) (ACUTE ONLY): 23 min   Past Medical History:  Past Medical History:  Diagnosis Date   Anxiety    Chronic back pain    Depression    History of panic attacks 09/11/2014   Medical history non-contributory    Polysubstance abuse (HCC)    History of taking non-prescribed opiates, BZDs; also +cocaine and THC   Ruptured disc, thoracic    Suicide attempt (HCC) 2002   Past Surgical History:  Past Surgical History:  Procedure Laterality Date   CESAREAN SECTION     CLAVICLE SURGERY     REVERSE SHOULDER ARTHROPLASTY Left 01/01/2020   Procedure: LEFT REVERSE SHOULDER ARTHROPLASTY;  Surgeon: Cammy Copa, MD;  Location: [facility] OR;  Service: Orthopedics;  Laterality: Left;   TUBAL LIGATION     HPI: Casey [name] is a 62 yo female who presented to [facility] ED 8/16 after being found at home unresponsive. Per note, pt's son administered "at home narcan", which is vinegar, while pt was unresponsive. EMS administered Narcan with improvement in mental status. In ED, was hypothermic, tachycardic, SpO2 89% on NRB. UDS + for cocaine, BZD, and THC. CXR with asymmetric bibasilar interstitial and airspace infiltrates c/f multifocal PNA/aspiration. On 8/17, pt with persistent respiratory distress with escalating O2 requirements to HFNC and eventually BiPAP and worsening CXR with rapid progression of bilateral heterogenous consolidative airspace disease in LUL and R lung. Transferred to [facility] 8/17. Required ETT 8/20-25. Extubated to HHFNC 55L. PMH includes chronic pain, anxiety/depression, panic disorders, suicide attempt, polysubstance abuse   Subjective: Awake, alert, pleasant    Recommendations for follow up therapy are  one component of a multi-disciplinary discharge planning process, led by the attending physician.  Recommendations may be updated based on patient status, additional functional criteria and insurance authorization.  Assessment / Plan / Recommendation     07/05/2023    9:48 AM  Clinical Impressions  Clinical Impression Pt with increased WOB on SLP arrival as compared to BSE.  Pt tachypneic with RR of 45, but eager to proceed with evaluation and did not feel she was having any respiratory distress.  Pt presents with a mild oropharyngeal dysphagia c/b delayed swallow initiation, incomplete laryngeal closure, and diminished sensation.  These deficits resulted in deep static penetration of thin liquid to the level of the vocal folds, with possible unvisualized aspiration, with inconsistent cough reponse.  Cued cough was beneficial to clear penetration.  There was no penetration of nectar thick liquids.  With puree there was shallow penetration on first trial only which cleared with subsequent swallow.  There was no further penetration of puree or regular solid textures.  There is a white excressence with small red area below the L VF which is seemingly consistent with intubation trauma and may contribute to dysphonia.  Pt also with cobblestoning of pharyngeal mucosa suspicous for reflux.  Pt's dysphagia will hopefully resolve post extubation.  Recommmend repeat evaluation for diet advancement by MBS if pt is off HHFNC for pt comfort, but she could repeat FEES if needed.    Recommend regular texture diet with nectar thick liquids.    Pt may have small sips of unthickened water in between meals, after good oral care, when fully awake/alert, with  upright positioning and supervision.    SLP Visit Diagnosis Dysphagia, oropharyngeal phase (R13.12)  Impact on safety and function Moderate aspiration risk         07/05/2023    9:48 AM  Treatment Recommendations  Treatment Recommendations Therapy as outlined  in treatment plan below        07/05/2023    9:57 AM  Prognosis  Prognosis for improved oropharyngeal function Good  Barriers to Reach Goals Cognitive deficits       07/05/2023    9:48 AM  Diet Recommendations  SLP Diet Recommendations Regular solids;Nectar thick liquid  Liquid Administration via Cup;Straw  Medication Administration Whole meds with liquid  Compensations Slow rate;Small sips/bites  Postural Changes Seated upright at 90 degrees         07/05/2023    9:48 AM  Other Recommendations  Oral Care Recommendations Oral care BID  Caregiver Recommendations Avoid jello, ice cream, thin soups, popsicles;Remove water pitcher  Follow Up Recommendations --  Functional Status Assessment Patient has had a recent decline in their functional status and demonstrates the ability to make significant improvements in function in a reasonable and predictable amount of time.       07/05/2023    9:48 AM  Frequency and Duration   Speech Therapy Frequency (ACUTE ONLY) min 2x/week  Treatment Duration 2 weeks         07/05/2023    9:45 AM  Oral Phase  Oral Phase WFL  Oral - Nectar Straw WFL  Oral - Thin Straw Premature spillage  Oral - Puree WFL  Oral - Regular Piecemeal swallowing       07/05/2023    9:46 AM  Pharyngeal Phase  Pharyngeal Phase Impaired  Pharyngeal- Nectar Straw WFL  Pharyngeal Material does not enter airway  Pharyngeal- Thin Straw Delayed swallow initiation-vallecula;Reduced airway/laryngeal closure;Penetration/Aspiration during swallow;Trace aspiration  Pharyngeal Material enters airway, CONTACTS cords and not ejected out  Pharyngeal- Puree Delayed swallow initiation-vallecula  Pharyngeal Material enters airway, remains ABOVE vocal cords then ejected out  Pharyngeal- Regular [facility]  Pharyngeal Material does not enter airway        07/05/2023    9:47 AM  Cervical Esophageal Phase   Cervical Esophageal Phase [facility]     Kerrie Pleasure, MA, CCC-SLP Acute  Rehabilitation Services Office: [phone] 07/05/2023, 9:57 AM

## 2023-07-05 NOTE — Progress Notes (Signed)
Pt had one vomiting episode. Pt vomited about 50 ml of bile/green liquids. 4mg  Zofran given IV. Dr. Chestine Spore notified. TF held.

## 2023-07-05 NOTE — Progress Notes (Signed)
Upon arriving back from floor from procedural area with another patient, I was called into patient's room. Pt was found on the floor laying on her right side. Pt was assisted back into bed with the help of other nursing staff and RT using proper patient handling techniques. Charge RN and attending physician notified immediately and arrived at bedside within 5 minutes of notification. Full assessment completed. Pt A&O x 4. BP 116/71 (84), HR 97, O2 100% on 15L NRB, RR 28. Pt noted to have small bleeding from upper lip and front gum line area. Pt c/o of mild pain in R knee with small abrasion, redness, and mild bruising. Ice packs applied to knee and face. Pt denies need for pain medication & denies pain in any other locations. When asking the patient what happened, she stated that she "fell on her nose." Pt denied headache. Fall mats placed, bed alarm on. Bed locked and in lowest position. Call bell within reach. Erin Hearing, RN (Department Director) notified. Son, Jill Alexanders, notified. No further orders received.

## 2023-07-05 NOTE — Progress Notes (Signed)
NAME:  Casey [name], MRN:  [id number], DOB:  1961/08/20, LOS: 9 ADMISSION DATE:  06/25/2023 CONSULTATION DATE:  06/26/2023 REFERRING MD:  Lyn Hollingshead - TRH ([facility]) CHIEF COMPLAINT:  AMS, SOB  History of Present Illness:  62 year old woman who presented to [facility] 8/17 as a transfer from [facility] ICU for AMS, hypoxia requiring BiPAP and possible aspiration PNA. PMHx significant for chronic pain, anxiety/depression, panic disorder, suicide attempt, polysubstance abuse.  Patient initially presented to [facility] ED 8/16 after being found at home unresponsive by family. EMS was called, administered Narcan 2mg  IN with improvement in mental status. Per chart review "possible took two Xanax that weren't Xanax". Patient initially refused transport to hospital but eventually agreed. Drowsy with garbled speech with EMS but able to follow commands. Hypoxic to 86% on RA, initially placed on [place] but escalated to NRB with improvement in SpO2 to 96%. In ED, patient was hypothermic, tachycardic to 100s, BP 139/95, RR 38, SpO2 89% on NRB. Labs were notable for WBC 10.6, Hgb 13.2, Plt 414. Na 136, K 3.5, CO2 22, Cr 0.76, LFTs WNL. UA unremarkable. Ethanol and APAP levels negative. UDS+ cocaine, BZD and THC. CXR demonstrated asymmetric bibasilar interstitial and airspace infiltrates c/f multifocal PNA/aspiration. Unasyn ordered in ED but changed to ceftriaxone due to ?allergy. Patient was subsequently admitted to [facility].  On 8/17, patient was noted to have persistent respiratory distress with escalating O2 requirements to HFNC and eventually BiPAP. CXR appeared worse with rapid progression of diffuse bilateral heterogeneous consolidative airspace disease in LUL and R lung, c/w worsened multifocal PNA and small layering R pleural effusion.   PCCM consulted for transfer to [facility] for further care.  Pertinent Medical History:   Past Medical History:  Diagnosis Date   Anxiety    Chronic back pain    Depression    History of panic attacks  09/11/2014   Medical history non-contributory    Polysubstance abuse (HCC)    History of taking non-prescribed opiates, BZDs; also +cocaine and THC   Ruptured disc, thoracic    Suicide attempt (HCC) 2002   Significant Hospital Events: Including procedures, antibiotic start and stop dates in addition to other pertinent events   8/16 - Presented to [facility] via EMS for AMS, hypoxia. Presumed drug intoxication. UDS +cocaine, BZDs, THC. CXR with multifocal PNA. Required [place] then NRB then HFNC. Empiric ceftriaxone. 8/17 - Worsening respiratory status requiring escalation to BiPAP. ABG poor. CXR worse. Transferred to [facility] for further care. 8/18: Opt-flow 8/20: placed on BiPAP overnight. Continued labored breathing in AM, subsequently intubated. Switched abx to ceftriaxone and vancomycin. Dc'd steroids. 8/21: continued agitation on precedex/fentanyl. Started on propofol gtt. Started on seroquel 8/22: DC antibiotics. off precedex. Weaning prop/fent 8/24 increased Seroquel.  Reattempting Precedex trial.  Was apneic on propofol and fentanyl infusion 8/25 on fent overnight in addition to precedex. Today much better after seroquel adjustment and diuresis. Favorable SBI so extubated to heated high flow.  8/26 remains on HFNC. On precedex overnight but off in AM. Lasix x1  Interim History / Subjective:  Feels that her breathing is stable on HFNC. Using suction for mucus.  Objective:  Blood pressure 120/71, pulse 92, temperature 98.5 F (36.9 C), temperature source Oral, resp. rate (!) 36, height 5\' 5"  (1.651 m), weight 62 kg, SpO2 95%.    FiO2 (%):  [35 %-40 %] 35 %   Intake/Output Summary (Last 24 hours) at 07/05/2023 1111 Last data filed at 07/05/2023 1000 Gross per 24 hour  Intake 1614.65  ml  Output 4775 ml  Net -3160.35 ml   Filed Weights   07/03/23 0500 07/04/23 0500 07/05/23 0443  Weight: 67.7 kg 67.7 kg 62 kg   Physical Examination:  Gen: NAD sitting in chair, chronically ill, appears weak  and deconditioned HEENT: HFNC CV: RRR no murmurs appreciated Pulm: scattered rhonchi bilaterally, occasional cough. Comfortable WOB on HFNC Neuro: Awake, alert, appropriately responsive. Speaks softly and in short sentences Ext: No significant BLE edema   Resolved Hospital Problem List:   Assessment & Plan:  Acute hypoxemic respiratory failure in the setting of multifocal PNA (presumed aspiration) Leukocytosis Ongoing yet improving respiratory failure in the setting of agitation, deconditioning, and volume overload 2/2 aspiration. Extubated 8/25, now stable on HFNC. Suspect she is high risk for reintubation given her significant deconditioning. Received Lasix 40mg  IV 8/24 and 8/25 with good urine output. Stable CXR 8/25. Leukocytosis to 18.5 today, unclear etiology of this given no recent steroids, afebrile, s/p antibiotic course for PNA.  Plan Continue HFNC wean as tolerated Lasix 40mg  IV x1 today Cont to watch WBC and fever curve and follow cultures sent 8/24, off abx since 22nd - cultures NGTD Appreciate PT/OT/SLP  Acute metabolic encephalopathy secondary to sepsis, and prolonged critical illness complicated further by GAD w/ h/o panic disorder, depression, Chronic pain c/b Polysubstance abuse -UDS +cocaine, BZDs, THC. History of taking non-prescribed opiates/BZDs per family (daily xanax off street) Plan Continuing Celexa, Seroquel, and clonazepam as well as scheduled oxycodone. Seroquel increased to 100mg  bid on 8/24. Oxy and clonazepam will need slow prolonged taper Wean precedex as tolerated - currently off Off fent gtt DC robaxin and PRN versed Maximize nonopioid adjuncts Cessation education when appropriate    Best Practice: (right click and "Reselect all SmartList Selections" daily)   Diet/type: tubefeeds per RD - passed swallow screen per SLP but feel her deconditioning precludes her from advancing her diet for now DVT prophylaxis: prophylactic heparin  GI prophylaxis:  N/A Lines: PICC Foley:  Yes, and it is still needed Code Status:  full code Last date of multidisciplinary goals of care discussion: with  patient.     Vonna Drafts, MD 07/05/2023, 11:11 AM

## 2023-07-05 NOTE — Progress Notes (Signed)
   07/05/23 2307  BiPAP/CPAP/SIPAP  $ Non-Invasive Home Ventilator  Initial  $ Face Mask Medium Yes  BiPAP/CPAP/SIPAP Pt Type Adult  BiPAP/CPAP/SIPAP V60  Mask Type Full face mask  Mask Size Medium  Set Rate 14 breaths/min  Respiratory Rate 28 breaths/min  IPAP 20 cmH20  EPAP 8 cmH2O  FiO2 (%) 40 %  Minute Ventilation 16  Leak 12  Peak Inspiratory Pressure (PIP) 20  Tidal Volume (Vt) 568  Patient Home Equipment No  Auto Titrate No  Press High Alarm 25 cmH2O  Press Low Alarm 5 cmH2O  BiPAP/CPAP /SiPAP Vitals  Resp (!) 28

## 2023-07-05 NOTE — Evaluation (Signed)
Clinical/Bedside Swallow Evaluation Patient Details  Name: Casey [name] MRN: [id number] Date of Birth: [birth year]  Today's Date: 07/05/2023 Time: SLP Start Time (ACUTE ONLY): 0848 SLP Stop Time (ACUTE ONLY): 0854 SLP Time Calculation (min) (ACUTE ONLY): 6 min  Past Medical History:  Past Medical History:  Diagnosis Date   Anxiety    Chronic back pain    Depression    History of panic attacks 09/11/2014   Medical history non-contributory    Polysubstance abuse (HCC)    History of taking non-prescribed opiates, BZDs; also +cocaine and THC   Ruptured disc, thoracic    Suicide attempt (HCC) 2002   Past Surgical History:  Past Surgical History:  Procedure Laterality Date   CESAREAN SECTION     CLAVICLE SURGERY     REVERSE SHOULDER ARTHROPLASTY Left 01/01/2020   Procedure: LEFT REVERSE SHOULDER ARTHROPLASTY;  Surgeon: Cammy Copa, MD;  Location: [facility] OR;  Service: Orthopedics;  Laterality: Left;   TUBAL LIGATION     HPI:  Casey [name] is a 62 yo female who presented to [facility] ED 8/16 after being found at home unresponsive. Per note, pt's son administered "at home narcan", which is vinegar, while pt was unresponsive. EMS administered Narcan with improvement in mental status. In ED, was hypothermic, tachycardic, SpO2 89% on NRB. UDS + for cocaine, BZD, and THC. CXR with asymmetric bibasilar interstitial and airspace infiltrates c/f multifocal PNA/aspiration. On 8/17, pt with persistent respiratory distress with escalating O2 requirements to HFNC and eventually BiPAP and worsening CXR with rapid progression of bilateral heterogenous consolidative airspace disease in LUL and R lung. Transferred to [facility] 8/17. Required ETT 8/20-25. Extubated to HHFNC 55L. PMH includes chronic pain, anxiety/depression, panic disorders, suicide attempt, polysubstance abuse    Assessment / Plan / Recommendation  Clinical Impression  Pt presents with clinical indicators in setting of VDRF requiring 5 day  intubation.  Pt on HHFC at 55L this morning following extubation 8/25.  Vocal quality very hoarse with low vocal intensity.  Pt states this is her baseline, but suspect that is not the case.  Pt tolerated ice chips, but required multiple swallows.  With thin liquid by spoon there was slight we vocal quality.  With straw sips there was wet vocal quality and delayed throat clear.  Pt tolerated puree without s/s of aspiration.  Recommend instrumental assessment prior to initiation of PO diet.  Pt cannot transport to radiology on HHFNC.  Recommend FEES.  Pt is hesitant, but agreeable as she is very motivated for POs.  Will proceed with FEES immediately.   SLP Visit Diagnosis: Dysphagia, unspecified (R13.10)    Aspiration Risk  Moderate aspiration risk    Diet Recommendation NPO    Medication Administration: Via alternative means    Other  Recommendations Oral Care Recommendations: Oral care QID;Oral care prior to ice chip/H20    Recommendations for follow up therapy are one component of a multi-disciplinary discharge planning process, led by the attending physician.  Recommendations may be updated based on patient status, additional functional criteria and insurance authorization.  Follow up Recommendations  (TBD)      Assistance Recommended at Discharge  N/A  Functional Status Assessment Patient has had a recent decline in their functional status and demonstrates the ability to make significant improvements in function in a reasonable and predictable amount of time.  Frequency and Duration  (TBD)          Prognosis Prognosis for improved oropharyngeal function:  (TBD)  Swallow Study   General Date of Onset: 06/26/23 HPI: Casey [name] is a 62 yo female who presented to [facility] ED 8/16 after being found at home unresponsive. Per note, pt's son administered "at home narcan", which is vinegar, while pt was unresponsive. EMS administered Narcan with improvement in mental status. In ED, was  hypothermic, tachycardic, SpO2 89% on NRB. UDS + for cocaine, BZD, and THC. CXR with asymmetric bibasilar interstitial and airspace infiltrates c/f multifocal PNA/aspiration. On 8/17, pt with persistent respiratory distress with escalating O2 requirements to HFNC and eventually BiPAP and worsening CXR with rapid progression of bilateral heterogenous consolidative airspace disease in LUL and R lung. Transferred to [facility] 8/17. Required ETT 8/20-25. Extubated to HHFNC 55L. PMH includes chronic pain, anxiety/depression, panic disorders, suicide attempt, polysubstance abuse Type of Study: Bedside Swallow Evaluation Previous Swallow Assessment: BSE 8/18 prior to intubation Diet Prior to this Study: NPO Temperature Spikes Noted: No Respiratory Status: Nasal cannula (HHFNC 55L) History of Recent Intubation: Yes Total duration of intubation (days): 5 days Date extubated: 07/04/23 Behavior/Cognition: Alert;Cooperative;Pleasant mood Oral Cavity Assessment: Within Functional Limits Oral Care Completed by SLP: No Oral Cavity - Dentition: Adequate natural dentition (Fair condition) Vision: Functional for self-feeding Self-Feeding Abilities: Able to feed self Patient Positioning: Upright in chair Baseline Vocal Quality: Wet;Hoarse;Breathy Volitional Cough: Strong Volitional Swallow: Able to elicit    Oral/Motor/Sensory Function Overall Oral Motor/Sensory Function: Within functional limits Facial ROM: Within Functional Limits Facial Symmetry: Within Functional Limits Lingual ROM: Within Functional Limits (with encouragment) Lingual Symmetry: Within Functional Limits Lingual Strength: Within Functional Limits Velum: Within Functional Limits Mandible: Within Functional Limits   Ice Chips Ice chips: Impaired Presentation: Spoon Pharyngeal Phase Impairments: Multiple swallows   Thin Liquid Thin Liquid: Impaired Presentation: Spoon;Straw Pharyngeal  Phase Impairments: Multiple swallows;Wet Vocal  Quality;Throat Clearing - Delayed    Nectar Thick Nectar Thick Liquid: Not tested   Honey Thick Honey Thick Liquid: Not tested   Puree Puree: Within functional limits Presentation: Self Fed   Solid     Solid: Not tested      Kerrie Pleasure, MA, CCC-SLP Acute Rehabilitation Services Office: [phone] 07/05/2023,9:43 AM

## 2023-07-05 NOTE — Progress Notes (Signed)
After updating Jill Alexanders, son, about patient fall and current status, he did not express any concerns & stated he would be by to visit on 8/27.

## 2023-07-05 NOTE — Progress Notes (Signed)
Held CPT patient vomited and still nauseous.

## 2023-07-05 NOTE — Progress Notes (Signed)
Patient rolled out of bed. Rexamined at bedside. Awake, answering questions.   BP 116/71 (BP Location: Left Arm)   Pulse 97   Temp 98.5 F (36.9 C) (Axillary)   Resp (!) 28   Ht 5\' 5"  (1.651 m)   Wt 62 kg   LMP  (LMP Unknown)   SpO2 100%   BMI 22.75 kg/m  Alert, answering questions appropriately.  Weak voice still but breathing mechanics improved Tachypneic with respiratory rate in high 20s, deeper respirations, not shallow.  No accessory muscle use Bleeding around her upper lip, possibly loose upper left medial incisor Extraocular motion intact, normal symmetric shoulder shrug, facial muscle strength.  Preserved peripheral strength-pushing and pulling, lifting her legs off the bed.  Comprehension appears intact.  Hold DVT prophylaxis. Since she is not on full dose anticoagulation no need for head imaging at this time.  Stat head imaging required if she develops any neurologic changes.  This gust with bedside RN.  Steffanie Dunn, DO 07/05/23 4:48 PM [facility]

## 2023-07-06 ENCOUNTER — Inpatient Hospital Stay (HOSPITAL_COMMUNITY): Payer: MEDICAID

## 2023-07-06 DIAGNOSIS — D649 Anemia, unspecified: Secondary | ICD-10-CM

## 2023-07-06 DIAGNOSIS — J9601 Acute respiratory failure with hypoxia: Secondary | ICD-10-CM | POA: Diagnosis not present

## 2023-07-06 DIAGNOSIS — J441 Chronic obstructive pulmonary disease with (acute) exacerbation: Secondary | ICD-10-CM

## 2023-07-06 DIAGNOSIS — J189 Pneumonia, unspecified organism: Secondary | ICD-10-CM | POA: Diagnosis not present

## 2023-07-06 LAB — CBC WITH DIFFERENTIAL/PLATELET
Abs Immature Granulocytes: 0.13 10*3/uL — ABNORMAL HIGH (ref 0.00–0.07)
Basophils Absolute: 0 10*3/uL (ref 0.0–0.1)
Basophils Relative: 0 %
Eosinophils Absolute: 0.1 10*3/uL (ref 0.0–0.5)
Eosinophils Relative: 1 %
HCT: 33.2 % — ABNORMAL LOW (ref 36.0–46.0)
Hemoglobin: 11.1 g/dL — ABNORMAL LOW (ref 12.0–15.0)
Immature Granulocytes: 1 %
Lymphocytes Relative: 10 %
Lymphs Abs: 2.5 10*3/uL (ref 0.7–4.0)
MCH: 28.6 pg (ref 26.0–34.0)
MCHC: 33.4 g/dL (ref 30.0–36.0)
MCV: 85.6 fL (ref 80.0–100.0)
Monocytes Absolute: 2 10*3/uL — ABNORMAL HIGH (ref 0.1–1.0)
Monocytes Relative: 8 %
Neutro Abs: 19.1 10*3/uL — ABNORMAL HIGH (ref 1.7–7.7)
Neutrophils Relative %: 80 %
Platelets: 417 10*3/uL — ABNORMAL HIGH (ref 150–400)
RBC: 3.88 MIL/uL (ref 3.87–5.11)
RDW: 14.4 % (ref 11.5–15.5)
WBC: 23.8 10*3/uL — ABNORMAL HIGH (ref 4.0–10.5)
nRBC: 0 % (ref 0.0–0.2)

## 2023-07-06 LAB — BASIC METABOLIC PANEL
Anion gap: 11 (ref 5–15)
BUN: 14 mg/dL (ref 8–23)
CO2: 27 mmol/L (ref 22–32)
Calcium: 8.6 mg/dL — ABNORMAL LOW (ref 8.9–10.3)
Chloride: 98 mmol/L (ref 98–111)
Creatinine, Ser: 0.57 mg/dL (ref 0.44–1.00)
GFR, Estimated: >60 mL/min (ref >60)
Glucose, Bld: 105 mg/dL — ABNORMAL HIGH (ref 70–99)
Potassium: 3.5 mmol/L (ref 3.5–5.1)
Sodium: 136 mmol/L (ref 135–145)

## 2023-07-06 LAB — CULTURE, RESPIRATORY W GRAM STAIN: Culture: NORMAL

## 2023-07-06 LAB — GLUCOSE, CAPILLARY
Glucose-Capillary: 111 mg/dL — ABNORMAL HIGH (ref 70–99)
Glucose-Capillary: 112 mg/dL — ABNORMAL HIGH (ref 70–99)
Glucose-Capillary: 118 mg/dL — ABNORMAL HIGH (ref 70–99)
Glucose-Capillary: 129 mg/dL — ABNORMAL HIGH (ref 70–99)
Glucose-Capillary: 134 mg/dL — ABNORMAL HIGH (ref 70–99)
Glucose-Capillary: 98 mg/dL (ref 70–99)

## 2023-07-06 LAB — PHOSPHORUS: Phosphorus: 3.8 mg/dL (ref 2.5–4.6)

## 2023-07-06 LAB — MAGNESIUM: Magnesium: 2.4 mg/dL (ref 1.7–2.4)

## 2023-07-06 MED ORDER — PROMETHAZINE-CODEINE 6.25-10 MG/5ML PO SYRP
5.0000 mL | ORAL_SOLUTION | Freq: Three times a day (TID) | ORAL | Status: DC | PRN
  Filled 2023-07-06: qty 5

## 2023-07-06 MED ORDER — POTASSIUM CHLORIDE 20 MEQ PO PACK
40.0000 meq | PACK | Freq: Once | ORAL | Status: AC
  Administered 2023-07-06: 40 meq
  Filled 2023-07-06: qty 2

## 2023-07-06 MED ORDER — LABETALOL HCL 5 MG/ML IV SOLN
10.0000 mg | INTRAVENOUS | Status: DC | PRN
  Administered 2023-07-07: 10 mg via INTRAVENOUS
  Filled 2023-07-06: qty 4

## 2023-07-06 NOTE — Plan of Care (Signed)
  Problem: Clinical Measurements: Goal: Ability to maintain clinical measurements within normal limits will improve Outcome: Progressing   

## 2023-07-06 NOTE — Progress Notes (Signed)
CVAD removed per protocol per MD order. Manual pressure applied for 2 mins. Vaseline gauze, gauze, and tape applied over insertion site. No bleeding or swelling noted. Pt lethargic at time of removal and would not follow commands. PICC pulled at time of exhale. Primary Rn notified and aware of removal and lifting restrictions to R side and to allow pt to remain in bed for 30 min.

## 2023-07-06 NOTE — Progress Notes (Signed)
Physical Therapy Treatment Patient Details Name: Casey [name] MRN: [id number] DOB: May 07, 1961 Today's Date: 07/06/2023   History of Present Illness 62 yo female transferred to [facility] 8/17 from [facility] with AMS (presumed drug intoxication, UDS +cocaine/BZD/THC) and multifocal PNA, possible aspiration. Intubated  8/20-8/25. Tenuous with persistent tachypnea, cough with shallow breathing on HHFNC/Bipap post extubation. PMH: chronic pain, anxiety/depression, panic disorder, suicide attempt, polysubstance abuse.    PT Comments  Pt pleasant and willing to get OOB. Pt with fatigue limiting function but able to take some steps with reliance on RW and educated for LB HEP. Pt on 40L at 40% HHFNC throughout with SPO2 93% and HR 90. Pt encouraged to be OOB with assist daily and continue HEP. Will follow with plan updated after CIR screen. Patient will benefit from continued inpatient follow up therapy, <3 hours/day     If plan is discharge home, recommend the following: A lot of help with walking and/or transfers;A lot of help with bathing/dressing/bathroom;Assistance with cooking/housework;Direct supervision/assist for medications management;Assist for transportation;Help with stairs or ramp for entrance   Can travel by private vehicle     No  Equipment Recommendations  Rolling walker (2 wheels);BSC/3in1    Recommendations for Other Services       Precautions / Restrictions Precautions Precautions: Fall;Other (comment) Precaution Comments: HHFNC Restrictions Weight Bearing Restrictions: No     Mobility  Bed Mobility Overal bed mobility: Needs Assistance Bed Mobility: Supine to Sit Rolling: Min assist, Used rails         General bed mobility comments: HOB 15 degrees with min assist to fully rise to sitting and pivot to EOB with assist for lines    Transfers Overall transfer level: Needs assistance   Transfers: Sit to/from Stand, Bed to chair/wheelchair/BSC Sit to Stand: Min  assist Stand pivot transfers: Min assist         General transfer comment: min assist to rise and steady from bed with min physical assist for balance of bil UE HHA to pivot to chair. Repeated standing from chair with cues for hand placement and safety, RW present for stability with guarding    Ambulation/Gait Ambulation/Gait assistance: Min assist Gait Distance (Feet): 6 Feet Assistive device: Rolling walker (2 wheels) Gait Pattern/deviations: Step-to pattern, Decreased stride length, Narrow base of support       General Gait Details: pt with min assist for balance and RW support to step 3' forward and back at chair with HHFNC. pt denied further trials due to fatigue   Stairs             Wheelchair Mobility     Tilt Bed    Modified Rankin (Stroke Patients Only)       Balance Overall balance assessment: Needs assistance, History of Falls Sitting-balance support: Feet supported, No upper extremity supported Sitting balance-Leahy Scale: Fair     Standing balance support: Bilateral upper extremity supported, Reliant on assistive device for balance Standing balance-Leahy Scale: Poor Standing balance comment: UB support on therapist or RW in standing                            Cognition Arousal: Alert Behavior During Therapy: WFL for tasks assessed/performed Overall Cognitive Status: Impaired/Different from baseline Area of Impairment: Memory, Following commands, Safety/judgement, Problem solving                     Memory: Decreased short-term memory Following Commands: Follows one step  commands inconsistently, Follows one step commands with increased time Safety/Judgement: Decreased awareness of deficits, Decreased awareness of safety   Problem Solving: Slow processing General Comments: pt with slow processing, decreased awareness of safety        Exercises General Exercises - Lower Extremity Long Arc Quad: AROM, Both, Seated, 20  reps, Strengthening Hip Flexion/Marching: AROM, Both, Seated, 20 reps    General Comments        Pertinent Vitals/Pain Pain Assessment Pain Assessment: No/denies pain    Home Living                          Prior Function            PT Goals (current goals can now be found in the care plan section) Progress towards PT goals: Progressing toward goals    Frequency    Min 1X/week      PT Plan      Co-evaluation              AM-PAC PT "6 Clicks" Mobility   Outcome Measure  Help needed turning from your back to your side while in a flat bed without using bedrails?: A Little Help needed moving from lying on your back to sitting on the side of a flat bed without using bedrails?: A Little Help needed moving to and from a bed to a chair (including a wheelchair)?: A Little Help needed standing up from a chair using your arms (e.g., wheelchair or bedside chair)?: A Little Help needed to walk in hospital room?: A Lot Help needed climbing 3-5 steps with a railing? : Total 6 Click Score: 15    End of Session Equipment Utilized During Treatment: Oxygen Activity Tolerance: Patient limited by fatigue Patient left: in chair;with call bell/phone within reach;with chair alarm set Nurse Communication: Mobility status PT Visit Diagnosis: Other abnormalities of gait and mobility (R26.89);Muscle weakness (generalized) (M62.81)     Time: 9563-8756 PT Time Calculation (min) (ACUTE ONLY): 23 min  Charges:    $Therapeutic Exercise: 8-22 mins $Therapeutic Activity: 8-22 mins PT General Charges $$ ACUTE PT VISIT: 1 Visit                     Casey [name], PT Acute Rehabilitation Services Office: [phone]    Casey [name] 07/06/2023, 11:01 AM

## 2023-07-06 NOTE — Evaluation (Signed)
Occupational Therapy Evaluation Patient Details Name: HAU DANNENBERG MRN: [id number] DOB: 1961-08-02 Today's Date: 07/06/2023   History of Present Illness 62 yo female transferred to [facility] 8/17 from [facility] with AMS (presumed drug intoxication, UDS +cocaine/BZD/THC) and multifocal PNA, possible aspiration. Intubated  8/20-8/25. Tenuous with persistent tachypnea, cough with shallow breathing on HHFNC/Bipap post extubation. PMH: chronic pain, anxiety/depression, panic disorder, suicide attempt, polysubstance abuse.   Clinical Impression   Deshia was evaluated s/p the above admission list. She is indep at baseline. Upon evaluation the pt was limited by generalized weakness, impaired communication with a really soft voice, poor activity tolerance, slowed processing and problems solving and limited sitting tolerance. Overall she needed min A for bed mobility, CGA for unsupported sitting balance and min A for a simple STS transfer. Due to the deficits listed below the pt also needs up to mod A fro LB ADLs and min A for UB ADLs. Pt tolerated unsupported sitting for 30 minutes during hair brushing task at the EOB, pt needed frequent rest breaks due to BUE fatigue with overhead reaching and trunk weakness. Pt will benefit from continued acute OT services and intensive inpatient follow up therapy, >3 hours/day after discharge.         If plan is discharge home, recommend the following: A lot of help with bathing/dressing/bathroom;A little help with walking and/or transfers;Assistance with cooking/housework;Direct supervision/assist for medications management;Direct supervision/assist for financial management;Assist for transportation;Help with stairs or ramp for entrance    Functional Status Assessment  Patient has had a recent decline in their functional status and demonstrates the ability to make significant improvements in function in a reasonable and predictable amount of time.  Equipment Recommendations   None recommended by OT (defer)    Recommendations for Other Services Rehab consult     Precautions / Restrictions Precautions Precautions: Fall;Other (comment) Precaution Comments: HHFNC Restrictions Weight Bearing Restrictions: No      Mobility Bed Mobility Overal bed mobility: Needs Assistance Bed Mobility: Supine to Sit, Sit to Supine     Supine to sit: Min assist Sit to supine: Min assist        Transfers Overall transfer level: Needs assistance Equipment used: 1 person hand held assist Transfers: Sit to/from Stand Sit to Stand: Min assist           General transfer comment: pt fatigued from prolonged sitting and prior OT session      Balance Overall balance assessment: Needs assistance, History of Falls Sitting-balance support: Feet supported, No upper extremity supported Sitting balance-Leahy Scale: Fair Sitting balance - Comments: prolonged unsupported sitting ~30 minutes   Standing balance support: Bilateral upper extremity supported, Reliant on assistive device for balance Standing balance-Leahy Scale: Poor Standing balance comment: UB support on therapist or RW in standing                           ADL either performed or assessed with clinical judgement   ADL Overall ADL's : Needs assistance/impaired Eating/Feeding: Set up   Grooming: Minimal assistance;Sitting Grooming Details (indicate cue type and reason): min A for cues and assist once RUE was fatigued Upper Body Bathing: Minimal assistance;Sitting   Lower Body Bathing: Moderate assistance;Sit to/from stand   Upper Body Dressing : Set up;Sitting   Lower Body Dressing: Moderate assistance;Sit to/from stand   Toilet Transfer: Minimal assistance;Stand-pivot;BSC/3in1;Rolling walker (2 wheels)   Toileting- Clothing Manipulation and Hygiene: Contact guard assist;Sitting/lateral lean  Functional mobility during ADLs: Minimal assistance General ADL Comments: limited by  generalized weakness, fatigue and decreased activity tolerance     Vision Baseline Vision/History: 0 No visual deficits Vision Assessment?: No apparent visual deficits     Perception Perception: Not tested       Praxis         Pertinent Vitals/Pain Pain Assessment Pain Assessment: No/denies pain Pain Intervention(s): Monitored during session     Extremity/Trunk Assessment Upper Extremity Assessment Upper Extremity Assessment: Right hand dominant   Lower Extremity Assessment Lower Extremity Assessment: Defer to PT evaluation   Cervical / Trunk Assessment Cervical / Trunk Assessment: Kyphotic (likely due to weakness)   Communication Communication Communication: Other (comment) (soft voice, difficult to understand at times)   Cognition Arousal: Alert Behavior During Therapy: WFL for tasks assessed/performed Overall Cognitive Status: Impaired/Different from baseline Area of Impairment: Awareness, Following commands, Problem solving                       Following Commands: Follows one step commands consistently Safety/Judgement: Decreased awareness of deficits Awareness: Emergent Problem Solving: Slow processing General Comments: requires increased time for processing, she is difficult to understand at times     General Comments  VSS on [facility]    Exercises     Shoulder Instructions      Home Living Family/patient expects to be discharged to:: Private residence Living Arrangements: Children Available Help at Discharge: Family;Available 24 hours/day Type of Home: House Home Access: Stairs to enter [employer] of Steps: 1   Home Layout: One level     Bathroom Shower/Tub: Chief Strategy Officer: Standard     Home Equipment: None   Additional Comments: son is disabled and `lives with pt      Prior Functioning/Environment Prior Level of Function : Independent/Modified Independent             Mobility Comments:  without AD          OT Problem List: Decreased range of motion;Decreased strength;Decreased activity tolerance;Impaired balance (sitting and/or standing);Decreased safety awareness;Decreased cognition;Decreased knowledge of use of DME or AE;Decreased knowledge of precautions      OT Treatment/Interventions: Self-care/ADL training;Therapeutic exercise;DME and/or AE instruction;Therapeutic activities;Patient/family education;Balance training    OT Goals(Current goals can be found in the care plan section) Acute Rehab OT Goals Patient Stated Goal: to get rest OT Goal Formulation: With patient Time For Goal Achievement: 07/20/23 Potential to Achieve Goals: Good ADL Goals Pt Will Perform Grooming: with supervision;standing Pt Will Perform Lower Body Dressing: with contact guard assist;sit to/from stand Pt Will Transfer to Toilet: with supervision;ambulating Additional ADL Goal #1: Pt will tolerate at lesat 8 minutes of standing funcitonal activity to improve enduracne for ADLs Additional ADL Goal #2: Pt will follow 3 step directional tasks with minimal cues in perparation for IADLs  OT Frequency: Min 1X/week       AM-PAC OT "6 Clicks" Daily Activity     Outcome Measure Help from another person eating meals?: A Little Help from another person taking care of personal grooming?: A Little Help from another person toileting, which includes using toliet, bedpan, or urinal?: A Lot Help from another person bathing (including washing, rinsing, drying)?: A Lot Help from another person to put on and taking off regular upper body clothing?: A Little Help from another person to put on and taking off regular lower body clothing?: A Lot 6 Click Score: 15   End of Session Equipment  Utilized During Treatment: Oxygen Nurse Communication: Mobility status  Activity Tolerance: Patient tolerated treatment well Patient left: in bed;with call bell/phone within reach;with bed alarm set  OT Visit  Diagnosis: Unsteadiness on feet (R26.81);Other abnormalities of gait and mobility (R26.89);Muscle weakness (generalized) (M62.81)                Time: 9562-1308 OT Time Calculation (min): 37 min Charges:  OT General Charges $OT Visit: 1 Visit OT Evaluation $OT Eval Moderate Complexity: 1 Mod OT Treatments $Self Care/Home Management : 8-22 mins  Derenda Mis, OTR/L Acute Rehabilitation Services Office [phone] Secure Chat Communication Preferred   Donia Pounds 07/06/2023, 11:43 AM

## 2023-07-06 NOTE — Progress Notes (Addendum)
NAME:  PAMILA URBAIN, MRN:  [id number], DOB:  06-Mar-1961, LOS: 10 ADMISSION DATE:  06/25/2023 CONSULTATION DATE:  06/26/2023 REFERRING MD:  Lyn Hollingshead - [facility] ([facility]) CHIEF COMPLAINT:  AMS, SOB  History of Present Illness:  62 year old woman who presented to [facility] 8/17 as a transfer from [facility] ICU for AMS, hypoxia requiring BiPAP and possible aspiration PNA. PMHx significant for chronic pain, anxiety/depression, panic disorder, suicide attempt, polysubstance abuse.  Patient initially presented to [facility] ED 8/16 after being found at home unresponsive by family. EMS was called, administered Narcan 2mg  IN with improvement in mental status. Per chart review "possible took two Xanax that weren't Xanax". Patient initially refused transport to hospital but eventually agreed. Drowsy with garbled speech with EMS but able to follow commands. Hypoxic to 86% on RA, initially placed on [place] but escalated to NRB with improvement in SpO2 to 96%. In ED, patient was hypothermic, tachycardic to 100s, BP 139/95, RR 38, SpO2 89% on NRB. Labs were notable for WBC 10.6, Hgb 13.2, Plt 414. Na 136, K 3.5, CO2 22, Cr 0.76, LFTs WNL. UA unremarkable. Ethanol and APAP levels negative. UDS+ cocaine, BZD and THC. CXR demonstrated asymmetric bibasilar interstitial and airspace infiltrates c/f multifocal PNA/aspiration. Unasyn ordered in ED but changed to ceftriaxone due to ?allergy. Patient was subsequently admitted to [facility].  On 8/17, patient was noted to have persistent respiratory distress with escalating O2 requirements to HFNC and eventually BiPAP. CXR appeared worse with rapid progression of diffuse bilateral heterogeneous consolidative airspace disease in LUL and R lung, c/w worsened multifocal PNA and small layering R pleural effusion.   PCCM consulted for transfer to [facility] for further care.  Pertinent Medical History:   Past Medical History:  Diagnosis Date   Anxiety    Chronic back pain    Depression    History of panic attacks  09/11/2014   Medical history non-contributory    Polysubstance abuse (HCC)    History of taking non-prescribed opiates, BZDs; also +cocaine and THC   Ruptured disc, thoracic    Suicide attempt (HCC) 2002   Significant Hospital Events: Including procedures, antibiotic start and stop dates in addition to other pertinent events   8/16 - Presented to [facility] via EMS for AMS, hypoxia. Presumed drug intoxication. UDS +cocaine, BZDs, THC. CXR with multifocal PNA. Required [place] then NRB then HFNC. Empiric ceftriaxone. 8/17 - Worsening respiratory status requiring escalation to BiPAP. ABG poor. CXR worse. Transferred to [facility] for further care. 8/18: Opt-flow 8/20: placed on BiPAP overnight. Continued labored breathing in AM, subsequently intubated. Switched abx to ceftriaxone and vancomycin. Dc'd steroids. 8/21: continued agitation on precedex/fentanyl. Started on propofol gtt. Started on seroquel 8/22: DC antibiotics. off precedex. Weaning prop/fent 8/24 increased Seroquel.  Reattempting Precedex trial.  Was apneic on propofol and fentanyl infusion 8/25 on fent overnight in addition to precedex. Today much better after seroquel adjustment and diuresis. Favorable SBI so extubated to heated high flow.  8/26 remains on HFNC. On precedex overnight but off in AM. Lasix x1. In afternoon, fell from bed - minor abrasions but no significant injury. BiPAP overnight 8/27 DC PICC and foley. Continues on HFNC  Interim History / Subjective:  Used BiPAP overnight. Received Kcl overnight for K 3.5. States her breathing is about the same as yesterday. Reports some soreness in her nose and knee after her fall yesterday. Continues to have post tussive vomiting.  Objective:  Blood pressure (!) 161/82, pulse 90, temperature 98.3 F (36.8 C), temperature source Oral, resp.  rate (!) 25, height 5\' 5"  (1.651 m), weight 62 kg, SpO2 95%.    FiO2 (%):  [35 %-100 %] 40 %   Intake/Output Summary (Last 24 hours) at 07/06/2023  1032 Last data filed at 07/06/2023 2130 Gross per 24 hour  Intake 776.12 ml  Output 2200 ml  Net -1423.88 ml   Filed Weights   07/03/23 0500 07/04/23 0500 07/05/23 0443  Weight: 67.7 kg 67.7 kg 62 kg   Physical Examination:  Gen: NAD sitting in chair, chronically ill, appears weak and deconditioned HEENT: PERRLA, EOMI. Upper lip with healing abrasions, no bleeding. L upper front incisor slightly loose. Otherwise [place]/AT. HFNC in place CV: RRR no murmurs appreciated Pulm: tachypneic upper 20s, scattered rhonchi bilaterally, occasional cough. Comfortable WOB on HFNC Abd: Soft, NT/ND Neuro: Awake, alert, appropriately responsive. CN II-XII intact without focal deficit. Strength intact b/l upper and lower extremities.  Speaks softly and in short sentences. Ext: No significant BLE edema  CBC    Component Value Date/Time   WBC 23.8 (H) 07/06/2023 0401   RBC 3.88 07/06/2023 0401   HGB 11.1 (L) 07/06/2023 0401   HGB 13.7 12/23/2022 1243   HCT 33.2 (L) 07/06/2023 0401   HCT 40.2 12/23/2022 1243   PLT 417 (H) 07/06/2023 0401   PLT 449 12/23/2022 1243   MCV 85.6 07/06/2023 0401   MCV 91 12/23/2022 1243   MCH 28.6 07/06/2023 0401   MCHC 33.4 07/06/2023 0401   RDW 14.4 07/06/2023 0401   RDW 13.1 12/23/2022 1243   LYMPHSABS 2.5 07/06/2023 0401   LYMPHSABS 2.8 12/23/2022 1243   MONOABS 2.0 (H) 07/06/2023 0401   EOSABS 0.1 07/06/2023 0401   EOSABS 0.1 12/23/2022 1243   BASOSABS 0.0 07/06/2023 0401   BASOSABS 0.0 12/23/2022 1243  BMET    Component Value Date/Time   NA 136 07/06/2023 0401   NA 142 12/23/2022 1243   K 3.5 07/06/2023 0401   CL 98 07/06/2023 0401   CO2 27 07/06/2023 0401   GLUCOSE 105 (H) 07/06/2023 0401   BUN 14 07/06/2023 0401   BUN 7 (L) 12/23/2022 1243   CREATININE 0.57 07/06/2023 0401   CALCIUM 8.6 (L) 07/06/2023 0401   EGFR 103 12/23/2022 1243   GFRNONAA >60 07/06/2023 0401     Resolved Hospital Problem List:   Assessment & Plan:  Acute hypoxemic  respiratory failure in the setting of multifocal PNA (presumed aspiration) Leukocytosis without fever Deconditioning Ongoing yet improving respiratory failure in the setting of agitation, deconditioning, and volume overload 2/2 aspiration. Extubated 8/25, now stable on HFNC. Suspect she is high risk for reintubation given her significant deconditioning. Received Lasix 40mg  IV with good urine output. . Leukocytosis worsening to 23.8 today, unclear etiology of this given no recent steroids, afebrile without significant clinical changes, s/p antibiotic course for PNA. Possibly related to PICC/foley. Plan Attempt to remove picc if able to place 2nd IV Remove Foley Continue HFNC wean as tolerated 8/24 cultures NGTD, off abx since 8/22  Monitor CBC and fever curve Appreciate PT/OT/SLP Chest PT   Acute metabolic encephalopathy secondary to sepsis, and prolonged critical illness complicated further by GAD w/ h/o panic disorder, depression, Chronic pain c/b Polysubstance abuse -UDS +cocaine, BZDs, THC. History of taking non-prescribed opiates/BZDs per family (daily xanax off street) Continued agitation today Plan Continuing Celexa, Seroquel, and clonazepam as well as scheduled oxycodone. Seroquel increased to 100mg  bid on 8/24. Oxy and clonazepam will need slow prolonged taper. Consider decreasing clonazepam if improving Off precedex gtt  Off fent gtt DC'd robaxin and PRN versed Maximize nonopioid adjuncts Cessation education when appropriate  Vomiting - mostly post tussive, benign abd exam -KUB -Phenergan cough syrup (Qtc normal)  Fall 8/26 with dental injury Slightly loose incisor on exam, otherwise no significant injuries aside from abrasions -consult on call dentist  Hyperglycemia Stable, off steroids   Anemia 2/2 acute illness Stable, continue to monitor Transfuse for hgb <7  Best Practice: (right click and "Reselect all SmartList Selections" daily)   Diet/type: tubefeeds per  RD - passed swallow screen per SLP but feel her deconditioning precludes her from advancing her diet for now. TF held due to vomiting - attempt to restart pending response to phenergan syrup DVT prophylaxis: prophylactic heparin  GI prophylaxis: N/A Lines: PICC - attempt removal today if able to obtain additional PIV Foley:  removal ordered  Code Status:  full code Last date of multidisciplinary goals of care discussion: with  patient.     Vonna Drafts, MD 07/06/2023, 10:32 AM

## 2023-07-06 NOTE — Progress Notes (Signed)
   07/06/23 2259  BiPAP/CPAP/SIPAP  BiPAP/CPAP/SIPAP Pt Type Adult  BiPAP/CPAP/SIPAP V60  Mask Type Full face mask  Mask Size Medium  Set Rate 14 breaths/min  Respiratory Rate 32 breaths/min  IPAP 14 cmH20  EPAP 6 cmH2O  FiO2 (%) 40 %  Minute Ventilation 14.2  Leak 0  Peak Inspiratory Pressure (PIP) 14  Tidal Volume (Vt) 420  Patient Home Equipment No  Auto Titrate No  Press High Alarm 25 cmH2O  Press Low Alarm 5 cmH2O  BiPAP/CPAP /SiPAP Vitals  Pulse Rate 87  Resp (!) 32  SpO2 96 %  Bilateral Breath Sounds Diminished;Clear  MEWS Score/Color  MEWS Score 2  MEWS Score Color Yellow

## 2023-07-06 NOTE — Progress Notes (Signed)
Son updated at bedside regarding his mother's ongoing care. He was informed today about his mother's fall and her loose tooth. Understandably he is upset and worried about her having pain from this. He confirmed with me that she has pain medication available- oxycodone.  We discussed the risks and benefits of restraints and we want to minimize risk to her, but she still managed to fall. I relayed to him what the dentist had said and he is frustrated that she will not be able to get dental care until after discharge and is concerned about the cost of this care. We reviewed her ongoing care plan and that she needs improvement in her respiratory status before she can transfer out of the ICU. I think she likely has up to a week before she will be stable to transfer out of the ICU. He requested a letter to the [facility] tomorrow at [facility] to let them know she will be unable to make her court time. I provided a letter today and told him I will send this to the court (fax [phone]) tomorrow morning with an updated date.  Steffanie Dunn, DO 07/06/23 3:29 PM [facility]  For contact information, see Amion. If no response to pager, please call PCCM consult pager. After hours, 7PM- 7AM, please call Elink.

## 2023-07-06 NOTE — Progress Notes (Signed)
[facility] ADULT ICU REPLACEMENT PROTOCOL   The patient does apply for the [facility] Adult ICU Electrolyte Replacment Protocol based on the criteria listed below:   1.Exclusion criteria: TCTS, ECMO, Dialysis, and Myasthenia Gravis patients 2. Is GFR >/= 30 ml/min? Yes.    Patient's GFR today is >60 3. Is SCr </= 2? Yes.   Patient's SCr is 0.57 mg/dL 4. Did SCr increase >/= 0.5 in 24 hours? No. 5.Pt's weight >40kg  Yes.   6. Abnormal electrolyte(s): potassium 3.5  7. Electrolytes replaced per protocol 8.  Call MD STAT for K+ </= 2.5, Phos </= 1, or Mag </= 1 Physician:  protocol  Melvern Banker 07/06/2023 5:25 AM

## 2023-07-06 NOTE — Progress Notes (Addendum)
Called ENT regarding loose tooth-- recommended calling on call dentist.   Called on-call dentist Dr. Garvin Fila, unable to leave message at first number, left a message at second number listed in the chart.   Steffanie Dunn, DO 07/06/23 9:56 AM [place] [facility]  For contact information, see Amion. If no response to pager, please call PCCM consult pager. After hours, 7PM- 7AM, please call Elink.   Called Dr. Sol Blazing office back - was able to speak to someone in his office. Patient's information as well as my contact information given.   Steffanie Dunn, DO 07/06/23 2:12 PM [place] [facility]    Spoke to dentist Dr. Oneita Kras over the phone -- recommended to see a dentist as an outpatient. Since she is so frail, she is unlikely to benefit from splinting/ bracing, or tolerate a dental extraction right now. Nothing to do currently-- it would not be feasible to splint her tooth right now.   Steffanie Dunn, DO 07/06/23 2:19 PM [place]  For contact information, see Amion. If no response to pager, please call PCCM consult pager. After hours, 7PM- 7AM, please call Elink.

## 2023-07-06 NOTE — Progress Notes (Signed)
   [facility] [address] [place], [place] [address]  July 06, 2023  To Whom It May Concern:  Casey [name] was admitted within the [facility] on 06/26/2023 and remains under my ongoing care. She will be unable to attend her court appointment on 06/26/2013 due to ongoing hospitalization.    Steffanie Dunn, DO 07/06/23 3:18 PM [place] [facility]

## 2023-07-07 DIAGNOSIS — R1111 Vomiting without nausea: Secondary | ICD-10-CM

## 2023-07-07 DIAGNOSIS — R410 Disorientation, unspecified: Secondary | ICD-10-CM | POA: Diagnosis not present

## 2023-07-07 DIAGNOSIS — J9601 Acute respiratory failure with hypoxia: Secondary | ICD-10-CM | POA: Diagnosis not present

## 2023-07-07 DIAGNOSIS — G9341 Metabolic encephalopathy: Secondary | ICD-10-CM | POA: Diagnosis not present

## 2023-07-07 LAB — MAGNESIUM: Magnesium: 2.4 mg/dL (ref 1.7–2.4)

## 2023-07-07 LAB — CBC WITH DIFFERENTIAL/PLATELET
Abs Immature Granulocytes: 0.16 10*3/uL — ABNORMAL HIGH (ref 0.00–0.07)
Basophils Absolute: 0 10*3/uL (ref 0.0–0.1)
Basophils Relative: 0 %
Eosinophils Absolute: 0.2 10*3/uL (ref 0.0–0.5)
Eosinophils Relative: 1 %
HCT: 35.7 % — ABNORMAL LOW (ref 36.0–46.0)
Hemoglobin: 11.5 g/dL — ABNORMAL LOW (ref 12.0–15.0)
Immature Granulocytes: 1 %
Lymphocytes Relative: 10 %
Lymphs Abs: 2.3 10*3/uL (ref 0.7–4.0)
MCH: 27 pg (ref 26.0–34.0)
MCHC: 32.2 g/dL (ref 30.0–36.0)
MCV: 83.8 fL (ref 80.0–100.0)
Monocytes Absolute: 1.7 10*3/uL — ABNORMAL HIGH (ref 0.1–1.0)
Monocytes Relative: 8 %
Neutro Abs: 18 10*3/uL — ABNORMAL HIGH (ref 1.7–7.7)
Neutrophils Relative %: 80 %
Platelets: 585 10*3/uL — ABNORMAL HIGH (ref 150–400)
RBC: 4.26 MIL/uL (ref 3.87–5.11)
RDW: 14.4 % (ref 11.5–15.5)
WBC: 22.4 10*3/uL — ABNORMAL HIGH (ref 4.0–10.5)
nRBC: 0 % (ref 0.0–0.2)

## 2023-07-07 LAB — GLUCOSE, CAPILLARY
Glucose-Capillary: 114 mg/dL — ABNORMAL HIGH (ref 70–99)
Glucose-Capillary: 120 mg/dL — ABNORMAL HIGH (ref 70–99)
Glucose-Capillary: 121 mg/dL — ABNORMAL HIGH (ref 70–99)
Glucose-Capillary: 127 mg/dL — ABNORMAL HIGH (ref 70–99)
Glucose-Capillary: 131 mg/dL — ABNORMAL HIGH (ref 70–99)
Glucose-Capillary: 96 mg/dL (ref 70–99)

## 2023-07-07 LAB — BASIC METABOLIC PANEL
Anion gap: 12 (ref 5–15)
BUN: 17 mg/dL (ref 8–23)
CO2: 25 mmol/L (ref 22–32)
Calcium: 9.2 mg/dL (ref 8.9–10.3)
Chloride: 98 mmol/L (ref 98–111)
Creatinine, Ser: 0.6 mg/dL (ref 0.44–1.00)
GFR, Estimated: >60 mL/min (ref >60)
Glucose, Bld: 141 mg/dL — ABNORMAL HIGH (ref 70–99)
Potassium: 3.6 mmol/L (ref 3.5–5.1)
Sodium: 135 mmol/L (ref 135–145)

## 2023-07-07 LAB — PHOSPHORUS: Phosphorus: 4.4 mg/dL (ref 2.5–4.6)

## 2023-07-07 MED ORDER — PROMETHAZINE-CODEINE 6.25-10 MG/5ML PO SYRP: 5.0000 mL | ORAL_SOLUTION | Freq: Three times a day (TID) | ORAL | Status: DC | PRN

## 2023-07-07 MED ORDER — PROMETHAZINE HCL 6.25 MG/5ML PO SOLN
12.5000 mg | Freq: Four times a day (QID) | ORAL | Status: DC | PRN
  Administered 2023-07-07 – 2023-07-08 (×2): 12.5 mg
  Filled 2023-07-07 (×3): qty 10

## 2023-07-07 MED ORDER — ORAL CARE MOUTH RINSE
15.0000 mL | OROMUCOSAL | Status: DC
  Administered 2023-07-07 – 2023-07-12 (×19): 15 mL via OROMUCOSAL

## 2023-07-07 MED ORDER — ORAL CARE MOUTH RINSE: 15.0000 mL | OROMUCOSAL | Status: DC | PRN

## 2023-07-07 NOTE — Progress Notes (Signed)
Nutrition Follow-up  DOCUMENTATION CODES:   Non-severe (moderate) malnutrition in context of social or environmental circumstances  INTERVENTION:  Advance continuous enteral nutrition via Cortrak tube to goal rate as tolerated: - Osmolite 1.5 @ 50 ml/hr (1200 ml/day) - PROSource TF20 60 ml daily   Tube feeding regimen provides 1880 kcal, 95 grams of protein, and 914 ml of H2O.   - Continue MVI with minerals daily per tube   - Add Banatrol TF BID per tube  - Continue Regular diet, Nectar thick liquids.   NUTRITION DIAGNOSIS:   Moderate Malnutrition related to social / environmental circumstances (polysubstance abuse) as evidenced by mild fat depletion, mild muscle depletion.  GOAL:   Patient will meet greater than or equal to 90% of their needs  MONITOR:   PO intake, Labs, Weight trends, TF tolerance, I & O's  REASON FOR ASSESSMENT:   Consult Enteral/tube feeding initiation and management  ASSESSMENT:   62 year old female who presented to the ED on 8/16 after being found unresponsive by family. PMH of anxiety, chronic pain, depression, suicide attempt, polysubstance abuse. Pt admitted with acute hypoxemic respiratory failure in the setting of aspiration pneumonia, acute metabolic encephalopathy.  Meds reviewed:  MVI. Labs reviewed: WDL.   Tube feeds were turned off this am related to vomiting. TF have been restarted at a trickle rate via trickle rate. Spoke with RN who reports that the pt ate 50% of her breakfast and lunch today. RD team will continue to closely monitor TF tolerance and PO intakes.    Diet Order:   Diet Order             Diet regular Fluid consistency: Nectar Thick  Diet effective now                   EDUCATION NEEDS:   Not appropriate for education at this time  Skin:  Skin Assessment: Reviewed RN Assessment  Last BM:  8/28 - type 6  Height:   Ht Readings from Last 1 Encounters:  06/29/23 5\' 5"  (1.651 m)    Weight:   Wt  Readings from Last 1 Encounters:  07/05/23 62 kg    Ideal Body Weight:  56.8 kg  BMI:  Body mass index is 22.75 kg/m.  Estimated Nutritional Needs:   Kcal:  1700-1900  Protein:  85-100 grams  Fluid:  1.7-1.9 L  Bethann Humble, RD, LDN, CNSC.

## 2023-07-07 NOTE — Progress Notes (Signed)
    [facility] [address] [place], [place] [address]    [facility] To whom it may concern:  Casey [name] was admitted to [facility] on 06/26/2023 and remains under my ongoing care. She will be unable to attend her court appointment today due to ongoing hospitalization.    Steffanie Dunn, DO 07/07/23 7:01 AM [place] [facility]

## 2023-07-07 NOTE — Progress Notes (Signed)
NAME:  DEEANDRA HELING, MRN:  [id number], DOB:  March 07, 1961, LOS: 11 ADMISSION DATE:  06/25/2023 CONSULTATION DATE:  06/26/2023 REFERRING MD:  Lyn Hollingshead - TRH ([facility]) CHIEF COMPLAINT:  AMS, SOB  History of Present Illness:  62 year old woman who presented to [facility] 8/17 as a transfer from [facility] ICU for AMS, hypoxia requiring BiPAP and possible aspiration PNA. PMHx significant for chronic pain, anxiety/depression, panic disorder, suicide attempt, polysubstance abuse.  Patient initially presented to [facility] ED 8/16 after being found at home unresponsive by family. EMS was called, administered Narcan 2mg  IN with improvement in mental status. Per chart review "possible took two Xanax that weren't Xanax". Patient initially refused transport to hospital but eventually agreed. Drowsy with garbled speech with EMS but able to follow commands. Hypoxic to 86% on RA, initially placed on [place] but escalated to NRB with improvement in SpO2 to 96%. In ED, patient was hypothermic, tachycardic to 100s, BP 139/95, RR 38, SpO2 89% on NRB. Labs were notable for WBC 10.6, Hgb 13.2, Plt 414. Na 136, K 3.5, CO2 22, Cr 0.76, LFTs WNL. UA unremarkable. Ethanol and APAP levels negative. UDS+ cocaine, BZD and THC. CXR demonstrated asymmetric bibasilar interstitial and airspace infiltrates c/f multifocal PNA/aspiration. Unasyn ordered in ED but changed to ceftriaxone due to ?allergy. Patient was subsequently admitted to [facility].  On 8/17, patient was noted to have persistent respiratory distress with escalating O2 requirements to HFNC and eventually BiPAP. CXR appeared worse with rapid progression of diffuse bilateral heterogeneous consolidative airspace disease in LUL and R lung, c/w worsened multifocal PNA and small layering R pleural effusion.   PCCM consulted for transfer to [facility] for further care.  Pertinent Medical History:   Past Medical History:  Diagnosis Date   Anxiety    Chronic back pain    Depression    History of panic attacks  09/11/2014   Medical history non-contributory    Polysubstance abuse (HCC)    History of taking non-prescribed opiates, BZDs; also +cocaine and THC   Ruptured disc, thoracic    Suicide attempt (HCC) 2002   Significant Hospital Events: Including procedures, antibiotic start and stop dates in addition to other pertinent events   8/16 - Presented to [facility] via EMS for AMS, hypoxia. Presumed drug intoxication. UDS +cocaine, BZDs, THC. CXR with multifocal PNA. Required [place] then NRB then HFNC. Empiric ceftriaxone. 8/17 - Worsening respiratory status requiring escalation to BiPAP. ABG poor. CXR worse. Transferred to [facility] for further care. 8/18: Opt-flow 8/20: placed on BiPAP overnight. Continued labored breathing in AM, subsequently intubated. Switched abx to ceftriaxone and vancomycin. Dc'd steroids. 8/21: continued agitation on precedex/fentanyl. Started on propofol gtt. Started on seroquel 8/22: DC antibiotics. off precedex. Weaning prop/fent 8/24 increased Seroquel.  Reattempting Precedex trial.  Was apneic on propofol and fentanyl infusion 8/25 on fent overnight in addition to precedex. Today much better after seroquel adjustment and diuresis. Favorable SBI so extubated to heated high flow.  8/26 remains on HFNC. On precedex overnight but off in AM. Lasix x1. In afternoon, fell from bed - minor abrasions but no significant injury. BiPAP overnight 8/27 DC PICC and foley. Continues on HFNC 8/28 improving overall. Try to wean HFNC and treat coughing/vomiting.  Interim History / Subjective:  Used BiPAP overnight. Otherwise NAEON. Reports that her breathing feels much better compared to yesterday. She is able to speak more clearly. Endorses continued dental soreness after her fall.  Objective:  Blood pressure 131/85, pulse (!) 108, temperature 98 F (36.7 C), temperature  source Oral, resp. rate (!) 36, height 5\' 5"  (1.651 m), weight 62 kg, SpO2 99%.    FiO2 (%):  [40 %] 40 %   Intake/Output Summary  (Last 24 hours) at 07/07/2023 1111 Last data filed at 07/07/2023 1100 Gross per 24 hour  Intake 511.67 ml  Output 1141 ml  Net -629.33 ml   Filed Weights   07/03/23 0500 07/04/23 0500 07/05/23 0443  Weight: 67.7 kg 67.7 kg 62 kg   Physical Examination:  Gen: NAD sitting in chair, chronically ill, appears weak and deconditioned HEENT: PERRLA, EOMI. Upper lip with healing abrasions, no bleeding. R upper front incisor loose. L upper front incisor chipped. Otherwise [place]/AT. HFNC in place CV: RRR no murmurs appreciated Pulm: tachypneic upper 20s, CTAB, occasional wet cough. Comfortable WOB on HFNC. Speaking more clearly and loudly this morning. Abd: Soft, NT/ND Neuro: Awake, alert, oriented x3, appropriately responsive.   Ext: No significant BLE edema     Latest Ref Rng & Units 07/07/2023    9:10 AM 07/06/2023    4:01 AM 07/05/2023    5:58 AM  CBC  WBC 4.0 - 10.5 K/uL 22.4  23.8  18.5   Hemoglobin 12.0 - 15.0 g/dL 81.1  91.4  9.8   Hematocrit 36.0 - 46.0 % 35.7  33.2  30.1   Platelets 150 - 400 K/uL 585  417  358       Latest Ref Rng & Units 07/07/2023    9:10 AM 07/06/2023    4:01 AM 07/05/2023    5:58 AM  BMP  Glucose 70 - 99 mg/dL 782  956  213   BUN 8 - 23 mg/dL 17  14  14    Creatinine 0.44 - 1.00 mg/dL 0.86  5.78  4.69   Sodium 135 - 145 mmol/L 135  136  135   Potassium 3.5 - 5.1 mmol/L 3.6  3.5  4.0   Chloride 98 - 111 mmol/L 98  98  101   CO2 22 - 32 mmol/L 25  27  26    Calcium 8.9 - 10.3 mg/dL 9.2  8.6  8.4      Resolved Hospital Problem List:   Assessment & Plan:  Acute hypoxemic respiratory failure in the setting of multifocal PNA (presumed aspiration) Leukocytosis with neutrophilia, without fever Deconditioning Ongoing yet improving respiratory failure in the setting of agitation, deconditioning, and volume overload 2/2 aspiration. Extubated 8/25, now stable on HFNC. Suspect she is high risk for reintubation given her significant deconditioning and risk of  aspiration with post tussive vomiting. Leukocytosis stable at 22.4 today, unclear etiology of this given no recent steroids, afebrile without significant clinical changes, s/p antibiotic course for PNA. PICC and Foley removed 8/27. Plan Continue HFNC wean as tolerated Manage cough and vomiting - phenergan and PRN zofran 8/24 cultures NGTD, off abx since 8/22  Monitor CBC and fever curve Likely recollect sputum cx and resume abx if febrile Appreciate PT/OT/SLP Chest PT   Acute metabolic encephalopathy secondary to sepsis, and prolonged critical illness complicated further by GAD w/ h/o panic disorder, depression, Chronic pain c/b Polysubstance abuse -UDS +cocaine, BZDs, THC. History of taking non-prescribed opiates/BZDs per family (daily xanax off street) Continued agitation/impulsive behavior today Plan Continuing Celexa, Seroquel, and clonazepam as well as scheduled oxycodone. Seroquel increased to 100mg  bid on 8/24. Switch clonazepam to home xanax at discharge Off precedex gtt Off fent gtt DC'd robaxin and PRN versed Maximize nonopioid adjuncts Cessation education when appropriate  Vomiting - post tussive,  benign abd exam -KUB unremarkable -phenergan cough syrup (Qtc normal) -PRN zofran -TF per RD  Fall 8/26 with dental injury Slightly loose incisor and chipped tooth on exam, otherwise no significant injuries aside from abrasions -outpatient dental follow up  Hyperglycemia Stable, off steroids   Anemia 2/2 acute illness Stable, continue to monitor Transfuse for hgb <7  Best Practice: (right click and "Reselect all SmartList Selections" daily)   Diet/type: tubefeeds per RD - passed swallow screen per SLP but feel her deconditioning precludes her from advancing her diet for now. Restart trickle feeds today, hold if vomiting again DVT prophylaxis: prophylactic heparin  GI prophylaxis: N/A Lines: PIV x2 Foley:  N/A removed 8/27 Code Status:  full code Last date of  multidisciplinary goals of care discussion: with  patient.     Vonna Drafts, MD 07/07/2023, 11:11 AM

## 2023-07-07 NOTE — TOC Initial Note (Signed)
Transition of Care [facility]) - Initial/Assessment Note    Patient Details  Name: Casey [name] MRN: [id number] Date of Birth: [birth year]  [facility]) CM/SW Contact:    Lorri Frederick, LCSW Phone Number: 07/07/2023, 3:44 PM  Clinical Narrative:     CSW spoke with pt for initial assessment.  Pt oriented x4, able to participate in conversation.  Pt lives home with son Jill Alexanders.  No current services.  Permission given to speak with Jill Alexanders and also with pt sister Kriste Basque.  CSW discussed PT recommendation for SNF.  Pt immediately anxious about this, states she does not want to go to SNF.  TOC will continue to follow.                Expected Discharge Plan: Skilled Nursing Facility Barriers to Discharge: Continued Medical Work up   Patient Goals and CMS Choice Patient states their goals for this hospitalization and ongoing recovery are:: be healthy again          Expected Discharge Plan and Services In-house Referral: Clinical Social Work   Post Acute Care Choice:  (TBD) Living arrangements for the past 2 months: Single Family Home Expected Discharge Date: 06/26/23                                    Prior Living Arrangements/Services Living arrangements for the past 2 months: Single Family Home Lives with:: Adult Children (son lives with her) Patient language and need for interpreter reviewed:: Yes Do you feel safe going back to the place where you live?: Yes      Need for Family Participation in Patient Care: No (Comment) Care giver support system in place?: Yes (comment) Current home services: Other (comment) (none) Criminal Activity/Legal Involvement Pertinent to Current Situation/Hospitalization: No - Comment as needed  Activities of Daily Living      Permission Sought/Granted Permission sought to share information with : Family Supports Permission granted to share information with : Yes, Verbal Permission Granted  Share Information with NAME: son  Jill Alexanders, sister Kriste Basque           Emotional Assessment Appearance:: Appears stated age Attitude/Demeanor/Rapport: Engaged Affect (typically observed): Anxious Orientation: : Oriented to Self, Oriented to Place, Oriented to  Time, Oriented to Situation      Admission diagnosis:  Aspiration pneumonia (HCC) [J69.0] Hypoxia [R09.02] Patient Active Problem List   Diagnosis Date Noted   Delirium 07/07/2023   Vomiting without nausea 07/07/2023   COPD with acute exacerbation (HCC) 07/06/2023   Anemia 07/06/2023   Pneumonia of right lower lobe due to infectious organism 07/06/2023   Nausea and vomiting 07/05/2023   Malnutrition of moderate degree 06/29/2023   Acute respiratory failure with hypoxia (HCC) 06/26/2023   Polysubstance abuse (HCC) 06/26/2023   Acute metabolic encephalopathy 06/26/2023   Aspiration pneumonia (HCC) 06/25/2023   Primary insomnia 08/20/2021   Depression, recurrent (HCC) 08/20/2021   GAD (generalized anxiety disorder) 08/20/2021   Elevated blood pressure reading in office without diagnosis of hypertension 08/20/2021   Closed fracture of left proximal humerus 01/01/2020   Fracture of base of fifth metacarpal bone of right hand 10/06/2016   Benzodiazepine dependence, continuous (HCC) 01/09/2016   History of panic attacks 09/11/2014   Malaise 09/03/2014   Fatigue 09/03/2014   Body mass index (BMI) of 20.0-20.9 in adult 09/03/2014   Right clavicle fracture 12/12/2013   PCP:  Patient, No Pcp Per  Pharmacy:   [facility] [address] - [place], [place] - [address] [place] [address] Phone: [phone] Fax: [phone]  [facility] [phone] - [place], [place] - [address] [place] [address] Phone: [phone] Fax: [phone]     Social Determinants of Health (SDOH) Social History: SDOH Screenings   Depression (PHQ2-9): Medium Risk (02/24/2023)  Tobacco Use: High Risk  (05/28/2023)   Received from [facility]   SDOH Interventions:     Readmission Risk Interventions     No data to display

## 2023-07-07 NOTE — Progress Notes (Signed)
   07/07/23 2329  BiPAP/CPAP/SIPAP  BiPAP/CPAP/SIPAP Pt Type Adult  BiPAP/CPAP/SIPAP V60  Mask Type Full face mask  Mask Size Medium  Set Rate 14 breaths/min  Respiratory Rate 31 breaths/min  IPAP 14 cmH20  EPAP 6 cmH2O  FiO2 (%) 60 %  Minute Ventilation 13  Leak 2  Peak Inspiratory Pressure (PIP) 14  Tidal Volume (Vt) 411  Patient Home Equipment No  Auto Titrate No  Press High Alarm 30 cmH2O  Press Low Alarm 5 cmH2O

## 2023-07-08 ENCOUNTER — Inpatient Hospital Stay (HOSPITAL_COMMUNITY): Payer: MEDICAID

## 2023-07-08 DIAGNOSIS — J189 Pneumonia, unspecified organism: Secondary | ICD-10-CM

## 2023-07-08 LAB — BASIC METABOLIC PANEL WITH GFR
Anion gap: 11 (ref 5–15)
BUN: 17 mg/dL (ref 8–23)
CO2: 26 mmol/L (ref 22–32)
Calcium: 8.9 mg/dL (ref 8.9–10.3)
Chloride: 98 mmol/L (ref 98–111)
Creatinine, Ser: 0.4 mg/dL — ABNORMAL LOW (ref 0.44–1.00)
GFR, Estimated: >60 mL/min
Glucose, Bld: 128 mg/dL — ABNORMAL HIGH (ref 70–99)
Potassium: 3.4 mmol/L — ABNORMAL LOW (ref 3.5–5.1)
Sodium: 135 mmol/L (ref 135–145)

## 2023-07-08 LAB — GLUCOSE, CAPILLARY
Glucose-Capillary: 123 mg/dL — ABNORMAL HIGH (ref 70–99)
Glucose-Capillary: 133 mg/dL — ABNORMAL HIGH (ref 70–99)

## 2023-07-08 LAB — CBC
HCT: 33.2 % — ABNORMAL LOW (ref 36.0–46.0)
Hemoglobin: 10.6 g/dL — ABNORMAL LOW (ref 12.0–15.0)
MCH: 27.1 pg (ref 26.0–34.0)
MCHC: 31.9 g/dL (ref 30.0–36.0)
MCV: 84.9 fL (ref 80.0–100.0)
Platelets: 585 10*3/uL — ABNORMAL HIGH (ref 150–400)
RBC: 3.91 MIL/uL (ref 3.87–5.11)
RDW: 14.6 % (ref 11.5–15.5)
WBC: 17.7 10*3/uL — ABNORMAL HIGH (ref 4.0–10.5)
nRBC: 0 % (ref 0.0–0.2)

## 2023-07-08 MED ORDER — CLONAZEPAM 1 MG PO TABS
2.0000 mg | ORAL_TABLET | Freq: Two times a day (BID) | ORAL | Status: DC
  Administered 2023-07-08: 2 mg
  Filled 2023-07-08: qty 2

## 2023-07-08 MED ORDER — SODIUM CHLORIDE 0.9 % IV SOLN
3.0000 g | Freq: Four times a day (QID) | INTRAVENOUS | Status: DC
  Administered 2023-07-08 – 2023-07-12 (×17): 3 g via INTRAVENOUS
  Filled 2023-07-08 (×16): qty 8

## 2023-07-08 MED ORDER — BETHANECHOL CHLORIDE 10 MG PO TABS
5.0000 mg | ORAL_TABLET | Freq: Three times a day (TID) | ORAL | Status: DC
  Administered 2023-07-08 (×2): 5 mg
  Filled 2023-07-08 (×2): qty 1

## 2023-07-08 MED ORDER — BETHANECHOL CHLORIDE 10 MG PO TABS
10.0000 mg | ORAL_TABLET | Freq: Three times a day (TID) | ORAL | Status: DC
  Administered 2023-07-08: 10 mg
  Filled 2023-07-08: qty 1

## 2023-07-08 MED ORDER — POTASSIUM CHLORIDE 20 MEQ PO PACK
40.0000 meq | PACK | Freq: Once | ORAL | Status: AC
  Administered 2023-07-08: 40 meq
  Filled 2023-07-08: qty 2

## 2023-07-08 NOTE — Progress Notes (Signed)
[facility] ADULT ICU REPLACEMENT PROTOCOL   The patient does apply for the [facility] Adult ICU Electrolyte Replacment Protocol based on the criteria listed below:   1.Exclusion criteria: TCTS, ECMO, Dialysis, and Myasthenia Gravis patients 2. Is GFR >/= 30 ml/min? Yes.    Patient's GFR today is >60 3. Is SCr </= 2? Yes.   Patient's SCr is 0.40 mg/dL 4. Did SCr increase >/= 0.5 in 24 hours? No. 5.Pt's weight >40kg  Yes.   6. Abnormal electrolyte(s): K+ = 3.4  7. Electrolytes replaced per protocol 8.  Call MD STAT for K+ </= 2.5, Phos </= 1, or Mag </= 1 Physician:  Delia Chimes, eMD  Faye Ramsay 07/08/2023 4:27 AM

## 2023-07-08 NOTE — Progress Notes (Signed)
eLink Physician-Brief Progress Note Patient Name: Casey [name] DOB: 01-06-1961 MRN: [id number]   Date of Service  07/08/2023  HPI/Events of Note  62 year old female was admitted with acute encephalopathy and pneumonia) she has been having urinary retention and went to catheterizations yesterday, had 1 spontaneous void 2 AM and is again complaining of discomfort and urinary retention.  Scan shows 520 cc of urine.  eICU Interventions  Added on post urinary catheter order set for retention  Maintain in/out catheterization for 24 hours as needed.     Intervention Category Minor Interventions: Routine modifications to care plan (e.g. PRN medications for pain, fever)  Deforrest Bogle 07/08/2023, 6:25 AM

## 2023-07-08 NOTE — Progress Notes (Signed)
NAME:  Casey [name], MRN:  [id number], DOB:  09-24-1961, LOS: 12 ADMISSION DATE:  06/25/2023 CONSULTATION DATE:  06/26/2023 REFERRING MD:  Lyn Hollingshead - TRH ([facility]) CHIEF COMPLAINT:  AMS, SOB  History of Present Illness:  62 year old woman who presented to [facility] 8/17 as a transfer from [facility] ICU for AMS, hypoxia requiring BiPAP and possible aspiration PNA. PMHx significant for chronic pain, anxiety/depression, panic disorder, suicide attempt, polysubstance abuse.  Patient initially presented to [facility] ED 8/16 after being found at home unresponsive by family. EMS was called, administered Narcan 2mg  IN with improvement in mental status. Per chart review "possible took two Xanax that weren't Xanax". Patient initially refused transport to hospital but eventually agreed. Drowsy with garbled speech with EMS but able to follow commands. Hypoxic to 86% on RA, initially placed on [place] but escalated to NRB with improvement in SpO2 to 96%. In ED, patient was hypothermic, tachycardic to 100s, BP 139/95, RR 38, SpO2 89% on NRB. Labs were notable for WBC 10.6, Hgb 13.2, Plt 414. Na 136, K 3.5, CO2 22, Cr 0.76, LFTs WNL. UA unremarkable. Ethanol and APAP levels negative. UDS+ cocaine, BZD and THC. CXR demonstrated asymmetric bibasilar interstitial and airspace infiltrates c/f multifocal PNA/aspiration. Unasyn ordered in ED but changed to ceftriaxone due to ?allergy. Patient was subsequently admitted to [facility]  On 8/17, patient was noted to have persistent respiratory distress with escalating O2 requirements to HFNC and eventually BiPAP. CXR appeared worse with rapid progression of diffuse bilateral heterogeneous consolidative airspace disease in LUL and R lung, c/w worsened multifocal PNA and small layering R pleural effusion.   PCCM consulted for transfer to [facility] for further care.  Pertinent Medical History:   Past Medical History:  Diagnosis Date   Anxiety    Chronic back pain    Depression    History of panic attacks  09/11/2014   Medical history non-contributory    Polysubstance abuse (HCC)    History of taking non-prescribed opiates, BZDs; also +cocaine and THC   Ruptured disc, thoracic    Suicide attempt (HCC) 2002   Significant Hospital Events: Including procedures, antibiotic start and stop dates in addition to other pertinent events   8/16 - Presented to [facility] via EMS for AMS, hypoxia. Presumed drug intoxication. UDS +cocaine, BZDs, THC. CXR with multifocal PNA. Required [place] then NRB then HFNC. Empiric ceftriaxone. 8/17 - Worsening respiratory status requiring escalation to BiPAP. ABG poor. CXR worse. Transferred to [facility] for further care. 8/18: Opt-flow 8/20: placed on BiPAP overnight. Continued labored breathing in AM, subsequently intubated. Switched abx to ceftriaxone and vancomycin. Dc'd steroids. 8/21: continued agitation on precedex/fentanyl. Started on propofol gtt. Started on seroquel 8/22: DC antibiotics. off precedex. Weaning prop/fent 8/24 increased Seroquel.  Reattempting Precedex trial.  Was apneic on propofol and fentanyl infusion 8/25 on fent overnight in addition to precedex. Today much better after seroquel adjustment and diuresis. Favorable SBI so extubated to heated high flow.  8/26 remains on HFNC. On precedex overnight but off in AM. Lasix x1. In afternoon, fell from bed - minor abrasions but no significant injury. BiPAP overnight 8/27 DC PICC and foley. Continues on HFNC 8/28 improving overall. Try to wean HFNC and treat coughing/vomiting. 8/29 started unasyn for persistent sputum production. Remains on HFNC. Starting to wean klonopin  Interim History / Subjective:  Used Bipap overnight then requested to be taken off, placed back on HFNC. Found to have urinary retention - bladder scan 520cc - got in/out catheterization, started on bethanechol. Received  Kcl for K 3.4. Feels her breathing is improved.  Objective:  Blood pressure (!) 163/107, pulse (!) 130, temperature 97.6 F (36.4  C), temperature source Oral, resp. rate (!) 26, height 5\' 5"  (1.651 m), weight 61 kg, SpO2 95%.    FiO2 (%):  [60 %] 60 %   Intake/Output Summary (Last 24 hours) at 07/08/2023 1024 Last data filed at 07/08/2023 1000 Gross per 24 hour  Intake 1043.67 ml  Output 1175 ml  Net -131.33 ml   Filed Weights   07/04/23 0500 07/05/23 0443 07/08/23 0500  Weight: 67.7 kg 62 kg 61 kg   Physical Examination:  Gen: NAD sitting in chair, chronically ill, appears weak and deconditioned HEENT: Upper lip with healing abrasions, no bleeding. R upper front incisor loose. L upper front incisor chipped. Otherwise [place]/AT. HFNC in place CV: RRR no murmurs appreciated Pulm: tachypneic upper 20s, CTAB. Comfortable WOB on HFNC. Thick sputum. Speaking in complete sentences. Abd: Soft, NT/ND Neuro: Awake, alert, appropriately responsive.   Ext: No significant BLE edema     Latest Ref Rng & Units 07/08/2023    3:02 AM 07/07/2023    9:10 AM 07/06/2023    4:01 AM  CBC  WBC 4.0 - 10.5 K/uL 17.7  22.4  23.8   Hemoglobin 12.0 - 15.0 g/dL 13.2  44.0  10.2   Hematocrit 36.0 - 46.0 % 33.2  35.7  33.2   Platelets 150 - 400 K/uL 585  585  417       Latest Ref Rng & Units 07/08/2023    3:02 AM 07/07/2023    9:10 AM 07/06/2023    4:01 AM  BMP  Glucose 70 - 99 mg/dL 725  366  440   BUN 8 - 23 mg/dL 17  17  14    Creatinine 0.44 - 1.00 mg/dL 3.47  4.25  9.56   Sodium 135 - 145 mmol/L 135  135  136   Potassium 3.5 - 5.1 mmol/L 3.4  3.6  3.5   Chloride 98 - 111 mmol/L 98  98  98   CO2 22 - 32 mmol/L 26  25  27    Calcium 8.9 - 10.3 mg/dL 8.9  9.2  8.6      Resolved Hospital Problem List:   Assessment & Plan:  Acute hypoxemic respiratory failure in the setting of multifocal PNA (presumed aspiration) Leukocytosis with neutrophilia, without fever Deconditioning Ongoing yet improving respiratory failure in the setting of agitation, deconditioning, and volume overload 2/2 aspiration. Extubated 8/25, now stable on HFNC.  Suspect she is high risk for reintubation given her significant deconditioning and risk of aspiration with post tussive vomiting. Leukocytosis improving at 17.7 today, unclear etiology of this given no recent steroids, afebrile, s/p antibiotic course for PNA. PICC and Foley removed 8/27. Continues to have cough productive of thick sputum, concerning for persistent pna vs bronchitis Plan Collect repeat sputum culture, start Unasyn given continued sputum production with leukocytosis Obtain CXR Continue HFNC wean as tolerated Manage cough and vomiting to prevent aspiration - phenergan and zofran, phenol spray 8/24 cultures NGTD Monitor CBC and fever curve Likely recollect sputum cx and resume abx if febrile Appreciate PT/OT/SLP Chest PT   Acute metabolic encephalopathy secondary to sepsis, and prolonged critical illness complicated further by GAD w/ h/o panic disorder, depression, Chronic pain c/b Polysubstance abuse -UDS +cocaine, BZDs, THC. History of taking non-prescribed opiates/BZDs per family (daily xanax off street) Continued though improving impulsive behavior, would still benefit from ICU care for  close monitoring Plan Decrease Klonopin today Continuing Celexa, Seroquel as well as scheduled oxycodone. Seroquel increased to 100mg  bid on 8/24.  Off precedex gtt Off fent gtt DC'd robaxin and PRN versed Maximize nonopioid adjuncts Cessation education when appropriate  Urinary retention Bladder scan 520cc overnight Has been requiring in/out catheterizations -decrease bethanechol to 5mg  -Continue to monitor  Vomiting - post tussive, benign abd exam -KUB unremarkable -phenergan cough syrup (Qtc normal) -PRN zofran -TF per RD  Fall 8/26 with dental injury Slightly loose incisor and chipped tooth on exam, otherwise no significant injuries aside from abrasions -outpatient dental follow up  Hyperglycemia Stable, off steroids   Anemia 2/2 acute illness Stable, continue to  monitor Transfuse for hgb <7  Best Practice: (right click and "Reselect all SmartList Selections" daily)   Diet/type: Regular consistency (see orders)  DVT prophylaxis: prophylactic heparin  GI prophylaxis: N/A Lines: PIV x2, PICC removed 8/27 Foley:  N/A removed 8/27 Code Status:  full code Last date of multidisciplinary goals of care discussion: with  patient.     Vonna Drafts, MD 07/08/2023, 10:24 AM

## 2023-07-08 NOTE — Progress Notes (Addendum)
Family member brought patient 2 twenty dollar bills. I ask pt and family to take money back home that she did not need money while in the hospital  Update: family states that she gave her the money back, 2 twenty dollar bills

## 2023-07-08 NOTE — Progress Notes (Addendum)
   Inpatient Rehab Admissions Coordinator :  Per therapy recommendations patient was screened for CIR candidacy by Ottie Glazier RN MSN. Patient is not at a level to tolerate the intensity required to pursue a CIR admit, as well as noted that she no longer has assistance at home. Recommend other rehab venues to be pursued.  Please contact me with any questions.  Ottie Glazier RN MSN Admissions Coordinator [phone]   [facility] RN CM and SW made aware of recommendations on 8/30.  Ottie Glazier, RN, MSN Rehab Admissions Coordinator [phone] 07/09/2023 1:07 PM

## 2023-07-08 NOTE — Progress Notes (Signed)
Pharmacy Antibiotic Note  Casey [name] is a 62 y.o. female admitted on 06/25/2023 with pneumonia.  Pharmacy has been consulted for unasyn dosing.  Afebrile, WBC 17.7 elevated, down trending Scr 0.4, down trending Productive cough, purulent sputum per MD   Plan: Initiate unasyn 3g IV q6h Monitor renal function and signs of clinical improvement  Height: 5\' 5"  (165.1 cm) Weight: 61 kg (134 lb 7.7 oz) IBW/kg (Calculated) : 57  Temp (24hrs), Avg:98.1 F (36.7 C), Min:97.6 F (36.4 C), Max:98.9 F (37.2 C)  Recent Labs  Lab 07/04/23 0616 07/05/23 0558 07/06/23 0401 07/07/23 0910 07/08/23 0302  WBC 10.7* 18.5* 23.8* 22.4* 17.7*  CREATININE 0.39* 0.71 0.57 0.60 0.40*    Estimated Creatinine Clearance: 63.9 mL/min (A) (by C-G formula based on SCr of 0.4 mg/dL (L)).    Allergies  Allergen Reactions   Ampicillin Other (See Comments)    itching    Antimicrobials this admission: Vanc 8/17 >8/20 Cefepime 8/17 >8/20 Rocephin x1 8/17, 8/20>>8/22   Microbiology results: 8/17 MRSA - positive 8/17 strep pneumo neg 8/17 legionella - neg  COVID, RVP: neg 8/19 Bcx: negative 8/22 resp TA: yeast 8/24 rsp TA: negative 8/29 sputum cx: sent   Thank you for allowing pharmacy to be a part of this patient's care.   Stephenie Acres, PharmD PGY1 Pharmacy Resident 07/08/2023 9:19 AM

## 2023-07-08 NOTE — Progress Notes (Signed)
   07/08/23 2314  BiPAP/CPAP/SIPAP  $ Non-Invasive Ventilator  Non-Invasive Vent Subsequent  BiPAP/CPAP/SIPAP Pt Type Adult  BiPAP/CPAP/SIPAP V60  Mask Type Full face mask  Mask Size Medium  Set Rate 14 breaths/min  Respiratory Rate 28 breaths/min  IPAP 14 cmH20  EPAP 6 cmH2O  PEEP 6 cmH20  FiO2 (%) 60 %  Minute Ventilation 10.9  Leak 0  Peak Inspiratory Pressure (PIP) 14  Tidal Volume (Vt) 373  Patient Home Equipment No  Auto Titrate No  Press High Alarm 30 cmH2O  Press Low Alarm 5 cmH2O  Nasal massage performed Yes  CPAP/SIPAP surface wiped down Yes  BiPAP/CPAP /SiPAP Vitals  BP 104/63  MEWS Score/Color  MEWS Score 2  MEWS Score Color Yellow

## 2023-07-08 NOTE — Progress Notes (Signed)
Physical Therapy Treatment Patient Details Name: Casey [name] MRN: [id number] DOB: 05/26/1961 Today's Date: 07/08/2023   History of Present Illness 62 yo female transferred to [facility] 8/17 from [facility] with AMS (presumed drug intoxication, UDS +cocaine/BZD/THC) and multifocal PNA, possible aspiration. Intubated  8/20-8/25. Tenuous with persistent tachypnea, cough with shallow breathing on HHFNC/Bipap post extubation. PMH: chronic pain, anxiety/depression, panic disorder, suicide attempt, polysubstance abuse.    PT Comments  Pt pleasant with some confusion and able to progress to gait this session on NRB. Pt with significantly improved activity tolerance with SPO2>95% throughout and returned to [facility] at rest 20L/60%. Pt encouraged to be OOB with staff, continue HEP and updated D/C plan given progression. Pt did state she no longer has assist at D/C as son is currently in jail.   HR 120     If plan is discharge home, recommend the following: Assistance with cooking/housework;Direct supervision/assist for medications management;Assist for transportation;Help with stairs or ramp for entrance;A little help with walking and/or transfers;A little help with bathing/dressing/bathroom   Can travel by private vehicle     Yes  Equipment Recommendations  Rolling walker (2 wheels);BSC/3in1    Recommendations for Other Services       Precautions / Restrictions Precautions Precautions: Fall;Other (comment) Precaution Comments: HHFNC at rest 20L/60%, NRB with gait     Mobility  Bed Mobility Overal bed mobility: Needs Assistance Bed Mobility: Supine to Sit     Supine to sit: Supervision, Used rails     General bed mobility comments: supervision with mod cues for safety as pt trying to get OOB before rail moved, increased time and reliance on rail    Transfers Overall transfer level: Needs assistance   Transfers: Sit to/from Stand Sit to Stand: Min assist           General transfer  comment: min assist to rise from bed and recliner with posterior bias, cues for hand placement and safety. Performed 10 repeated STS from chair with use of hands    Ambulation/Gait Ambulation/Gait assistance: Min assist Gait Distance (Feet): 150 Feet Assistive device: Rolling walker (2 wheels) Gait Pattern/deviations: Step-through pattern, Decreased stride length, Trunk flexed   Gait velocity interpretation: 1.31 - 2.62 ft/sec, indicative of limited community ambulator   General Gait Details: cues for posture, safety and use of RW. pt needing min assist for balance. maintained 96% on 15L NRB   Stairs             Wheelchair Mobility     Tilt Bed    Modified Rankin (Stroke Patients Only)       Balance Overall balance assessment: Needs assistance, History of Falls Sitting-balance support: Feet supported, No upper extremity supported Sitting balance-Leahy Scale: Fair     Standing balance support: Bilateral upper extremity supported, Reliant on assistive device for balance Standing balance-Leahy Scale: Poor Standing balance comment: RW and therapist support                            Cognition Arousal: Alert Behavior During Therapy: WFL for tasks assessed/performed Overall Cognitive Status: Impaired/Different from baseline Area of Impairment: Awareness, Following commands, Problem solving, Orientation                 Orientation Level: Disoriented to, Time   Memory: Decreased short-term memory Following Commands: Follows one step commands consistently Safety/Judgement: Decreased awareness of deficits   Problem Solving: Slow processing  Exercises General Exercises - Lower Extremity Long Arc Quad: AROM, Both, Seated, 20 reps, Strengthening Hip Flexion/Marching: AROM, Both, Seated, 20 reps, Strengthening    General Comments        Pertinent Vitals/Pain Pain Assessment Pain Assessment: No/denies pain    Home Living                           Prior Function            PT Goals (current goals can now be found in the care plan section) Progress towards PT goals: Progressing toward goals    Frequency    Min 1X/week      PT Plan      Co-evaluation              AM-PAC PT "6 Clicks" Mobility   Outcome Measure  Help needed turning from your back to your side while in a flat bed without using bedrails?: A Little Help needed moving from lying on your back to sitting on the side of a flat bed without using bedrails?: A Little Help needed moving to and from a bed to a chair (including a wheelchair)?: A Little Help needed standing up from a chair using your arms (e.g., wheelchair or bedside chair)?: A Little Help needed to walk in hospital room?: A Little Help needed climbing 3-5 steps with a railing? : A Lot 6 Click Score: 17    End of Session Equipment Utilized During Treatment: Oxygen;Gait belt Activity Tolerance: Patient tolerated treatment well Patient left: in chair;with call bell/phone within reach;with chair alarm set;with restraints reapplied Nurse Communication: Mobility status PT Visit Diagnosis: Other abnormalities of gait and mobility (R26.89);Muscle weakness (generalized) (M62.81)     Time: 8295-6213 PT Time Calculation (min) (ACUTE ONLY): 24 min  Charges:    $Gait Training: 8-22 mins $Therapeutic Exercise: 8-22 mins PT General Charges $$ ACUTE PT VISIT: 1 Visit                     Merryl Hacker, PT Acute Rehabilitation Services Office: [phone]    Cristine Polio 07/08/2023, 9:36 AM

## 2023-07-08 NOTE — Progress Notes (Signed)
Pt requesting to be taken off of bipap at this time, pt placed back on heated HFNC at this time.

## 2023-07-08 NOTE — Progress Notes (Signed)
SLP Cancellation Note  Patient Details Name: Casey [name] MRN: [id number] DOB: 1960-12-07   Cancelled treatment:       Reason Eval/Treat Not Completed: Patient declined, no reason specified;Fatigue/lethargy limiting ability to participate;family member requesting SLP return when pt has rested.  ST will continue efforts as able to progress diet/complete repeat MBS.   Pat Zarius Furr,M.S., CCC-SLP 07/08/2023, 1:16 PM

## 2023-07-09 ENCOUNTER — Inpatient Hospital Stay (HOSPITAL_COMMUNITY): Payer: MEDICAID

## 2023-07-09 LAB — CBC
HCT: 33.9 % — ABNORMAL LOW (ref 36.0–46.0)
Hemoglobin: 11 g/dL — ABNORMAL LOW (ref 12.0–15.0)
MCH: 27.4 pg (ref 26.0–34.0)
MCHC: 32.4 g/dL (ref 30.0–36.0)
MCV: 84.5 fL (ref 80.0–100.0)
Platelets: 665 10*3/uL — ABNORMAL HIGH (ref 150–400)
RBC: 4.01 MIL/uL (ref 3.87–5.11)
RDW: 14.6 % (ref 11.5–15.5)
WBC: 16.5 10*3/uL — ABNORMAL HIGH (ref 4.0–10.5)
nRBC: 0 % (ref 0.0–0.2)

## 2023-07-09 LAB — BASIC METABOLIC PANEL
Anion gap: 10 (ref 5–15)
BUN: 14 mg/dL (ref 8–23)
CO2: 28 mmol/L (ref 22–32)
Calcium: 9 mg/dL (ref 8.9–10.3)
Chloride: 98 mmol/L (ref 98–111)
Creatinine, Ser: 0.46 mg/dL (ref 0.44–1.00)
GFR, Estimated: >60 mL/min (ref >60)
Glucose, Bld: 127 mg/dL — ABNORMAL HIGH (ref 70–99)
Potassium: 4.3 mmol/L (ref 3.5–5.1)
Sodium: 136 mmol/L (ref 135–145)

## 2023-07-09 MED ORDER — CITALOPRAM HYDROBROMIDE 20 MG PO TABS
20.0000 mg | ORAL_TABLET | Freq: Every day | ORAL | Status: DC
  Administered 2023-07-10 – 2023-07-12 (×3): 20 mg
  Filled 2023-07-09 (×3): qty 1

## 2023-07-09 MED ORDER — ADULT MULTIVITAMIN W/MINERALS CH
1.0000 | ORAL_TABLET | Freq: Every day | ORAL | Status: DC
  Administered 2023-07-09 – 2023-07-12 (×4): 1 via ORAL
  Filled 2023-07-09 (×4): qty 1

## 2023-07-09 MED ORDER — OXYCODONE HCL 5 MG PO TABS
10.0000 mg | ORAL_TABLET | Freq: Four times a day (QID) | ORAL | Status: DC
  Administered 2023-07-09: 10 mg via ORAL
  Filled 2023-07-09: qty 2

## 2023-07-09 MED ORDER — BETHANECHOL CHLORIDE 5 MG PO TABS
5.0000 mg | ORAL_TABLET | Freq: Three times a day (TID) | ORAL | Status: DC
  Administered 2023-07-09 – 2023-07-10 (×4): 5 mg via ORAL
  Filled 2023-07-09 (×4): qty 1

## 2023-07-09 MED ORDER — CITALOPRAM HYDROBROMIDE 20 MG PO TABS
20.0000 mg | ORAL_TABLET | Freq: Every day | ORAL | Status: DC
  Filled 2023-07-09: qty 1

## 2023-07-09 MED ORDER — ACETAMINOPHEN 650 MG RE SUPP: 650.0000 mg | Freq: Four times a day (QID) | RECTAL | Status: DC | PRN

## 2023-07-09 MED ORDER — BETHANECHOL CHLORIDE 10 MG PO TABS: 5.0000 mg | ORAL_TABLET | Freq: Three times a day (TID) | ORAL | Status: DC

## 2023-07-09 MED ORDER — OXYCODONE HCL 5 MG PO TABS
5.0000 mg | ORAL_TABLET | Freq: Four times a day (QID) | ORAL | Status: DC | PRN
  Administered 2023-07-10 – 2023-07-12 (×6): 5 mg via ORAL
  Filled 2023-07-09 (×6): qty 1

## 2023-07-09 MED ORDER — ACETAMINOPHEN 325 MG PO TABS: 650.0000 mg | ORAL_TABLET | Freq: Four times a day (QID) | ORAL | Status: DC | PRN

## 2023-07-09 MED ORDER — QUETIAPINE FUMARATE 100 MG PO TABS: 100.0000 mg | ORAL_TABLET | Freq: Two times a day (BID) | ORAL | Status: DC

## 2023-07-09 MED ORDER — HYDROXYZINE HCL 25 MG PO TABS
50.0000 mg | ORAL_TABLET | Freq: Three times a day (TID) | ORAL | Status: DC | PRN
  Administered 2023-07-10 (×2): 50 mg via ORAL
  Filled 2023-07-09 (×2): qty 2

## 2023-07-09 MED ORDER — CLONAZEPAM 1 MG PO TABS: 2.0000 mg | ORAL_TABLET | Freq: Two times a day (BID) | ORAL | Status: DC

## 2023-07-09 MED ORDER — GUAIFENESIN 100 MG/5ML PO LIQD
15.0000 mL | Freq: Four times a day (QID) | ORAL | Status: DC
  Administered 2023-07-09 – 2023-07-12 (×14): 15 mL via ORAL
  Filled 2023-07-09 (×14): qty 15

## 2023-07-09 MED ORDER — ONDANSETRON HCL 4 MG PO TABS: 4.0000 mg | ORAL_TABLET | Freq: Four times a day (QID) | ORAL | Status: DC | PRN

## 2023-07-09 MED ORDER — CLONAZEPAM 0.5 MG PO TABS
1.0000 mg | ORAL_TABLET | Freq: Two times a day (BID) | ORAL | Status: DC
  Administered 2023-07-09 – 2023-07-10 (×3): 1 mg via ORAL
  Filled 2023-07-09: qty 1
  Filled 2023-07-09 (×2): qty 2

## 2023-07-09 MED ORDER — ONDANSETRON HCL 4 MG/2ML IJ SOLN: 4.0000 mg | Freq: Four times a day (QID) | INTRAMUSCULAR | Status: DC | PRN

## 2023-07-09 MED ORDER — QUETIAPINE FUMARATE 50 MG PO TABS
50.0000 mg | ORAL_TABLET | Freq: Two times a day (BID) | ORAL | Status: DC
  Administered 2023-07-09 – 2023-07-10 (×3): 50 mg via ORAL
  Filled 2023-07-09 (×3): qty 1

## 2023-07-09 MED ORDER — PROMETHAZINE HCL 6.25 MG/5ML PO SOLN: 12.5000 mg | Freq: Four times a day (QID) | ORAL | Status: DC | PRN

## 2023-07-09 NOTE — Progress Notes (Signed)
eLink Physician-Brief Progress Note Patient Name: SHEROLYN PEARSON DOB: [birth year] MRN: [id number]   Date of Service  07/09/2023  HPI/Events of Note  Ms. Honts is a 62 y/o woman with a history of anxiety, former tobacco abuse. She was found unconscious by family and brought to the hospital.  Ongoing agitation requiring restraints  eICU Interventions  Renew Posey belt     Intervention Category Minor Interventions: Agitation / anxiety - evaluation and management  Bryston Colocho 07/09/2023, 12:16 AM

## 2023-07-09 NOTE — TOC Progression Note (Signed)
Transition of Care [facility]) - Progression Note    Patient Details  Name: Casey [name] MRN: [id number] Date of Birth: [birth year]  Transition of Care [facility]) CM/SW Contact  Mearl Latin, LCSW Phone Number: 07/09/2023, 2:42 PM  Clinical Narrative:    TOC continuing to follow. If patient does not have home support, will send out SNF referral once oxygen requirements are less. SNF placement barriers include substance use and insurance.    Expected Discharge Plan: Skilled Nursing Facility Barriers to Discharge: Continued Medical Work up  Expected Discharge Plan and Services In-house Referral: Clinical Social Work   Post Acute Care Choice:  (TBD) Living arrangements for the past 2 months: Single Family Home Expected Discharge Date: 06/26/23                                     Social Determinants of Health (SDOH) Interventions SDOH Screenings   Depression (PHQ2-9): Medium Risk (02/24/2023)  Tobacco Use: High Risk (05/28/2023)   Received from [facility]    Readmission Risk Interventions     No data to display

## 2023-07-09 NOTE — Progress Notes (Signed)
Occupational Therapy Treatment Patient Details Name: CHARLIEE KRANICH MRN: [id number] DOB: 1961/03/19 Today's Date: 07/09/2023   History of present illness 62 yo female transferred to [facility] 8/17 from [facility] with AMS (presumed drug intoxication, UDS +cocaine/BZD/THC) and multifocal PNA, possible aspiration. Intubated  8/20-8/25. Tenuous with persistent tachypnea, cough with shallow breathing on HHFNC/Bipap post extubation. PMH: chronic pain, anxiety/depression, panic disorder, suicide attempt, polysubstance abuse.   OT comments  Pt progressing toward established OT goals. Challenging activity tolerance with LB ADL, OOB mobility, and standing grooming. Pt needing intermittent cueing for safety and sequencing throughout. Observed with posterior bias on standing needing min A constantly in standing and up to mod A in standing while performing oral care. Pt HR up to 133, SpO2 as low as 80, and RR up to 30s during mobility and pt needing up to 12L HFNC as well as cues for pursed lip breathing and initiation of rest breaks. Due to continued need for assist continuing to recommend intensive multidisciplinary rehabilitation >3 hours/day to optimize safety and independence in ADL.        If plan is discharge home, recommend the following:  A lot of help with bathing/dressing/bathroom;A little help with walking and/or transfers;Assistance with cooking/housework;Direct supervision/assist for medications management;Direct supervision/assist for financial management;Assist for transportation;Help with stairs or ramp for entrance   Equipment Recommendations  None recommended by OT (defer)    Recommendations for Other Services Rehab consult    Precautions / Restrictions Precautions Precautions: Fall;Other (comment) Precaution Comments: HFNC Restrictions Weight Bearing Restrictions: No       Mobility Bed Mobility Overal bed mobility: Needs Assistance Bed Mobility: Supine to Sit       Sit to supine: Min  assist   General bed mobility comments: min A for guidance and cues for sequencing/safety    Transfers Overall transfer level: Needs assistance Equipment used: Rolling walker (2 wheels) Transfers: Sit to/from Stand Sit to Stand: Min assist           General transfer comment: MIn A to rise with posterior bias     Balance Overall balance assessment: Needs assistance, History of Falls Sitting-balance support: Feet supported, No upper extremity supported Sitting balance-Leahy Scale: Fair     Standing balance support: Bilateral upper extremity supported, Reliant on assistive device for balance Standing balance-Leahy Scale: Poor Standing balance comment: RW and therapist support                           ADL either performed or assessed with clinical judgement   ADL Overall ADL's : Needs assistance/impaired     Grooming: Minimal assistance;Moderate assistance;Standing;Oral care Grooming Details (indicate cue type and reason): min progressing to mod A for standing balance. RN present and provided set up of toothbrush. SpO2 as low as 82 with oral care in standing requiring one seated rest break.             Lower Body Dressing: Contact guard assist;Bed level Lower Body Dressing Details (indicate cue type and reason): to don socks. Significantly increased time and 1 rest break due to decresaed activity tolerance. SpO2 as low as 80 Toilet Transfer: Minimal assistance;Stand-pivot;BSC/3in1;Rolling walker (2 wheels) Toilet Transfer Details (indicate cue type and reason): simulated to chair         Functional mobility during ADLs: Minimal assistance;Rolling walker (2 wheels) General ADL Comments: limited by generalized weakness, fatigue, decreased activity tolerance, and O2 demands    Extremity/Trunk Assessment Upper Extremity Assessment Upper  Extremity Assessment: Right hand dominant;Generalized weakness   Lower Extremity Assessment Lower Extremity Assessment:  Defer to PT evaluation        Vision   Vision Assessment?: No apparent visual deficits   Perception Perception Perception: Not tested   Praxis      Cognition Arousal: Alert Behavior During Therapy: WFL for tasks assessed/performed Overall Cognitive Status: Impaired/Different from baseline Area of Impairment: Awareness, Following commands, Problem solving, Orientation, Safety/judgement                 Orientation Level: Disoriented to, Time (date)   Memory: Decreased short-term memory Following Commands: Follows one step commands consistently Safety/Judgement: Decreased awareness of deficits Awareness: Emergent Problem Solving: Slow processing General Comments: requires increased time for processing, and follows commands consistently but with incresaed time. Improving awareness overall reporting she was tired due to her medication, but poor safety due to reporting she could choose not to take it if she knew therapy was coming rather than attempting perform therapy at a different time of day.        Exercises      Shoulder Instructions       General Comments HFNC 10L; RN bumping up to 12L during transfer. SpO2 as low as 80 during functional mobility and ADL requiring intermittent rest breaks. Needing min cues to initiate rest breaks appropriately as well as for breathing through nose. HR up to 133 with donning socks and 125 with transfers.  Pt BP 129/70 (87) EOB, 134/80 (96) standing; standing after seated rest break; 137/76 (94) sititng in chair    Pertinent Vitals/ Pain       Pain Assessment Pain Assessment: Faces Faces Pain Scale: Hurts little more Pain Location: back Pain Descriptors / Indicators: Aching Pain Intervention(s): Limited activity within patient's tolerance, Monitored during session  Home Living                                          Prior Functioning/Environment              Frequency  Min 1X/week        Progress  Toward Goals  OT Goals(current goals can now be found in the care plan section)  Progress towards OT goals: Progressing toward goals  Acute Rehab OT Goals Patient Stated Goal: get better OT Goal Formulation: With patient Time For Goal Achievement: 07/20/23 Potential to Achieve Goals: Good ADL Goals Pt Will Perform Grooming: with supervision;standing Pt Will Perform Lower Body Dressing: with contact guard assist;sit to/from stand Pt Will Transfer to Toilet: with supervision;ambulating Additional ADL Goal #1: Pt will tolerate at lesat 8 minutes of standing funcitonal activity to improve enduracne for ADLs Additional ADL Goal #2: Pt will follow 3 step directional tasks with minimal cues in perparation for IADLs  Plan      Co-evaluation                 AM-PAC OT "6 Clicks" Daily Activity     Outcome Measure   Help from another person eating meals?: A Little Help from another person taking care of personal grooming?: A Little Help from another person toileting, which includes using toliet, bedpan, or urinal?: A Lot Help from another person bathing (including washing, rinsing, drying)?: A Lot Help from another person to put on and taking off regular upper body clothing?: A Little Help from another person to put on  and taking off regular lower body clothing?: A Lot 6 Click Score: 15    End of Session Equipment Utilized During Treatment: Oxygen;Rolling walker (2 wheels);Gait belt  OT Visit Diagnosis: Unsteadiness on feet (R26.81);Other abnormalities of gait and mobility (R26.89);Muscle weakness (generalized) (M62.81)   Activity Tolerance Patient tolerated treatment well   Patient Left with call bell/phone within reach;in chair;with chair alarm set   Nurse Communication Mobility status        Time: 1610-9604 OT Time Calculation (min): 25 min  Charges: OT General Charges $OT Visit: 1 Visit OT Treatments $Self Care/Home Management : 23-37 mins  Tyler Deis,  OTR/L [facility] Acute Rehabilitation Office: [phone]   Myrla Halsted 07/09/2023, 1:44 PM

## 2023-07-09 NOTE — Progress Notes (Signed)
SLP Cancellation Note  Patient Details Name: Casey [name] MRN: [id number] DOB: 1961/05/04   Cancelled treatment:       Reason Eval/Treat Not Completed: Other (comment) Attempted MBS x2. Scheduled at 1pm and at 2pm; both attempts cancelled due to transport issues. Ultimately window with radiology lost. Will f/u.    Brandin Stetzer, Riley Nearing 07/09/2023, 2:39 PM

## 2023-07-09 NOTE — Progress Notes (Signed)
Speech Language Pathology Treatment: Dysphagia  Patient Details Name: Casey [name] MRN: [id number] DOB: 1960-11-11 Today's Date: 07/09/2023 Time: 9528-4132 SLP Time Calculation (min) (ACUTE ONLY): 10 min  Assessment / Plan / Recommendation Clinical Impression  Pt seen for ongoing dysphagia management.  Pt reports good PO intake. Pt just switched of of HHFNC this morning. She consumed nectar thick/mildly thick liquid and regular texture solids with no clinical s/s of aspiration.  Pt with baseline congested cough heard outside of room, but did not occur during PO trials today.  Chest imaging 8/29 with concern for worsening pneumonia.  Pt reports distaste for NTL.  Recommend instrumental prior to advancement given changes on CXR. Will plan for MBSS this afternoon.  Pt reports some difficulty chewing with loose front tooth and requests softer food in smaller pieces.  Will change diet to mechanical soft, but pt is safe to advance back to regular if she prefers.    Recommend mechanical soft solids with nectar thick liquids, pending results of MBS later this date.   HPI HPI: Casey [name] is a 62 yo female who presented to [facility] ED 8/16 after being found at home unresponsive. Per note, pt's son administered "at home narcan", which is vinegar, while pt was unresponsive. EMS administered Narcan with improvement in mental status. In ED, was hypothermic, tachycardic, SpO2 89% on NRB. UDS + for cocaine, BZD, and THC. CXR with asymmetric bibasilar interstitial and airspace infiltrates c/f multifocal PNA/aspiration. On 8/17, pt with persistent respiratory distress with escalating O2 requirements to HFNC and eventually BiPAP and worsening CXR with rapid progression of bilateral heterogenous consolidative airspace disease in LUL and R lung. Transferred to [facility] 8/17. Required ETT 8/20-25. Extubated to HHFNC 55L. PMH includes chronic pain, anxiety/depression, panic disorders, suicide attempt, polysubstance abuse       SLP Plan  MBS      Recommendations for follow up therapy are one component of a multi-disciplinary discharge planning process, led by the attending physician.  Recommendations may be updated based on patient status, additional functional criteria and insurance authorization.    Recommendations  Medication Administration: Whole meds with liquid Supervision: Patient able to self feed Compensations: Slow rate;Small sips/bites                  Oral care BID     Dysphagia, oropharyngeal phase (R13.12)     MBS     Kerrie Pleasure, MA, CCC-SLP Acute Rehabilitation Services Office: [phone] 07/09/2023, 9:46 AM

## 2023-07-09 NOTE — Progress Notes (Addendum)
NAME:  Casey [name], MRN:  [id number], DOB:  1960-12-15, LOS: 13 ADMISSION DATE:  06/25/2023 CONSULTATION DATE:  06/26/2023 REFERRING MD:  Lyn Hollingshead - TRH ([facility]) CHIEF COMPLAINT:  AMS, SOB  History of Present Illness:  62 year old woman who presented to [facility] 8/17 as a transfer from [facility] ICU for AMS, hypoxia requiring BiPAP and possible aspiration PNA. PMHx significant for chronic pain, anxiety/depression, panic disorder, suicide attempt, polysubstance abuse.  Patient initially presented to [facility] ED 8/16 after being found at home unresponsive by family. EMS was called, administered Narcan 2mg  IN with improvement in mental status. Per chart review "possible took two Xanax that weren't Xanax". Patient initially refused transport to hospital but eventually agreed. Drowsy with garbled speech with EMS but able to follow commands. Hypoxic to 86% on RA, initially placed on [place] but escalated to NRB with improvement in SpO2 to 96%. In ED, patient was hypothermic, tachycardic to 100s, BP 139/95, RR 38, SpO2 89% on NRB. Labs were notable for WBC 10.6, Hgb 13.2, Plt 414. Na 136, K 3.5, CO2 22, Cr 0.76, LFTs WNL. UA unremarkable. Ethanol and APAP levels negative. UDS+ cocaine, BZD and THC. CXR demonstrated asymmetric bibasilar interstitial and airspace infiltrates c/f multifocal PNA/aspiration. Unasyn ordered in ED but changed to ceftriaxone due to ?allergy. Patient was subsequently admitted to [facility].  On 8/17, patient was noted to have persistent respiratory distress with escalating O2 requirements to HFNC and eventually BiPAP. CXR appeared worse with rapid progression of diffuse bilateral heterogeneous consolidative airspace disease in LUL and R lung, c/w worsened multifocal PNA and small layering R pleural effusion.   PCCM consulted for transfer to [facility] for further care.  Pertinent Medical History:   Past Medical History:  Diagnosis Date   Anxiety    Chronic back pain    Depression    History of panic attacks  09/11/2014   Medical history non-contributory    Polysubstance abuse (HCC)    History of taking non-prescribed opiates, BZDs; also +cocaine and THC   Ruptured disc, thoracic    Suicide attempt (HCC) 2002   Significant Hospital Events: Including procedures, antibiotic start and stop dates in addition to other pertinent events   8/16 - Presented to [facility] via EMS for AMS, hypoxia. Presumed drug intoxication. UDS +cocaine, BZDs, THC. CXR with multifocal PNA. Required [place] then NRB then HFNC. Empiric ceftriaxone. 8/17 - Worsening respiratory status requiring escalation to BiPAP. ABG poor. CXR worse. Transferred to [facility] for further care. 8/18: Opt-flow 8/20: placed on BiPAP overnight. Continued labored breathing in AM, subsequently intubated. Switched abx to ceftriaxone and vancomycin. Dc'd steroids. 8/21: continued agitation on precedex/fentanyl. Started on propofol gtt. Started on seroquel 8/22: DC antibiotics. off precedex. Weaning prop/fent 8/24 increased Seroquel.  Reattempting Precedex trial.  Was apneic on propofol and fentanyl infusion 8/25 on fent overnight in addition to precedex. Today much better after seroquel adjustment and diuresis. Favorable SBI so extubated to heated high flow.  8/26 remains on HFNC. On precedex overnight but off in AM. Lasix x1. In afternoon, fell from bed - minor abrasions but no significant injury. BiPAP overnight 8/27 DC PICC and foley. Continues on HFNC 8/28 improving overall. Try to wean HFNC and treat coughing/vomiting. 8/29 started unasyn for persistent sputum production. Remains on HFNC. Starting to wean klonopin 8/30 decreased seroquel, oxy, and klonopin.   Interim History / Subjective:  Used Bipap overnight. Feeling much better this morning though still reports vomiting with cough.   Objective:  Blood pressure (!) 140/74, pulse  95, temperature 97.8 F (36.6 C), temperature source Oral, resp. rate (!) 32, height 5\' 5"  (1.651 m), weight 60.8 kg, SpO2 90%.     FiO2 (%):  [50 %-60 %] 50 % PEEP:  [6 cmH20] 6 cmH20   Intake/Output Summary (Last 24 hours) at 07/09/2023 0843 Last data filed at 07/09/2023 0600 Gross per 24 hour  Intake 1019.88 ml  Output --  Net 1019.88 ml   Filed Weights   07/05/23 0443 07/08/23 0500 07/09/23 0500  Weight: 62 kg 61 kg 60.8 kg   Physical Examination:  Gen: NAD sitting in chair, chronically ill, appears deconditioned but improved from prior exam HEENT: Upper lip with healing abrasions, no bleeding. HFNC in place CV: RRR no murmurs appreciated Pulm: Faint crackles bilaterally, R>L. Comfortable WOB on HFNC. Speaking in complete sentences. Abd: Soft, NT/ND Neuro: Awake, alert, appropriately responsive.   Ext: No significant BLE edema     Latest Ref Rng & Units 07/09/2023    8:04 AM 07/08/2023    3:02 AM 07/07/2023    9:10 AM  CBC  WBC 4.0 - 10.5 K/uL 16.5  17.7  22.4   Hemoglobin 12.0 - 15.0 g/dL 44.0  10.2  72.5   Hematocrit 36.0 - 46.0 % 33.9  33.2  35.7   Platelets 150 - 400 K/uL 665  585  585       Latest Ref Rng & Units 07/09/2023    8:04 AM 07/08/2023    3:02 AM 07/07/2023    9:10 AM  BMP  Glucose 70 - 99 mg/dL 366  440  347   BUN 8 - 23 mg/dL 14  17  17    Creatinine 0.44 - 1.00 mg/dL 4.25  9.56  3.87   Sodium 135 - 145 mmol/L 136  135  135   Potassium 3.5 - 5.1 mmol/L 4.3  3.4  3.6   Chloride 98 - 111 mmol/L 98  98  98   CO2 22 - 32 mmol/L 28  26  25    Calcium 8.9 - 10.3 mg/dL 9.0  8.9  9.2      Resolved Hospital Problem List:   Assessment & Plan:  Acute hypoxemic respiratory failure in the setting of multifocal PNA (presumed aspiration) Leukocytosis with neutrophilia, without fever Deconditioning Ongoing yet improving respiratory failure in the setting of agitation, deconditioning, and volume overload 2/2 aspiration. Extubated 8/25, now stable on HFNC. Suspect she is high risk for reintubation given her significant deconditioning and risk of aspiration with post tussive  vomiting. Leukocytosis improving at 16.5 today, unclear etiology of this given no recent steroids, afebrile, s/p antibiotic course for PNA. PICC and Foley removed 8/27. Unasyn started 8/29 due to persistent cough productive of thick sputum. Plan Continue Unasyn for total course of 5d, consider switch to PO augmentin if/when appropriate Continue HFNC, wean as tolerated Manage cough and vomiting to prevent aspiration - phenergan and zofran, phenol spray 8/24 cultures with rare normal flora Monitor CBC and fever curve Appreciate PT/OT/SLP Chest PT If continued improvement today, may be ready for transfer to floor tomorrow   Acute metabolic encephalopathy secondary to sepsis, and prolonged critical illness complicated further by GAD w/ h/o panic disorder, depression, Chronic pain c/b Polysubstance abuse -UDS +cocaine, BZDs, THC. History of taking non-prescribed opiates/BZDs per family (daily xanax off street) Continued though improving impulsive behavior, would still benefit from ICU care for close monitoring Plan Decrease Klonopin to 1mg  BID Decrease Seroquel to 50mg  BID Decrease oxy to 5mg  q6 Continuing Celexa  Off precedex gtt Off fent gtt DC'd robaxin and PRN versed Maximize nonopioid adjuncts Cessation education when appropriate  Urinary retention -Continue bethanechol  -Continue to monitor, in/out as needed  Vomiting - post tussive, benign abd exam -KUB unremarkable 8/27 -phenergan cough syrup prn -PRN zofran -diet as tolerated  Fall 8/26 with dental injury Slightly loose incisor and chipped tooth on exam, otherwise no significant injuries aside from abrasions -outpatient dental follow up  Hyperglycemia Stable, off steroids  Anemia 2/2 acute illness Stable, continue to monitor Transfuse for hgb <7  Best Practice: (right click and "Reselect all SmartList Selections" daily)   Diet/type: Regular consistency (see orders)  DVT prophylaxis: prophylactic heparin  GI  prophylaxis: N/A Lines: PIV x2, PICC removed 8/27 Foley:  N/A removed 8/27 Code Status:  full code Last date of multidisciplinary goals of care discussion: with  patient.     Vonna Drafts, MD 07/09/2023, 8:43 AM

## 2023-07-09 NOTE — Plan of Care (Signed)
  Problem: Education: Goal: Knowledge of General Education information will improve Description: Including pain rating scale, medication(s)/side effects and non-pharmacologic comfort measures Outcome: Progressing   Problem: Health Behavior/Discharge Planning: Goal: Ability to manage health-related needs will improve Outcome: Progressing   Problem: Clinical Measurements: Goal: Ability to maintain clinical measurements within normal limits will improve Outcome: Progressing Goal: Will remain free from infection Outcome: Progressing Goal: Diagnostic test results will improve Outcome: Progressing Goal: Respiratory complications will improve Outcome: Progressing Goal: Cardiovascular complication will be avoided Outcome: Progressing   Problem: Activity: Goal: Risk for activity intolerance will decrease Outcome: Progressing   Problem: Nutrition: Goal: Adequate nutrition will be maintained Outcome: Progressing   Problem: Coping: Goal: Level of anxiety will decrease Outcome: Progressing   Problem: Elimination: Goal: Will not experience complications related to bowel motility Outcome: Progressing Goal: Will not experience complications related to urinary retention Outcome: Progressing   Problem: Pain Managment: Goal: General experience of comfort will improve Outcome: Progressing   Problem: Safety: Goal: Ability to remain free from injury will improve Outcome: Progressing   Problem: Skin Integrity: Goal: Risk for impaired skin integrity will decrease Outcome: Progressing   Problem: Safety: Goal: Non-violent Restraint(s) Outcome: Progressing   Problem: Activity: Goal: Ability to tolerate increased activity will improve Outcome: Progressing   Problem: Clinical Measurements: Goal: Ability to maintain a body temperature in the normal range will improve Outcome: Progressing   Problem: Respiratory: Goal: Ability to maintain adequate ventilation will improve Outcome:  Progressing Goal: Ability to maintain a clear airway will improve Outcome: Progressing

## 2023-07-10 LAB — CBC
HCT: 33.1 % — ABNORMAL LOW (ref 36.0–46.0)
Hemoglobin: 10.6 g/dL — ABNORMAL LOW (ref 12.0–15.0)
MCH: 27.2 pg (ref 26.0–34.0)
MCHC: 32 g/dL (ref 30.0–36.0)
MCV: 85.1 fL (ref 80.0–100.0)
Platelets: 705 10*3/uL — ABNORMAL HIGH (ref 150–400)
RBC: 3.89 MIL/uL (ref 3.87–5.11)
RDW: 14.7 % (ref 11.5–15.5)
WBC: 14.2 10*3/uL — ABNORMAL HIGH (ref 4.0–10.5)
nRBC: 0 % (ref 0.0–0.2)

## 2023-07-10 LAB — BASIC METABOLIC PANEL
Anion gap: 11 (ref 5–15)
BUN: 13 mg/dL (ref 8–23)
CO2: 27 mmol/L (ref 22–32)
Calcium: 8.8 mg/dL — ABNORMAL LOW (ref 8.9–10.3)
Chloride: 98 mmol/L (ref 98–111)
Creatinine, Ser: 0.6 mg/dL (ref 0.44–1.00)
GFR, Estimated: >60 mL/min (ref >60)
Glucose, Bld: 119 mg/dL — ABNORMAL HIGH (ref 70–99)
Potassium: 3.7 mmol/L (ref 3.5–5.1)
Sodium: 136 mmol/L (ref 135–145)

## 2023-07-10 MED ORDER — QUETIAPINE FUMARATE 25 MG PO TABS
25.0000 mg | ORAL_TABLET | Freq: Two times a day (BID) | ORAL | Status: DC
  Administered 2023-07-10 – 2023-07-11 (×2): 25 mg via ORAL
  Filled 2023-07-10 (×2): qty 1

## 2023-07-10 MED ORDER — CLONAZEPAM 0.5 MG PO TABS
0.5000 mg | ORAL_TABLET | Freq: Two times a day (BID) | ORAL | Status: DC
  Administered 2023-07-10 – 2023-07-11 (×2): 0.5 mg via ORAL
  Filled 2023-07-10 (×2): qty 1

## 2023-07-10 NOTE — Plan of Care (Signed)
  Problem: Education: Goal: Knowledge of General Education information will improve Description Including pain rating scale, medication(s)/side effects and non-pharmacologic comfort measures Outcome: Progressing   

## 2023-07-10 NOTE — Progress Notes (Signed)
PROGRESS NOTE        PATIENT DETAILS Name: Casey [name] Age: 62 y.o. Sex: female Date of Birth: [birth year] Admit Date: 06/25/2023 Admitting Physician Asia B Zierle-Ghosh, DO JOA:[id number], No Pcp Per  Brief Summary: Patient is a 62 y.o.  female with history of anxiety/depression, chronic pain syndrome, polysubstance abuse (cocaine/methamphetamine/marijuana) who presented to [facility] on 8/16 with shortness of breath-she was found to have acute hypoxic respiratory failure in the setting of multifocal pneumonia, she was subsequently admitted to the hospitalist service-she developed further hypoxemia/encephalopathy and was intubated and subsequently transferred to [facility] ICU.  She was provided supportive care-extubated-and subsequently transferred to [facility] on 8/31.  See below for further details   Significant events: 8/16>> Presented to [facility] with AMS/hypoxia-multifocal pneumonia on CXR-initially on [place] then NRB then HFNC.   8/17>> worsening dyspnea-started on BiPAP.  Transferred to [facility]   8/20 >> intubated  8/25 >> extubated to heated high flow 8/31>> transferred to [facility]  Significant studies: 8/31>> CXR: Multifocal PNA  Significant microbiology data: 8/19>> respiratory virus panel: Negative 8/19>> blood culture: No growth 8/22>> tracheal aspirate culture: Rare Candida (likely colonization) 8/24>> tracheal aspirate: No growth  Procedures: 8/17-8/25>> ETT  Consults: PCCM  Subjective: Lying comfortably in bed-denies any chest pain or shortness of breath.  Wants to remove her NG tube-claims she is eating.  Titrated down to 3-5 L of HFNC  Objective: Vitals: Blood pressure 138/68, pulse 100, temperature 97.6 F (36.4 C), temperature source Oral, resp. rate (!) 24, height 5\' 5"  (1.651 m), weight 59.8 kg, SpO2 91%.   Exam: Gen Exam:Alert awake-not in any distress HEENT:atraumatic, normocephalic Chest: B/L clear to auscultation anteriorly CVS:S1S2 regular Abdomen:soft  non tender, non distended Extremities:no edema Neurology: Non focal Skin: no rash  Pertinent Labs/Radiology:    Latest Ref Rng & Units 07/10/2023    3:46 AM 07/09/2023    8:04 AM 07/08/2023    3:02 AM  CBC  WBC 4.0 - 10.5 K/uL 14.2  16.5  17.7   Hemoglobin 12.0 - 15.0 g/dL 01.6  01.0  93.2   Hematocrit 36.0 - 46.0 % 33.1  33.9  33.2   Platelets 150 - 400 K/uL 705  665  585     Lab Results  Component Value Date   NA 136 07/10/2023   K 3.7 07/10/2023   CL 98 07/10/2023   CO2 27 07/10/2023      Assessment/Plan: Severe sepsis acute hypoxic respiratory failure due to multifocal PNA-likely secondary to aspiration Improved-decreasing FiO2-down to anywhere from 3-5 L of HFNC today Continue Unasyn Mobilize/pulmonary toileting  Acute metabolic encephalopathy Secondary to pneumonia/hypoxemia-in the setting of chronic opiate/drug use Improved Klonopin/Seroquel being slowly titrated down-decrease dosage further today.  Normocytic anemia Due to critical illness No evidence of blood loss Transfuse if significant drop  Acute urinary retention Foley catheter removed in the ICU Stopping bethanechol Mobilize  Oropharyngeal dysphagia In the setting of critical illness/debility/deconditioning Oral intake apparently rapidly improving Suspect NG tube can be discontinued either later today or tomorrow depending on oral intake.  Fall 8/26 with dental injury Reviewed ICU note-plans are for outpatient dentistry follow-up  History of anxiety/panic disorder Klonopin being titrated down Apparently taking Xanax that she buys off the streets Continue Celexa  Polysubstance abuse (cocaine/methamphetamine/marijuana) Counseled  Nutrition Status: Nutrition Problem: Moderate Malnutrition Etiology: social / environmental circumstances (polysubstance abuse) Signs/Symptoms: mild  fat depletion, mild muscle depletion Interventions: Tube feeding, MVI, Other (Comment) (Banatrol  TF)  BMI: Estimated body mass index is 21.94 kg/m as calculated from the following:   Height as of this encounter: 5\' 5"  (1.651 m).   Weight as of this encounter: 59.8 kg.   Code status:   Code Status: Full Code   DVT Prophylaxis: heparin injection 5,000 Units Start: 06/26/23 2200 SCDs Start: 06/26/23 2112   Family Communication: None at bedside   Disposition Plan: Status is: Inpatient Remains inpatient appropriate because: Severity of illness   Planned Discharge Destination:Rehabilitation facility   Diet: Diet Order             DIET DYS 3 Fluid consistency: Nectar Thick  Diet effective now                     Antimicrobial agents: Anti-infectives (From admission, onward)    Start     Dose/Rate Route Frequency Ordered Stop   07/08/23 1030  Ampicillin-Sulbactam (UNASYN) 3 g in sodium chloride 0.9 % 100 mL IVPB        3 g 200 mL/hr over 30 Minutes Intravenous Every 6 hours 07/08/23 0930 07/13/23 1029   06/29/23 1400  cefTRIAXone (ROCEPHIN) 2 g in sodium chloride 0.9 % 100 mL IVPB  Status:  Discontinued        2 g 200 mL/hr over 30 Minutes Intravenous Every 24 hours 06/29/23 1014 07/01/23 0934   06/28/23 1000  azithromycin (ZITHROMAX) 500 mg in sodium chloride 0.9 % 250 mL IVPB  Status:  Discontinued        500 mg 250 mL/hr over 60 Minutes Intravenous Every 24 hours 06/28/23 0820 06/29/23 1014   06/27/23 2200  vancomycin (VANCOREADY) IVPB 1500 mg/300 mL  Status:  Discontinued        1,500 mg 150 mL/hr over 120 Minutes Intravenous Every 24 hours 06/26/23 2129 06/30/23 1317   06/26/23 2215  ceFEPIme (MAXIPIME) 2 g in sodium chloride 0.9 % 100 mL IVPB  Status:  Discontinued        2 g 200 mL/hr over 30 Minutes Intravenous Every 8 hours 06/26/23 2118 06/29/23 1014   06/26/23 2200  vancomycin (VANCOREADY) IVPB 1500 mg/300 mL        1,500 mg 150 mL/hr over 120 Minutes Intravenous  Once 06/26/23 2118 06/27/23 0139   06/26/23 0200  cefTRIAXone (ROCEPHIN) 2 g in  sodium chloride 0.9 % 100 mL IVPB  Status:  Discontinued        2 g 200 mL/hr over 30 Minutes Intravenous Every 24 hours 06/25/23 2343 06/26/23 2046   06/25/23 2145  Ampicillin-Sulbactam (UNASYN) 3 g in sodium chloride 0.9 % 100 mL IVPB        3 g 200 mL/hr over 30 Minutes Intravenous  Once 06/25/23 2138 06/25/23 2300        MEDICATIONS: Scheduled Meds:  arformoterol  15 mcg Nebulization BID   bethanechol  5 mg Oral TID   budesonide (PULMICORT) nebulizer solution  0.25 mg Nebulization BID   Chlorhexidine Gluconate Cloth  6 each Topical Daily   citalopram  20 mg Per Tube Daily   clonazePAM  1 mg Oral BID   feeding supplement (PROSource TF20)  60 mL Per Tube Daily   fiber supplement (BANATROL TF)  60 mL Per Tube BID   guaiFENesin  15 mL Oral Q6H   heparin  5,000 Units Subcutaneous Q8H   multivitamin with minerals  1 tablet Oral Daily  mupirocin ointment   Nasal BID   mouth rinse  15 mL Mouth Rinse 4 times per day   QUEtiapine  50 mg Oral BID   revefenacin  175 mcg Nebulization Daily   sodium chloride flush  10-40 mL Intracatheter Q12H   Continuous Infusions:  sodium chloride Stopped (07/01/23 2119)   ampicillin-sulbactam (UNASYN) IV 3 g (07/10/23 0455)   feeding supplement (OSMOLITE 1.5 CAL) 20 mL/hr at 07/09/23 1400   PRN Meds:.acetaminophen **OR** acetaminophen, albuterol, haloperidol lactate, hydrOXYzine, ipratropium-albuterol, labetalol, menthol-cetylpyridinium, ondansetron **OR** ondansetron (ZOFRAN) IV, mouth rinse, oxyCODONE, phenol, promethazine, sodium chloride flush   I have personally reviewed following labs and imaging studies  LABORATORY DATA: CBC: Recent Labs  Lab 07/06/23 0401 07/07/23 0910 07/08/23 0302 07/09/23 0804 07/10/23 0346  WBC 23.8* 22.4* 17.7* 16.5* 14.2*  NEUTROABS 19.1* 18.0*  --   --   --   HGB 11.1* 11.5* 10.6* 11.0* 10.6*  HCT 33.2* 35.7* 33.2* 33.9* 33.1*  MCV 85.6 83.8 84.9 84.5 85.1  PLT 417* 585* 585* 665* 705*    Basic  Metabolic Panel: Recent Labs  Lab 07/05/23 0558 07/06/23 0401 07/07/23 0910 07/08/23 0302 07/09/23 0804 07/10/23 0346  NA 135 136 135 135 136 136  K 4.0 3.5 3.6 3.4* 4.3 3.7  CL 101 98 98 98 98 98  CO2 26 27 25 26 28 27   GLUCOSE 118* 105* 141* 128* 127* 119*  BUN 14 14 17 17 14 13   CREATININE 0.71 0.57 0.60 0.40* 0.46 0.60  CALCIUM 8.4* 8.6* 9.2 8.9 9.0 8.8*  MG 2.1 2.4 2.4  --   --   --   PHOS  --  3.8 4.4  --   --   --     GFR: Estimated Creatinine Clearance: 63.9 mL/min (by C-G formula based on SCr of 0.6 mg/dL).  Liver Function Tests: Recent Labs  Lab 07/04/23 0616  AST 12*  ALT 16  ALKPHOS 67  BILITOT 0.2*  PROT 5.7*  ALBUMIN 2.0*   No results for input(s): "LIPASE", "AMYLASE" in the last 168 hours. No results for input(s): "AMMONIA" in the last 168 hours.  Coagulation Profile: No results for input(s): "INR", "PROTIME" in the last 168 hours.  Cardiac Enzymes: No results for input(s): "CKTOTAL", "CKMB", "CKMBINDEX", "TROPONINI" in the last 168 hours.  BNP (last 3 results) No results for input(s): "PROBNP" in the last 8760 hours.  Lipid Profile: No results for input(s): "CHOL", "HDL", "LDLCALC", "TRIG", "CHOLHDL", "LDLDIRECT" in the last 72 hours.  Thyroid Function Tests: No results for input(s): "TSH", "T4TOTAL", "FREET4", "T3FREE", "THYROIDAB" in the last 72 hours.  Anemia Panel: No results for input(s): "VITAMINB12", "FOLATE", "FERRITIN", "TIBC", "IRON", "RETICCTPCT" in the last 72 hours.  Urine analysis:    Component Value Date/Time   COLORURINE YELLOW 06/26/2023 0134   APPEARANCEUR CLEAR 06/26/2023 0134   LABSPEC 1.014 06/26/2023 0134   PHURINE 5.0 06/26/2023 0134   GLUCOSEU NEGATIVE 06/26/2023 0134   HGBUR SMALL (A) 06/26/2023 0134   BILIRUBINUR NEGATIVE 06/26/2023 0134   KETONESUR NEGATIVE 06/26/2023 0134   PROTEINUR NEGATIVE 06/26/2023 0134   UROBILINOGEN 0.2 05/02/2009 1015   NITRITE NEGATIVE 06/26/2023 0134   LEUKOCYTESUR NEGATIVE  06/26/2023 0134    Sepsis Labs: Lactic Acid, Venous    Component Value Date/Time   LATICACIDVEN 1.3 06/29/2023 1432    MICROBIOLOGY: Recent Results (from the past 240 hour(s))  Culture, Respiratory w Gram Stain     Status: None   Collection Time: 07/01/23  2:33 PM  Specimen: Tracheal Aspirate; Respiratory  Result Value Ref Range Status   Specimen Description TRACHEAL ASPIRATE  Final   Special Requests NONE  Final   Gram Stain   Final    FEW WBC PRESENT, PREDOMINANTLY PMN RARE BUDDING YEAST SEEN Performed at [facility], [address] [place], [place] [address]    Culture RARE CANDIDA DUBLINIENSIS  Final   Report Status 07/05/2023 FINAL  Final  Culture, Respiratory w Gram Stain     Status: None   Collection Time: 07/03/23  4:56 PM   Specimen: Tracheal Aspirate; Respiratory  Result Value Ref Range Status   Specimen Description TRACHEAL ASPIRATE  Final   Special Requests NONE  Final   Gram Stain   Final    FEW WBC PRESENT, PREDOMINANTLY PMN NO ORGANISMS SEEN    Culture   Final    RARE Normal respiratory flora-no Staph aureus or Pseudomonas seen Performed at [facility], [address], [place], [place] [address]    Report Status 07/06/2023 FINAL  Final    RADIOLOGY STUDIES/RESULTS: DG CHEST PORT 1 VIEW  Result Date: 07/08/2023 CLINICAL DATA:  Respiratory failure.  Pneumonia. EXAM: PORTABLE CHEST 1 VIEW COMPARISON:  July 04, 2023. FINDINGS: The heart size and mediastinal contours are within normal limits. Status post left shoulder arthroplasty. Endotracheal tube has been removed. Feeding tube is seen entering stomach. Stable bilateral lung opacities are noted, right greater than left, consistent with multifocal pneumonia. IMPRESSION: Stable bilateral lung opacities consistent with multifocal pneumonia. Electronically Signed   By: Lupita Raider M.D.   On: 07/08/2023 14:48     LOS: 14 days   Jeoffrey Massed, MD  [facility]    To contact the  attending provider between 7A-7P or the covering provider during after hours 7P-7A, please log into the web site [email or web address] and access using universal [place] password for that web site. If you do not have the password, please call the hospital operator.  07/10/2023, 9:59 AM

## 2023-07-11 LAB — CBC
HCT: 33.7 % — ABNORMAL LOW (ref 36.0–46.0)
Hemoglobin: 10.9 g/dL — ABNORMAL LOW (ref 12.0–15.0)
MCH: 28.2 pg (ref 26.0–34.0)
MCHC: 32.3 g/dL (ref 30.0–36.0)
MCV: 87.3 fL (ref 80.0–100.0)
Platelets: 773 10*3/uL — ABNORMAL HIGH (ref 150–400)
RBC: 3.86 MIL/uL — ABNORMAL LOW (ref 3.87–5.11)
RDW: 15 % (ref 11.5–15.5)
WBC: 14.7 10*3/uL — ABNORMAL HIGH (ref 4.0–10.5)
nRBC: 0 % (ref 0.0–0.2)

## 2023-07-11 LAB — BASIC METABOLIC PANEL
Anion gap: 8 (ref 5–15)
BUN: 12 mg/dL (ref 8–23)
CO2: 25 mmol/L (ref 22–32)
Calcium: 8.7 mg/dL — ABNORMAL LOW (ref 8.9–10.3)
Chloride: 102 mmol/L (ref 98–111)
Creatinine, Ser: 0.7 mg/dL (ref 0.44–1.00)
GFR, Estimated: >60 mL/min (ref >60)
Glucose, Bld: 131 mg/dL — ABNORMAL HIGH (ref 70–99)
Potassium: 3.9 mmol/L (ref 3.5–5.1)
Sodium: 135 mmol/L (ref 135–145)

## 2023-07-11 MED ORDER — MELATONIN 5 MG PO TABS
10.0000 mg | ORAL_TABLET | Freq: Every day | ORAL | Status: DC
  Administered 2023-07-11: 10 mg via ORAL
  Filled 2023-07-11: qty 2

## 2023-07-11 MED ORDER — CLONAZEPAM 0.5 MG PO TABS
0.5000 mg | ORAL_TABLET | Freq: Two times a day (BID) | ORAL | Status: DC | PRN
  Administered 2023-07-12: 0.5 mg via ORAL
  Filled 2023-07-11: qty 1

## 2023-07-11 MED ORDER — ENSURE ENLIVE PO LIQD
237.0000 mL | Freq: Three times a day (TID) | ORAL | Status: DC
  Administered 2023-07-11 – 2023-07-12 (×3): 237 mL via ORAL

## 2023-07-11 MED ORDER — QUETIAPINE FUMARATE 25 MG PO TABS: 25.0000 mg | ORAL_TABLET | Freq: Every day | ORAL | Status: DC

## 2023-07-11 NOTE — Progress Notes (Signed)
PROGRESS NOTE        PATIENT DETAILS Name: Casey [name] Age: 62 y.o. Sex: female Date of Birth: 28-Apr-1961 Admit Date: 06/25/2023 Admitting Physician Asia B Zierle-Ghosh, DO ZOX:[id number], No Pcp Per  Brief Summary: Patient is a 62 y.o.  female with history of anxiety/depression, chronic pain syndrome, polysubstance abuse (cocaine/methamphetamine/marijuana) who presented to [facility] on 8/16 with shortness of breath-she was found to have acute hypoxic respiratory failure in the setting of multifocal pneumonia, she was subsequently admitted to the hospitalist service-she developed further hypoxemia/encephalopathy and was intubated and subsequently transferred to [facility] ICU.  She was provided supportive care-extubated-and subsequently transferred to [facility] on 8/31.  See below for further details   Significant events: 8/16>> Presented to [facility] with AMS/hypoxia-multifocal pneumonia on CXR-initially on [place] then NRB then HFNC.   8/17>> worsening dyspnea-started on BiPAP.  Transferred to [facility].   8/20 >> intubated  8/25 >> extubated to heated high flow 8/31>> transferred to [facility]  Significant studies: 8/31>> CXR: Multifocal PNA  Significant microbiology data: 8/19>> respiratory virus panel: Negative 8/19>> blood culture: No growth 8/22>> tracheal aspirate culture: Rare Candida (likely colonization) 8/24>> tracheal aspirate: No growth  Procedures: 8/17-8/25>> ETT  Consults: PCCM  Subjective: Much better-no complaints-titrated to room air this morning.  Wants NG tube removed-apparently ate more than 50% of her meals yesterday.  Objective: Vitals: Blood pressure 139/76, pulse 89, temperature 98.1 F (36.7 C), temperature source Oral, resp. rate 16, height 5\' 5"  (1.651 m), weight 60.2 kg, SpO2 96%.   Exam: Gen Exam:Alert awake-not in any distress HEENT:atraumatic, normocephalic Chest: B/L clear to auscultation anteriorly CVS:S1S2 regular Abdomen:soft non tender, non  distended Extremities:no edema Neurology: Non focal Skin: no rash  Pertinent Labs/Radiology:    Latest Ref Rng & Units 07/11/2023    6:32 AM 07/10/2023    3:46 AM 07/09/2023    8:04 AM  CBC  WBC 4.0 - 10.5 K/uL 14.7  14.2  16.5   Hemoglobin 12.0 - 15.0 g/dL 09.8  11.9  14.7   Hematocrit 36.0 - 46.0 % 33.7  33.1  33.9   Platelets 150 - 400 K/uL 773  705  665     Lab Results  Component Value Date   NA 135 07/11/2023   K 3.9 07/11/2023   CL 102 07/11/2023   CO2 25 07/11/2023      Assessment/Plan: Severe sepsis acute hypoxic respiratory failure due to multifocal PNA-likely secondary to aspiration Sepsis physiology resolved Significant improvement in hypoxemia-titrated to room air today Continue Unasyn  Continue pulmonary tolerating/Mobilization  Acute metabolic encephalopathy Secondary to pneumonia/hypoxemia-in the setting of chronic opiate/drug use Encephalopathy has resolved-she is completely awake and alert. Change Seroquel to nightly dosing-change Klonopin to as needed dosing.    Normocytic anemia Due to critical illness No evidence of blood loss Transfuse if significant drop  Acute urinary retention Foley catheter removed in the ICU Bethanechol discontinued 8/31-voiding without any issues.   Oropharyngeal dysphagia In the setting of critical illness/debility/deconditioning Significant improvement in encephalopathy/deconditioning over the past several days Apparently ate more than 50% of her meals yesterday-discontinue NG tube today and see how she does.  Fall 8/26 with dental injury Reviewed ICU note-plans are for outpatient dentistry follow-up  History of anxiety/panic disorder Apparently taking Xanax that she buys off the streets Change Klonopin to as needed dosing Continue Celexa  Polysubstance abuse (cocaine/methamphetamine/marijuana)  Counseled  Debility/deconditioning Secondary to acute illness Rapidly improving CIR was recommended but rapidly  improving-patient keen on going home.  PT to reevaluate either today or tomorrow.  Nutrition Status: Nutrition Problem: Moderate Malnutrition Etiology: social / environmental circumstances (polysubstance abuse) Signs/Symptoms: mild fat depletion, mild muscle depletion Interventions: Tube feeding, MVI, Other (Comment) (Banatrol TF)  BMI: Estimated body mass index is 22.09 kg/m as calculated from the following:   Height as of this encounter: 5\' 5"  (1.651 m).   Weight as of this encounter: 60.2 kg.   Code status:   Code Status: Full Code   DVT Prophylaxis: heparin injection 5,000 Units Start: 06/26/23 2200 SCDs Start: 06/26/23 2112   Family Communication: None at bedside   Disposition Plan: Status is: Inpatient Remains inpatient appropriate because: Severity of illness   Planned Discharge Destination:Rehabilitation facility   Diet: Diet Order             DIET DYS 3 Fluid consistency: Nectar Thick  Diet effective now                     Antimicrobial agents: Anti-infectives (From admission, onward)    Start     Dose/Rate Route Frequency Ordered Stop   07/08/23 1030  Ampicillin-Sulbactam (UNASYN) 3 g in sodium chloride 0.9 % 100 mL IVPB        3 g 200 mL/hr over 30 Minutes Intravenous Every 6 hours 07/08/23 0930 07/13/23 1029   06/29/23 1400  cefTRIAXone (ROCEPHIN) 2 g in sodium chloride 0.9 % 100 mL IVPB  Status:  Discontinued        2 g 200 mL/hr over 30 Minutes Intravenous Every 24 hours 06/29/23 1014 07/01/23 0934   06/28/23 1000  azithromycin (ZITHROMAX) 500 mg in sodium chloride 0.9 % 250 mL IVPB  Status:  Discontinued        500 mg 250 mL/hr over 60 Minutes Intravenous Every 24 hours 06/28/23 0820 06/29/23 1014   06/27/23 2200  vancomycin (VANCOREADY) IVPB 1500 mg/300 mL  Status:  Discontinued        1,500 mg 150 mL/hr over 120 Minutes Intravenous Every 24 hours 06/26/23 2129 06/30/23 1317   06/26/23 2215  ceFEPIme (MAXIPIME) 2 g in sodium chloride 0.9 %  100 mL IVPB  Status:  Discontinued        2 g 200 mL/hr over 30 Minutes Intravenous Every 8 hours 06/26/23 2118 06/29/23 1014   06/26/23 2200  vancomycin (VANCOREADY) IVPB 1500 mg/300 mL        1,500 mg 150 mL/hr over 120 Minutes Intravenous  Once 06/26/23 2118 06/27/23 0139   06/26/23 0200  cefTRIAXone (ROCEPHIN) 2 g in sodium chloride 0.9 % 100 mL IVPB  Status:  Discontinued        2 g 200 mL/hr over 30 Minutes Intravenous Every 24 hours 06/25/23 2343 06/26/23 2046   06/25/23 2145  Ampicillin-Sulbactam (UNASYN) 3 g in sodium chloride 0.9 % 100 mL IVPB        3 g 200 mL/hr over 30 Minutes Intravenous  Once 06/25/23 2138 06/25/23 2300        MEDICATIONS: Scheduled Meds:  arformoterol  15 mcg Nebulization BID   budesonide (PULMICORT) nebulizer solution  0.25 mg Nebulization BID   Chlorhexidine Gluconate Cloth  6 each Topical Daily   citalopram  20 mg Per Tube Daily   clonazePAM  0.5 mg Oral BID   feeding supplement (PROSource TF20)  60 mL Per Tube Daily   fiber supplement (  BANATROL TF)  60 mL Per Tube BID   guaiFENesin  15 mL Oral Q6H   heparin  5,000 Units Subcutaneous Q8H   multivitamin with minerals  1 tablet Oral Daily   mupirocin ointment   Nasal BID   mouth rinse  15 mL Mouth Rinse 4 times per day   QUEtiapine  25 mg Oral BID   revefenacin  175 mcg Nebulization Daily   sodium chloride flush  10-40 mL Intracatheter Q12H   Continuous Infusions:  sodium chloride Stopped (07/01/23 2119)   ampicillin-sulbactam (UNASYN) IV 3 g (07/11/23 0421)   feeding supplement (OSMOLITE 1.5 CAL) 1,000 mL (07/10/23 2036)   PRN Meds:.acetaminophen **OR** acetaminophen, albuterol, haloperidol lactate, hydrOXYzine, ipratropium-albuterol, labetalol, menthol-cetylpyridinium, ondansetron **OR** ondansetron (ZOFRAN) IV, mouth rinse, oxyCODONE, phenol, promethazine, sodium chloride flush   I have personally reviewed following labs and imaging studies  LABORATORY DATA: CBC: Recent Labs  Lab  07/06/23 0401 07/07/23 0910 07/08/23 0302 07/09/23 0804 07/10/23 0346 07/11/23 0632  WBC 23.8* 22.4* 17.7* 16.5* 14.2* 14.7*  NEUTROABS 19.1* 18.0*  --   --   --   --   HGB 11.1* 11.5* 10.6* 11.0* 10.6* 10.9*  HCT 33.2* 35.7* 33.2* 33.9* 33.1* 33.7*  MCV 85.6 83.8 84.9 84.5 85.1 87.3  PLT 417* 585* 585* 665* 705* 773*    Basic Metabolic Panel: Recent Labs  Lab 07/05/23 0558 07/06/23 0401 07/07/23 0910 07/08/23 0302 07/09/23 0804 07/10/23 0346 07/11/23 0632  NA 135 136 135 135 136 136 135  K 4.0 3.5 3.6 3.4* 4.3 3.7 3.9  CL 101 98 98 98 98 98 102  CO2 26 27 25 26 28 27 25   GLUCOSE 118* 105* 141* 128* 127* 119* 131*  BUN 14 14 17 17 14 13 12   CREATININE 0.71 0.57 0.60 0.40* 0.46 0.60 0.70  CALCIUM 8.4* 8.6* 9.2 8.9 9.0 8.8* 8.7*  MG 2.1 2.4 2.4  --   --   --   --   PHOS  --  3.8 4.4  --   --   --   --     GFR: Estimated Creatinine Clearance: 63.9 mL/min (by C-G formula based on SCr of 0.7 mg/dL).  Liver Function Tests: No results for input(s): "AST", "ALT", "ALKPHOS", "BILITOT", "PROT", "ALBUMIN" in the last 168 hours.  No results for input(s): "LIPASE", "AMYLASE" in the last 168 hours. No results for input(s): "AMMONIA" in the last 168 hours.  Coagulation Profile: No results for input(s): "INR", "PROTIME" in the last 168 hours.  Cardiac Enzymes: No results for input(s): "CKTOTAL", "CKMB", "CKMBINDEX", "TROPONINI" in the last 168 hours.  BNP (last 3 results) No results for input(s): "PROBNP" in the last 8760 hours.  Lipid Profile: No results for input(s): "CHOL", "HDL", "LDLCALC", "TRIG", "CHOLHDL", "LDLDIRECT" in the last 72 hours.  Thyroid Function Tests: No results for input(s): "TSH", "T4TOTAL", "FREET4", "T3FREE", "THYROIDAB" in the last 72 hours.  Anemia Panel: No results for input(s): "VITAMINB12", "FOLATE", "FERRITIN", "TIBC", "IRON", "RETICCTPCT" in the last 72 hours.  Urine analysis:    Component Value Date/Time   COLORURINE YELLOW 06/26/2023  0134   APPEARANCEUR CLEAR 06/26/2023 0134   LABSPEC 1.014 06/26/2023 0134   PHURINE 5.0 06/26/2023 0134   GLUCOSEU NEGATIVE 06/26/2023 0134   HGBUR SMALL (A) 06/26/2023 0134   BILIRUBINUR NEGATIVE 06/26/2023 0134   KETONESUR NEGATIVE 06/26/2023 0134   PROTEINUR NEGATIVE 06/26/2023 0134   UROBILINOGEN 0.2 05/02/2009 1015   NITRITE NEGATIVE 06/26/2023 0134   LEUKOCYTESUR NEGATIVE 06/26/2023 0134  Sepsis Labs: Lactic Acid, Venous    Component Value Date/Time   LATICACIDVEN 1.3 06/29/2023 1432    MICROBIOLOGY: Recent Results (from the past 240 hour(s))  Culture, Respiratory w Gram Stain     Status: None   Collection Time: 07/01/23  2:33 PM   Specimen: Tracheal Aspirate; Respiratory  Result Value Ref Range Status   Specimen Description TRACHEAL ASPIRATE  Final   Special Requests NONE  Final   Gram Stain   Final    FEW WBC PRESENT, PREDOMINANTLY PMN RARE BUDDING YEAST SEEN Performed at [facility], [address], [place], [place] [address]    Culture RARE CANDIDA DUBLINIENSIS  Final   Report Status 07/05/2023 FINAL  Final  Culture, Respiratory w Gram Stain     Status: None   Collection Time: 07/03/23  4:56 PM   Specimen: Tracheal Aspirate; Respiratory  Result Value Ref Range Status   Specimen Description TRACHEAL ASPIRATE  Final   Special Requests NONE  Final   Gram Stain   Final    FEW WBC PRESENT, PREDOMINANTLY PMN NO ORGANISMS SEEN    Culture   Final    RARE Normal respiratory flora-no Staph aureus or Pseudomonas seen Performed at [facility], [address], [place], [place] [address]    Report Status 07/06/2023 FINAL  Final    RADIOLOGY STUDIES/RESULTS: No results found.   LOS: 15 days   Jeoffrey Massed, MD  [facility]    To contact the attending provider between 7A-7P or the covering provider during after hours 7P-7A, please log into the web site [email or web address] and access using universal [place] password for that web site. If  you do not have the password, please call the hospital operator.  07/11/2023, 10:15 AM

## 2023-07-11 NOTE — Plan of Care (Signed)

## 2023-07-11 NOTE — Plan of Care (Signed)

## 2023-07-12 ENCOUNTER — Inpatient Hospital Stay (HOSPITAL_COMMUNITY): Payer: MEDICAID

## 2023-07-12 ENCOUNTER — Other Ambulatory Visit: Payer: Self-pay | Admitting: Family Medicine

## 2023-07-12 DIAGNOSIS — F5101 Primary insomnia: Secondary | ICD-10-CM

## 2023-07-12 DIAGNOSIS — F411 Generalized anxiety disorder: Secondary | ICD-10-CM

## 2023-07-12 DIAGNOSIS — F339 Major depressive disorder, recurrent, unspecified: Secondary | ICD-10-CM

## 2023-07-12 LAB — CBC
HCT: 33.8 % — ABNORMAL LOW (ref 36.0–46.0)
Hemoglobin: 10.8 g/dL — ABNORMAL LOW (ref 12.0–15.0)
MCH: 27.6 pg (ref 26.0–34.0)
MCHC: 32 g/dL (ref 30.0–36.0)
MCV: 86.2 fL (ref 80.0–100.0)
Platelets: 788 10*3/uL — ABNORMAL HIGH (ref 150–400)
RBC: 3.92 MIL/uL (ref 3.87–5.11)
RDW: 15.3 % (ref 11.5–15.5)
WBC: 14.1 10*3/uL — ABNORMAL HIGH (ref 4.0–10.5)
nRBC: 0 % (ref 0.0–0.2)

## 2023-07-12 MED ORDER — AMOXICILLIN-POT CLAVULANATE 875-125 MG PO TABS: 1.0000 | ORAL_TABLET | Freq: Two times a day (BID) | ORAL | 0 refills | Status: AC

## 2023-07-12 MED ORDER — BENZONATATE 100 MG PO CAPS: 100.0000 mg | ORAL_CAPSULE | Freq: Three times a day (TID) | ORAL | 0 refills | Status: DC | PRN

## 2023-07-12 MED ORDER — HYDROXYZINE HCL 50 MG PO TABS: 50.0000 mg | ORAL_TABLET | Freq: Three times a day (TID) | ORAL | 0 refills | Status: DC | PRN

## 2023-07-12 MED ORDER — ALBUTEROL SULFATE HFA 108 (90 BASE) MCG/ACT IN AERS: 2.0000 | INHALATION_SPRAY | Freq: Four times a day (QID) | RESPIRATORY_TRACT | 0 refills | Status: DC | PRN

## 2023-07-12 MED ORDER — MELATONIN 10 MG PO TABS: 10.0000 mg | ORAL_TABLET | Freq: Every day | ORAL | 1 refills | Status: DC

## 2023-07-12 NOTE — Progress Notes (Signed)
Anxious to leave the hospital today-physical therapy to see patient Ambulated independently> 220 feet-gait steady-did not require any assistance-did not even hold on to the walls. Plan is to discharge home today-Home health has been ordered.

## 2023-07-12 NOTE — Progress Notes (Signed)
Occupational Therapy Treatment Patient Details Name: Casey [name] MRN: [id number] DOB: [birth year] Today's Date: 07/12/2023   History of present illness 62 yo female transferred to [facility] 8/17 from [facility] with AMS (presumed drug intoxication, UDS +cocaine/BZD/THC) and multifocal PNA, possible aspiration. Intubated  8/20-8/25. Tenuous with persistent tachypnea, cough with shallow breathing on HHFNC/Bipap post extubation. PMH: chronic pain, anxiety/depression, panic disorder, suicide attempt, polysubstance abuse.   OT comments  Pt progressing very well, no longer on 02 and Sp02 maintains stable with functional activity, pt ambulating and completing ADLs with Supervision for safety. To this date, pt has met 4/5 goals, anticipate pt can complete 3 step iADL without need for assist based on current functioning and level of improved cognition. Pt demonstrates no SOB or DOE with activity, reports being back to baseline. Pt has no further acute skilled OT needs at this time. DC plans updated for no follow-up OT.       If plan is discharge home, recommend the following:  Other (comment) (n/a)   Equipment Recommendations  None recommended by OT    Recommendations for Other Services      Precautions / Restrictions Precautions Precautions: Fall;Other (comment) Restrictions Weight Bearing Restrictions: No       Mobility Bed Mobility               General bed mobility comments: Pt rec sitting EOB and was left sitting in chair at end of session    Transfers Overall transfer level: Needs assistance Equipment used: None Transfers: Sit to/from Stand Sit to Stand: Supervision           General transfer comment: Supervision for safety     Balance Overall balance assessment: Mild deficits observed, not formally tested Sitting-balance support: Feet supported, No upper extremity supported Sitting balance-Leahy Scale: Good     Standing balance support: Reliant on assistive device for  balance, No upper extremity supported Standing balance-Leahy Scale: Good                             ADL either performed or assessed with clinical judgement   ADL Overall ADL's : Modified independent     Grooming: Standing;Supervision/safety;Oral care       Lower Body Bathing: Sitting/lateral leans;Modified independent   Upper Body Dressing : Standing;Modified independent   Lower Body Dressing: Sit to/from stand;Supervision/safety               Functional mobility during ADLs: Supervision/safety General ADL Comments: Pt ambulating in hall with supervision for safety    Extremity/Trunk Assessment              Vision       Perception     Praxis      Cognition Arousal: Alert Behavior During Therapy: WFL for tasks assessed/performed Overall Cognitive Status: Within Functional Limits for tasks assessed                                          Exercises      Shoulder Instructions       General Comments Pt Sp02 94% on RA and with functional activity, persists with cough that is anticipated to clear up over time. Pt denies SOB    Pertinent Vitals/ Pain       Pain Assessment Pain Assessment: No/denies pain  Home Living  Prior Functioning/Environment              Frequency  Min 1X/week        Progress Toward Goals  OT Goals(current goals can now be found in the care plan section)  Progress towards OT goals: Progressing toward goals;Goals met and updated - see care plan  Acute Rehab OT Goals Patient Stated Goal: get better OT Goal Formulation: With patient Time For Goal Achievement: 07/20/23 Potential to Achieve Goals: Good  Plan      Co-evaluation                 AM-PAC OT "6 Clicks" Daily Activity     Outcome Measure   Help from another person eating meals?: None Help from another person taking care of personal grooming?: A  Little Help from another person toileting, which includes using toliet, bedpan, or urinal?: A Little Help from another person bathing (including washing, rinsing, drying)?: None Help from another person to put on and taking off regular upper body clothing?: None Help from another person to put on and taking off regular lower body clothing?: None 6 Click Score: 22    End of Session    OT Visit Diagnosis: Unsteadiness on feet (R26.81);Other abnormalities of gait and mobility (R26.89);Muscle weakness (generalized) (M62.81)   Activity Tolerance Patient tolerated treatment well   Patient Left with call bell/phone within reach;in chair   Nurse Communication Mobility status        Time: 0865-7846 OT Time Calculation (min): 12 min  Charges: OT General Charges $OT Visit: 1 Visit OT Treatments $Therapeutic Activity: 8-22 mins  07/12/2023  AB, OTR/L  Acute Rehabilitation Services  Office: [phone]   Tristan Schroeder 07/12/2023, 11:09 AM

## 2023-07-12 NOTE — Progress Notes (Signed)
Modified Barium Swallow Study  Patient Details  Name: Casey [name] MRN: [id number] Date of Birth: 29-Nov-1960  Today's Date: 07/12/2023  Modified Barium Swallow completed.  Full report located under Chart Review in the Imaging Section.  History of Present Illness Casey [name] is a 62 yo female who presented to [facility] ED 8/16 after being found at home unresponsive. Per note, pt's son administered "at home narcan", which is vinegar, while pt was unresponsive. EMS administered Narcan with improvement in mental status. In ED, was hypothermic, tachycardic, SpO2 89% on NRB. UDS + for cocaine, BZD, and THC. CXR with asymmetric bibasilar interstitial and airspace infiltrates c/f multifocal PNA/aspiration. On 8/17, pt with persistent respiratory distress with escalating O2 requirements to HFNC and eventually BiPAP and worsening CXR with rapid progression of bilateral heterogenous consolidative airspace disease in LUL and R lung. Transferred to [facility] 8/17. Required ETT 8/20-25. FEES 8/26 recommended nectar thick liquids. Extubated to HHFNC 55L. PMH includes chronic pain, anxiety/depression, panic disorders, suicide attempt, polysubstance abuse   Clinical Impression Pt demonstrates improvement in airway protection. Swallow initiation still slightly late resulting in consistent trace penetration with ejection over multiple swallows. Pt had one instance of aspiration when trying to take a large pill; she gagged while holding glottic closure and had mild aspiration which she sensed and ejected. Advised large pills whole in puree. Overall sensation and respiratory endurance have improved. Pt may resume a regular diet and thin liquids. No SLP f/u needed will sign off. Factors that may increase risk of adverse event in presence of aspiration Rubye Oaks & Clearance Coots 2021):    Swallow Evaluation Recommendations Recommendations: PO diet PO Diet Recommendation: Regular;Thin liquids (Level 0) Liquid Administration via:  Cup;Straw Medication Administration: Whole meds with puree Swallowing strategies  : Slow rate Postural changes: Position pt fully upright for meals Oral care recommendations: Oral care BID (2x/day)      Sharrell Krawiec, Riley Nearing 07/12/2023,8:55 AM

## 2023-07-12 NOTE — Plan of Care (Signed)
  Problem: Acute Rehab OT Goals (only OT should resolve) Goal: Pt. Will Transfer To Toilet Outcome: Adequate for Discharge   Problem: Acute Rehab OT Goals (only OT should resolve) Goal: Pt. Will Perform Grooming Outcome: Completed/Met Goal: Pt. Will Perform Lower Body Dressing Outcome: Completed/Met Goal: OT Additional ADL Goal #1 Outcome: Completed/Met

## 2023-07-12 NOTE — Discharge Summary (Signed)
PATIENT DETAILS Name: Casey [name] Age: 62 y.o. Sex: female Date of Birth: 1961-08-26 MRN: [id number]. Admitting Physician: Lilyan Gilford, DO RWE:[id number], No Pcp Per  Admit Date: 06/25/2023 Discharge date: 07/12/2023  Recommendations for Outpatient Follow-up:  Follow up with PCP in 1-2 weeks Please obtain CMP/CBC in one week   Admitted From:  Home  Disposition: Home health   Discharge Condition: good  CODE STATUS:   Code Status: Full Code   Diet recommendation:  Diet Order             Diet regular Fluid consistency: Thin  Diet effective now           Diet - low sodium heart healthy                    Brief Summary: Patient is a 62 y.o.  female with history of anxiety/depression, chronic pain syndrome, polysubstance abuse (cocaine/methamphetamine/marijuana) who presented to [facility] on 8/16 with shortness of breath-she was found to have acute hypoxic respiratory failure in the setting of multifocal pneumonia, she was subsequently admitted to the hospitalist service-she developed further hypoxemia/encephalopathy and was intubated and subsequently transferred to [facility] ICU.  She was provided supportive care-extubated-and subsequently transferred to [facility] on 8/31.  See below for further details    Significant events: 8/16>> Presented to [facility] with AMS/hypoxia-multifocal pneumonia on CXR-initially on Cuming then NRB then HFNC.   8/17>> worsening dyspnea-started on BiPAP.  Transferred to [facility].   8/20 >> intubated  8/25 >> extubated to heated high flow 8/31>> transferred to [facility]   Significant studies: 8/31>> CXR: Multifocal PNA   Significant microbiology data: 8/19>> respiratory virus panel: Negative 8/19>> blood culture: No growth 8/22>> tracheal aspirate culture: Rare Candida (likely colonization) 8/24>> tracheal aspirate: No growth   Procedures: 8/17-8/25>> ETT  Consultants:  PCCM  Brief Hospital Course: Severe sepsis acute hypoxic respiratory failure due to  multifocal PNA-likely secondary to aspiration Sepsis physiology resolved Titrated to room air on 9/1 Remains stable-transition from Unasyn to Augmentin PCP to repeat two-view chest x-ray in 4 to 6 weeks to document resolution of PNA.   Acute metabolic encephalopathy Secondary to pneumonia/hypoxemia-in the setting of chronic opiate/drug use Encephalopathy has resolved-she is completely awake and alert. Was on Seroquel-Klonopin-both of these medications have been gradually tapered down-does not require either on discharge.     Normocytic anemia Due to critical illness No evidence of blood loss Transfuse if significant drop   Acute urinary retention Foley catheter removed in the ICU Bethanechol discontinued 8/31-voiding without any issues.    Oropharyngeal dysphagia In the setting of critical illness/debility/deconditioning Significant improvement in encephalopathy/deconditioning over the past several days-subsequent NG tube discontinued-SLP followed closely-started on regular diet on discharge.   Fall 8/26 with dental injury Reviewed ICU note-plans are for outpatient dentistry follow-up   History of anxiety/panic disorder Apparently taking Xanax that she buys off the streets Change Klonopin to as needed dosing-no plans to prescribe Klonopin on discharge Continue Celexa   Polysubstance abuse (cocaine/methamphetamine/marijuana) Counseled  Insomnia Good response to melatonin last night-continue on discharge Minimize benzos/Ambien use in the future.   Debility/deconditioning Secondary to acute illness Initially evaluated by PT/OT-CIR was recommended but patient declining SNF/CIR-ambulated more than 220 feet with me this morning-will discharge home with home health.   Nutrition Status: Nutrition Problem: Moderate Malnutrition Etiology: social / environmental circumstances (polysubstance abuse) Signs/Symptoms: mild fat depletion, mild muscle depletion Interventions: Tube  feeding, MVI, Other (Comment) (Banatrol TF)   BMI: Estimated body  mass index is 22.09 kg/m as calculated from the following:   Height as of this encounter: 5\' 5"  (1.651 m).   Weight as of this encounter: 60.2 kg.   Discharge Diagnoses:  Principal Problem:   Aspiration pneumonia (HCC) Active Problems:   Depression, recurrent (HCC)   GAD (generalized anxiety disorder)   Acute respiratory failure with hypoxia (HCC)   Polysubstance abuse (HCC)   Acute metabolic encephalopathy   Malnutrition of moderate degree   Nausea and vomiting   COPD with acute exacerbation (HCC)   Anemia   Pneumonia of right lower lobe due to infectious organism   Delirium   Vomiting without nausea   Pneumonia of right upper lobe due to infectious organism   Discharge Instructions:  Activity:  As tolerated  Discharge Instructions     Call MD for:  difficulty breathing, headache or visual disturbances   Complete by: As directed    Diet - low sodium heart healthy   Complete by: As directed    Discharge instructions   Complete by: As directed    Follow with Primary MD  in 1-2 weeks  Please ask your primary care practitioner to repeat two-view chest x-ray in 4 to 6 weeks to document resolution of pneumonia.  Please abstain from any other illicit drug use  Please get a complete blood count and chemistry panel checked by your Primary MD at your next visit, and again as instructed by your Primary MD.  Get Medicines reviewed and adjusted: Please take all your medications with you for your next visit with your Primary MD  Laboratory/radiological data: Please request your Primary MD to go over all hospital tests and procedure/radiological results at the follow up, please ask your Primary MD to get all Hospital records sent to his/her office.  In some cases, they will be blood work, cultures and biopsy results pending at the time of your discharge. Please request that your primary care M.D. follows up on  these results.  Also Note the following: If you experience worsening of your admission symptoms, develop shortness of breath, life threatening emergency, suicidal or homicidal thoughts you must seek medical attention immediately by calling 911 or calling your MD immediately  if symptoms less severe.  You must read complete instructions/literature along with all the possible adverse reactions/side effects for all the Medicines you take and that have been prescribed to you. Take any new Medicines after you have completely understood and accpet all the possible adverse reactions/side effects.   Do not drive when taking Pain medications or sleeping medications (Benzodaizepines)  Do not take more than prescribed Pain, Sleep and Anxiety Medications. It is not advisable to combine anxiety,sleep and pain medications without talking with your primary care practitioner  Special Instructions: If you have smoked or chewed Tobacco  in the last 2 yrs please stop smoking, stop any regular Alcohol  and or any Recreational drug use.  Wear Seat belts while driving.  Please note: You were cared for by a hospitalist during your hospital stay. Once you are discharged, your primary care physician will handle any further medical issues. Please note that NO REFILLS for any discharge medications will be authorized once you are discharged, as it is imperative that you return to your primary care physician (or establish a relationship with a primary care physician if you do not have one) for your post hospital discharge needs so that they can reassess your need for medications and monitor your lab values.   Increase activity  slowly   Complete by: As directed       Allergies as of 07/12/2023       Reactions   Ampicillin Other (See Comments)   Itching, tolerated Unasyn 06/2023         Medication List     TAKE these medications    albuterol 108 (90 Base) MCG/ACT inhaler Commonly known as: VENTOLIN HFA Inhale 2  puffs into the lungs every 6 (six) hours as needed for wheezing or shortness of breath.   amoxicillin-clavulanate 875-125 MG tablet Commonly known as: AUGMENTIN Take 1 tablet by mouth 2 (two) times daily for 2 days.   benzonatate 100 MG capsule Commonly known as: Tessalon Perles Take 1 capsule (100 mg total) by mouth 3 (three) times daily as needed for cough.   citalopram 20 MG tablet Commonly known as: CELEXA TAKE 1 TABLET BY MOUTH EVERY DAY   hydrOXYzine 50 MG tablet Commonly known as: ATARAX Take 1 tablet (50 mg total) by mouth 3 (three) times daily as needed.   Melatonin 10 MG Tabs Take 10 mg by mouth at bedtime.        Follow-up Information     Primary care MD. Schedule an appointment as soon as possible for a visit in 1 week(s).                 Allergies  Allergen Reactions   Ampicillin Other (See Comments)    Itching, tolerated Unasyn 06/2023      Other Procedures/Studies: DG Swallowing Func-Speech Pathology  Result Date: 07/12/2023 Table formatting from the original result was not included. Modified Barium Swallow Study Patient Details Name: Casey [name] MRN: [id number] Date of Birth: 1961/01/12 Today's Date: 07/12/2023 HPI/PMH: History of Present Illness Marlaine Ya Crupi is a 62 yo female who presented to [facility] ED 8/16 after being found at home unresponsive. Per note, pt's son administered "at home narcan", which is vinegar, while pt was unresponsive. EMS administered Narcan with improvement in mental status. In ED, was hypothermic, tachycardic, SpO2 89% on NRB. UDS + for cocaine, BZD, and THC. CXR with asymmetric bibasilar interstitial and airspace infiltrates c/f multifocal PNA/aspiration. On 8/17, pt with persistent respiratory distress with escalating O2 requirements to HFNC and eventually BiPAP and worsening CXR with rapid progression of bilateral heterogenous consolidative airspace disease in LUL and R lung. Transferred to [facility] 8/17. Required ETT 8/20-25. FEES 8/26  recommended nectar thick liquids. Extubated to HHFNC 55L. PMH includes chronic pain, anxiety/depression, panic disorders, suicide attempt, polysubstance abuse Clinical Impression Pt demonstrates improvement in airway protection. Swallow initiation still slightly late resulting in consistent trace penetration with ejection over multiple swallows. Pt had one instance of aspiration when trying to take a large pill; she gagged while holding glottic closure and had mild aspiration which she sensed and ejected. Advised large pills whole in puree. Overall sensation and respiratory endurance have improved. Pt may resume a regular diet and thin liquids. No SLP f/u needed will sign off. Factors that may increase risk of adverse event in presence of aspiration Rubye Oaks [employer] Clearance Coots 2021): No data recorded Recommendations/Plan: Swallowing Evaluation Recommendations Swallowing Evaluation Recommendations Recommendations: PO diet PO Diet Recommendation: Regular; Thin liquids (Level 0) Liquid Administration via: Cup; Straw Medication Administration: Whole meds with puree Swallowing strategies  : Slow rate Postural changes: Position pt fully upright for meals Oral care recommendations: Oral care BID (2x/day) Treatment Plan Treatment Plan Treatment recommendations: No treatment recommended at this time Follow-up recommendations: No SLP follow up Recommendations Recommendations  for follow up therapy are one component of a multi-disciplinary discharge planning process, led by the attending physician.  Recommendations may be updated based on patient status, additional functional criteria and insurance authorization. Assessment: Orofacial Exam: No data recorded Anatomy: Anatomy: WFL Boluses Administered: Boluses Administered Boluses Administered: Thin liquids (Level 0); Puree; Solid  Oral Impairment Domain: Oral Impairment Domain Lip Closure: No labial escape Tongue control during bolus hold: Cohesive bolus between tongue to palatal seal  Bolus preparation/mastication: Timely and efficient chewing and mashing Bolus transport/lingual motion: Brisk tongue motion Oral residue: Complete oral clearance Initiation of pharyngeal swallow : Pyriform sinuses  Pharyngeal Impairment Domain: Pharyngeal Impairment Domain Soft palate elevation: No bolus between soft palate (SP)/pharyngeal wall (PW) Laryngeal elevation: Complete superior movement of thyroid cartilage with complete approximation of arytenoids to epiglottic petiole Anterior hyoid excursion: Complete anterior movement Epiglottic movement: Complete inversion Laryngeal vestibule closure: Complete, no air/contrast in laryngeal vestibule Pharyngeal stripping wave : Present - complete Pharyngeal contraction (A/P view only): N/A Pharyngoesophageal segment opening: Complete distension and complete duration, no obstruction of flow Tongue base retraction: No contrast between tongue base and posterior pharyngeal wall (PPW) Pharyngeal residue: Complete pharyngeal clearance  Esophageal Impairment Domain: No data recorded Pill: Pill Consistency administered: Thin liquids (Level 0) Thin liquids (Level 0): Impaired (see clinical impressions) (Pt gagged and aspirated with sensation, ejected thin) Penetration/Aspiration Scale Score: Penetration/Aspiration Scale Score 1.  Material does not enter airway: Puree; Solid 3.  Material enters airway, remains ABOVE vocal cords and not ejected out: Thin liquids (Level 0) 6.  Material enters airway, passes BELOW cords then ejected out: Thin liquids (Level 0) Compensatory Strategies: No data recorded  General Information: Caregiver present: No  Diet Prior to this Study: Dysphagia 3 (mechanical soft); Mildly thick liquids (Level 2, nectar thick)   Temperature : Normal   Respiratory Status: WFL   Supplemental O2: None (Room air)   History of Recent Intubation: Yes  Behavior/Cognition: Alert; Cooperative; Pleasant mood Self-Feeding Abilities: Able to self-feed Baseline vocal  quality/speech: Normal Volitional Cough: Able to elicit Volitional Swallow: Able to elicit Exam Limitations: No limitations Goal Planning: No data recorded No data recorded No data recorded No data recorded No data recorded Pain: No data recorded End of Session: Start Time:SLP Start Time (ACUTE ONLY): 0830 Stop Time: SLP Stop Time (ACUTE ONLY): 0840 Time Calculation:SLP Time Calculation (min) (ACUTE ONLY): 10 min Charges: SLP Evaluations $ SLP Speech Visit: 1 Visit SLP Evaluations $MBS Swallow: 1 Procedure SLP visit diagnosis: SLP Visit Diagnosis: Dysphagia, oropharyngeal phase (R13.12) Past Medical History: Past Medical History: Diagnosis Date  Anxiety   Chronic back pain   Depression   History of panic attacks 09/11/2014  Medical history non-contributory   Polysubstance abuse (HCC)   History of taking non-prescribed opiates, BZDs; also +cocaine and THC  Ruptured disc, thoracic   Suicide attempt (HCC) 2002 Past Surgical History: Past Surgical History: Procedure Laterality Date  CESAREAN SECTION    CLAVICLE SURGERY    REVERSE SHOULDER ARTHROPLASTY Left 01/01/2020  Procedure: LEFT REVERSE SHOULDER ARTHROPLASTY;  Surgeon: Cammy Copa, MD;  Location: [facility] OR;  Service: Orthopedics;  Laterality: Left;  TUBAL LIGATION   DeBlois, Riley Nearing 07/12/2023, 8:57 AM  DG CHEST PORT 1 VIEW  Result Date: 07/08/2023 CLINICAL DATA:  Respiratory failure.  Pneumonia. EXAM: PORTABLE CHEST 1 VIEW COMPARISON:  July 04, 2023. FINDINGS: The heart size and mediastinal contours are within normal limits. Status post left shoulder arthroplasty. Endotracheal tube has been removed. Feeding tube is seen  entering stomach. Stable bilateral lung opacities are noted, right greater than left, consistent with multifocal pneumonia. IMPRESSION: Stable bilateral lung opacities consistent with multifocal pneumonia. Electronically Signed   By: Lupita Raider M.D.   On: 07/08/2023 14:48   DG Abd 1 View  Result Date: 07/06/2023 CLINICAL  DATA:  Vomiting and abdominal pain.  Tube placement. EXAM: ABDOMEN - 1 VIEW COMPARISON:  06/30/2023 FINDINGS: Soft feeding tube has its tip in the midportion of the stomach. There is no sign of small or large bowel obstruction. No evidence of free air. No abnormal calcifications or acute bone findings. IMPRESSION: Soft feeding tube tip in the midportion of the stomach. Electronically Signed   By: Paulina Fusi M.D.   On: 07/06/2023 18:23   DG Chest Port 1 View  Result Date: 07/04/2023 CLINICAL DATA:  Respiratory failure. EXAM: PORTABLE CHEST 1 VIEW COMPARISON:  Chest radiographs 07/03/2023 FINDINGS: An endotracheal tube and right upper extremity PICC are unchanged. A feeding tube courses into the abdomen with tip not imaged. The cardiomediastinal silhouette is unchanged allowing for mild rightward patient rotation on the current examination. Widespread interstitial and patchy, predominantly peripheral airspace opacities in both lungs have not significantly changed. No sizable pleural effusion or pneumothorax is identified. IMPRESSION: Unchanged widespread bilateral lung infiltrates. Electronically Signed   By: Sebastian Ache M.D.   On: 07/04/2023 12:33   DG Chest Port 1 View  Result Date: 07/03/2023 CLINICAL DATA:  Respirator dependent EXAM: PORTABLE CHEST 1 VIEW COMPARISON:  07/01/2023 FINDINGS: Unchanged support apparatus including endotracheal tube, partially imaged enteric feeding tube, and right upper extremity PICC. Mild cardiomegaly with unchanged diffuse interstitial and heterogeneous airspace opacity, more conspicuous in the right lung. Status post left shoulder reverse arthroplasty. IMPRESSION: 1. Unchanged support apparatus including endotracheal tube, partially imaged enteric feeding tube, and right upper extremity PICC. 2. Mild cardiomegaly with unchanged diffuse interstitial and heterogeneous airspace opacity, more conspicuous in the right lung. No new airspace opacity. Electronically Signed   By:  Jearld Lesch M.D.   On: 07/03/2023 12:17   DG Chest Port 1 View  Result Date: 07/01/2023 CLINICAL DATA:  Respiratory failure EXAM: PORTABLE CHEST 1 VIEW COMPARISON:  06/29/2023. FINDINGS: Endotracheal tube tip just below thoracic inlet. Right-sided PICC tip distal SVC. Feeding tube tip below the diaphragm and off x-ray. Unremarkable cardiac silhouette. There is vascular congestion. Areas of alveolar opacity may represent pneumonitis or edema with interval stability. IMPRESSION: Stable patchy interstitial and alveolar opacities that may represent atypical pneumonia or edema. Electronically Signed   By: Layla Maw M.D.   On: 07/01/2023 09:53   DG Abd Portable 1V  Result Date: 06/30/2023 CLINICAL DATA:  Feeding tube placement EXAM: PORTABLE ABDOMEN - 1 VIEW COMPARISON:  09/04/2019 FINDINGS: The feeding tube tip is in the stomach antrum, oriented towards the pyloric channel. Visualized bowel gas pattern is unremarkable. IMPRESSION: 1. Feeding tube tip is in the stomach antrum, oriented towards the pyloric channel. Electronically Signed   By: Gaylyn Rong M.D.   On: 06/30/2023 16:35   [place] EKG SITE RITE  Result Date: 06/30/2023 If Site Rite image not attached, placement could not be confirmed due to current cardiac rhythm.  DG CHEST PORT 1 VIEW  Result Date: 06/29/2023 CLINICAL DATA:  Encounter for intubation. EXAM: PORTABLE CHEST 1 VIEW COMPARISON:  June 29, 2023 FINDINGS: Endotracheal tube terminates 4.7 cm above the carina. Enteric catheter folds on itself within the gastric body/cardia. No evidence of pneumothorax. Diffuse bilateral patchy airspace consolidation, similar  to patient's prior radiographs. IMPRESSION: 1. Endotracheal tube terminates 4.7 cm above the carina. 2. Enteric catheter folds on itself within the gastric body/cardia. 3. Diffuse bilateral patchy airspace consolidation, similar to patient's prior radiographs. Electronically Signed   By: Ted Mcalpine M.D.   On:  06/29/2023 12:43   DG CHEST PORT 1 VIEW  Result Date: 06/29/2023 CLINICAL DATA:  62 year old female with history of shortness of breath and hypoxia. EXAM: PORTABLE CHEST 1 VIEW COMPARISON:  Chest x-ray 06/28/2023. FINDINGS: Widespread interstitial prominence, peribronchial cuffing, and patchy multifocal airspace consolidation scattered asymmetrically throughout the lungs bilaterally (right greater than left) with relative sparing of the left upper lobe. Overall, aeration appears slightly improved compared to the prior study. No definite pleural effusions. No pneumothorax. Pulmonary vasculature does not appear engorged. Heart size is normal. The patient is rotated to the right on today's exam, resulting in distortion of the mediastinal contours and reduced diagnostic sensitivity and specificity for mediastinal pathology. Status post left shoulder arthroplasty. IMPRESSION: 1. Severe multilobar bilateral pneumonia redemonstrated (right greater than left) with slightly improved aeration compared to the prior study. 2. Previously noted right-sided pleural effusion has resolved. 3. Aortic atherosclerosis. Electronically Signed   By: Trudie Reed M.D.   On: 06/29/2023 07:28   DG CHEST PORT 1 VIEW  Result Date: 06/28/2023 CLINICAL DATA:  Hypoxia EXAM: PORTABLE CHEST 1 VIEW COMPARISON:  Chest x-ray 06/27/2023 FINDINGS: Cardiomediastinal silhouette is stable. Bilateral multifocal airspace opacities have not significantly changed. Small right pleural effusion persists. There is no pneumothorax. The osseous structures are stable. IMPRESSION: Unchanged bilateral multifocal airspace opacities and small right pleural effusion. Electronically Signed   By: Darliss Cheney M.D.   On: 06/28/2023 16:39   DG Chest Port 1 View  Result Date: 06/27/2023 CLINICAL DATA:  Multifocal pneumonia. EXAM: PORTABLE CHEST 1 VIEW COMPARISON:  06/26/2023 FINDINGS: Rightward patient rotation. The right greater than left patchy and nodular  airspace opacities are similar in the interval. Underlying diffuse interstitial opacity again noted. The cardio pericardial silhouette is enlarged. Bones are diffusely demineralized. Telemetry leads overlie the chest. IMPRESSION: 1. No substantial interval change in the right greater than left patchy and nodular airspace opacities compatible with asymmetric edema or diffuse infection. 2. Underlying diffuse interstitial opacity again noted. Electronically Signed   By: Kennith Center M.D.   On: 06/27/2023 07:46   DG CHEST PORT 1 VIEW  Result Date: 06/26/2023 CLINICAL DATA:  Chest pain, shortness of breath EXAM: PORTABLE CHEST 1 VIEW COMPARISON:  06/26/2023 FINDINGS: Diffuse bilateral heterogeneous and consolidative airspace disease, increased in the peripheral left upper lobe, similar throughout the right lung. Small, layering right pleural effusion. Heart and mediastinum are normal. Status post left shoulder reverse arthroplasty. IMPRESSION: 1. Diffuse bilateral heterogeneous and consolidative airspace disease, increased in the peripheral left upper lobe, similar throughout the right lung. Findings are consistent with worsened multifocal pneumonia or edema. 2. Small, layering right pleural effusion. Electronically Signed   By: Jearld Lesch M.D.   On: 06/26/2023 18:06   DG CHEST PORT 1 VIEW  Result Date: 06/26/2023 CLINICAL DATA:  Tachypnea EXAM: PORTABLE CHEST 1 VIEW COMPARISON:  06/25/2023 FINDINGS: Multifocal pulmonary infiltrates have rapidly progressed in the interval, asymmetrically more severe throughout the right lung, in keeping with progressive multifocal pneumonic infiltrate. No pneumothorax or pleural effusion. Cardiac size within normal limits. No acute bone abnormality. IMPRESSION: 1. Rapid interval progression of multifocal pneumonic infiltrate. Electronically Signed   By: Helyn Numbers M.D.   On: 06/26/2023 01:11  DG Chest 2 View  Result Date: 06/25/2023 CLINICAL DATA:  Dyspnea EXAM:  CHEST - 2 VIEW COMPARISON:  09/04/2019 FINDINGS: Asymmetric bibasilar interstitial and airspace infiltrates are present, more focal within the right mid lung zone in keeping with changes of multifocal pneumonia and/or aspiration in the appropriate clinical setting. No pneumothorax or pleural effusion. Cardiac size within normal limits. Pulmonary vascularity is normal. No acute bone abnormality. Left total shoulder arthroplasty is partially visualized. IMPRESSION: 1. Asymmetric bibasilar interstitial and airspace infiltrates, more focal within the right mid lung zone in keeping with changes of multifocal pneumonia and/or aspiration in the appropriate clinical setting. Electronically Signed   By: Helyn Numbers M.D.   On: 06/25/2023 21:31     TODAY-DAY OF DISCHARGE:  Subjective:   [place] today has no headache,no chest abdominal pain,no new weakness tingling or numbness, feels much better wants to go home today.   Objective:   Blood pressure 132/86, pulse 97, temperature 97.6 F (36.4 C), temperature source Oral, resp. rate 18, height 5\' 5"  (1.651 m), weight 65.5 kg, SpO2 92%.  Intake/Output Summary (Last 24 hours) at 07/12/2023 1048 Last data filed at 07/11/2023 1310 Gross per 24 hour  Intake 290 ml  Output --  Net 290 ml   Filed Weights   07/10/23 0500 07/11/23 0300 07/12/23 0500  Weight: 59.8 kg 60.2 kg 65.5 kg    Exam: Awake Alert, Oriented *3, No new F.N deficits, Normal affect [place].AT,PERRAL Supple Neck,No JVD, No cervical lymphadenopathy appriciated.  Symmetrical Chest wall movement, Good air movement bilaterally, CTAB RRR,No Gallops,Rubs or new Murmurs, No Parasternal Heave +ve B.Sounds, Abd Soft, Non tender, No organomegaly appriciated, No rebound -guarding or rigidity. No Cyanosis, Clubbing or edema, No new Rash or bruise   PERTINENT RADIOLOGIC STUDIES: DG Swallowing Func-Speech Pathology  Result Date: 07/12/2023 Table formatting from the original result was not included.  Modified Barium Swallow Study Patient Details Name: Casey [name] MRN: [id number] Date of Birth: Apr 09, 1961 Today's Date: 07/12/2023 HPI/PMH: History of Present Illness Kerri-Ann Wildes Schrotenboer is a 62 yo female who presented to [facility] ED 8/16 after being found at home unresponsive. Per note, pt's son administered "at home narcan", which is vinegar, while pt was unresponsive. EMS administered Narcan with improvement in mental status. In ED, was hypothermic, tachycardic, SpO2 89% on NRB. UDS + for cocaine, BZD, and THC. CXR with asymmetric bibasilar interstitial and airspace infiltrates c/f multifocal PNA/aspiration. On 8/17, pt with persistent respiratory distress with escalating O2 requirements to HFNC and eventually BiPAP and worsening CXR with rapid progression of bilateral heterogenous consolidative airspace disease in LUL and R lung. Transfer[facility] to [facility] 8/17. Required ETT 8/20-25. FEES 8/26 recommended nectar thick liquids. Extubated to HHFNC 55L. PMH includes chronic pain, anxiety/depression, panic disorders, suicide attempt, polysubstance abuse Clinical Impression Pt demonstrates improvement in airway protection. Swallow initiation still slightly late resulting in consistent trace penetration with ejection over multiple swallows. Pt had one instance of aspiration when trying to take a large pill; she gagged while holding glottic closure and had mild aspiration which she sensed and ejected. Advised large pills whole in puree. Overall sensation and respiratory endurance have improved. Pt may resume a regular diet and thin liquids. No SLP f/u needed will sign off. Factors that may increase risk of adverse event in presence of aspiration Rubye Oaks & Clearance Coots 2021): No data recorded Recommendations/Plan: Swallowing Evaluation Recommendations Swallowing Evaluation Recommendations Recommendations: PO diet PO Diet Recommendation: Regular; Thin liquids (Level 0) Liquid Administration via: Cup; Straw Medication  Administration: Whole meds  with puree Swallowing strategies  : Slow rate Postural changes: Position pt fully upright for meals Oral care recommendations: Oral care BID (2x/day) Treatment Plan Treatment Plan Treatment recommendations: No treatment recommended at this time Follow-up recommendations: No SLP follow up Recommendations Recommendations for follow up therapy are one component of a multi-disciplinary discharge planning process, led by the attending physician.  Recommendations may be updated based on patient status, additional functional criteria and insurance authorization. Assessment: Orofacial Exam: No data recorded Anatomy: Anatomy: WFL Boluses Administered: Boluses Administered Boluses Administered: Thin liquids (Level 0); Puree; Solid  Oral Impairment Domain: Oral Impairment Domain Lip Closure: No labial escape Tongue control during bolus hold: Cohesive bolus between tongue to palatal seal Bolus preparation/mastication: Timely and efficient chewing and mashing Bolus transport/lingual motion: Brisk tongue motion Oral residue: Complete oral clearance Initiation of pharyngeal swallow : Pyriform sinuses  Pharyngeal Impairment Domain: Pharyngeal Impairment Domain Soft palate elevation: No bolus between soft palate (SP)/pharyngeal wall (PW) Laryngeal elevation: Complete superior movement of thyroid cartilage with complete approximation of arytenoids to epiglottic petiole Anterior hyoid excursion: Complete anterior movement Epiglottic movement: Complete inversion Laryngeal vestibule closure: Complete, no air/contrast in laryngeal vestibule Pharyngeal stripping wave : Present - complete Pharyngeal contraction (A/P view only): N/A Pharyngoesophageal segment opening: Complete distension and complete duration, no obstruction of flow Tongue base retraction: No contrast between tongue base and posterior pharyngeal wall (PPW) Pharyngeal residue: Complete pharyngeal clearance  Esophageal Impairment Domain: No data recorded Pill: Pill  Consistency administered: Thin liquids (Level 0) Thin liquids (Level 0): Impaired (see clinical impressions) (Pt gagged and aspirated with sensation, ejected thin) Penetration/Aspiration Scale Score: Penetration/Aspiration Scale Score 1.  Material does not enter airway: Puree; Solid 3.  Material enters airway, remains ABOVE vocal cords and not ejected out: Thin liquids (Level 0) 6.  Material enters airway, passes BELOW cords then ejected out: Thin liquids (Level 0) Compensatory Strategies: No data recorded  General Information: Caregiver present: No  Diet Prior to this Study: Dysphagia 3 (mechanical soft); Mildly thick liquids (Level 2, nectar thick)   Temperature : Normal   Respiratory Status: WFL   Supplemental O2: None (Room air)   History of Recent Intubation: Yes  Behavior/Cognition: Alert; Cooperative; Pleasant mood Self-Feeding Abilities: Able to self-feed Baseline vocal quality/speech: Normal Volitional Cough: Able to elicit Volitional Swallow: Able to elicit Exam Limitations: No limitations Goal Planning: No data recorded No data recorded No data recorded No data recorded No data recorded Pain: No data recorded End of Session: Start Time:SLP Start Time (ACUTE ONLY): 0830 Stop Time: SLP Stop Time (ACUTE ONLY): 0840 Time Calculation:SLP Time Calculation (min) (ACUTE ONLY): 10 min Charges: SLP Evaluations $ SLP Speech Visit: 1 Visit SLP Evaluations $MBS Swallow: 1 Procedure SLP visit diagnosis: SLP Visit Diagnosis: Dysphagia, oropharyngeal phase (R13.12) Past Medical History: Past Medical History: Diagnosis Date  Anxiety   Chronic back pain   Depression   History of panic attacks 09/11/2014  Medical history non-contributory   Polysubstance abuse (HCC)   History of taking non-prescribed opiates, BZDs; also +cocaine and THC  Ruptured disc, thoracic   Suicide attempt (HCC) 2002 Past Surgical History: Past Surgical History: Procedure Laterality Date  CESAREAN SECTION    CLAVICLE SURGERY    REVERSE SHOULDER  ARTHROPLASTY Left 01/01/2020  Procedure: LEFT REVERSE SHOULDER ARTHROPLASTY;  Surgeon: Cammy Copa, MD;  Location: [facility] OR;  Service: Orthopedics;  Laterality: Left;  TUBAL LIGATION   DeBlois, Riley Nearing 07/12/2023, 8:57 AM    PERTINENT LAB  RESULTS: CBC: Recent Labs    07/11/23 0632 07/12/23 0319  WBC 14.7* 14.1*  HGB 10.9* 10.8*  HCT 33.7* 33.8*  PLT 773* 788*   CMET CMP     Component Value Date/Time   NA 135 07/11/2023 0632   NA 142 12/23/2022 1243   K 3.9 07/11/2023 0632   CL 102 07/11/2023 0632   CO2 25 07/11/2023 0632   GLUCOSE 131 (H) 07/11/2023 0632   BUN 12 07/11/2023 0632   BUN 7 (L) 12/23/2022 1243   CREATININE 0.70 07/11/2023 0632   CALCIUM 8.7 (L) 07/11/2023 0632   PROT 5.7 (L) 07/04/2023 0616   PROT 7.6 12/23/2022 1243   ALBUMIN 2.0 (L) 07/04/2023 0616   ALBUMIN 4.5 12/23/2022 1243   AST 12 (L) 07/04/2023 0616   ALT 16 07/04/2023 0616   ALKPHOS 67 07/04/2023 0616   BILITOT 0.2 (L) 07/04/2023 0616   BILITOT 0.2 12/23/2022 1243   EGFR 103 12/23/2022 1243   GFRNONAA >60 07/11/2023 0632    GFR Estimated Creatinine Clearance: 63.9 mL/min (by C-G formula based on SCr of 0.7 mg/dL). No results for input(s): "LIPASE", "AMYLASE" in the last 72 hours. No results for input(s): "CKTOTAL", "CKMB", "CKMBINDEX", "TROPONINI" in the last 72 hours. Invalid input(s): "POCBNP" No results for input(s): "DDIMER" in the last 72 hours. No results for input(s): "HGBA1C" in the last 72 hours. No results for input(s): "CHOL", "HDL", "LDLCALC", "TRIG", "CHOLHDL", "LDLDIRECT" in the last 72 hours. No results for input(s): "TSH", "T4TOTAL", "T3FREE", "THYROIDAB" in the last 72 hours.  Invalid input(s): "FREET3" No results for input(s): "VITAMINB12", "FOLATE", "FERRITIN", "TIBC", "IRON", "RETICCTPCT" in the last 72 hours. Coags: No results for input(s): "INR" in the last 72 hours.  Invalid input(s): "PT" Microbiology: Recent Results (from the past 240 hour(s))   Culture, Respiratory w Gram Stain     Status: None   Collection Time: 07/03/23  4:56 PM   Specimen: Tracheal Aspirate; Respiratory  Result Value Ref Range Status   Specimen Description TRACHEAL ASPIRATE  Final   Special Requests NONE  Final   Gram Stain   Final    FEW WBC PRESENT, PREDOMINANTLY PMN NO ORGANISMS SEEN    Culture   Final    RARE Normal respiratory flora-no Staph aureus or Pseudomonas seen Performed at [facility], [address], [place], [place] [address]    Report Status 07/06/2023 FINAL  Final    FURTHER DISCHARGE INSTRUCTIONS:  Get Medicines reviewed and adjusted: Please take all your medications with you for your next visit with your Primary MD  Laboratory/radiological data: Please request your Primary MD to go over all hospital tests and procedure/radiological results at the follow up, please ask your Primary MD to get all Hospital records sent to his/her office.  In some cases, they will be blood work, cultures and biopsy results pending at the time of your discharge. Please request that your primary care M.D. goes through all the records of your hospital data and follows up on these results.  Also Note the following: If you experience worsening of your admission symptoms, develop shortness of breath, life threatening emergency, suicidal or homicidal thoughts you must seek medical attention immediately by calling 911 or calling your MD immediately  if symptoms less severe.  You must read complete instructions/literature along with all the possible adverse reactions/side effects for all the Medicines you take and that have been prescribed to you. Take any new Medicines after you have completely understood and accpet all the  possible adverse reactions/side effects.   Do not drive when taking Pain medications or sleeping medications (Benzodaizepines)  Do not take more than prescribed Pain, Sleep and Anxiety Medications. It is not advisable to combine  anxiety,sleep and pain medications without talking with your primary care practitioner  Special Instructions: If you have smoked or chewed Tobacco  in the last 2 yrs please stop smoking, stop any regular Alcohol  and or any Recreational drug use.  Wear Seat belts while driving.  Please note: You were cared for by a hospitalist during your hospital stay. Once you are discharged, your primary care physician will handle any further medical issues. Please note that NO REFILLS for any discharge medications will be authorized once you are discharged, as it is imperative that you return to your primary care physician (or establish a relationship with a primary care physician if you do not have one) for your post hospital discharge needs so that they can reassess your need for medications and monitor your lab values.  Total Time spent coordinating discharge including counseling, education and face to face time equals greater than 30 minutes.  SignedJeoffrey Massed 07/12/2023 10:48 AM

## 2023-07-12 NOTE — Plan of Care (Signed)

## 2023-07-12 NOTE — TOC Progression Note (Signed)
Transition of Care [facility]) - Progression Note    Patient Details  Name: Casey [name] MRN: [id number] Date of Birth: Oct 20, 1961  [facility] CM/SW Contact  Elliot Cousin, RN Phone Number: [phone] 07/12/2023, 11:29 AM  Clinical Narrative:   CM spoke to pt at bedside. Offered choice for [facility]. Pt agreeable to [facility] agency that will accept insurance. Contacted [facility] rep, Amy with new referral waiting call back. Will arrange follow up appt with [facility] in [place]. Pt reports not having a PCP. Contacted [facility] rep, Mitch to have RW shipped to home. Her DIL will provide transportation home.     Expected Discharge Plan: Home w Home Health Services Barriers to Discharge: No Barriers Identified  Expected Discharge Plan and Services In-house Referral: Clinical Social Work Discharge Planning Services: CM Consult Post Acute Care Choice: Home Health Living arrangements for the past 2 months: Single Family Home Expected Discharge Date: 07/12/23               DME Arranged: Dan Humphreys rolling DME Agency: [facility]       HH Arranged: PT, OT HH Agency: [facility] Date HH Agency Contacted: 07/12/23 Time HH Agency Contacted: [id number] Representative spoke with at [facility] Agency: Amy Hyatt   Social Determinants of Health (SDOH) Interventions SDOH Screenings   Depression (PHQ2-9): Medium Risk (02/24/2023)  Tobacco Use: High Risk (05/28/2023)   Received from [facility]    Readmission Risk Interventions     No data to display

## 2023-07-12 NOTE — Plan of Care (Signed)
  Problem: Education: Goal: Knowledge of General Education information will improve Description: Including pain rating scale, medication(s)/side effects and non-pharmacologic comfort measures Outcome: Completed/Met   Problem: Health Behavior/Discharge Planning: Goal: Ability to manage health-related needs will improve Outcome: Completed/Met   Problem: Clinical Measurements: Goal: Ability to maintain clinical measurements within normal limits will improve Outcome: Completed/Met Goal: Will remain free from infection Outcome: Completed/Met Goal: Diagnostic test results will improve Outcome: Completed/Met Goal: Respiratory complications will improve Outcome: Completed/Met Goal: Cardiovascular complication will be avoided Outcome: Completed/Met   Problem: Activity: Goal: Risk for activity intolerance will decrease Outcome: Completed/Met   Problem: Nutrition: Goal: Adequate nutrition will be maintained Outcome: Completed/Met   Problem: Coping: Goal: Level of anxiety will decrease Outcome: Completed/Met   Problem: Elimination: Goal: Will not experience complications related to bowel motility Outcome: Completed/Met Goal: Will not experience complications related to urinary retention Outcome: Completed/Met   Problem: Pain Managment: Goal: General experience of comfort will improve Outcome: Completed/Met   Problem: Safety: Goal: Ability to remain free from injury will improve Outcome: Completed/Met   Problem: Skin Integrity: Goal: Risk for impaired skin integrity will decrease Outcome: Completed/Met   Problem: Safety: Goal: Non-violent Restraint(s) Outcome: Completed/Met   Problem: Activity: Goal: Ability to tolerate increased activity will improve Outcome: Completed/Met   Problem: Clinical Measurements: Goal: Ability to maintain a body temperature in the normal range will improve Outcome: Completed/Met   Problem: Respiratory: Goal: Ability to maintain adequate  ventilation will improve Outcome: Completed/Met Goal: Ability to maintain a clear airway will improve Outcome: Completed/Met

## 2023-07-17 ENCOUNTER — Inpatient Hospital Stay (HOSPITAL_COMMUNITY)
Admission: EM | Admit: 2023-07-17 | Discharge: 2023-07-19 | DRG: 190 | Disposition: A | Discharge status: Home / Self Care | Payer: MEDICAID | Attending: Internal Medicine | Admitting: Internal Medicine

## 2023-07-17 ENCOUNTER — Emergency Department (HOSPITAL_COMMUNITY): Payer: MEDICAID

## 2023-07-17 ENCOUNTER — Encounter (HOSPITAL_COMMUNITY): Payer: Self-pay

## 2023-07-17 ENCOUNTER — Other Ambulatory Visit: Payer: Self-pay

## 2023-07-17 DIAGNOSIS — Z8349 Family history of other endocrine, nutritional and metabolic diseases: Secondary | ICD-10-CM | POA: Diagnosis not present

## 2023-07-17 DIAGNOSIS — Z79899 Other long term (current) drug therapy: Secondary | ICD-10-CM | POA: Diagnosis not present

## 2023-07-17 DIAGNOSIS — J189 Pneumonia, unspecified organism: Secondary | ICD-10-CM

## 2023-07-17 DIAGNOSIS — F17210 Nicotine dependence, cigarettes, uncomplicated: Secondary | ICD-10-CM | POA: Diagnosis present

## 2023-07-17 DIAGNOSIS — Z96612 Presence of left artificial shoulder joint: Secondary | ICD-10-CM | POA: Diagnosis present

## 2023-07-17 DIAGNOSIS — R0602 Shortness of breath: Secondary | ICD-10-CM | POA: Diagnosis present

## 2023-07-17 DIAGNOSIS — Z823 Family history of stroke: Secondary | ICD-10-CM

## 2023-07-17 DIAGNOSIS — Z1152 Encounter for screening for COVID-19: Secondary | ICD-10-CM

## 2023-07-17 DIAGNOSIS — J441 Chronic obstructive pulmonary disease with (acute) exacerbation: Secondary | ICD-10-CM | POA: Diagnosis present

## 2023-07-17 DIAGNOSIS — Z820 Family history of epilepsy and other diseases of the nervous system: Secondary | ICD-10-CM

## 2023-07-17 DIAGNOSIS — J9601 Acute respiratory failure with hypoxia: Secondary | ICD-10-CM | POA: Diagnosis present

## 2023-07-17 DIAGNOSIS — Z8249 Family history of ischemic heart disease and other diseases of the circulatory system: Secondary | ICD-10-CM | POA: Diagnosis not present

## 2023-07-17 DIAGNOSIS — F1190 Opioid use, unspecified, uncomplicated: Secondary | ICD-10-CM | POA: Insufficient documentation

## 2023-07-17 DIAGNOSIS — Z833 Family history of diabetes mellitus: Secondary | ICD-10-CM

## 2023-07-17 DIAGNOSIS — Z818 Family history of other mental and behavioral disorders: Secondary | ICD-10-CM

## 2023-07-17 DIAGNOSIS — F1910 Other psychoactive substance abuse, uncomplicated: Secondary | ICD-10-CM | POA: Diagnosis present

## 2023-07-17 DIAGNOSIS — G894 Chronic pain syndrome: Secondary | ICD-10-CM | POA: Diagnosis present

## 2023-07-17 DIAGNOSIS — F1110 Opioid abuse, uncomplicated: Secondary | ICD-10-CM | POA: Diagnosis present

## 2023-07-17 HISTORY — DX: Pneumonia, unspecified organism: J18.9

## 2023-07-17 LAB — PROCALCITONIN: Procalcitonin: 0.25 ng/mL

## 2023-07-17 LAB — BASIC METABOLIC PANEL
Anion gap: 9 (ref 5–15)
BUN: 8 mg/dL (ref 8–23)
CO2: 21 mmol/L — ABNORMAL LOW (ref 22–32)
Calcium: 8.4 mg/dL — ABNORMAL LOW (ref 8.9–10.3)
Chloride: 107 mmol/L (ref 98–111)
Creatinine, Ser: 0.44 mg/dL (ref 0.44–1.00)
GFR, Estimated: >60 mL/min (ref >60)
Glucose, Bld: 106 mg/dL — ABNORMAL HIGH (ref 70–99)
Potassium: 3 mmol/L — ABNORMAL LOW (ref 3.5–5.1)
Sodium: 137 mmol/L (ref 135–145)

## 2023-07-17 LAB — CBC WITH DIFFERENTIAL/PLATELET
Abs Immature Granulocytes: 0.06 10*3/uL (ref 0.00–0.07)
Basophils Absolute: 0.1 10*3/uL (ref 0.0–0.1)
Basophils Relative: 0 %
Eosinophils Absolute: 0.3 10*3/uL (ref 0.0–0.5)
Eosinophils Relative: 2 %
HCT: 37.3 % (ref 36.0–46.0)
Hemoglobin: 12.1 g/dL (ref 12.0–15.0)
Immature Granulocytes: 0 %
Lymphocytes Relative: 21 %
Lymphs Abs: 3.1 10*3/uL (ref 0.7–4.0)
MCH: 27.8 pg (ref 26.0–34.0)
MCHC: 32.4 g/dL (ref 30.0–36.0)
MCV: 85.6 fL (ref 80.0–100.0)
Monocytes Absolute: 1.1 10*3/uL — ABNORMAL HIGH (ref 0.1–1.0)
Monocytes Relative: 8 %
Neutro Abs: 10.3 10*3/uL — ABNORMAL HIGH (ref 1.7–7.7)
Neutrophils Relative %: 69 %
Platelets: 900 10*3/uL — ABNORMAL HIGH (ref 150–400)
RBC: 4.36 MIL/uL (ref 3.87–5.11)
RDW: 15.8 % — ABNORMAL HIGH (ref 11.5–15.5)
WBC: 14.9 10*3/uL — ABNORMAL HIGH (ref 4.0–10.5)
nRBC: 0 % (ref 0.0–0.2)

## 2023-07-17 LAB — BRAIN NATRIURETIC PEPTIDE: B Natriuretic Peptide: 18 pg/mL (ref 0.0–100.0)

## 2023-07-17 LAB — SARS CORONAVIRUS 2 BY RT PCR: SARS Coronavirus 2 by RT PCR: NEGATIVE

## 2023-07-17 MED ORDER — PREDNISONE 20 MG PO TABS: 40.0000 mg | ORAL_TABLET | Freq: Every day | ORAL | Status: DC

## 2023-07-17 MED ORDER — ALBUTEROL SULFATE HFA 108 (90 BASE) MCG/ACT IN AERS: 2.0000 | INHALATION_SPRAY | Freq: Four times a day (QID) | RESPIRATORY_TRACT | Status: DC | PRN

## 2023-07-17 MED ORDER — IOHEXOL 350 MG/ML SOLN
75.0000 mL | Freq: Once | INTRAVENOUS | Status: AC | PRN
  Administered 2023-07-17: 75 mL via INTRAVENOUS

## 2023-07-17 MED ORDER — KETOROLAC TROMETHAMINE 15 MG/ML IJ SOLN
15.0000 mg | Freq: Four times a day (QID) | INTRAMUSCULAR | Status: DC | PRN
  Administered 2023-07-17 – 2023-07-18 (×3): 15 mg via INTRAVENOUS
  Filled 2023-07-17 (×3): qty 1

## 2023-07-17 MED ORDER — POTASSIUM CHLORIDE CRYS ER 20 MEQ PO TBCR
40.0000 meq | EXTENDED_RELEASE_TABLET | Freq: Once | ORAL | Status: AC
  Administered 2023-07-17: 40 meq via ORAL
  Filled 2023-07-17: qty 2

## 2023-07-17 MED ORDER — ALBUTEROL SULFATE (2.5 MG/3ML) 0.083% IN NEBU: 2.5000 mg | INHALATION_SOLUTION | Freq: Four times a day (QID) | RESPIRATORY_TRACT | Status: DC

## 2023-07-17 MED ORDER — MORPHINE SULFATE (PF) 4 MG/ML IV SOLN
4.0000 mg | Freq: Once | INTRAVENOUS | Status: AC
  Administered 2023-07-17: 4 mg via INTRAVENOUS
  Filled 2023-07-17: qty 1

## 2023-07-17 MED ORDER — POLYETHYLENE GLYCOL 3350 17 G PO PACK: 17.0000 g | PACK | Freq: Every day | ORAL | Status: DC | PRN

## 2023-07-17 MED ORDER — CITALOPRAM HYDROBROMIDE 20 MG PO TABS
20.0000 mg | ORAL_TABLET | Freq: Every day | ORAL | Status: DC
  Administered 2023-07-18 – 2023-07-19 (×2): 20 mg via ORAL
  Filled 2023-07-17 (×2): qty 1

## 2023-07-17 MED ORDER — IPRATROPIUM-ALBUTEROL 0.5-2.5 (3) MG/3ML IN SOLN
RESPIRATORY_TRACT | Status: AC
  Filled 2023-07-17: qty 3

## 2023-07-17 MED ORDER — METHYLPREDNISOLONE SODIUM SUCC 125 MG IJ SOLR
60.0000 mg | Freq: Two times a day (BID) | INTRAMUSCULAR | Status: AC
  Administered 2023-07-17 – 2023-07-18 (×2): 60 mg via INTRAVENOUS
  Filled 2023-07-17 (×2): qty 2

## 2023-07-17 MED ORDER — IPRATROPIUM BROMIDE 0.02 % IN SOLN
0.5000 mg | Freq: Once | RESPIRATORY_TRACT | Status: AC
  Administered 2023-07-17: 0.5 mg via RESPIRATORY_TRACT
  Filled 2023-07-17: qty 2.5

## 2023-07-17 MED ORDER — ONDANSETRON HCL 4 MG PO TABS: 4.0000 mg | ORAL_TABLET | Freq: Four times a day (QID) | ORAL | Status: DC | PRN

## 2023-07-17 MED ORDER — ALBUTEROL SULFATE (2.5 MG/3ML) 0.083% IN NEBU
INHALATION_SOLUTION | RESPIRATORY_TRACT | Status: AC
  Filled 2023-07-17: qty 12

## 2023-07-17 MED ORDER — VANCOMYCIN HCL 1250 MG/250ML IV SOLN: 1250.0000 mg | INTRAVENOUS | Status: DC

## 2023-07-17 MED ORDER — FLUTICASONE FUROATE-VILANTEROL 100-25 MCG/ACT IN AEPB
1.0000 | INHALATION_SPRAY | Freq: Every day | RESPIRATORY_TRACT | Status: DC
  Administered 2023-07-18 – 2023-07-19 (×2): 1 via RESPIRATORY_TRACT
  Filled 2023-07-17: qty 28

## 2023-07-17 MED ORDER — ZOLPIDEM TARTRATE 5 MG PO TABS
5.0000 mg | ORAL_TABLET | Freq: Every evening | ORAL | Status: DC | PRN
  Administered 2023-07-17 – 2023-07-18 (×2): 5 mg via ORAL
  Filled 2023-07-17 (×2): qty 1

## 2023-07-17 MED ORDER — METHYLPREDNISOLONE SODIUM SUCC 125 MG IJ SOLR
125.0000 mg | Freq: Once | INTRAMUSCULAR | Status: AC
  Administered 2023-07-17: 125 mg via INTRAVENOUS
  Filled 2023-07-17: qty 2

## 2023-07-17 MED ORDER — ENOXAPARIN SODIUM 40 MG/0.4ML IJ SOSY
40.0000 mg | PREFILLED_SYRINGE | INTRAMUSCULAR | Status: DC
  Administered 2023-07-17 – 2023-07-18 (×2): 40 mg via SUBCUTANEOUS
  Filled 2023-07-17 (×2): qty 0.4

## 2023-07-17 MED ORDER — HYDROXYZINE HCL 25 MG PO TABS
50.0000 mg | ORAL_TABLET | Freq: Three times a day (TID) | ORAL | Status: DC | PRN
  Administered 2023-07-17 – 2023-07-19 (×3): 50 mg via ORAL
  Filled 2023-07-17 (×3): qty 2

## 2023-07-17 MED ORDER — ALBUTEROL (5 MG/ML) CONTINUOUS INHALATION SOLN
10.0000 mg/h | INHALATION_SOLUTION | Freq: Once | RESPIRATORY_TRACT | Status: AC
  Administered 2023-07-17: 10 mg/h via RESPIRATORY_TRACT
  Filled 2023-07-17: qty 20

## 2023-07-17 MED ORDER — SODIUM CHLORIDE 0.9 % IV SOLN
1.0000 g | Freq: Once | INTRAVENOUS | Status: AC
  Administered 2023-07-17: 1 g via INTRAVENOUS
  Filled 2023-07-17 (×2): qty 10

## 2023-07-17 MED ORDER — VANCOMYCIN HCL 1500 MG/300ML IV SOLN
1500.0000 mg | Freq: Once | INTRAVENOUS | Status: AC
  Administered 2023-07-17: 1500 mg via INTRAVENOUS
  Filled 2023-07-17: qty 300

## 2023-07-17 MED ORDER — ALBUTEROL SULFATE (2.5 MG/3ML) 0.083% IN NEBU: 2.5000 mg | INHALATION_SOLUTION | RESPIRATORY_TRACT | Status: DC | PRN

## 2023-07-17 MED ORDER — IPRATROPIUM-ALBUTEROL 0.5-2.5 (3) MG/3ML IN SOLN
3.0000 mL | Freq: Four times a day (QID) | RESPIRATORY_TRACT | Status: DC
  Administered 2023-07-17 – 2023-07-19 (×6): 3 mL via RESPIRATORY_TRACT
  Filled 2023-07-17 (×5): qty 3

## 2023-07-17 MED ORDER — ALBUTEROL SULFATE HFA 108 (90 BASE) MCG/ACT IN AERS
2.0000 | INHALATION_SPRAY | RESPIRATORY_TRACT | Status: DC | PRN
  Filled 2023-07-17: qty 6.7

## 2023-07-17 MED ORDER — ONDANSETRON HCL 4 MG/2ML IJ SOLN: 4.0000 mg | Freq: Four times a day (QID) | INTRAMUSCULAR | Status: DC | PRN

## 2023-07-17 MED ORDER — HYDROMORPHONE HCL 1 MG/ML IJ SOLN
1.0000 mg | Freq: Once | INTRAMUSCULAR | Status: AC
  Administered 2023-07-17: 1 mg via INTRAVENOUS
  Filled 2023-07-17: qty 1

## 2023-07-17 NOTE — H&P (Signed)
History and Physical    Patient: Casey [name] ZOX:[id number] DOB: 1961/10/17 DOA: 07/17/2023 DOS: the patient was seen and examined on 07/17/2023 PCP: Patient, No Pcp Per  Patient coming from: Home  Chief Complaint:  Chief Complaint  Patient presents with   Shortness of Breath   HPI: Casey [name] is a 62 y.o. female with medical history significant of polysubstance abuse, recent admission for aspiration pneumonia and overdose.  The patient was recently discharged on 9/2 after being admitted on 8/17.  She decompensated quickly and needed to be intubated and was transferred to [facility].  She gradually improved and was ultimately discharged on steroids and antibiotics on 9/2.  Since being home, she has gradually declined.  She reports getting increasingly short of breath since being home until she was unable to ambulate due to the shortness of breath.  She has continued to have coughing and wheezing.  She has not smoked and reports not using fentanyl since her hospitalization, which is her drug of choice.  She has not eaten much today because of the coughing and shortness of breath. Denies fevers, chills, nausea, vomiting.  She was discharged to home on room air and now has an oxygen requirement.  Review of Systems: As mentioned in the history of present illness. All other systems reviewed and are negative. Past Medical History:  Diagnosis Date   Anxiety    Chronic back pain    Depression    History of panic attacks 09/11/2014   Medical history non-contributory    Pneumonia    Polysubstance abuse (HCC)    History of taking non-prescribed opiates, BZDs; also +cocaine and THC   Ruptured disc, thoracic    Suicide attempt (HCC) 2002   Past Surgical History:  Procedure Laterality Date   CESAREAN SECTION     CLAVICLE SURGERY     REVERSE SHOULDER ARTHROPLASTY Left 01/01/2020   Procedure: LEFT REVERSE SHOULDER ARTHROPLASTY;  Surgeon: Cammy Copa, MD;  Location: [facility] OR;   Service: Orthopedics;  Laterality: Left;   TUBAL LIGATION     Social History:  reports that she has been smoking cigarettes. She has never used smokeless tobacco. She reports current alcohol use of about 1.0 standard drink of alcohol per week. She reports that she does not currently use drugs after having used the following drugs: "Crack" cocaine and Benzodiazepines.  No Known Allergies  Family History  Problem Relation Age of Onset   Diabetes Mother    Pulmonary fibrosis Mother    Heart attack Father    Parkinson's disease Sister    Bipolar disorder Sister    Thyroid disease Sister    Heart attack Brother    Other Son        vascular neucrosis   Pulmonary fibrosis Maternal Grandmother    Diabetes Maternal Grandfather    Stroke Maternal Grandfather    Pneumonia Paternal Grandfather    Cancer Sister        melonoma   Other Brother        back problems, nerve problems, soft bones    Prior to Admission medications   Medication Sig Start Date End Date Taking? Authorizing Provider  albuterol (VENTOLIN HFA) 108 (90 Base) MCG/ACT inhaler Inhale 2 puffs into the lungs every 6 (six) hours as needed for wheezing or shortness of breath. 07/12/23  Yes Ghimire, Werner Lean, MD  benzonatate (TESSALON PERLES) 100 MG capsule Take 1 capsule (100 mg total) by mouth 3 (three) times daily as needed  for cough. 07/12/23 07/11/24 Yes Ghimire, Werner Lean, MD  cholecalciferol (VITAMIN D3) 25 MCG (1000 UNIT) tablet Take 1,000 Units by mouth daily.   Yes [provider]  citalopram (CELEXA) 20 MG tablet TAKE 1 TABLET BY MOUTH EVERY DAY 03/01/23  Yes Rakes, Doralee Albino, FNP  guaifenesin (ROBITUSSIN) 100 MG/5ML syrup Take 200 mg by mouth 3 (three) times daily as needed for cough.   Yes [provider]  hydrOXYzine (ATARAX) 50 MG tablet Take 1 tablet (50 mg total) by mouth 3 (three) times daily as needed. 07/12/23  Yes Ghimire, Werner Lean, MD  ibuprofen (ADVIL) 200 MG tablet Take 200 mg by mouth every 6 (six)  hours as needed for fever, headache or mild pain.   Yes [provider]  melatonin 10 MG TABS Take 10 mg by mouth at bedtime. Patient not taking: Reported on 07/17/2023 07/12/23   Maretta Bees, MD    Physical Exam: Vitals:   07/17/23 1345 07/17/23 1456 07/17/23 1520 07/17/23 1811  BP: (!) 140/79   (!) 143/66  Pulse: 89   97  Resp: (!) 24   20  Temp:   98.2 F (36.8 C) 98.4 F (36.9 C)  TempSrc:   Oral Oral  SpO2: 99% 92%  97%  Weight:      Height:       General: Elderly female. Awake and alert and oriented x3. No acute cardiopulmonary distress.  HEENT: Normocephalic atraumatic.  Right and left ears normal in appearance.  Pupils equal, round, reactive to light. Extraocular muscles are intact. Sclerae anicteric and noninjected.  Moist mucosal membranes. No mucosal lesions.  Neck: Neck supple without lymphadenopathy. No carotid bruits. No masses palpated.  Cardiovascular: Regular rate with normal S1-S2 sounds. No murmurs, rubs, gallops auscultated. No JVD.  Respiratory: Expiratory wheezing throughout.  Rhonchorous breathing.  Wheezy cough on auscultation.  No accessory muscle use. Abdomen: Soft, nontender, nondistended. Active bowel sounds. No masses or hepatosplenomegaly  Skin: No rashes, lesions, or ulcerations.  Dry, warm to touch. 2+ dorsalis pedis and radial pulses. Musculoskeletal: No calf or leg pain. All major joints not erythematous nontender.  No upper or lower joint deformation.  Good ROM.  No contractures  Psychiatric: Intact judgment and insight. Pleasant and cooperative. Neurologic: No focal neurological deficits. Strength is 5/5 and symmetric in upper and lower extremities.  Cranial nerves II through XII are grossly intact.  Data Reviewed: Results for orders placed or performed during the hospital encounter of 07/17/23 (from the past 24 hour(s))  Basic metabolic panel     Status: Abnormal   Collection Time: 07/17/23 12:54 PM  Result Value Ref Range   Sodium  137 135 - 145 mmol/L   Potassium 3.0 (L) 3.5 - 5.1 mmol/L   Chloride 107 98 - 111 mmol/L   CO2 21 (L) 22 - 32 mmol/L   Glucose, Bld 106 (H) 70 - 99 mg/dL   BUN 8 8 - 23 mg/dL   Creatinine, Ser 8.29 0.44 - 1.00 mg/dL   Calcium 8.4 (L) 8.9 - 10.3 mg/dL   GFR, Estimated >56 >21 mL/min   Anion gap 9 5 - 15  CBC with Differential/Platelet     Status: Abnormal   Collection Time: 07/17/23 12:54 PM  Result Value Ref Range   WBC 14.9 (H) 4.0 - 10.5 K/uL   RBC 4.36 3.87 - 5.11 MIL/uL   Hemoglobin 12.1 12.0 - 15.0 g/dL   HCT 30.8 65.7 - 84.6 %   MCV 85.6 80.0 -  100.0 fL   MCH 27.8 26.0 - 34.0 pg   MCHC 32.4 30.0 - 36.0 g/dL   RDW 16.1 (H) 09.6 - 04.5 %   Platelets 900 (H) 150 - 400 K/uL   nRBC 0.0 0.0 - 0.2 %   Neutrophils Relative % 69 %   Neutro Abs 10.3 (H) 1.7 - 7.7 K/uL   Lymphocytes Relative 21 %   Lymphs Abs 3.1 0.7 - 4.0 K/uL   Monocytes Relative 8 %   Monocytes Absolute 1.1 (H) 0.1 - 1.0 K/uL   Eosinophils Relative 2 %   Eosinophils Absolute 0.3 0.0 - 0.5 K/uL   Basophils Relative 0 %   Basophils Absolute 0.1 0.0 - 0.1 K/uL   WBC Morphology MORPHOLOGY UNREMARKABLE    RBC Morphology MORPHOLOGY UNREMARKABLE    Smear Review PLATELET COUNT CONFIRMED BY SMEAR    Immature Granulocytes 0 %   Abs Immature Granulocytes 0.06 0.00 - 0.07 K/uL  SARS Coronavirus 2 by RT PCR (hospital order, performed in [facility] hospital lab) *cepheid single result test* Anterior Nasal Swab     Status: None   Collection Time: 07/17/23  2:00 PM   Specimen: Anterior Nasal Swab  Result Value Ref Range   SARS Coronavirus 2 by RT PCR NEGATIVE NEGATIVE  Procalcitonin     Status: None   Collection Time: 07/17/23  5:03 PM  Result Value Ref Range   Procalcitonin 0.25 ng/mL  Brain natriuretic peptide     Status: None   Collection Time: 07/17/23  5:07 PM  Result Value Ref Range   B Natriuretic Peptide 18.0 0.0 - 100.0 pg/mL    CT Angio Chest PE W and/or Wo Contrast  Result Date: 07/17/2023 CLINICAL  DATA:  Shortness of breath EXAM: CT ANGIOGRAPHY CHEST WITH CONTRAST TECHNIQUE: Multidetector CT imaging of the chest was performed using the standard protocol during bolus administration of intravenous contrast. Multiplanar CT image reconstructions and MIPs were obtained to evaluate the vascular anatomy. RADIATION DOSE REDUCTION: This exam was performed according to the departmental dose-optimization program which includes automated exposure control, adjustment of the mA and/or kV according to patient size and/or use of iterative reconstruction technique. CONTRAST:  75mL OMNIPAQUE IOHEXOL 350 MG/ML SOLN COMPARISON:  None Available. FINDINGS: Cardiovascular: No evidence of pulmonary embolus. Evaluation of the segmental subsegmental pulmonary arteries is somewhat limited due to respiratory motion artifact. Normal heart size. No pericardial effusion. Normal caliber thoracic aorta with mild atherosclerotic disease. Moderate coronary artery calcifications. Mediastinum/Nodes: Esophagus thyroid are unremarkable. No enlarged lymph nodes seen in the chest. Lungs/Pleura: Central airways are patent. Bilateral patchy ground-glass opacities with more focal areas of consolidation and areas of interstitial thickening. No pleural effusion or pneumothorax. Upper Abdomen: Indeterminate nodule of the right adrenal gland measuring 1.6 cm. No acute abnormality. Musculoskeletal: Left total shoulder arthroplasty. Moderate compression deformity of T12, likely chronic. Sclerosis of the inferior sternum adjacent to the costochondral joints and sclerosis of the bilateral first sternocostal joints, likely degenerative. Review of the MIP images confirms the above findings. IMPRESSION: 1. No evidence of pulmonary embolus. Evaluation of the segmental and subsegmental pulmonary arteries is somewhat limited due to respiratory motion artifact. 2. Bilateral patchy ground-glass opacities with more focal areas of consolidation and areas of interstitial  thickening, likely resolving sequela of prior multifocal pneumonia. Recommend follow-up chest CT in 3 months to ensure continued resolution. 3. Indeterminate nodule of the right adrenal gland measuring 1.6 cm. Recommend further evaluation with nonemergent adrenal protocol CT. 4. Coronary artery calcifications and  aortic Atherosclerosis (ICD10-I70.0). Electronically Signed   By: Allegra Lai M.D.   On: 07/17/2023 16:34   DG Chest Port 1 View  Result Date: 07/17/2023 CLINICAL DATA:  Shortness of breath.  Recent diagnosis of pneumonia. EXAM: PORTABLE CHEST 1 VIEW COMPARISON:  July 08, 2023 FINDINGS: Cardiomediastinal silhouette is normal. Mediastinal contours appear intact. Compared to the prior radiograph, there is partial resolution of the patchy airspace opacities throughout both lungs, right greater than left. Significant residual remains. Osseous structures are without acute abnormality. Soft tissues are grossly normal. IMPRESSION: Partial resolution of the patchy airspace opacities throughout both lungs, right greater than left. Significant residual remains. Electronically Signed   By: Ted Mcalpine M.D.   On: 07/17/2023 13:30     Assessment and Plan: No notes have been filed under this hospital service. Service: Hospitalist  Principal Problem:   Acute respiratory failure with hypoxia (HCC) Active Problems:   Polysubstance abuse (HCC)   COPD with acute exacerbation (HCC)   Opioid use disorder  Acute respiratory failure with hypoxia Secondary to COPD CTA negative for PE. Procalcitonin 0.25, which indicates that this is likely secondary to COPD/viral symptoms BNP normal. Given that there is not an elevated BNP and there is no PE on CTA, less likely to have valvular problems secondary to an endocarditis.  Will hold off on echo at this moment unless she does not appear to improve with steroids. COPD with acute exacerbation Antibiotics: Cefepime and vancomycin Nasal swab for  MRSA DuoNeb's every 6 scheduled with albuterol every 2 when necessary Start inhaled steroids and LA bronchodilator Solu-Medrol 60 mg IV every 12 hours Mucinex Opioid use disorder with polysubstance abuse. Patient states that she has not used opioids since her admission.  Will watch for signs of withdrawal   Advance Care Planning:   Code Status: Full Code confirmed by patient  Consults: None  Family Communication: None  Severity of Illness: The appropriate patient status for this patient is INPATIENT. Inpatient status is judged to be reasonable and necessary in order to provide the required intensity of service to ensure the patient's safety. The patient's presenting symptoms, physical exam findings, and initial radiographic and laboratory data in the context of their chronic comorbidities is felt to place them at high risk for further clinical deterioration. Furthermore, it is not anticipated that the patient will be medically stable for discharge from the hospital within 2 midnights of admission.   * I certify that at the point of admission it is my clinical judgment that the patient will require inpatient hospital care spanning beyond 2 midnights from the point of admission due to high intensity of service, high risk for further deterioration and high frequency of surveillance required.*  Author: Levie Heritage, DO 07/17/2023 6:14 PM  For on call review [email or web address].

## 2023-07-17 NOTE — ED Provider Notes (Signed)
D/w Dr. Adrian Blackwater - will admit   Eber Hong, MD 07/17/23 1700

## 2023-07-17 NOTE — ED Triage Notes (Signed)
Pt comes from home with SOB that is getting worse since being discharged from the hospital on Monday. Pt states she was diagnosed with double pneumonia and prescribed amoxicillin and has developed diarrhea. Pt denies n/v. Pt states no abdominal pain just CP and back pain.

## 2023-07-17 NOTE — Progress Notes (Signed)
Pt made comfortable, oriented to room and call bell. Instructed patient to call for assist to bathroom. Abdomen non-tender on palpation and not distended. MD made aware. Encouraged fluids. Pt also complains of chest pain radiating to R shoulder around to her back. Rates 9/10. MD made aware. Wheezing heard in lungs neb treatments ordered by MD. After ambulating to BR patient states she feels very hot diaphoretic HR 107 BP: 170/85 MAP 109 O2 97% 2L T 98.5 orally. Cool rags applied to forehead room temperature adjusted.

## 2023-07-17 NOTE — ED Provider Notes (Signed)
[place] EMERGENCY DEPARTMENT AT [facility] Provider Note   CSN: [id number] Arrival date & time: 07/17/23  1054     History {Add pertinent medical, surgical, social history, OB history to HPI:1} Chief Complaint  Patient presents with   Shortness of Breath    Casey [name] is a 62 y.o. female.  HPI     62 y.o. female with history of anxiety/depression, chronic pain syndrome, polysubstance abuse (cocaine/methamphetamine/marijuana) per records who comes in with chief complaint of worsening shortness of breath, right-sided thoracic pain.  Patient states that about a day or 2 after discharge she started noticing shortness of breath, that has worsened.  Per nursing staff, patient was saturating in the 80s when she arrived.  Patient indicates that she has had persistent cough, it is producing clear phlegm.  She does not think she is having any fevers.  She is having thoracic pain when she coughs.  She is normally not on any oxygen.  While in the hospital, she required intubation and in total required about 14 days of admission. Pt has no hx of PE, DVT and denies any exogenous hormone (testosterone / estrogen) use, long distance travels or surgery in the past 6 weeks, active cancer, recent immobilization.   Home Medications Prior to Admission medications   Medication Sig Start Date End Date Taking? Authorizing Provider  albuterol (VENTOLIN HFA) 108 (90 Base) MCG/ACT inhaler Inhale 2 puffs into the lungs every 6 (six) hours as needed for wheezing or shortness of breath. 07/12/23  Yes Ghimire, Werner Lean, MD  benzonatate (TESSALON PERLES) 100 MG capsule Take 1 capsule (100 mg total) by mouth 3 (three) times daily as needed for cough. 07/12/23 07/11/24 Yes Ghimire, Werner Lean, MD  cholecalciferol (VITAMIN D3) 25 MCG (1000 UNIT) tablet Take 1,000 Units by mouth daily.   Yes [provider]  citalopram (CELEXA) 20 MG tablet TAKE 1 TABLET BY MOUTH EVERY DAY 03/01/23  Yes Rakes, Doralee Albino, FNP  guaifenesin (ROBITUSSIN) 100 MG/5ML syrup Take 200 mg by mouth 3 (three) times daily as needed for cough.   Yes [provider]  hydrOXYzine (ATARAX) 50 MG tablet Take 1 tablet (50 mg total) by mouth 3 (three) times daily as needed. 07/12/23  Yes Ghimire, Werner Lean, MD  ibuprofen (ADVIL) 200 MG tablet Take 200 mg by mouth every 6 (six) hours as needed for fever, headache or mild pain.   Yes [provider]  melatonin 10 MG TABS Take 10 mg by mouth at bedtime. Patient not taking: Reported on 07/17/2023 07/12/23   Maretta Bees, MD      Allergies    Patient has no known allergies.    Review of Systems   Review of Systems  All other systems reviewed and are negative.   Physical Exam Updated Vital Signs BP (!) 140/79   Pulse 89   Temp 98.2 F (36.8 C) (Oral)   Resp (!) 24   Ht 5\' 5"  (1.651 m)   Wt 65.5 kg   LMP  (LMP Unknown)   SpO2 92%   BMI 24.03 kg/m  Physical Exam Vitals and nursing note reviewed.  Constitutional:      Appearance: She is well-developed.  HENT:     Head: Atraumatic.  Cardiovascular:     Rate and Rhythm: Normal rate.  Pulmonary:     Effort: Pulmonary effort is normal.     Breath sounds: Wheezing and rhonchi present. No rales.  Musculoskeletal:     Cervical  back: Normal range of motion and neck supple.     Right lower leg: No edema.     Left lower leg: No edema.  Skin:    General: Skin is warm and dry.  Neurological:     Mental Status: She is alert and oriented to person, place, and time.     ED Results / Procedures / Treatments   Labs (all labs ordered are listed, but only abnormal results are displayed) Labs Reviewed  BASIC METABOLIC PANEL - Abnormal; Notable for the following components:      Result Value   Potassium 3.0 (*)    CO2 21 (*)    Glucose, Bld 106 (*)    Calcium 8.4 (*)    All other components within normal limits  CBC WITH DIFFERENTIAL/PLATELET - Abnormal; Notable for the following components:   WBC  14.9 (*)    RDW 15.8 (*)    Platelets 900 (*)    Neutro Abs 10.3 (*)    Monocytes Absolute 1.1 (*)    All other components within normal limits  SARS CORONAVIRUS 2 BY RT PCR    EKG EKG Interpretation Date/Time:  Saturday July 17 2023 11:54:16 EDT Ventricular Rate:  95 PR Interval:  153 QRS Duration:  94 QT Interval:  399 QTC Calculation: 505 R Axis:   17  Text Interpretation: Sinus rhythm Abnrm T, probable ischemia, anterolateral lds Prolonged QT interval No acute changes No significant change since last tracing Confirmed by Derwood Kaplan ([id number]) on 07/17/2023 12:32:28 PM  Radiology DG Chest Port 1 View  Result Date: 07/17/2023 CLINICAL DATA:  Shortness of breath.  Recent diagnosis of pneumonia. EXAM: PORTABLE CHEST 1 VIEW COMPARISON:  July 08, 2023 FINDINGS: Cardiomediastinal silhouette is normal. Mediastinal contours appear intact. Compared to the prior radiograph, there is partial resolution of the patchy airspace opacities throughout both lungs, right greater than left. Significant residual remains. Osseous structures are without acute abnormality. Soft tissues are grossly normal. IMPRESSION: Partial resolution of the patchy airspace opacities throughout both lungs, right greater than left. Significant residual remains. Electronically Signed   By: Ted Mcalpine M.D.   On: 07/17/2023 13:30    Procedures Procedures  {Document cardiac monitor, telemetry assessment procedure when appropriate:1}  Medications Ordered in ED Medications  albuterol (VENTOLIN HFA) 108 (90 Base) MCG/ACT inhaler 2 puff (has no administration in time range)  albuterol (PROVENTIL) (2.5 MG/3ML) 0.083% nebulizer solution (  Not Given 07/17/23 1330)  HYDROmorphone (DILAUDID) injection 1 mg (has no administration in time range)  methylPREDNISolone sodium succinate (SOLU-MEDROL) 125 mg/2 mL injection 125 mg (125 mg Intravenous Given 07/17/23 1311)  albuterol (PROVENTIL,VENTOLIN) solution continuous neb  (10 mg/hr Nebulization Given 07/17/23 1327)  ipratropium (ATROVENT) nebulizer solution 0.5 mg (0.5 mg Nebulization Given 07/17/23 1327)  morphine (PF) 4 MG/ML injection 4 mg (4 mg Intravenous Given 07/17/23 1312)  potassium chloride SA (KLOR-CON M) CR tablet 40 mEq (40 mEq Oral Given 07/17/23 1357)  iohexol (OMNIPAQUE) 350 MG/ML injection 75 mL (75 mLs Intravenous Contrast Given 07/17/23 1521)    ED Course/ Medical Decision Making/ A&P   {   Click here for ABCD2, HEART and other calculatorsREFRESH Note before signing :1}                              Medical Decision Making Amount and/or Complexity of Data Reviewed Labs: ordered. Radiology: ordered.  Risk Prescription drug management.  This patient presents to the ED  with chief complaint(s) of shortness of breath, right-sided scapular pain with pertinent past medical history of COPD, recent admission for multifocal pneumonia, not oxygen dependent.  Last admission she required intubation..The complaint involves an extensive differential diagnosis and also carries with it a high risk of complications and morbidity.    The differential diagnosis includes : COPD exacerbation, multifocal pneumonia, hospital-acquired pneumonia, pulmonary embolism, septic emboli, CHF.  The initial plan is to get basic labs and COVID-19 test.  We will proceed with CT angio chest if her initial workup in the x-ray is reassuring.   Additional history obtained: Records reviewed previous admission documents  Independent labs interpretation:  The following labs were independently interpreted: Patient has an elevated white count of 14.9.  COVID-19 test is negative.  Her platelet count is 900, likely acute marker for stress.  Independent visualization and interpretation of imaging: - I independently visualized the following imaging with scope of interpretation limited to determining acute life threatening conditions related to emergency care: X-ray of the chest, which  revealed diffuse haziness, but the x-ray looks better/improved than the one at the time of admission.  We will proceed with CT angio chest PE.  Patient reassessed, results discussed.  She had received morphine.  She is still complaining of pain.  Will give her 1 round of hydromorphone now.  Treatment and Reassessment: Patient will need admission to the hospital as she is hypoxic and tachypneic.  At this time she does not need BiPAP, but if she continues to have increased work of breathing then she might need BiPAP for work of breathing purposes. She will need admission because of hypoxia.    Final Clinical Impression(s) / ED Diagnoses Final diagnoses:  Acute hypoxemic respiratory failure (HCC)    Rx / DC Orders ED Discharge Orders     None

## 2023-07-17 NOTE — Progress Notes (Signed)
Pharmacy Antibiotic Note  Casey [name] is a 62 y.o. female admitted on 07/17/2023 with pneumonia.  Pharmacy has been consulted for vancomycin dosing. Recent admission 8/16 - 9/2 for multifocal pneumonia, required intubation. Was on Unasyn 8/29 >> 9/2, and discharged on Augmentin x 2 more days   Plan: Vancomycin 1500 mg IV x 1 , followed by  vancomycin 1250 mg IV every 24 hours (using SCr 0.8, Vd 0.72, eAUC 461) Monitor clinical progress, cultures/sensitivities, renal function, abx plan Vancomycin levels as indicated   Height: 5\' 5"  (165.1 cm) Weight: 65.5 kg (144 lb 6.4 oz) IBW/kg (Calculated) : 57  Temp (24hrs), Avg:98.2 F (36.8 C), Min:98.2 F (36.8 C), Max:98.2 F (36.8 C)  Recent Labs  Lab 07/11/23 0632 07/12/23 0319 07/17/23 1254  WBC 14.7* 14.1* 14.9*  CREATININE 0.70  --  0.44    Estimated Creatinine Clearance: 63.9 mL/min (by C-G formula based on SCr of 0.44 mg/dL).    No Known Allergies  Antimicrobials this admission: 9/7 vancomycin >>  9/7 cefepime    Dose adjustments this admission:   Microbiology results:    Thank you for allowing pharmacy to be a part of this patient's care.   Signe Colt, PharmD 07/17/2023 5:16 PM  **Pharmacist phone directory can be found on [email or web address] listed under [facility]**

## 2023-07-17 NOTE — ED Notes (Signed)
ED TO INPATIENT HANDOFF REPORT  ED Nurse Name and Phone #: Haze Justin [phone]  S Name/Age/Gender Casey [name] 62 y.o. female Room/Bed: APA02/APA02  Code Status   Code Status: Prior  Home/SNF/Other Home Patient oriented to: self, place, time, and situation Is this baseline? Yes   Triage Complete: Triage complete  Chief Complaint Acute respiratory failure with hypoxia [facility]) [J96.01]  Triage Note Pt comes from home with SOB that is getting worse since being discharged from the hospital on Monday. Pt states she was diagnosed with double pneumonia and prescribed amoxicillin and has developed diarrhea. Pt denies n/v. Pt states no abdominal pain just CP and back pain.    Allergies No Known Allergies  Level of Care/Admitting Diagnosis ED Disposition     ED Disposition  Admit   Condition  --   Comment  Hospital Area: [facility] [[id number]]  Level of Care: Med-Surg [16]  Covid Evaluation: Asymptomatic - no recent exposure (last 10 days) testing not required  Diagnosis: Acute respiratory failure with hypoxia [facility]) [[id number]]  Admitting Physician: Levie Heritage [[id number]]  Attending Physician: Levie Heritage [[id number]]  Certification:: I certify this patient will need inpatient services for at least 2 midnights  Expected Medical Readiness: 07/20/2023          B Medical/Surgery History Past Medical History:  Diagnosis Date   Anxiety    Chronic back pain    Depression    History of panic attacks 09/11/2014   Medical history non-contributory    Pneumonia    Polysubstance abuse (HCC)    History of taking non-prescribed opiates, BZDs; also +cocaine and THC   Ruptured disc, thoracic    Suicide attempt (HCC) 2002   Past Surgical History:  Procedure Laterality Date   CESAREAN SECTION     CLAVICLE SURGERY     REVERSE SHOULDER ARTHROPLASTY Left 01/01/2020   Procedure: LEFT REVERSE SHOULDER ARTHROPLASTY;  Surgeon: Cammy Copa, MD;  Location: [facility] OR;  Service:  Orthopedics;  Laterality: Left;   TUBAL LIGATION       A IV Location/Drains/Wounds Patient Lines/Drains/Airways Status     Active Line/Drains/Airways     Name Placement date Placement time Site Days   Peripheral IV 07/17/23 20 G 1" Anterior;Left Forearm 07/17/23  1150  Forearm  less than 1            Intake/Output Last 24 hours No intake or output data in the 24 hours ending 07/17/23 1749  Labs/Imaging Results for orders placed or performed during the hospital encounter of 07/17/23 (from the past 48 hour(s))  Basic metabolic panel     Status: Abnormal   Collection Time: 07/17/23 12:54 PM  Result Value Ref Range   Sodium 137 135 - 145 mmol/L   Potassium 3.0 (L) 3.5 - 5.1 mmol/L   Chloride 107 98 - 111 mmol/L   CO2 21 (L) 22 - 32 mmol/L   Glucose, Bld 106 (H) 70 - 99 mg/dL    Comment: Glucose reference range applies only to samples taken after fasting for at least 8 hours.   BUN 8 8 - 23 mg/dL   Creatinine, Ser 0.45 0.44 - 1.00 mg/dL   Calcium 8.4 (L) 8.9 - 10.3 mg/dL   GFR, Estimated >40 >98 mL/min    Comment: (NOTE) Calculated using the CKD-EPI Creatinine Equation (2021)    Anion gap 9 5 - 15    Comment: Performed at [facility], [address], [place], [place] [address]  CBC with  Differential/Platelet     Status: Abnormal   Collection Time: 07/17/23 12:54 PM  Result Value Ref Range   WBC 14.9 (H) 4.0 - 10.5 K/uL   RBC 4.36 3.87 - 5.11 MIL/uL   Hemoglobin 12.1 12.0 - 15.0 g/dL   HCT 78.2 95.6 - 21.3 %   MCV 85.6 80.0 - 100.0 fL   MCH 27.8 26.0 - 34.0 pg   MCHC 32.4 30.0 - 36.0 g/dL   RDW 08.6 (H) 57.8 - 46.9 %   Platelets 900 (H) 150 - 400 K/uL    Comment: REPEATED TO VERIFY   nRBC 0.0 0.0 - 0.2 %   Neutrophils Relative % 69 %   Neutro Abs 10.3 (H) 1.7 - 7.7 K/uL   Lymphocytes Relative 21 %   Lymphs Abs 3.1 0.7 - 4.0 K/uL   Monocytes Relative 8 %   Monocytes Absolute 1.1 (H) 0.1 - 1.0 K/uL   Eosinophils Relative 2 %   Eosinophils Absolute 0.3 0.0 - 0.5  K/uL   Basophils Relative 0 %   Basophils Absolute 0.1 0.0 - 0.1 K/uL   WBC Morphology MORPHOLOGY UNREMARKABLE    RBC Morphology MORPHOLOGY UNREMARKABLE    Smear Review PLATELET COUNT CONFIRMED BY SMEAR    Immature Granulocytes 0 %   Abs Immature Granulocytes 0.06 0.00 - 0.07 K/uL    Comment: Performed at [facility], [address], [place], [place] [address]  SARS Coronavirus 2 by RT PCR (hospital order, performed in [facility] hospital lab) *cepheid single result test* Anterior Nasal Swab     Status: None   Collection Time: 07/17/23  2:00 PM   Specimen: Anterior Nasal Swab  Result Value Ref Range   SARS Coronavirus 2 by RT PCR NEGATIVE NEGATIVE    Comment: (NOTE) SARS-CoV-2 target nucleic acids are NOT DETECTED.  The SARS-CoV-2 RNA is generally detectable in upper and lower respiratory specimens during the acute phase of infection. The lowest concentration of SARS-CoV-2 viral copies this assay can detect is 250 copies / mL. A negative result does not preclude SARS-CoV-2 infection and should not be used as the sole basis for treatment or other patient management decisions.  A negative result may occur with improper specimen collection / handling, submission of specimen other than nasopharyngeal swab, presence of viral mutation(s) within the areas targeted by this assay, and inadequate number of viral copies (<250 copies / mL). A negative result must be combined with clinical observations, patient history, and epidemiological information.  Fact Sheet for Patients:   RoadLapTop.co.za  Fact Sheet for Healthcare Providers: http://kim-miller.com/  This test is not yet approved or  cleared by the [place] FDA and has been authorized for detection and/or diagnosis of SARS-CoV-2 by FDA under an Emergency Use Authorization (EUA).  This EUA will remain in effect (meaning this test can be used) for the duration of the COVID-19  declaration under Section 564(b)(1) of the Act, 21 U.S.C. section 360bbb-3(b)(1), unless the authorization is terminated or revoked sooner.  Performed at [facility], [address], [place], [place] [address]   Brain natriuretic peptide     Status: None   Collection Time: 07/17/23  5:07 PM  Result Value Ref Range   B Natriuretic Peptide 18.0 0.0 - 100.0 pg/mL    Comment: Performed at [facility], [address] [place], [place] [address]   CT Angio Chest PE W and/or Wo Contrast  Result Date: 07/17/2023 CLINICAL DATA:  Shortness of breath EXAM: CT ANGIOGRAPHY CHEST WITH CONTRAST TECHNIQUE:  Multidetector CT imaging of the chest was performed using the standard protocol during bolus administration of intravenous contrast. Multiplanar CT image reconstructions and MIPs were obtained to evaluate the vascular anatomy. RADIATION DOSE REDUCTION: This exam was performed according to the departmental dose-optimization program which includes automated exposure control, adjustment of the mA and/or kV according to patient size and/or use of iterative reconstruction technique. CONTRAST:  75mL OMNIPAQUE IOHEXOL 350 MG/ML SOLN COMPARISON:  None Available. FINDINGS: Cardiovascular: No evidence of pulmonary embolus. Evaluation of the segmental subsegmental pulmonary arteries is somewhat limited due to respiratory motion artifact. Normal heart size. No pericardial effusion. Normal caliber thoracic aorta with mild atherosclerotic disease. Moderate coronary artery calcifications. Mediastinum/Nodes: Esophagus thyroid are unremarkable. No enlarged lymph nodes seen in the chest. Lungs/Pleura: Central airways are patent. Bilateral patchy ground-glass opacities with more focal areas of consolidation and areas of interstitial thickening. No pleural effusion or pneumothorax. Upper Abdomen: Indeterminate nodule of the right adrenal gland measuring 1.6 cm. No acute abnormality. Musculoskeletal: Left total shoulder arthroplasty.  Moderate compression deformity of T12, likely chronic. Sclerosis of the inferior sternum adjacent to the costochondral joints and sclerosis of the bilateral first sternocostal joints, likely degenerative. Review of the MIP images confirms the above findings. IMPRESSION: 1. No evidence of pulmonary embolus. Evaluation of the segmental and subsegmental pulmonary arteries is somewhat limited due to respiratory motion artifact. 2. Bilateral patchy ground-glass opacities with more focal areas of consolidation and areas of interstitial thickening, likely resolving sequela of prior multifocal pneumonia. Recommend follow-up chest CT in 3 months to ensure continued resolution. 3. Indeterminate nodule of the right adrenal gland measuring 1.6 cm. Recommend further evaluation with nonemergent adrenal protocol CT. 4. Coronary artery calcifications and aortic Atherosclerosis (ICD10-I70.0). Electronically Signed   By: Allegra Lai M.D.   On: 07/17/2023 16:34   DG Chest Port 1 View  Result Date: 07/17/2023 CLINICAL DATA:  Shortness of breath.  Recent diagnosis of pneumonia. EXAM: PORTABLE CHEST 1 VIEW COMPARISON:  July 08, 2023 FINDINGS: Cardiomediastinal silhouette is normal. Mediastinal contours appear intact. Compared to the prior radiograph, there is partial resolution of the patchy airspace opacities throughout both lungs, right greater than left. Significant residual remains. Osseous structures are without acute abnormality. Soft tissues are grossly normal. IMPRESSION: Partial resolution of the patchy airspace opacities throughout both lungs, right greater than left. Significant residual remains. Electronically Signed   By: Ted Mcalpine M.D.   On: 07/17/2023 13:30    Pending Labs Unresulted Labs (From admission, onward)     Start     Ordered   07/17/23 1702  Procalcitonin  Once,   URGENT       References:    Procalcitonin Lower Respiratory Tract Infection AND Sepsis Procalcitonin Algorithm   07/17/23  1701            Vitals/Pain Today's Vitals   07/17/23 1520 07/17/23 1554 07/17/23 1621 07/17/23 1735  BP:      Pulse:      Resp:      Temp: 98.2 F (36.8 C)     TempSrc: Oral     SpO2:      Weight:      Height:      PainSc:  3  3  6      Isolation Precautions Airborne and Contact precautions  Medications Medications  albuterol (VENTOLIN HFA) 108 (90 Base) MCG/ACT inhaler 2 puff (has no administration in time range)  albuterol (PROVENTIL) (2.5 MG/3ML) 0.083% nebulizer solution (  Not Given 07/17/23 1330)  ceFEPIme (MAXIPIME) 1 g in sodium chloride 0.9 % 100 mL IVPB (has no administration in time range)  vancomycin (VANCOREADY) IVPB 1500 mg/300 mL (1,500 mg Intravenous New Bag/Given 07/17/23 1735)  vancomycin (VANCOREADY) IVPB 1250 mg/250 mL (has no administration in time range)  methylPREDNISolone sodium succinate (SOLU-MEDROL) 125 mg/2 mL injection 125 mg (125 mg Intravenous Given 07/17/23 1311)  albuterol (PROVENTIL,VENTOLIN) solution continuous neb (10 mg/hr Nebulization Given 07/17/23 1327)  ipratropium (ATROVENT) nebulizer solution 0.5 mg (0.5 mg Nebulization Given 07/17/23 1327)  morphine (PF) 4 MG/ML injection 4 mg (4 mg Intravenous Given 07/17/23 1312)  potassium chloride SA (KLOR-CON M) CR tablet 40 mEq (40 mEq Oral Given 07/17/23 1357)  iohexol (OMNIPAQUE) 350 MG/ML injection 75 mL (75 mLs Intravenous Contrast Given 07/17/23 1521)  HYDROmorphone (DILAUDID) injection 1 mg (1 mg Intravenous Given 07/17/23 1550)    Mobility walks        R Recommendations: See Admitting Provider Note  Report given to: [facility]

## 2023-07-18 DIAGNOSIS — J9601 Acute respiratory failure with hypoxia: Secondary | ICD-10-CM

## 2023-07-18 LAB — BASIC METABOLIC PANEL
Anion gap: 13 (ref 5–15)
BUN: 12 mg/dL (ref 8–23)
CO2: 20 mmol/L — ABNORMAL LOW (ref 22–32)
Calcium: 9 mg/dL (ref 8.9–10.3)
Chloride: 105 mmol/L (ref 98–111)
Creatinine, Ser: 0.48 mg/dL (ref 0.44–1.00)
GFR, Estimated: >60 mL/min (ref >60)
Glucose, Bld: 142 mg/dL — ABNORMAL HIGH (ref 70–99)
Potassium: 3.7 mmol/L (ref 3.5–5.1)
Sodium: 138 mmol/L (ref 135–145)

## 2023-07-18 LAB — CBC
HCT: 35.1 % — ABNORMAL LOW (ref 36.0–46.0)
Hemoglobin: 11.3 g/dL — ABNORMAL LOW (ref 12.0–15.0)
MCH: 27.8 pg (ref 26.0–34.0)
MCHC: 32.2 g/dL (ref 30.0–36.0)
MCV: 86.2 fL (ref 80.0–100.0)
Platelets: 851 10*3/uL — ABNORMAL HIGH (ref 150–400)
RBC: 4.07 MIL/uL (ref 3.87–5.11)
RDW: 15.9 % — ABNORMAL HIGH (ref 11.5–15.5)
WBC: 14.2 10*3/uL — ABNORMAL HIGH (ref 4.0–10.5)
nRBC: 0 % (ref 0.0–0.2)

## 2023-07-18 MED ORDER — SODIUM CHLORIDE 0.9 % IV SOLN
100.0000 mg | Freq: Two times a day (BID) | INTRAVENOUS | Status: DC
  Administered 2023-07-18 – 2023-07-19 (×3): 100 mg via INTRAVENOUS
  Filled 2023-07-18 (×5): qty 100

## 2023-07-18 MED ORDER — METHYLPREDNISOLONE SODIUM SUCC 40 MG IJ SOLR
40.0000 mg | Freq: Two times a day (BID) | INTRAMUSCULAR | Status: DC
  Administered 2023-07-18 – 2023-07-19 (×2): 40 mg via INTRAVENOUS
  Filled 2023-07-18 (×2): qty 1

## 2023-07-18 MED ORDER — OXYCODONE-ACETAMINOPHEN 5-325 MG PO TABS
1.0000 | ORAL_TABLET | Freq: Four times a day (QID) | ORAL | Status: DC | PRN
  Administered 2023-07-18 – 2023-07-19 (×5): 1 via ORAL
  Filled 2023-07-18 (×5): qty 1

## 2023-07-18 MED ORDER — HYDRALAZINE HCL 20 MG/ML IJ SOLN: 10.0000 mg | Freq: Four times a day (QID) | INTRAMUSCULAR | Status: DC | PRN

## 2023-07-18 NOTE — Progress Notes (Signed)
PRN Toradol given for severe pain during the night. BP and HR elevated, MD notified. Hydralazine ordered with parameters, not given.

## 2023-07-18 NOTE — Progress Notes (Signed)
   07/18/23 1622  TOC Brief Assessment  Insurance and Status Reviewed  Patient has primary care physician No (PCP list added to AVS.)  Home environment has been reviewed Home with family  Prior level of function: independent  Prior/Current Home Services No current home services  Social Determinants of Health Reivew SDOH reviewed no interventions necessary  Readmission risk has been reviewed Yes  Transition of care needs no transition of care needs at this time   NO PCP, list added to AVS. TOC following.  Transition of Care Department [facility]) has reviewed patient and no TOC needs have been identified at this time. We will continue to monitor patient advancement through interdisciplinary progression rounds. If new patient transition needs arise, please place a TOC consult.

## 2023-07-18 NOTE — Progress Notes (Signed)
PROGRESS NOTE    [name]  WGN:[id number] DOB: 1960-12-29 DOA: 07/17/2023 PCP: Patient, No Pcp Per   Brief Narrative:    Casey [name] is a 62 y.o. female with medical history significant of polysubstance abuse, recent admission for aspiration pneumonia and overdose.  The patient was recently discharged on 9/2 after being admitted on 8/17.  She decompensated quickly and needed to be intubated and was transferred to [facility].  She gradually improved and was ultimately discharged on steroids and antibiotics on 9/2.  Since being home, she has gradually declined.  She was admitted with acute hypoxemic respiratory failure secondary to acute COPD exacerbation.  Assessment & Plan:   Principal Problem:   Acute respiratory failure with hypoxia (HCC) Active Problems:   Polysubstance abuse (HCC)   COPD with acute exacerbation (HCC)   Opioid use disorder  Assessment and Plan:  Acute respiratory failure with hypoxia Secondary to COPD CTA negative for PE. Procalcitonin 0.25, which indicates that this is likely secondary to COPD/viral symptoms BNP normal. Given that there is not an elevated BNP and there is no PE on CTA, less likely to have valvular problems secondary to an endocarditis.  Will hold off on echo at this moment unless she does not appear to improve with steroids. COPD with acute exacerbation Antibiotics: Cefepime and vancomycin, initially given and will switch to doxycycline Nasal swab for MRSA DuoNeb's every 6 scheduled with albuterol every 2 when necessary Start inhaled steroids and LA bronchodilator Solu-Medrol 60 mg IV every 12 hours Mucinex Opioid use disorder with polysubstance abuse. Patient states that she has not used opioids since her admission.  Will watch for signs of withdrawal and give Percocet as needed for now.    DVT prophylaxis:Lovenox Code Status: Full Family Communication: None at bedside Disposition Plan:  Status is: Inpatient Remains  inpatient appropriate because: Need for IV medications.   Consultants:  None  Procedures:  None  Antimicrobials:  Anti-infectives (From admission, onward)    Start     Dose/Rate Route Frequency Ordered Stop   07/18/23 1900  vancomycin (VANCOREADY) IVPB 1250 mg/250 mL  Status:  Discontinued        1,250 mg 166.7 mL/hr over 90 Minutes Intravenous Every 24 hours 07/17/23 1721 07/17/23 1844   07/18/23 1800  vancomycin (VANCOREADY) IVPB 1250 mg/250 mL  Status:  Discontinued        1,250 mg 166.7 mL/hr over 90 Minutes Intravenous Every 24 hours 07/17/23 1844 07/18/23 0659   07/18/23 0800  doxycycline (VIBRAMYCIN) 100 mg in sodium chloride 0.9 % 250 mL IVPB        100 mg 125 mL/hr over 120 Minutes Intravenous Every 12 hours 07/18/23 0659     07/17/23 1700  ceFEPIme (MAXIPIME) 1 g in sodium chloride 0.9 % 100 mL IVPB        1 g 200 mL/hr over 30 Minutes Intravenous  Once 07/17/23 1652 07/17/23 2031   07/17/23 1700  vancomycin (VANCOREADY) IVPB 1500 mg/300 mL        1,500 mg 150 mL/hr over 120 Minutes Intravenous  Once 07/17/23 1654 07/17/23 1935      Subjective: Patient seen and evaluated today with complaints of ongoing pain and states that she would like to have some Percocet.  She continues to have ongoing wheezing and coughing as well as chest tightness.  Objective: Vitals:   07/18/23 0200 07/18/23 0512 07/18/23 0545 07/18/23 0634  BP: (!) 158/84  (!) 167/85 (!) 152/80  Pulse:  100   Resp:      Temp:   98.3 F (36.8 C)   TempSrc:  Oral Oral   SpO2:   94%   Weight:      Height:        Intake/Output Summary (Last 24 hours) at 07/18/2023 0657 Last data filed at 07/17/2023 1800 Gross per 24 hour  Intake 56.65 ml  Output --  Net 56.65 ml   Filed Weights   07/17/23 1148  Weight: 65.5 kg    Examination:  General exam: Appears calm and comfortable  Respiratory system: Bilateral wheezing and increased respiratory effort noted.  Currently on 2 L nasal  cannula. Cardiovascular system: S1 & S2 heard, RRR.  Gastrointestinal system: Abdomen is soft Central nervous system: Alert and awake Extremities: No edema Skin: No significant lesions noted Psychiatry: Flat affect.    Data Reviewed: I have personally reviewed following labs and imaging studies  CBC: Recent Labs  Lab 07/12/23 0319 07/17/23 1254 07/18/23 0405  WBC 14.1* 14.9* 14.2*  NEUTROABS  --  10.3*  --   HGB 10.8* 12.1 11.3*  HCT 33.8* 37.3 35.1*  MCV 86.2 85.6 86.2  PLT 788* 900* 851*   Basic Metabolic Panel: Recent Labs  Lab 07/17/23 1254 07/18/23 0405  NA 137 138  K 3.0* 3.7  CL 107 105  CO2 21* 20*  GLUCOSE 106* 142*  BUN 8 12  CREATININE 0.44 0.48  CALCIUM 8.4* 9.0   GFR: Estimated Creatinine Clearance: 63.9 mL/min (by C-G formula based on SCr of 0.48 mg/dL). Liver Function Tests: No results for input(s): "AST", "ALT", "ALKPHOS", "BILITOT", "PROT", "ALBUMIN" in the last 168 hours. No results for input(s): "LIPASE", "AMYLASE" in the last 168 hours. No results for input(s): "AMMONIA" in the last 168 hours. Coagulation Profile: No results for input(s): "INR", "PROTIME" in the last 168 hours. Cardiac Enzymes: No results for input(s): "CKTOTAL", "CKMB", "CKMBINDEX", "TROPONINI" in the last 168 hours. BNP (last 3 results) No results for input(s): "PROBNP" in the last 8760 hours. HbA1C: No results for input(s): "HGBA1C" in the last 72 hours. CBG: No results for input(s): "GLUCAP" in the last 168 hours. Lipid Profile: No results for input(s): "CHOL", "HDL", "LDLCALC", "TRIG", "CHOLHDL", "LDLDIRECT" in the last 72 hours. Thyroid Function Tests: No results for input(s): "TSH", "T4TOTAL", "FREET4", "T3FREE", "THYROIDAB" in the last 72 hours. Anemia Panel: No results for input(s): "VITAMINB12", "FOLATE", "FERRITIN", "TIBC", "IRON", "RETICCTPCT" in the last 72 hours. Sepsis Labs: Recent Labs  Lab 07/17/23 1703  PROCALCITON 0.25    Recent Results (from  the past 240 hour(s))  SARS Coronavirus 2 by RT PCR (hospital order, performed in [facility] hospital lab) *cepheid single result test* Anterior Nasal Swab     Status: None   Collection Time: 07/17/23  2:00 PM   Specimen: Anterior Nasal Swab  Result Value Ref Range Status   SARS Coronavirus 2 by RT PCR NEGATIVE NEGATIVE Final    Comment: (NOTE) SARS-CoV-2 target nucleic acids are NOT DETECTED.  The SARS-CoV-2 RNA is generally detectable in upper and lower respiratory specimens during the acute phase of infection. The lowest concentration of SARS-CoV-2 viral copies this assay can detect is 250 copies / mL. A negative result does not preclude SARS-CoV-2 infection and should not be used as the sole basis for treatment or other patient management decisions.  A negative result may occur with improper specimen collection / handling, submission of specimen other than nasopharyngeal swab, presence of viral mutation(s) within the areas targeted by  this assay, and inadequate number of viral copies (<250 copies / mL). A negative result must be combined with clinical observations, patient history, and epidemiological information.  Fact Sheet for Patients:   RoadLapTop.co.za  Fact Sheet for Healthcare Providers: http://kim-miller.com/  This test is not yet approved or  cleared by the [place] FDA and has been authorized for detection and/or diagnosis of SARS-CoV-2 by FDA under an Emergency Use Authorization (EUA).  This EUA will remain in effect (meaning this test can be used) for the duration of the COVID-19 declaration under Section 564(b)(1) of the Act, 21 U.S.C. section 360bbb-3(b)(1), unless the authorization is terminated or revoked sooner.  Performed at [facility], [address], [place], [place] [address]          Radiology Studies: CT Angio Chest PE W and/or Wo Contrast  Result Date: 07/17/2023 CLINICAL DATA:  Shortness of  breath EXAM: CT ANGIOGRAPHY CHEST WITH CONTRAST TECHNIQUE: Multidetector CT imaging of the chest was performed using the standard protocol during bolus administration of intravenous contrast. Multiplanar CT image reconstructions and MIPs were obtained to evaluate the vascular anatomy. RADIATION DOSE REDUCTION: This exam was performed according to the departmental dose-optimization program which includes automated exposure control, adjustment of the mA and/or kV according to patient size and/or use of iterative reconstruction technique. CONTRAST:  75mL OMNIPAQUE IOHEXOL 350 MG/ML SOLN COMPARISON:  None Available. FINDINGS: Cardiovascular: No evidence of pulmonary embolus. Evaluation of the segmental subsegmental pulmonary arteries is somewhat limited due to respiratory motion artifact. Normal heart size. No pericardial effusion. Normal caliber thoracic aorta with mild atherosclerotic disease. Moderate coronary artery calcifications. Mediastinum/Nodes: Esophagus thyroid are unremarkable. No enlarged lymph nodes seen in the chest. Lungs/Pleura: Central airways are patent. Bilateral patchy ground-glass opacities with more focal areas of consolidation and areas of interstitial thickening. No pleural effusion or pneumothorax. Upper Abdomen: Indeterminate nodule of the right adrenal gland measuring 1.6 cm. No acute abnormality. Musculoskeletal: Left total shoulder arthroplasty. Moderate compression deformity of T12, likely chronic. Sclerosis of the inferior sternum adjacent to the costochondral joints and sclerosis of the bilateral first sternocostal joints, likely degenerative. Review of the MIP images confirms the above findings. IMPRESSION: 1. No evidence of pulmonary embolus. Evaluation of the segmental and subsegmental pulmonary arteries is somewhat limited due to respiratory motion artifact. 2. Bilateral patchy ground-glass opacities with more focal areas of consolidation and areas of interstitial thickening, likely  resolving sequela of prior multifocal pneumonia. Recommend follow-up chest CT in 3 months to ensure continued resolution. 3. Indeterminate nodule of the right adrenal gland measuring 1.6 cm. Recommend further evaluation with nonemergent adrenal protocol CT. 4. Coronary artery calcifications and aortic Atherosclerosis (ICD10-I70.0). Electronically Signed   By: Allegra Lai M.D.   On: 07/17/2023 16:34   DG Chest Port 1 View  Result Date: 07/17/2023 CLINICAL DATA:  Shortness of breath.  Recent diagnosis of pneumonia. EXAM: PORTABLE CHEST 1 VIEW COMPARISON:  July 08, 2023 FINDINGS: Cardiomediastinal silhouette is normal. Mediastinal contours appear intact. Compared to the prior radiograph, there is partial resolution of the patchy airspace opacities throughout both lungs, right greater than left. Significant residual remains. Osseous structures are without acute abnormality. Soft tissues are grossly normal. IMPRESSION: Partial resolution of the patchy airspace opacities throughout both lungs, right greater than left. Significant residual remains. Electronically Signed   By: Ted Mcalpine M.D.   On: 07/17/2023 13:30        Scheduled Meds:  citalopram  20 mg Oral Daily   enoxaparin (LOVENOX) injection  40 mg Subcutaneous Q24H   fluticasone furoate-vilanterol  1 puff Inhalation Daily   ipratropium-albuterol  3 mL Nebulization Q6H   predniSONE  40 mg Oral Q breakfast   Continuous Infusions:  vancomycin       LOS: 1 day    Time spent: 35 minutes    Casper Pagliuca Hoover Brunette, DO [facility]  If 7PM-7AM, please contact night-coverage [email or web address] 07/18/2023, 6:57 AM

## 2023-07-18 NOTE — Discharge Instructions (Signed)
  Providers Accepting New Patients in [place], [place]    [facility] [address]  [place], [place] [address] [phone] Accepts most insurances  [facility] [address] [place], [place] [address] [phone] Accepts most insurances  [facility] [place] [address] [place], [place] [address]  [phone] Must meet requirements  [facility] [address]  [place], [place] [address] [phone] Accepts most insurances  [facility] [address]  [place], [place] [address] [phone] Accepts most insurances  [facility] [address]   [place], [place]   [phone] Accepts most insurances  [facility] ([facility])  [address]  [place], [place] [address] [phone] Accepts most insurances     [facility] [address]  [place], [place] [address] [phone] Accepts most insurances  [facility] [address] [place], [place] [address] [phone] option 1 Accepts Medicaid and Uninsured  [facility] [address]  [place], [place] [address] [phone] Accepts most insurances  Tesfaye Fanta, MD [address]  [place], [place] [address] [phone] Accepts most insurances  [facility] [place] [address]  [place], [place] [address] [phone] Accepts most insurances  [facility] [address] [place],  [address] [phone] Accepts most insurances  Zack Hall, MD [address] [place],  [address] [phone]  Accepts most insurances                      

## 2023-07-19 DIAGNOSIS — J9601 Acute respiratory failure with hypoxia: Secondary | ICD-10-CM | POA: Diagnosis not present

## 2023-07-19 LAB — CBC
HCT: 33.4 % — ABNORMAL LOW (ref 36.0–46.0)
Hemoglobin: 10.6 g/dL — ABNORMAL LOW (ref 12.0–15.0)
MCH: 27.7 pg (ref 26.0–34.0)
MCHC: 31.7 g/dL (ref 30.0–36.0)
MCV: 87.4 fL (ref 80.0–100.0)
Platelets: 830 10*3/uL — ABNORMAL HIGH (ref 150–400)
RBC: 3.82 MIL/uL — ABNORMAL LOW (ref 3.87–5.11)
RDW: 16 % — ABNORMAL HIGH (ref 11.5–15.5)
WBC: 16.8 10*3/uL — ABNORMAL HIGH (ref 4.0–10.5)
nRBC: 0 % (ref 0.0–0.2)

## 2023-07-19 LAB — BASIC METABOLIC PANEL
Anion gap: 11 (ref 5–15)
BUN: 13 mg/dL (ref 8–23)
CO2: 22 mmol/L (ref 22–32)
Calcium: 8.7 mg/dL — ABNORMAL LOW (ref 8.9–10.3)
Chloride: 105 mmol/L (ref 98–111)
Creatinine, Ser: 0.41 mg/dL — ABNORMAL LOW (ref 0.44–1.00)
GFR, Estimated: >60 mL/min (ref >60)
Glucose, Bld: 95 mg/dL (ref 70–99)
Potassium: 3.6 mmol/L (ref 3.5–5.1)
Sodium: 138 mmol/L (ref 135–145)

## 2023-07-19 LAB — MAGNESIUM: Magnesium: 2 mg/dL (ref 1.7–2.4)

## 2023-07-19 MED ORDER — BUDESONIDE 0.25 MG/2ML IN SUSP: 0.2500 mg | Freq: Two times a day (BID) | RESPIRATORY_TRACT | Status: DC

## 2023-07-19 MED ORDER — FLUTICASONE FUROATE-VILANTEROL 100-25 MCG/ACT IN AEPB: 1.0000 | INHALATION_SPRAY | Freq: Every day | RESPIRATORY_TRACT | 2 refills | Status: DC

## 2023-07-19 MED ORDER — IPRATROPIUM-ALBUTEROL 0.5-2.5 (3) MG/3ML IN SOLN
3.0000 mL | Freq: Three times a day (TID) | RESPIRATORY_TRACT | Status: DC
  Administered 2023-07-19: 3 mL via RESPIRATORY_TRACT
  Filled 2023-07-19: qty 3

## 2023-07-19 MED ORDER — PREDNISONE 10 MG PO TABS: 40.0000 mg | ORAL_TABLET | Freq: Every day | ORAL | 0 refills | Status: AC

## 2023-07-19 NOTE — Discharge Summary (Signed)
Physician Discharge Summary  Casey [name] WUJ:[id number] DOB: 05-Apr-1961 DOA: 07/17/2023  PCP: Patient, No Pcp Per  Admit date: 07/17/2023  Discharge date: 07/19/2023  Admitted From:Home  Disposition:  Home  Recommendations for Outpatient Follow-up:  Follow up with PCP in 1-2 weeks Continue home breathing treatments as needed for shortness of breath or wheezing Continue prednisone as prescribed for several more days Continue other home medications as prior  Home Health: None  Equipment/Devices: Home 2 L nasal cannula oxygen  Discharge Condition:Stable  CODE STATUS: Full  Diet recommendation: Heart Healthy  Brief/Interim Summary: Casey [name] is a 62 y.o. female with medical history significant of polysubstance abuse, recent admission for aspiration pneumonia and overdose.  The patient was recently discharged on 9/2 after being admitted on 8/17.  She decompensated quickly and needed to be intubated and was transferred to [facility].  She gradually improved and was ultimately discharged on steroids and antibiotics on 9/2.  Since being home, she has gradually declined.  She was admitted with acute hypoxemic respiratory failure secondary to acute COPD exacerbation.  She has no further significant wheezing or chest tightness or cough, however is requiring 2 L nasal cannula at this time and she would benefit from having this at home.  She is otherwise in stable condition for discharge with no other acute events or concerns noted throughout the course of this hospitalization.  Discharge Diagnoses:  Principal Problem:   Acute respiratory failure with hypoxia (HCC) Active Problems:   Polysubstance abuse (HCC)   COPD with acute exacerbation (HCC)   Opioid use disorder  Principal discharge diagnosis: Acute hypoxemic respiratory failure secondary to acute COPD exacerbation.  Discharge Instructions  Discharge Instructions     Diet - low sodium heart healthy   Complete by: As  directed    Increase activity slowly   Complete by: As directed       Allergies as of 07/19/2023   No Known Allergies      Medication List     TAKE these medications    albuterol 108 (90 Base) MCG/ACT inhaler Commonly known as: VENTOLIN HFA Inhale 2 puffs into the lungs every 6 (six) hours as needed for wheezing or shortness of breath.   benzonatate 100 MG capsule Commonly known as: Tessalon Perles Take 1 capsule (100 mg total) by mouth 3 (three) times daily as needed for cough.   cholecalciferol 25 MCG (1000 UNIT) tablet Commonly known as: VITAMIN D3 Take 1,000 Units by mouth daily.   citalopram 20 MG tablet Commonly known as: CELEXA TAKE 1 TABLET BY MOUTH EVERY DAY   fluticasone furoate-vilanterol 100-25 MCG/ACT Aepb Commonly known as: BREO ELLIPTA Inhale 1 puff into the lungs daily. Start taking on: July 20, 2023   guaifenesin 100 MG/5ML syrup Commonly known as: ROBITUSSIN Take 200 mg by mouth 3 (three) times daily as needed for cough.   hydrOXYzine 50 MG tablet Commonly known as: ATARAX Take 1 tablet (50 mg total) by mouth 3 (three) times daily as needed.   ibuprofen 200 MG tablet Commonly known as: ADVIL Take 200 mg by mouth every 6 (six) hours as needed for fever, headache or mild pain.   Melatonin 10 MG Tabs Take 10 mg by mouth at bedtime.   predniSONE 10 MG tablet Commonly known as: DELTASONE Take 4 tablets (40 mg total) by mouth daily for 5 days.               Durable Medical Equipment  (From admission, onward)  Start     Ordered   07/19/23 0931  For home use only DME oxygen  Once       Question Answer Comment  Length of Need Lifetime   Mode or (Route) Nasal cannula   Liters per Minute 2   Frequency Continuous (stationary and portable oxygen unit needed)   Oxygen conserving device Yes   Oxygen delivery system Gas      07/19/23 0930            No Known  Allergies  Consultations: None   Procedures/Studies: CT Angio Chest PE W and/or Wo Contrast  Result Date: 07/17/2023 CLINICAL DATA:  Shortness of breath EXAM: CT ANGIOGRAPHY CHEST WITH CONTRAST TECHNIQUE: Multidetector CT imaging of the chest was performed using the standard protocol during bolus administration of intravenous contrast. Multiplanar CT image reconstructions and MIPs were obtained to evaluate the vascular anatomy. RADIATION DOSE REDUCTION: This exam was performed according to the departmental dose-optimization program which includes automated exposure control, adjustment of the mA and/or kV according to patient size and/or use of iterative reconstruction technique. CONTRAST:  75mL OMNIPAQUE IOHEXOL 350 MG/ML SOLN COMPARISON:  None Available. FINDINGS: Cardiovascular: No evidence of pulmonary embolus. Evaluation of the segmental subsegmental pulmonary arteries is somewhat limited due to respiratory motion artifact. Normal heart size. No pericardial effusion. Normal caliber thoracic aorta with mild atherosclerotic disease. Moderate coronary artery calcifications. Mediastinum/Nodes: Esophagus thyroid are unremarkable. No enlarged lymph nodes seen in the chest. Lungs/Pleura: Central airways are patent. Bilateral patchy ground-glass opacities with more focal areas of consolidation and areas of interstitial thickening. No pleural effusion or pneumothorax. Upper Abdomen: Indeterminate nodule of the right adrenal gland measuring 1.6 cm. No acute abnormality. Musculoskeletal: Left total shoulder arthroplasty. Moderate compression deformity of T12, likely chronic. Sclerosis of the inferior sternum adjacent to the costochondral joints and sclerosis of the bilateral first sternocostal joints, likely degenerative. Review of the MIP images confirms the above findings. IMPRESSION: 1. No evidence of pulmonary embolus. Evaluation of the segmental and subsegmental pulmonary arteries is somewhat limited due to  respiratory motion artifact. 2. Bilateral patchy ground-glass opacities with more focal areas of consolidation and areas of interstitial thickening, likely resolving sequela of prior multifocal pneumonia. Recommend follow-up chest CT in 3 months to ensure continued resolution. 3. Indeterminate nodule of the right adrenal gland measuring 1.6 cm. Recommend further evaluation with nonemergent adrenal protocol CT. 4. Coronary artery calcifications and aortic Atherosclerosis (ICD10-I70.0). Electronically Signed   By: Allegra Lai M.D.   On: 07/17/2023 16:34   DG Chest Port 1 View  Result Date: 07/17/2023 CLINICAL DATA:  Shortness of breath.  Recent diagnosis of pneumonia. EXAM: PORTABLE CHEST 1 VIEW COMPARISON:  July 08, 2023 FINDINGS: Cardiomediastinal silhouette is normal. Mediastinal contours appear intact. Compared to the prior radiograph, there is partial resolution of the patchy airspace opacities throughout both lungs, right greater than left. Significant residual remains. Osseous structures are without acute abnormality. Soft tissues are grossly normal. IMPRESSION: Partial resolution of the patchy airspace opacities throughout both lungs, right greater than left. Significant residual remains. Electronically Signed   By: Ted Mcalpine M.D.   On: 07/17/2023 13:30   DG Swallowing Func-Speech Pathology  Result Date: 07/12/2023 Table formatting from the original result was not included. Modified Barium Swallow Study Patient Details Name: Casey [name] MRN: [id number] Date of Birth: 18-Jul-1961 Today's Date: 07/12/2023 HPI/PMH: History of Present Illness Sophi Obrien Hott is a 62 yo female who presented to [facility] ED 8/16 after being found at  home unresponsive. Per note, pt's son administered "at home narcan", which is vinegar, while pt was unresponsive. EMS administered Narcan with improvement in mental status. In ED, was hypothermic, tachycardic, SpO2 89% on NRB. UDS + for cocaine, BZD, and THC. CXR with  asymmetric bibasilar interstitial and airspace infiltrates c/f multifocal PNA/aspiration. On 8/17, pt with persistent respiratory distress with escalating O2 requirements to HFNC and eventually BiPAP and worsening CXR with rapid progression of bilateral heterogenous consolidative airspace disease in LUL and R lung. Transferred to [facility] 8/17. Required ETT 8/20-25. FEES 8/26 recommended nectar thick liquids. Extubated to HHFNC 55L. PMH includes chronic pain, anxiety/depression, panic disorders, suicide attempt, polysubstance abuse Clinical Impression Pt demonstrates improvement in airway protection. Swallow initiation still slightly late resulting in consistent trace penetration with ejection over multiple swallows. Pt had one instance of aspiration when trying to take a large pill; she gagged while holding glottic closure and had mild aspiration which she sensed and ejected. Advised large pills whole in puree. Overall sensation and respiratory endurance have improved. Pt may resume a regular diet and thin liquids. No SLP f/u needed will sign off. Factors that may increase risk of adverse event in presence of aspiration Rubye Oaks [employer] Clearance Coots 2021): No data recorded Recommendations/Plan: Swallowing Evaluation Recommendations Swallowing Evaluation Recommendations Recommendations: PO diet PO Diet Recommendation: Regular; Thin liquids (Level 0) Liquid Administration via: Cup; Straw Medication Administration: Whole meds with puree Swallowing strategies  : Slow rate Postural changes: Position pt fully upright for meals Oral care recommendations: Oral care BID (2x/day) Treatment Plan Treatment Plan Treatment recommendations: No treatment recommended at this time Follow-up recommendations: No SLP follow up Recommendations Recommendations for follow up therapy are one component of a multi-disciplinary discharge planning process, led by the attending physician.  Recommendations may be updated based on patient status, additional  functional criteria and insurance authorization. Assessment: Orofacial Exam: No data recorded Anatomy: Anatomy: WFL Boluses Administered: Boluses Administered Boluses Administered: Thin liquids (Level 0); Puree; Solid  Oral Impairment Domain: Oral Impairment Domain Lip Closure: No labial escape Tongue control during bolus hold: Cohesive bolus between tongue to palatal seal Bolus preparation/mastication: Timely and efficient chewing and mashing Bolus transport/lingual motion: Brisk tongue motion Oral residue: Complete oral clearance Initiation of pharyngeal swallow : Pyriform sinuses  Pharyngeal Impairment Domain: Pharyngeal Impairment Domain Soft palate elevation: No bolus between soft palate (SP)/pharyngeal wall (PW) Laryngeal elevation: Complete superior movement of thyroid cartilage with complete approximation of arytenoids to epiglottic petiole Anterior hyoid excursion: Complete anterior movement Epiglottic movement: Complete inversion Laryngeal vestibule closure: Complete, no air/contrast in laryngeal vestibule Pharyngeal stripping wave : Present - complete Pharyngeal contraction (A/P view only): N/A Pharyngoesophageal segment opening: Complete distension and complete duration, no obstruction of flow Tongue base retraction: No contrast between tongue base and posterior pharyngeal wall (PPW) Pharyngeal residue: Complete pharyngeal clearance  Esophageal Impairment Domain: No data recorded Pill: Pill Consistency administered: Thin liquids (Level 0) Thin liquids (Level 0): Impaired (see clinical impressions) (Pt gagged and aspirated with sensation, ejected thin) Penetration/Aspiration Scale Score: Penetration/Aspiration Scale Score 1.  Material does not enter airway: Puree; Solid 3.  Material enters airway, remains ABOVE vocal cords and not ejected out: Thin liquids (Level 0) 6.  Material enters airway, passes BELOW cords then ejected out: Thin liquids (Level 0) Compensatory Strategies: No data recorded  General  Information: Caregiver present: No  Diet Prior to this Study: Dysphagia 3 (mechanical soft); Mildly thick liquids (Level 2, nectar thick)   Temperature : Normal   Respiratory Status:  WFL   Supplemental O2: None (Room air)   History of Recent Intubation: Yes  Behavior/Cognition: Alert; Cooperative; Pleasant mood Self-Feeding Abilities: Able to self-feed Baseline vocal quality/speech: Normal Volitional Cough: Able to elicit Volitional Swallow: Able to elicit Exam Limitations: No limitations Goal Planning: No data recorded No data recorded No data recorded No data recorded No data recorded Pain: No data recorded End of Session: Start Time:SLP Start Time (ACUTE ONLY): 0830 Stop Time: SLP Stop Time (ACUTE ONLY): 0840 Time Calculation:SLP Time Calculation (min) (ACUTE ONLY): 10 min Charges: SLP Evaluations $ SLP Speech Visit: 1 Visit SLP Evaluations $MBS Swallow: 1 Procedure SLP visit diagnosis: SLP Visit Diagnosis: Dysphagia, oropharyngeal phase (R13.12) Past Medical History: Past Medical History: Diagnosis Date  Anxiety   Chronic back pain   Depression   History of panic attacks 09/11/2014  Medical history non-contributory   Polysubstance abuse (HCC)   History of taking non-prescribed opiates, BZDs; also +cocaine and THC  Ruptured disc, thoracic   Suicide attempt (HCC) 2002 Past Surgical History: Past Surgical History: Procedure Laterality Date  CESAREAN SECTION    CLAVICLE SURGERY    REVERSE SHOULDER ARTHROPLASTY Left 01/01/2020  Procedure: LEFT REVERSE SHOULDER ARTHROPLASTY;  Surgeon: Cammy Copa, MD;  Location: [facility] OR;  Service: Orthopedics;  Laterality: Left;  TUBAL LIGATION   DeBlois, Riley Nearing 07/12/2023, 8:57 AM  DG CHEST PORT 1 VIEW  Result Date: 07/08/2023 CLINICAL DATA:  Respiratory failure.  Pneumonia. EXAM: PORTABLE CHEST 1 VIEW COMPARISON:  July 04, 2023. FINDINGS: The heart size and mediastinal contours are within normal limits. Status post left shoulder arthroplasty. Endotracheal tube has  been removed. Feeding tube is seen entering stomach. Stable bilateral lung opacities are noted, right greater than left, consistent with multifocal pneumonia. IMPRESSION: Stable bilateral lung opacities consistent with multifocal pneumonia. Electronically Signed   By: Lupita Raider M.D.   On: 07/08/2023 14:48   DG Abd 1 View  Result Date: 07/06/2023 CLINICAL DATA:  Vomiting and abdominal pain.  Tube placement. EXAM: ABDOMEN - 1 VIEW COMPARISON:  06/30/2023 FINDINGS: Soft feeding tube has its tip in the midportion of the stomach. There is no sign of small or large bowel obstruction. No evidence of free air. No abnormal calcifications or acute bone findings. IMPRESSION: Soft feeding tube tip in the midportion of the stomach. Electronically Signed   By: Paulina Fusi M.D.   On: 07/06/2023 18:23   DG Chest Port 1 View  Result Date: 07/04/2023 CLINICAL DATA:  Respiratory failure. EXAM: PORTABLE CHEST 1 VIEW COMPARISON:  Chest radiographs 07/03/2023 FINDINGS: An endotracheal tube and right upper extremity PICC are unchanged. A feeding tube courses into the abdomen with tip not imaged. The cardiomediastinal silhouette is unchanged allowing for mild rightward patient rotation on the current examination. Widespread interstitial and patchy, predominantly peripheral airspace opacities in both lungs have not significantly changed. No sizable pleural effusion or pneumothorax is identified. IMPRESSION: Unchanged widespread bilateral lung infiltrates. Electronically Signed   By: Sebastian Ache M.D.   On: 07/04/2023 12:33   DG Chest Port 1 View  Result Date: 07/03/2023 CLINICAL DATA:  Respirator dependent EXAM: PORTABLE CHEST 1 VIEW COMPARISON:  07/01/2023 FINDINGS: Unchanged support apparatus including endotracheal tube, partially imaged enteric feeding tube, and right upper extremity PICC. Mild cardiomegaly with unchanged diffuse interstitial and heterogeneous airspace opacity, more conspicuous in the right lung.  Status post left shoulder reverse arthroplasty. IMPRESSION: 1. Unchanged support apparatus including endotracheal tube, partially imaged enteric feeding tube, and right upper extremity PICC.  2. Mild cardiomegaly with unchanged diffuse interstitial and heterogeneous airspace opacity, more conspicuous in the right lung. No new airspace opacity. Electronically Signed   By: Jearld Lesch M.D.   On: 07/03/2023 12:17   DG Chest Port 1 View  Result Date: 07/01/2023 CLINICAL DATA:  Respiratory failure EXAM: PORTABLE CHEST 1 VIEW COMPARISON:  06/29/2023. FINDINGS: Endotracheal tube tip just below thoracic inlet. Right-sided PICC tip distal SVC. Feeding tube tip below the diaphragm and off x-ray. Unremarkable cardiac silhouette. There is vascular congestion. Areas of alveolar opacity may represent pneumonitis or edema with interval stability. IMPRESSION: Stable patchy interstitial and alveolar opacities that may represent atypical pneumonia or edema. Electronically Signed   By: Layla Maw M.D.   On: 07/01/2023 09:53   DG Abd Portable 1V  Result Date: 06/30/2023 CLINICAL DATA:  Feeding tube placement EXAM: PORTABLE ABDOMEN - 1 VIEW COMPARISON:  09/04/2019 FINDINGS: The feeding tube tip is in the stomach antrum, oriented towards the pyloric channel. Visualized bowel gas pattern is unremarkable. IMPRESSION: 1. Feeding tube tip is in the stomach antrum, oriented towards the pyloric channel. Electronically Signed   By: Gaylyn Rong M.D.   On: 06/30/2023 16:35   [place] EKG SITE RITE  Result Date: 06/30/2023 If Site Rite image not attached, placement could not be confirmed due to current cardiac rhythm.  DG CHEST PORT 1 VIEW  Result Date: 06/29/2023 CLINICAL DATA:  Encounter for intubation. EXAM: PORTABLE CHEST 1 VIEW COMPARISON:  June 29, 2023 FINDINGS: Endotracheal tube terminates 4.7 cm above the carina. Enteric catheter folds on itself within the gastric body/cardia. No evidence of pneumothorax. Diffuse  bilateral patchy airspace consolidation, similar to patient's prior radiographs. IMPRESSION: 1. Endotracheal tube terminates 4.7 cm above the carina. 2. Enteric catheter folds on itself within the gastric body/cardia. 3. Diffuse bilateral patchy airspace consolidation, similar to patient's prior radiographs. Electronically Signed   By: Ted Mcalpine M.D.   On: 06/29/2023 12:43   DG CHEST PORT 1 VIEW  Result Date: 06/29/2023 CLINICAL DATA:  62 year old female with history of shortness of breath and hypoxia. EXAM: PORTABLE CHEST 1 VIEW COMPARISON:  Chest x-ray 06/28/2023. FINDINGS: Widespread interstitial prominence, peribronchial cuffing, and patchy multifocal airspace consolidation scattered asymmetrically throughout the lungs bilaterally (right greater than left) with relative sparing of the left upper lobe. Overall, aeration appears slightly improved compared to the prior study. No definite pleural effusions. No pneumothorax. Pulmonary vasculature does not appear engorged. Heart size is normal. The patient is rotated to the right on today's exam, resulting in distortion of the mediastinal contours and reduced diagnostic sensitivity and specificity for mediastinal pathology. Status post left shoulder arthroplasty. IMPRESSION: 1. Severe multilobar bilateral pneumonia redemonstrated (right greater than left) with slightly improved aeration compared to the prior study. 2. Previously noted right-sided pleural effusion has resolved. 3. Aortic atherosclerosis. Electronically Signed   By: Trudie Reed M.D.   On: 06/29/2023 07:28   DG CHEST PORT 1 VIEW  Result Date: 06/28/2023 CLINICAL DATA:  Hypoxia EXAM: PORTABLE CHEST 1 VIEW COMPARISON:  Chest x-ray 06/27/2023 FINDINGS: Cardiomediastinal silhouette is stable. Bilateral multifocal airspace opacities have not significantly changed. Small right pleural effusion persists. There is no pneumothorax. The osseous structures are stable. IMPRESSION: Unchanged  bilateral multifocal airspace opacities and small right pleural effusion. Electronically Signed   By: Darliss Cheney M.D.   On: 06/28/2023 16:39   DG Chest Port 1 View  Result Date: 06/27/2023 CLINICAL DATA:  Multifocal pneumonia. EXAM: PORTABLE CHEST 1 VIEW COMPARISON:  06/26/2023  FINDINGS: Rightward patient rotation. The right greater than left patchy and nodular airspace opacities are similar in the interval. Underlying diffuse interstitial opacity again noted. The cardio pericardial silhouette is enlarged. Bones are diffusely demineralized. Telemetry leads overlie the chest. IMPRESSION: 1. No substantial interval change in the right greater than left patchy and nodular airspace opacities compatible with asymmetric edema or diffuse infection. 2. Underlying diffuse interstitial opacity again noted. Electronically Signed   By: Kennith Center M.D.   On: 06/27/2023 07:46   DG CHEST PORT 1 VIEW  Result Date: 06/26/2023 CLINICAL DATA:  Chest pain, shortness of breath EXAM: PORTABLE CHEST 1 VIEW COMPARISON:  06/26/2023 FINDINGS: Diffuse bilateral heterogeneous and consolidative airspace disease, increased in the peripheral left upper lobe, similar throughout the right lung. Small, layering right pleural effusion. Heart and mediastinum are normal. Status post left shoulder reverse arthroplasty. IMPRESSION: 1. Diffuse bilateral heterogeneous and consolidative airspace disease, increased in the peripheral left upper lobe, similar throughout the right lung. Findings are consistent with worsened multifocal pneumonia or edema. 2. Small, layering right pleural effusion. Electronically Signed   By: Jearld Lesch M.D.   On: 06/26/2023 18:06   DG CHEST PORT 1 VIEW  Result Date: 06/26/2023 CLINICAL DATA:  Tachypnea EXAM: PORTABLE CHEST 1 VIEW COMPARISON:  06/25/2023 FINDINGS: Multifocal pulmonary infiltrates have rapidly progressed in the interval, asymmetrically more severe throughout the right lung, in keeping with  progressive multifocal pneumonic infiltrate. No pneumothorax or pleural effusion. Cardiac size within normal limits. No acute bone abnormality. IMPRESSION: 1. Rapid interval progression of multifocal pneumonic infiltrate. Electronically Signed   By: Helyn Numbers M.D.   On: 06/26/2023 01:11   DG Chest 2 View  Result Date: 06/25/2023 CLINICAL DATA:  Dyspnea EXAM: CHEST - 2 VIEW COMPARISON:  09/04/2019 FINDINGS: Asymmetric bibasilar interstitial and airspace infiltrates are present, more focal within the right mid lung zone in keeping with changes of multifocal pneumonia and/or aspiration in the appropriate clinical setting. No pneumothorax or pleural effusion. Cardiac size within normal limits. Pulmonary vascularity is normal. No acute bone abnormality. Left total shoulder arthroplasty is partially visualized. IMPRESSION: 1. Asymmetric bibasilar interstitial and airspace infiltrates, more focal within the right mid lung zone in keeping with changes of multifocal pneumonia and/or aspiration in the appropriate clinical setting. Electronically Signed   By: Helyn Numbers M.D.   On: 06/25/2023 21:31     Discharge Exam: Vitals:   07/19/23 0732 07/19/23 0851  BP:  (!) 156/74  Pulse:  75  Resp:    Temp:    SpO2: 95%    Vitals:   07/19/23 0114 07/19/23 0527 07/19/23 0732 07/19/23 0851  BP:  (!) 159/85  (!) 156/74  Pulse:  71  75  Resp:  18    Temp:  (!) 97.5 F (36.4 C)    TempSrc:      SpO2: 94% 94% 95%   Weight:      Height:        General: Pt is alert, awake, not in acute distress Cardiovascular: RRR, S1/S2 +, no rubs, no gallops Respiratory: CTA bilaterally, no wheezing, no rhonchi Abdominal: Soft, NT, ND, bowel sounds + Extremities: no edema, no cyanosis    The results of significant diagnostics from this hospitalization (including imaging, microbiology, ancillary and laboratory) are listed below for reference.     Microbiology: Recent Results (from the past 240 hour(s))  SARS  Coronavirus 2 by RT PCR (hospital order, performed in [facility] hospital lab) *cepheid single result test* Anterior  Nasal Swab     Status: None   Collection Time: 07/17/23  2:00 PM   Specimen: Anterior Nasal Swab  Result Value Ref Range Status   SARS Coronavirus 2 by RT PCR NEGATIVE NEGATIVE Final    Comment: (NOTE) SARS-CoV-2 target nucleic acids are NOT DETECTED.  The SARS-CoV-2 RNA is generally detectable in upper and lower respiratory specimens during the acute phase of infection. The lowest concentration of SARS-CoV-2 viral copies this assay can detect is 250 copies / mL. A negative result does not preclude SARS-CoV-2 infection and should not be used as the sole basis for treatment or other patient management decisions.  A negative result may occur with improper specimen collection / handling, submission of specimen other than nasopharyngeal swab, presence of viral mutation(s) within the areas targeted by this assay, and inadequate number of viral copies (<250 copies / mL). A negative result must be combined with clinical observations, patient history, and epidemiological information.  Fact Sheet for Patients:   RoadLapTop.co.za  Fact Sheet for Healthcare Providers: http://kim-miller.com/  This test is not yet approved or  cleared by the [place] FDA and has been authorized for detection and/or diagnosis of SARS-CoV-2 by FDA under an Emergency Use Authorization (EUA).  This EUA will remain in effect (meaning this test can be used) for the duration of the COVID-19 declaration under Section 564(b)(1) of the Act, 21 U.S.C. section 360bbb-3(b)(1), unless the authorization is terminated or revoked sooner.  Performed at [facility], [address], [place], [place] [address]      Labs: BNP (last 3 results) Recent Labs    07/17/23 1707  BNP 18.0   Basic Metabolic Panel: Recent Labs  Lab 07/17/23 1254 07/18/23 0405  07/19/23 0435  NA 137 138 138  K 3.0* 3.7 3.6  CL 107 105 105  CO2 21* 20* 22  GLUCOSE 106* 142* 95  BUN 8 12 13   CREATININE 0.44 0.48 0.41*  CALCIUM 8.4* 9.0 8.7*  MG  --   --  2.0   Liver Function Tests: No results for input(s): "AST", "ALT", "ALKPHOS", "BILITOT", "PROT", "ALBUMIN" in the last 168 hours. No results for input(s): "LIPASE", "AMYLASE" in the last 168 hours. No results for input(s): "AMMONIA" in the last 168 hours. CBC: Recent Labs  Lab 07/17/23 1254 07/18/23 0405 07/19/23 0435  WBC 14.9* 14.2* 16.8*  NEUTROABS 10.3*  --   --   HGB 12.1 11.3* 10.6*  HCT 37.3 35.1* 33.4*  MCV 85.6 86.2 87.4  PLT 900* 851* 830*   Cardiac Enzymes: No results for input(s): "CKTOTAL", "CKMB", "CKMBINDEX", "TROPONINI" in the last 168 hours. BNP: Invalid input(s): "POCBNP" CBG: No results for input(s): "GLUCAP" in the last 168 hours. D-Dimer No results for input(s): "DDIMER" in the last 72 hours. Hgb A1c No results for input(s): "HGBA1C" in the last 72 hours. Lipid Profile No results for input(s): "CHOL", "HDL", "LDLCALC", "TRIG", "CHOLHDL", "LDLDIRECT" in the last 72 hours. Thyroid function studies No results for input(s): "TSH", "T4TOTAL", "T3FREE", "THYROIDAB" in the last 72 hours.  Invalid input(s): "FREET3" Anemia work up No results for input(s): "VITAMINB12", "FOLATE", "FERRITIN", "TIBC", "IRON", "RETICCTPCT" in the last 72 hours. Urinalysis    Component Value Date/Time   COLORURINE YELLOW 06/26/2023 0134   APPEARANCEUR CLEAR 06/26/2023 0134   LABSPEC 1.014 06/26/2023 0134   PHURINE 5.0 06/26/2023 0134   GLUCOSEU NEGATIVE 06/26/2023 0134   HGBUR SMALL (A) 06/26/2023 0134   BILIRUBINUR NEGATIVE 06/26/2023 0134   KETONESUR NEGATIVE 06/26/2023 0134  PROTEINUR NEGATIVE 06/26/2023 0134   UROBILINOGEN 0.2 05/02/2009 1015   NITRITE NEGATIVE 06/26/2023 0134   LEUKOCYTESUR NEGATIVE 06/26/2023 0134   Sepsis Labs Recent Labs  Lab 07/17/23 1254 07/18/23 0405  07/19/23 0435  WBC 14.9* 14.2* 16.8*   Microbiology Recent Results (from the past 240 hour(s))  SARS Coronavirus 2 by RT PCR (hospital order, performed in [facility] hospital lab) *cepheid single result test* Anterior Nasal Swab     Status: None   Collection Time: 07/17/23  2:00 PM   Specimen: Anterior Nasal Swab  Result Value Ref Range Status   SARS Coronavirus 2 by RT PCR NEGATIVE NEGATIVE Final    Comment: (NOTE) SARS-CoV-2 target nucleic acids are NOT DETECTED.  The SARS-CoV-2 RNA is generally detectable in upper and lower respiratory specimens during the acute phase of infection. The lowest concentration of SARS-CoV-2 viral copies this assay can detect is 250 copies / mL. A negative result does not preclude SARS-CoV-2 infection and should not be used as the sole basis for treatment or other patient management decisions.  A negative result may occur with improper specimen collection / handling, submission of specimen other than nasopharyngeal swab, presence of viral mutation(s) within the areas targeted by this assay, and inadequate number of viral copies (<250 copies / mL). A negative result must be combined with clinical observations, patient history, and epidemiological information.  Fact Sheet for Patients:   RoadLapTop.co.za  Fact Sheet for Healthcare Providers: http://kim-miller.com/  This test is not yet approved or  cleared by the [place] FDA and has been authorized for detection and/or diagnosis of SARS-CoV-2 by FDA under an Emergency Use Authorization (EUA).  This EUA will remain in effect (meaning this test can be used) for the duration of the COVID-19 declaration under Section 564(b)(1) of the Act, 21 U.S.C. section 360bbb-3(b)(1), unless the authorization is terminated or revoked sooner.  Performed at [facility], [address] [place], [place] [address]      Time coordinating discharge: 35  minutes  SIGNED:   Erick Blinks, DO [facility] 07/19/2023, 9:34 AM  If 7PM-7AM, please contact night-coverage [email or web address]

## 2023-07-19 NOTE — Progress Notes (Addendum)
SATURATION QUALIFICATIONS: (This note is used to comply with regulatory documentation for home oxygen)  Patient Saturations on Room Air at Rest = 94%  Patient Saturations on Room Air while Ambulating = 88%  Patient Saturations on 2 Liters of oxygen while Ambulating = 92%

## 2023-07-19 NOTE — Progress Notes (Signed)
Mobility Specialist Progress Note:    07/19/23 1030  Mobility  Activity Ambulated with assistance in hallway  Level of Assistance Modified independent, requires aide device or extra time  Assistive Device None;Other (Comment) (IV pole)  Distance Ambulated (ft) 70 ft  Range of Motion/Exercises Active;All extremities  Activity Response Tolerated well  Mobility Referral Yes  $Mobility charge 1 Mobility  Mobility Specialist Start Time (ACUTE ONLY) 1030  Mobility Specialist Stop Time (ACUTE ONLY) 1040  Mobility Specialist Time Calculation (min) (ACUTE ONLY) 10 min   Pt received in bed, agreeable to mobility. Required SBA to stand and ambulate with the IV pole for stability. Tolerated well, SpO2 97% on 2L throughout session. Denies SOB during ambulation,  post ambulation pt was coughing and had audible SOB. Recovered, SpO2 94% on 2L. Returned pt to bed, all needs met.   Lawerance Bach Mobility Specialist Please contact via Special educational needs teacher or  Rehab office at [phone]

## 2023-07-19 NOTE — TOC Initial Note (Signed)
Transition of Care [facility]) - Initial/Assessment Note    Patient Details  Name: Casey [name] MRN: [id number] Date of Birth: 1961/09/15  Transition of Care [facility]) CM/SW Contact:    Elliot Gault, LCSW Phone Number: 07/19/2023, 10:47 AM  Clinical Narrative:                  Pt from home and medically stable for dc today per MD. Home O2 orders placed. Spoke with pt to review dc planning and in-network options for O2. Referred to [employer] at pt request.  Portable tank will be delivered to pt's room and then [employer] will coordinate with pt for home setup.  No other TOC needs for dc.  Expected Discharge Plan: Home/Self Care Barriers to Discharge: Barriers Resolved   Patient Goals and CMS Choice Patient states their goals for this hospitalization and ongoing recovery are:: return home CMS Medicare.gov Compare Post Acute Care list provided to:: Patient Choice offered to / list presented to : Patient      Expected Discharge Plan and Services In-house Referral: Clinical Social Work   Post Acute Care Choice: Durable Medical Equipment Living arrangements for the past 2 months: Single Family Home Expected Discharge Date: 07/19/23               DME Arranged: Oxygen DME Agency: [employer] Date DME Agency Contacted: 07/19/23   Representative spoke with at DME Agency: Ian Malkin            Prior Living Arrangements/Services Living arrangements for the past 2 months: Single Family Home Lives with:: Adult Children Patient language and need for interpreter reviewed:: Yes Do you feel safe going back to the place where you live?: Yes      Need for Family Participation in Patient Care: No (Comment)     Criminal Activity/Legal Involvement Pertinent to Current Situation/Hospitalization: No - Comment as needed  Activities of Daily Living Home Assistive Devices/Equipment: None ADL Screening (condition at time of admission) Patient's cognitive ability adequate to safely complete daily  activities?: Yes Is the patient deaf or have difficulty hearing?: No Does the patient have difficulty seeing, even when wearing glasses/contacts?: No Does the patient have difficulty concentrating, remembering, or making decisions?: No Patient able to express need for assistance with ADLs?: Yes Does the patient have difficulty dressing or bathing?: Yes Independently performs ADLs?: No Does the patient have difficulty walking or climbing stairs?: Yes Weakness of Legs: Both Weakness of Arms/Hands: Both  Permission Sought/Granted Permission sought to share information with : Facility Industrial/product designer granted to share information with : Yes, Verbal Permission Granted     Permission granted to share info w AGENCY: DME        Emotional Assessment Appearance:: Appears stated age Attitude/Demeanor/Rapport: Engaged Affect (typically observed): Pleasant Orientation: : Oriented to Self, Oriented to Place, Oriented to  Time, Oriented to Situation Alcohol / Substance Use: Illicit Drugs Psych Involvement: No (comment)  Admission diagnosis:  Acute respiratory failure with hypoxia (HCC) [J96.01] Acute hypoxemic respiratory failure (HCC) [J96.01] Multifocal pneumonia [J18.9] Patient Active Problem List   Diagnosis Date Noted   Opioid use disorder 07/17/2023   Pneumonia of right upper lobe due to infectious organism 07/08/2023   Delirium 07/07/2023   Vomiting without nausea 07/07/2023   COPD with acute exacerbation (HCC) 07/06/2023   Anemia 07/06/2023   Pneumonia of right lower lobe due to infectious organism 07/06/2023   Nausea and vomiting 07/05/2023   Malnutrition of moderate degree 06/29/2023   Acute respiratory failure with  hypoxia (HCC) 06/26/2023   Polysubstance abuse (HCC) 06/26/2023   Acute metabolic encephalopathy 06/26/2023   Aspiration pneumonia (HCC) 06/25/2023   Primary insomnia 08/20/2021   Depression, recurrent (HCC) 08/20/2021   GAD (generalized anxiety  disorder) 08/20/2021   Elevated blood pressure reading in office without diagnosis of hypertension 08/20/2021   Closed fracture of left proximal humerus 01/01/2020   Fracture of base of fifth metacarpal bone of right hand 10/06/2016   Benzodiazepine dependence, continuous (HCC) 01/09/2016   History of panic attacks 09/11/2014   Malaise 09/03/2014   Fatigue 09/03/2014   Body mass index (BMI) of 20.0-20.9 in adult 09/03/2014   Right clavicle fracture 12/12/2013   PCP:  Patient, No Pcp Per Pharmacy:   [id number] - [place], [place] - [address] [place] [address] Phone: [phone] Fax: [phone]     Social Determinants of Health (SDOH) Social History: SDOH Screenings   Food Insecurity: No Food Insecurity (07/17/2023)  Housing: Low Risk  (07/17/2023)  Transportation Needs: No Transportation Needs (07/17/2023)  Utilities: Not At Risk (07/17/2023)  Depression (PHQ2-9): Medium Risk (02/24/2023)  Tobacco Use: High Risk (07/17/2023)   SDOH Interventions:     Readmission Risk Interventions     No data to display

## 2023-07-26 ENCOUNTER — Emergency Department (HOSPITAL_COMMUNITY): Payer: MEDICAID

## 2023-07-26 ENCOUNTER — Encounter (HOSPITAL_COMMUNITY): Payer: Self-pay

## 2023-07-26 ENCOUNTER — Other Ambulatory Visit: Payer: Self-pay

## 2023-07-26 ENCOUNTER — Inpatient Hospital Stay (HOSPITAL_COMMUNITY)
Admission: EM | Admit: 2023-07-26 | Discharge: 2023-07-31 | DRG: 871 | Disposition: A | Discharge status: Home / Self Care | Payer: MEDICAID | Attending: Internal Medicine | Admitting: Internal Medicine

## 2023-07-26 DIAGNOSIS — Z9981 Dependence on supplemental oxygen: Secondary | ICD-10-CM | POA: Diagnosis not present

## 2023-07-26 DIAGNOSIS — J9621 Acute and chronic respiratory failure with hypoxia: Secondary | ICD-10-CM | POA: Diagnosis present

## 2023-07-26 DIAGNOSIS — Z1152 Encounter for screening for COVID-19: Secondary | ICD-10-CM

## 2023-07-26 DIAGNOSIS — F1290 Cannabis use, unspecified, uncomplicated: Secondary | ICD-10-CM | POA: Diagnosis present

## 2023-07-26 DIAGNOSIS — F17210 Nicotine dependence, cigarettes, uncomplicated: Secondary | ICD-10-CM | POA: Diagnosis present

## 2023-07-26 DIAGNOSIS — R030 Elevated blood-pressure reading, without diagnosis of hypertension: Secondary | ICD-10-CM | POA: Diagnosis present

## 2023-07-26 DIAGNOSIS — D649 Anemia, unspecified: Secondary | ICD-10-CM | POA: Diagnosis present

## 2023-07-26 DIAGNOSIS — Y95 Nosocomial condition: Secondary | ICD-10-CM | POA: Diagnosis present

## 2023-07-26 DIAGNOSIS — E43 Unspecified severe protein-calorie malnutrition: Secondary | ICD-10-CM | POA: Diagnosis present

## 2023-07-26 DIAGNOSIS — Z6821 Body mass index (BMI) 21.0-21.9, adult: Secondary | ICD-10-CM | POA: Diagnosis not present

## 2023-07-26 DIAGNOSIS — F1410 Cocaine abuse, uncomplicated: Secondary | ICD-10-CM | POA: Diagnosis present

## 2023-07-26 DIAGNOSIS — Z96612 Presence of left artificial shoulder joint: Secondary | ICD-10-CM | POA: Diagnosis present

## 2023-07-26 DIAGNOSIS — Z79899 Other long term (current) drug therapy: Secondary | ICD-10-CM

## 2023-07-26 DIAGNOSIS — Z765 Malingerer [conscious simulation]: Secondary | ICD-10-CM | POA: Diagnosis not present

## 2023-07-26 DIAGNOSIS — F1110 Opioid abuse, uncomplicated: Secondary | ICD-10-CM | POA: Diagnosis present

## 2023-07-26 DIAGNOSIS — A419 Sepsis, unspecified organism: Secondary | ICD-10-CM | POA: Diagnosis present

## 2023-07-26 DIAGNOSIS — Z8659 Personal history of other mental and behavioral disorders: Secondary | ICD-10-CM

## 2023-07-26 DIAGNOSIS — E440 Moderate protein-calorie malnutrition: Secondary | ICD-10-CM | POA: Diagnosis present

## 2023-07-26 DIAGNOSIS — R739 Hyperglycemia, unspecified: Secondary | ICD-10-CM | POA: Diagnosis present

## 2023-07-26 DIAGNOSIS — R54 Age-related physical debility: Secondary | ICD-10-CM | POA: Diagnosis present

## 2023-07-26 DIAGNOSIS — F1910 Other psychoactive substance abuse, uncomplicated: Secondary | ICD-10-CM | POA: Diagnosis present

## 2023-07-26 DIAGNOSIS — F1220 Cannabis dependence, uncomplicated: Secondary | ICD-10-CM | POA: Diagnosis present

## 2023-07-26 DIAGNOSIS — J440 Chronic obstructive pulmonary disease with acute lower respiratory infection: Secondary | ICD-10-CM | POA: Diagnosis present

## 2023-07-26 DIAGNOSIS — R7303 Prediabetes: Secondary | ICD-10-CM | POA: Diagnosis present

## 2023-07-26 DIAGNOSIS — F339 Major depressive disorder, recurrent, unspecified: Secondary | ICD-10-CM | POA: Diagnosis present

## 2023-07-26 DIAGNOSIS — Z823 Family history of stroke: Secondary | ICD-10-CM

## 2023-07-26 DIAGNOSIS — Z8249 Family history of ischemic heart disease and other diseases of the circulatory system: Secondary | ICD-10-CM

## 2023-07-26 DIAGNOSIS — F1190 Opioid use, unspecified, uncomplicated: Secondary | ICD-10-CM | POA: Diagnosis not present

## 2023-07-26 DIAGNOSIS — R6520 Severe sepsis without septic shock: Secondary | ICD-10-CM | POA: Diagnosis present

## 2023-07-26 DIAGNOSIS — Z833 Family history of diabetes mellitus: Secondary | ICD-10-CM

## 2023-07-26 DIAGNOSIS — F411 Generalized anxiety disorder: Secondary | ICD-10-CM | POA: Diagnosis present

## 2023-07-26 DIAGNOSIS — E876 Hypokalemia: Secondary | ICD-10-CM | POA: Diagnosis present

## 2023-07-26 DIAGNOSIS — D72829 Elevated white blood cell count, unspecified: Secondary | ICD-10-CM | POA: Diagnosis present

## 2023-07-26 DIAGNOSIS — J189 Pneumonia, unspecified organism: Secondary | ICD-10-CM | POA: Diagnosis present

## 2023-07-26 DIAGNOSIS — Z818 Family history of other mental and behavioral disorders: Secondary | ICD-10-CM

## 2023-07-26 DIAGNOSIS — Z8349 Family history of other endocrine, nutritional and metabolic diseases: Secondary | ICD-10-CM

## 2023-07-26 DIAGNOSIS — R5381 Other malaise: Secondary | ICD-10-CM | POA: Diagnosis present

## 2023-07-26 DIAGNOSIS — Z716 Tobacco abuse counseling: Secondary | ICD-10-CM

## 2023-07-26 DIAGNOSIS — J441 Chronic obstructive pulmonary disease with (acute) exacerbation: Secondary | ICD-10-CM | POA: Diagnosis present

## 2023-07-26 DIAGNOSIS — Z820 Family history of epilepsy and other diseases of the nervous system: Secondary | ICD-10-CM

## 2023-07-26 LAB — RAPID URINE DRUG SCREEN, HOSP PERFORMED
Amphetamines: NOT DETECTED
Barbiturates: NOT DETECTED
Benzodiazepines: POSITIVE — AB
Cocaine: NOT DETECTED
Opiates: POSITIVE — AB
Tetrahydrocannabinol: NOT DETECTED

## 2023-07-26 LAB — CBC WITH DIFFERENTIAL/PLATELET
Abs Immature Granulocytes: 0.06 10*3/uL (ref 0.00–0.07)
Abs Immature Granulocytes: 0.09 10*3/uL — ABNORMAL HIGH (ref 0.00–0.07)
Basophils Absolute: 0 10*3/uL (ref 0.0–0.1)
Basophils Absolute: 0 10*3/uL (ref 0.0–0.1)
Basophils Relative: 0 %
Basophils Relative: 0 %
Eosinophils Absolute: 0.2 10*3/uL (ref 0.0–0.5)
Eosinophils Absolute: 0 10*3/uL (ref 0.0–0.5)
Eosinophils Relative: 1 %
Eosinophils Relative: 0 %
HCT: 33.2 % — ABNORMAL LOW (ref 36.0–46.0)
HCT: 33.6 % — ABNORMAL LOW (ref 36.0–46.0)
Hemoglobin: 10.7 g/dL — ABNORMAL LOW (ref 12.0–15.0)
Hemoglobin: 10.6 g/dL — ABNORMAL LOW (ref 12.0–15.0)
Immature Granulocytes: 0 %
Immature Granulocytes: 1 %
Lymphocytes Relative: 15 %
Lymphocytes Relative: 5 %
Lymphs Abs: 2.4 10*3/uL (ref 0.7–4.0)
Lymphs Abs: 0.7 10*3/uL (ref 0.7–4.0)
MCH: 27.8 pg (ref 26.0–34.0)
MCH: 27.7 pg (ref 26.0–34.0)
MCHC: 32.2 g/dL (ref 30.0–36.0)
MCHC: 31.5 g/dL (ref 30.0–36.0)
MCV: 86.2 fL (ref 80.0–100.0)
MCV: 88 fL (ref 80.0–100.0)
Monocytes Absolute: 1.5 10*3/uL — ABNORMAL HIGH (ref 0.1–1.0)
Monocytes Absolute: 0.4 10*3/uL (ref 0.1–1.0)
Monocytes Relative: 10 %
Monocytes Relative: 3 %
Neutro Abs: 11.3 10*3/uL — ABNORMAL HIGH (ref 1.7–7.7)
Neutro Abs: 13.2 10*3/uL — ABNORMAL HIGH (ref 1.7–7.7)
Neutrophils Relative %: 74 %
Neutrophils Relative %: 91 %
Platelets: 497 10*3/uL — ABNORMAL HIGH (ref 150–400)
Platelets: 433 10*3/uL — ABNORMAL HIGH (ref 150–400)
RBC: 3.85 MIL/uL — ABNORMAL LOW (ref 3.87–5.11)
RBC: 3.82 MIL/uL — ABNORMAL LOW (ref 3.87–5.11)
RDW: 15.5 % (ref 11.5–15.5)
RDW: 15.4 % (ref 11.5–15.5)
WBC: 15.5 10*3/uL — ABNORMAL HIGH (ref 4.0–10.5)
WBC: 14.5 10*3/uL — ABNORMAL HIGH (ref 4.0–10.5)
nRBC: 0 % (ref 0.0–0.2)
nRBC: 0 % (ref 0.0–0.2)

## 2023-07-26 LAB — MRSA NEXT GEN BY PCR, NASAL: MRSA by PCR Next Gen: NOT DETECTED

## 2023-07-26 LAB — COMPREHENSIVE METABOLIC PANEL
ALT: 20 U/L (ref 0–44)
AST: 9 U/L — ABNORMAL LOW (ref 15–41)
Albumin: 2.8 g/dL — ABNORMAL LOW (ref 3.5–5.0)
Alkaline Phosphatase: 87 U/L (ref 38–126)
Anion gap: 8 (ref 5–15)
BUN: 8 mg/dL (ref 8–23)
CO2: 27 mmol/L (ref 22–32)
Calcium: 7.9 mg/dL — ABNORMAL LOW (ref 8.9–10.3)
Chloride: 100 mmol/L (ref 98–111)
Creatinine, Ser: 0.46 mg/dL (ref 0.44–1.00)
GFR, Estimated: >60 mL/min (ref >60)
Glucose, Bld: 271 mg/dL — ABNORMAL HIGH (ref 70–99)
Potassium: 2.9 mmol/L — ABNORMAL LOW (ref 3.5–5.1)
Sodium: 135 mmol/L (ref 135–145)
Total Bilirubin: 0.4 mg/dL (ref 0.3–1.2)
Total Protein: 6.1 g/dL — ABNORMAL LOW (ref 6.5–8.1)

## 2023-07-26 LAB — BASIC METABOLIC PANEL
Anion gap: 9 (ref 5–15)
BUN: 9 mg/dL (ref 8–23)
CO2: 29 mmol/L (ref 22–32)
Calcium: 7.9 mg/dL — ABNORMAL LOW (ref 8.9–10.3)
Chloride: 96 mmol/L — ABNORMAL LOW (ref 98–111)
Creatinine, Ser: 0.46 mg/dL (ref 0.44–1.00)
GFR, Estimated: >60 mL/min (ref >60)
Glucose, Bld: 207 mg/dL — ABNORMAL HIGH (ref 70–99)
Potassium: 2.9 mmol/L — ABNORMAL LOW (ref 3.5–5.1)
Sodium: 134 mmol/L — ABNORMAL LOW (ref 135–145)

## 2023-07-26 LAB — HEMOGLOBIN A1C
Hgb A1c MFr Bld: 6 % — ABNORMAL HIGH (ref 4.8–5.6)
Mean Plasma Glucose: 126 mg/dL

## 2023-07-26 LAB — BLOOD GAS, VENOUS
Acid-Base Excess: 7.5 mmol/L — ABNORMAL HIGH (ref 0.0–2.0)
Bicarbonate: 33.9 mmol/L — ABNORMAL HIGH (ref 20.0–28.0)
Drawn by: 58025
O2 Saturation: 84.4 %
Patient temperature: 36.9
pCO2, Ven: 56 mmHg (ref 44–60)
pH, Ven: 7.39 (ref 7.25–7.43)
pO2, Ven: 49 mmHg — ABNORMAL HIGH (ref 32–45)

## 2023-07-26 LAB — STREP PNEUMONIAE URINARY ANTIGEN: Strep Pneumo Urinary Antigen: NEGATIVE

## 2023-07-26 LAB — EXPECTORATED SPUTUM ASSESSMENT W GRAM STAIN, RFLX TO RESP C

## 2023-07-26 LAB — MAGNESIUM: Magnesium: 2.2 mg/dL (ref 1.7–2.4)

## 2023-07-26 LAB — PROCALCITONIN: Procalcitonin: <0.1 ng/mL

## 2023-07-26 LAB — RESP PANEL BY RT-PCR (RSV, FLU A&B, COVID)  RVPGX2
Influenza A by PCR: NEGATIVE
Influenza B by PCR: NEGATIVE
Resp Syncytial Virus by PCR: NEGATIVE
SARS Coronavirus 2 by RT PCR: NEGATIVE

## 2023-07-26 LAB — LACTIC ACID, PLASMA: Lactic Acid, Venous: 1.2 mmol/L (ref 0.5–1.9)

## 2023-07-26 LAB — PREALBUMIN: Prealbumin: 14 mg/dL — ABNORMAL LOW (ref 18–38)

## 2023-07-26 MED ORDER — DEXTROMETHORPHAN POLISTIREX ER 30 MG/5ML PO SUER: 30.0000 mg | Freq: Two times a day (BID) | ORAL | Status: DC | PRN

## 2023-07-26 MED ORDER — MELATONIN 3 MG PO TABS
6.0000 mg | ORAL_TABLET | Freq: Every day | ORAL | Status: DC
  Administered 2023-07-26 – 2023-07-30 (×5): 6 mg via ORAL
  Filled 2023-07-26 (×5): qty 2

## 2023-07-26 MED ORDER — VANCOMYCIN HCL IN DEXTROSE 1-5 GM/200ML-% IV SOLN: 1000.0000 mg | INTRAVENOUS | Status: DC

## 2023-07-26 MED ORDER — ALBUTEROL SULFATE (2.5 MG/3ML) 0.083% IN NEBU
INHALATION_SOLUTION | RESPIRATORY_TRACT | Status: AC
  Administered 2023-07-26: 5 mg
  Filled 2023-07-26: qty 6

## 2023-07-26 MED ORDER — HEPARIN SODIUM (PORCINE) 5000 UNIT/ML IJ SOLN
5000.0000 [IU] | Freq: Three times a day (TID) | INTRAMUSCULAR | Status: DC
  Administered 2023-07-26 – 2023-07-31 (×14): 5000 [IU] via SUBCUTANEOUS
  Filled 2023-07-26 (×15): qty 1

## 2023-07-26 MED ORDER — GUAIFENESIN ER 600 MG PO TB12
600.0000 mg | ORAL_TABLET | Freq: Two times a day (BID) | ORAL | Status: AC
  Administered 2023-07-26 – 2023-07-30 (×10): 600 mg via ORAL
  Filled 2023-07-26 (×10): qty 1

## 2023-07-26 MED ORDER — LACTATED RINGERS IV SOLN: INTRAVENOUS | Status: DC

## 2023-07-26 MED ORDER — BENZONATATE 100 MG PO CAPS: 100.0000 mg | ORAL_CAPSULE | Freq: Three times a day (TID) | ORAL | Status: DC | PRN

## 2023-07-26 MED ORDER — LACTATED RINGERS IV BOLUS (SEPSIS)
1000.0000 mL | Freq: Once | INTRAVENOUS | Status: AC
  Administered 2023-07-26: 1000 mL via INTRAVENOUS

## 2023-07-26 MED ORDER — CITALOPRAM HYDROBROMIDE 20 MG PO TABS
20.0000 mg | ORAL_TABLET | Freq: Every day | ORAL | Status: DC
  Administered 2023-07-26 – 2023-07-31 (×6): 20 mg via ORAL
  Filled 2023-07-26 (×6): qty 1

## 2023-07-26 MED ORDER — ALBUTEROL SULFATE (2.5 MG/3ML) 0.083% IN NEBU
INHALATION_SOLUTION | RESPIRATORY_TRACT | Status: AC
  Administered 2023-07-26: 2.5 mg
  Filled 2023-07-26: qty 6

## 2023-07-26 MED ORDER — ONDANSETRON HCL 4 MG PO TABS: 4.0000 mg | ORAL_TABLET | Freq: Four times a day (QID) | ORAL | Status: DC | PRN

## 2023-07-26 MED ORDER — OXYCODONE HCL 5 MG PO TABS
5.0000 mg | ORAL_TABLET | ORAL | Status: DC | PRN
  Administered 2023-07-26 – 2023-07-31 (×18): 5 mg via ORAL
  Filled 2023-07-26 (×19): qty 1

## 2023-07-26 MED ORDER — MORPHINE SULFATE (PF) 2 MG/ML IV SOLN
2.0000 mg | INTRAVENOUS | Status: DC | PRN
  Administered 2023-07-26: 2 mg via INTRAVENOUS
  Filled 2023-07-26: qty 1

## 2023-07-26 MED ORDER — CHLORHEXIDINE GLUCONATE CLOTH 2 % EX PADS
6.0000 | MEDICATED_PAD | Freq: Every day | CUTANEOUS | Status: DC
  Administered 2023-07-27 – 2023-07-31 (×5): 6 via TOPICAL

## 2023-07-26 MED ORDER — ALBUTEROL SULFATE (2.5 MG/3ML) 0.083% IN NEBU: 2.5000 mg | INHALATION_SOLUTION | RESPIRATORY_TRACT | Status: DC | PRN

## 2023-07-26 MED ORDER — POTASSIUM CHLORIDE 20 MEQ PO PACK
20.0000 meq | PACK | Freq: Once | ORAL | Status: AC
  Administered 2023-07-26: 20 meq via ORAL
  Filled 2023-07-26: qty 1

## 2023-07-26 MED ORDER — ACETAMINOPHEN 650 MG RE SUPP: 650.0000 mg | Freq: Four times a day (QID) | RECTAL | Status: DC | PRN

## 2023-07-26 MED ORDER — MORPHINE SULFATE (PF) 2 MG/ML IV SOLN
1.0000 mg | INTRAVENOUS | Status: DC | PRN
  Administered 2023-07-26 – 2023-07-30 (×15): 1 mg via INTRAVENOUS
  Filled 2023-07-26 (×15): qty 1

## 2023-07-26 MED ORDER — GUAIFENESIN 100 MG/5ML PO LIQD: 200.0000 mg | Freq: Three times a day (TID) | ORAL | Status: DC | PRN

## 2023-07-26 MED ORDER — NICOTINE 14 MG/24HR TD PT24
14.0000 mg | MEDICATED_PATCH | Freq: Every day | TRANSDERMAL | Status: DC
  Administered 2023-07-26 – 2023-07-31 (×6): 14 mg via TRANSDERMAL
  Filled 2023-07-26 (×6): qty 1

## 2023-07-26 MED ORDER — ACETAMINOPHEN 325 MG PO TABS
650.0000 mg | ORAL_TABLET | Freq: Four times a day (QID) | ORAL | Status: DC | PRN
  Administered 2023-07-31: 650 mg via ORAL
  Filled 2023-07-26: qty 2

## 2023-07-26 MED ORDER — LABETALOL HCL 5 MG/ML IV SOLN
5.0000 mg | INTRAVENOUS | Status: DC | PRN
  Administered 2023-07-27 – 2023-07-29 (×3): 5 mg via INTRAVENOUS
  Filled 2023-07-26 (×3): qty 4

## 2023-07-26 MED ORDER — POTASSIUM CHLORIDE 10 MEQ/100ML IV SOLN
10.0000 meq | INTRAVENOUS | Status: AC
  Administered 2023-07-26 (×4): 10 meq via INTRAVENOUS
  Filled 2023-07-26 (×4): qty 100

## 2023-07-26 MED ORDER — MAGNESIUM SULFATE 2 GM/50ML IV SOLN
2.0000 g | Freq: Once | INTRAVENOUS | Status: AC
  Administered 2023-07-26: 2 g via INTRAVENOUS
  Filled 2023-07-26: qty 50

## 2023-07-26 MED ORDER — METHYLPREDNISOLONE SODIUM SUCC 125 MG IJ SOLR
125.0000 mg | Freq: Two times a day (BID) | INTRAMUSCULAR | Status: AC
  Administered 2023-07-26 (×2): 125 mg via INTRAVENOUS
  Filled 2023-07-26 (×2): qty 2

## 2023-07-26 MED ORDER — FLUTICASONE FUROATE-VILANTEROL 100-25 MCG/ACT IN AEPB
1.0000 | INHALATION_SPRAY | Freq: Every day | RESPIRATORY_TRACT | Status: DC
  Administered 2023-07-26 – 2023-07-28 (×2): 1 via RESPIRATORY_TRACT
  Filled 2023-07-26 (×2): qty 28

## 2023-07-26 MED ORDER — SODIUM CHLORIDE 0.9 % IV SOLN
2.0000 g | Freq: Once | INTRAVENOUS | Status: AC
  Administered 2023-07-26: 2 g via INTRAVENOUS
  Filled 2023-07-26: qty 12.5

## 2023-07-26 MED ORDER — VANCOMYCIN HCL IN DEXTROSE 1-5 GM/200ML-% IV SOLN
1000.0000 mg | Freq: Once | INTRAVENOUS | Status: AC
  Administered 2023-07-26: 1000 mg via INTRAVENOUS
  Filled 2023-07-26: qty 200

## 2023-07-26 MED ORDER — IPRATROPIUM-ALBUTEROL 0.5-2.5 (3) MG/3ML IN SOLN
3.0000 mL | Freq: Once | RESPIRATORY_TRACT | Status: AC
  Administered 2023-07-26: 3 mL via RESPIRATORY_TRACT
  Filled 2023-07-26: qty 3

## 2023-07-26 MED ORDER — METHYLPREDNISOLONE SODIUM SUCC 125 MG IJ SOLR
125.0000 mg | Freq: Once | INTRAMUSCULAR | Status: AC
  Administered 2023-07-26: 125 mg via INTRAVENOUS
  Filled 2023-07-26: qty 2

## 2023-07-26 MED ORDER — SODIUM CHLORIDE 0.9 % IV SOLN
2.0000 g | Freq: Three times a day (TID) | INTRAVENOUS | Status: AC
  Administered 2023-07-26 – 2023-07-30 (×12): 2 g via INTRAVENOUS
  Filled 2023-07-26 (×12): qty 12.5

## 2023-07-26 MED ORDER — PREDNISONE 20 MG PO TABS: 40.0000 mg | ORAL_TABLET | Freq: Every day | ORAL | Status: DC

## 2023-07-26 MED ORDER — METHYLPREDNISOLONE SODIUM SUCC 40 MG IJ SOLR
40.0000 mg | Freq: Two times a day (BID) | INTRAMUSCULAR | Status: DC
  Administered 2023-07-27: 40 mg via INTRAVENOUS
  Filled 2023-07-26: qty 1

## 2023-07-26 MED ORDER — HYDROXYZINE HCL 25 MG PO TABS
50.0000 mg | ORAL_TABLET | Freq: Three times a day (TID) | ORAL | Status: DC | PRN
  Administered 2023-07-27 – 2023-07-31 (×4): 50 mg via ORAL
  Filled 2023-07-26 (×4): qty 2

## 2023-07-26 MED ORDER — IPRATROPIUM BROMIDE 0.02 % IN SOLN
RESPIRATORY_TRACT | Status: AC
  Administered 2023-07-26: 0.5 mg
  Filled 2023-07-26: qty 2.5

## 2023-07-26 MED ORDER — ALBUTEROL SULFATE (2.5 MG/3ML) 0.083% IN NEBU
INHALATION_SOLUTION | RESPIRATORY_TRACT | Status: AC
  Administered 2023-07-26: 2.5 mg
  Filled 2023-07-26: qty 3

## 2023-07-26 MED ORDER — IPRATROPIUM-ALBUTEROL 0.5-2.5 (3) MG/3ML IN SOLN
3.0000 mL | Freq: Four times a day (QID) | RESPIRATORY_TRACT | Status: AC
  Administered 2023-07-26 – 2023-07-30 (×19): 3 mL via RESPIRATORY_TRACT
  Filled 2023-07-26 (×19): qty 3

## 2023-07-26 MED ORDER — ONDANSETRON HCL 4 MG/2ML IJ SOLN: 4.0000 mg | Freq: Four times a day (QID) | INTRAMUSCULAR | Status: DC | PRN

## 2023-07-26 NOTE — ED Notes (Signed)
Pt states her pain is at a 7-8 and wants to try a different medication so she is going to receive the Oxycodone this time

## 2023-07-26 NOTE — ED Notes (Signed)
Pts bed had a full changed due to purewick malfunction. Pt in a clean gown and bed. Pt resting at this time with call light in reach.

## 2023-07-26 NOTE — Progress Notes (Signed)
Got on patient for moving mask and taking sips of soda while on Bipap.  Moved beverages out of patient's reach and explained once again why it is not good to be eating or drinking on Bipap.

## 2023-07-26 NOTE — ED Provider Notes (Signed)
[place] EMERGENCY DEPARTMENT AT [facility]  Provider Note  CSN: [id number] Arrival date & time: 07/26/23 9485  History Chief Complaint  Patient presents with   Shortness of Breath    Casey [name] is a 62 y.o. female with history of COPD on home oxygen has had 2 days of increasing SOB, not improved with home inhalers. She does not have a nebulizer. She has had 2 recent admissions for similar including 8/16 for multifocal PNA, eventually intubated and transferred to [facility] ICU, discharged home 9/2, readmitted 9/7 for hypoxia again and discharged on 9/9. She was apparently was doing well until 2 days ago. She has stopped using illicit drugs (previously a polysubstance abuser including fentanyl) and has cut back on her tobacco use. She denies fever at home.    Home Medications Prior to Admission medications   Medication Sig Start Date End Date Taking? Authorizing Provider  albuterol (VENTOLIN HFA) 108 (90 Base) MCG/ACT inhaler Inhale 2 puffs into the lungs every 6 (six) hours as needed for wheezing or shortness of breath. 07/12/23   Ghimire, Werner Lean, MD  benzonatate (TESSALON PERLES) 100 MG capsule Take 1 capsule (100 mg total) by mouth 3 (three) times daily as needed for cough. 07/12/23 07/11/24  Ghimire, Werner Lean, MD  cholecalciferol (VITAMIN D3) 25 MCG (1000 UNIT) tablet Take 1,000 Units by mouth daily.    [provider]  citalopram (CELEXA) 20 MG tablet TAKE 1 TABLET BY MOUTH EVERY DAY 03/01/23   Rakes, Doralee Albino, FNP  fluticasone furoate-vilanterol (BREO ELLIPTA) 100-25 MCG/ACT AEPB Inhale 1 puff into the lungs daily. 07/20/23   Sherryll Burger, Pratik D, DO  guaifenesin (ROBITUSSIN) 100 MG/5ML syrup Take 200 mg by mouth 3 (three) times daily as needed for cough.    [provider]  hydrOXYzine (ATARAX) 50 MG tablet Take 1 tablet (50 mg total) by mouth 3 (three) times daily as needed. 07/12/23   Ghimire, Werner Lean, MD  ibuprofen (ADVIL) 200 MG tablet Take 200 mg by mouth every 6  (six) hours as needed for fever, headache or mild pain.    [provider]  melatonin 10 MG TABS Take 10 mg by mouth at bedtime. Patient not taking: Reported on 07/17/2023 07/12/23   Maretta Bees, MD     Allergies    Patient has no known allergies.   Review of Systems   Review of Systems Please see HPI for pertinent positives and negatives  Physical Exam BP 97/65   Pulse 95   Temp 98.4 F (36.9 C)   Resp (!) 24   Ht 5\' 5"  (1.651 m)   Wt 58.1 kg   LMP  (LMP Unknown)   SpO2 95%   BMI 21.30 kg/m   Physical Exam Vitals and nursing note reviewed.  Constitutional:      Appearance: Normal appearance.  HENT:     Head: Normocephalic and atraumatic.     Nose: Nose normal.     Mouth/Throat:     Mouth: Mucous membranes are moist.  Eyes:     Extraocular Movements: Extraocular movements intact.     Conjunctiva/sclera: Conjunctivae normal.  Cardiovascular:     Rate and Rhythm: Normal rate.  Pulmonary:     Effort: Tachypnea and accessory muscle usage present.     Breath sounds: Wheezing present.  Abdominal:     General: Abdomen is flat.     Palpations: Abdomen is soft.     Tenderness: There is no abdominal tenderness.  Musculoskeletal:  General: No swelling. Normal range of motion.     Cervical back: Neck supple.     Right lower leg: No edema.     Left lower leg: No edema.  Skin:    General: Skin is warm and dry.  Neurological:     General: No focal deficit present.     Mental Status: She is alert.  Psychiatric:        Mood and Affect: Mood normal.     ED Results / Procedures / Treatments   EKG EKG Interpretation Date/Time:  Monday July 26 2023 02:34:04 EDT Ventricular Rate:  115 PR Interval:  175 QRS Duration:  88 QT Interval:  350 QTC Calculation: 485 R Axis:   -22  Text Interpretation: Sinus tachycardia Multiple premature complexes, vent & supraven Baseline wander Aberrant conduction of SV complex(es) Consider left ventricular  hypertrophy Nonspecific T abnrm, anterolateral leads No significant change since last tracing Confirmed by Susy Frizzle [phone]) on 07/26/2023 2:48:21 AM  Procedures .Critical Care  Performed by: Pollyann Savoy, MD Authorized by: Pollyann Savoy, MD   Critical care provider statement:    Critical care time (minutes):  80   Critical care time was exclusive of:  Separately billable procedures and treating other patients   Critical care was necessary to treat or prevent imminent or life-threatening deterioration of the following conditions:  Sepsis and respiratory failure   Critical care was time spent personally by me on the following activities:  Development of treatment plan with patient or surrogate, discussions with consultants, evaluation of patient's response to treatment, examination of patient, ordering and review of laboratory studies, ordering and review of radiographic studies, ordering and performing treatments and interventions, pulse oximetry, re-evaluation of patient's condition and review of old charts   Medications Ordered in the ED Medications  lactated ringers infusion ( Intravenous New Bag/Given 07/26/23 0403)  vancomycin (VANCOCIN) IVPB 1000 mg/200 mL premix (1,000 mg Intravenous New Bag/Given 07/26/23 0338)  potassium chloride 10 mEq in 100 mL IVPB (10 mEq Intravenous New Bag/Given 07/26/23 0403)  ipratropium-albuterol (DUONEB) 0.5-2.5 (3) MG/3ML nebulizer solution 3 mL (3 mLs Nebulization Given 07/26/23 0316)  magnesium sulfate IVPB 2 g 50 mL (0 g Intravenous Stopped 07/26/23 0343)  methylPREDNISolone sodium succinate (SOLU-MEDROL) 125 mg/2 mL injection 125 mg (125 mg Intravenous Given 07/26/23 0244)  lactated ringers bolus 1,000 mL (1,000 mLs Intravenous New Bag/Given 07/26/23 0308)  ceFEPIme (MAXIPIME) 2 g in sodium chloride 0.9 % 100 mL IVPB (0 g Intravenous Stopped 07/26/23 0335)  albuterol (PROVENTIL) (2.5 MG/3ML) 0.083% nebulizer solution (2.5 mg  Given 07/26/23  0315)  albuterol (PROVENTIL) (2.5 MG/3ML) 0.083% nebulizer solution (5 mg  Given 07/26/23 0342)  ipratropium (ATROVENT) 0.02 % nebulizer solution (0.5 mg  Given 07/26/23 0343)  albuterol (PROVENTIL) (2.5 MG/3ML) 0.083% nebulizer solution (2.5 mg  Given 07/26/23 0343)    Initial Impression and Plan  Patient here with recurrence of hypoxic respiratory failure in setting of recent COPD exacerbations and multifocal pneumonia. Will check labs, EKG and CXR. Begin meds for COPD exacerbation. Not febrile here.   ED Course   Clinical Course as of 07/26/23 0424  Mon Jul 26, 2023  6063 I personally viewed the images from radiology studies and agree with radiologist interpretation: Xray is concerning for increased infiltrates. Will add cultures, lactic acid and begin Abx for presumed sepsis. She is tachycardic and tachypneic. Borderline BP on arrival has improved. Oxygen down to 6L with stable SpO2 [CS]  0317 CBC with leukocytosis.  [  CS]  0339 Lactic acid is normal. BMP with hypokalemia, will begin IV repletion. VBG without significant hypercapnia.  [CS]  [id number] Patient resting more comfortably now. Will plan admission for worsening hypoxia and return of infiltrates. Hospitalist paged.  [CS]  0419 Covid/Flu/RSV swab is negative.  [CS]  0422 Spoke with Dr. Carren Rang, Hospitalist, who will evaluate for admission.  [CS]    Clinical Course User Index [CS] Pollyann Savoy, MD     MDM Rules/Calculators/A&P Medical Decision Making Problems Addressed: Acute and chronic respiratory failure with hypoxia [facility]): chronic illness or injury with exacerbation, progression, or side effects of treatment COPD with acute exacerbation [facility]): chronic illness or injury with exacerbation, progression, or side effects of treatment HCAP (healthcare-associated pneumonia): acute illness or injury Hypokalemia: acute illness or injury  Amount and/or Complexity of Data Reviewed Labs: ordered. Decision-making details  documented in ED Course. Radiology: ordered and independent interpretation performed. Decision-making details documented in ED Course. ECG/medicine tests: ordered and independent interpretation performed. Decision-making details documented in ED Course.  Risk Prescription drug management. Decision regarding hospitalization.     Final Clinical Impression(s) / ED Diagnoses Final diagnoses:  HCAP (healthcare-associated pneumonia)  Acute and chronic respiratory failure with hypoxia (HCC)  COPD with acute exacerbation (HCC)  Hypokalemia    Rx / DC Orders ED Discharge Orders     None        Pollyann Savoy, MD 07/26/23 0425

## 2023-07-26 NOTE — ED Triage Notes (Signed)
Arrives [facility] from home after ongoing shob x 2 days. Recent dx/ admission for pneumonia.   Compliant on home 2L [place].   Reported by paramedics that pt found at home @54 % on 2L [place] and had improvement to ^90's on NRB.

## 2023-07-26 NOTE — Hospital Course (Signed)
62 year old female with past medical history significant for chronic pain,  chronic respiratory failure, COPD, chronic tobacco use, history of taking opioids and benzodiazepines that have not been prescribed.  History of cocaine, THC use.  Pt presents today after several recent hospitalizations.  She was admitted 8/17 thru 9/2 with encephalopathy from polysubstance abuse and aspiration pneumonia and subsequently decompensated requiring intubation and transferred to [facility].  She was readmitted on 9/7 with acute respiratory failure with hypoxia, COPD exacerbation and subsequently discharged 9/9 on 2L/min supplemental oxygen.   She returns to ED 9/16 from home complaining of increasing shortness of breath not improving with home inhalers. She apparently does not have a nebulizer at home.  She had increased her oxygen to 4L/min and remained very short of breath with cough, chest congestion, fever and malaise.  She reports that she has recently stopped using recreational drugs.  She is still smoking but has apparently cut back on cigarettes.   She required NRB oxygenation but then weaned to 6L/min [place].  Her CXR was positive for multifocal areas of pneumonia.  Covid/Flu/RSV testing was negative.  She is being admitted to stepdown ICU due to known high risk for acute decompensation for acute on chronic respiratory failure secondary to multifocal pneumonia and COPD exacerbation.

## 2023-07-26 NOTE — ED Notes (Signed)
Pt used bedpan, cleaned up and resting in bed at this time with call light in reach. Gave two warm blankets.

## 2023-07-26 NOTE — Progress Notes (Signed)
Moved patient from ED 1 to ICU 3 without incident.  Will alert ICU RT of move, RN at bedside.

## 2023-07-26 NOTE — Progress Notes (Addendum)
Pharmacy Antibiotic Note  Casey [name] is a 62 y.o. female admitted on 07/26/2023 with pneumonia.  Pharmacy has been consulted for Vancomycin and Cefepime dosing.  Pt with multiple recent admissions 8/17-9/2 for asp pna and overdose then again 9/7-9/9 for acute COPD exacerbation.  Plan: Cefepime 2gm IV q8h Vancomycin 1000 mg IV Q 24 hrs. Goal AUC 400-550. Expected AUC: 416, SCr used: 0.8 Will f/u renal function, micro data, and pt's clinical condition Vanc levels prn MRSA PCR ordered in hopes to de-escalate therapy   Height: 5\' 5"  (165.1 cm) Weight: 58.1 kg (128 lb) IBW/kg (Calculated) : 57  Temp (24hrs), Avg:98.4 F (36.9 C), Min:98.4 F (36.9 C), Max:98.4 F (36.9 C)  Recent Labs  Lab 07/26/23 0244  WBC 15.5*  CREATININE 0.46  LATICACIDVEN 1.2    Estimated Creatinine Clearance: 63.9 mL/min (by C-G formula based on SCr of 0.46 mg/dL).    No Known Allergies  Antimicrobials this admission: 9/16 Vanc >>  9/16 Cefepime >>   Microbiology results: 9/16 BCx:   Thank you for allowing pharmacy to be a part of this patient's care.  Christoper Fabian, PharmD, BCPS Please see amion for complete clinical pharmacist phone list 07/26/2023 4:38 AM

## 2023-07-26 NOTE — Sepsis Progress Note (Signed)
Elink following code sepsis °

## 2023-07-26 NOTE — Progress Notes (Signed)
ED Pharmacy Antibiotic Sign Off An antibiotic consult was received from an ED provider for Vancomycin and Cefepime per pharmacy dosing for PNA. A chart review was completed to assess appropriateness.   The following one time order(s) were placed:  Vancomycin 1gm and Cefepime 2gm   Further antibiotic and/or antibiotic pharmacy consults should be ordered by the admitting provider if indicated.   Thank you for allowing pharmacy to be a part of this patient's care.   Christoper Fabian, PharmD, BCPS Please see amion for complete clinical pharmacist phone list 07/26/23 3:07 AM

## 2023-07-26 NOTE — H&P (Signed)
History and Physical  [facility]  Casey [name]:[id number] DOB: Jan 23, 1961 DOA: 07/26/2023  PCP: Patient, No Pcp Per  Patient coming from: Home by EMS Level of care: Stepdown  I have personally briefly reviewed patient's old medical records in [facility] Link  Chief Complaint: SOB   HPI: Casey [name] is a 62 year old female with past medical history significant for chronic pain,  chronic respiratory failure, COPD, chronic tobacco use, history of taking opioids and benzodiazepines that have not been prescribed.  History of cocaine, THC use.  Pt presents today after several recent hospitalizations.  She was admitted 8/17 thru 9/2 with encephalopathy from polysubstance abuse and aspiration pneumonia and subsequently decompensated requiring intubation and transferred to [facility].  She was readmitted on 9/7 with acute respiratory failure with hypoxia, COPD exacerbation and subsequently discharged 9/9 on 2L/min supplemental oxygen.   She returns to ED 9/16 from home complaining of increasing shortness of breath not improving with home inhalers. She apparently does not have a nebulizer at home.  She had increased her oxygen to 4L/min and remained very short of breath with cough, chest congestion, fever and malaise.  She reports that she has recently stopped using recreational drugs.  She is still smoking but has apparently cut back on cigarettes.   She required NRB oxygenation but then weaned to 6L/min [place].  Her CXR was positive for multifocal areas of pneumonia.  Covid/Flu/RSV testing was negative.  She is being admitted to stepdown ICU due to known high risk for acute decompensation for acute on chronic respiratory failure secondary to multifocal pneumonia and COPD exacerbation.    Past Medical History:  Diagnosis Date   Anxiety    Chronic back pain    Depression    History of panic attacks 09/11/2014   Medical history non-contributory    Pneumonia    Polysubstance abuse (HCC)     History of taking non-prescribed opiates, BZDs; also +cocaine and THC   Ruptured disc, thoracic    Suicide attempt (HCC) 2002    Past Surgical History:  Procedure Laterality Date   CESAREAN SECTION     CLAVICLE SURGERY     REVERSE SHOULDER ARTHROPLASTY Left 01/01/2020   Procedure: LEFT REVERSE SHOULDER ARTHROPLASTY;  Surgeon: Cammy Copa, MD;  Location: [facility] OR;  Service: Orthopedics;  Laterality: Left;   TUBAL LIGATION       reports that she has been smoking cigarettes. She has never used smokeless tobacco. She reports current alcohol use of about 1.0 standard drink of alcohol per week. She reports that she does not currently use drugs after having used the following drugs: "Crack" cocaine and Benzodiazepines.  No Known Allergies  Family History  Problem Relation Age of Onset   Diabetes Mother    Pulmonary fibrosis Mother    Heart attack Father    Parkinson's disease Sister    Bipolar disorder Sister    Thyroid disease Sister    Heart attack Brother    Other Son        vascular neucrosis   Pulmonary fibrosis Maternal Grandmother    Diabetes Maternal Grandfather    Stroke Maternal Grandfather    Pneumonia Paternal Grandfather    Cancer Sister        melonoma   Other Brother        back problems, nerve problems, soft bones    Prior to Admission medications   Medication Sig Start Date End Date Taking? Authorizing Provider  albuterol (VENTOLIN HFA)  108 (90 Base) MCG/ACT inhaler Inhale 2 puffs into the lungs every 6 (six) hours as needed for wheezing or shortness of breath. 07/12/23   Ghimire, Werner Lean, MD  benzonatate (TESSALON PERLES) 100 MG capsule Take 1 capsule (100 mg total) by mouth 3 (three) times daily as needed for cough. 07/12/23 07/11/24  Ghimire, Werner Lean, MD  cholecalciferol (VITAMIN D3) 25 MCG (1000 UNIT) tablet Take 1,000 Units by mouth daily.    [provider]  citalopram (CELEXA) 20 MG tablet TAKE 1 TABLET BY MOUTH EVERY DAY 03/01/23   Rakes, Doralee Albino, FNP  fluticasone furoate-vilanterol (BREO ELLIPTA) 100-25 MCG/ACT AEPB Inhale 1 puff into the lungs daily. 07/20/23   Sherryll Burger, Pratik D, DO  guaifenesin (ROBITUSSIN) 100 MG/5ML syrup Take 200 mg by mouth 3 (three) times daily as needed for cough.    [provider]  hydrOXYzine (ATARAX) 50 MG tablet Take 1 tablet (50 mg total) by mouth 3 (three) times daily as needed. 07/12/23   Ghimire, Werner Lean, MD  ibuprofen (ADVIL) 200 MG tablet Take 200 mg by mouth every 6 (six) hours as needed for fever, headache or mild pain.    [provider]  melatonin 10 MG TABS Take 10 mg by mouth at bedtime. Patient not taking: Reported on 07/17/2023 07/12/23   Maretta Bees, MD    Physical Exam: Vitals:   07/26/23 0845 07/26/23 0900 07/26/23 0930 07/26/23 0943  BP: (!) 154/83 (!) 164/89 (!) 150/78   Pulse: (!) 111 (!) 118 (!) 117   Resp: (!) 33 (!) 38 (!) 43   Temp:    97.6 F (36.4 C)  TempSrc:    Oral  SpO2: 92% 91% 94%   Weight:      Height:        Constitutional: appears acutely and chronically ill, speaking short sentences, tachypneic with loud wheezing heard; moderately distressed and uncomfortable.  Eyes: PERRL, lids and conjunctivae normal ENMT: Mucous membranes are pale. Posterior pharynx clear of any exudate or lesions.  Neck: normal, supple, no masses, no thyromegaly Respiratory: diffuse inspiratory/expiratory wheezing bilateral with rales heard and tachypnea.   Cardiovascular: tachycardic rate; normal s1, s2 sounds, no murmurs / rubs / gallops. No extremity edema. 2+ pedal pulses. No carotid bruits.  Abdomen: no tenderness, no masses palpated. No hepatosplenomegaly. Bowel sounds positive.  Musculoskeletal: no clubbing / cyanosis. No joint deformity upper and lower extremities. Good ROM, no contractures. Normal muscle tone.  Skin: no rashes, lesions, ulcers. No induration Neurologic: CN 2-12 grossly intact. Sensation intact, DTR normal. Strength 5/5 in all 4.  Psychiatric:  Normal judgment and insight. Alert and oriented x 3. Normal mood.   Labs on Admission: I have personally reviewed following labs and imaging studies  CBC: Recent Labs  Lab 07/26/23 0244 07/26/23 0525  WBC 15.5* 14.5*  NEUTROABS 11.3* 13.2*  HGB 10.7* 10.6*  HCT 33.2* 33.6*  MCV 86.2 88.0  PLT 497* 433*   Basic Metabolic Panel: Recent Labs  Lab 07/26/23 0244 07/26/23 0525  NA 134* 135  K 2.9* 2.9*  CL 96* 100  CO2 29 27  GLUCOSE 207* 271*  BUN 9 8  CREATININE 0.46 0.46  CALCIUM 7.9* 7.9*  MG  --  2.2   GFR: Estimated Creatinine Clearance: 63.9 mL/min (by C-G formula based on SCr of 0.46 mg/dL). Liver Function Tests: Recent Labs  Lab 07/26/23 0525  AST 9*  ALT 20  ALKPHOS 87  BILITOT 0.4  PROT 6.1*  ALBUMIN 2.8*  No results for input(s): "LIPASE", "AMYLASE" in the last 168 hours. No results for input(s): "AMMONIA" in the last 168 hours. Coagulation Profile: No results for input(s): "INR", "PROTIME" in the last 168 hours. Cardiac Enzymes: No results for input(s): "CKTOTAL", "CKMB", "CKMBINDEX", "TROPONINI" in the last 168 hours. BNP (last 3 results) No results for input(s): "PROBNP" in the last 8760 hours. HbA1C: No results for input(s): "HGBA1C" in the last 72 hours. CBG: No results for input(s): "GLUCAP" in the last 168 hours. Lipid Profile: No results for input(s): "CHOL", "HDL", "LDLCALC", "TRIG", "CHOLHDL", "LDLDIRECT" in the last 72 hours. Thyroid Function Tests: No results for input(s): "TSH", "T4TOTAL", "FREET4", "T3FREE", "THYROIDAB" in the last 72 hours. Anemia Panel: No results for input(s): "VITAMINB12", "FOLATE", "FERRITIN", "TIBC", "IRON", "RETICCTPCT" in the last 72 hours. Urine analysis:    Component Value Date/Time   COLORURINE YELLOW 06/26/2023 0134   APPEARANCEUR CLEAR 06/26/2023 0134   LABSPEC 1.014 06/26/2023 0134   PHURINE 5.0 06/26/2023 0134   GLUCOSEU NEGATIVE 06/26/2023 0134   HGBUR SMALL (A) 06/26/2023 0134   BILIRUBINUR  NEGATIVE 06/26/2023 0134   KETONESUR NEGATIVE 06/26/2023 0134   PROTEINUR NEGATIVE 06/26/2023 0134   UROBILINOGEN 0.2 05/02/2009 1015   NITRITE NEGATIVE 06/26/2023 0134   LEUKOCYTESUR NEGATIVE 06/26/2023 0134    Radiological Exams on Admission: DG Chest Port 1 View  Result Date: 07/26/2023 CLINICAL DATA:  Cough, hypoxia, COPD EXAM: PORTABLE CHEST 1 VIEW COMPARISON:  Radiographs and CT from 07/17/2023 FINDINGS: Bilateral patchy airspace and interstitial opacities. Chronic interstitial coarsening. No pleural effusion or pneumothorax. Stable cardiomediastinal silhouette. Left reverse TSA. IMPRESSION: Patchy airspace and interstitial opacities, increased from 07/17/2023 and favoring multifocal pneumonia. Electronically Signed   By: Minerva Fester M.D.   On: 07/26/2023 02:50    EKG: Independently reviewed. Sinus tachycardia   Assessment/Plan Principal Problem:   Sepsis due to pneumonia [facility]) Active Problems:   Acute and chronic respiratory failure with hypoxia (HCC)   Malaise   History of panic attacks   Depression, recurrent (HCC)   GAD (generalized anxiety disorder)   Polysubstance abuse (HCC)   Severe protein-calorie malnutrition (HCC)   COPD with acute exacerbation (HCC)   Normocytic anemia   Opioid use disorder   Cocaine abuse   Tetrahydrocannabinol (THC) use disorder   Hypokalemia   Hyperglycemia   Drug-seeking behavior   Leukocytosis   Sepsis secondary to multifocal pneumonia  - pt responding to IV fluid hydration - following blood culture - obtain sputum culture and follow - follow up legionella and strep pneumo testing  - admit to stepdown ICU  Acute on chronic respiratory failure with hypoxia  - secondary to acute COPD exacerbation and superimposed multifocal pneumonia - Broad spectrum antibiotics for presumed HCAP given multiple recent hospitalizations - pt has history of rapid decompensation and intubation therefore I will place her in the stepdown ICU until I am  sure she is more stable for the med/surg floor - BIPAP PRN order in place  - add flutter valve for bronchodilator treatments - consult to respiratory therapy   Acute COPD exacerbation  Multifocal Pneumonia  - MRSA screen negative so we can discontinue IV vancomycin therapy  - continue IV steroids: solumedrol 40 mg IV BID  - continue scheduled bronchodilators  - continue mucinex 600 mg BID  - continue delsym cough syrup  - continue IV cefepime per pharm D dosing for pseudomonas aeruginosa coverage  - add procalcitonin test  - follow blood cultures closely  - follow up legionella and strep  pneumo testing  - sputum culture still pending  - low threshold to start bipap given history of rapid decompensation - pt remains full code, will intubate if necessary  - follow up urine toxicology screen given history   Leukocytosis  - secondary to pneumonia and steroids - follow CBC diff  Normocytic anemia  - Hg stable from recent testing - follow CBC daily  - transfuse for Hg<7  Tobacco Abuse - pt still smoking - will offer nicotine patch 14 mg daily  - counseled briefly on tobacco cessation at bedside   Polysubstance abuse  - follow up urine toxicology screen  - monitor for acute drug withdrawal symptoms   Hypokalemia - check serum magnesium  - oral and IV potassium replacement ordered - recheck serum potassium in AM   Hyperglycemia  - suspect this is steroid induced - add on A1c   Severe protein calorie malnutrition - exacerbated by recreational substance use and recent illness - add on prealbumin - consult dietitian   Elevated BPs and sinus tachycardia - possible physiologic response to IV steroids and albuterol treatments given in ED - follow closely in stepdown ICU and address as needed    Critical Care Procedure Note Authorized and Performed by: Maryln Manuel MD  Total Critical Care time:  70 mins Due to a high probability of clinically significant, life threatening  deterioration, the patient required my highest level of preparedness to intervene emergently and I personally spent this critical care time directly and personally managing the patient.  This critical care time included obtaining a history; examining the patient, pulse oximetry; ordering and review of studies; arranging urgent treatment with development of a management plan; evaluation of patient's response of treatment; frequent reassessment; and discussions with other providers.  This critical care time was performed to assess and manage the high probability of imminent and life threatening deterioration that could result in multi-organ failure.  It was exclusive of separately billable procedures and treating other patients and teaching time.    DVT prophylaxis: sq heparin   Code Status: Full   Disposition Plan: admit to stepdown ICU   Consults called: dietitian, respiratory therapy   Admission status: INP   Level of care: Stepdown Standley Dakins MD [facility] How to contact the [facility] Attending or Consulting provider 7A - 7P or covering provider during after hours 7P -7A, for this patient?  Check the care team in [facility] and look for a) attending/consulting TR[facility] provider listed and b) the [facility] team listed Log into [email or web address] and use 's universal password to access. If you do not have the password, please contact the hospital operator. Locate the [facility] provider you are looking for under [facility] and page to a number that you can be directly reached. If you still have difficulty reaching the provider, please page the [facility] (Director on Call) for the Hospitalists listed on amion for assistance.   If 7PM-7AM, please contact night-coverage [email or web address] Password [facility]  07/26/2023, 10:41 AM

## 2023-07-26 NOTE — Sepsis Progress Note (Deleted)
Antibiotics were given before blood cultures were collected because the order for blood cultures were not in until after the antibiotics were already administered.

## 2023-07-27 DIAGNOSIS — E440 Moderate protein-calorie malnutrition: Secondary | ICD-10-CM | POA: Insufficient documentation

## 2023-07-27 DIAGNOSIS — J189 Pneumonia, unspecified organism: Secondary | ICD-10-CM | POA: Diagnosis not present

## 2023-07-27 DIAGNOSIS — J441 Chronic obstructive pulmonary disease with (acute) exacerbation: Secondary | ICD-10-CM

## 2023-07-27 DIAGNOSIS — J9621 Acute and chronic respiratory failure with hypoxia: Secondary | ICD-10-CM | POA: Diagnosis not present

## 2023-07-27 DIAGNOSIS — A419 Sepsis, unspecified organism: Secondary | ICD-10-CM | POA: Diagnosis not present

## 2023-07-27 LAB — COMPREHENSIVE METABOLIC PANEL
ALT: 17 U/L (ref 0–44)
AST: 9 U/L — ABNORMAL LOW (ref 15–41)
Albumin: 2.5 g/dL — ABNORMAL LOW (ref 3.5–5.0)
Alkaline Phosphatase: 81 U/L (ref 38–126)
Anion gap: 10 (ref 5–15)
BUN: 9 mg/dL (ref 8–23)
CO2: 28 mmol/L (ref 22–32)
Calcium: 8.3 mg/dL — ABNORMAL LOW (ref 8.9–10.3)
Chloride: 100 mmol/L (ref 98–111)
Creatinine, Ser: 0.44 mg/dL (ref 0.44–1.00)
GFR, Estimated: >60 mL/min (ref >60)
Glucose, Bld: 172 mg/dL — ABNORMAL HIGH (ref 70–99)
Potassium: 3.7 mmol/L (ref 3.5–5.1)
Sodium: 138 mmol/L (ref 135–145)
Total Bilirubin: 0.3 mg/dL (ref 0.3–1.2)
Total Protein: 5.9 g/dL — ABNORMAL LOW (ref 6.5–8.1)

## 2023-07-27 LAB — CBC WITH DIFFERENTIAL/PLATELET
Abs Immature Granulocytes: 0.1 10*3/uL — ABNORMAL HIGH (ref 0.00–0.07)
Basophils Absolute: 0 10*3/uL (ref 0.0–0.1)
Basophils Relative: 0 %
Eosinophils Absolute: 0 10*3/uL (ref 0.0–0.5)
Eosinophils Relative: 0 %
HCT: 30.7 % — ABNORMAL LOW (ref 36.0–46.0)
Hemoglobin: 9.5 g/dL — ABNORMAL LOW (ref 12.0–15.0)
Immature Granulocytes: 1 %
Lymphocytes Relative: 9 %
Lymphs Abs: 1.5 10*3/uL (ref 0.7–4.0)
MCH: 27.4 pg (ref 26.0–34.0)
MCHC: 30.9 g/dL (ref 30.0–36.0)
MCV: 88.5 fL (ref 80.0–100.0)
Monocytes Absolute: 0.6 10*3/uL (ref 0.1–1.0)
Monocytes Relative: 3 %
Neutro Abs: 14.4 10*3/uL — ABNORMAL HIGH (ref 1.7–7.7)
Neutrophils Relative %: 87 %
Platelets: 376 10*3/uL (ref 150–400)
RBC: 3.47 MIL/uL — ABNORMAL LOW (ref 3.87–5.11)
RDW: 15.8 % — ABNORMAL HIGH (ref 11.5–15.5)
WBC: 16.6 10*3/uL — ABNORMAL HIGH (ref 4.0–10.5)
nRBC: 0 % (ref 0.0–0.2)

## 2023-07-27 LAB — MAGNESIUM: Magnesium: 2.1 mg/dL (ref 1.7–2.4)

## 2023-07-27 MED ORDER — TRAZODONE HCL 50 MG PO TABS: 50.0000 mg | ORAL_TABLET | Freq: Every evening | ORAL | Status: DC | PRN

## 2023-07-27 MED ORDER — GUAIFENESIN-DM 100-10 MG/5ML PO SYRP
5.0000 mL | ORAL_SOLUTION | ORAL | Status: DC | PRN
  Administered 2023-07-27 – 2023-07-31 (×9): 5 mL via ORAL
  Filled 2023-07-27 (×9): qty 5

## 2023-07-27 MED ORDER — FUROSEMIDE 10 MG/ML IJ SOLN
20.0000 mg | Freq: Every evening | INTRAMUSCULAR | Status: DC
  Administered 2023-07-27 – 2023-07-29 (×3): 20 mg via INTRAVENOUS
  Filled 2023-07-27 (×3): qty 2

## 2023-07-27 MED ORDER — ADULT MULTIVITAMIN W/MINERALS CH
1.0000 | ORAL_TABLET | Freq: Every day | ORAL | Status: DC
  Administered 2023-07-27 – 2023-07-31 (×5): 1 via ORAL
  Filled 2023-07-27 (×5): qty 1

## 2023-07-27 MED ORDER — POTASSIUM CHLORIDE CRYS ER 20 MEQ PO TBCR: 30.0000 meq | EXTENDED_RELEASE_TABLET | Freq: Every evening | ORAL | Status: DC

## 2023-07-27 MED ORDER — LORAZEPAM 0.5 MG PO TABS
0.5000 mg | ORAL_TABLET | Freq: Every evening | ORAL | Status: DC | PRN
  Administered 2023-07-27 – 2023-07-29 (×3): 0.5 mg via ORAL
  Filled 2023-07-27 (×3): qty 1

## 2023-07-27 MED ORDER — POTASSIUM CHLORIDE 10 MEQ/100ML IV SOLN
10.0000 meq | INTRAVENOUS | Status: AC
  Administered 2023-07-27 – 2023-07-28 (×3): 10 meq via INTRAVENOUS
  Filled 2023-07-27 (×2): qty 100

## 2023-07-27 MED ORDER — OXYCODONE HCL 5 MG PO TABS
5.0000 mg | ORAL_TABLET | Freq: Once | ORAL | Status: AC
  Administered 2023-07-27: 5 mg via ORAL
  Filled 2023-07-27: qty 1

## 2023-07-27 MED ORDER — ENSURE ENLIVE PO LIQD
237.0000 mL | Freq: Three times a day (TID) | ORAL | Status: DC
  Administered 2023-07-27 – 2023-07-30 (×7): 237 mL via ORAL

## 2023-07-27 MED ORDER — METHYLPREDNISOLONE SODIUM SUCC 125 MG IJ SOLR
60.0000 mg | Freq: Two times a day (BID) | INTRAMUSCULAR | Status: DC
  Administered 2023-07-27 – 2023-07-31 (×8): 60 mg via INTRAVENOUS
  Filled 2023-07-27 (×8): qty 2

## 2023-07-27 NOTE — TOC Initial Note (Signed)
Transition of Care [facility]) - Initial/Assessment Note    Patient Details  Name: Casey [name] MRN: [id number] Date of Birth: 1961-02-02  Transition of Care [facility]) CM/SW Contact:    Villa Herb, LCSWA Phone Number: 07/27/2023, 10:36 AM  Clinical Narrative:                 Pt is high risk for readmission. CSW spoke with pts son to complete assessment as pt is currently on Bipap. Pts son lives with her. Pt is independent in completing her ADLs and drives herself to appointments. Pt has not had HH per her son. Pt has a walker and home O2 through [employer]. TOC to follow.   Expected Discharge Plan: Home/Self Care Barriers to Discharge: Continued Medical Work up   Patient Goals and CMS Choice Patient states their goals for this hospitalization and ongoing recovery are:: return home CMS Medicare.gov Compare Post Acute Care list provided to:: Patient Choice offered to / list presented to : Patient      Expected Discharge Plan and Services In-house Referral: Clinical Social Work Discharge Planning Services: CM Consult Post Acute Care Choice: Durable Medical Equipment Living arrangements for the past 2 months: Single Family Home                                      Prior Living Arrangements/Services Living arrangements for the past 2 months: Single Family Home Lives with:: Adult Children Patient language and need for interpreter reviewed:: Yes Do you feel safe going back to the place where you live?: Yes      Need for Family Participation in Patient Care: Yes (Comment) Care giver support system in place?: Yes (comment) Current home services: DME Criminal Activity/Legal Involvement Pertinent to Current Situation/Hospitalization: No - Comment as needed  Activities of Daily Living      Permission Sought/Granted                  Emotional Assessment         Alcohol / Substance Use: Not Applicable Psych Involvement: No (comment)  Admission diagnosis:  Hypokalemia  [E87.6] Acute and chronic respiratory failure with hypoxia (HCC) [J96.21] COPD with acute exacerbation (HCC) [J44.1] HCAP (healthcare-associated pneumonia) [J18.9] Sepsis due to pneumonia (HCC) [J18.9, A41.9] Patient Active Problem List   Diagnosis Date Noted   Sepsis due to pneumonia (HCC) 07/26/2023   Acute and chronic respiratory failure with hypoxia (HCC) 07/26/2023   Cocaine abuse 07/26/2023   Tetrahydrocannabinol (THC) use disorder 07/26/2023   Hypokalemia 07/26/2023   Hyperglycemia 07/26/2023   Drug-seeking behavior 07/26/2023   Leukocytosis 07/26/2023   Opioid use disorder 07/17/2023   Pneumonia of right upper lobe due to infectious organism 07/08/2023   Delirium 07/07/2023   Vomiting without nausea 07/07/2023   COPD with acute exacerbation (HCC) 07/06/2023   Normocytic anemia 07/06/2023   Pneumonia of right lower lobe due to infectious organism 07/06/2023   Nausea and vomiting 07/05/2023   Severe protein-calorie malnutrition (HCC) 06/29/2023   Acute respiratory failure with hypoxia (HCC) 06/26/2023   Polysubstance abuse (HCC) 06/26/2023   Acute metabolic encephalopathy 06/26/2023   Aspiration pneumonia (HCC) 06/25/2023   Primary insomnia 08/20/2021   Depression, recurrent (HCC) 08/20/2021   GAD (generalized anxiety disorder) 08/20/2021   Elevated blood pressure reading in office without diagnosis of hypertension 08/20/2021   Closed fracture of left proximal humerus 01/01/2020   Fracture of base of fifth  metacarpal bone of right hand 10/06/2016   Benzodiazepine dependence, continuous (HCC) 01/09/2016   History of panic attacks 09/11/2014   Malaise 09/03/2014   Fatigue 09/03/2014   Body mass index (BMI) of 20.0-20.9 in adult 09/03/2014   Right clavicle fracture 12/12/2013   PCP:  Patient, No Pcp Per Pharmacy:   [id number] - [place], [place] - [address] [place] [address] Phone: [phone]  Fax: [phone]     Social Determinants of Health (SDOH) Social History: SDOH Screenings   Food Insecurity: No Food Insecurity (07/17/2023)  Housing: Low Risk  (07/17/2023)  Transportation Needs: No Transportation Needs (07/17/2023)  Utilities: Not At Risk (07/17/2023)  Depression (PHQ2-9): Medium Risk (02/24/2023)  Tobacco Use: High Risk (07/26/2023)   SDOH Interventions:     Readmission Risk Interventions    07/27/2023   10:34 AM  Readmission Risk Prevention Plan  Transportation Screening Complete  HRI or Home Care Consult Complete  Social Work Consult for Recovery Care Planning/Counseling Complete  Palliative Care Screening Not Applicable  Medication Review Oceanographer) Complete

## 2023-07-27 NOTE — Progress Notes (Signed)
PROGRESS NOTE   ALBANIE BRIEF  WUJ:[id number] DOB: 12/24/1960 DOA: 07/26/2023 PCP: Patient, No Pcp Per   Chief Complaint  Patient presents with   Shortness of Breath   Level of care: Stepdown  Brief Admission History:  62 year old female with past medical history significant for chronic pain,  chronic respiratory failure, COPD, chronic tobacco use, history of taking opioids and benzodiazepines that have not been prescribed.  History of cocaine, THC use.  Pt presents today after several recent hospitalizations.  She was admitted 8/17 thru 9/2 with encephalopathy from polysubstance abuse and aspiration pneumonia and subsequently decompensated requiring intubation and transferred to [facility].  She was readmitted on 9/7 with acute respiratory failure with hypoxia, COPD exacerbation and subsequently discharged 9/9 on 2L/min supplemental oxygen.   She returns to ED 9/16 from home complaining of increasing shortness of breath not improving with home inhalers. She apparently does not have a nebulizer at home.  She had increased her oxygen to 4L/min and remained very short of breath with cough, chest congestion, fever and malaise.  She reports that she has recently stopped using recreational drugs.  She is still smoking but has apparently cut back on cigarettes.   She required NRB oxygenation but then weaned to 6L/min [place].  Her CXR was positive for multifocal areas of pneumonia.  Covid/Flu/RSV testing was negative.  She is being admitted to stepdown ICU due to known high risk for acute decompensation for acute on chronic respiratory failure secondary to multifocal pneumonia and COPD exacerbation.    Assessment and Plan:  Sepsis secondary to multifocal pneumonia  - pt responding to IV fluid hydration - following blood culture - obtain sputum culture and follow - follow up legionella and strep pneumo testing  - continue stepdown ICU care   Acute on chronic respiratory failure with hypoxia  -  secondary to acute COPD exacerbation and superimposed multifocal pneumonia - Broad spectrum antibiotics for presumed HCAP given multiple recent hospitalizations - pt has history of rapid decompensation and intubation therefore I will place her in the stepdown ICU until I am sure she is more stable for the med/surg floor - BIPAP PRN order in place and patient is currently on bipap - added flutter valve for bronchodilator treatments - consult to respiratory therapy    Acute COPD exacerbation  Multifocal Pneumonia  - MRSA screen negative so we can discontinue IV vancomycin therapy  - continue IV steroids: increased solumedrol 60 mg IV BID  - continue scheduled bronchodilators  - continue mucinex 600 mg BID  - continue delsym cough syrup  - continue IV cefepime per pharm D dosing for pseudomonas aeruginosa coverage  - add procalcitonin test: <0.10 suggesting this may well be a difficult to treat viral pneumonia - follow blood cultures closely  - follow up legionella and strep pneumo testing  - sputum culture still pending  - low threshold to start bipap given history of rapid decompensation - pt remains full code, will intubate if necessary  - follow up urine toxicology screen given history    Leukocytosis  - secondary to pneumonia and steroids - follow CBC diff   Normocytic anemia  - Hg stable from recent testing - follow CBC daily  - transfuse for Hg<7   Tobacco Abuse - pt still smoking - will offer nicotine patch 14 mg daily  - counseled briefly on tobacco cessation at bedside    Polysubstance abuse  - follow up urine toxicology screen  - monitor for acute drug withdrawal  symptoms    Hypokalemia - check serum magnesium  - oral and IV potassium replacement ordered - recheck serum potassium in AM    Hyperglycemia / Prediabetes mellitus - suspect this is steroid induced - add on A1c --> 6.0%    Severe protein calorie malnutrition - exacerbated by recreational substance  use and recent illness - add on prealbumin - consult dietitian    Elevated BPs and sinus tachycardia - possible physiologic response to IV steroids and albuterol treatments given in ED - follow closely in stepdown ICU and address as needed    DVT prophylaxis: heparin SQ Code Status: Full  Disposition: continue ICU care   Consultants:   Procedures:   Antimicrobials:  Cefepime  Vancomycin - discontinued with neg MRSA screen  Subjective: Pt reports no real improvement in her breathing, currently on bipap but able to interact and make needs known  Objective: Vitals:   07/27/23 1429 07/27/23 1500 07/27/23 1600 07/27/23 1610  BP:  (!) 180/94 (!) 164/75   Pulse:  (!) 109 87   Resp:  (!) 37 (!) 31   Temp:    97.7 F (36.5 C)  TempSrc:    Oral  SpO2: 97% 95% 95%   Weight:      Height:        Intake/Output Summary (Last 24 hours) at 07/27/2023 1712 Last data filed at 07/27/2023 1556 Gross per 24 hour  Intake 1467.76 ml  Output 800 ml  Net 667.76 ml   Filed Weights   07/26/23 0233 07/26/23 2127  Weight: 58.1 kg 61.7 kg   Examination:  General exam: frail, emaciated female on bipap, appears acutely and chronically ill and moderately distressed; uncomfortable;  Respiratory system: poor air movement, diffuse rales and wheezing heard throughout both lungs Cardiovascular system: tachycardic normal S1 & S2 heard. No JVD, murmurs, rubs, gallops or clicks. No pedal edema. Gastrointestinal system: Abdomen is nondistended, soft and nontender. No organomegaly or masses felt. Normal bowel sounds heard. Central nervous system: Alert and oriented. No focal neurological deficits. Extremities: Symmetric 5 x 5 power. Skin: No rashes, lesions or ulcers. Psychiatry: Judgement and insight appear normal. Mood & affect appropriate.   Data Reviewed: I have personally reviewed following labs and imaging studies  CBC: Recent Labs  Lab 07/26/23 0244 07/26/23 0525 07/27/23 0429  WBC 15.5*  14.5* 16.6*  NEUTROABS 11.3* 13.2* 14.4*  HGB 10.7* 10.6* 9.5*  HCT 33.2* 33.6* 30.7*  MCV 86.2 88.0 88.5  PLT 497* 433* 376    Basic Metabolic Panel: Recent Labs  Lab 07/26/23 0244 07/26/23 0525 07/27/23 0429  NA 134* 135 138  K 2.9* 2.9* 3.7  CL 96* 100 100  CO2 29 27 28   GLUCOSE 207* 271* 172*  BUN 9 8 9   CREATININE 0.46 0.46 0.44  CALCIUM 7.9* 7.9* 8.3*  MG  --  2.2 2.1    CBG: No results for input(s): "GLUCAP" in the last 168 hours.  Recent Results (from the past 240 hour(s))  Resp panel by RT-PCR (RSV, Flu A&B, Covid) Anterior Nasal Swab     Status: None   Collection Time: 07/26/23  2:35 AM   Specimen: Anterior Nasal Swab  Result Value Ref Range Status   SARS Coronavirus 2 by RT PCR NEGATIVE NEGATIVE Final    Comment: (NOTE) SARS-CoV-2 target nucleic acids are NOT DETECTED.  The SARS-CoV-2 RNA is generally detectable in upper respiratory specimens during the acute phase of infection. The lowest concentration of SARS-CoV-2 viral copies this assay can  detect is 138 copies/mL. A negative result does not preclude SARS-Cov-2 infection and should not be used as the sole basis for treatment or other patient management decisions. A negative result may occur with  improper specimen collection/handling, submission of specimen other than nasopharyngeal swab, presence of viral mutation(s) within the areas targeted by this assay, and inadequate number of viral copies(<138 copies/mL). A negative result must be combined with clinical observations, patient history, and epidemiological information. The expected result is Negative.  Fact Sheet for Patients:  BloggerCourse.com  Fact Sheet for Healthcare Providers:  SeriousBroker.it  This test is no t yet approved or cleared by the [place] FDA and  has been authorized for detection and/or diagnosis of SARS-CoV-2 by FDA under an Emergency Use Authorization (EUA). This  EUA will remain  in effect (meaning this test can be used) for the duration of the COVID-19 declaration under Section 564(b)(1) of the Act, 21 U.S.C.section 360bbb-3(b)(1), unless the authorization is terminated  or revoked sooner.       Influenza A by PCR NEGATIVE NEGATIVE Final   Influenza B by PCR NEGATIVE NEGATIVE Final    Comment: (NOTE) The Xpert Xpress SARS-CoV-2/FLU/RSV plus assay is intended as an aid in the diagnosis of influenza from Nasopharyngeal swab specimens and should not be used as a sole basis for treatment. Nasal washings and aspirates are unacceptable for Xpert Xpress SARS-CoV-2/FLU/RSV testing.  Fact Sheet for Patients: BloggerCourse.com  Fact Sheet for Healthcare Providers: SeriousBroker.it  This test is not yet approved or cleared by the [place] FDA and has been authorized for detection and/or diagnosis of SARS-CoV-2 by FDA under an Emergency Use Authorization (EUA). This EUA will remain in effect (meaning this test can be used) for the duration of the COVID-19 declaration under Section 564(b)(1) of the Act, 21 U.S.C. section 360bbb-3(b)(1), unless the authorization is terminated or revoked.     Resp Syncytial Virus by PCR NEGATIVE NEGATIVE Final    Comment: (NOTE) Fact Sheet for Patients: BloggerCourse.com  Fact Sheet for Healthcare Providers: SeriousBroker.it  This test is not yet approved or cleared by the [place] FDA and has been authorized for detection and/or diagnosis of SARS-CoV-2 by FDA under an Emergency Use Authorization (EUA). This EUA will remain in effect (meaning this test can be used) for the duration of the COVID-19 declaration under Section 564(b)(1) of the Act, 21 U.S.C. section 360bbb-3(b)(1), unless the authorization is terminated or revoked.  Performed at [facility], [address], [place], [place] [address]    Culture, blood (routine x 2)     Status: None (Preliminary result)   Collection Time: 07/26/23  2:44 AM   Specimen: Left Antecubital; Blood  Result Value Ref Range Status   Specimen Description LEFT ANTECUBITAL  Final   Special Requests   Final    BOTTLES DRAWN AEROBIC AND ANAEROBIC Blood Culture adequate volume   Culture   Final    NO GROWTH 1 DAY Performed at [facility], [address] [place], [place] [address]    Report Status PENDING  Incomplete  Culture, blood (Routine X 2) w Reflex to ID Panel     Status: None (Preliminary result)   Collection Time: 07/26/23  5:25 AM   Specimen: BLOOD LEFT FOREARM  Result Value Ref Range Status   Specimen Description   Final    BLOOD LEFT FOREARM BOTTLES DRAWN AEROBIC AND ANAEROBIC   Special Requests   Final    Blood Culture results may not be optimal due to  an excessive volume of blood received in culture bottles   Culture   Final    NO GROWTH 1 DAY Performed at [facility], [address] [place], [place] [address]    Report Status PENDING  Incomplete  MRSA Next Gen by PCR, Nasal     Status: None   Collection Time: 07/26/23  5:35 AM   Specimen: Nasal Mucosa; Nasal Swab  Result Value Ref Range Status   MRSA by PCR Next Gen NOT DETECTED NOT DETECTED Final    Comment: (NOTE) The GeneXpert MRSA Assay (FDA approved for NASAL specimens only), is one component of a comprehensive MRSA colonization surveillance program. It is not intended to diagnose MRSA infection nor to guide or monitor treatment for MRSA infections. Test performance is not FDA approved in patients less than 1 years old. Performed at [facility], [address], [place], [place] [address]   Expectorated Sputum Assessment w Gram Stain, Rflx to Resp Cult     Status: None   Collection Time: 07/26/23 11:38 AM   Specimen: Sputum  Result Value Ref Range Status   Specimen Description SPUTUM  Final   Special Requests NONE  Final   Sputum evaluation   Final    THIS  SPECIMEN IS ACCEPTABLE FOR SPUTUM CULTURE Performed at [facility] [address], [place], [place] [address]    Report Status 07/26/2023 FINAL  Final  Culture, Respiratory w Gram Stain     Status: None (Preliminary result)   Collection Time: 07/26/23 11:38 AM   Specimen: SPU  Result Value Ref Range Status   Specimen Description   Final    SPUTUM Performed at [facility], [address] [place], [place] [address]    Special Requests   Final    NONE Reflexed from [id number] Performed at [facility], [address], [place], [place] [address]    Gram Stain   Final    ABUNDANT SQUAMOUS EPITHELIAL CELLS PRESENT NO WBC SEEN RARE GRAM POSITIVE COCCI IN PAIRS RARE BUDDING YEAST SEEN RARE GRAM NEGATIVE RODS    Culture   Final    TOO YOUNG TO READ Performed at [facility], [address], [place], [place] [address]    Report Status PENDING  Incomplete     Radiology Studies: DG Chest Port 1 View  Result Date: 07/26/2023 CLINICAL DATA:  Cough, hypoxia, COPD EXAM: PORTABLE CHEST 1 VIEW COMPARISON:  Radiographs and CT from 07/17/2023 FINDINGS: Bilateral patchy airspace and interstitial opacities. Chronic interstitial coarsening. No pleural effusion or pneumothorax. Stable cardiomediastinal silhouette. Left reverse TSA. IMPRESSION: Patchy airspace and interstitial opacities, increased from 07/17/2023 and favoring multifocal pneumonia. Electronically Signed   By: Minerva Fester M.D.   On: 07/26/2023 02:50    Scheduled Meds:  Chlorhexidine Gluconate Cloth  6 each Topical Daily   citalopram  20 mg Oral Daily   feeding supplement  237 mL Oral TID BM   fluticasone furoate-vilanterol  1 puff Inhalation Daily   furosemide  20 mg Intravenous QPM   guaiFENesin  600 mg Oral BID   heparin  5,000 Units Subcutaneous Q8H   ipratropium-albuterol  3 mL Nebulization Q6H   melatonin  6 mg Oral QHS   methylPREDNISolone (SOLU-MEDROL) injection  60 mg Intravenous Q12H   multivitamin with minerals  1  tablet Oral Daily   nicotine  14 mg Transdermal Daily   Continuous Infusions:  ceFEPime (MAXIPIME) IV 2 g (07/27/23 1314)   lactated ringers 50 mL/hr at 07/27/23 1556  potassium chloride       LOS: 1 day   Critical Care Procedure Note Authorized and Performed by: Maryln Manuel MD  Total Critical Care time:  57 mins Due to a high probability of clinically significant, life threatening deterioration, the patient required my highest level of preparedness to intervene emergently and I personally spent this critical care time directly and personally managing the patient.  This critical care time included obtaining a history; examining the patient, pulse oximetry; ordering and review of studies; arranging urgent treatment with development of a management plan; evaluation of patient's response of treatment; frequent reassessment; and discussions with other providers.  This critical care time was performed to assess and manage the high probability of imminent and life threatening deterioration that could result in multi-organ failure.  It was exclusive of separately billable procedures and treating other patients and teaching time.    Standley Dakins, MD How to contact the [facility] Attending or Consulting provider 7A - 7P or covering provider during after hours 7P -7A, for this patient?  Check the care team in [facility] and look for a) attending/consulting TR[facility] provider listed and b) the [facility] team listed Log into [email or web address] and use [facility] universal password to access. If you do not have the password, please contact the hospital operator. Locate the [facility] provider you are looking for under [facility] and page to a number that you can be directly reached. If you still have difficulty reaching the provider, please page the [facility] (Director on Call) for the Hospitalists listed on amion for assistance.  07/27/2023, 5:12 PM

## 2023-07-27 NOTE — Progress Notes (Addendum)
Initial Nutrition Assessment  DOCUMENTATION CODES:   Non-severe (moderate) malnutrition in context of chronic illness  INTERVENTION:   Chocolate Ensure Plus High Protein po TID, each supplement provides 350 kcal and 20 grams of protein. MVI with minerals daily.  NUTRITION DIAGNOSIS:   Moderate Malnutrition related to chronic illness (COPD with multiple hospitalizations in the past month) as evidenced by moderate muscle depletion, moderate fat depletion, percent weight loss (6% weight loss x 1 month).  GOAL:   Patient will meet greater than or equal to 90% of their needs  MONITOR:   PO intake, Supplement acceptance  REASON FOR ASSESSMENT:   Consult Assessment of nutrition requirement/status  ASSESSMENT:   62 yo female admitted with COPD exacerbation, sepsis d/t PNA. PMH includes chronic back pain, COPD, tobacco use, polysubstance abuse (cocaine, THC, non-prescribed opioids & benzos).  Patient reports decreased appetite and decreased intake over the past month d/t multiple hospitalizations. She thinks she has lost about 10 lbs over the past month. She typically eats well, but has not been eating so well lately d/t difficulty breathing. She has been on and off BiPAP since admission. She agreed to drink chocolate Ensure or Boost to maximize intake of protein and calories.   Labs reviewed. A1C 6 CBG: N/A  Medications reviewed and include Solumedrol. IVF: LR at 50 ml/h  Weight history reviewed. Patient weighed 65.5 kg 2 weeks ago, currently 61.7 kg. 6% weight loss within a month is significant.   NUTRITION - FOCUSED PHYSICAL EXAM:  Flowsheet Row Most Recent Value  Orbital Region Moderate depletion  Upper Arm Region Moderate depletion  Thoracic and Lumbar Region Mild depletion  Buccal Region Mild depletion  Temple Region Mild depletion  Clavicle Bone Region Moderate depletion  Clavicle and Acromion Bone Region Moderate depletion  Scapular Bone Region Moderate depletion   Dorsal Hand Moderate depletion  Patellar Region Mild depletion  Anterior Thigh Region Moderate depletion  Posterior Calf Region Moderate depletion  Edema (RD Assessment) None  Hair Reviewed  Eyes Reviewed  Mouth Reviewed  Skin Reviewed  Nails Reviewed       Diet Order:   Diet Order             Diet Heart Room service appropriate? Yes; Fluid consistency: Thin  Diet effective now                   EDUCATION NEEDS:   Education needs have been addressed  Skin:  Skin Assessment: Reviewed RN Assessment  Last BM:  unknown  Height:   Ht Readings from Last 1 Encounters:  07/26/23 5\' 5"  (1.651 m)    Weight:   Wt Readings from Last 1 Encounters:  07/26/23 61.7 kg    Ideal Body Weight:  56.8 kg  BMI:  Body mass index is 22.64 kg/m.  Estimated Nutritional Needs:   Kcal:  1700-1900  Protein:  85-95 gm  Fluid:  1.7-1.9 L   Gabriel Rainwater RD, LDN, CNSC Please refer to Amion for contact information.

## 2023-07-28 DIAGNOSIS — E876 Hypokalemia: Secondary | ICD-10-CM | POA: Diagnosis not present

## 2023-07-28 DIAGNOSIS — J9621 Acute and chronic respiratory failure with hypoxia: Secondary | ICD-10-CM | POA: Diagnosis not present

## 2023-07-28 DIAGNOSIS — J441 Chronic obstructive pulmonary disease with (acute) exacerbation: Secondary | ICD-10-CM | POA: Diagnosis not present

## 2023-07-28 DIAGNOSIS — J189 Pneumonia, unspecified organism: Secondary | ICD-10-CM | POA: Diagnosis not present

## 2023-07-28 LAB — CBC WITH DIFFERENTIAL/PLATELET
Abs Immature Granulocytes: 0.17 10*3/uL — ABNORMAL HIGH (ref 0.00–0.07)
Basophils Absolute: 0 10*3/uL (ref 0.0–0.1)
Basophils Relative: 0 %
Eosinophils Absolute: 0 10*3/uL (ref 0.0–0.5)
Eosinophils Relative: 0 %
HCT: 33.1 % — ABNORMAL LOW (ref 36.0–46.0)
Hemoglobin: 10.6 g/dL — ABNORMAL LOW (ref 12.0–15.0)
Immature Granulocytes: 1 %
Lymphocytes Relative: 11 %
Lymphs Abs: 2.2 10*3/uL (ref 0.7–4.0)
MCH: 28.1 pg (ref 26.0–34.0)
MCHC: 32 g/dL (ref 30.0–36.0)
MCV: 87.8 fL (ref 80.0–100.0)
Monocytes Absolute: 0.9 10*3/uL (ref 0.1–1.0)
Monocytes Relative: 5 %
Neutro Abs: 17.3 10*3/uL — ABNORMAL HIGH (ref 1.7–7.7)
Neutrophils Relative %: 83 %
Platelets: 460 10*3/uL — ABNORMAL HIGH (ref 150–400)
RBC: 3.77 MIL/uL — ABNORMAL LOW (ref 3.87–5.11)
RDW: 16.3 % — ABNORMAL HIGH (ref 11.5–15.5)
WBC: 20.6 10*3/uL — ABNORMAL HIGH (ref 4.0–10.5)
nRBC: 0 % (ref 0.0–0.2)

## 2023-07-28 LAB — COMPREHENSIVE METABOLIC PANEL
ALT: 21 U/L (ref 0–44)
AST: 9 U/L — ABNORMAL LOW (ref 15–41)
Albumin: 2.8 g/dL — ABNORMAL LOW (ref 3.5–5.0)
Alkaline Phosphatase: 88 U/L (ref 38–126)
Anion gap: 10 (ref 5–15)
BUN: 15 mg/dL (ref 8–23)
CO2: 30 mmol/L (ref 22–32)
Calcium: 8.8 mg/dL — ABNORMAL LOW (ref 8.9–10.3)
Chloride: 98 mmol/L (ref 98–111)
Creatinine, Ser: 0.52 mg/dL (ref 0.44–1.00)
GFR, Estimated: >60 mL/min (ref >60)
Glucose, Bld: 143 mg/dL — ABNORMAL HIGH (ref 70–99)
Potassium: 4.1 mmol/L (ref 3.5–5.1)
Sodium: 138 mmol/L (ref 135–145)
Total Bilirubin: 0.2 mg/dL — ABNORMAL LOW (ref 0.3–1.2)
Total Protein: 6.5 g/dL (ref 6.5–8.1)

## 2023-07-28 LAB — RESPIRATORY PANEL BY PCR

## 2023-07-28 LAB — BRAIN NATRIURETIC PEPTIDE: B Natriuretic Peptide: 154 pg/mL — ABNORMAL HIGH (ref 0.0–100.0)

## 2023-07-28 LAB — PROCALCITONIN: Procalcitonin: <0.1 ng/mL

## 2023-07-28 LAB — TROPONIN I (HIGH SENSITIVITY)
Troponin I (High Sensitivity): 4 ng/L (ref <18)
Troponin I (High Sensitivity): 3 ng/L (ref <18)

## 2023-07-28 MED ORDER — ARFORMOTEROL TARTRATE 15 MCG/2ML IN NEBU
15.0000 ug | INHALATION_SOLUTION | Freq: Two times a day (BID) | RESPIRATORY_TRACT | Status: DC
  Administered 2023-07-28 – 2023-07-31 (×6): 15 ug via RESPIRATORY_TRACT
  Filled 2023-07-28 (×6): qty 2

## 2023-07-28 MED ORDER — MORPHINE SULFATE (PF) 2 MG/ML IV SOLN
1.0000 mg | Freq: Once | INTRAVENOUS | Status: AC | PRN
  Administered 2023-07-28: 1 mg via INTRAVENOUS
  Filled 2023-07-28: qty 1

## 2023-07-28 MED ORDER — BUDESONIDE 0.5 MG/2ML IN SUSP
0.5000 mg | Freq: Two times a day (BID) | RESPIRATORY_TRACT | Status: DC
  Administered 2023-07-28 – 2023-07-31 (×6): 0.5 mg via RESPIRATORY_TRACT
  Filled 2023-07-28 (×6): qty 2

## 2023-07-28 NOTE — Progress Notes (Signed)
PROGRESS NOTE  MARKEYSHA ERSKINE XBJ:[id number] DOB: June 05, 1961 DOA: 07/26/2023 PCP: Patient, No Pcp Per  Brief History:  62 year old female with past medical history significant for chronic pain,  chronic respiratory failure, COPD, chronic tobacco use, history of taking opioids and benzodiazepines that have not been prescribed.  History of cocaine, THC use.  Pt presents today after several recent hospitalizations.  She was admitted 8/17 thru 9/2 with encephalopathy from polysubstance abuse and aspiration pneumonia and subsequently decompensated requiring intubation and transferred to [facility].  She was readmitted on 9/7 with acute respiratory failure with hypoxia, COPD exacerbation and subsequently discharged 9/9 on 2L/min supplemental oxygen.   She returns to ED 9/16 from home complaining of increasing shortness of breath not improving with home inhalers. She apparently does not have a nebulizer at home.  She had increased her oxygen to 4L/min and remained very short of breath with cough, chest congestion, fever and malaise.  She reports that she has recently stopped using recreational drugs.  She is still smoking but has apparently cut back on cigarettes.   She required NRB oxygenation but then weaned to 6L/min [place].  Her CXR was positive for multifocal areas of pneumonia.  Covid/Flu/RSV testing was negative.  She is being admitted to stepdown ICU due to known high risk for acute decompensation for acute on chronic respiratory failure secondary to multifocal pneumonia and COPD exacerbation.    Assessment/Plan:  Sepsis secondary to multifocal pneumonia  - given IVF initially - following blood culture--neg to date - obtain sputum culture and follow - follow up legionella and strep pneumo testing  - continue stepdown ICU care   Acute on chronic respiratory failure with hypoxia  - secondary to acute COPD exacerbation and superimposed multifocal pneumonia - chronically on 2L - Broad  spectrum antibiotics for presumed HCAP given multiple recent hospitalizations - pt has history of rapid decompensation and intubation therefore I will place her in the stepdown ICU until I am sure she is more stable for the med/surg floor - BIPAP PRN order in place and patient is currently on bipap - add brovana, pulmicort   Acute COPD exacerbation  Multifocal Pneumonia  - MRSA screen negative so we can discontinue IV vancomycin therapy  - continue IV steroids: increased solumedrol 60 mg IV BID  - continue scheduled bronchodilators  - continue mucinex 600 mg BID  - continue IV cefepime per pharm D dosing for pseudomonas aeruginosa coverage  - PCT <0.10 - follow blood cultures closely  - add pulmicort and brovana   Leukocytosis  - secondary to pneumonia and steroids - follow CBC diff   Normocytic anemia  - Hg stable from recent testing - follow CBC daily  - transfuse for Hg<7   Tobacco Abuse - pt still smoking - will offer nicotine patch 14 mg daily  - cessation discussed   Polysubstance abuse  - follow up urine toxicology screen  - monitor for acute drug withdrawal symptoms  -UDS positive for benzo and opiates   Hypokalemia - check serum magnesium  - oral and IV potassium replacement ordered - recheck serum potassium in AM    Hyperglycemia / Prediabetes mellitus - suspect this is steroid induced - add on A1c --> 6.0%    Severe protein calorie malnutrition - add supplements   Elevated BPs and sinus tachycardia - possible physiologic response to IV steroids and albuterol treatments given in ED - follow closely in stepdown ICU and address  as needed      Family Communication:  no Family at bedside  Consultants:  none  Code Status:  FULL   DVT Prophylaxis:   Heparin    Procedures: As Listed in Progress Note Above  Antibiotics: Cefepime 9/16>>      Subjective: Pt complains of sob with minimal exertion.  Complains of cp constant x 1 month.  Denies  n/v/d, abd pain, f/c  Objective: Vitals:   07/28/23 0900 07/28/23 0914 07/28/23 1001 07/28/23 1100  BP: (!) 157/82 (!) 157/82 (!) 173/88   Pulse: (!) 102 88 92   Resp: (!) 33 (!) 37 (!) 24   Temp:    (!) 97.4 F (36.3 C)  TempSrc:    Oral  SpO2: 94% 98% 96%   Weight:      Height:        Intake/Output Summary (Last 24 hours) at 07/28/2023 1302 Last data filed at 07/28/2023 1101 Gross per 24 hour  Intake 2075.75 ml  Output 2400 ml  Net -324.25 ml   Weight change:  Exam:  General:  Pt is alert, follows commands appropriately, not in acute distress HEENT: No icterus, No thrush, No neck mass, [place]/AT Cardiovascular: RRR, S1/S2, no rubs, no gallops Respiratory: bilateral rales.  Bilateral wheeze Abdomen: Soft/+BS, non tender, non distended, no guarding Extremities: No edema, No lymphangitis, No petechiae, No rashes, no synovitis   Data Reviewed: I have personally reviewed following labs and imaging studies Basic Metabolic Panel: Recent Labs  Lab 07/26/23 0244 07/26/23 0525 07/27/23 0429 07/28/23 0504  NA 134* 135 138 138  K 2.9* 2.9* 3.7 4.1  CL 96* 100 100 98  CO2 29 27 28 30   GLUCOSE 207* 271* 172* 143*  BUN 9 8 9 15   CREATININE 0.46 0.46 0.44 0.52  CALCIUM 7.9* 7.9* 8.3* 8.8*  MG  --  2.2 2.1 2.2   Liver Function Tests: Recent Labs  Lab 07/26/23 0525 07/27/23 0429 07/28/23 0504  AST 9* 9* 9*  ALT 20 17 21   ALKPHOS 87 81 88  BILITOT 0.4 0.3 0.2*  PROT 6.1* 5.9* 6.5  ALBUMIN 2.8* 2.5* 2.8*   No results for input(s): "LIPASE", "AMYLASE" in the last 168 hours. No results for input(s): "AMMONIA" in the last 168 hours. Coagulation Profile: No results for input(s): "INR", "PROTIME" in the last 168 hours. CBC: Recent Labs  Lab 07/26/23 0244 07/26/23 0525 07/27/23 0429 07/28/23 0504  WBC 15.5* 14.5* 16.6* 20.6*  NEUTROABS 11.3* 13.2* 14.4* 17.3*  HGB 10.7* 10.6* 9.5* 10.6*  HCT 33.2* 33.6* 30.7* 33.1*  MCV 86.2 88.0 88.5 87.8  PLT 497* 433* 376 460*    Cardiac Enzymes: No results for input(s): "CKTOTAL", "CKMB", "CKMBINDEX", "TROPONINI" in the last 168 hours. BNP: Invalid input(s): "POCBNP" CBG: No results for input(s): "GLUCAP" in the last 168 hours. HbA1C: Recent Labs    07/26/23 1100  HGBA1C 6.0*   Urine analysis:    Component Value Date/Time   COLORURINE YELLOW 06/26/2023 0134   APPEARANCEUR CLEAR 06/26/2023 0134   LABSPEC 1.014 06/26/2023 0134   PHURINE 5.0 06/26/2023 0134   GLUCOSEU NEGATIVE 06/26/2023 0134   HGBUR SMALL (A) 06/26/2023 0134   BILIRUBINUR NEGATIVE 06/26/2023 0134   KETONESUR NEGATIVE 06/26/2023 0134   PROTEINUR NEGATIVE 06/26/2023 0134   UROBILINOGEN 0.2 05/02/2009 1015   NITRITE NEGATIVE 06/26/2023 0134   LEUKOCYTESUR NEGATIVE 06/26/2023 0134   Sepsis Labs: @LABRCNTIP (procalcitonin:4,lacticidven:4) ) Recent Results (from the past 240 hour(s))  Resp panel by RT-PCR (RSV, Flu A&B, Covid)  Anterior Nasal Swab     Status: None   Collection Time: 07/26/23  2:35 AM   Specimen: Anterior Nasal Swab  Result Value Ref Range Status   SARS Coronavirus 2 by RT PCR NEGATIVE NEGATIVE Final    Comment: (NOTE) SARS-CoV-2 target nucleic acids are NOT DETECTED.  The SARS-CoV-2 RNA is generally detectable in upper respiratory specimens during the acute phase of infection. The lowest concentration of SARS-CoV-2 viral copies this assay can detect is 138 copies/mL. A negative result does not preclude SARS-Cov-2 infection and should not be used as the sole basis for treatment or other patient management decisions. A negative result may occur with  improper specimen collection/handling, submission of specimen other than nasopharyngeal swab, presence of viral mutation(s) within the areas targeted by this assay, and inadequate number of viral copies(<138 copies/mL). A negative result must be combined with clinical observations, patient history, and epidemiological information. The expected result is  Negative.  Fact Sheet for Patients:  BloggerCourse.com  Fact Sheet for Healthcare Providers:  SeriousBroker.it  This test is no t yet approved or cleared by the [place] FDA and  has been authorized for detection and/or diagnosis of SARS-CoV-2 by FDA under an Emergency Use Authorization (EUA). This EUA will remain  in effect (meaning this test can be used) for the duration of the COVID-19 declaration under Section 564(b)(1) of the Act, 21 U.S.C.section 360bbb-3(b)(1), unless the authorization is terminated  or revoked sooner.       Influenza A by PCR NEGATIVE NEGATIVE Final   Influenza B by PCR NEGATIVE NEGATIVE Final    Comment: (NOTE) The Xpert Xpress SARS-CoV-2/FLU/RSV plus assay is intended as an aid in the diagnosis of influenza from Nasopharyngeal swab specimens and should not be used as a sole basis for treatment. Nasal washings and aspirates are unacceptable for Xpert Xpress SARS-CoV-2/FLU/RSV testing.  Fact Sheet for Patients: BloggerCourse.com  Fact Sheet for Healthcare Providers: SeriousBroker.it  This test is not yet approved or cleared by the [place] FDA and has been authorized for detection and/or diagnosis of SARS-CoV-2 by FDA under an Emergency Use Authorization (EUA). This EUA will remain in effect (meaning this test can be used) for the duration of the COVID-19 declaration under Section 564(b)(1) of the Act, 21 U.S.C. section 360bbb-3(b)(1), unless the authorization is terminated or revoked.     Resp Syncytial Virus by PCR NEGATIVE NEGATIVE Final    Comment: (NOTE) Fact Sheet for Patients: BloggerCourse.com  Fact Sheet for Healthcare Providers: SeriousBroker.it  This test is not yet approved or cleared by the [place] FDA and has been authorized for detection and/or diagnosis of  SARS-CoV-2 by FDA under an Emergency Use Authorization (EUA). This EUA will remain in effect (meaning this test can be used) for the duration of the COVID-19 declaration under Section 564(b)(1) of the Act, 21 U.S.C. section 360bbb-3(b)(1), unless the authorization is terminated or revoked.  Performed at [facility], [address] [place], [place] [address]   Culture, blood (routine x 2)     Status: None (Preliminary result)   Collection Time: 07/26/23  2:44 AM   Specimen: Left Antecubital; Blood  Result Value Ref Range Status   Specimen Description LEFT ANTECUBITAL  Final   Special Requests   Final    BOTTLES DRAWN AEROBIC AND ANAEROBIC Blood Culture adequate volume   Culture   Final    NO GROWTH 2 DAYS Performed at [facility], [address] [place], [place] [address]    Report Status  PENDING  Incomplete  Culture, blood (Routine X 2) w Reflex to ID Panel     Status: None (Preliminary result)   Collection Time: 07/26/23  5:25 AM   Specimen: BLOOD LEFT FOREARM  Result Value Ref Range Status   Specimen Description   Final    BLOOD LEFT FOREARM BOTTLES DRAWN AEROBIC AND ANAEROBIC   Special Requests   Final    Blood Culture results may not be optimal due to an excessive volume of blood received in culture bottles   Culture   Final    NO GROWTH 2 DAYS Performed at [facility], [address], [place], [place] [address]    Report Status PENDING  Incomplete  MRSA Next Gen by PCR, Nasal     Status: None   Collection Time: 07/26/23  5:35 AM   Specimen: Nasal Mucosa; Nasal Swab  Result Value Ref Range Status   MRSA by PCR Next Gen NOT DETECTED NOT DETECTED Final    Comment: (NOTE) The GeneXpert MRSA Assay (FDA approved for NASAL specimens only), is one component of a comprehensive MRSA colonization surveillance program. It is not intended to diagnose MRSA infection nor to guide or monitor treatment for MRSA infections. Test performance is not FDA approved in patients less than 53  years old. Performed at [facility], [address], [place], [place] [address]   Expectorated Sputum Assessment w Gram Stain, Rflx to Resp Cult     Status: None   Collection Time: 07/26/23 11:38 AM   Specimen: Sputum  Result Value Ref Range Status   Specimen Description SPUTUM  Final   Special Requests NONE  Final   Sputum evaluation   Final    THIS SPECIMEN IS ACCEPTABLE FOR SPUTUM CULTURE Performed at [facility], [address], [place], [place] [address]    Report Status 07/26/2023 FINAL  Final  Culture, Respiratory w Gram Stain     Status: None (Preliminary result)   Collection Time: 07/26/23 11:38 AM   Specimen: SPU  Result Value Ref Range Status   Specimen Description   Final    SPUTUM Performed at [facility], [address], [place], [place] [address]    Special Requests   Final    NONE Reflexed from [id number] Performed at [facility], [address] [place], [place] [address]    Gram Stain   Final    ABUNDANT SQUAMOUS EPITHELIAL CELLS PRESENT NO WBC SEEN RARE GRAM POSITIVE COCCI IN PAIRS RARE BUDDING YEAST SEEN RARE GRAM NEGATIVE RODS    Culture   Final    CULTURE REINCUBATED FOR BETTER GROWTH Performed at [facility], [address], [place], [place] [address]    Report Status PENDING  Incomplete     Scheduled Meds:  arformoterol  15 mcg Nebulization BID   budesonide (PULMICORT) nebulizer solution  0.5 mg Nebulization BID   Chlorhexidine Gluconate Cloth  6 each Topical Daily   citalopram  20 mg Oral Daily   feeding supplement  237 mL Oral TID BM   furosemide  20 mg Intravenous QPM   guaiFENesin  600 mg Oral BID   heparin  5,000 Units Subcutaneous Q8H   ipratropium-albuterol  3 mL Nebulization Q6H   melatonin  6 mg Oral QHS   methylPREDNISolone (SOLU-MEDROL) injection  60 mg Intravenous Q12H   multivitamin with minerals  1 tablet Oral Daily   nicotine  14 mg Transdermal Daily   Continuous Infusions:  ceFEPime (MAXIPIME) IV Stopped (07/28/23  0526)  lactated ringers 10 mL/hr at 07/28/23 1101    Procedures/Studies: DG Chest Port 1 View  Result Date: 07/26/2023 CLINICAL DATA:  Cough, hypoxia, COPD EXAM: PORTABLE CHEST 1 VIEW COMPARISON:  Radiographs and CT from 07/17/2023 FINDINGS: Bilateral patchy airspace and interstitial opacities. Chronic interstitial coarsening. No pleural effusion or pneumothorax. Stable cardiomediastinal silhouette. Left reverse TSA. IMPRESSION: Patchy airspace and interstitial opacities, increased from 07/17/2023 and favoring multifocal pneumonia. Electronically Signed   By: Minerva Fester M.D.   On: 07/26/2023 02:50   CT Angio Chest PE W and/or Wo Contrast  Result Date: 07/17/2023 CLINICAL DATA:  Shortness of breath EXAM: CT ANGIOGRAPHY CHEST WITH CONTRAST TECHNIQUE: Multidetector CT imaging of the chest was performed using the standard protocol during bolus administration of intravenous contrast. Multiplanar CT image reconstructions and MIPs were obtained to evaluate the vascular anatomy. RADIATION DOSE REDUCTION: This exam was performed according to the departmental dose-optimization program which includes automated exposure control, adjustment of the mA and/or kV according to patient size and/or use of iterative reconstruction technique. CONTRAST:  75mL OMNIPAQUE IOHEXOL 350 MG/ML SOLN COMPARISON:  None Available. FINDINGS: Cardiovascular: No evidence of pulmonary embolus. Evaluation of the segmental subsegmental pulmonary arteries is somewhat limited due to respiratory motion artifact. Normal heart size. No pericardial effusion. Normal caliber thoracic aorta with mild atherosclerotic disease. Moderate coronary artery calcifications. Mediastinum/Nodes: Esophagus thyroid are unremarkable. No enlarged lymph nodes seen in the chest. Lungs/Pleura: Central airways are patent. Bilateral patchy ground-glass opacities with more focal areas of consolidation and areas of interstitial thickening. No pleural effusion or  pneumothorax. Upper Abdomen: Indeterminate nodule of the right adrenal gland measuring 1.6 cm. No acute abnormality. Musculoskeletal: Left total shoulder arthroplasty. Moderate compression deformity of T12, likely chronic. Sclerosis of the inferior sternum adjacent to the costochondral joints and sclerosis of the bilateral first sternocostal joints, likely degenerative. Review of the MIP images confirms the above findings. IMPRESSION: 1. No evidence of pulmonary embolus. Evaluation of the segmental and subsegmental pulmonary arteries is somewhat limited due to respiratory motion artifact. 2. Bilateral patchy ground-glass opacities with more focal areas of consolidation and areas of interstitial thickening, likely resolving sequela of prior multifocal pneumonia. Recommend follow-up chest CT in 3 months to ensure continued resolution. 3. Indeterminate nodule of the right adrenal gland measuring 1.6 cm. Recommend further evaluation with nonemergent adrenal protocol CT. 4. Coronary artery calcifications and aortic Atherosclerosis (ICD10-I70.0). Electronically Signed   By: Allegra Lai M.D.   On: 07/17/2023 16:34   DG Chest Port 1 View  Result Date: 07/17/2023 CLINICAL DATA:  Shortness of breath.  Recent diagnosis of pneumonia. EXAM: PORTABLE CHEST 1 VIEW COMPARISON:  July 08, 2023 FINDINGS: Cardiomediastinal silhouette is normal. Mediastinal contours appear intact. Compared to the prior radiograph, there is partial resolution of the patchy airspace opacities throughout both lungs, right greater than left. Significant residual remains. Osseous structures are without acute abnormality. Soft tissues are grossly normal. IMPRESSION: Partial resolution of the patchy airspace opacities throughout both lungs, right greater than left. Significant residual remains. Electronically Signed   By: Ted Mcalpine M.D.   On: 07/17/2023 13:30   DG Swallowing Func-Speech Pathology  Result Date: 07/12/2023 Table formatting  from the original result was not included. Modified Barium Swallow Study Patient Details Name: SERIANNA MINCK MRN: [id number] Date of Birth: [birth year] Today's Date: 07/12/2023 HPI/PMH: History of Present Illness Tamryn Scull Knauff is a 62 yo female who presented to [facility] ED 8/16 after being found at home unresponsive. Per note, pt's son administered "  at home narcan", which is vinegar, while pt was unresponsive. EMS administered Narcan with improvement in mental status. In ED, was hypothermic, tachycardic, SpO2 89% on NRB. UDS + for cocaine, BZD, and THC. CXR with asymmetric bibasilar interstitial and airspace infiltrates c/f multifocal PNA/aspiration. On 8/17, pt with persistent respiratory distress with escalating O2 requirements to HFNC and eventually BiPAP and worsening CXR with rapid progression of bilateral heterogenous consolidative airspace disease in LUL and R lung. Transferred to [facility] 8/17. Required ETT 8/20-25. FEES 8/26 recommended nectar thick liquids. Extubated to HHFNC 55L. PMH includes chronic pain, anxiety/depression, panic disorders, suicide attempt, polysubstance abuse Clinical Impression Pt demonstrates improvement in airway protection. Swallow initiation still slightly late resulting in consistent trace penetration with ejection over multiple swallows. Pt had one instance of aspiration when trying to take a large pill; she gagged while holding glottic closure and had mild aspiration which she sensed and ejected. Advised large pills whole in puree. Overall sensation and respiratory endurance have improved. Pt may resume a regular diet and thin liquids. No SLP f/u needed will sign off. Factors that may increase risk of adverse event in presence of aspiration Rubye Oaks [employer]ance Coots 2021): No data recorded Recommendations/Plan: Swallowing Evaluation Recommendations Swallowing Evaluation Recommendations Recommendations: PO diet PO Diet Recommendation: Regular; Thin liquids (Level 0) Liquid Administration via: Cup;  Straw Medication Administration: Whole meds with puree Swallowing strategies  : Slow rate Postural changes: Position pt fully upright for meals Oral care recommendations: Oral care BID (2x/day) Treatment Plan Treatment Plan Treatment recommendations: No treatment recommended at this time Follow-up recommendations: No SLP follow up Recommendations Recommendations for follow up therapy are one component of a multi-disciplinary discharge planning process, led by the attending physician.  Recommendations may be updated based on patient status, additional functional criteria and insurance authorization. Assessment: Orofacial Exam: No data recorded Anatomy: Anatomy: WFL Boluses Administered: Boluses Administered Boluses Administered: Thin liquids (Level 0); Puree; Solid  Oral Impairment Domain: Oral Impairment Domain Lip Closure: No labial escape Tongue control during bolus hold: Cohesive bolus between tongue to palatal seal Bolus preparation/mastication: Timely and efficient chewing and mashing Bolus transport/lingual motion: Brisk tongue motion Oral residue: Complete oral clearance Initiation of pharyngeal swallow : Pyriform sinuses  Pharyngeal Impairment Domain: Pharyngeal Impairment Domain Soft palate elevation: No bolus between soft palate (SP)/pharyngeal wall (PW) Laryngeal elevation: Complete superior movement of thyroid cartilage with complete approximation of arytenoids to epiglottic petiole Anterior hyoid excursion: Complete anterior movement Epiglottic movement: Complete inversion Laryngeal vestibule closure: Complete, no air/contrast in laryngeal vestibule Pharyngeal stripping wave : Present - complete Pharyngeal contraction (A/P view only): N/A Pharyngoesophageal segment opening: Complete distension and complete duration, no obstruction of flow Tongue base retraction: No contrast between tongue base and posterior pharyngeal wall (PPW) Pharyngeal residue: Complete pharyngeal clearance  Esophageal Impairment  Domain: No data recorded Pill: Pill Consistency administered: Thin liquids (Level 0) Thin liquids (Level 0): Impaired (see clinical impressions) (Pt gagged and aspirated with sensation, ejected thin) Penetration/Aspiration Scale Score: Penetration/Aspiration Scale Score 1.  Material does not enter airway: Puree; Solid 3.  Material enters airway, remains ABOVE vocal cords and not ejected out: Thin liquids (Level 0) 6.  Material enters airway, passes BELOW cords then ejected out: Thin liquids (Level 0) Compensatory Strategies: No data recorded  General Information: Caregiver present: No  Diet Prior to this Study: Dysphagia 3 (mechanical soft); Mildly thick liquids (Level 2, nectar thick)   Temperature : Normal   Respiratory Status: WFL   Supplemental O2: None (Room air)  History of Recent Intubation: Yes  Behavior/Cognition: Alert; Cooperative; Pleasant mood Self-Feeding Abilities: Able to self-feed Baseline vocal quality/speech: Normal Volitional Cough: Able to elicit Volitional Swallow: Able to elicit Exam Limitations: No limitations Goal Planning: No data recorded No data recorded No data recorded No data recorded No data recorded Pain: No data recorded End of Session: Start Time:SLP Start Time (ACUTE ONLY): 0830 Stop Time: SLP Stop Time (ACUTE ONLY): 0840 Time Calculation:SLP Time Calculation (min) (ACUTE ONLY): 10 min Charges: SLP Evaluations $ SLP Speech Visit: 1 Visit SLP Evaluations $MBS Swallow: 1 Procedure SLP visit diagnosis: SLP Visit Diagnosis: Dysphagia, oropharyngeal phase (R13.12) Past Medical History: Past Medical History: Diagnosis Date  Anxiety   Chronic back pain   Depression   History of panic attacks 09/11/2014  Medical history non-contributory   Polysubstance abuse (HCC)   History of taking non-prescribed opiates, BZDs; also +cocaine and THC  Ruptured disc, thoracic   Suicide attempt (HCC) 2002 Past Surgical History: Past Surgical History: Procedure Laterality Date  CESAREAN SECTION    CLAVICLE  SURGERY    REVERSE SHOULDER ARTHROPLASTY Left 01/01/2020  Procedure: LEFT REVERSE SHOULDER ARTHROPLASTY;  Surgeon: Cammy Copa, MD;  Location: [facility] OR;  Service: Orthopedics;  Laterality: Left;  TUBAL LIGATION   DeBlois, Riley Nearing 07/12/2023, 8:57 AM  DG CHEST PORT 1 VIEW  Result Date: 07/08/2023 CLINICAL DATA:  Respiratory failure.  Pneumonia. EXAM: PORTABLE CHEST 1 VIEW COMPARISON:  July 04, 2023. FINDINGS: The heart size and mediastinal contours are within normal limits. Status post left shoulder arthroplasty. Endotracheal tube has been removed. Feeding tube is seen entering stomach. Stable bilateral lung opacities are noted, right greater than left, consistent with multifocal pneumonia. IMPRESSION: Stable bilateral lung opacities consistent with multifocal pneumonia. Electronically Signed   By: Lupita Raider M.D.   On: 07/08/2023 14:48   DG Abd 1 View  Result Date: 07/06/2023 CLINICAL DATA:  Vomiting and abdominal pain.  Tube placement. EXAM: ABDOMEN - 1 VIEW COMPARISON:  06/30/2023 FINDINGS: Soft feeding tube has its tip in the midportion of the stomach. There is no sign of small or large bowel obstruction. No evidence of free air. No abnormal calcifications or acute bone findings. IMPRESSION: Soft feeding tube tip in the midportion of the stomach. Electronically Signed   By: Paulina Fusi M.D.   On: 07/06/2023 18:23   DG Chest Port 1 View  Result Date: 07/04/2023 CLINICAL DATA:  Respiratory failure. EXAM: PORTABLE CHEST 1 VIEW COMPARISON:  Chest radiographs 07/03/2023 FINDINGS: An endotracheal tube and right upper extremity PICC are unchanged. A feeding tube courses into the abdomen with tip not imaged. The cardiomediastinal silhouette is unchanged allowing for mild rightward patient rotation on the current examination. Widespread interstitial and patchy, predominantly peripheral airspace opacities in both lungs have not significantly changed. No sizable pleural effusion or pneumothorax  is identified. IMPRESSION: Unchanged widespread bilateral lung infiltrates. Electronically Signed   By: Sebastian Ache M.D.   On: 07/04/2023 12:33   DG Chest Port 1 View  Result Date: 07/03/2023 CLINICAL DATA:  Respirator dependent EXAM: PORTABLE CHEST 1 VIEW COMPARISON:  07/01/2023 FINDINGS: Unchanged support apparatus including endotracheal tube, partially imaged enteric feeding tube, and right upper extremity PICC. Mild cardiomegaly with unchanged diffuse interstitial and heterogeneous airspace opacity, more conspicuous in the right lung. Status post left shoulder reverse arthroplasty. IMPRESSION: 1. Unchanged support apparatus including endotracheal tube, partially imaged enteric feeding tube, and right upper extremity PICC. 2. Mild cardiomegaly with unchanged diffuse interstitial and heterogeneous airspace  opacity, more conspicuous in the right lung. No new airspace opacity. Electronically Signed   By: Jearld Lesch M.D.   On: 07/03/2023 12:17   DG Chest Port 1 View  Result Date: 07/01/2023 CLINICAL DATA:  Respiratory failure EXAM: PORTABLE CHEST 1 VIEW COMPARISON:  06/29/2023. FINDINGS: Endotracheal tube tip just below thoracic inlet. Right-sided PICC tip distal SVC. Feeding tube tip below the diaphragm and off x-ray. Unremarkable cardiac silhouette. There is vascular congestion. Areas of alveolar opacity may represent pneumonitis or edema with interval stability. IMPRESSION: Stable patchy interstitial and alveolar opacities that may represent atypical pneumonia or edema. Electronically Signed   By: Layla Maw M.D.   On: 07/01/2023 09:53   DG Abd Portable 1V  Result Date: 06/30/2023 CLINICAL DATA:  Feeding tube placement EXAM: PORTABLE ABDOMEN - 1 VIEW COMPARISON:  09/04/2019 FINDINGS: The feeding tube tip is in the stomach antrum, oriented towards the pyloric channel. Visualized bowel gas pattern is unremarkable. IMPRESSION: 1. Feeding tube tip is in the stomach antrum, oriented towards the  pyloric channel. Electronically Signed   By: Gaylyn Rong M.D.   On: 06/30/2023 16:35   [place] EKG SITE RITE  Result Date: 06/30/2023 If Site Rite image not attached, placement could not be confirmed due to current cardiac rhythm.  DG CHEST PORT 1 VIEW  Result Date: 06/29/2023 CLINICAL DATA:  Encounter for intubation. EXAM: PORTABLE CHEST 1 VIEW COMPARISON:  June 29, 2023 FINDINGS: Endotracheal tube terminates 4.7 cm above the carina. Enteric catheter folds on itself within the gastric body/cardia. No evidence of pneumothorax. Diffuse bilateral patchy airspace consolidation, similar to patient's prior radiographs. IMPRESSION: 1. Endotracheal tube terminates 4.7 cm above the carina. 2. Enteric catheter folds on itself within the gastric body/cardia. 3. Diffuse bilateral patchy airspace consolidation, similar to patient's prior radiographs. Electronically Signed   By: Ted Mcalpine M.D.   On: 06/29/2023 12:43   DG CHEST PORT 1 VIEW  Result Date: 06/29/2023 CLINICAL DATA:  62 year old female with history of shortness of breath and hypoxia. EXAM: PORTABLE CHEST 1 VIEW COMPARISON:  Chest x-ray 06/28/2023. FINDINGS: Widespread interstitial prominence, peribronchial cuffing, and patchy multifocal airspace consolidation scattered asymmetrically throughout the lungs bilaterally (right greater than left) with relative sparing of the left upper lobe. Overall, aeration appears slightly improved compared to the prior study. No definite pleural effusions. No pneumothorax. Pulmonary vasculature does not appear engorged. Heart size is normal. The patient is rotated to the right on today's exam, resulting in distortion of the mediastinal contours and reduced diagnostic sensitivity and specificity for mediastinal pathology. Status post left shoulder arthroplasty. IMPRESSION: 1. Severe multilobar bilateral pneumonia redemonstrated (right greater than left) with slightly improved aeration compared to the prior  study. 2. Previously noted right-sided pleural effusion has resolved. 3. Aortic atherosclerosis. Electronically Signed   By: Trudie Reed M.D.   On: 06/29/2023 07:28   DG CHEST PORT 1 VIEW  Result Date: 06/28/2023 CLINICAL DATA:  Hypoxia EXAM: PORTABLE CHEST 1 VIEW COMPARISON:  Chest x-ray 06/27/2023 FINDINGS: Cardiomediastinal silhouette is stable. Bilateral multifocal airspace opacities have not significantly changed. Small right pleural effusion persists. There is no pneumothorax. The osseous structures are stable. IMPRESSION: Unchanged bilateral multifocal airspace opacities and small right pleural effusion. Electronically Signed   By: Darliss Cheney M.D.   On: 06/28/2023 16:39    Catarina Hartshorn, DO  [facility]  If 7PM-7AM, please contact night-coverage [email or web address] Password [email or web address] 07/28/2023, 1:02 PM   LOS: 2 days

## 2023-07-28 NOTE — Plan of Care (Signed)
  Problem: Education: Goal: Knowledge of disease or condition will improve Outcome: Progressing Goal: Knowledge of the prescribed therapeutic regimen will improve Outcome: Progressing Goal: Individualized Educational Video(s) Outcome: Progressing   Problem: Activity: Goal: Ability to tolerate increased activity will improve Outcome: Not Progressing Goal: Will verbalize the importance of balancing activity with adequate rest periods Outcome: Not Progressing   Problem: Respiratory: Goal: Ability to maintain a clear airway will improve Outcome: Progressing Goal: Levels of oxygenation will improve Outcome: Progressing Goal: Ability to maintain adequate ventilation will improve Outcome: Progressing   Problem: Activity: Goal: Ability to tolerate increased activity will improve Outcome: Not Progressing   Problem: Clinical Measurements: Goal: Ability to maintain a body temperature in the normal range will improve Outcome: Progressing   Problem: Respiratory: Goal: Ability to maintain adequate ventilation will improve Outcome: Progressing Goal: Ability to maintain a clear airway will improve Outcome: Progressing   Problem: Education: Goal: Knowledge of General Education information will improve Description: Including pain rating scale, medication(s)/side effects and non-pharmacologic comfort measures Outcome: Progressing   Problem: Health Behavior/Discharge Planning: Goal: Ability to manage health-related needs will improve Outcome: Progressing   Problem: Clinical Measurements: Goal: Ability to maintain clinical measurements within normal limits will improve Outcome: Progressing Goal: Will remain free from infection Outcome: Progressing Goal: Diagnostic test results will improve Outcome: Progressing Goal: Respiratory complications will improve Outcome: Progressing Goal: Cardiovascular complication will be avoided Outcome: Progressing

## 2023-07-28 NOTE — Progress Notes (Signed)
Patient does not wish to wear the BIPAP again tonight. Patient only wore for an hour last night. Has been able to stay off machine all day today. Will continue to monitor, unit at bedside if needed.

## 2023-07-28 NOTE — Progress Notes (Signed)
RN informed RT that patient was requesting to come off BIPAP. Patient's WOB has decreased and her O2 sats are maintaining on 4 lpm. Will continue to monitor and will place back on BIPAP if needed.

## 2023-07-28 NOTE — Progress Notes (Signed)
Patient resting well and doing well off BIPAP at this time. Unit still on standby if needed.

## 2023-07-29 DIAGNOSIS — F1910 Other psychoactive substance abuse, uncomplicated: Secondary | ICD-10-CM

## 2023-07-29 DIAGNOSIS — J9621 Acute and chronic respiratory failure with hypoxia: Secondary | ICD-10-CM | POA: Diagnosis not present

## 2023-07-29 DIAGNOSIS — J189 Pneumonia, unspecified organism: Secondary | ICD-10-CM | POA: Diagnosis not present

## 2023-07-29 DIAGNOSIS — J441 Chronic obstructive pulmonary disease with (acute) exacerbation: Secondary | ICD-10-CM | POA: Diagnosis not present

## 2023-07-29 LAB — CBC WITH DIFFERENTIAL/PLATELET
Abs Immature Granulocytes: 0.11 10*3/uL — ABNORMAL HIGH (ref 0.00–0.07)
Basophils Absolute: 0 10*3/uL (ref 0.0–0.1)
Basophils Relative: 0 %
Eosinophils Absolute: 0 10*3/uL (ref 0.0–0.5)
Eosinophils Relative: 0 %
HCT: 35.5 % — ABNORMAL LOW (ref 36.0–46.0)
Hemoglobin: 11.3 g/dL — ABNORMAL LOW (ref 12.0–15.0)
Immature Granulocytes: 1 %
Lymphocytes Relative: 17 %
Lymphs Abs: 2.7 10*3/uL (ref 0.7–4.0)
MCH: 27.6 pg (ref 26.0–34.0)
MCHC: 31.8 g/dL (ref 30.0–36.0)
MCV: 86.8 fL (ref 80.0–100.0)
Monocytes Absolute: 1 10*3/uL (ref 0.1–1.0)
Monocytes Relative: 6 %
Neutro Abs: 12.7 10*3/uL — ABNORMAL HIGH (ref 1.7–7.7)
Neutrophils Relative %: 76 %
Platelets: 421 10*3/uL — ABNORMAL HIGH (ref 150–400)
RBC: 4.09 MIL/uL (ref 3.87–5.11)
RDW: 16.1 % — ABNORMAL HIGH (ref 11.5–15.5)
WBC: 16.5 10*3/uL — ABNORMAL HIGH (ref 4.0–10.5)
nRBC: 0 % (ref 0.0–0.2)

## 2023-07-29 LAB — CULTURE, RESPIRATORY W GRAM STAIN: Culture: NORMAL

## 2023-07-29 LAB — COMPREHENSIVE METABOLIC PANEL
ALT: 23 U/L (ref 0–44)
AST: 10 U/L — ABNORMAL LOW (ref 15–41)
Albumin: 2.9 g/dL — ABNORMAL LOW (ref 3.5–5.0)
Alkaline Phosphatase: 88 U/L (ref 38–126)
Anion gap: 10 (ref 5–15)
BUN: 16 mg/dL (ref 8–23)
CO2: 31 mmol/L (ref 22–32)
Calcium: 8.7 mg/dL — ABNORMAL LOW (ref 8.9–10.3)
Chloride: 97 mmol/L — ABNORMAL LOW (ref 98–111)
Creatinine, Ser: 0.47 mg/dL (ref 0.44–1.00)
GFR, Estimated: >60 mL/min (ref >60)
Glucose, Bld: 145 mg/dL — ABNORMAL HIGH (ref 70–99)
Potassium: 4 mmol/L (ref 3.5–5.1)
Sodium: 138 mmol/L (ref 135–145)
Total Bilirubin: 0.3 mg/dL (ref 0.3–1.2)
Total Protein: 6.7 g/dL (ref 6.5–8.1)

## 2023-07-29 LAB — MAGNESIUM: Magnesium: 2.1 mg/dL (ref 1.7–2.4)

## 2023-07-29 NOTE — Plan of Care (Signed)
  Problem: Education: Goal: Knowledge of disease or condition will improve Outcome: Progressing Goal: Knowledge of the prescribed therapeutic regimen will improve Outcome: Progressing Goal: Individualized Educational Video(s) Outcome: Progressing   Problem: Activity: Goal: Ability to tolerate increased activity will improve Outcome: Progressing Goal: Will verbalize the importance of balancing activity with adequate rest periods Outcome: Progressing   Problem: Respiratory: Goal: Ability to maintain a clear airway will improve Outcome: Progressing Goal: Levels of oxygenation will improve Outcome: Progressing Goal: Ability to maintain adequate ventilation will improve Outcome: Progressing   Problem: Activity: Goal: Ability to tolerate increased activity will improve Outcome: Not Progressing   Problem: Clinical Measurements: Goal: Ability to maintain a body temperature in the normal range will improve Outcome: Progressing   Problem: Respiratory: Goal: Ability to maintain adequate ventilation will improve Outcome: Progressing Goal: Ability to maintain a clear airway will improve Outcome: Progressing   Problem: Education: Goal: Knowledge of General Education information will improve Description: Including pain rating scale, medication(s)/side effects and non-pharmacologic comfort measures Outcome: Progressing   Problem: Clinical Measurements: Goal: Ability to maintain clinical measurements within normal limits will improve Outcome: Progressing Goal: Will remain free from infection Outcome: Progressing Goal: Diagnostic test results will improve Outcome: Progressing Goal: Respiratory complications will improve Outcome: Progressing Goal: Cardiovascular complication will be avoided Outcome: Progressing   Problem: Activity: Goal: Risk for activity intolerance will decrease Outcome: Progressing

## 2023-07-29 NOTE — Progress Notes (Signed)
PROGRESS NOTE  Casey RIVIELLO EAV:[id number] DOB: 07/11/1961 DOA: 07/26/2023 PCP: Patient, No Pcp Per  Brief History:  62 year old female with past medical history significant for chronic pain,  chronic respiratory failure, COPD, chronic tobacco use, history of taking opioids and benzodiazepines that have not been prescribed.  History of cocaine, THC use.  Pt presents today after several recent hospitalizations.  She was admitted 8/17 thru 9/2 with encephalopathy from polysubstance abuse and aspiration pneumonia and subsequently decompensated requiring intubation and transferred to [facility].  She was readmitted on 9/7 with acute respiratory failure with hypoxia, COPD exacerbation and subsequently discharged 9/9 on 2L/min supplemental oxygen.   She returns to ED 9/16 from home complaining of increasing shortness of breath not improving with home inhalers. She apparently does not have a nebulizer at home.  She had increased her oxygen to 4L/min and remained very short of breath with cough, chest congestion, fever and malaise.  She reports that she has recently stopped using recreational drugs.  She is still smoking but has apparently cut back on cigarettes.   She required NRB oxygenation but then weaned to 6L/min [place].  Her CXR was positive for multifocal areas of pneumonia.  Covid/Flu/RSV testing was negative.  She is being admitted to stepdown ICU due to known high risk for acute decompensation for acute on chronic respiratory failure secondary to multifocal pneumonia and COPD exacerbation.    Assessment/Plan: Sepsis secondary to multifocal pneumonia  - given IVF initially - following blood culture--neg to date - obtain sputum culture--flora - follow up legionella and strep pneumo testing --neg   Acute on chronic respiratory failure with hypoxia  - secondary to acute COPD exacerbation and superimposed multifocal pneumonia - chronically on 2L - Broad spectrum antibiotics for presumed  HCAP given multiple recent hospitalizations - pt has history of rapid decompensation and intubation--continues to need short duration BiPAP - BIPAP PRN order in place and patient is currently on bipap - added brovana, pulmicort   Acute COPD exacerbation  Multifocal Pneumonia  - MRSA screen negative so we can discontinue IV vancomycin therapy  - continue IV steroids: increased solumedrol 60 mg IV BID  - continue scheduled bronchodilators  - continue mucinex 600 mg BID  - continue IV cefepime per pharm D dosing for pseudomonas aeruginosa coverage  - PCT <0.10 - follow blood cultures--neg to date - added pulmicort and brovana   Leukocytosis  - secondary to pneumonia and steroids - follow CBC diff   Normocytic anemia  - Hg stable from recent testing - follow CBC daily  - transfuse for Hg<7   Tobacco Abuse - pt still smoking - will offer nicotine patch 14 mg daily  - cessation discussed   Polysubstance abuse  - follow up urine toxicology screen --positive for benzo and opiates - no drug withdrawal symptoms  - pt buys percocet off the street   Hypokalemia - check serum magnesium--2.1 - oral and IV potassium replacement ordered   Hyperglycemia / Prediabetes mellitus - suspect this is steroid induced - add on A1c --> 6.0%    Severe protein calorie malnutrition - add supplements   Elevated BPs and sinus tachycardia - possible physiologic response to IV steroids and albuterol treatments given in ED         Family Communication:  no Family at bedside   Consultants:  none   Code Status:  FULL    DVT Prophylaxis:  [place] Heparin  Procedures: As Listed in Progress Note Above   Antibiotics: Cefepime 9/16>>        Subjective: Pt feels that breathing is improving but remains sob with exertion.  Has dry cough.  Denies f/c, nv/d, abd pain  Objective: Vitals:   07/29/23 1000 07/29/23 1200 07/29/23 1205 07/29/23 1219  BP: (!) 145/76 (!) 168/85    Pulse:  75   84  Resp: (!) 26 (!) 23  (!) 26  Temp:   97.6 F (36.4 C)   TempSrc:   Oral   SpO2:  96%  97%  Weight:      Height:        Intake/Output Summary (Last 24 hours) at 07/29/2023 1350 Last data filed at 07/29/2023 0945 Gross per 24 hour  Intake 752.21 ml  Output 500 ml  Net 252.21 ml   Weight change:  Exam:  General:  Pt is alert, follows commands appropriately, not in acute distress HEENT: No icterus, No thrush, No neck mass, [place]/AT Cardiovascular: RRR, S1/S2, no rubs, no gallops Respiratory: bibasilar rales.  Bibasilar wheeze Abdomen: Soft/+BS, non tender, non distended, no guarding Extremities: No edema, No lymphangitis, No petechiae, No rashes, no synovitis   Data Reviewed: I have personally reviewed following labs and imaging studies Basic Metabolic Panel: Recent Labs  Lab 07/26/23 0244 07/26/23 0525 07/27/23 0429 07/28/23 0504 07/29/23 0501  NA 134* 135 138 138 138  K 2.9* 2.9* 3.7 4.1 4.0  CL 96* 100 100 98 97*  CO2 29 27 28 30 31   GLUCOSE 207* 271* 172* 143* 145*  BUN 9 8 9 15 16   CREATININE 0.46 0.46 0.44 0.52 0.47  CALCIUM 7.9* 7.9* 8.3* 8.8* 8.7*  MG  --  2.2 2.1 2.2 2.1   Liver Function Tests: Recent Labs  Lab 07/26/23 0525 07/27/23 0429 07/28/23 0504 07/29/23 0501  AST 9* 9* 9* 10*  ALT 20 17 21 23   ALKPHOS 87 81 88 88  BILITOT 0.4 0.3 0.2* 0.3  PROT 6.1* 5.9* 6.5 6.7  ALBUMIN 2.8* 2.5* 2.8* 2.9*   No results for input(s): "LIPASE", "AMYLASE" in the last 168 hours. No results for input(s): "AMMONIA" in the last 168 hours. Coagulation Profile: No results for input(s): "INR", "PROTIME" in the last 168 hours. CBC: Recent Labs  Lab 07/26/23 0244 07/26/23 0525 07/27/23 0429 07/28/23 0504 07/29/23 0501  WBC 15.5* 14.5* 16.6* 20.6* 16.5*  NEUTROABS 11.3* 13.2* 14.4* 17.3* 12.7*  HGB 10.7* 10.6* 9.5* 10.6* 11.3*  HCT 33.2* 33.6* 30.7* 33.1* 35.5*  MCV 86.2 88.0 88.5 87.8 86.8  PLT 497* 433* 376 460* 421*   Cardiac Enzymes: No results for  input(s): "CKTOTAL", "CKMB", "CKMBINDEX", "TROPONINI" in the last 168 hours. BNP: Invalid input(s): "POCBNP" CBG: No results for input(s): "GLUCAP" in the last 168 hours. HbA1C: No results for input(s): "HGBA1C" in the last 72 hours. Urine analysis:    Component Value Date/Time   COLORURINE YELLOW 06/26/2023 0134   APPEARANCEUR CLEAR 06/26/2023 0134   LABSPEC 1.014 06/26/2023 0134   PHURINE 5.0 06/26/2023 0134   GLUCOSEU NEGATIVE 06/26/2023 0134   HGBUR SMALL (A) 06/26/2023 0134   BILIRUBINUR NEGATIVE 06/26/2023 0134   KETONESUR NEGATIVE 06/26/2023 0134   PROTEINUR NEGATIVE 06/26/2023 0134   UROBILINOGEN 0.2 05/02/2009 1015   NITRITE NEGATIVE 06/26/2023 0134   LEUKOCYTESUR NEGATIVE 06/26/2023 0134   Sepsis Labs: @LABRCNTIP (procalcitonin:4,lacticidven:4) ) Recent Results (from the past 240 hour(s))  Resp panel by RT-PCR (RSV, Flu A&B, Covid) Anterior Nasal Swab     Status:  None   Collection Time: 07/26/23  2:35 AM   Specimen: Anterior Nasal Swab  Result Value Ref Range Status   SARS Coronavirus 2 by RT PCR NEGATIVE NEGATIVE Final    Comment: (NOTE) SARS-CoV-2 target nucleic acids are NOT DETECTED.  The SARS-CoV-2 RNA is generally detectable in upper respiratory specimens during the acute phase of infection. The lowest concentration of SARS-CoV-2 viral copies this assay can detect is 138 copies/mL. A negative result does not preclude SARS-Cov-2 infection and should not be used as the sole basis for treatment or other patient management decisions. A negative result may occur with  improper specimen collection/handling, submission of specimen other than nasopharyngeal swab, presence of viral mutation(s) within the areas targeted by this assay, and inadequate number of viral copies(<138 copies/mL). A negative result must be combined with clinical observations, patient history, and epidemiological information. The expected result is Negative.  Fact Sheet for Patients:   BloggerCourse.com  Fact Sheet for Healthcare Providers:  SeriousBroker.it  This test is no t yet approved or cleared by the [place] FDA and  has been authorized for detection and/or diagnosis of SARS-CoV-2 by FDA under an Emergency Use Authorization (EUA). This EUA will remain  in effect (meaning this test can be used) for the duration of the COVID-19 declaration under Section 564(b)(1) of the Act, 21 U.S.C.section 360bbb-3(b)(1), unless the authorization is terminated  or revoked sooner.       Influenza A by PCR NEGATIVE NEGATIVE Final   Influenza B by PCR NEGATIVE NEGATIVE Final    Comment: (NOTE) The Xpert Xpress SARS-CoV-2/FLU/RSV plus assay is intended as an aid in the diagnosis of influenza from Nasopharyngeal swab specimens and should not be used as a sole basis for treatment. Nasal washings and aspirates are unacceptable for Xpert Xpress SARS-CoV-2/FLU/RSV testing.  Fact Sheet for Patients: BloggerCourse.com  Fact Sheet for Healthcare Providers: SeriousBroker.it  This test is not yet approved or cleared by the [place] FDA and has been authorized for detection and/or diagnosis of SARS-CoV-2 by FDA under an Emergency Use Authorization (EUA). This EUA will remain in effect (meaning this test can be used) for the duration of the COVID-19 declaration under Section 564(b)(1) of the Act, 21 U.S.C. section 360bbb-3(b)(1), unless the authorization is terminated or revoked.     Resp Syncytial Virus by PCR NEGATIVE NEGATIVE Final    Comment: (NOTE) Fact Sheet for Patients: BloggerCourse.com  Fact Sheet for Healthcare Providers: SeriousBroker.it  This test is not yet approved or cleared by the [place] FDA and has been authorized for detection and/or diagnosis of SARS-CoV-2 by FDA under an Emergency Use  Authorization (EUA). This EUA will remain in effect (meaning this test can be used) for the duration of the COVID-19 declaration under Section 564(b)(1) of the Act, 21 U.S.C. section 360bbb-3(b)(1), unless the authorization is terminated or revoked.  Performed at [facility], [address], [place], [place] [address]   Culture, blood (routine x 2)     Status: None (Preliminary result)   Collection Time: 07/26/23  2:44 AM   Specimen: Left Antecubital; Blood  Result Value Ref Range Status   Specimen Description LEFT ANTECUBITAL  Final   Special Requests   Final    BOTTLES DRAWN AEROBIC AND ANAEROBIC Blood Culture adequate volume   Culture   Final    NO GROWTH 3 DAYS Performed at [facility], [address], [place], [place] [address]    Report Status PENDING  Incomplete  Culture, blood (Routine X  2) w Reflex to ID Panel     Status: None (Preliminary result)   Collection Time: 07/26/23  5:25 AM   Specimen: BLOOD LEFT FOREARM  Result Value Ref Range Status   Specimen Description   Final    BLOOD LEFT FOREARM BOTTLES DRAWN AEROBIC AND ANAEROBIC   Special Requests   Final    Blood Culture results may not be optimal due to an excessive volume of blood received in culture bottles   Culture   Final    NO GROWTH 3 DAYS Performed at [facility], [address], [place], [place] [address]    Report Status PENDING  Incomplete  MRSA Next Gen by PCR, Nasal     Status: None   Collection Time: 07/26/23  5:35 AM   Specimen: Nasal Mucosa; Nasal Swab  Result Value Ref Range Status   MRSA by PCR Next Gen NOT DETECTED NOT DETECTED Final    Comment: (NOTE) The GeneXpert MRSA Assay (FDA approved for NASAL specimens only), is one component of a comprehensive MRSA colonization surveillance program. It is not intended to diagnose MRSA infection nor to guide or monitor treatment for MRSA infections. Test performance is not FDA approved in patients less than 51 years old. Performed at [facility], [address] [place], [place] [address]   Expectorated Sputum Assessment w Gram Stain, Rflx to Resp Cult     Status: None   Collection Time: 07/26/23 11:38 AM   Specimen: Sputum  Result Value Ref Range Status   Specimen Description SPUTUM  Final   Special Requests NONE  Final   Sputum evaluation   Final    THIS SPECIMEN IS ACCEPTABLE FOR SPUTUM CULTURE Performed at [facility], [address], [place], [place] [address]    Report Status 07/26/2023 FINAL  Final  Culture, Respiratory w Gram Stain     Status: None   Collection Time: 07/26/23 11:38 AM   Specimen: SPU  Result Value Ref Range Status   Specimen Description   Final    SPUTUM Performed at [facility], [address], [place], [place] [address]    Special Requests   Final    NONE Reflexed from [id number] Performed at [facility], [address] [place], [place] [address]    Gram Stain   Final    ABUNDANT SQUAMOUS EPITHELIAL CELLS PRESENT NO WBC SEEN RARE GRAM POSITIVE COCCI IN PAIRS RARE BUDDING YEAST SEEN RARE GRAM NEGATIVE RODS    Culture   Final    Normal respiratory flora-no Staph aureus or Pseudomonas seen Performed at [facility], [address], [place], [place] [address]    Report Status 07/29/2023 FINAL  Final  Respiratory (~20 pathogens) panel by PCR     Status: None   Collection Time: 07/28/23  1:10 PM   Specimen: Nasopharyngeal Swab; Respiratory  Result Value Ref Range Status   Adenovirus NOT DETECTED NOT DETECTED Final   Coronavirus 229E NOT DETECTED NOT DETECTED Final    Comment: (NOTE) The Coronavirus on the Respiratory Panel, DOES NOT test for the novel  Coronavirus (2019 nCoV)    Coronavirus HKU1 NOT DETECTED NOT DETECTED Final   Coronavirus NL63 NOT DETECTED NOT DETECTED Final   Coronavirus OC43 NOT DETECTED NOT DETECTED Final   Metapneumovirus NOT DETECTED NOT DETECTED Final   Rhinovirus / Enterovirus NOT DETECTED NOT DETECTED Final   Influenza A NOT DETECTED NOT DETECTED Final    Influenza B NOT DETECTED NOT DETECTED Final   Parainfluenza  Virus 1 NOT DETECTED NOT DETECTED Final   Parainfluenza Virus 2 NOT DETECTED NOT DETECTED Final   Parainfluenza Virus 3 NOT DETECTED NOT DETECTED Final   Parainfluenza Virus 4 NOT DETECTED NOT DETECTED Final   Respiratory Syncytial Virus NOT DETECTED NOT DETECTED Final   Bordetella pertussis NOT DETECTED NOT DETECTED Final   Bordetella Parapertussis NOT DETECTED NOT DETECTED Final   Chlamydophila pneumoniae NOT DETECTED NOT DETECTED Final   Mycoplasma pneumoniae NOT DETECTED NOT DETECTED Final    Comment: Performed at [facility], [address] [place], [place] [address]     Scheduled Meds:  arformoterol  15 mcg Nebulization BID   budesonide (PULMICORT) nebulizer solution  0.5 mg Nebulization BID   Chlorhexidine Gluconate Cloth  6 each Topical Daily   citalopram  20 mg Oral Daily   feeding supplement  237 mL Oral TID BM   furosemide  20 mg Intravenous QPM   guaiFENesin  600 mg Oral BID   heparin  5,000 Units Subcutaneous Q8H   ipratropium-albuterol  3 mL Nebulization Q6H   melatonin  6 mg Oral QHS   methylPREDNISolone (SOLU-MEDROL) injection  60 mg Intravenous Q12H   multivitamin with minerals  1 tablet Oral Daily   nicotine  14 mg Transdermal Daily   Continuous Infusions:  ceFEPime (MAXIPIME) IV Stopped (07/29/23 0600)   lactated ringers 10 mL/hr at 07/29/23 0945    Procedures/Studies: DG Chest Port 1 View  Result Date: 07/26/2023 CLINICAL DATA:  Cough, hypoxia, COPD EXAM: PORTABLE CHEST 1 VIEW COMPARISON:  Radiographs and CT from 07/17/2023 FINDINGS: Bilateral patchy airspace and interstitial opacities. Chronic interstitial coarsening. No pleural effusion or pneumothorax. Stable cardiomediastinal silhouette. Left reverse TSA. IMPRESSION: Patchy airspace and interstitial opacities, increased from 07/17/2023 and favoring multifocal pneumonia. Electronically Signed   By: Minerva Fester M.D.   On: 07/26/2023 02:50    CT Angio Chest PE W and/or Wo Contrast  Result Date: 07/17/2023 CLINICAL DATA:  Shortness of breath EXAM: CT ANGIOGRAPHY CHEST WITH CONTRAST TECHNIQUE: Multidetector CT imaging of the chest was performed using the standard protocol during bolus administration of intravenous contrast. Multiplanar CT image reconstructions and MIPs were obtained to evaluate the vascular anatomy. RADIATION DOSE REDUCTION: This exam was performed according to the departmental dose-optimization program which includes automated exposure control, adjustment of the mA and/or kV according to patient size and/or use of iterative reconstruction technique. CONTRAST:  75mL OMNIPAQUE IOHEXOL 350 MG/ML SOLN COMPARISON:  None Available. FINDINGS: Cardiovascular: No evidence of pulmonary embolus. Evaluation of the segmental subsegmental pulmonary arteries is somewhat limited due to respiratory motion artifact. Normal heart size. No pericardial effusion. Normal caliber thoracic aorta with mild atherosclerotic disease. Moderate coronary artery calcifications. Mediastinum/Nodes: Esophagus thyroid are unremarkable. No enlarged lymph nodes seen in the chest. Lungs/Pleura: Central airways are patent. Bilateral patchy ground-glass opacities with more focal areas of consolidation and areas of interstitial thickening. No pleural effusion or pneumothorax. Upper Abdomen: Indeterminate nodule of the right adrenal gland measuring 1.6 cm. No acute abnormality. Musculoskeletal: Left total shoulder arthroplasty. Moderate compression deformity of T12, likely chronic. Sclerosis of the inferior sternum adjacent to the costochondral joints and sclerosis of the bilateral first sternocostal joints, likely degenerative. Review of the MIP images confirms the above findings. IMPRESSION: 1. No evidence of pulmonary embolus. Evaluation of the segmental and subsegmental pulmonary arteries is somewhat limited due to respiratory motion artifact. 2. Bilateral patchy  ground-glass opacities with more focal areas of consolidation and areas of interstitial thickening, likely resolving  sequela of prior multifocal pneumonia. Recommend follow-up chest CT in 3 months to ensure continued resolution. 3. Indeterminate nodule of the right adrenal gland measuring 1.6 cm. Recommend further evaluation with nonemergent adrenal protocol CT. 4. Coronary artery calcifications and aortic Atherosclerosis (ICD10-I70.0). Electronically Signed   By: Allegra Lai M.D.   On: 07/17/2023 16:34   DG Chest Port 1 View  Result Date: 07/17/2023 CLINICAL DATA:  Shortness of breath.  Recent diagnosis of pneumonia. EXAM: PORTABLE CHEST 1 VIEW COMPARISON:  July 08, 2023 FINDINGS: Cardiomediastinal silhouette is normal. Mediastinal contours appear intact. Compared to the prior radiograph, there is partial resolution of the patchy airspace opacities throughout both lungs, right greater than left. Significant residual remains. Osseous structures are without acute abnormality. Soft tissues are grossly normal. IMPRESSION: Partial resolution of the patchy airspace opacities throughout both lungs, right greater than left. Significant residual remains. Electronically Signed   By: Ted Mcalpine M.D.   On: 07/17/2023 13:30   DG Swallowing Func-Speech Pathology  Result Date: 07/12/2023 Table formatting from the original result was not included. Modified Barium Swallow Study Patient Details Name: Casey [name] MRN: [id number] Date of Birth: 1961-07-17 Today's Date: 07/12/2023 HPI/PMH: History of Present Illness Alvina Borgen Bogus is a 62 yo female who presented to [facility] ED 8/16 after being found at home unresponsive. Per note, pt's son administered "at home narcan", which is vinegar, while pt was unresponsive. EMS administered Narcan with improvement in mental status. In ED, was hypothermic, tachycardic, SpO2 89% on NRB. UDS + for cocaine, BZD, and THC. CXR with asymmetric bibasilar interstitial and airspace  infiltrates c/f multifocal PNA/aspiration. On 8/17, pt with persistent respiratory distress with escalating O2 requirements to HFNC and eventually BiPAP and worsening CXR with rapid progression of bilateral heterogenous consolidative airspace disease in LUL and R lung. Transferred to [facility] 8/17. Required ETT 8/20-25. FEES 8/26 recommended nectar thick liquids. Extubated to HHFNC 55L. PMH includes chronic pain, anxiety/depression, panic disorders, suicide attempt, polysubstance abuse Clinical Impression Pt demonstrates improvement in airway protection. Swallow initiation still slightly late resulting in consistent trace penetration with ejection over multiple swallows. Pt had one instance of aspiration when trying to take a large pill; she gagged while holding glottic closure and had mild aspiration which she sensed and ejected. Advised large pills whole in puree. Overall sensation and respiratory endurance have improved. Pt may resume a regular diet and thin liquids. No SLP f/u needed will sign off. Factors that may increase risk of adverse event in presence of aspiration Rubye Oaks [employer] Clearance Coots 2021): No data recorded Recommendations/Plan: Swallowing Evaluation Recommendations Swallowing Evaluation Recommendations Recommendations: PO diet PO Diet Recommendation: Regular; Thin liquids (Level 0) Liquid Administration via: Cup; Straw Medication Administration: Whole meds with puree Swallowing strategies  : Slow rate Postural changes: Position pt fully upright for meals Oral care recommendations: Oral care BID (2x/day) Treatment Plan Treatment Plan Treatment recommendations: No treatment recommended at this time Follow-up recommendations: No SLP follow up Recommendations Recommendations for follow up therapy are one component of a multi-disciplinary discharge planning process, led by the attending physician.  Recommendations may be updated based on patient status, additional functional criteria and insurance authorization.  Assessment: Orofacial Exam: No data recorded Anatomy: Anatomy: WFL Boluses Administered: Boluses Administered Boluses Administered: Thin liquids (Level 0); Puree; Solid  Oral Impairment Domain: Oral Impairment Domain Lip Closure: No labial escape Tongue control during bolus hold: Cohesive bolus between tongue to palatal seal Bolus preparation/mastication: Timely and efficient chewing and mashing Bolus transport/lingual motion: Brisk  tongue motion Oral residue: Complete oral clearance Initiation of pharyngeal swallow : Pyriform sinuses  Pharyngeal Impairment Domain: Pharyngeal Impairment Domain Soft palate elevation: No bolus between soft palate (SP)/pharyngeal wall (PW) Laryngeal elevation: Complete superior movement of thyroid cartilage with complete approximation of arytenoids to epiglottic petiole Anterior hyoid excursion: Complete anterior movement Epiglottic movement: Complete inversion Laryngeal vestibule closure: Complete, no air/contrast in laryngeal vestibule Pharyngeal stripping wave : Present - complete Pharyngeal contraction (A/P view only): N/A Pharyngoesophageal segment opening: Complete distension and complete duration, no obstruction of flow Tongue base retraction: No contrast between tongue base and posterior pharyngeal wall (PPW) Pharyngeal residue: Complete pharyngeal clearance  Esophageal Impairment Domain: No data recorded Pill: Pill Consistency administered: Thin liquids (Level 0) Thin liquids (Level 0): Impaired (see clinical impressions) (Pt gagged and aspirated with sensation, ejected thin) Penetration/Aspiration Scale Score: Penetration/Aspiration Scale Score 1.  Material does not enter airway: Puree; Solid 3.  Material enters airway, remains ABOVE vocal cords and not ejected out: Thin liquids (Level 0) 6.  Material enters airway, passes BELOW cords then ejected out: Thin liquids (Level 0) Compensatory Strategies: No data recorded  General Information: Caregiver present: No  Diet Prior to  this Study: Dysphagia 3 (mechanical soft); Mildly thick liquids (Level 2, nectar thick)   Temperature : Normal   Respiratory Status: WFL   Supplemental O2: None (Room air)   History of Recent Intubation: Yes  Behavior/Cognition: Alert; Cooperative; Pleasant mood Self-Feeding Abilities: Able to self-feed Baseline vocal quality/speech: Normal Volitional Cough: Able to elicit Volitional Swallow: Able to elicit Exam Limitations: No limitations Goal Planning: No data recorded No data recorded No data recorded No data recorded No data recorded Pain: No data recorded End of Session: Start Time:SLP Start Time (ACUTE ONLY): 0830 Stop Time: SLP Stop Time (ACUTE ONLY): 0840 Time Calculation:SLP Time Calculation (min) (ACUTE ONLY): 10 min Charges: SLP Evaluations $ SLP Speech Visit: 1 Visit SLP Evaluations $MBS Swallow: 1 Procedure SLP visit diagnosis: SLP Visit Diagnosis: Dysphagia, oropharyngeal phase (R13.12) Past Medical History: Past Medical History: Diagnosis Date  Anxiety   Chronic back pain   Depression   History of panic attacks 09/11/2014  Medical history non-contributory   Polysubstance abuse (HCC)   History of taking non-prescribed opiates, BZDs; also +cocaine and THC  Ruptured disc, thoracic   Suicide attempt (HCC) 2002 Past Surgical History: Past Surgical History: Procedure Laterality Date  CESAREAN SECTION    CLAVICLE SURGERY    REVERSE SHOULDER ARTHROPLASTY Left 01/01/2020  Procedure: LEFT REVERSE SHOULDER ARTHROPLASTY;  Surgeon: Cammy Copa, MD;  Location: [facility] OR;  Service: Orthopedics;  Laterality: Left;  TUBAL LIGATION   DeBlois, Riley Nearing 07/12/2023, 8:57 AM  DG CHEST PORT 1 VIEW  Result Date: 07/08/2023 CLINICAL DATA:  Respiratory failure.  Pneumonia. EXAM: PORTABLE CHEST 1 VIEW COMPARISON:  July 04, 2023. FINDINGS: The heart size and mediastinal contours are within normal limits. Status post left shoulder arthroplasty. Endotracheal tube has been removed. Feeding tube is seen entering  stomach. Stable bilateral lung opacities are noted, right greater than left, consistent with multifocal pneumonia. IMPRESSION: Stable bilateral lung opacities consistent with multifocal pneumonia. Electronically Signed   By: Lupita Raider M.D.   On: 07/08/2023 14:48   DG Abd 1 View  Result Date: 07/06/2023 CLINICAL DATA:  Vomiting and abdominal pain.  Tube placement. EXAM: ABDOMEN - 1 VIEW COMPARISON:  06/30/2023 FINDINGS: Soft feeding tube has its tip in the midportion of the stomach. There is no sign of small or large bowel  obstruction. No evidence of free air. No abnormal calcifications or acute bone findings. IMPRESSION: Soft feeding tube tip in the midportion of the stomach. Electronically Signed   By: Paulina Fusi M.D.   On: 07/06/2023 18:23   DG Chest Port 1 View  Result Date: 07/04/2023 CLINICAL DATA:  Respiratory failure. EXAM: PORTABLE CHEST 1 VIEW COMPARISON:  Chest radiographs 07/03/2023 FINDINGS: An endotracheal tube and right upper extremity PICC are unchanged. A feeding tube courses into the abdomen with tip not imaged. The cardiomediastinal silhouette is unchanged allowing for mild rightward patient rotation on the current examination. Widespread interstitial and patchy, predominantly peripheral airspace opacities in both lungs have not significantly changed. No sizable pleural effusion or pneumothorax is identified. IMPRESSION: Unchanged widespread bilateral lung infiltrates. Electronically Signed   By: Sebastian Ache M.D.   On: 07/04/2023 12:33   DG Chest Port 1 View  Result Date: 07/03/2023 CLINICAL DATA:  Respirator dependent EXAM: PORTABLE CHEST 1 VIEW COMPARISON:  07/01/2023 FINDINGS: Unchanged support apparatus including endotracheal tube, partially imaged enteric feeding tube, and right upper extremity PICC. Mild cardiomegaly with unchanged diffuse interstitial and heterogeneous airspace opacity, more conspicuous in the right lung. Status post left shoulder reverse arthroplasty.  IMPRESSION: 1. Unchanged support apparatus including endotracheal tube, partially imaged enteric feeding tube, and right upper extremity PICC. 2. Mild cardiomegaly with unchanged diffuse interstitial and heterogeneous airspace opacity, more conspicuous in the right lung. No new airspace opacity. Electronically Signed   By: Jearld Lesch M.D.   On: 07/03/2023 12:17   DG Chest Port 1 View  Result Date: 07/01/2023 CLINICAL DATA:  Respiratory failure EXAM: PORTABLE CHEST 1 VIEW COMPARISON:  06/29/2023. FINDINGS: Endotracheal tube tip just below thoracic inlet. Right-sided PICC tip distal SVC. Feeding tube tip below the diaphragm and off x-ray. Unremarkable cardiac silhouette. There is vascular congestion. Areas of alveolar opacity may represent pneumonitis or edema with interval stability. IMPRESSION: Stable patchy interstitial and alveolar opacities that may represent atypical pneumonia or edema. Electronically Signed   By: Layla Maw M.D.   On: 07/01/2023 09:53   DG Abd Portable 1V  Result Date: 06/30/2023 CLINICAL DATA:  Feeding tube placement EXAM: PORTABLE ABDOMEN - 1 VIEW COMPARISON:  09/04/2019 FINDINGS: The feeding tube tip is in the stomach antrum, oriented towards the pyloric channel. Visualized bowel gas pattern is unremarkable. IMPRESSION: 1. Feeding tube tip is in the stomach antrum, oriented towards the pyloric channel. Electronically Signed   By: Gaylyn Rong M.D.   On: 06/30/2023 16:35   [place] EKG SITE RITE  Result Date: 06/30/2023 If Site Rite image not attached, placement could not be confirmed due to current cardiac rhythm.   Catarina Hartshorn, DO  [facility]  If 7PM-7AM, please contact night-coverage [email or web address] Password [email or web address] 07/29/2023, 1:50 PM   LOS: 3 days

## 2023-07-30 DIAGNOSIS — J441 Chronic obstructive pulmonary disease with (acute) exacerbation: Secondary | ICD-10-CM | POA: Diagnosis not present

## 2023-07-30 DIAGNOSIS — J9621 Acute and chronic respiratory failure with hypoxia: Secondary | ICD-10-CM | POA: Diagnosis not present

## 2023-07-30 DIAGNOSIS — J189 Pneumonia, unspecified organism: Secondary | ICD-10-CM | POA: Diagnosis not present

## 2023-07-30 DIAGNOSIS — A419 Sepsis, unspecified organism: Secondary | ICD-10-CM | POA: Diagnosis not present

## 2023-07-30 LAB — COMPREHENSIVE METABOLIC PANEL
ALT: 24 U/L (ref 0–44)
AST: 10 U/L — ABNORMAL LOW (ref 15–41)
Albumin: 3.1 g/dL — ABNORMAL LOW (ref 3.5–5.0)
Alkaline Phosphatase: 88 U/L (ref 38–126)
Anion gap: 13 (ref 5–15)
BUN: 19 mg/dL (ref 8–23)
CO2: 27 mmol/L (ref 22–32)
Calcium: 8.9 mg/dL (ref 8.9–10.3)
Chloride: 96 mmol/L — ABNORMAL LOW (ref 98–111)
Creatinine, Ser: 0.5 mg/dL (ref 0.44–1.00)
GFR, Estimated: >60 mL/min (ref >60)
Glucose, Bld: 177 mg/dL — ABNORMAL HIGH (ref 70–99)
Potassium: 4 mmol/L (ref 3.5–5.1)
Sodium: 136 mmol/L (ref 135–145)
Total Bilirubin: 0.4 mg/dL (ref 0.3–1.2)
Total Protein: 6.8 g/dL (ref 6.5–8.1)

## 2023-07-30 LAB — CBC WITH DIFFERENTIAL/PLATELET
Abs Immature Granulocytes: 0.09 10*3/uL — ABNORMAL HIGH (ref 0.00–0.07)
Basophils Absolute: 0 10*3/uL (ref 0.0–0.1)
Basophils Relative: 0 %
Eosinophils Absolute: 0 10*3/uL (ref 0.0–0.5)
Eosinophils Relative: 0 %
HCT: 38.8 % (ref 36.0–46.0)
Hemoglobin: 12.2 g/dL (ref 12.0–15.0)
Immature Granulocytes: 1 %
Lymphocytes Relative: 12 %
Lymphs Abs: 1.7 10*3/uL (ref 0.7–4.0)
MCH: 27.1 pg (ref 26.0–34.0)
MCHC: 31.4 g/dL (ref 30.0–36.0)
MCV: 86.2 fL (ref 80.0–100.0)
Monocytes Absolute: 0.4 10*3/uL (ref 0.1–1.0)
Monocytes Relative: 3 %
Neutro Abs: 12.7 10*3/uL — ABNORMAL HIGH (ref 1.7–7.7)
Neutrophils Relative %: 84 %
Platelets: 432 10*3/uL — ABNORMAL HIGH (ref 150–400)
RBC: 4.5 MIL/uL (ref 3.87–5.11)
RDW: 15.9 % — ABNORMAL HIGH (ref 11.5–15.5)
WBC: 14.9 10*3/uL — ABNORMAL HIGH (ref 4.0–10.5)
nRBC: 0 % (ref 0.0–0.2)

## 2023-07-30 LAB — MAGNESIUM: Magnesium: 2.2 mg/dL (ref 1.7–2.4)

## 2023-07-30 MED ORDER — REVEFENACIN 175 MCG/3ML IN SOLN
175.0000 ug | Freq: Every day | RESPIRATORY_TRACT | Status: DC
  Administered 2023-07-31: 175 ug via RESPIRATORY_TRACT
  Filled 2023-07-30: qty 3

## 2023-07-30 MED ORDER — CEFADROXIL 500 MG PO CAPS
1000.0000 mg | ORAL_CAPSULE | Freq: Two times a day (BID) | ORAL | Status: DC
  Administered 2023-07-30 – 2023-07-31 (×2): 1000 mg via ORAL
  Filled 2023-07-30 (×6): qty 2

## 2023-07-30 MED ORDER — ALBUTEROL SULFATE (2.5 MG/3ML) 0.083% IN NEBU
2.5000 mg | INHALATION_SOLUTION | Freq: Four times a day (QID) | RESPIRATORY_TRACT | Status: DC
  Administered 2023-07-31 (×2): 2.5 mg via RESPIRATORY_TRACT
  Filled 2023-07-30 (×2): qty 3

## 2023-07-30 MED ORDER — FUROSEMIDE 40 MG PO TABS
40.0000 mg | ORAL_TABLET | Freq: Every day | ORAL | Status: DC
  Administered 2023-07-30 – 2023-07-31 (×2): 40 mg via ORAL
  Filled 2023-07-30 (×2): qty 1

## 2023-07-30 MED ORDER — CLONAZEPAM 0.5 MG PO TABS
0.5000 mg | ORAL_TABLET | Freq: Two times a day (BID) | ORAL | Status: DC
  Administered 2023-07-30 – 2023-07-31 (×3): 0.5 mg via ORAL
  Filled 2023-07-30 (×3): qty 1

## 2023-07-30 MED ORDER — AZITHROMYCIN 250 MG PO TABS
500.0000 mg | ORAL_TABLET | Freq: Every day | ORAL | Status: DC
  Administered 2023-07-30 – 2023-07-31 (×2): 500 mg via ORAL
  Filled 2023-07-30 (×2): qty 2

## 2023-07-30 NOTE — Progress Notes (Signed)
Patient resting comfortably on 2L [place]. Tolerated breathing treatment well. No respiratory distress noted. BIPAP not indicated at this time. BIPAP is at bedside on standby. RT will monitor as needed.

## 2023-07-30 NOTE — Progress Notes (Signed)
Upon arriving to given breathing treatment patient's O2 sat was 90-92%. After troubleshooting it seems that patient became tangled in O2 hose and it became disconnected from flowmeter. Untangled hose from around patient and reconnected to O2 flowmeter at 2 lpm.

## 2023-07-30 NOTE — Progress Notes (Signed)
PROGRESS NOTE  Casey [name] LKG:[id number] DOB: [birth year] DOA: 07/26/2023 PCP: Patient, No Pcp Per  Brief History:  62 year old female with past medical history significant for chronic pain,  chronic respiratory failure, COPD, chronic tobacco use, history of taking opioids and benzodiazepines that have not been prescribed.  History of cocaine, THC use.  Pt presents today after several recent hospitalizations.  She was admitted 8/17 thru 9/2 with encephalopathy from polysubstance abuse and aspiration pneumonia and subsequently decompensated requiring intubation and transferred to [facility].  She was readmitted on 9/7 with acute respiratory failure with hypoxia, COPD exacerbation and subsequently discharged 9/9 on 2L/min supplemental oxygen.   She returns to ED 9/16 from home complaining of increasing shortness of breath not improving with home inhalers. She apparently does not have a nebulizer at home.  She had increased her oxygen to 4L/min and remained very short of breath with cough, chest congestion, fever and malaise.  She reports that she has recently stopped using recreational drugs.  She is still smoking but has apparently cut back on cigarettes.   She required NRB oxygenation but then weaned to 6L/min [place].  Her CXR was positive for multifocal areas of pneumonia.  Covid/Flu/RSV testing was negative.  She is being admitted to stepdown ICU due to known high risk for acute decompensation for acute on chronic respiratory failure secondary to multifocal pneumonia and COPD exacerbation.    Assessment/Plan: Sepsis secondary to multifocal pneumonia  - given IVF initially>>saline locked - following blood culture--neg to date - obtain sputum culture--flora - follow up legionella and strep pneumo testing --neg   Acute on chronic respiratory failure with hypoxia  - secondary to acute COPD exacerbation and superimposed multifocal pneumonia - chronically on 2L - Broad spectrum antibiotics  for presumed HCAP given multiple recent hospitalizations - pt has history of rapid decompensation and intubation--continues to need short duration BiPAP - BIPAP PRN order in place and patient is currently on bipap - added brovana, pulmicort -add yupelri   Acute COPD exacerbation  Multifocal Pneumonia  - MRSA screen negative so we can discontinue IV vancomycin therapy  - continue IV steroids: increased solumedrol 60 mg IV BID  - continue scheduled bronchodilators  - continue mucinex 600 mg BID  - continue IV cefepime>>po cefadroxil - PCT <0.10 - follow blood cultures--neg to date - added pulmicort and brovana -add yupelri -add klonopin for anxiety   Leukocytosis  - secondary to pneumonia and steroids - follow CBC diff   Normocytic anemia  - Hg stable from recent testing - follow CBC daily  - transfuse for Hg<7   Tobacco Abuse - pt still smoking - nicotine patch 14 mg daily  - cessation discussed   Polysubstance abuse  - follow up urine toxicology screen --positive for benzo and opiates - no drug withdrawal symptoms  - pt buys percocet off the street   Hypokalemia - check serum magnesium--2.1 - oral and IV potassium replacement ordered   Hyperglycemia / Prediabetes mellitus - suspect this is steroid induced - add on A1c --> 6.0%    Severe protein calorie malnutrition - add supplements   Elevated BPs and sinus tachycardia - possible physiologic response to IV steroids and albuterol treatments given in ED         Family Communication:  no Family at bedside   Consultants:  none   Code Status:  FULL    DVT Prophylaxis:  [place] Heparin  Procedures: As Listed in Progress Note Above   Antibiotics: Cefepime 9/16>>9/20 Cefadroxil 9/20>> Azithro 9/20>>       Subjective:  Pt is breathing better, but has sob with minimal exertion.  Denies cp, n/v/d.  No cp, abd pain Objective: Vitals:   07/30/23 0800 07/30/23 0810 07/30/23 0813 07/30/23 0819  BP:  (!) 152/64     Pulse: 82     Resp: (!) 33     Temp:      TempSrc:      SpO2: 96% 98% 98% 100%  Weight:      Height:        Intake/Output Summary (Last 24 hours) at 07/30/2023 0849 Last data filed at 07/30/2023 0102 Gross per 24 hour  Intake 518.07 ml  Output --  Net 518.07 ml   Weight change:  Exam:  General:  Pt is alert, follows commands appropriately, not in acute distress HEENT: No icterus, No thrush, No neck mass, Ava/AT Cardiovascular: RRR, S1/S2, no rubs, no gallops Respiratory: bibasilar rales.   Bibasilar wheeze Abdomen: Soft/+BS, non tender, non distended, no guarding Extremities: No edema, No lymphangitis, No petechiae, No rashes, no synovitis   Data Reviewed: I have personally reviewed following labs and imaging studies Basic Metabolic Panel: Recent Labs  Lab 07/26/23 0525 07/27/23 0429 07/28/23 0504 07/29/23 0501 07/30/23 0449  NA 135 138 138 138 136  K 2.9* 3.7 4.1 4.0 4.0  CL 100 100 98 97* 96*  CO2 27 28 30 31 27   GLUCOSE 271* 172* 143* 145* 177*  BUN 8 9 15 16 19   CREATININE 0.46 0.44 0.52 0.47 0.50  CALCIUM 7.9* 8.3* 8.8* 8.7* 8.9  MG 2.2 2.1 2.2 2.1 2.2   Liver Function Tests: Recent Labs  Lab 07/26/23 0525 07/27/23 0429 07/28/23 0504 07/29/23 0501 07/30/23 0449  AST 9* 9* 9* 10* 10*  ALT 20 17 21 23 24   ALKPHOS 87 81 88 88 88  BILITOT 0.4 0.3 0.2* 0.3 0.4  PROT 6.1* 5.9* 6.5 6.7 6.8  ALBUMIN 2.8* 2.5* 2.8* 2.9* 3.1*   No results for input(s): "LIPASE", "AMYLASE" in the last 168 hours. No results for input(s): "AMMONIA" in the last 168 hours. Coagulation Profile: No results for input(s): "INR", "PROTIME" in the last 168 hours. CBC: Recent Labs  Lab 07/26/23 0525 07/27/23 0429 07/28/23 0504 07/29/23 0501 07/30/23 0449  WBC 14.5* 16.6* 20.6* 16.5* 14.9*  NEUTROABS 13.2* 14.4* 17.3* 12.7* 12.7*  HGB 10.6* 9.5* 10.6* 11.3* 12.2  HCT 33.6* 30.7* 33.1* 35.5* 38.8  MCV 88.0 88.5 87.8 86.8 86.2  PLT 433* 376 460* 421* 432*    Cardiac Enzymes: No results for input(s): "CKTOTAL", "CKMB", "CKMBINDEX", "TROPONINI" in the last 168 hours. BNP: Invalid input(s): "POCBNP" CBG: No results for input(s): "GLUCAP" in the last 168 hours. HbA1C: No results for input(s): "HGBA1C" in the last 72 hours. Urine analysis:    Component Value Date/Time   COLORURINE YELLOW 06/26/2023 0134   APPEARANCEUR CLEAR 06/26/2023 0134   LABSPEC 1.014 06/26/2023 0134   PHURINE 5.0 06/26/2023 0134   GLUCOSEU NEGATIVE 06/26/2023 0134   HGBUR SMALL (A) 06/26/2023 0134   BILIRUBINUR NEGATIVE 06/26/2023 0134   KETONESUR NEGATIVE 06/26/2023 0134   PROTEINUR NEGATIVE 06/26/2023 0134   UROBILINOGEN 0.2 05/02/2009 1015   NITRITE NEGATIVE 06/26/2023 0134   LEUKOCYTESUR NEGATIVE 06/26/2023 0134   Sepsis Labs: @LABRCNTIP (procalcitonin:4,lacticidven:4) ) Recent Results (from the past 240 hour(s))  Resp panel by RT-PCR (RSV, Flu A&B, Covid) Anterior Nasal Swab  Status: None   Collection Time: 07/26/23  2:35 AM   Specimen: Anterior Nasal Swab  Result Value Ref Range Status   SARS Coronavirus 2 by RT PCR NEGATIVE NEGATIVE Final    Comment: (NOTE) SARS-CoV-2 target nucleic acids are NOT DETECTED.  The SARS-CoV-2 RNA is generally detectable in upper respiratory specimens during the acute phase of infection. The lowest concentration of SARS-CoV-2 viral copies this assay can detect is 138 copies/mL. A negative result does not preclude SARS-Cov-2 infection and should not be used as the sole basis for treatment or other patient management decisions. A negative result may occur with  improper specimen collection/handling, submission of specimen other than nasopharyngeal swab, presence of viral mutation(s) within the areas targeted by this assay, and inadequate number of viral copies(<138 copies/mL). A negative result must be combined with clinical observations, patient history, and epidemiological information. The expected result is  Negative.  Fact Sheet for Patients:  BloggerCourse.com  Fact Sheet for Healthcare Providers:  SeriousBroker.it  This test is no t yet approved or cleared by the [place] FDA and  has been authorized for detection and/or diagnosis of SARS-CoV-2 by FDA under an Emergency Use Authorization (EUA). This EUA will remain  in effect (meaning this test can be used) for the duration of the COVID-19 declaration under Section 564(b)(1) of the Act, 21 U.S.C.section 360bbb-3(b)(1), unless the authorization is terminated  or revoked sooner.       Influenza A by PCR NEGATIVE NEGATIVE Final   Influenza B by PCR NEGATIVE NEGATIVE Final    Comment: (NOTE) The Xpert Xpress SARS-CoV-2/FLU/RSV plus assay is intended as an aid in the diagnosis of influenza from Nasopharyngeal swab specimens and should not be used as a sole basis for treatment. Nasal washings and aspirates are unacceptable for Xpert Xpress SARS-CoV-2/FLU/RSV testing.  Fact Sheet for Patients: BloggerCourse.com  Fact Sheet for Healthcare Providers: SeriousBroker.it  This test is not yet approved or cleared by the [place] FDA and has been authorized for detection and/or diagnosis of SARS-CoV-2 by FDA under an Emergency Use Authorization (EUA). This EUA will remain in effect (meaning this test can be used) for the duration of the COVID-19 declaration under Section 564(b)(1) of the Act, 21 U.S.C. section 360bbb-3(b)(1), unless the authorization is terminated or revoked.     Resp Syncytial Virus by PCR NEGATIVE NEGATIVE Final    Comment: (NOTE) Fact Sheet for Patients: BloggerCourse.com  Fact Sheet for Healthcare Providers: SeriousBroker.it  This test is not yet approved or cleared by the [place] FDA and has been authorized for detection and/or diagnosis of  SARS-CoV-2 by FDA under an Emergency Use Authorization (EUA). This EUA will remain in effect (meaning this test can be used) for the duration of the COVID-19 declaration under Section 564(b)(1) of the Act, 21 U.S.C. section 360bbb-3(b)(1), unless the authorization is terminated or revoked.  Performed at [facility], [address], [place], [place] [address]   Culture, blood (routine x 2)     Status: None (Preliminary result)   Collection Time: 07/26/23  2:44 AM   Specimen: Left Antecubital; Blood  Result Value Ref Range Status   Specimen Description LEFT ANTECUBITAL  Final   Special Requests   Final    BOTTLES DRAWN AEROBIC AND ANAEROBIC Blood Culture adequate volume   Culture   Final    NO GROWTH 4 DAYS Performed at [facility], [address] [place], [place] [address]    Report Status PENDING  Incomplete  Culture, blood (Routine  X 2) w Reflex to ID Panel     Status: None (Preliminary result)   Collection Time: 07/26/23  5:25 AM   Specimen: BLOOD LEFT FOREARM  Result Value Ref Range Status   Specimen Description   Final    BLOOD LEFT FOREARM BOTTLES DRAWN AEROBIC AND ANAEROBIC   Special Requests   Final    Blood Culture results may not be optimal due to an excessive volume of blood received in culture bottles   Culture   Final    NO GROWTH 4 DAYS Performed at [facility], [address] [place], [place] [address]    Report Status PENDING  Incomplete  MRSA Next Gen by PCR, Nasal     Status: None   Collection Time: 07/26/23  5:35 AM   Specimen: Nasal Mucosa; Nasal Swab  Result Value Ref Range Status   MRSA by PCR Next Gen NOT DETECTED NOT DETECTED Final    Comment: (NOTE) The GeneXpert MRSA Assay (FDA approved for NASAL specimens only), is one component of a comprehensive MRSA colonization surveillance program. It is not intended to diagnose MRSA infection nor to guide or monitor treatment for MRSA infections. Test performance is not FDA approved in patients less than 83  years old. Performed at [facility], [address], [place], [place] [address]   Expectorated Sputum Assessment w Gram Stain, Rflx to Resp Cult     Status: None   Collection Time: 07/26/23 11:38 AM   Specimen: Sputum  Result Value Ref Range Status   Specimen Description SPUTUM  Final   Special Requests NONE  Final   Sputum evaluation   Final    THIS SPECIMEN IS ACCEPTABLE FOR SPUTUM CULTURE Performed at [facility], [address] [place], [place] [address]    Report Status 07/26/2023 FINAL  Final  Culture, Respiratory w Gram Stain     Status: None   Collection Time: 07/26/23 11:38 AM   Specimen: SPU  Result Value Ref Range Status   Specimen Description   Final    SPUTUM Performed at [facility], [address], [place], [place] [address]    Special Requests   Final    NONE Reflexed from [id number] Performed at [facility], [address], [place], [place] [address]    Gram Stain   Final    ABUNDANT SQUAMOUS EPITHELIAL CELLS PRESENT NO WBC SEEN RARE GRAM POSITIVE COCCI IN PAIRS RARE BUDDING YEAST SEEN RARE GRAM NEGATIVE RODS    Culture   Final    Normal respiratory flora-no Staph aureus or Pseudomonas seen Performed at [facility], [address], [place], [place] [address]    Report Status 07/29/2023 FINAL  Final  Respiratory (~20 pathogens) panel by PCR     Status: None   Collection Time: 07/28/23  1:10 PM   Specimen: Nasopharyngeal Swab; Respiratory  Result Value Ref Range Status   Adenovirus NOT DETECTED NOT DETECTED Final   Coronavirus 229E NOT DETECTED NOT DETECTED Final    Comment: (NOTE) The Coronavirus on the Respiratory Panel, DOES NOT test for the novel  Coronavirus (2019 nCoV)    Coronavirus HKU1 NOT DETECTED NOT DETECTED Final   Coronavirus NL63 NOT DETECTED NOT DETECTED Final   Coronavirus OC43 NOT DETECTED NOT DETECTED Final   Metapneumovirus NOT DETECTED NOT DETECTED Final   Rhinovirus / Enterovirus NOT DETECTED NOT DETECTED Final   Influenza  A NOT DETECTED NOT DETECTED Final   Influenza B NOT DETECTED NOT DETECTED Final  Parainfluenza Virus 1 NOT DETECTED NOT DETECTED Final   Parainfluenza Virus 2 NOT DETECTED NOT DETECTED Final   Parainfluenza Virus 3 NOT DETECTED NOT DETECTED Final   Parainfluenza Virus 4 NOT DETECTED NOT DETECTED Final   Respiratory Syncytial Virus NOT DETECTED NOT DETECTED Final   Bordetella pertussis NOT DETECTED NOT DETECTED Final   Bordetella Parapertussis NOT DETECTED NOT DETECTED Final   Chlamydophila pneumoniae NOT DETECTED NOT DETECTED Final   Mycoplasma pneumoniae NOT DETECTED NOT DETECTED Final    Comment: Performed at [facility], [address], [place], [place] [address]     Scheduled Meds:  [START ON 07/31/2023] albuterol  2.5 mg Nebulization Q6H   arformoterol  15 mcg Nebulization BID   azithromycin  500 mg Oral Daily   budesonide (PULMICORT) nebulizer solution  0.5 mg Nebulization BID   cefadroxil  1,000 mg Oral BID   Chlorhexidine Gluconate Cloth  6 each Topical Daily   citalopram  20 mg Oral Daily   clonazePAM  0.5 mg Oral BID   feeding supplement  237 mL Oral TID BM   furosemide  20 mg Intravenous QPM   guaiFENesin  600 mg Oral BID   heparin  5,000 Units Subcutaneous Q8H   ipratropium-albuterol  3 mL Nebulization Q6H   melatonin  6 mg Oral QHS   methylPREDNISolone (SOLU-MEDROL) injection  60 mg Intravenous Q12H   multivitamin with minerals  1 tablet Oral Daily   nicotine  14 mg Transdermal Daily   [START ON 07/31/2023] revefenacin  175 mcg Nebulization Daily   Continuous Infusions:  ceFEPime (MAXIPIME) IV Stopped (07/30/23 0640)    Procedures/Studies: DG Chest Port 1 View  Result Date: 07/26/2023 CLINICAL DATA:  Cough, hypoxia, COPD EXAM: PORTABLE CHEST 1 VIEW COMPARISON:  Radiographs and CT from 07/17/2023 FINDINGS: Bilateral patchy airspace and interstitial opacities. Chronic interstitial coarsening. No pleural effusion or pneumothorax. Stable cardiomediastinal  silhouette. Left reverse TSA. IMPRESSION: Patchy airspace and interstitial opacities, increased from 07/17/2023 and favoring multifocal pneumonia. Electronically Signed   By: Minerva Fester M.D.   On: 07/26/2023 02:50   CT Angio Chest PE W and/or Wo Contrast  Result Date: 07/17/2023 CLINICAL DATA:  Shortness of breath EXAM: CT ANGIOGRAPHY CHEST WITH CONTRAST TECHNIQUE: Multidetector CT imaging of the chest was performed using the standard protocol during bolus administration of intravenous contrast. Multiplanar CT image reconstructions and MIPs were obtained to evaluate the vascular anatomy. RADIATION DOSE REDUCTION: This exam was performed according to the departmental dose-optimization program which includes automated exposure control, adjustment of the mA and/or kV according to patient size and/or use of iterative reconstruction technique. CONTRAST:  75mL OMNIPAQUE IOHEXOL 350 MG/ML SOLN COMPARISON:  None Available. FINDINGS: Cardiovascular: No evidence of pulmonary embolus. Evaluation of the segmental subsegmental pulmonary arteries is somewhat limited due to respiratory motion artifact. Normal heart size. No pericardial effusion. Normal caliber thoracic aorta with mild atherosclerotic disease. Moderate coronary artery calcifications. Mediastinum/Nodes: Esophagus thyroid are unremarkable. No enlarged lymph nodes seen in the chest. Lungs/Pleura: Central airways are patent. Bilateral patchy ground-glass opacities with more focal areas of consolidation and areas of interstitial thickening. No pleural effusion or pneumothorax. Upper Abdomen: Indeterminate nodule of the right adrenal gland measuring 1.6 cm. No acute abnormality. Musculoskeletal: Left total shoulder arthroplasty. Moderate compression deformity of T12, likely chronic. Sclerosis of the inferior sternum adjacent to the costochondral joints and sclerosis of the bilateral first sternocostal joints, likely degenerative. Review of the MIP images confirms  the above findings. IMPRESSION: 1. No  evidence of pulmonary embolus. Evaluation of the segmental and subsegmental pulmonary arteries is somewhat limited due to respiratory motion artifact. 2. Bilateral patchy ground-glass opacities with more focal areas of consolidation and areas of interstitial thickening, likely resolving sequela of prior multifocal pneumonia. Recommend follow-up chest CT in 3 months to ensure continued resolution. 3. Indeterminate nodule of the right adrenal gland measuring 1.6 cm. Recommend further evaluation with nonemergent adrenal protocol CT. 4. Coronary artery calcifications and aortic Atherosclerosis (ICD10-I70.0). Electronically Signed   By: Allegra Lai M.D.   On: 07/17/2023 16:34   DG Chest Port 1 View  Result Date: 07/17/2023 CLINICAL DATA:  Shortness of breath.  Recent diagnosis of pneumonia. EXAM: PORTABLE CHEST 1 VIEW COMPARISON:  July 08, 2023 FINDINGS: Cardiomediastinal silhouette is normal. Mediastinal contours appear intact. Compared to the prior radiograph, there is partial resolution of the patchy airspace opacities throughout both lungs, right greater than left. Significant residual remains. Osseous structures are without acute abnormality. Soft tissues are grossly normal. IMPRESSION: Partial resolution of the patchy airspace opacities throughout both lungs, right greater than left. Significant residual remains. Electronically Signed   By: Ted Mcalpine M.D.   On: 07/17/2023 13:30   DG Swallowing Func-Speech Pathology  Result Date: 07/12/2023 Table formatting from the original result was not included. Modified Barium Swallow Study Patient Details Name: AVIYAH DESSO MRN: [id number] Date of Birth: 1960-12-12 Today's Date: 07/12/2023 HPI/PMH: History of Present Illness Lilie Dekker Abdelmalek is a 62 yo female who presented to [facility] ED 8/16 after being found at home unresponsive. Per note, pt's son administered "at home narcan", which is vinegar, while pt was unresponsive.  EMS administered Narcan with improvement in mental status. In ED, was hypothermic, tachycardic, SpO2 89% on NRB. UDS + for cocaine, BZD, and THC. CXR with asymmetric bibasilar interstitial and airspace infiltrates c/f multifocal PNA/aspiration. On 8/17, pt with persistent respiratory distress with escalating O2 requirements to HFNC and eventually BiPAP and worsening CXR with rapid progression of bilateral heterogenous consolidative airspace disease in LUL and R lung. Transferred to [facility] 8/17. Required ETT 8/20-25. FEES 8/26 recommended nectar thick liquids. Extubated to HHFNC 55L. PMH includes chronic pain, anxiety/depression, panic disorders, suicide attempt, polysubstance abuse Clinical Impression Pt demonstrates improvement in airway protection. Swallow initiation still slightly late resulting in consistent trace penetration with ejection over multiple swallows. Pt had one instance of aspiration when trying to take a large pill; she gagged while holding glottic closure and had mild aspiration which she sensed and ejected. Advised large pills whole in puree. Overall sensation and respiratory endurance have improved. Pt may resume a regular diet and thin liquids. No SLP f/u needed will sign off. Factors that may increase risk of adverse event in presence of aspiration Rubye Oaks [employer] Clearance Coots 2021): No data recorded Recommendations/Plan: Swallowing Evaluation Recommendations Swallowing Evaluation Recommendations Recommendations: PO diet PO Diet Recommendation: Regular; Thin liquids (Level 0) Liquid Administration via: Cup; Straw Medication Administration: Whole meds with puree Swallowing strategies  : Slow rate Postural changes: Position pt fully upright for meals Oral care recommendations: Oral care BID (2x/day) Treatment Plan Treatment Plan Treatment recommendations: No treatment recommended at this time Follow-up recommendations: No SLP follow up Recommendations Recommendations for follow up therapy are one component of  a multi-disciplinary discharge planning process, led by the attending physician.  Recommendations may be updated based on patient status, additional functional criteria and insurance authorization. Assessment: Orofacial Exam: No data recorded Anatomy: Anatomy: WFL Boluses Administered: Boluses Administered Boluses Administered: Thin liquids (Level 0);  Puree; Solid  Oral Impairment Domain: Oral Impairment Domain Lip Closure: No labial escape Tongue control during bolus hold: Cohesive bolus between tongue to palatal seal Bolus preparation/mastication: Timely and efficient chewing and mashing Bolus transport/lingual motion: Brisk tongue motion Oral residue: Complete oral clearance Initiation of pharyngeal swallow : Pyriform sinuses  Pharyngeal Impairment Domain: Pharyngeal Impairment Domain Soft palate elevation: No bolus between soft palate (SP)/pharyngeal wall (PW) Laryngeal elevation: Complete superior movement of thyroid cartilage with complete approximation of arytenoids to epiglottic petiole Anterior hyoid excursion: Complete anterior movement Epiglottic movement: Complete inversion Laryngeal vestibule closure: Complete, no air/contrast in laryngeal vestibule Pharyngeal stripping wave : Present - complete Pharyngeal contraction (A/P view only): N/A Pharyngoesophageal segment opening: Complete distension and complete duration, no obstruction of flow Tongue base retraction: No contrast between tongue base and posterior pharyngeal wall (PPW) Pharyngeal residue: Complete pharyngeal clearance  Esophageal Impairment Domain: No data recorded Pill: Pill Consistency administered: Thin liquids (Level 0) Thin liquids (Level 0): Impaired (see clinical impressions) (Pt gagged and aspirated with sensation, ejected thin) Penetration/Aspiration Scale Score: Penetration/Aspiration Scale Score 1.  Material does not enter airway: Puree; Solid 3.  Material enters airway, remains ABOVE vocal cords and not ejected out: Thin liquids  (Level 0) 6.  Material enters airway, passes BELOW cords then ejected out: Thin liquids (Level 0) Compensatory Strategies: No data recorded  General Information: Caregiver present: No  Diet Prior to this Study: Dysphagia 3 (mechanical soft); Mildly thick liquids (Level 2, nectar thick)   Temperature : Normal   Respiratory Status: WFL   Supplemental O2: None (Room air)   History of Recent Intubation: Yes  Behavior/Cognition: Alert; Cooperative; Pleasant mood Self-Feeding Abilities: Able to self-feed Baseline vocal quality/speech: Normal Volitional Cough: Able to elicit Volitional Swallow: Able to elicit Exam Limitations: No limitations Goal Planning: No data recorded No data recorded No data recorded No data recorded No data recorded Pain: No data recorded End of Session: Start Time:SLP Start Time (ACUTE ONLY): 0830 Stop Time: SLP Stop Time (ACUTE ONLY): 0840 Time Calculation:SLP Time Calculation (min) (ACUTE ONLY): 10 min Charges: SLP Evaluations $ SLP Speech Visit: 1 Visit SLP Evaluations $MBS Swallow: 1 Procedure SLP visit diagnosis: SLP Visit Diagnosis: Dysphagia, oropharyngeal phase (R13.12) Past Medical History: Past Medical History: Diagnosis Date  Anxiety   Chronic back pain   Depression   History of panic attacks 09/11/2014  Medical history non-contributory   Polysubstance abuse (HCC)   History of taking non-prescribed opiates, BZDs; also +cocaine and THC  Ruptured disc, thoracic   Suicide attempt (HCC) 2002 Past Surgical History: Past Surgical History: Procedure Laterality Date  CESAREAN SECTION    CLAVICLE SURGERY    REVERSE SHOULDER ARTHROPLASTY Left 01/01/2020  Procedure: LEFT REVERSE SHOULDER ARTHROPLASTY;  Surgeon: Cammy Copa, MD;  Location: [facility] OR;  Service: Orthopedics;  Laterality: Left;  TUBAL LIGATION   DeBlois, Riley Nearing 07/12/2023, 8:57 AM  DG CHEST PORT 1 VIEW  Result Date: 07/08/2023 CLINICAL DATA:  Respiratory failure.  Pneumonia. EXAM: PORTABLE CHEST 1 VIEW COMPARISON:  July 04, 2023. FINDINGS: The heart size and mediastinal contours are within normal limits. Status post left shoulder arthroplasty. Endotracheal tube has been removed. Feeding tube is seen entering stomach. Stable bilateral lung opacities are noted, right greater than left, consistent with multifocal pneumonia. IMPRESSION: Stable bilateral lung opacities consistent with multifocal pneumonia. Electronically Signed   By: Lupita Raider M.D.   On: 07/08/2023 14:48   DG Abd 1 View  Result Date: 07/06/2023 CLINICAL DATA:  Vomiting and abdominal pain.  Tube placement. EXAM: ABDOMEN - 1 VIEW COMPARISON:  06/30/2023 FINDINGS: Soft feeding tube has its tip in the midportion of the stomach. There is no sign of small or large bowel obstruction. No evidence of free air. No abnormal calcifications or acute bone findings. IMPRESSION: Soft feeding tube tip in the midportion of the stomach. Electronically Signed   By: Paulina Fusi M.D.   On: 07/06/2023 18:23   DG Chest Port 1 View  Result Date: 07/04/2023 CLINICAL DATA:  Respiratory failure. EXAM: PORTABLE CHEST 1 VIEW COMPARISON:  Chest radiographs 07/03/2023 FINDINGS: An endotracheal tube and right upper extremity PICC are unchanged. A feeding tube courses into the abdomen with tip not imaged. The cardiomediastinal silhouette is unchanged allowing for mild rightward patient rotation on the current examination. Widespread interstitial and patchy, predominantly peripheral airspace opacities in both lungs have not significantly changed. No sizable pleural effusion or pneumothorax is identified. IMPRESSION: Unchanged widespread bilateral lung infiltrates. Electronically Signed   By: Sebastian Ache M.D.   On: 07/04/2023 12:33   DG Chest Port 1 View  Result Date: 07/03/2023 CLINICAL DATA:  Respirator dependent EXAM: PORTABLE CHEST 1 VIEW COMPARISON:  07/01/2023 FINDINGS: Unchanged support apparatus including endotracheal tube, partially imaged enteric feeding tube, and right upper  extremity PICC. Mild cardiomegaly with unchanged diffuse interstitial and heterogeneous airspace opacity, more conspicuous in the right lung. Status post left shoulder reverse arthroplasty. IMPRESSION: 1. Unchanged support apparatus including endotracheal tube, partially imaged enteric feeding tube, and right upper extremity PICC. 2. Mild cardiomegaly with unchanged diffuse interstitial and heterogeneous airspace opacity, more conspicuous in the right lung. No new airspace opacity. Electronically Signed   By: Jearld Lesch M.D.   On: 07/03/2023 12:17   DG Chest Port 1 View  Result Date: 07/01/2023 CLINICAL DATA:  Respiratory failure EXAM: PORTABLE CHEST 1 VIEW COMPARISON:  06/29/2023. FINDINGS: Endotracheal tube tip just below thoracic inlet. Right-sided PICC tip distal SVC. Feeding tube tip below the diaphragm and off x-ray. Unremarkable cardiac silhouette. There is vascular congestion. Areas of alveolar opacity may represent pneumonitis or edema with interval stability. IMPRESSION: Stable patchy interstitial and alveolar opacities that may represent atypical pneumonia or edema. Electronically Signed   By: Layla Maw M.D.   On: 07/01/2023 09:53   DG Abd Portable 1V  Result Date: 06/30/2023 CLINICAL DATA:  Feeding tube placement EXAM: PORTABLE ABDOMEN - 1 VIEW COMPARISON:  09/04/2019 FINDINGS: The feeding tube tip is in the stomach antrum, oriented towards the pyloric channel. Visualized bowel gas pattern is unremarkable. IMPRESSION: 1. Feeding tube tip is in the stomach antrum, oriented towards the pyloric channel. Electronically Signed   By: Gaylyn Rong M.D.   On: 06/30/2023 16:35   [place] EKG SITE RITE  Result Date: 06/30/2023 If Site Rite image not attached, placement could not be confirmed due to current cardiac rhythm.   Catarina Hartshorn, DO  [facility]  If 7PM-7AM, please contact night-coverage [email or web address] Password [email or web address] 07/30/2023, 8:49 AM   LOS: 4 days

## 2023-07-30 NOTE — Plan of Care (Signed)
progressing 

## 2023-07-31 DIAGNOSIS — F1190 Opioid use, unspecified, uncomplicated: Secondary | ICD-10-CM

## 2023-07-31 DIAGNOSIS — J189 Pneumonia, unspecified organism: Secondary | ICD-10-CM | POA: Diagnosis not present

## 2023-07-31 DIAGNOSIS — J9621 Acute and chronic respiratory failure with hypoxia: Secondary | ICD-10-CM | POA: Diagnosis not present

## 2023-07-31 DIAGNOSIS — J441 Chronic obstructive pulmonary disease with (acute) exacerbation: Secondary | ICD-10-CM | POA: Diagnosis not present

## 2023-07-31 LAB — COMPREHENSIVE METABOLIC PANEL
ALT: 24 U/L (ref 0–44)
AST: 11 U/L — ABNORMAL LOW (ref 15–41)
Albumin: 3.2 g/dL — ABNORMAL LOW (ref 3.5–5.0)
Alkaline Phosphatase: 84 U/L (ref 38–126)
Anion gap: 11 (ref 5–15)
BUN: 24 mg/dL — ABNORMAL HIGH (ref 8–23)
CO2: 27 mmol/L (ref 22–32)
Calcium: 8.9 mg/dL (ref 8.9–10.3)
Chloride: 98 mmol/L (ref 98–111)
Creatinine, Ser: 0.5 mg/dL (ref 0.44–1.00)
GFR, Estimated: >60 mL/min (ref >60)
Glucose, Bld: 152 mg/dL — ABNORMAL HIGH (ref 70–99)
Potassium: 4.5 mmol/L (ref 3.5–5.1)
Sodium: 136 mmol/L (ref 135–145)
Total Bilirubin: 0.4 mg/dL (ref 0.3–1.2)
Total Protein: 6.8 g/dL (ref 6.5–8.1)

## 2023-07-31 LAB — CBC WITH DIFFERENTIAL/PLATELET
Abs Immature Granulocytes: 0.12 10*3/uL — ABNORMAL HIGH (ref 0.00–0.07)
Basophils Absolute: 0 10*3/uL (ref 0.0–0.1)
Basophils Relative: 0 %
Eosinophils Absolute: 0 10*3/uL (ref 0.0–0.5)
Eosinophils Relative: 0 %
HCT: 39.8 % (ref 36.0–46.0)
Hemoglobin: 12.5 g/dL (ref 12.0–15.0)
Immature Granulocytes: 1 %
Lymphocytes Relative: 11 %
Lymphs Abs: 1.8 10*3/uL (ref 0.7–4.0)
MCH: 27 pg (ref 26.0–34.0)
MCHC: 31.4 g/dL (ref 30.0–36.0)
MCV: 86 fL (ref 80.0–100.0)
Monocytes Absolute: 0.5 10*3/uL (ref 0.1–1.0)
Monocytes Relative: 3 %
Neutro Abs: 14.3 10*3/uL — ABNORMAL HIGH (ref 1.7–7.7)
Neutrophils Relative %: 85 %
Platelets: 420 10*3/uL — ABNORMAL HIGH (ref 150–400)
RBC: 4.63 MIL/uL (ref 3.87–5.11)
RDW: 16 % — ABNORMAL HIGH (ref 11.5–15.5)
WBC: 16.8 10*3/uL — ABNORMAL HIGH (ref 4.0–10.5)
nRBC: 0 % (ref 0.0–0.2)

## 2023-07-31 LAB — CULTURE, BLOOD (ROUTINE X 2)
Culture: NO GROWTH
Culture: NO GROWTH
Special Requests: ADEQUATE

## 2023-07-31 LAB — MAGNESIUM: Magnesium: 2.4 mg/dL (ref 1.7–2.4)

## 2023-07-31 LAB — PHOSPHORUS: Phosphorus: 3.3 mg/dL (ref 2.5–4.6)

## 2023-07-31 MED ORDER — PREDNISONE 50 MG PO TABS: 50.0000 mg | ORAL_TABLET | Freq: Every day | ORAL | 0 refills | Status: DC

## 2023-07-31 MED ORDER — CEFADROXIL 500 MG PO CAPS: 1000.0000 mg | ORAL_CAPSULE | Freq: Two times a day (BID) | ORAL | 0 refills | Status: DC

## 2023-07-31 MED ORDER — PNEUMOCOCCAL 20-VAL CONJ VACC 0.5 ML IM SUSY: 0.5000 mL | PREFILLED_SYRINGE | INTRAMUSCULAR | Status: DC

## 2023-07-31 MED ORDER — INFLUENZA VIRUS VACC SPLIT PF (FLUZONE) 0.5 ML IM SUSY: 0.5000 mL | PREFILLED_SYRINGE | INTRAMUSCULAR | Status: DC

## 2023-07-31 MED ORDER — AZITHROMYCIN 500 MG PO TABS: 500.0000 mg | ORAL_TABLET | Freq: Every day | ORAL | 0 refills | Status: DC

## 2023-07-31 MED ORDER — ENSURE ENLIVE PO LIQD: 237.0000 mL | Freq: Three times a day (TID) | ORAL | Status: DC

## 2023-07-31 MED ORDER — PREDNISONE 20 MG PO TABS: 50.0000 mg | ORAL_TABLET | Freq: Every day | ORAL | Status: DC

## 2023-07-31 MED ORDER — CLONAZEPAM 0.5 MG PO TABS: 0.5000 mg | ORAL_TABLET | Freq: Two times a day (BID) | ORAL | 0 refills | Status: DC

## 2023-07-31 NOTE — TOC Transition Note (Signed)
Transition of Care [facility]) - CM/SW Discharge Note   Patient Details  Name: Casey [name] MRN: [id number] Date of Birth: Nov 24, 1960  [facility] CM/SW Contact:  Princella Ion, LCSW Phone Number: 07/31/2023, 12:38 PM   Clinical Narrative:    [facility] consulted for Medication Assistance. Patient reports she has the money for her copay and states she just got paid. D/c summary in and no TOC needs identified.     Barriers to Discharge: Continued Medical Work up   Patient Goals and CMS Choice CMS Medicare.gov Compare Post Acute Care list provided to:: Patient Choice offered to / list presented to : Patient  Discharge Placement                         Discharge Plan and Services Additional resources added to the After Visit Summary for   In-house Referral: Clinical Social Work Discharge Planning Services: CM Consult Post Acute Care Choice: Durable Medical Equipment                               Social Determinants of Health (SDOH) Interventions SDOH Screenings   Food Insecurity: Food Insecurity Present (07/31/2023)  Housing: Low Risk  (07/31/2023)  Transportation Needs: Unmet Transportation Needs (07/31/2023)  Utilities: At Risk (07/31/2023)  Depression (PHQ2-9): Medium Risk (02/24/2023)  Tobacco Use: High Risk (07/26/2023)     Readmission Risk Interventions    07/27/2023   10:34 AM  Readmission Risk Prevention Plan  Transportation Screening Complete  HRI or Home Care Consult Complete  Social Work Consult for Recovery Care Planning/Counseling Complete  Palliative Care Screening Not Applicable  Medication Review Oceanographer) Complete

## 2023-07-31 NOTE — Progress Notes (Signed)
Nsg Discharge Note  Admit Date:  07/26/2023 Discharge date: 07/31/2023   Casey [name] to be D/C'd Home per MD order.  AVS completed.   Patient/caregiver able to verbalize understanding.  Discharge Medication: Allergies as of 07/31/2023   No Known Allergies      Medication List     TAKE these medications    albuterol 108 (90 Base) MCG/ACT inhaler Commonly known as: VENTOLIN HFA Inhale 2 puffs into the lungs every 6 (six) hours as needed for wheezing or shortness of breath.   azithromycin 500 MG tablet Commonly known as: ZITHROMAX Take 1 tablet (500 mg total) by mouth daily. Start taking on: August 01, 2023   cefadroxil 500 MG capsule Commonly known as: DURICEF Take 2 capsules (1,000 mg total) by mouth 2 (two) times daily.   cholecalciferol 25 MCG (1000 UNIT) tablet Commonly known as: VITAMIN D3 Take 1,000 Units by mouth daily.   citalopram 20 MG tablet Commonly known as: CELEXA TAKE 1 TABLET BY MOUTH EVERY DAY   clonazePAM 0.5 MG tablet Commonly known as: KLONOPIN Take 1 tablet (0.5 mg total) by mouth 2 (two) times daily.   feeding supplement Liqd Take 237 mLs by mouth 3 (three) times daily between meals.   fluticasone furoate-vilanterol 100-25 MCG/ACT Aepb Commonly known as: BREO ELLIPTA Inhale 1 puff into the lungs daily.   guaifenesin 100 MG/5ML syrup Commonly known as: ROBITUSSIN Take 200 mg by mouth 3 (three) times daily as needed for cough.   hydrOXYzine 50 MG tablet Commonly known as: ATARAX Take 1 tablet (50 mg total) by mouth 3 (three) times daily as needed.   ibuprofen 200 MG tablet Commonly known as: ADVIL Take 200 mg by mouth every 6 (six) hours as needed for fever, headache or mild pain.   predniSONE 50 MG tablet Commonly known as: DELTASONE Take 1 tablet (50 mg total) by mouth daily with breakfast. Start taking on: August 01, 2023        Discharge Assessment: Vitals:   07/31/23 0053 07/31/23 0447  BP: 122/81 123/73  Pulse:  79 81  Resp: 20 20  Temp:  97.9 F (36.6 C)  SpO2: 100% 99%   Skin clean, dry and intact without evidence of skin break down, no evidence of skin tears noted. IV catheter discontinued intact. Site without signs and symptoms of complications - no redness or edema noted at insertion site, patient denies c/o pain - only slight tenderness at site.  Dressing with slight pressure applied.  D/c Instructions-Education: Discharge instructions given to patient/family with verbalized understanding. D/c education completed with patient/family including follow up instructions, medication list, d/c activities limitations if indicated, with other d/c instructions as indicated by MD - patient able to verbalize understanding, all questions fully answered. Patient instructed to return to ED, call 911, or call MD for any changes in condition.  Patient escorted via WC, and D/C home via private auto.  Laurena Spies, RN 07/31/2023 12:27 PM

## 2023-07-31 NOTE — Progress Notes (Signed)
Pt progressing

## 2023-07-31 NOTE — Discharge Summary (Addendum)
Physician Discharge Summary   Patient: Casey [name] MRN: [id number] DOB: Feb 21, 1961  Admit date:     07/26/2023  Discharge date: 07/31/23  Discharge Physician: Onalee Hua Orien Mayhall   PCP: Patient, No Pcp Per   Recommendations at discharge:   Please follow up with primary care provider within 1-2 weeks  Please repeat BMP and CBC in one week  Hospital Course: 62 year old female with past medical history significant for chronic pain,  chronic respiratory failure, COPD, chronic tobacco use, history of taking opioids and benzodiazepines that have not been prescribed.  History of cocaine, THC use.  Pt presents today after several recent hospitalizations.  She was admitted 8/17 thru 9/2 with encephalopathy from polysubstance abuse and aspiration pneumonia and subsequently decompensated requiring intubation and transferred to [facility].  She was readmitted on 9/7 with acute respiratory failure with hypoxia, COPD exacerbation and subsequently discharged 9/9 on 2L/min supplemental oxygen.   She returns to ED 9/16 from home complaining of increasing shortness of breath not improving with home inhalers. She apparently does not have a nebulizer at home.  She had increased her oxygen to 4L/min and remained very short of breath with cough, chest congestion, fever and malaise.  She reports that she has recently stopped using recreational drugs.  She is still smoking but has apparently cut back on cigarettes.   She required NRB oxygenation but then weaned to 6L/min [place].  Her CXR was positive for multifocal areas of pneumonia.  Covid/Flu/RSV testing was negative.  She is being admitted to stepdown ICU due to known high risk for acute decompensation for acute on chronic respiratory failure secondary to multifocal pneumonia and COPD exacerbation.   Assessment and Plan: Sepsis secondary to multifocal pneumonia  - given IVF initially>>saline locked - following blood culture--neg to date - obtain sputum culture--flora -  follow up legionella and strep pneumo testing --neg -sepsis physiology resolved   Acute on chronic respiratory failure with hypoxia  - secondary to acute COPD exacerbation and superimposed multifocal pneumonia - chronically on 2L - Broad spectrum antibiotics for presumed HCAP given multiple recent hospitalizations - pt has history of rapid decompensation and intubation--continues to need short duration BiPAP - BIPAP PRN order in place and patient is currently on bipap - added brovana, pulmicort -added yupelri -ultimately weaned back to 2L at time of dc--she remained off BiPAP x 48 hours and remained stable   Acute COPD exacerbation  Multifocal Pneumonia  - MRSA screen negative so we can discontinue IV vancomycin therapy  - continue IV steroids: increased solumedrol 60 mg IV BID  - continue scheduled bronchodilators  - continue mucinex 600 mg BID  - continue IV cefepime>>po cefadroxil>>d/c home with 2 days more - PCT <0.10 - follow blood cultures--neg to date - added pulmicort and brovana -added yupelri -add klonopin for anxiety>>d/c home with 0.5 mg, #10, no RF -d/c home with prednisone x 4 more days   Leukocytosis  - secondary to pneumonia and steroids - follow CBC diff   Normocytic anemia  - Hg stable from recent testing - follow CBC daily  - transfuse for Hg<7   Tobacco Abuse - pt still smoking - nicotine patch 14 mg daily  - cessation discussed   Polysubstance abuse  - follow up urine toxicology screen --positive for benzo and opiates - no drug withdrawal symptoms  - pt buys percocet off the street   Hypokalemia - check serum magnesium--2.1 - oral and IV potassium replacement ordered   Hyperglycemia / Prediabetes mellitus -  suspect this is steroid induced - add on A1c --> 6.0%    Severe protein calorie malnutrition - add supplements   Elevated BPs and sinus tachycardia - possible physiologic response to IV steroids and albuterol treatments given in ED   -overall improved -adding low dose clonazepam also helped  Moderate malnutrition -continue supplements      Consultants: none Procedures performed: none  Disposition: Home Diet recommendation:  Cardiac diet DISCHARGE MEDICATION: Allergies as of 07/31/2023   No Known Allergies      Medication List     TAKE these medications    albuterol 108 (90 Base) MCG/ACT inhaler Commonly known as: VENTOLIN HFA Inhale 2 puffs into the lungs every 6 (six) hours as needed for wheezing or shortness of breath.   azithromycin 500 MG tablet Commonly known as: ZITHROMAX Take 1 tablet (500 mg total) by mouth daily. Start taking on: August 01, 2023   cefadroxil 500 MG capsule Commonly known as: DURICEF Take 2 capsules (1,000 mg total) by mouth 2 (two) times daily.   cholecalciferol 25 MCG (1000 UNIT) tablet Commonly known as: VITAMIN D3 Take 1,000 Units by mouth daily.   citalopram 20 MG tablet Commonly known as: CELEXA TAKE 1 TABLET BY MOUTH EVERY DAY   clonazePAM 0.5 MG tablet Commonly known as: KLONOPIN Take 1 tablet (0.5 mg total) by mouth 2 (two) times daily.   feeding supplement Liqd Take 237 mLs by mouth 3 (three) times daily between meals.   fluticasone furoate-vilanterol 100-25 MCG/ACT Aepb Commonly known as: BREO ELLIPTA Inhale 1 puff into the lungs daily.   guaifenesin 100 MG/5ML syrup Commonly known as: ROBITUSSIN Take 200 mg by mouth 3 (three) times daily as needed for cough.   hydrOXYzine 50 MG tablet Commonly known as: ATARAX Take 1 tablet (50 mg total) by mouth 3 (three) times daily as needed.   ibuprofen 200 MG tablet Commonly known as: ADVIL Take 200 mg by mouth every 6 (six) hours as needed for fever, headache or mild pain.   predniSONE 50 MG tablet Commonly known as: DELTASONE Take 1 tablet (50 mg total) by mouth daily with breakfast. Start taking on: August 01, 2023        Follow-up Information     [facility] Follow up in 1  week(s).   Contact information: [address] [place] [address] [phone]               Discharge Exam: Ceasar Mons Weights   07/26/23 0233 07/26/23 2127 07/30/23 0404  Weight: 58.1 kg 61.7 kg 55 kg   HEENT:  [place]/AT, No thrush, no icterus CV:  RRR, no rub, no S3, no S4 Lung:  bibasilar rales.  No wheeze Abd:  soft/+BS, NT Ext:  No edema, no lymphangitis, no synovitis, no rash   Condition at discharge: stable  The results of significant diagnostics from this hospitalization (including imaging, microbiology, ancillary and laboratory) are listed below for reference.   Imaging Studies: DG Chest Port 1 View  Result Date: 07/26/2023 CLINICAL DATA:  Cough, hypoxia, COPD EXAM: PORTABLE CHEST 1 VIEW COMPARISON:  Radiographs and CT from 07/17/2023 FINDINGS: Bilateral patchy airspace and interstitial opacities. Chronic interstitial coarsening. No pleural effusion or pneumothorax. Stable cardiomediastinal silhouette. Left reverse TSA. IMPRESSION: Patchy airspace and interstitial opacities, increased from 07/17/2023 and favoring multifocal pneumonia. Electronically Signed   By: Minerva Fester M.D.   On: 07/26/2023 02:50   CT Angio Chest PE W and/or Wo Contrast  Result Date:  07/17/2023 CLINICAL DATA:  Shortness of breath EXAM: CT ANGIOGRAPHY CHEST WITH CONTRAST TECHNIQUE: Multidetector CT imaging of the chest was performed using the standard protocol during bolus administration of intravenous contrast. Multiplanar CT image reconstructions and MIPs were obtained to evaluate the vascular anatomy. RADIATION DOSE REDUCTION: This exam was performed according to the departmental dose-optimization program which includes automated exposure control, adjustment of the mA and/or kV according to patient size and/or use of iterative reconstruction technique. CONTRAST:  75mL OMNIPAQUE IOHEXOL 350 MG/ML SOLN COMPARISON:  None Available. FINDINGS: Cardiovascular: No evidence  of pulmonary embolus. Evaluation of the segmental subsegmental pulmonary arteries is somewhat limited due to respiratory motion artifact. Normal heart size. No pericardial effusion. Normal caliber thoracic aorta with mild atherosclerotic disease. Moderate coronary artery calcifications. Mediastinum/Nodes: Esophagus thyroid are unremarkable. No enlarged lymph nodes seen in the chest. Lungs/Pleura: Central airways are patent. Bilateral patchy ground-glass opacities with more focal areas of consolidation and areas of interstitial thickening. No pleural effusion or pneumothorax. Upper Abdomen: Indeterminate nodule of the right adrenal gland measuring 1.6 cm. No acute abnormality. Musculoskeletal: Left total shoulder arthroplasty. Moderate compression deformity of T12, likely chronic. Sclerosis of the inferior sternum adjacent to the costochondral joints and sclerosis of the bilateral first sternocostal joints, likely degenerative. Review of the MIP images confirms the above findings. IMPRESSION: 1. No evidence of pulmonary embolus. Evaluation of the segmental and subsegmental pulmonary arteries is somewhat limited due to respiratory motion artifact. 2. Bilateral patchy ground-glass opacities with more focal areas of consolidation and areas of interstitial thickening, likely resolving sequela of prior multifocal pneumonia. Recommend follow-up chest CT in 3 months to ensure continued resolution. 3. Indeterminate nodule of the right adrenal gland measuring 1.6 cm. Recommend further evaluation with nonemergent adrenal protocol CT. 4. Coronary artery calcifications and aortic Atherosclerosis (ICD10-I70.0). Electronically Signed   By: Allegra Lai M.D.   On: 07/17/2023 16:34   DG Chest Port 1 View  Result Date: 07/17/2023 CLINICAL DATA:  Shortness of breath.  Recent diagnosis of pneumonia. EXAM: PORTABLE CHEST 1 VIEW COMPARISON:  July 08, 2023 FINDINGS: Cardiomediastinal silhouette is normal. Mediastinal contours  appear intact. Compared to the prior radiograph, there is partial resolution of the patchy airspace opacities throughout both lungs, right greater than left. Significant residual remains. Osseous structures are without acute abnormality. Soft tissues are grossly normal. IMPRESSION: Partial resolution of the patchy airspace opacities throughout both lungs, right greater than left. Significant residual remains. Electronically Signed   By: Ted Mcalpine M.D.   On: 07/17/2023 13:30   DG Swallowing Func-Speech Pathology  Result Date: 07/12/2023 Table formatting from the original result was not included. Modified Barium Swallow Study Patient Details Name: ALLEY DUNKERSON MRN: [id number] Date of Birth: 05-23-1961 Today's Date: 07/12/2023 HPI/PMH: History of Present Illness Mlynn Weinkauf Noury is a 62 yo female who presented to [facility] ED 8/16 after being found at home unresponsive. Per note, pt's son administered "at home narcan", which is vinegar, while pt was unresponsive. EMS administered Narcan with improvement in mental status. In ED, was hypothermic, tachycardic, SpO2 89% on NRB. UDS + for cocaine, BZD, and THC. CXR with asymmetric bibasilar interstitial and airspace infiltrates c/f multifocal PNA/aspiration. On 8/17, pt with persistent respiratory distress with escalating O2 requirements to HFNC and eventually BiPAP and worsening CXR with rapid progression of bilateral heterogenous consolidative airspace disease in LUL and R lung. Transferred to [facility] 8/17. Required ETT 8/20-25. FEES 8/26 recommended nectar thick liquids. Extubated to HHFNC 55L. PMH includes chronic pain,  anxiety/depression, panic disorders, suicide attempt, polysubstance abuse Clinical Impression Pt demonstrates improvement in airway protection. Swallow initiation still slightly late resulting in consistent trace penetration with ejection over multiple swallows. Pt had one instance of aspiration when trying to take a large pill; she gagged while holding  glottic closure and had mild aspiration which she sensed and ejected. Advised large pills whole in puree. Overall sensation and respiratory endurance have improved. Pt may resume a regular diet and thin liquids. No SLP f/u needed will sign off. Factors that may increase risk of adverse event in presence of aspiration Rubye Oaks [employer] Clearance Coots 2021): No data recorded Recommendations/Plan: Swallowing Evaluation Recommendations Swallowing Evaluation Recommendations Recommendations: PO diet PO Diet Recommendation: Regular; Thin liquids (Level 0) Liquid Administration via: Cup; Straw Medication Administration: Whole meds with puree Swallowing strategies  : Slow rate Postural changes: Position pt fully upright for meals Oral care recommendations: Oral care BID (2x/day) Treatment Plan Treatment Plan Treatment recommendations: No treatment recommended at this time Follow-up recommendations: No SLP follow up Recommendations Recommendations for follow up therapy are one component of a multi-disciplinary discharge planning process, led by the attending physician.  Recommendations may be updated based on patient status, additional functional criteria and insurance authorization. Assessment: Orofacial Exam: No data recorded Anatomy: Anatomy: WFL Boluses Administered: Boluses Administered Boluses Administered: Thin liquids (Level 0); Puree; Solid  Oral Impairment Domain: Oral Impairment Domain Lip Closure: No labial escape Tongue control during bolus hold: Cohesive bolus between tongue to palatal seal Bolus preparation/mastication: Timely and efficient chewing and mashing Bolus transport/lingual motion: Brisk tongue motion Oral residue: Complete oral clearance Initiation of pharyngeal swallow : Pyriform sinuses  Pharyngeal Impairment Domain: Pharyngeal Impairment Domain Soft palate elevation: No bolus between soft palate (SP)/pharyngeal wall (PW) Laryngeal elevation: Complete superior movement of thyroid cartilage with complete  approximation of arytenoids to epiglottic petiole Anterior hyoid excursion: Complete anterior movement Epiglottic movement: Complete inversion Laryngeal vestibule closure: Complete, no air/contrast in laryngeal vestibule Pharyngeal stripping wave : Present - complete Pharyngeal contraction (A/P view only): N/A Pharyngoesophageal segment opening: Complete distension and complete duration, no obstruction of flow Tongue base retraction: No contrast between tongue base and posterior pharyngeal wall (PPW) Pharyngeal residue: Complete pharyngeal clearance  Esophageal Impairment Domain: No data recorded Pill: Pill Consistency administered: Thin liquids (Level 0) Thin liquids (Level 0): Impaired (see clinical impressions) (Pt gagged and aspirated with sensation, ejected thin) Penetration/Aspiration Scale Score: Penetration/Aspiration Scale Score 1.  Material does not enter airway: Puree; Solid 3.  Material enters airway, remains ABOVE vocal cords and not ejected out: Thin liquids (Level 0) 6.  Material enters airway, passes BELOW cords then ejected out: Thin liquids (Level 0) Compensatory Strategies: No data recorded  General Information: Caregiver present: No  Diet Prior to this Study: Dysphagia 3 (mechanical soft); Mildly thick liquids (Level 2, nectar thick)   Temperature : Normal   Respiratory Status: WFL   Supplemental O2: None (Room air)   History of Recent Intubation: Yes  Behavior/Cognition: Alert; Cooperative; Pleasant mood Self-Feeding Abilities: Able to self-feed Baseline vocal quality/speech: Normal Volitional Cough: Able to elicit Volitional Swallow: Able to elicit Exam Limitations: No limitations Goal Planning: No data recorded No data recorded No data recorded No data recorded No data recorded Pain: No data recorded End of Session: Start Time:SLP Start Time (ACUTE ONLY): 0830 Stop Time: SLP Stop Time (ACUTE ONLY): 0840 Time Calculation:SLP Time Calculation (min) (ACUTE ONLY): 10 min Charges: SLP Evaluations $  SLP Speech Visit: 1 Visit SLP Evaluations $MBS Swallow: 1  Procedure SLP visit diagnosis: SLP Visit Diagnosis: Dysphagia, oropharyngeal phase (R13.12) Past Medical History: Past Medical History: Diagnosis Date  Anxiety   Chronic back pain   Depression   History of panic attacks 09/11/2014  Medical history non-contributory   Polysubstance abuse (HCC)   History of taking non-prescribed opiates, BZDs; also +cocaine and THC  Ruptured disc, thoracic   Suicide attempt (HCC) 2002 Past Surgical History: Past Surgical History: Procedure Laterality Date  CESAREAN SECTION    CLAVICLE SURGERY    REVERSE SHOULDER ARTHROPLASTY Left 01/01/2020  Procedure: LEFT REVERSE SHOULDER ARTHROPLASTY;  Surgeon: Cammy Copa, MD;  Location: [facility] OR;  Service: Orthopedics;  Laterality: Left;  TUBAL LIGATION   DeBlois, Riley Nearing 07/12/2023, 8:57 AM  DG CHEST PORT 1 VIEW  Result Date: 07/08/2023 CLINICAL DATA:  Respiratory failure.  Pneumonia. EXAM: PORTABLE CHEST 1 VIEW COMPARISON:  July 04, 2023. FINDINGS: The heart size and mediastinal contours are within normal limits. Status post left shoulder arthroplasty. Endotracheal tube has been removed. Feeding tube is seen entering stomach. Stable bilateral lung opacities are noted, right greater than left, consistent with multifocal pneumonia. IMPRESSION: Stable bilateral lung opacities consistent with multifocal pneumonia. Electronically Signed   By: Lupita Raider M.D.   On: 07/08/2023 14:48   DG Abd 1 View  Result Date: 07/06/2023 CLINICAL DATA:  Vomiting and abdominal pain.  Tube placement. EXAM: ABDOMEN - 1 VIEW COMPARISON:  06/30/2023 FINDINGS: Soft feeding tube has its tip in the midportion of the stomach. There is no sign of small or large bowel obstruction. No evidence of free air. No abnormal calcifications or acute bone findings. IMPRESSION: Soft feeding tube tip in the midportion of the stomach. Electronically Signed   By: Paulina Fusi M.D.   On: 07/06/2023 18:23   DG  Chest Port 1 View  Result Date: 07/04/2023 CLINICAL DATA:  Respiratory failure. EXAM: PORTABLE CHEST 1 VIEW COMPARISON:  Chest radiographs 07/03/2023 FINDINGS: An endotracheal tube and right upper extremity PICC are unchanged. A feeding tube courses into the abdomen with tip not imaged. The cardiomediastinal silhouette is unchanged allowing for mild rightward patient rotation on the current examination. Widespread interstitial and patchy, predominantly peripheral airspace opacities in both lungs have not significantly changed. No sizable pleural effusion or pneumothorax is identified. IMPRESSION: Unchanged widespread bilateral lung infiltrates. Electronically Signed   By: Sebastian Ache M.D.   On: 07/04/2023 12:33   DG Chest Port 1 View  Result Date: 07/03/2023 CLINICAL DATA:  Respirator dependent EXAM: PORTABLE CHEST 1 VIEW COMPARISON:  07/01/2023 FINDINGS: Unchanged support apparatus including endotracheal tube, partially imaged enteric feeding tube, and right upper extremity PICC. Mild cardiomegaly with unchanged diffuse interstitial and heterogeneous airspace opacity, more conspicuous in the right lung. Status post left shoulder reverse arthroplasty. IMPRESSION: 1. Unchanged support apparatus including endotracheal tube, partially imaged enteric feeding tube, and right upper extremity PICC. 2. Mild cardiomegaly with unchanged diffuse interstitial and heterogeneous airspace opacity, more conspicuous in the right lung. No new airspace opacity. Electronically Signed   By: Jearld Lesch M.D.   On: 07/03/2023 12:17    Microbiology: Results for orders placed or performed during the hospital encounter of 07/26/23  Resp panel by RT-PCR (RSV, Flu A&B, Covid) Anterior Nasal Swab     Status: None   Collection Time: 07/26/23  2:35 AM   Specimen: Anterior Nasal Swab  Result Value Ref Range Status   SARS Coronavirus 2 by RT PCR NEGATIVE NEGATIVE Final    Comment: (NOTE) SARS-CoV-2 target  nucleic acids are NOT  DETECTED.  The SARS-CoV-2 RNA is generally detectable in upper respiratory specimens during the acute phase of infection. The lowest concentration of SARS-CoV-2 viral copies this assay can detect is 138 copies/mL. A negative result does not preclude SARS-Cov-2 infection and should not be used as the sole basis for treatment or other patient management decisions. A negative result may occur with  improper specimen collection/handling, submission of specimen other than nasopharyngeal swab, presence of viral mutation(s) within the areas targeted by this assay, and inadequate number of viral copies(<138 copies/mL). A negative result must be combined with clinical observations, patient history, and epidemiological information. The expected result is Negative.  Fact Sheet for Patients:  BloggerCourse.com  Fact Sheet for Healthcare Providers:  SeriousBroker.it  This test is no t yet approved or cleared by the [place] FDA and  has been authorized for detection and/or diagnosis of SARS-CoV-2 by FDA under an Emergency Use Authorization (EUA). This EUA will remain  in effect (meaning this test can be used) for the duration of the COVID-19 declaration under Section 564(b)(1) of the Act, 21 U.S.C.section 360bbb-3(b)(1), unless the authorization is terminated  or revoked sooner.       Influenza A by PCR NEGATIVE NEGATIVE Final   Influenza B by PCR NEGATIVE NEGATIVE Final    Comment: (NOTE) The Xpert Xpress SARS-CoV-2/FLU/RSV plus assay is intended as an aid in the diagnosis of influenza from Nasopharyngeal swab specimens and should not be used as a sole basis for treatment. Nasal washings and aspirates are unacceptable for Xpert Xpress SARS-CoV-2/FLU/RSV testing.  Fact Sheet for Patients: BloggerCourse.com  Fact Sheet for Healthcare Providers: SeriousBroker.it  This test is not yet  approved or cleared by the [place] FDA and has been authorized for detection and/or diagnosis of SARS-CoV-2 by FDA under an Emergency Use Authorization (EUA). This EUA will remain in effect (meaning this test can be used) for the duration of the COVID-19 declaration under Section 564(b)(1) of the Act, 21 U.S.C. section 360bbb-3(b)(1), unless the authorization is terminated or revoked.     Resp Syncytial Virus by PCR NEGATIVE NEGATIVE Final    Comment: (NOTE) Fact Sheet for Patients: BloggerCourse.com  Fact Sheet for Healthcare Providers: SeriousBroker.it  This test is not yet approved or cleared by the [place] FDA and has been authorized for detection and/or diagnosis of SARS-CoV-2 by FDA under an Emergency Use Authorization (EUA). This EUA will remain in effect (meaning this test can be used) for the duration of the COVID-19 declaration under Section 564(b)(1) of the Act, 21 U.S.C. section 360bbb-3(b)(1), unless the authorization is terminated or revoked.  Performed at [facility], [address] [place], [place] [address]   Culture, blood (routine x 2)     Status: None   Collection Time: 07/26/23  2:44 AM   Specimen: Left Antecubital; Blood  Result Value Ref Range Status   Specimen Description LEFT ANTECUBITAL  Final   Special Requests   Final    BOTTLES DRAWN AEROBIC AND ANAEROBIC Blood Culture adequate volume   Culture   Final    NO GROWTH 5 DAYS Performed at [facility], [address] [place], [place] [address]    Report Status 07/31/2023 FINAL  Final  Culture, blood (Routine X 2) w Reflex to ID Panel     Status: None   Collection Time: 07/26/23  5:25 AM   Specimen: BLOOD LEFT FOREARM  Result Value Ref Range Status   Specimen Description   Final  BLOOD LEFT FOREARM BOTTLES DRAWN AEROBIC AND ANAEROBIC   Special Requests   Final    Blood Culture results may not be optimal due to an excessive volume of  blood received in culture bottles   Culture   Final    NO GROWTH 5 DAYS Performed at [facility], [address], [place], [place] [address]    Report Status 07/31/2023 FINAL  Final  MRSA Next Gen by PCR, Nasal     Status: None   Collection Time: 07/26/23  5:35 AM   Specimen: Nasal Mucosa; Nasal Swab  Result Value Ref Range Status   MRSA by PCR Next Gen NOT DETECTED NOT DETECTED Final    Comment: (NOTE) The GeneXpert MRSA Assay (FDA approved for NASAL specimens only), is one component of a comprehensive MRSA colonization surveillance program. It is not intended to diagnose MRSA infection nor to guide or monitor treatment for MRSA infections. Test performance is not FDA approved in patients less than 75 years old. Performed at [facility], [address] [place], [place] [address]   Expectorated Sputum Assessment w Gram Stain, Rflx to Resp Cult     Status: None   Collection Time: 07/26/23 11:38 AM   Specimen: Sputum  Result Value Ref Range Status   Specimen Description SPUTUM  Final   Special Requests NONE  Final   Sputum evaluation   Final    THIS SPECIMEN IS ACCEPTABLE FOR SPUTUM CULTURE Performed at [facility], [address] [place], [place] [address]    Report Status 07/26/2023 FINAL  Final  Culture, Respiratory w Gram Stain     Status: None   Collection Time: 07/26/23 11:38 AM   Specimen: SPU  Result Value Ref Range Status   Specimen Description   Final    SPUTUM Performed at [facility], [address] [place], [place] [address]    Special Requests   Final    NONE Reflexed from [id number] Performed at [facility], [address], [place], [place] [address]    Gram Stain   Final    ABUNDANT SQUAMOUS EPITHELIAL CELLS PRESENT NO WBC SEEN RARE GRAM POSITIVE COCCI IN PAIRS RARE BUDDING YEAST SEEN RARE GRAM NEGATIVE RODS    Culture   Final    Normal respiratory flora-no Staph aureus or Pseudomonas seen Performed at [facility], [address],  [place], [place] [address]    Report Status 07/29/2023 FINAL  Final  Respiratory (~20 pathogens) panel by PCR     Status: None   Collection Time: 07/28/23  1:10 PM   Specimen: Nasopharyngeal Swab; Respiratory  Result Value Ref Range Status   Adenovirus NOT DETECTED NOT DETECTED Final   Coronavirus 229E NOT DETECTED NOT DETECTED Final    Comment: (NOTE) The Coronavirus on the Respiratory Panel, DOES NOT test for the novel  Coronavirus (2019 nCoV)    Coronavirus HKU1 NOT DETECTED NOT DETECTED Final   Coronavirus NL63 NOT DETECTED NOT DETECTED Final   Coronavirus OC43 NOT DETECTED NOT DETECTED Final   Metapneumovirus NOT DETECTED NOT DETECTED Final   Rhinovirus / Enterovirus NOT DETECTED NOT DETECTED Final   Influenza A NOT DETECTED NOT DETECTED Final   Influenza B NOT DETECTED NOT DETECTED Final   Parainfluenza Virus 1 NOT DETECTED NOT DETECTED Final   Parainfluenza Virus 2 NOT DETECTED NOT DETECTED Final   Parainfluenza Virus 3 NOT DETECTED NOT DETECTED Final   Parainfluenza Virus 4 NOT DETECTED NOT DETECTED Final   Respiratory Syncytial Virus NOT  DETECTED NOT DETECTED Final   Bordetella pertussis NOT DETECTED NOT DETECTED Final   Bordetella Parapertussis NOT DETECTED NOT DETECTED Final   Chlamydophila pneumoniae NOT DETECTED NOT DETECTED Final   Mycoplasma pneumoniae NOT DETECTED NOT DETECTED Final    Comment: Performed at [facility], [address] [place], [place] [address]    Labs: CBC: Recent Labs  Lab 07/27/23 [phone] 07/28/23 0504 07/29/23 0501 07/30/23 0449 07/31/23 0333  WBC 16.6* 20.6* 16.5* 14.9* 16.8*  NEUTROABS 14.4* 17.3* 12.7* 12.7* 14.3*  HGB 9.5* 10.6* 11.3* 12.2 12.5  HCT 30.7* 33.1* 35.5* 38.8 39.8  MCV 88.5 87.8 86.8 86.2 86.0  PLT 376 460* 421* 432* 420*   Basic Metabolic Panel: Recent Labs  Lab 07/27/23 0429 07/28/23 0504 07/29/23 0501 07/30/23 0449 07/31/23 0333  NA 138 138 138 136 136  K 3.7 4.1 4.0 4.0 4.5  CL 100 98 97* 96* 98  CO2  28 30 31 27 27   GLUCOSE 172* 143* 145* 177* 152*  BUN 9 15 16 19  24*  CREATININE 0.44 0.52 0.47 0.50 0.50  CALCIUM 8.3* 8.8* 8.7* 8.9 8.9  MG 2.1 2.2 2.1 2.2 2.4  PHOS  --   --   --   --  3.3   Liver Function Tests: Recent Labs  Lab 07/27/23 0429 07/28/23 0504 07/29/23 0501 07/30/23 0449 07/31/23 0333  AST 9* 9* 10* 10* 11*  ALT 17 21 23 24 24   ALKPHOS 81 88 88 88 84  BILITOT 0.3 0.2* 0.3 0.4 0.4  PROT 5.9* 6.5 6.7 6.8 6.8  ALBUMIN 2.5* 2.8* 2.9* 3.1* 3.2*   CBG: No results for input(s): "GLUCAP" in the last 168 hours.  Discharge time spent: greater than 30 minutes.  Signed: Catarina Hartshorn, MD [facility] 07/31/2023

## 2023-08-14 ENCOUNTER — Other Ambulatory Visit: Payer: Self-pay

## 2023-08-14 ENCOUNTER — Inpatient Hospital Stay (HOSPITAL_COMMUNITY)
Admission: EM | Admit: 2023-08-14 | Discharge: 2023-08-17 | DRG: 190 | Disposition: A | Discharge status: Home Health | Payer: MEDICAID | Attending: Family Medicine | Admitting: Family Medicine

## 2023-08-14 ENCOUNTER — Emergency Department (HOSPITAL_COMMUNITY): Payer: MEDICAID

## 2023-08-14 ENCOUNTER — Encounter (HOSPITAL_COMMUNITY): Payer: Self-pay

## 2023-08-14 DIAGNOSIS — Z91199 Patient's noncompliance with other medical treatment and regimen due to unspecified reason: Secondary | ICD-10-CM | POA: Diagnosis not present

## 2023-08-14 DIAGNOSIS — Z820 Family history of epilepsy and other diseases of the nervous system: Secondary | ICD-10-CM | POA: Diagnosis not present

## 2023-08-14 DIAGNOSIS — E46 Unspecified protein-calorie malnutrition: Secondary | ICD-10-CM | POA: Diagnosis present

## 2023-08-14 DIAGNOSIS — F17210 Nicotine dependence, cigarettes, uncomplicated: Secondary | ICD-10-CM | POA: Diagnosis present

## 2023-08-14 DIAGNOSIS — Z833 Family history of diabetes mellitus: Secondary | ICD-10-CM

## 2023-08-14 DIAGNOSIS — Z96612 Presence of left artificial shoulder joint: Secondary | ICD-10-CM | POA: Diagnosis present

## 2023-08-14 DIAGNOSIS — E162 Hypoglycemia, unspecified: Secondary | ICD-10-CM | POA: Diagnosis present

## 2023-08-14 DIAGNOSIS — Z823 Family history of stroke: Secondary | ICD-10-CM | POA: Diagnosis not present

## 2023-08-14 DIAGNOSIS — R531 Weakness: Secondary | ICD-10-CM | POA: Diagnosis present

## 2023-08-14 DIAGNOSIS — M549 Dorsalgia, unspecified: Secondary | ICD-10-CM | POA: Diagnosis present

## 2023-08-14 DIAGNOSIS — G8929 Other chronic pain: Secondary | ICD-10-CM | POA: Diagnosis present

## 2023-08-14 DIAGNOSIS — E876 Hypokalemia: Secondary | ICD-10-CM | POA: Diagnosis present

## 2023-08-14 DIAGNOSIS — Z8349 Family history of other endocrine, nutritional and metabolic diseases: Secondary | ICD-10-CM | POA: Diagnosis not present

## 2023-08-14 DIAGNOSIS — F339 Major depressive disorder, recurrent, unspecified: Secondary | ICD-10-CM | POA: Diagnosis present

## 2023-08-14 DIAGNOSIS — F411 Generalized anxiety disorder: Secondary | ICD-10-CM

## 2023-08-14 DIAGNOSIS — Z8249 Family history of ischemic heart disease and other diseases of the circulatory system: Secondary | ICD-10-CM

## 2023-08-14 DIAGNOSIS — J441 Chronic obstructive pulmonary disease with (acute) exacerbation: Principal | ICD-10-CM | POA: Diagnosis present

## 2023-08-14 DIAGNOSIS — Z720 Tobacco use: Secondary | ICD-10-CM | POA: Diagnosis present

## 2023-08-14 DIAGNOSIS — F410 Panic disorder [episodic paroxysmal anxiety] without agoraphobia: Secondary | ICD-10-CM | POA: Diagnosis present

## 2023-08-14 DIAGNOSIS — R739 Hyperglycemia, unspecified: Secondary | ICD-10-CM | POA: Diagnosis present

## 2023-08-14 DIAGNOSIS — F5101 Primary insomnia: Secondary | ICD-10-CM

## 2023-08-14 DIAGNOSIS — T380X5A Adverse effect of glucocorticoids and synthetic analogues, initial encounter: Secondary | ICD-10-CM | POA: Diagnosis present

## 2023-08-14 DIAGNOSIS — D649 Anemia, unspecified: Secondary | ICD-10-CM | POA: Diagnosis present

## 2023-08-14 DIAGNOSIS — J9621 Acute and chronic respiratory failure with hypoxia: Secondary | ICD-10-CM | POA: Diagnosis present

## 2023-08-14 DIAGNOSIS — Z79899 Other long term (current) drug therapy: Secondary | ICD-10-CM | POA: Diagnosis not present

## 2023-08-14 DIAGNOSIS — Z7952 Long term (current) use of systemic steroids: Secondary | ICD-10-CM

## 2023-08-14 HISTORY — DX: Chronic obstructive pulmonary disease, unspecified: J44.9

## 2023-08-14 LAB — URINALYSIS, ROUTINE W REFLEX MICROSCOPIC
Bilirubin Urine: NEGATIVE
Glucose, UA: 150 mg/dL — AB
Hgb urine dipstick: NEGATIVE
Ketones, ur: 5 mg/dL — AB
Leukocytes,Ua: NEGATIVE
Nitrite: NEGATIVE
Protein, ur: NEGATIVE mg/dL
Specific Gravity, Urine: 1.004 — ABNORMAL LOW (ref 1.005–1.030)
pH: 7 (ref 5.0–8.0)

## 2023-08-14 LAB — RAPID URINE DRUG SCREEN, HOSP PERFORMED
Amphetamines: NOT DETECTED
Barbiturates: NOT DETECTED
Benzodiazepines: NOT DETECTED
Cocaine: NOT DETECTED
Opiates: NOT DETECTED
Tetrahydrocannabinol: NOT DETECTED

## 2023-08-14 LAB — CBC WITH DIFFERENTIAL/PLATELET
Abs Immature Granulocytes: 0.03 10*3/uL (ref 0.00–0.07)
Basophils Absolute: 0 10*3/uL (ref 0.0–0.1)
Basophils Relative: 0 %
Eosinophils Absolute: 0.1 10*3/uL (ref 0.0–0.5)
Eosinophils Relative: 1 %
HCT: 35.6 % — ABNORMAL LOW (ref 36.0–46.0)
Hemoglobin: 11.8 g/dL — ABNORMAL LOW (ref 12.0–15.0)
Immature Granulocytes: 0 %
Lymphocytes Relative: 13 %
Lymphs Abs: 1.2 10*3/uL (ref 0.7–4.0)
MCH: 28.7 pg (ref 26.0–34.0)
MCHC: 33.1 g/dL (ref 30.0–36.0)
MCV: 86.6 fL (ref 80.0–100.0)
Monocytes Absolute: 0.3 10*3/uL (ref 0.1–1.0)
Monocytes Relative: 3 %
Neutro Abs: 7.5 10*3/uL (ref 1.7–7.7)
Neutrophils Relative %: 83 %
Platelets: 324 10*3/uL (ref 150–400)
RBC: 4.11 MIL/uL (ref 3.87–5.11)
RDW: 15.9 % — ABNORMAL HIGH (ref 11.5–15.5)
WBC: 9.1 10*3/uL (ref 4.0–10.5)
nRBC: 0 % (ref 0.0–0.2)

## 2023-08-14 LAB — COMPREHENSIVE METABOLIC PANEL
ALT: 27 U/L (ref 0–44)
AST: 12 U/L — ABNORMAL LOW (ref 15–41)
Albumin: 3.3 g/dL — ABNORMAL LOW (ref 3.5–5.0)
Alkaline Phosphatase: 86 U/L (ref 38–126)
Anion gap: 10 (ref 5–15)
BUN: 6 mg/dL — ABNORMAL LOW (ref 8–23)
CO2: 29 mmol/L (ref 22–32)
Calcium: 8.5 mg/dL — ABNORMAL LOW (ref 8.9–10.3)
Chloride: 99 mmol/L (ref 98–111)
Creatinine, Ser: 0.46 mg/dL (ref 0.44–1.00)
GFR, Estimated: >60 mL/min (ref >60)
Glucose, Bld: 153 mg/dL — ABNORMAL HIGH (ref 70–99)
Potassium: 3.1 mmol/L — ABNORMAL LOW (ref 3.5–5.1)
Sodium: 138 mmol/L (ref 135–145)
Total Bilirubin: 0.5 mg/dL (ref 0.3–1.2)
Total Protein: 6.7 g/dL (ref 6.5–8.1)

## 2023-08-14 LAB — CBG MONITORING, ED: Glucose-Capillary: 239 mg/dL — ABNORMAL HIGH (ref 70–99)

## 2023-08-14 MED ORDER — ONDANSETRON HCL 4 MG/2ML IJ SOLN: 4.0000 mg | Freq: Four times a day (QID) | INTRAMUSCULAR | Status: DC | PRN

## 2023-08-14 MED ORDER — IPRATROPIUM-ALBUTEROL 0.5-2.5 (3) MG/3ML IN SOLN
3.0000 mL | Freq: Once | RESPIRATORY_TRACT | Status: AC
  Administered 2023-08-14: 3 mL via RESPIRATORY_TRACT
  Filled 2023-08-14: qty 3

## 2023-08-14 MED ORDER — METHYLPREDNISOLONE SODIUM SUCC 125 MG IJ SOLR
125.0000 mg | Freq: Once | INTRAMUSCULAR | Status: AC
  Administered 2023-08-14: 125 mg via INTRAVENOUS
  Filled 2023-08-14: qty 2

## 2023-08-14 MED ORDER — ALBUTEROL SULFATE (2.5 MG/3ML) 0.083% IN NEBU
2.5000 mg | INHALATION_SOLUTION | Freq: Once | RESPIRATORY_TRACT | Status: AC
  Administered 2023-08-14: 2.5 mg via RESPIRATORY_TRACT
  Filled 2023-08-14: qty 3

## 2023-08-14 MED ORDER — POTASSIUM CHLORIDE CRYS ER 20 MEQ PO TBCR
40.0000 meq | EXTENDED_RELEASE_TABLET | ORAL | Status: AC
  Administered 2023-08-14 (×2): 40 meq via ORAL
  Filled 2023-08-14 (×2): qty 2

## 2023-08-14 MED ORDER — POTASSIUM CHLORIDE CRYS ER 20 MEQ PO TBCR
40.0000 meq | EXTENDED_RELEASE_TABLET | Freq: Once | ORAL | Status: AC
  Administered 2023-08-14: 40 meq via ORAL
  Filled 2023-08-14: qty 2

## 2023-08-14 MED ORDER — ACETAMINOPHEN 650 MG RE SUPP: 650.0000 mg | Freq: Four times a day (QID) | RECTAL | Status: DC | PRN

## 2023-08-14 MED ORDER — SODIUM CHLORIDE 0.9% FLUSH
3.0000 mL | Freq: Two times a day (BID) | INTRAVENOUS | Status: DC
  Administered 2023-08-14 – 2023-08-16 (×5): 3 mL via INTRAVENOUS

## 2023-08-14 MED ORDER — POLYETHYLENE GLYCOL 3350 17 G PO PACK: 17.0000 g | PACK | Freq: Every day | ORAL | Status: DC | PRN

## 2023-08-14 MED ORDER — FLUTICASONE FUROATE-VILANTEROL 100-25 MCG/ACT IN AEPB
1.0000 | INHALATION_SPRAY | Freq: Every day | RESPIRATORY_TRACT | Status: DC
  Administered 2023-08-15 – 2023-08-17 (×3): 1 via RESPIRATORY_TRACT
  Filled 2023-08-14: qty 28

## 2023-08-14 MED ORDER — CLONAZEPAM 0.5 MG PO TABS
0.5000 mg | ORAL_TABLET | Freq: Once | ORAL | Status: AC
  Administered 2023-08-14: 0.5 mg via ORAL
  Filled 2023-08-14: qty 1

## 2023-08-14 MED ORDER — SODIUM CHLORIDE 0.9% FLUSH
3.0000 mL | Freq: Two times a day (BID) | INTRAVENOUS | Status: DC
  Administered 2023-08-14 – 2023-08-17 (×7): 3 mL via INTRAVENOUS

## 2023-08-14 MED ORDER — DM-GUAIFENESIN ER 30-600 MG PO TB12
1.0000 | ORAL_TABLET | Freq: Two times a day (BID) | ORAL | Status: DC
  Administered 2023-08-14 – 2023-08-17 (×6): 1 via ORAL
  Filled 2023-08-14 (×6): qty 1

## 2023-08-14 MED ORDER — HEPARIN SODIUM (PORCINE) 5000 UNIT/ML IJ SOLN
5000.0000 [IU] | Freq: Three times a day (TID) | INTRAMUSCULAR | Status: DC
  Administered 2023-08-14 – 2023-08-16 (×7): 5000 [IU] via SUBCUTANEOUS
  Filled 2023-08-14 (×8): qty 1

## 2023-08-14 MED ORDER — SODIUM CHLORIDE 0.9% FLUSH: 3.0000 mL | INTRAVENOUS | Status: DC | PRN

## 2023-08-14 MED ORDER — SODIUM CHLORIDE 0.9 % IV SOLN: INTRAVENOUS | Status: DC | PRN

## 2023-08-14 MED ORDER — DOXYCYCLINE HYCLATE 100 MG PO TABS
100.0000 mg | ORAL_TABLET | Freq: Two times a day (BID) | ORAL | Status: DC
  Administered 2023-08-14 – 2023-08-17 (×6): 100 mg via ORAL
  Filled 2023-08-14 (×6): qty 1

## 2023-08-14 MED ORDER — TRAZODONE HCL 50 MG PO TABS
50.0000 mg | ORAL_TABLET | Freq: Every evening | ORAL | Status: DC | PRN
  Administered 2023-08-14 – 2023-08-16 (×3): 50 mg via ORAL
  Filled 2023-08-14 (×3): qty 1

## 2023-08-14 MED ORDER — BISACODYL 10 MG RE SUPP: 10.0000 mg | Freq: Every day | RECTAL | Status: DC | PRN

## 2023-08-14 MED ORDER — HYDROXYZINE HCL 25 MG PO TABS
50.0000 mg | ORAL_TABLET | Freq: Three times a day (TID) | ORAL | Status: DC | PRN
  Administered 2023-08-15 – 2023-08-16 (×3): 50 mg via ORAL
  Filled 2023-08-14 (×3): qty 2

## 2023-08-14 MED ORDER — ALBUTEROL SULFATE (2.5 MG/3ML) 0.083% IN NEBU
2.5000 mg | INHALATION_SOLUTION | RESPIRATORY_TRACT | Status: DC | PRN
  Administered 2023-08-14 – 2023-08-16 (×3): 2.5 mg via RESPIRATORY_TRACT
  Filled 2023-08-14 (×3): qty 3

## 2023-08-14 MED ORDER — METHYLPREDNISOLONE SODIUM SUCC 40 MG IJ SOLR
40.0000 mg | Freq: Two times a day (BID) | INTRAMUSCULAR | Status: DC
  Administered 2023-08-14 – 2023-08-17 (×6): 40 mg via INTRAVENOUS
  Filled 2023-08-14 (×6): qty 1

## 2023-08-14 MED ORDER — ONDANSETRON HCL 4 MG PO TABS: 4.0000 mg | ORAL_TABLET | Freq: Four times a day (QID) | ORAL | Status: DC | PRN

## 2023-08-14 MED ORDER — IPRATROPIUM-ALBUTEROL 0.5-2.5 (3) MG/3ML IN SOLN
3.0000 mL | Freq: Four times a day (QID) | RESPIRATORY_TRACT | Status: DC
  Administered 2023-08-14 – 2023-08-17 (×11): 3 mL via RESPIRATORY_TRACT
  Filled 2023-08-14 (×11): qty 3

## 2023-08-14 MED ORDER — ACETAMINOPHEN 325 MG PO TABS
650.0000 mg | ORAL_TABLET | Freq: Four times a day (QID) | ORAL | Status: DC | PRN
  Administered 2023-08-14 – 2023-08-17 (×5): 650 mg via ORAL
  Filled 2023-08-14 (×5): qty 2

## 2023-08-14 MED ORDER — LORAZEPAM 2 MG/ML IJ SOLN
0.5000 mg | Freq: Once | INTRAMUSCULAR | Status: AC
  Administered 2023-08-14: 0.5 mg via INTRAVENOUS
  Filled 2023-08-14: qty 1

## 2023-08-14 MED ORDER — CLONAZEPAM 0.5 MG PO TABS
0.5000 mg | ORAL_TABLET | Freq: Two times a day (BID) | ORAL | Status: DC | PRN
  Administered 2023-08-14 – 2023-08-17 (×7): 0.5 mg via ORAL
  Filled 2023-08-14 (×7): qty 1

## 2023-08-14 MED ORDER — OXYCODONE-ACETAMINOPHEN 5-325 MG PO TABS
1.0000 | ORAL_TABLET | Freq: Once | ORAL | Status: AC
  Administered 2023-08-14: 1 via ORAL
  Filled 2023-08-14: qty 1

## 2023-08-14 MED ORDER — NICOTINE 21 MG/24HR TD PT24
21.0000 mg | MEDICATED_PATCH | Freq: Every day | TRANSDERMAL | Status: DC
  Administered 2023-08-14 – 2023-08-17 (×4): 21 mg via TRANSDERMAL
  Filled 2023-08-14 (×4): qty 1

## 2023-08-14 MED ORDER — MAGNESIUM SULFATE 2 GM/50ML IV SOLN
2.0000 g | Freq: Once | INTRAVENOUS | Status: AC
  Administered 2023-08-14: 2 g via INTRAVENOUS
  Filled 2023-08-14: qty 50

## 2023-08-14 MED ORDER — ALBUTEROL SULFATE HFA 108 (90 BASE) MCG/ACT IN AERS: 2.0000 | INHALATION_SPRAY | RESPIRATORY_TRACT | Status: DC | PRN

## 2023-08-14 MED ORDER — CITALOPRAM HYDROBROMIDE 20 MG PO TABS
20.0000 mg | ORAL_TABLET | Freq: Every day | ORAL | Status: DC
  Administered 2023-08-15 – 2023-08-17 (×3): 20 mg via ORAL
  Filled 2023-08-14 (×3): qty 1

## 2023-08-14 NOTE — ED Notes (Signed)
Pt up to BSC, O2 sat dropped to 84% on 5 L [place]

## 2023-08-14 NOTE — ED Notes (Signed)
Pt sating at 89 on 4L, O2 upped to 5L [place] sating at 90%

## 2023-08-14 NOTE — ED Provider Notes (Signed)
EMERGENCY DEPARTMENT AT [facility] Provider Note   CSN: [id number] Arrival date & time: 08/14/23  1138     History {Add pertinent medical, surgical, social history, OB history to HPI:1} Chief Complaint  Patient presents with   Shortness of Breath    Casey [name] is a 62 y.o. female.  Patient has a history of COPD.  She was recently admitted with pneumonia.  She states that she became short of breath recently but is only coughing up clear material.  Patient was sent home on 2 L nasal after her last admission   Shortness of Breath      Home Medications Prior to Admission medications   Medication Sig Start Date End Date Taking? Authorizing Provider  albuterol (VENTOLIN HFA) 108 (90 Base) MCG/ACT inhaler Inhale 2 puffs into the lungs every 6 (six) hours as needed for wheezing or shortness of breath. 07/12/23   Ghimire, Werner Lean, MD  azithromycin (ZITHROMAX) 500 MG tablet Take 1 tablet (500 mg total) by mouth daily. 08/01/23   Catarina Hartshorn, MD  cefadroxil (DURICEF) 500 MG capsule Take 2 capsules (1,000 mg total) by mouth 2 (two) times daily. 07/31/23   Catarina Hartshorn, MD  cholecalciferol (VITAMIN D3) 25 MCG (1000 UNIT) tablet Take 1,000 Units by mouth daily.    [provider]  citalopram (CELEXA) 20 MG tablet TAKE 1 TABLET BY MOUTH EVERY DAY 03/01/23   Rakes, Doralee Albino, FNP  clonazePAM (KLONOPIN) 0.5 MG tablet Take 1 tablet (0.5 mg total) by mouth 2 (two) times daily. 07/31/23   Catarina Hartshorn, MD  feeding supplement (ENSURE ENLIVE / ENSURE PLUS) LIQD Take 237 mLs by mouth 3 (three) times daily between meals. 07/31/23   Catarina Hartshorn, MD  fluticasone furoate-vilanterol (BREO ELLIPTA) 100-25 MCG/ACT AEPB Inhale 1 puff into the lungs daily. 07/20/23   Sherryll Burger, Pratik D, DO  guaifenesin (ROBITUSSIN) 100 MG/5ML syrup Take 200 mg by mouth 3 (three) times daily as needed for cough.    [provider]  hydrOXYzine (ATARAX) 50 MG tablet Take 1 tablet (50 mg total) by mouth 3  (three) times daily as needed. 07/12/23   Ghimire, Werner Lean, MD  ibuprofen (ADVIL) 200 MG tablet Take 200 mg by mouth every 6 (six) hours as needed for fever, headache or mild pain.    [provider]  predniSONE (DELTASONE) 50 MG tablet Take 1 tablet (50 mg total) by mouth daily with breakfast. 08/01/23   Catarina Hartshorn, MD      Allergies    Patient has no known allergies.    Review of Systems   Review of Systems  Respiratory:  Positive for shortness of breath.     Physical Exam Updated Vital Signs BP 138/83   Pulse 91   Temp 97.8 F (36.6 C) (Oral)   Resp 20   Ht 5\' 5"  (1.651 m)   Wt 56.7 kg   LMP  (LMP Unknown)   SpO2 95%   BMI 20.80 kg/m  Physical Exam  ED Results / Procedures / Treatments   Labs (all labs ordered are listed, but only abnormal results are displayed) Labs Reviewed  CBC WITH DIFFERENTIAL/PLATELET - Abnormal; Notable for the following components:      Result Value   Hemoglobin 11.8 (*)    HCT 35.6 (*)    RDW 15.9 (*)    All other components within normal limits  COMPREHENSIVE METABOLIC PANEL - Abnormal; Notable for the following components:   Potassium 3.1 (*)  Glucose, Bld 153 (*)    BUN 6 (*)    Calcium 8.5 (*)    Albumin 3.3 (*)    AST 12 (*)    All other components within normal limits  RAPID URINE DRUG SCREEN, HOSP PERFORMED  URINALYSIS, ROUTINE W REFLEX MICROSCOPIC    EKG None  Radiology DG Chest 2 View  Result Date: 08/14/2023 CLINICAL DATA:  Shortness of breath EXAM: CHEST - 2 VIEW COMPARISON:  X-ray 07/26/2023 FINDINGS: No pneumothorax or effusion. Normal cardiopericardial silhouette. Diffuse interstitial changes are again seen and likely chronic. No new consolidation. Left shoulder reverse arthroplasty. IMPRESSION: Interstitial changes again seen, possibly chronic. Electronically Signed   By: Karen Kays M.D.   On: 08/14/2023 13:13    Procedures Procedures  {Document cardiac monitor, telemetry assessment procedure when  appropriate:1}  Medications Ordered in ED Medications  ipratropium-albuterol (DUONEB) 0.5-2.5 (3) MG/3ML nebulizer solution 3 mL (has no administration in time range)  albuterol (PROVENTIL) (2.5 MG/3ML) 0.083% nebulizer solution 2.5 mg (has no administration in time range)  potassium chloride SA (KLOR-CON M) CR tablet 40 mEq (has no administration in time range)  methylPREDNISolone sodium succinate (SOLU-MEDROL) 40 mg/mL injection 40 mg (has no administration in time range)  dextromethorphan-guaiFENesin (MUCINEX DM) 30-600 MG per 12 hr tablet 1 tablet (has no administration in time range)  doxycycline (VIBRA-TABS) tablet 100 mg (has no administration in time range)  oxyCODONE-acetaminophen (PERCOCET/ROXICET) 5-325 MG per tablet 1 tablet (1 tablet Oral Given 08/14/23 1234)  ipratropium-albuterol (DUONEB) 0.5-2.5 (3) MG/3ML nebulizer solution 3 mL (3 mLs Nebulization Given 08/14/23 1240)  albuterol (PROVENTIL) (2.5 MG/3ML) 0.083% nebulizer solution 2.5 mg (2.5 mg Nebulization Given 08/14/23 1240)  methylPREDNISolone sodium succinate (SOLU-MEDROL) 125 mg/2 mL injection 125 mg (125 mg Intravenous Given 08/14/23 1234)  magnesium sulfate IVPB 2 g 50 mL (0 g Intravenous Stopped 08/14/23 1340)  potassium chloride SA (KLOR-CON M) CR tablet 40 mEq (40 mEq Oral Given 08/14/23 1425)  clonazePAM (KLONOPIN) tablet 0.5 mg (0.5 mg Oral Given 08/14/23 1425)    ED Course/ Medical Decision Making/ A&P   {  CRITICAL CARE Performed by: Bethann Berkshire Total critical care time: 45 minutes Critical care time was exclusive of separately billable procedures and treating other patients. Critical care was necessary to treat or prevent imminent or life-threatening deterioration. Critical care was time spent personally by me on the following activities: development of treatment plan with patient and/or surrogate as well as nursing, discussions with consultants, evaluation of patient's response to treatment, examination of  patient, obtaining history from patient or surrogate, ordering and performing treatments and interventions, ordering and review of laboratory studies, ordering and review of radiographic studies, pulse oximetry and re-evaluation of patient's condition.  Click here for ABCD2, HEART and other calculatorsREFRESH Note before signing :1}                              Medical Decision Making Amount and/or Complexity of Data Reviewed Labs: ordered. Radiology: ordered.  Risk Prescription drug management. Decision regarding hospitalization.   Patient with COPD exacerbation and worsening hypoxia.  Patient needs 5 L nasal keep her O2 sats above 92%.  She will be admitted to medicine and treated for COPD exacerbation  {Document critical care time when appropriate:1} {Document review of labs and clinical decision tools ie heart score, Chads2Vasc2 etc:1}  {Document your independent review of radiology images, and any outside records:1} {Document your discussion with family members, caretakers, and  with consultants:1} {Document social determinants of health affecting pt's care:1} {Document your decision making why or why not admission, treatments were needed:1} Final Clinical Impression(s) / ED Diagnoses Final diagnoses:  COPD exacerbation (HCC)    Rx / DC Orders ED Discharge Orders     None

## 2023-08-14 NOTE — H&P (Signed)
Patient Demographics:    Casey [name], is a 62 y.o. female  MRN: [id number]   DOB - [birth year]  Admit Date - 08/14/2023  Outpatient Primary MD for the patient is Patient, No Pcp Per   Assessment & Plan:   Assessment and Plan:  1)Acute COPD Exacerbation---- no definite pneumonia,  treat empirically with IV Solu-Medrol , give mucolytics, doxycycline and bronchodilators as ordered, supplemental oxygen as ordered.  - discharged on 07/31/2023 after treatment for pneumonia  2)Acute on chronic respiratory failure with hypoxia  - -Due to #1 above -She was supposed to be on 2 L of oxygen at home, query compliance, -Currently requiring up to 5 L of oxygen at this time due to #1 above --On 4 L of oxygen after bronchodilators O2 sats was down to 88 and 89% patient was increased to 5 L of oxygen with O2 sats up to 90%, however even on 5 L of oxygen when patient got up to the bedside commode O2 sat dropped to 84% on 5 L   3) hypokalemia--- replaced, potassium will drift further than due to bronchodilators   4) chronic normocytic anemia  - -Hgb at baseline -No obvious bleeding concerns at this time -Transfuse as clinically indicated   5) tobacco abuse--smoking cessation advised especially while on O2 -Nicotine patch recommended   6)Hyperglycemia--recent A1c 6.0 -Anticipate worsening hyperglycemia with steroids Use Novolog/Humalog Sliding scale insulin with Accu-Cheks/Fingersticks as ordered    7) protein caloric malnutrition--- nutritional supplements advised -Smoking cessation will help to improve her appetite  Status is: Inpatient  Remains inpatient appropriate because:   Dispo: The patient is from: Home              Anticipated d/c is to: Home              Anticipated d/c date is: 2 days              Patient  currently is not medically stable to d/c. Barriers: Not Clinically Stable-   With History of - Reviewed by me  Past Medical History:  Diagnosis Date   Anxiety    Chronic back pain    COPD (chronic obstructive pulmonary disease) (HCC)    Depression    History of panic attacks 09/11/2014   Medical history non-contributory    Pneumonia    Polysubstance abuse (HCC)    History of taking non-prescribed opiates, BZDs; also +cocaine and THC   Ruptured disc, thoracic    Suicide attempt (HCC) 2002      Past Surgical History:  Procedure Laterality Date   CESAREAN SECTION     CLAVICLE SURGERY     REVERSE SHOULDER ARTHROPLASTY Left 01/01/2020   Procedure: LEFT REVERSE SHOULDER ARTHROPLASTY;  Surgeon: Cammy Copa, MD;  Location: [facility] OR;  Service: Orthopedics;  Laterality: Left;   TUBAL LIGATION      Chief Complaint  Patient presents with   Shortness of Breath  HPI:    Casey [name]  is a 62 y.o. female with Pmhx relevant for ongoing tobacco abuse, COPD, hypoxic respiratory failure supposed to be on 2 L of oxygen at home, history of polysubstance abuse including benzos opioids cocaine and THC (UDS is actually negative today) who was recently treated at this hospital for acute hypoxic respiratory failure/COPD exacerbation secondary to pneumonia from 07/26/2023 through 07/31/2023 she returns today with worsening hypoxia -She is supposed to be using 2 L of oxygen via nasal cannula appears not to be compliant due to tobacco use -Currently in the ED requiring up to 5 L of oxygen via nasal cannula -Two-view chest x-ray without new consolidation, chronic COPD/interstitial type changes noted -CBC with a WBC of 9.1 Hgb of 11.8 and platelets of 324 -Potassium is 3.1 sodium 138 bicarb 29 creatinine 0.46 with a glucose of 153 LFTs are not elevated -UA with glucosuria and ketones but not suggestive of UTI -Patient reports productive cough and worsening dyspnea -On 4 L of oxygen after  bronchodilators O2 sats was down to 88 and 89% patient was increased to 5 L of oxygen with O2 sats up to 90%, however even on 5 L of oxygen when patient got up to the bedside commode O2 sat dropped to 84% on 5 L   Review of systems:    In addition to the HPI above,   A full Review of  Systems was done, all other systems reviewed are negative except as noted above in HPI , .    Social History:  Reviewed by me    Social History   Tobacco Use   Smoking status: Former    Current packs/day: 0.25    Types: Cigarettes   Smokeless tobacco: Never   Tobacco comments:    2-3 cigarettes per day  Substance Use Topics   Alcohol use: Yes    Alcohol/week: 1.0 standard drink of alcohol    Types: 1 Cans of beer per week    Comment: occ        Family History :  Reviewed by me    Family History  Problem Relation Age of Onset   Diabetes Mother    Pulmonary fibrosis Mother    Heart attack Father    Parkinson's disease Sister    Bipolar disorder Sister    Thyroid disease Sister    Heart attack Brother    Other Son        vascular neucrosis   Pulmonary fibrosis Maternal Grandmother    Diabetes Maternal Grandfather    Stroke Maternal Grandfather    Pneumonia Paternal Grandfather    Cancer Sister        melonoma   Other Brother        back problems, nerve problems, soft bones    Home Medications:   Prior to Admission medications   Medication Sig Start Date End Date Taking? Authorizing Provider  albuterol (VENTOLIN HFA) 108 (90 Base) MCG/ACT inhaler Inhale 2 puffs into the lungs every 6 (six) hours as needed for wheezing or shortness of breath. 07/12/23  Yes Ghimire, Werner Lean, MD  cholecalciferol (VITAMIN D3) 25 MCG (1000 UNIT) tablet Take 1,000 Units by mouth daily.   Yes [provider]  citalopram (CELEXA) 20 MG tablet TAKE 1 TABLET BY MOUTH EVERY DAY 03/01/23  Yes Rakes, Doralee Albino, FNP  clonazePAM (KLONOPIN) 0.5 MG tablet Take 1 tablet (0.5 mg total) by mouth 2 (two) times  daily. 07/31/23  Yes Catarina Hartshorn, MD  feeding  supplement (ENSURE ENLIVE / ENSURE PLUS) LIQD Take 237 mLs by mouth 3 (three) times daily between meals. 07/31/23  Yes Tat, Onalee Hua, MD  fluticasone furoate-vilanterol (BREO ELLIPTA) 100-25 MCG/ACT AEPB Inhale 1 puff into the lungs daily. 07/20/23  Yes Shah, Pratik D, DO  guaifenesin (ROBITUSSIN) 100 MG/5ML syrup Take 200 mg by mouth 3 (three) times daily as needed for cough.   Yes [provider]  hydrOXYzine (ATARAX) 50 MG tablet Take 1 tablet (50 mg total) by mouth 3 (three) times daily as needed. 07/12/23  Yes Ghimire, Werner Lean, MD  ibuprofen (ADVIL) 200 MG tablet Take 200 mg by mouth every 6 (six) hours as needed for fever, headache or mild pain.   Yes [provider]     Allergies:    No Known Allergies   Physical Exam:   Vitals  Blood pressure 138/83, pulse (!) 117, temperature 98 F (36.7 C), resp. rate 20, height 5\' 5"  (1.651 m), weight 56.7 kg, SpO2 (!) 87%.  Physical Examination: General appearance - alert, conversational dyspnea Mental status - alert, oriented to person, place, and time, somewhat anxious Nose-[place] 5L/min Eyes - sclera anicteric Neck - supple, no JVD elevation , Chest -diminished breath sounds with scattered wheezes bilaterally Heart - S1 and S2 normal, regular , tachycardic Abdomen - soft, nontender, nondistended, +BS Neurological - screening mental status exam normal, neck supple without rigidity, cranial nerves II through XII intact, DTR's normal and symmetric Extremities - no pedal edema noted, intact peripheral pulses  Skin - warm, dry     Data Review:    CBC Recent Labs  Lab 08/14/23 1233  WBC 9.1  HGB 11.8*  HCT 35.6*  PLT 324  MCV 86.6  MCH 28.7  MCHC 33.1  RDW 15.9*  LYMPHSABS 1.2  MONOABS 0.3  EOSABS 0.1  BASOSABS 0.0   ------------------------------------------------------------------------------------------------------------------  Chemistries  Recent Labs  Lab  08/14/23 1233  NA 138  K 3.1*  CL 99  CO2 29  GLUCOSE 153*  BUN 6*  CREATININE 0.46  CALCIUM 8.5*  AST 12*  ALT 27  ALKPHOS 86  BILITOT 0.5   ------------------------------------------------------------------------------------------------------------------ estimated creatinine clearance is 63.6 mL/min (by C-G formula based on SCr of 0.46 mg/dL). ------------------------------------------------------------------------------------------------------------------  ------------------------------------------------------------------------------------------------------------------    Component Value Date/Time   BNP 154.0 (H) 07/28/2023 1353    Urinalysis    Component Value Date/Time   COLORURINE STRAW (A) 08/14/2023 1620   APPEARANCEUR CLEAR 08/14/2023 1620   LABSPEC 1.004 (L) 08/14/2023 1620   PHURINE 7.0 08/14/2023 1620   GLUCOSEU 150 (A) 08/14/2023 1620   HGBUR NEGATIVE 08/14/2023 1620   BILIRUBINUR NEGATIVE 08/14/2023 1620   KETONESUR 5 (A) 08/14/2023 1620   PROTEINUR NEGATIVE 08/14/2023 1620   UROBILINOGEN 0.2 05/02/2009 1015   NITRITE NEGATIVE 08/14/2023 1620   LEUKOCYTESUR NEGATIVE 08/14/2023 1620    ----------------------------------------------------------------------------------------------------------------   Imaging Results:    DG Chest 2 View  Result Date: 08/14/2023 CLINICAL DATA:  Shortness of breath EXAM: CHEST - 2 VIEW COMPARISON:  X-ray 07/26/2023 FINDINGS: No pneumothorax or effusion. Normal cardiopericardial silhouette. Diffuse interstitial changes are again seen and likely chronic. No new consolidation. Left shoulder reverse arthroplasty. IMPRESSION: Interstitial changes again seen, possibly chronic. Electronically Signed   By: Karen Kays M.D.   On: 08/14/2023 13:13    Radiological Exams on Admission: DG Chest 2 View  Result Date: 08/14/2023 CLINICAL DATA:  Shortness of breath EXAM: CHEST - 2 VIEW COMPARISON:  X-ray 07/26/2023 FINDINGS: No  pneumothorax or effusion. Normal  cardiopericardial silhouette. Diffuse interstitial changes are again seen and likely chronic. No new consolidation. Left shoulder reverse arthroplasty. IMPRESSION: Interstitial changes again seen, possibly chronic. Electronically Signed   By: Karen Kays M.D.   On: 08/14/2023 13:13    DVT Prophylaxis -SCD /heparin AM Labs Ordered, also please review Full Orders  Family Communication: Admission, patients condition and plan of care including tests being ordered have been discussed with the patient  who indicate understanding and agree with the plan   Condition     Shon Hale M.D on 08/14/2023 at 4:52 PM Go to [email or web address] -  for contact info  [facility] - Office  [phone]

## 2023-08-14 NOTE — ED Triage Notes (Signed)
Pt arrived [facility] for SOB, productive cough. Pt usually uses 2 lpm and now needs 4 lpm to be 90%.

## 2023-08-15 ENCOUNTER — Encounter (HOSPITAL_COMMUNITY): Payer: Self-pay | Admitting: Family Medicine

## 2023-08-15 DIAGNOSIS — Z720 Tobacco use: Secondary | ICD-10-CM | POA: Diagnosis present

## 2023-08-15 DIAGNOSIS — J441 Chronic obstructive pulmonary disease with (acute) exacerbation: Secondary | ICD-10-CM | POA: Diagnosis not present

## 2023-08-15 LAB — BASIC METABOLIC PANEL
Anion gap: 9 (ref 5–15)
BUN: 11 mg/dL (ref 8–23)
CO2: 28 mmol/L (ref 22–32)
Calcium: 8.7 mg/dL — ABNORMAL LOW (ref 8.9–10.3)
Chloride: 102 mmol/L (ref 98–111)
Creatinine, Ser: 0.5 mg/dL (ref 0.44–1.00)
GFR, Estimated: >60 mL/min (ref >60)
Glucose, Bld: 183 mg/dL — ABNORMAL HIGH (ref 70–99)
Potassium: 4.4 mmol/L (ref 3.5–5.1)
Sodium: 139 mmol/L (ref 135–145)

## 2023-08-15 LAB — CBG MONITORING, ED
Glucose-Capillary: 207 mg/dL — ABNORMAL HIGH (ref 70–99)
Glucose-Capillary: 155 mg/dL — ABNORMAL HIGH (ref 70–99)

## 2023-08-15 LAB — GLUCOSE, CAPILLARY
Glucose-Capillary: 118 mg/dL — ABNORMAL HIGH (ref 70–99)
Glucose-Capillary: 136 mg/dL — ABNORMAL HIGH (ref 70–99)

## 2023-08-15 MED ORDER — MELATONIN 3 MG PO TABS
6.0000 mg | ORAL_TABLET | Freq: Once | ORAL | Status: AC
  Administered 2023-08-15: 6 mg via ORAL
  Filled 2023-08-15: qty 2

## 2023-08-15 MED ORDER — GUAIFENESIN 100 MG/5ML PO LIQD
15.0000 mL | Freq: Once | ORAL | Status: AC
  Administered 2023-08-15: 15 mL via ORAL
  Filled 2023-08-15: qty 15

## 2023-08-15 MED ORDER — GUAIFENESIN 100 MG/5ML PO LIQD
10.0000 mL | ORAL | Status: DC | PRN
  Administered 2023-08-15 – 2023-08-17 (×4): 10 mL via ORAL
  Filled 2023-08-15 (×4): qty 10

## 2023-08-15 NOTE — ED Notes (Signed)
ED TO INPATIENT HANDOFF REPORT  ED Nurse Name and Phone #: [phone]  S Name/Age/Gender Casey [name] 62 y.o. female Room/Bed: APA19/APA19  Code Status   Code Status: Full Code  Home/SNF/Other Home Patient oriented to: self, place, time, and situation Is this baseline? Yes   Triage Complete: Triage complete  Chief Complaint COPD with acute exacerbation (HCC) [J44.1]  Triage Note Pt arrived [facility] for SOB, productive cough. Pt usually uses 2 lpm and now needs 4 lpm to be 90%.    Allergies No Known Allergies  Level of Care/Admitting Diagnosis ED Disposition     ED Disposition  Admit   Condition  --   Comment  Hospital Area: [facility] [[id number]]  Level of Care: Telemetry [5]  Covid Evaluation: Asymptomatic - no recent exposure (last 10 days) testing not required  Diagnosis: COPD with acute exacerbation [facility]) [119147]  Admitting Physician: Marylyn Ishihara  Attending Physician: Marylyn Ishihara  Certification:: I certify this patient will need inpatient services for at least 2 midnights          B Medical/Surgery History Past Medical History:  Diagnosis Date   Anxiety    Chronic back pain    COPD (chronic obstructive pulmonary disease) (HCC)    Depression    History of panic attacks 09/11/2014   Medical history non-contributory    Pneumonia    Polysubstance abuse (HCC)    History of taking non-prescribed opiates, BZDs; also +cocaine and THC   Ruptured disc, thoracic    Suicide attempt (HCC) 2002   Past Surgical History:  Procedure Laterality Date   CESAREAN SECTION     CLAVICLE SURGERY     REVERSE SHOULDER ARTHROPLASTY Left 01/01/2020   Procedure: LEFT REVERSE SHOULDER ARTHROPLASTY;  Surgeon: Cammy Copa, MD;  Location: [facility] OR;  Service: Orthopedics;  Laterality: Left;   TUBAL LIGATION       A IV Location/Drains/Wounds Patient Lines/Drains/Airways Status     Active Line/Drains/Airways     Name Placement  date Placement time Site Days   Peripheral IV 08/14/23 20 G Left Antecubital 08/14/23  1149  Antecubital  1            Intake/Output Last 24 hours  Intake/Output Summary (Last 24 hours) at 08/15/2023 0951 Last data filed at 08/15/2023 0951 Gross per 24 hour  Intake 58.39 ml  Output --  Net 58.39 ml    Labs/Imaging Results for orders placed or performed during the hospital encounter of 08/14/23 (from the past 48 hour(s))  CBC with Differential     Status: Abnormal   Collection Time: 08/14/23 12:33 PM  Result Value Ref Range   WBC 9.1 4.0 - 10.5 K/uL   RBC 4.11 3.87 - 5.11 MIL/uL   Hemoglobin 11.8 (L) 12.0 - 15.0 g/dL   HCT 82.9 (L) 56.2 - 13.0 %   MCV 86.6 80.0 - 100.0 fL   MCH 28.7 26.0 - 34.0 pg   MCHC 33.1 30.0 - 36.0 g/dL   RDW 86.5 (H) 78.4 - 69.6 %   Platelets 324 150 - 400 K/uL   nRBC 0.0 0.0 - 0.2 %   Neutrophils Relative % 83 %   Neutro Abs 7.5 1.7 - 7.7 K/uL   Lymphocytes Relative 13 %   Lymphs Abs 1.2 0.7 - 4.0 K/uL   Monocytes Relative 3 %   Monocytes Absolute 0.3 0.1 - 1.0 K/uL   Eosinophils Relative 1 %   Eosinophils Absolute 0.1 0.0 - 0.5 K/uL  Basophils Relative 0 %   Basophils Absolute 0.0 0.0 - 0.1 K/uL   Immature Granulocytes 0 %   Abs Immature Granulocytes 0.03 0.00 - 0.07 K/uL    Comment: Performed at [facility], [address], [place], [place] [address]  Comprehensive metabolic panel     Status: Abnormal   Collection Time: 08/14/23 12:33 PM  Result Value Ref Range   Sodium 138 135 - 145 mmol/L   Potassium 3.1 (L) 3.5 - 5.1 mmol/L   Chloride 99 98 - 111 mmol/L   CO2 29 22 - 32 mmol/L   Glucose, Bld 153 (H) 70 - 99 mg/dL    Comment: Glucose reference range applies only to samples taken after fasting for at least 8 hours.   BUN 6 (L) 8 - 23 mg/dL   Creatinine, Ser 6.04 0.44 - 1.00 mg/dL   Calcium 8.5 (L) 8.9 - 10.3 mg/dL   Total Protein 6.7 6.5 - 8.1 g/dL   Albumin 3.3 (L) 3.5 - 5.0 g/dL   AST 12 (L) 15 - 41 U/L   ALT 27 0 - 44 U/L    Alkaline Phosphatase 86 38 - 126 U/L   Total Bilirubin 0.5 0.3 - 1.2 mg/dL   GFR, Estimated >54 >09 mL/min    Comment: (NOTE) Calculated using the CKD-EPI Creatinine Equation (2021)    Anion gap 10 5 - 15    Comment: Performed at [facility], [address], [place], [place] [address]  Rapid urine drug screen (hospital performed)     Status: None   Collection Time: 08/14/23  4:20 PM  Result Value Ref Range   Opiates NONE DETECTED NONE DETECTED   Cocaine NONE DETECTED NONE DETECTED   Benzodiazepines NONE DETECTED NONE DETECTED   Amphetamines NONE DETECTED NONE DETECTED   Tetrahydrocannabinol NONE DETECTED NONE DETECTED   Barbiturates NONE DETECTED NONE DETECTED    Comment: (NOTE) DRUG SCREEN FOR MEDICAL PURPOSES ONLY.  IF CONFIRMATION IS NEEDED FOR ANY PURPOSE, NOTIFY LAB WITHIN 5 DAYS.  LOWEST DETECTABLE LIMITS FOR URINE DRUG SCREEN Drug Class                     Cutoff (ng/mL) Amphetamine and metabolites    1000 Barbiturate and metabolites    200 Benzodiazepine                 200 Opiates and metabolites        300 Cocaine and metabolites        300 THC                            50 Performed at [facility], [address] [place], [place] [address]   Urinalysis, Routine w reflex microscopic -Urine, Clean Catch     Status: Abnormal   Collection Time: 08/14/23  4:20 PM  Result Value Ref Range   Color, Urine STRAW (A) YELLOW   APPearance CLEAR CLEAR   Specific Gravity, Urine 1.004 (L) 1.005 - 1.030   pH 7.0 5.0 - 8.0   Glucose, UA 150 (A) NEGATIVE mg/dL   Hgb urine dipstick NEGATIVE NEGATIVE   Bilirubin Urine NEGATIVE NEGATIVE   Ketones, ur 5 (A) NEGATIVE mg/dL   Protein, ur NEGATIVE NEGATIVE mg/dL   Nitrite NEGATIVE NEGATIVE   Leukocytes,Ua NEGATIVE NEGATIVE    Comment: Performed at [facility], [address] [place], [place] [address]  CBG monitoring, ED     Status: Abnormal  Collection Time: 08/14/23  7:29 PM  Result Value Ref Range   Glucose-Capillary 239  (H) 70 - 99 mg/dL    Comment: Glucose reference range applies only to samples taken after fasting for at least 8 hours.  CBG monitoring, ED     Status: Abnormal   Collection Time: 08/15/23  3:39 AM  Result Value Ref Range   Glucose-Capillary 207 (H) 70 - 99 mg/dL    Comment: Glucose reference range applies only to samples taken after fasting for at least 8 hours.  Basic metabolic panel     Status: Abnormal   Collection Time: 08/15/23  5:15 AM  Result Value Ref Range   Sodium 139 135 - 145 mmol/L   Potassium 4.4 3.5 - 5.1 mmol/L   Chloride 102 98 - 111 mmol/L   CO2 28 22 - 32 mmol/L   Glucose, Bld 183 (H) 70 - 99 mg/dL    Comment: Glucose reference range applies only to samples taken after fasting for at least 8 hours.   BUN 11 8 - 23 mg/dL   Creatinine, Ser 4.69 0.44 - 1.00 mg/dL   Calcium 8.7 (L) 8.9 - 10.3 mg/dL   GFR, Estimated >62 >95 mL/min    Comment: (NOTE) Calculated using the CKD-EPI Creatinine Equation (2021)    Anion gap 9 5 - 15    Comment: Performed at [facility], [address], [place], [place] [address]  CBG monitoring, ED     Status: Abnormal   Collection Time: 08/15/23  6:25 AM  Result Value Ref Range   Glucose-Capillary 155 (H) 70 - 99 mg/dL    Comment: Glucose reference range applies only to samples taken after fasting for at least 8 hours.   DG Chest 2 View  Result Date: 08/14/2023 CLINICAL DATA:  Shortness of breath EXAM: CHEST - 2 VIEW COMPARISON:  X-ray 07/26/2023 FINDINGS: No pneumothorax or effusion. Normal cardiopericardial silhouette. Diffuse interstitial changes are again seen and likely chronic. No new consolidation. Left shoulder reverse arthroplasty. IMPRESSION: Interstitial changes again seen, possibly chronic. Electronically Signed   By: Karen Kays M.D.   On: 08/14/2023 13:13    Pending Labs Unresulted Labs (From admission, onward)    None       Vitals/Pain Today's Vitals   08/15/23 0713 08/15/23 0906 08/15/23 0913 08/15/23 0915  BP:       Pulse:      Resp:      Temp:      TempSrc:      SpO2:   90% 95%  Weight:      Height:      PainSc: Asleep 0-No pain      Isolation Precautions No active isolations  Medications Medications  methylPREDNISolone sodium succinate (SOLU-MEDROL) 40 mg/mL injection 40 mg (40 mg Intravenous Given 08/15/23 0947)  dextromethorphan-guaiFENesin (MUCINEX DM) 30-600 MG per 12 hr tablet 1 tablet (1 tablet Oral Given 08/15/23 0949)  doxycycline (VIBRA-TABS) tablet 100 mg (100 mg Oral Given 08/15/23 0949)  citalopram (CELEXA) tablet 20 mg (20 mg Oral Given 08/15/23 0949)  hydrOXYzine (ATARAX) tablet 50 mg (has no administration in time range)  clonazePAM (KLONOPIN) tablet 0.5 mg (0.5 mg Oral Given 08/15/23 0949)  fluticasone furoate-vilanterol (BREO ELLIPTA) 100-25 MCG/ACT 1 puff (1 puff Inhalation Given 08/15/23 0915)  sodium chloride flush (NS) 0.9 % injection 3 mL (3 mLs Intravenous Given 08/15/23 0950)  sodium chloride flush (NS) 0.9 % injection 3 mL (3 mLs Intravenous Given 08/15/23 0951)  sodium chloride flush (NS) 0.9 %  injection 3 mL (has no administration in time range)  0.9 %  sodium chloride infusion (has no administration in time range)  acetaminophen (TYLENOL) tablet 650 mg (650 mg Oral Given 08/14/23 2331)    Or  acetaminophen (TYLENOL) suppository 650 mg ( Rectal See Alternative 08/14/23 2331)  traZODone (DESYREL) tablet 50 mg (50 mg Oral Given 08/14/23 2204)  polyethylene glycol (MIRALAX / GLYCOLAX) packet 17 g (has no administration in time range)  bisacodyl (DULCOLAX) suppository 10 mg (has no administration in time range)  ondansetron (ZOFRAN) tablet 4 mg (has no administration in time range)    Or  ondansetron (ZOFRAN) injection 4 mg (has no administration in time range)  heparin injection 5,000 Units (5,000 Units Subcutaneous Given 08/15/23 0859)  ipratropium-albuterol (DUONEB) 0.5-2.5 (3) MG/3ML nebulizer solution 3 mL (3 mLs Nebulization Given 08/15/23 0913)  albuterol (PROVENTIL)  (2.5 MG/3ML) 0.083% nebulizer solution 2.5 mg (2.5 mg Nebulization Given 08/15/23 0047)  nicotine (NICODERM CQ - dosed in mg/24 hours) patch 21 mg (21 mg Transdermal Patch Applied 08/15/23 0951)  guaiFENesin (ROBITUSSIN) 100 MG/5ML liquid 10 mL (has no administration in time range)  oxyCODONE-acetaminophen (PERCOCET/ROXICET) 5-325 MG per tablet 1 tablet (1 tablet Oral Given 08/14/23 1234)  ipratropium-albuterol (DUONEB) 0.5-2.5 (3) MG/3ML nebulizer solution 3 mL (3 mLs Nebulization Given 08/14/23 1240)  albuterol (PROVENTIL) (2.5 MG/3ML) 0.083% nebulizer solution 2.5 mg (2.5 mg Nebulization Given 08/14/23 1240)  methylPREDNISolone sodium succinate (SOLU-MEDROL) 125 mg/2 mL injection 125 mg (125 mg Intravenous Given 08/14/23 1234)  magnesium sulfate IVPB 2 g 50 mL (0 g Intravenous Stopped 08/14/23 1340)  potassium chloride SA (KLOR-CON M) CR tablet 40 mEq (40 mEq Oral Given 08/14/23 1425)  clonazePAM (KLONOPIN) tablet 0.5 mg (0.5 mg Oral Given 08/14/23 1425)  ipratropium-albuterol (DUONEB) 0.5-2.5 (3) MG/3ML nebulizer solution 3 mL (3 mLs Nebulization Given 08/14/23 1447)  albuterol (PROVENTIL) (2.5 MG/3ML) 0.083% nebulizer solution 2.5 mg (2.5 mg Nebulization Given 08/14/23 1447)  potassium chloride SA (KLOR-CON M) CR tablet 40 mEq (40 mEq Oral Given 08/14/23 2203)  LORazepam (ATIVAN) injection 0.5 mg (0.5 mg Intravenous Given 08/14/23 1527)  guaiFENesin (ROBITUSSIN) 100 MG/5ML liquid 15 mL (15 mLs Oral Given 08/15/23 0859)    Mobility walks with person assist     Focused Assessments Pulmonary Assessment Handoff:  Lung sounds: Bilateral Breath Sounds: Diminished, Expiratory wheezes L Breath Sounds: Diminished R Breath Sounds: Diminished O2 Device:  (nebulizer) O2 Flow Rate (L/min): 5 L/min    R Recommendations: See Admitting Provider Note  Report given to:   Additional Notes: pt is very anxious (given clonidine 0.5 2 times daily prn), pt needs reminded to breath through her nose not her  mouth

## 2023-08-15 NOTE — Progress Notes (Signed)
PROGRESS NOTE     Casey [name], is a 62 y.o. female, DOB - 01-09-1961, XBM:[id number]  Admit date - 08/14/2023   Admitting Physician Casey Mchan Mariea Clonts, MD  Outpatient Primary MD for the patient is Patient, No Pcp Per  LOS - 1  Chief Complaint  Patient presents with   Shortness of Breath   Brief Narrative:  62 y.o. female with Pmhx relevant for ongoing tobacco abuse, COPD, hypoxic respiratory failure supposed to be on 2 L of oxygen at home, history of polysubstance abuse including benzos opioids cocaine and THC (UDS is actually negative today) who was recently treated at this hospital for acute hypoxic respiratory failure/COPD exacerbation secondary to pneumonia from 07/26/2023 through 07/31/2023 she returns today with worsening hypoxia -She is supposed to be using 2 L of oxygen via nasal cannula appears not to be compliant due to tobacco use.  Readmitted on 08/14/2023 with acute on chronic hypoxic respiratory failure in the setting of acute COPD exacerbation with ongoing tobacco use  -Assessment and Plan: 1)Acute COPD Exacerbation---- no definite pneumonia,  -Continue IV Solu-Medrol , C/n Mucolytics, doxycycline and bronchodilators as ordered, supplemental oxygen as ordered.  - discharged on 07/31/2023 after treatment for pneumonia -Incentive spirometry advised   2)Acute on chronic respiratory failure with hypoxia  - -Due to #1 above -Discharged home on 07/31/2023 on 2 L of oxygen at home, patient admits to noncompliance due to the need to smoke. --- Required up to 5 L of oxygen on admission this time -Currently weaned down to 4 L per nasal cannula   3)Hypokalemia--- resolved with replacement   4) chronic normocytic anemia  - -Hgb at baseline -No obvious bleeding concerns at this time -Transfuse as clinically indicated   5)Tobacco Abuse--smoking cessation advised especially while on O2 Smoking cessation counseling for 4 minutes today, consider nicotine patch I have discussed  tobacco cessation with the patient.  I have counseled the patient regarding the negative impacts of continued tobacco use including but not limited to lung cancer, COPD, and cardiovascular disease.  I have discussed alternatives to tobacco and modalities that may help facilitate tobacco cessation including but not limited to biofeedback, hypnosis, and medications.  Total time spent with tobacco counseling was 4 minutes.   6)Hyperglycemia--recent A1c 6.0 -Anticipate worsening hyperglycemia with steroids Use Novolog/Humalog Sliding scale insulin with Accu-Cheks/Fingersticks as ordered    7)Protein caloric malnutrition--- nutritional supplements advised -Smoking cessation will help to improve her appetite  Status is: Inpatient   Disposition: The patient is from: Home              Anticipated d/c is to: Home              Anticipated d/c date is: 2 days              Patient currently is not medically stable to d/c. Barriers: Not Clinically Stable-   Code Status :  -  Code Status: Full Code   Family Communication:    NA (patient is alert, awake and coherent)   DVT Prophylaxis  :   - SCDs  heparin injection 5,000 Units Start: 08/14/23 2200 SCDs Start: 08/14/23 1451 Place TED hose Start: 08/14/23 1451   Lab Results  Component Value Date   PLT 324 08/14/2023    Inpatient Medications  Scheduled Meds:  citalopram  20 mg Oral Daily   dextromethorphan-guaiFENesin  1 tablet Oral BID   doxycycline  100 mg Oral Q12H   fluticasone furoate-vilanterol  1 puff Inhalation Daily  heparin  5,000 Units Subcutaneous Q8H   ipratropium-albuterol  3 mL Nebulization Q6H   methylPREDNISolone (SOLU-MEDROL) injection  40 mg Intravenous Q12H   nicotine  21 mg Transdermal Daily   sodium chloride flush  3 mL Intravenous Q12H   sodium chloride flush  3 mL Intravenous Q12H   Continuous Infusions:  sodium chloride     PRN Meds:.sodium chloride, acetaminophen **OR** acetaminophen, albuterol, bisacodyl,  clonazePAM, guaiFENesin, hydrOXYzine, ondansetron **OR** ondansetron (ZOFRAN) IV, polyethylene glycol, sodium chloride flush, traZODone   Anti-infectives (From admission, onward)    Start     Dose/Rate Route Frequency Ordered Stop   08/14/23 1600  doxycycline (VIBRA-TABS) tablet 100 mg        100 mg Oral Every 12 hours 08/14/23 1442           Subjective: Casey [name] today has no fevers, no emesis,  No chest pain,   Cough and dyspnea persist -Hypoxia persist -O2 sats drops even more when patient becomes anxious and becomes tachypneic  Objective: Vitals:   08/15/23 0600 08/15/23 0700 08/15/23 0913 08/15/23 0915  BP: (!) 89/58 111/83    Pulse: (!) 102 100    Resp: (!) 36 (!) 27    Temp:  98 F (36.7 C)    TempSrc:      SpO2: (!) 89% 99% 90% 95%  Weight:      Height:        Intake/Output Summary (Last 24 hours) at 08/15/2023 0935 Last data filed at 08/14/2023 2213 Gross per 24 hour  Intake 52.39 ml  Output --  Net 52.39 ml   Filed Weights   08/14/23 1151  Weight: 56.7 kg   Physical Exam  Gen:- Awake Alert,  some conversational dyspnea HEENT:- [place].AT, No sclera icterus Nose-[place] 4L/min Neck-Supple Neck,No JVD,.  Lungs-somewhat diminished breath sounds, scattered wheezes noted CV- S1, S2 normal, regular , improving tachycardia Abd-  +ve B.Sounds, Abd Soft, No tenderness,    Extremity/Skin:- No  edema, pedal pulses present  Psych-affect is anxious, oriented x3 Neuro-no new focal deficits, no tremors  Data Reviewed: I have personally reviewed following labs and imaging studies  CBC: Recent Labs  Lab 08/14/23 1233  WBC 9.1  NEUTROABS 7.5  HGB 11.8*  HCT 35.6*  MCV 86.6  PLT 324   Basic Metabolic Panel: Recent Labs  Lab 08/14/23 1233 08/15/23 0515  NA 138 139  K 3.1* 4.4  CL 99 102  CO2 29 28  GLUCOSE 153* 183*  BUN 6* 11  CREATININE 0.46 0.50  CALCIUM 8.5* 8.7*   GFR: Estimated Creatinine Clearance: 63.6 mL/min (by C-G formula based on SCr of  0.5 mg/dL). Liver Function Tests: Recent Labs  Lab 08/14/23 1233  AST 12*  ALT 27  ALKPHOS 86  BILITOT 0.5  PROT 6.7  ALBUMIN 3.3*   Radiology Studies: DG Chest 2 View  Result Date: 08/14/2023 CLINICAL DATA:  Shortness of breath EXAM: CHEST - 2 VIEW COMPARISON:  X-ray 07/26/2023 FINDINGS: No pneumothorax or effusion. Normal cardiopericardial silhouette. Diffuse interstitial changes are again seen and likely chronic. No new consolidation. Left shoulder reverse arthroplasty. IMPRESSION: Interstitial changes again seen, possibly chronic. Electronically Signed   By: Karen Kays M.D.   On: 08/14/2023 13:13    Scheduled Meds:  citalopram  20 mg Oral Daily   dextromethorphan-guaiFENesin  1 tablet Oral BID   doxycycline  100 mg Oral Q12H   fluticasone furoate-vilanterol  1 puff Inhalation Daily   heparin  5,000 Units Subcutaneous Q8H  ipratropium-albuterol  3 mL Nebulization Q6H   methylPREDNISolone (SOLU-MEDROL) injection  40 mg Intravenous Q12H   nicotine  21 mg Transdermal Daily   sodium chloride flush  3 mL Intravenous Q12H   sodium chloride flush  3 mL Intravenous Q12H   Continuous Infusions:  sodium chloride      LOS: 1 day   Shon Hale M.D on 08/15/2023 at 9:35 AM  Go to [email or web address] - for contact info  [facility] - Office  [phone]  If 7PM-7AM, please contact night-coverage [email or web address] 08/15/2023, 9:35 AM

## 2023-08-15 NOTE — Progress Notes (Signed)
Pt resting in bed, A&O, continues to complain of pain in back and shoulders, asking staff to call MD for pain med. Reminded pt that MD Courage is aware of her request and has already told her that he will not be ordering any additional pain med other than tylenol. Pt states understanding. Resp rate 26/min, breath sounds with insp & exp wheezes, dry cough noted. O2 remains at 5lpm [place]. Cont abd breathing noted. Tolerating food and oral fluid without diff or n/v.

## 2023-08-15 NOTE — Progress Notes (Signed)
MD Courage on unit and aware of current pt condition and resp rate. States no new orders at this time, to give tylenol for c/o pain and continue to monitor pt condition. States resp rate is not new, rate will increase when pt is upset and anxious. Pt notified that MD Courage states tylenol for pain and no additional meds for anxiety at this time, pt states understanding.

## 2023-08-15 NOTE — Progress Notes (Signed)
  Tobacco Abuse--smoking cessation advised especially while on O2 Smoking cessation counseling for 4 minutes today, consider nicotine patch I have discussed tobacco cessation with the patient.  I have counseled the patient regarding the negative impacts of continued tobacco use including but not limited to lung cancer, COPD, and cardiovascular disease.  I have discussed alternatives to tobacco and modalities that may help facilitate tobacco cessation including but not limited to biofeedback, hypnosis, and medications.  Total time spent with tobacco counseling was 4 minutes.

## 2023-08-15 NOTE — Progress Notes (Signed)
   08/15/23 1100  Vitals  Temp 97.9 F (36.6 C)  Temp Source Oral  BP (!) 145/78  MAP (mmHg) 99  BP Location Right Arm  BP Method Automatic  Patient Position (if appropriate) Lying  Pulse Rate (!) 113  Pulse Rate Source Dinamap  Resp (!) 48  Level of Consciousness  Level of Consciousness Alert  MEWS COLOR  MEWS Score Color Red  Oxygen Therapy  SpO2 96 %  O2 Device Nasal Cannula  O2 Flow Rate (L/min) 6 L/min  Pain Assessment  Pain Scale 0-10  Pain Score 8  Pain Type Chronic pain  Pain Location Back  Pain Orientation Upper  Patients Stated Pain Goal 2  MEWS Score  MEWS Temp 0  MEWS Systolic 0  MEWS Pulse 2  MEWS RR 3  MEWS LOC 0  MEWS Score 5   Pt arrived from ED as new admission. Pt states unable to stand or walk due to extreme SOB. Pt moved from stretcher to bed by staff x2. Pt extremely SOB with RR as above. Abd breathing with retraction noted. SaO2 96% on 5 lpm . Breath sounds with insp and exp wheezes bilaterally upper lobes, diminished bases. Congested, non-prod cough noted. Cap refill <3 sec. Color appropriate, no cyanosis noted. Pt requesting both pain medication for chronic back pain and extra klonopin for her nerves. MD Courage notified of current pt condition and pt request. Charge nurse Denman George, RN notified of pt condition.

## 2023-08-15 NOTE — Progress Notes (Signed)
Pt's hair washed by NT. Pt c/o increasing anxiety, pt given ordered medication (see MAR). Resp rate 32/min at this time, no resp distress noted, Skin warm and dry, color appropriate. Occasional dry cough. Tolerating oral fluids without difficulty.

## 2023-08-16 DIAGNOSIS — J441 Chronic obstructive pulmonary disease with (acute) exacerbation: Secondary | ICD-10-CM | POA: Diagnosis not present

## 2023-08-16 LAB — GLUCOSE, CAPILLARY
Glucose-Capillary: 143 mg/dL — ABNORMAL HIGH (ref 70–99)
Glucose-Capillary: 163 mg/dL — ABNORMAL HIGH (ref 70–99)

## 2023-08-16 MED ORDER — ENSURE ENLIVE PO LIQD
237.0000 mL | Freq: Three times a day (TID) | ORAL | Status: DC
  Administered 2023-08-16 – 2023-08-17 (×3): 237 mL via ORAL

## 2023-08-16 MED ORDER — KETOROLAC TROMETHAMINE 15 MG/ML IJ SOLN
15.0000 mg | Freq: Once | INTRAMUSCULAR | Status: AC
  Administered 2023-08-16: 15 mg via INTRAVENOUS
  Filled 2023-08-16: qty 1

## 2023-08-16 MED ORDER — ADULT MULTIVITAMIN W/MINERALS CH
1.0000 | ORAL_TABLET | Freq: Every day | ORAL | Status: DC
  Administered 2023-08-16 – 2023-08-17 (×2): 1 via ORAL
  Filled 2023-08-16 (×2): qty 1

## 2023-08-16 NOTE — Plan of Care (Signed)
  Problem: Acute Rehab PT Goals(only PT should resolve) Goal: Pt Will Go Supine/Side To Sit Outcome: Progressing Flowsheets (Taken 08/16/2023 1459) Pt will go Supine/Side to Sit: Independently Goal: Patient Will Transfer Sit To/From Stand Outcome: Progressing Flowsheets (Taken 08/16/2023 1459) Patient will transfer sit to/from stand: Independently Goal: Pt Will Transfer Bed To Chair/Chair To Bed Outcome: Progressing Flowsheets (Taken 08/16/2023 1459) Pt will Transfer Bed to Chair/Chair to Bed: Independently Goal: Pt Will Ambulate Outcome: Progressing Flowsheets (Taken 08/16/2023 1459) Pt will Ambulate:  75 feet  with least restrictive assistive device  with modified independence   2:59 PM, 08/16/23 Ocie Bob, MPT Physical Therapist with [facility] 336 [phone] office [phone] mobile phone

## 2023-08-16 NOTE — Progress Notes (Signed)
PROGRESS NOTE     Casey [name], is a 62 y.o. female, DOB - June 08, 1961, WUJ:[id number]  Admit date - 08/14/2023   Admitting Physician Casey Krantz Casey Clonts, MD  Outpatient Primary MD for the patient is Casey [name]  LOS - 2  Chief Complaint  Patient presents with   Shortness of Breath   Brief Narrative:  62 y.o. female with Pmhx relevant for ongoing tobacco abuse, COPD, hypoxic respiratory failure supposed to be on 2 L of oxygen at home, history of polysubstance abuse including benzos opioids cocaine and THC (UDS is actually negative today) who was recently treated at this hospital for acute hypoxic respiratory failure/COPD exacerbation secondary to pneumonia from 07/26/2023 through 07/31/2023 she returns today with worsening hypoxia -She is supposed to be using 2 L of oxygen via nasal cannula appears not to be compliant due to tobacco use.  Readmitted on 08/14/2023 with acute on chronic hypoxic respiratory failure in the setting of acute COPD exacerbation with ongoing tobacco use  -Assessment and Plan: 1)Acute COPD Exacerbation---- no definite pneumonia,  -Continue IV Solu-Medrol , C/n Mucolytics, doxycycline and bronchodilators as ordered, supplemental oxygen as ordered.  -Recently discharged on 07/31/2023 after treatment for pneumonia -08/16/23 -Dyspnea cough and hypoxia persist -Incentive spirometry advised   2)Acute on chronic respiratory failure with hypoxia  - -Due to #1 above -Discharged home on 07/31/2023 on 2 L of oxygen at home, patient admits to noncompliance due to the need to smoke. --- Required up to 5 L of oxygen on admission this time -Currently weaned down to 4 L [name] nasal cannula -Patient becomes anxious with attempts to wean down her oxygen follow   3)Hypokalemia--- resolved with replacement   4) chronic normocytic anemia  - -Hgb at baseline -No obvious bleeding concerns at this time -Transfuse as clinically indicated   5)Tobacco Abuse--continue nicotine  patch  6)Hyperglycemia--recent A1c 6.0 -Anticipate worsening hyperglycemia with steroids Use Novolog/Humalog Sliding scale insulin with Accu-Cheks/Fingersticks as ordered    7)Protein caloric malnutrition--- nutritional supplements advised -Smoking cessation will help to improve her appetite  8) generalized weakness--- physical therapy evaluation appreciated recommends home health PT  9) social/ethics--patient is very anxious about going home Apparently her 51 year old son who she lives with will be going to jail tomorrow 08/17/2023 for 6 months for drug offenses -Patient admits to history of polysubstance abuse, both patient and son use street/illicit drugs  Status is: Inpatient   Disposition: The patient is from: Home              Anticipated d/c is to: Home              Anticipated d/c date is: 1 day              Patient currently is not medically stable to d/c. Barriers: Not Clinically Stable-   Code Status :  -  Code Status: Full Code   Family Communication:    NA (patient is alert, awake and coherent)   DVT Prophylaxis  :   - SCDs  heparin injection 5,000 Units Start: 08/14/23 2200 SCDs Start: 08/14/23 1451 Place TED hose Start: 08/14/23 1451   Lab Results  Component Value Date   PLT 324 08/14/2023    Inpatient Medications  Scheduled Meds:  citalopram  20 mg Oral Daily   dextromethorphan-guaiFENesin  1 tablet Oral BID   doxycycline  100 mg Oral Q12H   feeding supplement  237 mL Oral TID BM   fluticasone furoate-vilanterol  1 puff Inhalation Daily  heparin  5,000 Units Subcutaneous Q8H   ipratropium-albuterol  3 mL Nebulization Q6H   methylPREDNISolone (SOLU-MEDROL) injection  40 mg Intravenous Q12H   multivitamin with minerals  1 tablet Oral Daily   nicotine  21 mg Transdermal Daily   sodium chloride flush  3 mL Intravenous Q12H   sodium chloride flush  3 mL Intravenous Q12H   Continuous Infusions:  sodium chloride     PRN Meds:.sodium chloride,  acetaminophen **OR** acetaminophen, albuterol, bisacodyl, clonazePAM, guaiFENesin, hydrOXYzine, ondansetron **OR** ondansetron (ZOFRAN) IV, polyethylene glycol, sodium chloride flush, traZODone   Anti-infectives (From admission, onward)    Start     Dose/Rate Route Frequency Ordered Stop   08/14/23 1600  doxycycline (VIBRA-TABS) tablet 100 mg        100 mg Oral Every 12 hours 08/14/23 1442           Subjective: Casey [name] today has no fevers, no emesis,  No chest pain,   -Patient becomes very anxious with attempt to wean down her oxygen -Cough dyspnea and hypoxia is not worse -Oral intake is fair  Objective: Vitals:   08/16/23 1324 08/16/23 1417 08/16/23 1640 08/16/23 1754  BP:  122/70 126/73 108/66  Pulse:  (!) 109 99 95  Resp:  (!) 24 19 (!) 26  Temp:  97.7 F (36.5 C) 98 F (36.7 C)   TempSrc:   Oral   SpO2: 98% 96% 98% 99%  Weight:      Height:        Intake/Output Summary (Last 24 hours) at 08/16/2023 1928 Last data filed at 08/16/2023 1700 [address] [name] 24 hour  Intake 720 ml  Output 501 ml  Net 219 ml   Filed Weights   08/14/23 1151  Weight: 56.7 kg   Physical Exam  Gen:- Awake Alert,  some conversational dyspnea HEENT:- Casey [name].AT, No sclera icterus Nose-[place] 4L/min Neck-Supple Neck,No JVD,.  Lungs-somewhat diminished breath sounds, scattered wheezes noted CV- S1, S2 normal, regular , improving tachycardia Abd-  +ve B.Sounds, Abd Soft, No tenderness,    Extremity/Skin:- No  edema, pedal pulses present  Psych-affect is anxious, oriented x3 Neuro-no new focal deficits, no tremors  Data Reviewed: I have personally reviewed following labs and imaging studies  CBC: Recent Labs  Lab 08/14/23 1233  WBC 9.1  NEUTROABS 7.5  HGB 11.8*  HCT 35.6*  MCV 86.6  PLT 324   Basic Metabolic Panel: Recent Labs  Lab 08/14/23 1233 08/15/23 0515  NA 138 139  K 3.1* 4.4  CL 99 102  CO2 29 28  GLUCOSE 153* 183*  BUN 6* 11  CREATININE 0.46 0.50  CALCIUM 8.5* 8.7*    GFR: Estimated Creatinine Clearance: 63.6 mL/min (by C-G formula based on SCr of 0.5 mg/dL). Liver Function Tests: Recent Labs  Lab 08/14/23 1233  AST 12*  ALT 27  ALKPHOS 86  BILITOT 0.5  PROT 6.7  ALBUMIN 3.3*   Radiology Studies: No results found.  Scheduled Meds:  citalopram  20 mg Oral Daily   dextromethorphan-guaiFENesin  1 tablet Oral BID   doxycycline  100 mg Oral Q12H   feeding supplement  237 mL Oral TID BM   fluticasone furoate-vilanterol  1 puff Inhalation Daily   heparin  5,000 Units Subcutaneous Q8H   ipratropium-albuterol  3 mL Nebulization Q6H   methylPREDNISolone (SOLU-MEDROL) injection  40 mg Intravenous Q12H   multivitamin with minerals  1 tablet Oral Daily   nicotine  21 mg Transdermal Daily   sodium chloride flush  3 mL Intravenous Q12H   sodium chloride flush  3 mL Intravenous Q12H   Continuous Infusions:  sodium chloride      LOS: 2 days   Shon Hale M.D on 08/16/2023 at 7:28 PM  Go to [email or web address] - for contact info  [facility] - Office  [phone]  If 7PM-7AM, please contact night-coverage [email or web address] 08/16/2023, 7:28 PM

## 2023-08-16 NOTE — Progress Notes (Signed)
Mobility Specialist Progress Note:    08/16/23 1030  Mobility  Activity Ambulated with assistance in room  Level of Assistance Contact guard assist, steadying assist  Assistive Device None  Distance Ambulated (ft) 15 ft  Range of Motion/Exercises Active;All extremities  Activity Response Tolerated well  Mobility Referral Yes  $Mobility charge 1 Mobility  Mobility Specialist Start Time (ACUTE ONLY) 1030  Mobility Specialist Stop Time (ACUTE ONLY) 1045  Mobility Specialist Time Calculation (min) (ACUTE ONLY) 15 min   Pt received in bed, agreeable to mobility. Required CGA to stand and ambulate with no AD. Tolerated well, baseline SpO2 95% on 4L at rest. SpO2 88% on 4L during ambulation. Pt had anxiety, returned pt sitting EOB. Slowly recovered, SpO2 94% on 4L. Left pt supine, bed alarm on. All needs met.   Lawerance Bach Mobility Specialist Please contact via Special educational needs teacher or  Rehab office at [phone]

## 2023-08-16 NOTE — Evaluation (Signed)
Physical Therapy Evaluation Patient Details Name: Casey [name] MRN: [id number] DOB: [birth year] Today's Date: 08/16/2023  History of Present Illness  Casey [name]  is a 62 y.o. female with Pmhx relevant for ongoing tobacco abuse, COPD, hypoxic respiratory failure supposed to be on 2 L of oxygen at home, history of polysubstance abuse including benzos opioids cocaine and THC (UDS is actually negative today) who was recently treated at this hospital for acute hypoxic respiratory failure/COPD exacerbation secondary to pneumonia from 07/26/2023 through 07/31/2023 she returns today with worsening hypoxia  -She is supposed to be using 2 L of oxygen via nasal cannula appears not to be compliant due to tobacco use  -Currently in the ED requiring up to 5 L of oxygen via nasal cannula   Clinical Impression  Patient easily fatigues due SOB and limited to ambulating in room before having to sit with SpO2 dropping from 91% to 85% while on 2 LPM, put back on 4 LPM while sitting in chair with SpO2 increasing to 93% after resting.  Patient demonstrates good return for transferring to/from [facility] and tolerated staying up in chair after therapy.  Patient will benefit from continued skilled physical therapy in hospital and recommended venue below to increase strength, balance, endurance for safe ADLs and gait.         If plan is discharge home, recommend the following: A little help with walking and/or transfers;A little help with bathing/dressing/bathroom;Help with stairs or ramp for entrance;Assistance with cooking/housework   Can travel by private vehicle        Equipment Recommendations None recommended by PT  Recommendations for Other Services       Functional Status Assessment Patient has had a recent decline in their functional status and demonstrates the ability to make significant improvements in function in a reasonable and predictable amount of time.     Precautions / Restrictions  Precautions Precautions: Fall Restrictions Weight Bearing Restrictions: No      Mobility  Bed Mobility Overal bed mobility: Modified Independent                  Transfers Overall transfer level: Modified independent                      Ambulation/Gait Ambulation/Gait assistance: Supervision, Contact guard assist Gait Distance (Feet): 25 Feet Assistive device: None, 1 person hand held assist Gait Pattern/deviations: Decreased step length - right, Decreased step length - left, Decreased stride length Gait velocity: decreased     General Gait Details: slightly labored cadence with occasional stumbling without loss of balance, limited mostly due to SOB with SpO2 dropping from 91% to 85% while on 2 LPM  Stairs            Wheelchair Mobility     Tilt Bed    Modified Rankin (Stroke Patients Only)       Balance Overall balance assessment: Needs assistance Sitting-balance support: Feet supported, No upper extremity supported Sitting balance-Leahy Scale: Good Sitting balance - Comments: seated at EOB   Standing balance support: During functional activity, No upper extremity supported Standing balance-Leahy Scale: Fair Standing balance comment: without AD                             Pertinent Vitals/Pain Pain Assessment Pain Assessment: No/denies pain    Home Living Family/patient expects to be discharged to:: Private residence Living Arrangements: Children Available Help at Discharge: Family;Available PRN/intermittently  Type of Home: House Home Access: Stairs to enter   [employer] of Steps: 1   Home Layout: One level Home Equipment: Agricultural consultant (2 wheels) Additional Comments: son is disabled and `lives with pt    Prior Function Prior Level of Function : Independent/Modified Independent             Mobility Comments: household and short distanced community ambulator without AD ADLs Comments: Independent      Extremity/Trunk Assessment   Upper Extremity Assessment Upper Extremity Assessment: Overall WFL for tasks assessed    Lower Extremity Assessment Lower Extremity Assessment: Generalized weakness    Cervical / Trunk Assessment Cervical / Trunk Assessment: Normal  Communication   Communication Communication: No apparent difficulties  Cognition Arousal: Alert Behavior During Therapy: WFL for tasks assessed/performed Overall Cognitive Status: Within Functional Limits for tasks assessed                                          General Comments      Exercises     Assessment/Plan    PT Assessment Patient needs continued PT services  PT Problem List Decreased strength;Decreased activity tolerance;Decreased balance;Decreased mobility       PT Treatment Interventions DME instruction;Gait training;Stair training;Functional mobility training;Therapeutic activities;Therapeutic exercise;Balance training;Patient/family education    PT Goals (Current goals can be found in the Care Plan section)  Acute Rehab PT Goals Patient Stated Goal: return home with family to assist PT Goal Formulation: With patient Time For Goal Achievement: 08/20/23 Potential to Achieve Goals: Good    Frequency Min 2X/week     Co-evaluation               AM-PAC PT "6 Clicks" Mobility  Outcome Measure Help needed turning from your back to your side while in a flat bed without using bedrails?: None Help needed moving from lying on your back to sitting on the side of a flat bed without using bedrails?: None Help needed moving to and from a bed to a chair (including a wheelchair)?: A Little Help needed standing up from a chair using your arms (e.g., wheelchair or bedside chair)?: A Little Help needed to walk in hospital room?: A Little Help needed climbing 3-5 steps with a railing? : A Little 6 Click Score: 20    End of Session Equipment Utilized During Treatment:  Oxygen Activity Tolerance: Patient tolerated treatment well;Patient limited by fatigue Patient left: in chair;with call bell/phone within reach Nurse Communication: Mobility status PT Visit Diagnosis: Unsteadiness on feet (R26.81);Other abnormalities of gait and mobility (R26.89);Muscle weakness (generalized) (M62.81)    Time: 4098-1191 PT Time Calculation (min) (ACUTE ONLY): 24 min   Charges:   PT Evaluation $PT Eval Moderate Complexity: 1 Mod PT Treatments $Therapeutic Activity: 23-37 mins PT General Charges $$ ACUTE PT VISIT: 1 Visit         2:58 PM, 08/16/23 Ocie Bob, MPT Physical Therapist with [facility] 336 [phone] office [phone] mobile phone

## 2023-08-16 NOTE — Plan of Care (Signed)
  Problem: Health Behavior/Discharge Planning: Goal: Ability to manage health-related needs will improve 08/16/2023 0702 by Candace Gallus, RN Outcome: Progressing 08/16/2023 0702 by Candace Gallus, RN Outcome: Progressing

## 2023-08-16 NOTE — Plan of Care (Signed)
  Problem: Health Behavior/Discharge Planning: Goal: Ability to manage health-related needs will improve Outcome: Progressing   

## 2023-08-16 NOTE — TOC Initial Note (Signed)
Transition of Care [facility]) - Initial/Assessment Note   Patient Details  Name: Casey [name] MRN: [id number] Date of Birth: [birth year]  [facility] CM/SW Contact:    Villa Herb, LCSWA Phone Number: 08/16/2023, 2:18 PM  Clinical Narrative:                 Pt recently admitted and assessed. Pts son lives with her. Pt is independent in completing her ADLs and drives herself to appointments. Pt has not had HH per her son. Pt has a walker and home O2 through [employer]. TOC to follow.   Expected Discharge Plan: Home/Self Care Barriers to Discharge: Inadequate or no insurance   Patient Goals and CMS Choice Patient states their goals for this hospitalization and ongoing recovery are:: return home CMS Medicare.gov Compare Post Acute Care list provided to:: Patient Choice offered to / list presented to : Patient     Expected Discharge Plan and Services In-house Referral: Clinical Social Work Discharge Planning Services: CM Consult   Living arrangements for the past 2 months: Single Family Home  Prior Living Arrangements/Services Living arrangements for the past 2 months: Single Family Home Lives with:: Adult Children Patient language and need for interpreter reviewed:: Yes Do you feel safe going back to the place where you live?: Yes      Need for Family Participation in Patient Care: Yes (Comment) Care giver support system in place?: Yes (comment) Current home services: DME Criminal Activity/Legal Involvement Pertinent to Current Situation/Hospitalization: No - Comment as needed  Activities of Daily Living   ADL Screening (condition at time of admission) Independently performs ADLs?: No Does the patient have a NEW difficulty with bathing/dressing/toileting/self-feeding that is expected to last >3 days?: Yes (Initiates electronic notice to provider for possible OT consult) Does the patient have a NEW difficulty with getting in/out of bed, walking, or climbing stairs that  is expected to last >3 days?: Yes (Initiates electronic notice to provider for possible PT consult) Does the patient have a NEW difficulty with communication that is expected to last >3 days?: No Is the patient deaf or have difficulty hearing?: No Does the patient have difficulty seeing, even when wearing glasses/contacts?: No Does the patient have difficulty concentrating, remembering, or making decisions?: No  Permission Sought/Granted                  Emotional Assessment Appearance:: Appears stated age Attitude/Demeanor/Rapport: Engaged Affect (typically observed): Accepting Orientation: : Oriented to Self, Oriented to Place, Oriented to  Time, Oriented to Situation Alcohol / Substance Use: Not Applicable Psych Involvement: No (comment)  Admission diagnosis:  COPD exacerbation (HCC) [J44.1] COPD with acute exacerbation (HCC) [J44.1] Patient Active Problem List   Diagnosis Date Noted   Tobacco abuse 08/15/2023   Malnutrition of moderate degree 07/27/2023   Sepsis due to pneumonia (HCC) 07/26/2023   Acute and chronic respiratory failure with hypoxia (HCC) 07/26/2023   Cocaine abuse 07/26/2023   Tetrahydrocannabinol (THC) use disorder 07/26/2023   Hypokalemia 07/26/2023   Hyperglycemia 07/26/2023   Drug-seeking behavior 07/26/2023   Leukocytosis 07/26/2023   Opioid use disorder 07/17/2023   Pneumonia of right upper lobe due to infectious organism 07/08/2023   Delirium 07/07/2023   Vomiting without nausea 07/07/2023   COPD with acute exacerbation (HCC) 07/06/2023   Normocytic anemia 07/06/2023   Pneumonia of right lower lobe due to infectious organism 07/06/2023   Nausea and vomiting 07/05/2023   Severe protein-calorie malnutrition (HCC) 06/29/2023   Acute respiratory failure  with hypoxia (HCC) 06/26/2023   Polysubstance abuse (HCC) 06/26/2023   Acute metabolic encephalopathy 06/26/2023   Aspiration pneumonia (HCC) 06/25/2023   Primary insomnia 08/20/2021    Depression, recurrent (HCC) 08/20/2021   GAD (generalized anxiety disorder) 08/20/2021   Elevated blood pressure reading in office without diagnosis of hypertension 08/20/2021   Closed fracture of left proximal humerus 01/01/2020   Fracture of base of fifth metacarpal bone of right hand 10/06/2016   Benzodiazepine dependence, continuous (HCC) 01/09/2016   History of panic attacks 09/11/2014   Malaise 09/03/2014   Fatigue 09/03/2014   Body mass index (BMI) of 20.0-20.9 in adult 09/03/2014   Right clavicle fracture 12/12/2013   PCP:  Patient, No Pcp Per Pharmacy:   [id number] - [place], [place] - [address] [place] [address] Phone: [phone] Fax: [phone]     Social Determinants of Health (SDOH) Social History: SDOH Screenings   Food Insecurity: Food Insecurity Present (08/15/2023)  Housing: Low Risk  (08/15/2023)  Transportation Needs: Unmet Transportation Needs (08/15/2023)  Utilities: Not At Risk (08/15/2023)  Recent Concern: Utilities - At Risk (07/31/2023)  Depression (PHQ2-9): Medium Risk (02/24/2023)  Tobacco Use: Medium Risk (08/15/2023)   SDOH Interventions:     Readmission Risk Interventions    08/16/2023    2:16 PM 07/27/2023   10:34 AM  Readmission Risk Prevention Plan  Transportation Screening Complete Complete  HRI or Home Care Consult  Complete  Social Work Consult for Recovery Care Planning/Counseling  Complete  Palliative Care Screening  Not Applicable  Medication Review Oceanographer) Complete Complete  HRI or Home Care Consult Complete   SW Recovery Care/Counseling Consult Complete   Palliative Care Screening Not Applicable   Skilled Nursing Facility Not Applicable

## 2023-08-16 NOTE — Progress Notes (Signed)
Initial Nutrition Assessment  DOCUMENTATION CODES:   Severe malnutrition in context of chronic illness  INTERVENTION:   Chocolate Ensure Enlive po TID, each supplement provides 350 kcal and 20 grams of protein. MVI with minerals daily. Magic cup TID with meals, each supplement provides 290 kcal and 9 grams of protein.  NUTRITION DIAGNOSIS:   Severe Malnutrition related to chronic illness (COPD) as evidenced by severe muscle depletion, percent weight loss.  GOAL:   Patient will meet greater than or equal to 90% of their needs  MONITOR:   PO intake, Supplement acceptance  REASON FOR ASSESSMENT:   Malnutrition Screening Tool    ASSESSMENT:   62 yo female admitted with COPD exacerbation. PMH includes tobacco abuse, polysubstance abuse, COPD, malnutrition.  Patient endorses difficulty breathing and that she stresses about it and that makes it worse. She reports a 20 lb weight loss since August. She knows she needs to eat more and agreed to drink Ensure supplements between meals and try magic cups with meals.   Currently on a heart healthy diet, consuming 50% of meals.   Labs reviewed.  CBG: [id number]  Medications reviewed and include solumedrol.  Weight history reviewed. Patient with 13% weight loss within the past month. Patient meets criteria for severe malnutrition, given severe depletion of muscle mass and severe weight loss.  NUTRITION - FOCUSED PHYSICAL EXAM:  Flowsheet Row Most Recent Value  Orbital Region Moderate depletion  Upper Arm Region Moderate depletion  Thoracic and Lumbar Region Moderate depletion  Buccal Region Moderate depletion  Temple Region Moderate depletion  Clavicle Bone Region Severe depletion  Clavicle and Acromion Bone Region Severe depletion  Scapular Bone Region Severe depletion  Dorsal Hand Severe depletion  Patellar Region Moderate depletion  Anterior Thigh Region Moderate depletion  Posterior Calf Region Moderate depletion   Edema (RD Assessment) Mild  Hair Reviewed  Eyes Reviewed  Mouth Reviewed  Skin Reviewed  Nails Reviewed       Diet Order:   Diet Order             Diet Heart Room service appropriate? Yes; Fluid consistency: Thin  Diet effective now                   EDUCATION NEEDS:   Education needs have been addressed  Skin:  Skin Assessment: Reviewed RN Assessment  Last BM:  10/6  Height:   Ht Readings from Last 1 Encounters:  08/14/23 5\' 5"  (1.651 m)    Weight:   Wt Readings from Last 1 Encounters:  08/14/23 56.7 kg    Ideal Body Weight:  56.8 kg  BMI:  Body mass index is 20.8 kg/m.  Estimated Nutritional Needs:   Kcal:  1800-2000  Protein:  85-95 gm  Fluid:  1.8-2 L   Gabriel Rainwater RD, LDN, CNSC Please refer to Amion for contact information.

## 2023-08-17 ENCOUNTER — Other Ambulatory Visit (HOSPITAL_COMMUNITY): Payer: Self-pay

## 2023-08-17 ENCOUNTER — Telehealth (HOSPITAL_COMMUNITY): Payer: Self-pay

## 2023-08-17 DIAGNOSIS — J441 Chronic obstructive pulmonary disease with (acute) exacerbation: Secondary | ICD-10-CM | POA: Diagnosis not present

## 2023-08-17 LAB — GLUCOSE, CAPILLARY
Glucose-Capillary: 113 mg/dL — ABNORMAL HIGH (ref 70–99)
Glucose-Capillary: 141 mg/dL — ABNORMAL HIGH (ref 70–99)
Glucose-Capillary: 146 mg/dL — ABNORMAL HIGH (ref 70–99)

## 2023-08-17 MED ORDER — FLUTICASONE FUROATE-VILANTEROL 100-25 MCG/ACT IN AEPB: 1.0000 | INHALATION_SPRAY | Freq: Every day | RESPIRATORY_TRACT | 3 refills | Status: DC

## 2023-08-17 MED ORDER — HYDROXYZINE HCL 50 MG PO TABS: 50.0000 mg | ORAL_TABLET | Freq: Three times a day (TID) | ORAL | 3 refills | Status: DC | PRN

## 2023-08-17 MED ORDER — ACETAMINOPHEN 325 MG PO TABS: 650.0000 mg | ORAL_TABLET | Freq: Four times a day (QID) | ORAL | Status: AC | PRN

## 2023-08-17 MED ORDER — TRAZODONE HCL 50 MG PO TABS: 50.0000 mg | ORAL_TABLET | Freq: Every evening | ORAL | 3 refills | Status: DC | PRN

## 2023-08-17 MED ORDER — CITALOPRAM HYDROBROMIDE 20 MG PO TABS: 20.0000 mg | ORAL_TABLET | Freq: Every day | ORAL | 3 refills | Status: DC

## 2023-08-17 MED ORDER — PREDNISONE 20 MG PO TABS: 40.0000 mg | ORAL_TABLET | Freq: Every day | ORAL | 0 refills | Status: AC

## 2023-08-17 MED ORDER — DOXYCYCLINE HYCLATE 100 MG PO TABS
100.0000 mg | ORAL_TABLET | Freq: Two times a day (BID) | ORAL | 0 refills | Status: AC
Start: 2023-08-17 — End: 2023-08-22

## 2023-08-17 MED ORDER — ALBUTEROL SULFATE HFA 108 (90 BASE) MCG/ACT IN AERS: 2.0000 | INHALATION_SPRAY | Freq: Four times a day (QID) | RESPIRATORY_TRACT | 1 refills | Status: DC | PRN

## 2023-08-17 MED ORDER — ENSURE ENLIVE PO LIQD: 237.0000 mL | Freq: Three times a day (TID) | ORAL | 3 refills | Status: DC

## 2023-08-17 MED ORDER — SODIUM CHLORIDE 0.9% FLUSH
3.0000 mL | Freq: Two times a day (BID) | INTRAVENOUS | Status: DC
Start: 2023-08-17 — End: 2023-08-17

## 2023-08-17 MED ORDER — NICOTINE 21 MG/24HR TD PT24: 21.0000 mg | MEDICATED_PATCH | Freq: Every day | TRANSDERMAL | 0 refills | Status: DC

## 2023-08-17 MED ORDER — DM-GUAIFENESIN ER 30-600 MG PO TB12: 1.0000 | ORAL_TABLET | Freq: Two times a day (BID) | ORAL | 1 refills | Status: DC

## 2023-08-17 NOTE — Progress Notes (Signed)
Mobility Specialist Progress Note:    08/17/23 0845  Mobility  Activity Transferred from bed to chair  Level of Assistance Standby assist, set-up cues, supervision of patient - no hands on  Assistive Device None  Distance Ambulated (ft) 4 ft  Range of Motion/Exercises Active;All extremities  Activity Response Tolerated well  Mobility Referral Yes  $Mobility charge 1 Mobility  Mobility Specialist Start Time (ACUTE ONLY) 0845  Mobility Specialist Stop Time (ACUTE ONLY) 0855  Mobility Specialist Time Calculation (min) (ACUTE ONLY) 10 min   Pt received in bed, agreeable to mobility. Required SBA to stand and ambulate with no AD. Tolerated well, deferred ambulation d/t SOB. Left pt in chair, RN in room, all needs met.   Lawerance Bach Mobility Specialist Please contact via Special educational needs teacher or  Rehab office at [phone]

## 2023-08-17 NOTE — Progress Notes (Signed)
IV to left forearm removed. Telemetry monitor removed and returned to desk. Went over discharge instruction with patient. Patient voiced an understanding. Patient is awaiting for her transportation.

## 2023-08-17 NOTE — TOC Transition Note (Signed)
Transition of Care [facility]) - CM/SW Discharge Note   Patient Details  Name: Casey [name] MRN: [id number] Date of Birth: [birth year]  Transition of Care [facility]) CM/SW Contact:  Villa Herb, LCSWA Phone Number: 08/17/2023, 10:22 AM   Clinical Narrative:    CSW updated that PT is recommending HH PT for pt at D/C. CSW spoke with pt to explain that there are no HH agency's that accept pts insurance and we would need to make an outpatient PT referral. Pt is agreeable to this. OP PT referral made at this time. TOC signing off.   Final next level of care: OP Rehab Barriers to Discharge: Barriers Resolved   Patient Goals and CMS Choice CMS Medicare.gov Compare Post Acute Care list provided to:: Patient Choice offered to / list presented to : Patient  Discharge Placement                         Discharge Plan and Services Additional resources added to the After Visit Summary for   In-house Referral: Clinical Social Work Discharge Planning Services: CM Consult                                 Social Determinants of Health (SDOH) Interventions SDOH Screenings   Food Insecurity: Food Insecurity Present (08/15/2023)  Housing: Low Risk  (08/15/2023)  Transportation Needs: Unmet Transportation Needs (08/15/2023)  Utilities: Not At Risk (08/15/2023)  Recent Concern: Utilities - At Risk (07/31/2023)  Depression (PHQ2-9): Medium Risk (02/24/2023)  Tobacco Use: Medium Risk (08/15/2023)     Readmission Risk Interventions    08/16/2023    2:16 PM 07/27/2023   10:34 AM  Readmission Risk Prevention Plan  Transportation Screening Complete Complete  HRI or Home Care Consult  Complete  Social Work Consult for Recovery Care Planning/Counseling  Complete  Palliative Care Screening  Not Applicable  Medication Review Oceanographer) Complete Complete  HRI or Home Care Consult Complete   SW Recovery Care/Counseling Consult Complete   Palliative Care Screening Not Applicable    Skilled Nursing Facility Not Applicable

## 2023-08-17 NOTE — Discharge Instructions (Signed)
1)You need oxygen at home at 2 L via nasal cannula continuously while awake and while asleep--- smoking or having open fires around oxygen can cause fire, significant injury and death  2)Complete abstinence from tobacco advised--- okay to use nicotine patch to help you quit smoking  3)follow up with a primary care physician within a week for recheck and reevaluation

## 2023-08-17 NOTE — Discharge Summary (Signed)
Casey [name], is a 62 y.o. female  DOB 1961-04-14  MRN [id number].  Admission date:  08/14/2023  Admitting Physician  Shon Hale, MD  Discharge Date:  08/17/2023   Primary MD  Patient, No Pcp Per  Recommendations for primary care physician for things to follow:   1)You need oxygen at home at 2 L via nasal cannula continuously while awake and while asleep--- smoking or having open fires around oxygen can cause fire, significant injury and death  2)Complete abstinence from tobacco advised--- okay to use nicotine patch to help you quit smoking  3)follow up with a primary care physician within a week for recheck and reevaluation  Admission Diagnosis  COPD exacerbation (HCC) [J44.1] COPD with acute exacerbation (HCC) [J44.1]   Discharge Diagnosis  COPD exacerbation (HCC) [J44.1] COPD with acute exacerbation (HCC) [J44.1]    Principal Problem:   COPD with acute exacerbation (HCC) Active Problems:   Tobacco abuse   Depression, recurrent (HCC)   Acute and chronic respiratory failure with hypoxia (HCC)   Hypokalemia      Past Medical History:  Diagnosis Date   Anxiety    Chronic back pain    COPD (chronic obstructive pulmonary disease) (HCC)    Depression    History of panic attacks 09/11/2014   Medical history non-contributory    Pneumonia    Polysubstance abuse (HCC)    History of taking non-prescribed opiates, BZDs; also +cocaine and THC   Ruptured disc, thoracic    Suicide attempt (HCC) 2002    Past Surgical History:  Procedure Laterality Date   CESAREAN SECTION     CLAVICLE SURGERY     REVERSE SHOULDER ARTHROPLASTY Left 01/01/2020   Procedure: LEFT REVERSE SHOULDER ARTHROPLASTY;  Surgeon: Cammy Copa, MD;  Location: [facility] OR;  Service: Orthopedics;  Laterality: Left;   TUBAL LIGATION         HPI  from the history and physical done on the day of admission:  Casey [name]  is  a 62 y.o. female with Pmhx relevant for ongoing tobacco abuse, COPD, hypoxic respiratory failure supposed to be on 2 L of oxygen at home, history of polysubstance abuse including benzos opioids cocaine and THC (UDS is actually negative today) who was recently treated at this hospital for acute hypoxic respiratory failure/COPD exacerbation secondary to pneumonia from 07/26/2023 through 07/31/2023 she returns today with worsening hypoxia -She is supposed to be using 2 L of oxygen via nasal cannula appears not to be compliant due to tobacco use -Currently in the ED requiring up to 5 L of oxygen via nasal cannula -Two-view chest x-ray without new consolidation, chronic COPD/interstitial type changes noted -CBC with a WBC of 9.1 Hgb of 11.8 and platelets of 324 -Potassium is 3.1 sodium 138 bicarb 29 creatinine 0.46 with a glucose of 153 LFTs are not elevated -UA with glucosuria and ketones but not suggestive of UTI -Patient reports productive cough and worsening dyspnea -On 4 L of oxygen after bronchodilators O2 sats was down to 88 and 89% patient  was increased to 5 L of oxygen with O2 sats up to 90%, however even on 5 L of oxygen when patient got up to the bedside commode O2 sat dropped to 84% on 5 L     Hospital Course:   Brief Narrative:  62 y.o. female with Pmhx relevant for ongoing tobacco abuse, COPD, hypoxic respiratory failure supposed to be on 2 L of oxygen at home, history of polysubstance abuse including benzos opioids cocaine and THC (UDS is actually negative today) who was recently treated at this hospital for acute hypoxic respiratory failure/COPD exacerbation secondary to pneumonia from 07/26/2023 through 07/31/2023 she returns today with worsening hypoxia -She is supposed to be using 2 L of oxygen via nasal cannula appears not to be compliant due to tobacco use.  Readmitted on 08/14/2023 with acute on chronic hypoxic respiratory failure in the setting of acute COPD exacerbation with ongoing  tobacco use --High risk for readmission due to noncompliance   -Assessment and Plan: 1)Acute COPD Exacerbation---- no definite pneumonia,  -Treated with IV Solu-Medrol C/n Mucolytics, doxycycline and bronchodilators as ordered, supplemental oxygen as ordered.  -Recently discharged on 07/31/2023 after treatment for pneumonia -Respiratory status/improved -Successfully weaned back down to 2 L of oxygen via nasal cannula -Incentive spirometry advised -Sadly at home patient does not use O2 as prescribed because she has to take the oxygen off to smoke -Discharged home on prednisone and doxycycline -High risk for readmission due to noncompliance   2)Acute on chronic respiratory failure with hypoxia  - -Due to #1 above -Discharged home on 07/31/2023 on 2 L of oxygen at home, patient admits to noncompliance due to the need to smoke. -Hypoxia and respiratory status improved as outlined in #1 above   3)Hypokalemia--- resolved with replacement   4) chronic normocytic anemia  - -Hgb at baseline -No obvious bleeding concerns at this time -Transfuse as clinically indicated   5)Tobacco Abuse--smoking cessation advised, use nicotine patch  6)Hyperglycemia--recent A1c 6.0 --Anticipate resolution of hypoglycemia once patient comes off steroids     7)Protein caloric malnutrition--- nutritional supplements advised -Smoking cessation will help to improve her appetite   8)Generalized weakness--- physical therapy evaluation appreciated  -Outpatient physical therapy arranged for patient  9) social/ethics--patient is very anxious about going home Apparently her 32 year old son who she lives with will be going to jail tomorrow 08/17/2023 for 6 months for drug offenses -Patient admits to history of polysubstance abuse, as per pt both patient and son use street/illicit drugs --High risk for readmission due to noncompliance   Disposition: The patient is from: Home              Anticipated d/c is to:  Home  Discharge Condition: stable  Follow UP- PCP  Diet and Activity recommendation:  As advised  Discharge Instructions    Discharge Instructions     Ambulatory referral to Physical Therapy   Complete by: As directed    Ambulatory referral to Physical Therapy   Complete by: As directed    Call MD for:  difficulty breathing, headache or visual disturbances   Complete by: As directed    Call MD for:  persistant dizziness or light-headedness   Complete by: As directed    Call MD for:  persistant nausea and vomiting   Complete by: As directed    Call MD for:  temperature >100.4   Complete by: As directed    Diet - low sodium heart healthy   Complete by: As directed    Discharge  instructions   Complete by: As directed    1)You need oxygen at home at 2 L via nasal cannula continuously while awake and while asleep--- smoking or having open fires around oxygen can cause fire, significant injury and death  2)Complete abstinence from tobacco advised--- okay to use nicotine patch to help you quit smoking  3)follow up with a primary care physician within a week for recheck and reevaluation   Increase activity slowly   Complete by: As directed        Discharge Medications     Allergies as of 08/17/2023   No Known Allergies      Medication List     STOP taking these medications    ibuprofen 200 MG tablet Commonly known as: ADVIL       TAKE these medications    acetaminophen 325 MG tablet Commonly known as: TYLENOL Take 2 tablets (650 mg total) by mouth every 6 (six) hours as needed for mild pain (or Fever >/= 101).   albuterol 108 (90 Base) MCG/ACT inhaler Commonly known as: VENTOLIN HFA Inhale 2 puffs into the lungs every 6 (six) hours as needed for wheezing or shortness of breath.   cholecalciferol 25 MCG (1000 UNIT) tablet Commonly known as: VITAMIN D3 Take 1,000 Units by mouth daily.   citalopram 20 MG tablet Commonly known as: CELEXA Take 1 tablet (20  mg total) by mouth daily.   clonazePAM 0.5 MG tablet Commonly known as: KLONOPIN Take 1 tablet (0.5 mg total) by mouth 2 (two) times daily.   dextromethorphan-guaiFENesin 30-600 MG 12hr tablet Commonly known as: MUCINEX DM Take 1 tablet by mouth 2 (two) times daily.   doxycycline 100 MG tablet Commonly known as: VIBRA-TABS Take 1 tablet (100 mg total) by mouth 2 (two) times daily for 5 days.   feeding supplement Liqd Take 237 mLs by mouth 3 (three) times daily between meals.   fluticasone furoate-vilanterol 100-25 MCG/ACT Aepb Commonly known as: BREO ELLIPTA Inhale 1 puff into the lungs daily.   guaifenesin 100 MG/5ML syrup Commonly known as: ROBITUSSIN Take 200 mg by mouth 3 (three) times daily as needed for cough.   hydrOXYzine 50 MG tablet Commonly known as: ATARAX Take 1 tablet (50 mg total) by mouth 3 (three) times daily as needed for anxiety. What changed: reasons to take this   nicotine 21 mg/24hr patch Commonly known as: NICODERM CQ - dosed in mg/24 hours Place 1 patch (21 mg total) onto the skin daily. Start taking on: August 18, 2023   predniSONE 20 MG tablet Commonly known as: DELTASONE Take 2 tablets (40 mg total) by mouth daily with breakfast for 5 days.   traZODone 50 MG tablet Commonly known as: DESYREL Take 1 tablet (50 mg total) by mouth at bedtime as needed for sleep.       Major procedures and Radiology Reports - PLEASE review detailed and final reports for all details, in brief -   DG Chest 2 View  Result Date: 08/14/2023 CLINICAL DATA:  Shortness of breath EXAM: CHEST - 2 VIEW COMPARISON:  X-ray 07/26/2023 FINDINGS: No pneumothorax or effusion. Normal cardiopericardial silhouette. Diffuse interstitial changes are again seen and likely chronic. No new consolidation. Left shoulder reverse arthroplasty. IMPRESSION: Interstitial changes again seen, possibly chronic. Electronically Signed   By: Karen Kays M.D.   On: 08/14/2023 13:13   DG Chest  Port 1 View  Result Date: 07/26/2023 CLINICAL DATA:  Cough, hypoxia, COPD EXAM: PORTABLE CHEST 1 VIEW COMPARISON:  Radiographs  and CT from 07/17/2023 FINDINGS: Bilateral patchy airspace and interstitial opacities. Chronic interstitial coarsening. No pleural effusion or pneumothorax. Stable cardiomediastinal silhouette. Left reverse TSA. IMPRESSION: Patchy airspace and interstitial opacities, increased from 07/17/2023 and favoring multifocal pneumonia. Electronically Signed   By: Minerva Fester M.D.   On: 07/26/2023 02:50    Today   Subjective    Kamaria Naro today has no new complaints No fevers -Respiratory status has improved -Oxygen weaned down to 2 L via nasal cannula --High risk for readmission due to noncompliance   Patient has been seen and examined prior to discharge   Objective   Blood pressure 122/68, pulse 90, temperature 98 F (36.7 C), temperature source Oral, resp. rate 20, height 5\' 5"  (1.651 m), weight 56.7 kg, SpO2 94%.   Intake/Output Summary (Last 24 hours) at 08/17/2023 1428 Last data filed at 08/17/2023 0900 Gross per 24 hour  Intake 720 ml  Output 1 ml  Net 719 ml   Exam Gen:- Awake Alert, no acute distress , no conversational dyspnea HEENT:- [place].AT, No sclera icterus Nose- [place] 2L/min Neck-Supple Neck,No JVD,.  Lungs-improved air movement, no wheezing  CV- S1, S2 normal, regular Abd-  +ve B.Sounds, Abd Soft, No tenderness,    Extremity/Skin:- No  edema,   good pulses Psych-affect is appropriate, oriented x3 Neuro-no new focal deficits, no tremors    Data Review   CBC w Diff:  Lab Results  Component Value Date   WBC 9.1 08/14/2023   HGB 11.8 (L) 08/14/2023   HGB 13.7 12/23/2022   HCT 35.6 (L) 08/14/2023   HCT 40.2 12/23/2022   PLT 324 08/14/2023   PLT 449 12/23/2022   LYMPHOPCT 13 08/14/2023   MONOPCT 3 08/14/2023   EOSPCT 1 08/14/2023   BASOPCT 0 08/14/2023   CMP:  Lab Results  Component Value Date   NA 139 08/15/2023   NA 142  12/23/2022   K 4.4 08/15/2023   CL 102 08/15/2023   CO2 28 08/15/2023   BUN 11 08/15/2023   BUN 7 (L) 12/23/2022   CREATININE 0.50 08/15/2023   PROT 6.7 08/14/2023   PROT 7.6 12/23/2022   ALBUMIN 3.3 (L) 08/14/2023   ALBUMIN 4.5 12/23/2022   BILITOT 0.5 08/14/2023   BILITOT 0.2 12/23/2022   ALKPHOS 86 08/14/2023   AST 12 (L) 08/14/2023   ALT 27 08/14/2023   Total Discharge time is about 33 minutes  Shon Hale M.D on 08/17/2023 at 2:28 PM  Go to [email or web address] -  for contact info  [facility] - Office  [phone]

## 2023-08-17 NOTE — Telephone Encounter (Signed)
Called to confirm 08/19/23 appt lvm to confirm by 12:00 pm 08/18/23

## 2023-08-19 ENCOUNTER — Other Ambulatory Visit (HOSPITAL_COMMUNITY): Payer: Self-pay

## 2023-08-19 ENCOUNTER — Ambulatory Visit (HOSPITAL_COMMUNITY): Payer: MEDICAID | Admitting: Psychiatry

## 2023-08-19 ENCOUNTER — Encounter (HOSPITAL_COMMUNITY): Payer: Self-pay

## 2023-08-26 ENCOUNTER — Ambulatory Visit (INDEPENDENT_AMBULATORY_CARE_PROVIDER_SITE_OTHER): Payer: MEDICAID | Admitting: Family Medicine

## 2023-08-26 ENCOUNTER — Encounter: Payer: Self-pay | Admitting: Family Medicine

## 2023-08-26 VITALS — BP 125/79 | HR 107 | Ht 65.0 in | Wt 128.1 lb

## 2023-08-26 DIAGNOSIS — Z23 Encounter for immunization: Secondary | ICD-10-CM | POA: Diagnosis not present

## 2023-08-26 DIAGNOSIS — J449 Chronic obstructive pulmonary disease, unspecified: Secondary | ICD-10-CM

## 2023-08-26 DIAGNOSIS — Z1159 Encounter for screening for other viral diseases: Secondary | ICD-10-CM

## 2023-08-26 DIAGNOSIS — E7849 Other hyperlipidemia: Secondary | ICD-10-CM

## 2023-08-26 DIAGNOSIS — Z122 Encounter for screening for malignant neoplasm of respiratory organs: Secondary | ICD-10-CM

## 2023-08-26 DIAGNOSIS — J441 Chronic obstructive pulmonary disease with (acute) exacerbation: Secondary | ICD-10-CM | POA: Diagnosis not present

## 2023-08-26 DIAGNOSIS — Z1211 Encounter for screening for malignant neoplasm of colon: Secondary | ICD-10-CM

## 2023-08-26 DIAGNOSIS — Z114 Encounter for screening for human immunodeficiency virus [HIV]: Secondary | ICD-10-CM

## 2023-08-26 DIAGNOSIS — E559 Vitamin D deficiency, unspecified: Secondary | ICD-10-CM

## 2023-08-26 DIAGNOSIS — R7301 Impaired fasting glucose: Secondary | ICD-10-CM | POA: Diagnosis not present

## 2023-08-26 DIAGNOSIS — Z720 Tobacco use: Secondary | ICD-10-CM | POA: Diagnosis not present

## 2023-08-26 DIAGNOSIS — Z1231 Encounter for screening mammogram for malignant neoplasm of breast: Secondary | ICD-10-CM

## 2023-08-26 DIAGNOSIS — E038 Other specified hypothyroidism: Secondary | ICD-10-CM

## 2023-08-26 MED ORDER — AZITHROMYCIN 250 MG PO TABS
ORAL_TABLET | ORAL | 0 refills | Status: AC
Start: 2023-08-26 — End: 2023-08-31

## 2023-08-26 MED ORDER — GUAIFENESIN 100 MG/5ML PO LIQD
5.0000 mL | ORAL | 0 refills | Status: DC | PRN
Start: 2023-08-26 — End: 2023-11-11

## 2023-08-26 MED ORDER — PREDNISONE 20 MG PO TABS
40.0000 mg | ORAL_TABLET | Freq: Every day | ORAL | 0 refills | Status: AC
Start: 2023-08-26 — End: 2023-08-31

## 2023-08-26 NOTE — Patient Instructions (Addendum)
I appreciate the opportunity to provide care to you today!    Follow up:  4 months  Fasting Labs: please stop by the lab today/ during the week  to get your blood drawn (CBC, CMP, TSH, Lipid profile, HgA1c, Vit D)  Screening: HIV and Hep C  COPD Exacerbation: -A refill for your cough medication has been ordered. -I have started you on prednisone 40 mg for 5 days to reduce inflammation in the lower airways. -Azithromycin has also been prescribed to address any bacterial infection in the lungs. - A referral has been placed to pulmonary for lung cancer screening, COPD management, and oxygen therapy evaluation.  -Please have pulmonary and ophthalmology complete their respective sections of the form and return it to me, so I can finalize and fax it to the [facility] for your driver's license.  Referrals today- pulmonary of lung cancer screening  Please continue to a heart-healthy diet and increase your physical activities. Try to exercise for at least five days a week.    It was a pleasure to see you and I look forward to continuing to work together on your health and well-being. Please do not hesitate to call the office if you need care or have questions about your care.  In case of emergency, please visit the Emergency Department for urgent care, or contact our clinic at [phone] to schedule an appointment. We're here to help you!   Have a wonderful day and week. With Gratitude, Gilmore Laroche MSN, FNP-BC

## 2023-08-26 NOTE — Progress Notes (Signed)
New Patient Office Visit  Subjective:  Patient ID: Casey [name], female    DOB: 1961-06-17  Age: 62 y.o. MRN: [id number]  CC:  Chief Complaint  Patient presents with   New Patient (Initial Visit)    Establishing care. Has paper work to have completed to keep her drivers license. Also needs orders for oxygen.     HPI Casey [name] is a 62 y.o. female with past medical history of  presents for establishing care.  Tobacco abuse:The patient reports a 24-year smoking history and notes that she quit on June 24, 2023. She previously smoked half a pack of cigarettes daily.  COPD:The patient reports compliance with her albuterol inhaler and maintenance inhaler, BREO Ellipta. She complains of increased breathing difficulty, shortness of breath, and a cough with green sputum. She also reports frequent use of her inhaler. The patient is currently on 2 L of oxygen in the clinic, and her caregiver suggests that she may require additional oxygen supplementation to meet her needs.   Past Medical History:  Diagnosis Date   Anxiety    Chronic back pain    COPD (chronic obstructive pulmonary disease) (HCC)    Depression    History of panic attacks 09/11/2014   Medical history non-contributory    Pneumonia    Polysubstance abuse (HCC)    History of taking non-prescribed opiates, BZDs; also +cocaine and THC   Ruptured disc, thoracic    Suicide attempt (HCC) 2002    Past Surgical History:  Procedure Laterality Date   CESAREAN SECTION     CLAVICLE SURGERY     REVERSE SHOULDER ARTHROPLASTY Left 01/01/2020   Procedure: LEFT REVERSE SHOULDER ARTHROPLASTY;  Surgeon: Cammy Copa, MD;  Location: [facility] OR;  Service: Orthopedics;  Laterality: Left;   TUBAL LIGATION      Family History  Problem Relation Age of Onset   Diabetes Mother    Pulmonary fibrosis Mother    Heart attack Father    Parkinson's disease Sister    Bipolar disorder Sister    Thyroid disease Sister    Heart attack  Brother    Other Son        vascular neucrosis   Pulmonary fibrosis Maternal Grandmother    Diabetes Maternal Grandfather    Stroke Maternal Grandfather    Pneumonia Paternal Grandfather    Cancer Sister        melonoma   Other Brother        back problems, nerve problems, soft bones    Social History   Socioeconomic History   Marital status: Single    Spouse name: Not on file   Number of children: Not on file   Years of education: Not on file   Highest education level: Not on file  Occupational History   Not on file  Tobacco Use   Smoking status: Former    Current packs/day: 0.25    Types: Cigarettes   Smokeless tobacco: Never  Vaping Use   Vaping status: Never Used  Substance and Sexual Activity   Alcohol use: Yes    Alcohol/week: 1.0 standard drink of alcohol    Types: 1 Cans of beer per week    Comment: occ    Drug use: Not Currently    Types: "Crack" cocaine, Benzodiazepines   Sexual activity: Not Currently    Birth control/protection: Post-menopausal  Other Topics Concern   Not on file  Social History Narrative   Not on file   Social Determinants  of Health   Financial Resource Strain: Not on file  Food Insecurity: Food Insecurity Present (08/15/2023)   Hunger Vital Sign    Worried About Running Out of Food in the Last Year: Often true    Ran Out of Food in the Last Year: Often true  Transportation Needs: Unmet Transportation Needs (08/15/2023)   PRAPARE - Administrator, Civil Service (Medical): Yes    Lack of Transportation (Non-Medical): No  Physical Activity: Not on file  Stress: Not on file  Social Connections: Not on file  Intimate Partner Violence: Not At Risk (08/15/2023)   Humiliation, Afraid, Rape, and Kick questionnaire    Fear of Current or Ex-Partner: No    Emotionally Abused: No    Physically Abused: No    Sexually Abused: No    ROS Review of Systems  Constitutional:  Negative for chills and fever.  Eyes:  Negative for  visual disturbance.  Respiratory:  Positive for cough and wheezing. Negative for chest tightness and shortness of breath.   Neurological:  Negative for dizziness and headaches.    Objective:   Today's Vitals: BP 125/79   Pulse (!) 107   Ht 5\' 5"  (1.651 m)   Wt 128 lb 1.3 oz (58.1 kg)   LMP  (LMP Unknown)   SpO2 (!) 83%   BMI 21.31 kg/m   Physical Exam HENT:     Head: Normocephalic.     Mouth/Throat:     Mouth: Mucous membranes are moist.  Cardiovascular:     Rate and Rhythm: Normal rate.     Heart sounds: Normal heart sounds.  Pulmonary:     Effort: Pulmonary effort is normal.     Breath sounds: Wheezing present.  Neurological:     Mental Status: She is alert.      Assessment & Plan:   COPD with acute exacerbation [facility]) Assessment & Plan: Will treat today for COPD exacerbation with prednisone 40 mg daily for 5 days and azithromycin for 5 days Encouraged rest, increase hydration, and treatment compliance    Orders: -     predniSONE; Take 2 tablets (40 mg total) by mouth daily for 5 days.  Dispense: 10 tablet; Refill: 0 -     Azithromycin; Take 2 tablets on day 1, then 1 tablet daily on days 2 through 5  Dispense: 6 tablet; Refill: 0 -     guaiFENesin; Take 5 mLs by mouth every 4 (four) hours as needed.  Dispense: 120 mL; Refill: 0  Tobacco abuse Assessment & Plan: A referral has been placed to pulmonary for lung cancer screening. The patient has requested an evaluation by a pulmonologist for oxygen titration.    Encounter for immunization Assessment & Plan: Patient educated on CDC recommendation for the vaccine. Verbal consent was obtained from the patient, vaccine administered by nurse, no sign of adverse reactions noted at this time. Patient education on arm soreness and use of tylenol or ibuprofen for this patient  was discussed. Patient educated on the signs and symptoms of adverse effect and advise to contact the office if they occur.   Orders: -     Flu  vaccine trivalent PF, 6mos and older(Flulaval,Afluria,Fluarix,Fluzone) -     Pneumococcal conjugate vaccine 20-valent  Chronic obstructive pulmonary disease, unspecified COPD type (HCC) -     Ambulatory referral to Pulmonology  Breast cancer screening by mammogram -     3D Screening Mammogram, Left and Right  Colon cancer screening -  Cologuard  IFG (impaired fasting glucose) -     Hemoglobin A1c  Vitamin D deficiency -     VITAMIN D 25 Hydroxy (Vit-D Deficiency, Fractures)  Encounter for screening for HIV -     HIV Antibody (routine testing w rflx)  Need for hepatitis C screening test -     Hepatitis C antibody  TSH (thyroid-stimulating hormone deficiency) -     TSH + free T4  Other hyperlipidemia -     Lipid panel -     CMP14+EGFR -     CBC with Differential/Platelet  Screening for lung cancer -     Ambulatory Referral for Lung Cancer Scre    Note: This chart has been completed using Engineer, civil (consulting) software, and while attempts have been made to ensure accuracy, certain words and phrases may not be transcribed as intended.       Follow-up: Return in about 4 months (around 12/27/2023).   Gilmore Laroche, FNP

## 2023-08-26 NOTE — Assessment & Plan Note (Addendum)
Will treat today for COPD exacerbation with prednisone 40 mg daily for 5 days and azithromycin for 5 days Encouraged rest, increase hydration, and treatment compliance

## 2023-08-26 NOTE — Assessment & Plan Note (Addendum)
A referral has been placed to pulmonary for lung cancer screening. The patient has requested an evaluation by a pulmonologist for oxygen titration.

## 2023-08-26 NOTE — Assessment & Plan Note (Signed)
Patient educated on CDC recommendation for the vaccine. Verbal consent was obtained from the patient, vaccine administered by nurse, no sign of adverse reactions noted at this time. Patient education on arm soreness and use of tylenol or ibuprofen for this patient  was discussed. Patient educated on the signs and symptoms of adverse effect and advise to contact the office if they occur.

## 2023-08-27 ENCOUNTER — Telehealth: Payer: Self-pay | Admitting: Acute Care

## 2023-08-27 LAB — CBC WITH DIFFERENTIAL/PLATELET
Basophils Absolute: 0 10*3/uL (ref 0.0–0.2)
Basos: 0 %
EOS (ABSOLUTE): 0.3 10*3/uL (ref 0.0–0.4)
Eos: 2 %
Hematocrit: 40.8 % (ref 34.0–46.6)
Hemoglobin: 12.9 g/dL (ref 11.1–15.9)
Immature Grans (Abs): 0 10*3/uL (ref 0.0–0.1)
Immature Granulocytes: 0 %
Lymphocytes Absolute: 3.6 10*3/uL — ABNORMAL HIGH (ref 0.7–3.1)
Lymphs: 20 %
MCH: 28 pg (ref 26.6–33.0)
MCHC: 31.6 g/dL (ref 31.5–35.7)
MCV: 89 fL (ref 79–97)
Monocytes Absolute: 1.3 10*3/uL — ABNORMAL HIGH (ref 0.1–0.9)
Monocytes: 7 %
Neutrophils Absolute: 12.8 10*3/uL — ABNORMAL HIGH (ref 1.4–7.0)
Neutrophils: 71 %
Platelets: 475 10*3/uL — ABNORMAL HIGH (ref 150–450)
RBC: 4.6 x10E6/uL (ref 3.77–5.28)
RDW: 15.4 % (ref 11.7–15.4)
WBC: 18.1 10*3/uL — ABNORMAL HIGH (ref 3.4–10.8)

## 2023-08-27 LAB — CMP14+EGFR
ALT: 17 [IU]/L (ref 0–32)
AST: 7 [IU]/L (ref 0–40)
Albumin: 3.8 g/dL — ABNORMAL LOW (ref 3.9–4.9)
Alkaline Phosphatase: 106 [IU]/L (ref 44–121)
BUN/Creatinine Ratio: 14 (ref 12–28)
BUN: 8 mg/dL (ref 8–27)
Bilirubin Total: 0.3 mg/dL (ref 0.0–1.2)
CO2: 29 mmol/L (ref 20–29)
Calcium: 9.5 mg/dL (ref 8.7–10.3)
Chloride: 99 mmol/L (ref 96–106)
Creatinine, Ser: 0.56 mg/dL — ABNORMAL LOW (ref 0.57–1.00)
Globulin, Total: 2.5 g/dL (ref 1.5–4.5)
Glucose: 122 mg/dL — ABNORMAL HIGH (ref 70–99)
Potassium: 3.9 mmol/L (ref 3.5–5.2)
Sodium: 143 mmol/L (ref 134–144)
Total Protein: 6.3 g/dL (ref 6.0–8.5)
eGFR: 102 mL/min/{1.73_m2} (ref >59)

## 2023-08-27 LAB — TSH+FREE T4
Free T4: 1.18 ng/dL (ref 0.82–1.77)
TSH: 0.095 u[IU]/mL — ABNORMAL LOW (ref 0.450–4.500)

## 2023-08-27 LAB — LIPID PANEL
Chol/HDL Ratio: 2.7 {ratio} (ref 0.0–4.4)
Cholesterol, Total: 189 mg/dL (ref 100–199)
HDL: 69 mg/dL (ref >39)
LDL Chol Calc (NIH): 105 mg/dL — ABNORMAL HIGH (ref 0–99)
Triglycerides: 85 mg/dL (ref 0–149)
VLDL Cholesterol Cal: 15 mg/dL (ref 5–40)

## 2023-08-27 LAB — HEMOGLOBIN A1C
Est. average glucose Bld gHb Est-mCnc: 126 mg/dL
Hgb A1c MFr Bld: 6 % — ABNORMAL HIGH (ref 4.8–5.6)

## 2023-08-27 LAB — HIV ANTIBODY (ROUTINE TESTING W REFLEX): HIV Screen 4th Generation wRfx: NONREACTIVE

## 2023-08-27 LAB — VITAMIN D 25 HYDROXY (VIT D DEFICIENCY, FRACTURES): Vit D, 25-Hydroxy: 22.2 ng/mL — ABNORMAL LOW (ref 30.0–100.0)

## 2023-08-27 LAB — HEPATITIS C ANTIBODY: Hep C Virus Ab: NONREACTIVE

## 2023-08-27 NOTE — Telephone Encounter (Signed)
See referral note

## 2023-08-27 NOTE — Telephone Encounter (Signed)
Answering Service:  Patient is calling to get scheduled for her lung caner screening.

## 2023-08-28 ENCOUNTER — Other Ambulatory Visit: Payer: Self-pay | Admitting: Family Medicine

## 2023-08-28 DIAGNOSIS — E0590 Thyrotoxicosis, unspecified without thyrotoxic crisis or storm: Secondary | ICD-10-CM

## 2023-08-28 DIAGNOSIS — E559 Vitamin D deficiency, unspecified: Secondary | ICD-10-CM

## 2023-08-28 MED ORDER — VITAMIN D (ERGOCALCIFEROL) 1.25 MG (50000 UNIT) PO CAPS
50000.0000 [IU] | ORAL_CAPSULE | ORAL | 1 refills | Status: DC
Start: 2023-08-28 — End: 2024-03-17

## 2023-08-28 NOTE — Progress Notes (Signed)
Please inform the patient that her vitamin D is low, and a prescription for a weekly vitamin D supplement has been sent to her pharmacy for her to start taking. Her labs indicate subclinical hyperthyroidism, and orders have been placed for her to come in and repeat her thyroid levels in 6 weeks, around October 10, 2023. Her labs also indicate that she is prediabetic, and I recommend decreasing her intake of high-sugar foods and beverages while increasing physical activity.

## 2023-09-01 ENCOUNTER — Ambulatory Visit (INDEPENDENT_AMBULATORY_CARE_PROVIDER_SITE_OTHER): Payer: MEDICAID | Admitting: Internal Medicine

## 2023-09-01 ENCOUNTER — Encounter: Payer: Self-pay | Admitting: Internal Medicine

## 2023-09-01 VITALS — BP 135/81 | HR 79 | Ht 65.0 in | Wt 121.0 lb

## 2023-09-01 DIAGNOSIS — J9601 Acute respiratory failure with hypoxia: Secondary | ICD-10-CM

## 2023-09-01 MED ORDER — AMOXICILLIN-POT CLAVULANATE 875-125 MG PO TABS: 1.0000 | ORAL_TABLET | Freq: Two times a day (BID) | ORAL | 0 refills | Status: DC

## 2023-09-01 MED ORDER — BREZTRI AEROSPHERE 160-9-4.8 MCG/ACT IN AERO: 2.0000 | INHALATION_SPRAY | Freq: Two times a day (BID) | RESPIRATORY_TRACT | Status: DC

## 2023-09-01 NOTE — Progress Notes (Signed)
Casey [name], female    DOB: Mar 29, 1961    MRN: [id number]   Brief patient profile:  8   yowf  Quit smoking  Aug 2024  referred to pulmonary clinic in [place]  09/01/2023 by [facility]  for new dx respiratory failure p apparent asp pna   onset x 2022 difficulty with mowing / with URI> chest dx as recurrent bronchitis always responded to Rx with just abx until Aug 2014:  Date of Admission: 06/25/2023 Admitted from: home     Discharge: Date of discharge: 06/26/23 Disposition:  [facility] Condition at discharge: stable   CODE STATUS: FULL CODE       Discharge Physician: Casey Nielsen, DO [facility]       PCP: Patient, No Pcp Per   Recommendations for Outpatient Follow-up:  Pending clinical course              Discharge Diagnoses: Principal Problem:   Aspiration pneumonia (HCC) Active Problems:   Depression, recurrent (HCC)   GAD (generalized anxiety disorder)   Acute respiratory failure with hypoxia (HCC)   Polysubstance abuse (HCC)   Acute metabolic encephalopathy             Hospital Course: Casey [name] is a 62 y.o. female with medical history significant of anxiety, chronic pain, depression, suicide attempt, presents the ED with a chief complaint of altered mental status. Per H&P noted CC was "shortness of breath, and she is requiring oxygen, but the ED reported that she was brought in for unresponsiveness. When asked the patient herself she says she does not know why she was brought in and she cannot tell me what she remembers prior to being brought into the hospital. Daughter had reported to the ED provider that patient was taking Xanax and opiates that were not prescribed."  08/16: in evening, to ED, UDS(+) cocaine, THC, benzo. CXR concern for multifocal PNA. Given narcan which improved alertness and then caused agitation.  08/17: early AM admitted to hospitalist service for aspiration pneumonia Requiring HFNC. Alert this  morning. WOB increasing throughout afternoon and CXR appears worse. Reached out to PCCM, trial BiPap here appears improved / less WOB, ABG hypoxic. D/t high risk for intubation, per Casey [name], recs for transport to [facility] while stable.   Consultants:  none   Procedures: none           ASSESSMENT & PLAN:   Acute metabolic/toxic encephalopathy d/t PNA/Hypoxis as well as polysubstance  Treat underlying causes as below    Aspiration pneumonia (HCC) Right side multi-focal pneumonia Unasyn started in ED, but patient does have ampicillin allergy Continue Rocephin Culture sputum Continue albuterol as needed Supplemental O2 Bronchodilators prn Worsening this afternoon in terms of WOB, SpO2 93% 7L HFNC repeat CXR --> appears worsening infiltrate ABG pending --> hypoxic BiPap --> normalized reps rate, pt appears more comfortable    Chest pain Tachycardia Suspect costochondritis complicated by GERD Likely component of o9piate withdrawal  High risk for cardiac however  Reproducible on palpation  Troponin pending  CXR EKG prn    Polysubstance abuse (HCC) Likely opiate withdrawal  Positive for benzos, cocaine, and THC Question false negative opiate screen, certain fentanyl needs special UDS Reportedly taking xanax and opiates unprescribed PDMP no Rx on file Son states she's taking "who knows what" street drugs  TOC consult avoid controlled substances when possible   Headache, mild Tylenol, Ibuprofen    Sore throat Cepacol and chloraseptic prn    Acute  hypoxic respiratory failure d/t PNA/encephalopathy as above  Supplemental O2 as needed   GAD (generalized anxiety disorder) Depression, recurrent (HCC) Continue celexa Monitor for withdrawal from benzos - although not prescribed, they are in her urine. The chronicity of her use is unknown.       History of Present Illness  09/01/2023  Pulmonary/ 1st office eval/ Casey [name] / [place] [facility] maint on  BREO  Chief Complaint   Patient presents with   Establish Care  Dyspnea:  walked across the parking lot to get into building but that's all she's done since last admit.  Cough: green x a week no better p zpak  Sleep: bed is flat with 3 pillows SABA use: last used around 5 h prior to OV   02: 2lpm 24/7   No obvious day to day or daytime pattern/variability or assoc  mucus plugs or hemoptysis or cp or chest tightness, subjective wheeze or overt sinus or hb symptoms.    Also denies any obvious fluctuation of symptoms with weather or environmental changes or other aggravating or alleviating factors except as outlined above   No unusual exposure hx or h/o childhood pna/ asthma or knowledge of premature birth.  Current Allergies, Complete Past Medical History, Past Surgical History, Family History, and Social History were reviewed in [employer] record.  ROS  The following are not active complaints unless bolded Hoarseness, sore throat, dysphagia, dental problems, itching, sneezing,  nasal congestion or discharge of excess mucus or purulent secretions, ear ache,   fever, chills, sweats, unintended wt loss or wt gain, classically pleuritic or exertional cp,  orthopnea pnd or arm/hand swelling  or leg swelling, presyncope, palpitations, abdominal pain, anorexia, nausea, vomiting, diarrhea  or change in bowel habits or change in bladder habits, change in stools or change in urine, dysuria, hematuria,  rash, arthralgias, visual complaints, headache, numbness, weakness or ataxia or problems with walking or coordination,  change in mood or  memory.              Outpatient Medications Prior to Visit  Medication Sig Dispense Refill   acetaminophen (TYLENOL) 325 MG tablet Take 2 tablets (650 mg total) by mouth every 6 (six) hours as needed for mild pain (or Fever >/= 101).     albuterol (VENTOLIN HFA) 108 (90 Base) MCG/ACT inhaler Inhale 2 puffs into the lungs every 6 (six) hours as needed for wheezing  or shortness of breath. 18 g 1   cholecalciferol (VITAMIN D3) 25 MCG (1000 UNIT) tablet Take 1,000 Units by mouth daily.     citalopram (CELEXA) 20 MG tablet Take 1 tablet (20 mg total) by mouth daily. 90 tablet 3   feeding supplement (ENSURE ENLIVE / ENSURE PLUS) LIQD Take 237 mLs by mouth 3 (three) times daily between meals. 23700 mL 3   fluticasone furoate-vilanterol (BREO ELLIPTA) 100-25 MCG/ACT AEPB Inhale 1 puff into the lungs daily. 28 each 3   guaiFENesin (ROBITUSSIN) 100 MG/5ML liquid Take 5 mLs by mouth every 4 (four) hours as needed. 120 mL 0   guaifenesin (ROBITUSSIN) 100 MG/5ML syrup Take 200 mg by mouth 3 (three) times daily as needed for cough.     hydrOXYzine (ATARAX) 50 MG tablet Take 1 tablet (50 mg total) by mouth 3 (three) times daily as needed for anxiety. 90 tablet 3   nicotine (NICODERM CQ - DOSED IN MG/24 HOURS) 21 mg/24hr patch Place 1 patch (21 mg total) onto the skin daily. 28 patch 0   traZODone (  DESYREL) 50 MG tablet Take 1 tablet (50 mg total) by mouth at bedtime as needed for sleep. 30 tablet 3   Vitamin D, Ergocalciferol, (DRISDOL) 1.25 MG (50000 UNIT) CAPS capsule Take 1 capsule (50,000 Units total) by mouth every 7 (seven) days. 20 capsule 1   clonazePAM (KLONOPIN) 0.5 MG tablet Take 1 tablet (0.5 mg total) by mouth 2 (two) times daily. (Patient not taking: Reported on 08/26/2023) 10 tablet 0   dextromethorphan-guaiFENesin (MUCINEX DM) 30-600 MG 12hr tablet Take 1 tablet by mouth 2 (two) times daily. (Patient not taking: Reported on 08/26/2023) 20 tablet 1   No facility-administered medications prior to visit.    Past Medical History:  Diagnosis Date   Anxiety    Chronic back pain    COPD (chronic obstructive pulmonary disease) (HCC)    Depression    History of panic attacks 09/11/2014   Medical history non-contributory    Pneumonia    Polysubstance abuse (HCC)    History of taking non-prescribed opiates, BZDs; also +cocaine and THC   Ruptured disc,  thoracic    Suicide attempt (HCC) 2002      Objective:     BP 135/81   Pulse 79   Ht 5\' 5"  (1.651 m)   Wt 121 lb (54.9 kg)   LMP  (LMP Unknown)   SpO2 93%   BMI 20.14 kg/m   SpO2: 93 % O2 Type: Continuous O2 O2 Flow Rate (L/min): 2 L/min  Chronically ill appearing amb wf nad   HEENT : Oropharynx  clear / dentition very poor  Nasal turbinates nl    NECK :  without  apparent JVD/ palpable Nodes/TM    LUNGS: no acc muscle use,  Min barrel  contour chest wall with bilateral  slightly decreased bs s audible wheeze and  without cough on insp or exp maneuvers and min  Hyperresonant  to  percussion bilaterally    CV:  RRR  no s3 or murmur or increase in P2, and no edema   ABD:  soft and nontender with pos end  insp Hoover's  in the supine position.  No bruits or organomegaly appreciated   MS:  Nl gait/ ext warm without deformities Or obvious joint restrictions  calf tenderness, cyanosis or clubbing     SKIN: warm and dry without lesions    NEURO:  alert, approp, nl sensorium with  no motor or cerebellar deficits apparent.         I personally reviewed images and agree with radiology impression as follows:  CXR:   pa and lateral 08/14/23  No acute findings   I personally reviewed images and agree with radiology impression as follows:   Chest CTa    07/17/23 1. No evidence of pulmonary embolus. Evaluation of the segmental and subsegmental pulmonary arteries is somewhat limited due to respiratory motion artifact. 2. Bilateral patchy ground-glass opacities with more focal areas of consolidation and areas of interstitial thickening, likely resolving sequela of prior multifocal pneumonia. Recommend follow-up chest CT in 3 months to ensure continued resolution. 3. Indeterminate nodule of the right adrenal gland measuring 1.6 cm. Recommend further evaluation with nonemergent adrenal protocol CT. 4. Coronary artery calcifications and aortic Atherosclerosis      Assessment    Acute respiratory failure with hypoxia (HCC) Dx 06/2023 in setting of asp pna/ very poor nutrition - 09/01/2023   Walked on 2lpm cont  x  3  lap(s) =  approx 450  ft  @ mod pace, stopped  due to end of study  with lowest 02 sats 89% and sob at end plus fatigue    She does not appear to have much copd and any fibrosis now would be more post inflammatory, a total different setting than IPF so all she needs to to for now is maintain off cigs at all costs and return with pfts w/in 3 m to determine whether or not continued outpt f/u needed.   In meantime can just use saba prn like a Group A copd pt   - The proper method of use, as well as anticipated side effects, of a metered-dose inhaler were discussed and demonstrated to the patient using teach back method. Improved effectiveness after extensive  training to about 50% from a baseline of near 0 ( very short Ti, no breath hold)   For cough/ congestion > mucinex or mucinex dm  up to maximum of  1200 mg every 12 hours and use the flutter valve as much as you can    Rx purulent mucus? Underlying sinusitis with augmentin bid x 10 days     Each maintenance medication was reviewed in detail including emphasizing most importantly the difference between maintenance and prns and under what circumstances the prns are to be triggered using an action plan format where appropriate.  Total time for H and P, chart review, counseling, reviewing hfa/02/ pulse ox  device(s) , directly observing portions of ambulatory 02 saturation study/ and generating customized AVS unique to this office visit / same day charting  new pt eval                   Sandrea Hughs, MD 09/01/2023

## 2023-09-01 NOTE — Assessment & Plan Note (Addendum)
Dx 06/2023 in setting of asp pna/ very poor nutrition - 09/01/2023   Walked on 2lpm cont  x  3  lap(s) =  approx 450  ft  @ mod pace, stopped due to end of study  with lowest 02 sats 89% and sob at end plus fatigue    She does not appear to have much copd and any fibrosis now would be more post inflammatory, a total different setting than IPF so all she needs to to for now is maintain off cigs at all costs and return with pfts w/in 3 m to determine whether or not continued outpt f/u needed.   In meantime can just use saba prn like a Group A copd pt   - The proper method of use, as well as anticipated side effects, of a metered-dose inhaler were discussed and demonstrated to the patient using teach back method. Improved effectiveness after extensive  training to about 50% from a baseline of near 0 ( very short Ti, no breath hold)   For cough/ congestion > mucinex or mucinex dm  up to maximum of  1200 mg every 12 hours and use the flutter valve as much as you can    Rx purulent mucus? Underlying sinusitis with augmentin bid x 10 days     Each maintenance medication was reviewed in detail including emphasizing most importantly the difference between maintenance and prns and under what circumstances the prns are to be triggered using an action plan format where appropriate.  Total time for H and P, chart review, counseling, reviewing hfa/02/ pulse ox  device(s) , directly observing portions of ambulatory 02 saturation study/ and generating customized AVS unique to this office visit / same day charting  new pt eval

## 2023-09-01 NOTE — Patient Instructions (Addendum)
Augmentin 875 mg take one pill twice daily  X 10 days - take at breakfast and supper with large glass of water.  It would help reduce the usual side effects (diarrhea and yeast infections) if you ate cultured yogurt at lunch.   For cough/ congestion > stop robitussin and take mucinex or mucinex dm  up to maximum of  1200 mg every 12 hours and use the flutter valve as much as you can    Plan A = Automatic = Always=   Start  Breztri Take 2 puffs first thing in am and then another 2 puffs about 12 hours later.    Work on inhaler technique:  relax and gently blow all the way out then take a nice smooth full deep breath back in, triggering the inhaler at same time you start breathing in.  Hold breath in for at least  5 seconds if you can. Blow out breztri thru nose. Rinse and gargle with water when done.  If mouth or throat bother you at all,  try brushing teeth/gums/tongue with arm and hammer toothpaste/ make a slurry and gargle and spit out.   >>>  Remember how golfers warm up by taking practice swings - do this with an empty inhaler   Plan B = Backup (to supplement plan A, not to replace it) Only use your albuterol inhaler as a rescue medication to be used if you can't catch your breath by resting or doing a relaxed purse lip breathing pattern.  - The less you use it, the better it will work when you need it. - Ok to use the inhaler up to 2 puffs  every 4 hours if you must but call for appointment if use goes up over your usual need - Don't leave home without it !!  (think of it like the spare tire for your car)     Make sure you check your oxygen saturation  AT  your highest level of activity (not after you stop)   to be sure it stays over 90% and adjust  02 flow upward to maintain this level if needed but remember to turn it back to previous settings when you stop (to conserve your supply).    Please schedule a follow up office visit in 2 weeks, sooner if needed  with all medications /inhalers/  solutions in hand so we can verify exactly what you are taking. This includes all medications from all doctors and over the counters

## 2023-09-02 ENCOUNTER — Telehealth: Payer: Self-pay | Admitting: Internal Medicine

## 2023-09-02 NOTE — Telephone Encounter (Signed)
Called to discuss scheduling the 2 weeks follow up with Dr. Sherene Sires per 09/01/23 AVS.  No answer and voice mail is full

## 2023-09-27 NOTE — Progress Notes (Unsigned)
Casey [name], female    DOB: [birth year]    MRN: [id number]   Brief patient profile:  64  yowf  Quit smoking  Aug 2024  referred to pulmonary clinic in [place]  09/01/2023 by [facility]  for new dx respiratory failure p apparent asp pna   onset x 2022 difficulty with mowing / with URI> chest dx as recurrent bronchitis always responded to Rx with just abx until Aug 2014:  Date of Admission: 06/25/2023 Admitted from: home     Discharge: Date of discharge: 06/26/23 Disposition:  [facility] Condition at discharge: stable   CODE STATUS: FULL CODE       Discharge Physician: Sunnie Nielsen, DO [facility]       PCP: Patient, No Pcp Per   Recommendations for Outpatient Follow-up:  Pending clinical course              Discharge Diagnoses: Principal Problem:   Aspiration pneumonia (HCC) Active Problems:   Depression, recurrent (HCC)   GAD (generalized anxiety disorder)   Acute respiratory failure with hypoxia (HCC)   Polysubstance abuse (HCC)   Acute metabolic encephalopathy             Hospital Course: Casey [name] is a 62 y.o. female with medical history significant of anxiety, chronic pain, depression, suicide attempt, presents the ED with a chief complaint of altered mental status. Per H&P noted CC was "shortness of breath, and she is requiring oxygen, but the ED reported that she was brought in for unresponsiveness. When asked the patient herself she says she does not know why she was brought in and she cannot tell me what she remembers prior to being brought into the hospital. Daughter had reported to the ED provider that patient was taking Xanax and opiates that were not prescribed."  08/16: in evening, to ED, UDS(+) cocaine, THC, benzo. CXR concern for multifocal PNA. Given narcan which improved alertness and then caused agitation.  08/17: early AM admitted to hospitalist service for aspiration pneumonia Requiring HFNC. Alert this morning.  WOB increasing throughout afternoon and CXR appears worse. Reached out to PCCM, trial BiPap here appears improved / less WOB, ABG hypoxic. D/t high risk for intubation, per Dr. Delton Coombes, recs for transport to [facility] while stable.           ASSESSMENT & PLAN:   Acute metabolic/toxic encephalopathy d/t PNA/Hypoxis as well as polysubstance  Treat underlying causes as below    Aspiration pneumonia (HCC) Right side multi-focal pneumonia Unasyn started in ED, but patient does have ampicillin allergy Continue Rocephin Culture sputum Continue albuterol as needed Supplemental O2 Bronchodilators prn Worsening this afternoon in terms of WOB, SpO2 93% 7L HFNC repeat CXR --> appears worsening infiltrate ABG pending --> hypoxic BiPap --> normalized reps rate, pt appears more comfortable    Chest pain Tachycardia Suspect costochondritis complicated by GERD Likely component of o9piate withdrawal  High risk for cardiac however  Reproducible on palpation  Troponin pending  CXR EKG prn    Polysubstance abuse (HCC) Likely opiate withdrawal  Positive for benzos, cocaine, and THC Question false negative opiate screen, certain fentanyl needs special UDS Reportedly taking xanax and opiates unprescribed PDMP no Rx on file Son states she's taking "who knows what" street drugs  TOC consult avoid controlled substances when possible   Headache, mild Tylenol, Ibuprofen    Sore throat Cepacol and chloraseptic prn    Acute hypoxic respiratory failure d/t PNA/encephalopathy as above  Supplemental O2  as needed   GAD (generalized anxiety disorder) Depression, recurrent (HCC) Continue celexa Monitor for withdrawal from benzos - although not prescribed, they are in her urine. The chronicity of her use is unknown.       History of Present Illness  09/01/2023  Pulmonary/ 1st office eval/ Casey [name] / [place] [facility] maint on  BREO  Chief Complaint  Patient presents with   Establish Care  Dyspnea:   walked across the parking lot to get into building but that's all she's done since last admit.  Cough: green x a week no better p zpak  Sleep: bed is flat with 3 pillows SABA use: last used around 5 h prior to OV   02: 2lpm 24/7  Rec Augmentin 875 mg take one pill twice daily  X 10 days For cough/ congestion > stop robitussin and take mucinex or mucinex dm  up to maximum of  1200 mg every 12 hours and use the flutter valve as much as you can   Plan A = Automatic = Always=   Start  Breztri Take 2 puffs first thing in am and then another 2 puffs about 12 hours later.   Work on inhaler technique:  >>>  Remember how golfer warm up by taking practice swings - do this with an empty inhaler  Plan B = Backup (to supplement plan A, not to replace it) Only use your albuterol inhaler as a rescue medication  Make sure you check your oxygen saturation  AT  your highest level of activity (not after you stop)   to be sure it stays over 90% Please schedule a follow up office visit in 2 weeks, sooner if needed  with all medications /inhalers/ solutions in hand  09/28/2023  f/u ov/[place] office/Casey [name] re: resp failure/02 dep since  06/2023 in setting of asp pna/ poor dentition  maint on breztri  did not  bring meds Chief Complaint  Patient presents with   Shortness of Breath   Dyspnea:  [employer] s 02 - rides buggy at [employer]  Cough: clear  Sleeping: bed is flat 2 pillows    resp cc  SABA use: twice since last  02: 2lpm hs and does not titrate  Lung cancer screening: not due until 07/2024    No obvious day to day or daytime variability or assoc excess/ purulent sputum or mucus plugs or hemoptysis or cp or chest tightness, subjective wheeze or overt sinus or hb symptoms.    Also denies any obvious fluctuation of symptoms with weather or environmental changes or other aggravating or alleviating factors except as outlined above   No unusual exposure hx or h/o childhood pna/ asthma or knowledge of  premature birth.  Current Allergies, Complete Past Medical History, Past Surgical History, Family History, and Social History were reviewed in [employer] record.  ROS  The following are not active complaints unless bolded Hoarseness, sore throat, dysphagia, dental problems, itching, sneezing,  nasal congestion or discharge of excess mucus or purulent secretions, ear ache,   fever, chills, sweats, unintended wt loss or wt gain, classically pleuritic or exertional cp,  orthopnea pnd or arm/hand swelling  or leg swelling, presyncope, palpitations, abdominal pain, anorexia, nausea, vomiting, diarrhea  or change in bowel habits or change in bladder habits, change in stools or change in urine, dysuria, hematuria,  rash, arthralgias, visual complaints, headache, numbness, weakness or ataxia or problems with walking or coordination,  change in mood or  memory.  Current Meds  Medication Sig   acetaminophen (TYLENOL) 325 MG tablet Take 2 tablets (650 mg total) by mouth every 6 (six) hours as needed for mild pain (or Fever >/= 101).   albuterol (VENTOLIN HFA) 108 (90 Base) MCG/ACT inhaler Inhale 2 puffs into the lungs every 6 (six) hours as needed for wheezing or shortness of breath.   Budeson-Glycopyrrol-Formoterol (BREZTRI AEROSPHERE) 160-9-4.8 MCG/ACT AERO Inhale 2 puffs into the lungs in the morning and at bedtime.   cholecalciferol (VITAMIN D3) 25 MCG (1000 UNIT) tablet Take 1,000 Units by mouth daily.   citalopram (CELEXA) 20 MG tablet Take 1 tablet (20 mg total) by mouth daily.   clonazePAM (KLONOPIN) 0.5 MG tablet Take 1 tablet (0.5 mg total) by mouth 2 (two) times daily.   feeding supplement (ENSURE ENLIVE / ENSURE PLUS) LIQD Take 237 mLs by mouth 3 (three) times daily between meals.   guaiFENesin (ROBITUSSIN) 100 MG/5ML liquid Take 5 mLs by mouth every 4 (four) hours as needed.   guaifenesin (ROBITUSSIN) 100 MG/5ML syrup Take 200 mg by mouth 3 (three) times daily as  needed for cough.   hydrOXYzine (ATARAX) 50 MG tablet Take 1 tablet (50 mg total) by mouth 3 (three) times daily as needed for anxiety.   traZODone (DESYREL) 50 MG tablet Take 1 tablet (50 mg total) by mouth at bedtime as needed for sleep.   Vitamin D, Ergocalciferol, (DRISDOL) 1.25 MG (50000 UNIT) CAPS capsule Take 1 capsule (50,000 Units total) by mouth every 7 (seven) days.          Past Medical History:  Diagnosis Date   Anxiety    Chronic back pain    COPD (chronic obstructive pulmonary disease) (HCC)    Depression    History of panic attacks 09/11/2014   Medical history non-contributory    Pneumonia    Polysubstance abuse (HCC)    History of taking non-prescribed opiates, BZDs; also +cocaine and THC   Ruptured disc, thoracic    Suicide attempt (HCC) 2002      Objective:    Wts   09/28/2023      127   09/01/23 121 lb (54.9 kg)  08/26/23 128 lb 1.3 oz (58.1 kg)  08/14/23 125 lb (56.7 kg)      Vital signs reviewed  09/28/2023  - Note at rest 02 sats  89% on 2lpm    General appearance:    amb very animated elderly wf focused on whether I will prescribe clonazepam (already turned down by PCP_    HEENT : Oropharynx  clear/ poor dentition   Nasal turbinates nl    NECK :  without  apparent JVD/ palpable Nodes/TM    LUNGS: no acc muscle use,  Min barrel  contour chest wall with bilateral   pan exp wheeze and  with cough on insp and  exp maneuvers and min  Hyperresonant  to  percussion bilaterally    CV:  RRR  no s3 or murmur or increase in P2, and no edema   ABD:  soft and nontender with pos end  insp Hoover's  in the supine position.  No bruits or organomegaly appreciated   MS:  Nl gait/ ext warm without deformities Or obvious joint restrictions  calf tenderness, cyanosis or clubbing     SKIN: warm and dry without lesions    NEURO:  alert, approp, nl sensorium with  no motor or cerebellar deficits apparent.  Assessment                24

## 2023-09-28 ENCOUNTER — Ambulatory Visit (INDEPENDENT_AMBULATORY_CARE_PROVIDER_SITE_OTHER): Payer: MEDICAID | Admitting: Internal Medicine

## 2023-09-28 ENCOUNTER — Encounter: Payer: Self-pay | Admitting: Internal Medicine

## 2023-09-28 VITALS — BP 143/81 | HR 96 | Ht 65.0 in | Wt 127.0 lb

## 2023-09-28 DIAGNOSIS — J4489 Other specified chronic obstructive pulmonary disease: Secondary | ICD-10-CM | POA: Insufficient documentation

## 2023-09-28 DIAGNOSIS — J9601 Acute respiratory failure with hypoxia: Secondary | ICD-10-CM

## 2023-09-28 DIAGNOSIS — J449 Chronic obstructive pulmonary disease, unspecified: Secondary | ICD-10-CM | POA: Insufficient documentation

## 2023-09-28 MED ORDER — SPIRIVA RESPIMAT 2.5 MCG/ACT IN AERS: 2.0000 | INHALATION_SPRAY | Freq: Every day | RESPIRATORY_TRACT | Status: DC

## 2023-09-28 MED ORDER — BUDESONIDE-FORMOTEROL FUMARATE 160-4.5 MCG/ACT IN AERO: INHALATION_SPRAY | RESPIRATORY_TRACT | 11 refills | Status: DC

## 2023-09-28 MED ORDER — PREDNISONE 10 MG PO TABS: ORAL_TABLET | ORAL | 0 refills | Status: DC

## 2023-09-28 MED ORDER — SPIRIVA RESPIMAT 2.5 MCG/ACT IN AERS: 2.0000 | INHALATION_SPRAY | Freq: Every day | RESPIRATORY_TRACT | 11 refills | Status: DC

## 2023-09-28 NOTE — Patient Instructions (Addendum)
For cough/ congestion > stop robitussin and take  mucinex dm  up to maximum of  1200 mg every 12 hours and use the flutter valve as much as you can    Plan A = Automatic = Always=   Start  spiriva and symbicort  160  Take 2 puffs first thing in am and then another 2 puffs of symbicort about 12 hours later.    Work on inhaler technique:  relax and gently blow all the way out then take a nice smooth full deep breath back in, triggering the inhaler at same time you start breathing in.  Hold breath in for at least  5 seconds if you can. Blow out breztri thru nose. Rinse and gargle with water when done.  If mouth or throat bother you at all,  try brushing teeth/gums/tongue with arm and hammer toothpaste/ make a slurry and gargle and spit out.   >>>  Remember how golfers warm up by taking practice swings - do this with an empty inhaler   Plan B = Backup (to supplement plan A, not to replace it) Only use your albuterol inhaler as a rescue medication to be used if you can't catch your breath by resting or doing a relaxed purse lip breathing pattern.  - The less you use it, the better it will work when you need it. - Ok to use the inhaler up to 2 puffs  every 4 hours if you must but call for appointment if use goes up over your usual need - Don't leave home without it !!  (think of it like the spare tire for your car)     Make sure you check your oxygen saturation  AT  your highest level of activity (not after you stop)   to be sure it stays over 90% and adjust  02 flow upward to maintain this level if needed but remember to turn it back to previous settings when you stop (to conserve your supply).   Try prilosec otc 20mg   Take 30-60 min before first meal of the day and Pepcid ac (famotidine) 20 mg one @  bedtime until cough is completely gone for at least a week without the need for cough suppression   Prednisone 10 mg take  4 each am x 2 days,   2 each am x 2 days,  1 each am x 2 days and stop     Please schedule a follow up office visit in 6  weeks, sooner if needed  with all medications /inhalers/ solutions in hand so we can verify exactly what you are taking. This includes all medications from all doctors and over the counters

## 2023-09-29 NOTE — Assessment & Plan Note (Signed)
Dx 06/2023 in setting of asp pna/ poor dentition  - 09/01/2023   Walked on 2lpm cont  x  3  lap(s) =  approx 450  ft  @ mod pace, stopped due to end of study  with lowest 02 sats 89% and sob at end plus fatigue   - 09/28/2023   Walked on 2lpm cont  x  2  lap(s) =  approx 300  ft  @ slow pace, stopped due to tired and sob  with lowest 02 sats 89%    Advised: Make sure you check your oxygen saturation  AT  your highest level of activity (not after you stop)   to be sure it stays over 90% and adjust  02 flow upward to maintain this level if needed but remember to turn it back to previous settings when you stop (to conserve your supply).    Each maintenance medication was reviewed in detail including emphasizing most importantly the difference between maintenance and prns and under what circumstances the prns are to be triggered using an action plan format where appropriate.  Total time for H and P, chart review, counseling, reviewing hfa/smi/flutter/02 /pulse ox  device(s) , directly observing portions of ambulatory 02 saturation study/ and generating customized AVS unique to this office visit / same day charting = 41 min

## 2023-09-29 NOTE — Assessment & Plan Note (Signed)
Reports stopped smoking  Aug 2024  - 09/28/2023  After extensive coaching inhaler device,  effectiveness =    60% with hfa and smi  - 09/28/2023 start symbicort 160/spiriva 2.5   samples and f/u 6 weeks  Very skeptical about adherence to smoking abstinence and med use but for now rec:   For cough/ congestion > stop robitussin and take  mucinex dm  up to maximum of  1200 mg every 12 hours and use the flutter valve as much as you can    Plan A = Automatic = Always=   Start  spiriva and symbicort  160  Take 2 puffs first thing in am and then another 2 puffs of symbicort about 12 hours later.    Work on inhaler technique: >>>  Remember how golfers warm up by taking practice swings - do this with an empty inhaler   Plan B = Backup (to supplement plan A, not to replace it) Only use your albuterol inhaler as a rescue medication to be used if you can't catch your breath by resting or doing a relaxed purse lip breathing pattern.  - The less you use it, the better it will work when you need it. - Ok to use the inhaler up to 2 puffs  every 4 hours if you must but call for appointment if use goes up over your usual need - Don't leave home without it !!  (think of it like the spare tire for your car)   Try prilosec otc 20mg   Take 30-60 min before first meal of the day and Pepcid ac (famotidine) 20 mg one @  bedtime until cough is completely gone for at least a week without the need for cough suppression   Prednisone 10 mg take  4 each am x 2 days,   2 each am x 2 days,  1 each am x 2 days and stop  F/u  in 6 weeks with all meds in hand using a trust but verify approach to confirm accurate Medication  Reconciliation The principal here is that until we are certain that the  patients are doing what we've asked, it makes no sense to ask them to do more.

## 2023-09-30 ENCOUNTER — Telehealth: Payer: Self-pay | Admitting: Family Medicine

## 2023-09-30 NOTE — Telephone Encounter (Signed)
DMV  Noted  Copied Sleeved  Original in PCP box Copy front desk folder

## 2023-10-12 ENCOUNTER — Telehealth: Payer: Self-pay | Admitting: Family Medicine

## 2023-10-12 NOTE — Telephone Encounter (Signed)
Pt reports she dropped off paperwork last week to the front desk.

## 2023-10-12 NOTE — Telephone Encounter (Signed)
Given to provider on 11.21.2024.  Copied from CRM [id number]. Topic: General - Other >> Oct 12, 2023  1:05 PM Herbert Seta B wrote: Reason for CRM: Patient calling because Malachi Bonds filled out medical paperwork for [facility] and was supposed to mail to patient?! Please call.

## 2023-10-13 ENCOUNTER — Telehealth: Payer: Self-pay | Admitting: Family Medicine

## 2023-10-13 ENCOUNTER — Telehealth: Payer: Self-pay | Admitting: Internal Medicine

## 2023-10-13 NOTE — Telephone Encounter (Signed)
Called patient

## 2023-10-13 NOTE — Telephone Encounter (Signed)
Patient is requesting Dr. Sherene Sires call in cough medicine for her at [facility] [place], [place]  [phone].  She states the over the counter medications are not working.  Patient call back---[phone]

## 2023-10-13 NOTE — Telephone Encounter (Signed)
completed

## 2023-10-13 NOTE — Telephone Encounter (Signed)
Ov with all meds in hand - ok to add to 4 pm slots but must bring all meds to regroup

## 2023-10-13 NOTE — Telephone Encounter (Signed)
Forms completed and faxed to [phone], let pt know, sent forms to scan.

## 2023-10-13 NOTE — Telephone Encounter (Signed)
LOV 09/28/23 given pred taper  Spoke with patient and she reports constant coughing at night, for "hours". She states cough is productive with light yellow mucus, possibly some chills, denies fever or other symptoms. She has tried Robitussin and Delsym with no relief. She would like rx cough medication sent in. She reports O2 at 94% on room air.   Please advise, thank you!

## 2023-10-13 NOTE — Telephone Encounter (Signed)
Forms faxed and sent to scan  °

## 2023-10-13 NOTE — Telephone Encounter (Signed)
Copied from CRM [id number]. Topic: General - Other >> Oct 12, 2023  1:05 PM Herbert Seta B wrote: Reason for CRM: Patient calling because Malachi Bonds filled out medical paperwork for [facility] and was supposed to mail to patient?! Please call.

## 2023-10-13 NOTE — Telephone Encounter (Signed)
Per MW he would like to see patient in office. Appt held on 10/15/23 @ 9:15am.   LVM for patient to return call.

## 2023-10-15 ENCOUNTER — Encounter: Payer: Self-pay | Admitting: Internal Medicine

## 2023-10-15 ENCOUNTER — Ambulatory Visit (INDEPENDENT_AMBULATORY_CARE_PROVIDER_SITE_OTHER): Payer: MEDICAID | Admitting: Internal Medicine

## 2023-10-15 VITALS — BP 153/81 | HR 74 | Ht 65.0 in | Wt 126.0 lb

## 2023-10-15 DIAGNOSIS — J4489 Other specified chronic obstructive pulmonary disease: Secondary | ICD-10-CM

## 2023-10-15 DIAGNOSIS — J9601 Acute respiratory failure with hypoxia: Secondary | ICD-10-CM | POA: Diagnosis not present

## 2023-10-15 MED ORDER — PANTOPRAZOLE SODIUM 40 MG PO TBEC: 40.0000 mg | DELAYED_RELEASE_TABLET | Freq: Every day | ORAL | 2 refills | Status: DC

## 2023-10-15 MED ORDER — METHYLPREDNISOLONE ACETATE 80 MG/ML IJ SUSP
120.0000 mg | Freq: Once | INTRAMUSCULAR | Status: AC
  Administered 2023-10-15: 120 mg via INTRAMUSCULAR

## 2023-10-15 MED ORDER — ALBUTEROL SULFATE (2.5 MG/3ML) 0.083% IN NEBU: 2.5000 mg | INHALATION_SOLUTION | Freq: Four times a day (QID) | RESPIRATORY_TRACT | 12 refills | Status: DC | PRN

## 2023-10-15 MED ORDER — FAMOTIDINE 20 MG PO TABS: ORAL_TABLET | ORAL | 11 refills | Status: DC

## 2023-10-15 NOTE — Assessment & Plan Note (Signed)
Reports stopped smoking  Aug 2024  - 09/28/2023  After extensive coaching inhaler device,  effectiveness =    60% with hfa and smi  - 09/28/2023 start symbicort 160/spiriva 2.5   samples and f/u 6 weeks   >>> 10/15/2023  After extensive coaching inhaler device,  effectiveness =    50% at best both smi and hfa > add neb alb q 4h prn  Having aecopd likely related to non - adherence so reviewed prev rx line by line as follows:  For cough/ congestion > stop robitussin and take  mucinex dm  up to maximum of  1200 mg every 12 hours and use the flutter valve as much as you can    Pantoprazole (protonix) 40 mg   Take  30-60 min before first meal of the day and Pepcid (famotidine)  20 mg after supper until cough is gone for a week    Plan A = Automatic = Always=    Symbicort 160 x 2 puffs 1st thing in am and chase with spiriva 2 puffs  then 12 hours later Symbicort   Work on inhaler technique:  relax and gently blow all the way out then take a nice smooth full deep breath back in, triggering the inhaler at same time you start breathing in.  Hold breath in for at least  5 seconds if you can. Blow out symbicort  thru nose. Rinse and gargle with water when done.  If mouth or throat bother you at all,  try brushing teeth/gums/tongue with arm and hammer toothpaste/ make a slurry and gargle and spit out.  >>>  Remember how golfers warm up by taking practice swings - do this with an empty inhaler    Plan B = Backup (to supplement plan A, not to replace it) Only use your albuterol inhaler as a rescue medication to be used if you can't catch your breath by resting or doing a relaxed purse lip breathing pattern.  - The less you use it, the better it will work when you need it. - Ok to use the inhaler up to 2 puffs  every 4 hours if you must but call for appointment if use goes up over your usual need - Don't leave home without it !!  (think of it like the spare tire for your car)     Plan C = Crisis (instead of  Plan B but only if Plan B stops working) - only use your albuterol nebulizer if you first try Plan B and it fails to help > ok to use the nebulizer up to every 4 hours but if start needing it regularly call for immediate appointment  Plan E = ER - go to ER or call 911 if all else fails    Depomedrol 120 mg IM

## 2023-10-15 NOTE — Assessment & Plan Note (Signed)
Dx 06/2023 in setting of asp pna/ poor dentition  - 09/01/2023   Walked on 2lpm cont  x  3  lap(s) =  approx 450  ft  @ mod pace, stopped due to end of study  with lowest 02 sats 89% and sob at end plus fatigue   - 09/28/2023   Walked on 2lpm cont  x  2  lap(s) =  approx 300  ft  @ slow pace, stopped due to tired and sob  with lowest 02 sats 89%    Again advised: Make sure you check your oxygen saturation  AT  your highest level of activity (not after you stop)   to be sure it stays over 90% and adjust  02 flow upward to maintain this level if needed but remember to turn it back to previous settings when you stop (to conserve your supply).   F/u as prev scheduled         Each maintenance medication was reviewed in detail including emphasizing most importantly the difference between maintenance and prns and under what circumstances the prns are to be triggered using an action plan format where appropriate.  Total time for H and P, chart review, counseling, reviewing hfa/neb/flutter 02 /pulse ox device(s) and generating customized AVS unique to this office visit / same day charting  40 min acute eval

## 2023-10-15 NOTE — Patient Instructions (Addendum)
For cough/ congestion > stop robitussin and take  mucinex dm  up to maximum of  1200 mg every 12 hours and use the flutter valve as much as you can    Pantoprazole (protonix) 40 mg   Take  30-60 min before first meal of the day and Pepcid (famotidine)  20 mg after supper until cough is gone for a week    Plan A = Automatic = Always=    Symbicort 160 x 2 puffs 1st thing in am and chase with spiriva 2 puffs  then 12 hours later Symbicort   Work on inhaler technique:  relax and gently blow all the way out then take a nice smooth full deep breath back in, triggering the inhaler at same time you start breathing in.  Hold breath in for at least  5 seconds if you can. Blow out symbicort  thru nose. Rinse and gargle with water when done.  If mouth or throat bother you at all,  try brushing teeth/gums/tongue with arm and hammer toothpaste/ make a slurry and gargle and spit out.  >>>  Remember how golfers warm up by taking practice swings - do this with an empty inhaler    Plan B = Backup (to supplement plan A, not to replace it) Only use your albuterol inhaler as a rescue medication to be used if you can't catch your breath by resting or doing a relaxed purse lip breathing pattern.  - The less you use it, the better it will work when you need it. - Ok to use the inhaler up to 2 puffs  every 4 hours if you must but call for appointment if use goes up over your usual need - Don't leave home without it !!  (think of it like the spare tire for your car)     Plan C = Crisis (instead of Plan B but only if Plan B stops working) - only use your albuterol nebulizer if you first try Plan B and it fails to help > ok to use the nebulizer up to every 4 hours but if start needing it regularly call for immediate appointment  Plan E = ER - go to ER or call 911 if all else fails     Make sure you check your oxygen saturation  AT  your highest level of activity (not after you stop)   to be sure it stays over 90% and  adjust  02 flow upward to maintain this level if needed but remember to turn it back to previous settings when you stop (to conserve your supply).   Depomedrol 120 mg IM   Keep previous appt if possible, go to ER if getting worse

## 2023-10-15 NOTE — Progress Notes (Signed)
Casey [name], female    DOB: [birth year]    MRN: [id number]   Brief patient profile:  27  yowf  Quit smoking  Aug 2024  referred to pulmonary clinic in [place]  09/01/2023 by [facility]  for new dx respiratory failure p apparent asp pna   onset x 2022 difficulty with mowing / with URI> chest dx as recurrent bronchitis always responded to Rx with just abx until Aug 2014:  Date of Admission: 06/25/2023 Admitted from: home     Discharge: Date of discharge: 06/26/23 Disposition:  [facility] Condition at discharge: stable   CODE STATUS: FULL CODE       Discharge Physician: Casey Nielsen, DO [facility]       PCP: Patient, No Pcp Per   Recommendations for Outpatient Follow-up:  Pending clinical course              Discharge Diagnoses: Principal Problem:   Aspiration pneumonia (HCC   Depression, recurrent (HCC)   GAD (generalized anxiety disorder)   Acute respiratory failure with hypoxia (HCC)   Polysubstance abuse (HCC)   Acute metabolic encephalopathy             Hospital Course: Casey [name] is a 62 y.o. female with medical history significant of anxiety, chronic pain, depression, suicide attempt, presents the ED with a chief complaint of altered mental status. Per H&P noted CC was "shortness of breath, and she is requiring oxygen, but the ED reported that she was brought in for unresponsiveness. When asked the patient herself she says she does not know why she was brought in and she cannot tell me what she remembers prior to being brought into the hospital. Daughter had reported to the ED provider that patient was taking Xanax and opiates that were not prescribed."  08/16: in evening, to ED, UDS(+) cocaine, THC, benzo. CXR concern for multifocal PNA. Given narcan which improved alertness and then caused agitation.  08/17: early AM admitted to hospitalist service for aspiration pneumonia Requiring HFNC. Alert this morning. WOB increasing  throughout afternoon and CXR appears worse. Reached out to PCCM, trial BiPap here appears improved / less WOB, ABG hypoxic. D/t high risk for intubation, per Dr. Delton Coombes, recs for transport to [facility] while stable.           ASSESSMENT & PLAN:   Acute metabolic/toxic encephalopathy d/t PNA/Hypoxis as well as polysubstance  Treat underlying causes as below    Aspiration pneumonia (HCC) Right side multi-focal pneumonia Unasyn started in ED, but patient does have ampicillin allergy Continue Rocephin Culture sputum Continue albuterol as needed Supplemental O2 Bronchodilators prn Worsening this afternoon in terms of WOB, SpO2 93% 7L HFNC repeat CXR --> appears worsening infiltrate ABG pending --> hypoxic BiPap --> normalized reps rate, pt appears more comfortable    Chest pain Tachycardia Suspect costochondritis complicated by GERD Likely component of o9piate withdrawal  High risk for cardiac however  Reproducible on palpation  Troponin pending  CXR EKG prn    Polysubstance abuse (HCC) Likely opiate withdrawal  Positive for benzos, cocaine, and THC Question false negative opiate screen, certain fentanyl needs special UDS Reportedly taking xanax and opiates unprescribed PDMP no Rx on file Son states she's taking "who knows what" street drugs  TOC consult avoid controlled substances when possible   Headache, mild Tylenol, Ibuprofen    Sore throat Cepacol and chloraseptic prn    Acute hypoxic respiratory failure d/t PNA/encephalopathy as above  Supplemental O2 as needed  GAD (generalized anxiety disorder) Depression, recurrent (HCC) Continue celexa Monitor for withdrawal from benzos - although not prescribed, they are in her urine. The chronicity of her use is unknown.       History of Present Illness  09/01/2023  Pulmonary/ 1st [name] eval/ Casey [name] / [name] maint on  BREO  Chief Complaint  Patient presents with   Establish Care  Dyspnea:  walked across  the parking lot to get into building but that's all she's done since last admit.  Cough: green x a week no better p zpak  Sleep: bed is flat with 3 pillows SABA use: last used around 5 h prior to OV   02: 2lpm 24/7  Rec Augmentin 875 mg take one pill twice daily  X 10 days For cough/ congestion > stop robitussin and take mucinex or mucinex dm  up to maximum of  1200 mg every 12 hours and use the flutter valve as much as you can   Plan A = Automatic = Always=   Start  Breztri Take 2 puffs first thing in am and then another 2 puffs about 12 hours later.   Work on inhaler technique:  >>>  Remember how golfer warm up by taking practice swings - do this with an empty inhaler  Plan B = Backup (to supplement plan A, not to replace it) Only use your albuterol inhaler as a rescue medication  Make sure you check your oxygen saturation  AT  your highest level of activity (not after you stop)   to be sure it stays over 90% Please schedule a follow up [name] visit in 2 weeks, sooner if needed  with all medications /inhalers/ solutions in hand  09/28/2023  f/u ov/Casey [name]/Casey [name] re: resp failure/02 dep since  06/2023 in setting of asp pna/ poor dentition  maint on breztri  did not  bring meds Chief Complaint  Patient presents with   Shortness of Breath   Dyspnea:  [employer] s 02 - rides buggy at [employer]  Cough: clear  Sleeping: bed is flat 2 pillows    resp cc  SABA use: twice since last  02: 2lpm hs and does not titrate Lung cancer screening: not due until 07/2024  Rec For cough/ congestion > stop robitussin and take  mucinex dm  up to maximum of  1200 mg every 12 hours and use the flutter valve as much as you can   Plan A = Automatic = Always=   Start  spiriva and symbicort  160  Take 2 puffs first thing in am and then another 2 puffs of symbicort about 12 hours later.   Work on inhaler technique:  >>>  Remember how golfers warm up by taking practice swings - do this with an empty  inhaler  Plan B = Backup (to supplement plan A, not to replace it) Only use your albuterol inhaler as a rescue medication Make sure you check your oxygen saturation  AT  your highest level of activity (not after you stop)   to be sure it stays over 90%  Try prilosec otc 20mg   Take 30-60 min before first meal of the day and Pepcid ac (famotidine) 20 mg one @  bedtime until cough is completely gone for at least a week  Prednisone 10 mg take  4 each am x 2 days,   2 each am x 2 days,  1 each am x 2 days and stop  Please schedule a follow up  [name] visit in 6  weeks, sooner if needed  with all medications /inhalers/ solutions in hand so we can verify exactly what you are taking. This includes all medications from all doctors and over the counters    10/15/2023  ACUTE  ov/[place] [name]/Casey [name] re: AB maint on ? Just one spiriva did not  bring  all meds / very confused with instructions above and did not follow any correctly /completely  Chief Complaint  Patient presents with   Acute Visit  Dyspnea:  now walking at [employer] where was riding scooter  Cough: worse x 5 d > white or clear  Sleeping: w/in 30 min  of hs starts coughing of flat bed one big pillow   SABA use: twice daily  02: 2lpm 24/ 7  > 90%  but not really titrating   No obvious day to day or daytime variability or assoc   purulent sputum or mucus plugs or hemoptysis or cp or chest tightness, subjective wheeze or overt sinus or hb symptoms.    Also denies any obvious fluctuation of symptoms with weather or environmental changes or other aggravating or alleviating factors except as outlined above   No unusual exposure hx or h/o childhood pna/ asthma or knowledge of premature birth.  Current Allergies, Complete Past Medical History, Past Surgical History, Family History, and Social History were reviewed in [employer] record.  ROS  The following are not active complaints unless bolded Hoarseness, sore throat,  dysphagia, dental problems, itching, sneezing,  nasal congestion or discharge of excess mucus or purulent secretions, ear ache,   fever, chills, sweats, unintended wt loss or wt gain, classically pleuritic or exertional cp,  orthopnea pnd or arm/hand swelling  or leg swelling, presyncope, palpitations, abdominal pain, anorexia, nausea, vomiting, diarrhea  or change in bowel habits or change in bladder habits, change in stools or change in urine, dysuria, hematuria,  rash, arthralgias, visual complaints, headache, numbness, weakness or ataxia or problems with walking or coordination,  change in mood or  memory.        Current Meds  Medication Sig   acetaminophen (TYLENOL) 325 MG tablet Take 2 tablets (650 mg total) by mouth every 6 (six) hours as needed for mild pain (or Fever >/= 101).   albuterol (VENTOLIN HFA) 108 (90 Base) MCG/ACT inhaler Inhale 2 puffs into the lungs every 6 (six) hours as needed for wheezing or shortness of breath.   budesonide-formoterol (SYMBICORT) 160-4.5 MCG/ACT inhaler Take 2 puffs first thing in am and then another 2 puffs about 12 hours later.   cholecalciferol (VITAMIN D3) 25 MCG (1000 UNIT) tablet Take 1,000 Units by mouth daily.   citalopram (CELEXA) 20 MG tablet Take 1 tablet (20 mg total) by mouth daily.   dextromethorphan-guaiFENesin (MUCINEX DM) 30-600 MG 12hr tablet Take 1 tablet by mouth 2 (two) times daily.   feeding supplement (ENSURE ENLIVE / ENSURE PLUS) LIQD Take 237 mLs by mouth 3 (three) times daily between meals.   guaiFENesin (ROBITUSSIN) 100 MG/5ML liquid Take 5 mLs by mouth every 4 (four) hours as needed.   guaifenesin (ROBITUSSIN) 100 MG/5ML syrup Take 200 mg by mouth 3 (three) times daily as needed for cough.   hydrOXYzine (ATARAX) 50 MG tablet Take 1 tablet (50 mg total) by mouth 3 (three) times daily as needed for anxiety.   Tiotropium Bromide Monohydrate (SPIRIVA RESPIMAT) 2.5 MCG/ACT AERS Inhale 2 puffs into the lungs daily.   traZODone (DESYREL)  50 MG tablet Take 1  tablet (50 mg total) by mouth at bedtime as needed for sleep.   Vitamin D, Ergocalciferol, (DRISDOL) 1.25 MG (50000 UNIT) CAPS capsule Take 1 capsule (50,000 Units total) by mouth every 7 (seven) days.          Past Medical History:  Diagnosis Date   Anxiety    Chronic back pain    COPD (chronic obstructive pulmonary disease) (HCC)    Depression    History of panic attacks 09/11/2014   Medical history non-contributory    Pneumonia    Polysubstance abuse (HCC)    History of taking non-prescribed opiates, BZDs; also +cocaine and THC   Ruptured disc, thoracic    Suicide attempt (HCC) 2002      Objective:    Wts  10/15/2023         126  09/28/2023      127   09/01/23 121 lb (54.9 kg)  08/26/23 128 lb 1.3 oz (58.1 kg)  08/14/23 125 lb (56.7 kg)    Vital signs reviewed  10/15/2023  - Note at rest 02 sats  94% on RA   General appearance:    elderly amb wf/ very congested sounding cough   HEENT : Oropharynx  clear/ poor dentition       NECK :  without  apparent JVD/ palpable Nodes/TM    LUNGS: no acc muscle use,  Mild barrel  contour chest wall with bilateral pan exp rhonchi and  without cough on insp or exp maneuvers  and mild  Hyperresonant  to  percussion bilaterally     CV:  RRR  no s3 or murmur or increase in P2, and no edema   ABD:  soft and nontender with pos end  insp Hoover's  in the supine position.  No bruits or organomegaly appreciated   MS:  Nl gait/ ext warm without deformities Or obvious joint restrictions  calf tenderness, cyanosis or clubbing     SKIN: warm and dry without lesions    NEURO:  alert, approp, nl sensorium with  no motor or cerebellar deficits apparent.   5cmild/ pan exp coarse rhonchi                  Assessment

## 2023-11-04 ENCOUNTER — Telehealth: Payer: Self-pay | Admitting: Internal Medicine

## 2023-11-04 NOTE — Telephone Encounter (Signed)
[facility] in [place]  Needs Nebulizer Medication Albuterol  Inhaler Spiriva  Also states she needs Trezadone for sleep

## 2023-11-06 ENCOUNTER — Other Ambulatory Visit: Payer: Self-pay

## 2023-11-06 ENCOUNTER — Emergency Department (HOSPITAL_COMMUNITY): Payer: MEDICAID

## 2023-11-06 ENCOUNTER — Emergency Department (HOSPITAL_COMMUNITY)
Admission: EM | Admit: 2023-11-06 | Discharge: 2023-11-06 | Disposition: A | Discharge status: Home / Self Care | Payer: MEDICAID | Attending: Emergency Medicine | Admitting: Emergency Medicine

## 2023-11-06 ENCOUNTER — Encounter (HOSPITAL_COMMUNITY): Payer: Self-pay | Admitting: Emergency Medicine

## 2023-11-06 DIAGNOSIS — Z7951 Long term (current) use of inhaled steroids: Secondary | ICD-10-CM | POA: Diagnosis not present

## 2023-11-06 DIAGNOSIS — J441 Chronic obstructive pulmonary disease with (acute) exacerbation: Secondary | ICD-10-CM | POA: Diagnosis not present

## 2023-11-06 DIAGNOSIS — R0602 Shortness of breath: Secondary | ICD-10-CM | POA: Diagnosis present

## 2023-11-06 DIAGNOSIS — R000 Tachycardia, unspecified: Secondary | ICD-10-CM | POA: Insufficient documentation

## 2023-11-06 DIAGNOSIS — Z1152 Encounter for screening for COVID-19: Secondary | ICD-10-CM | POA: Diagnosis not present

## 2023-11-06 DIAGNOSIS — F419 Anxiety disorder, unspecified: Secondary | ICD-10-CM

## 2023-11-06 LAB — COMPREHENSIVE METABOLIC PANEL
ALT: 11 U/L (ref 0–44)
AST: 12 U/L — ABNORMAL LOW (ref 15–41)
Albumin: 3.4 g/dL — ABNORMAL LOW (ref 3.5–5.0)
Alkaline Phosphatase: 69 U/L (ref 38–126)
Anion gap: 10 (ref 5–15)
BUN: 7 mg/dL — ABNORMAL LOW (ref 8–23)
CO2: 24 mmol/L (ref 22–32)
Calcium: 8.8 mg/dL — ABNORMAL LOW (ref 8.9–10.3)
Chloride: 104 mmol/L (ref 98–111)
Creatinine, Ser: 0.67 mg/dL (ref 0.44–1.00)
GFR, Estimated: >60 mL/min (ref >60)
Glucose, Bld: 205 mg/dL — ABNORMAL HIGH (ref 70–99)
Potassium: 3.1 mmol/L — ABNORMAL LOW (ref 3.5–5.1)
Sodium: 138 mmol/L (ref 135–145)
Total Bilirubin: 0.2 mg/dL (ref <1.2)
Total Protein: 6.9 g/dL (ref 6.5–8.1)

## 2023-11-06 LAB — BLOOD GAS, VENOUS
Acid-Base Excess: 1 mmol/L (ref 0.0–2.0)
Bicarbonate: 27.9 mmol/L (ref 20.0–28.0)
Drawn by: 45373
O2 Saturation: 41.7 %
Patient temperature: 36.8
pCO2, Ven: 53 mm[Hg] (ref 44–60)
pH, Ven: 7.33 (ref 7.25–7.43)
pO2, Ven: <31 mm[Hg] — CL (ref 32–45)

## 2023-11-06 LAB — BRAIN NATRIURETIC PEPTIDE: B Natriuretic Peptide: 41 pg/mL (ref 0.0–100.0)

## 2023-11-06 LAB — CBC
HCT: 40.4 % (ref 36.0–46.0)
Hemoglobin: 12.8 g/dL (ref 12.0–15.0)
MCH: 27.2 pg (ref 26.0–34.0)
MCHC: 31.7 g/dL (ref 30.0–36.0)
MCV: 86 fL (ref 80.0–100.0)
Platelets: 533 10*3/uL — ABNORMAL HIGH (ref 150–400)
RBC: 4.7 MIL/uL (ref 3.87–5.11)
RDW: 15.6 % — ABNORMAL HIGH (ref 11.5–15.5)
WBC: 10.6 10*3/uL — ABNORMAL HIGH (ref 4.0–10.5)
nRBC: 0 % (ref 0.0–0.2)

## 2023-11-06 LAB — RESP PANEL BY RT-PCR (RSV, FLU A&B, COVID)  RVPGX2
Influenza A by PCR: NEGATIVE
Influenza B by PCR: NEGATIVE
Resp Syncytial Virus by PCR: NEGATIVE
SARS Coronavirus 2 by RT PCR: NEGATIVE

## 2023-11-06 MED ORDER — DOXYCYCLINE HYCLATE 100 MG PO TABS
100.0000 mg | ORAL_TABLET | Freq: Once | ORAL | Status: AC
  Administered 2023-11-06: 100 mg via ORAL
  Filled 2023-11-06: qty 1

## 2023-11-06 MED ORDER — PREDNISONE 20 MG PO TABS: 60.0000 mg | ORAL_TABLET | Freq: Every day | ORAL | 0 refills | Status: AC

## 2023-11-06 MED ORDER — ALBUTEROL SULFATE (2.5 MG/3ML) 0.083% IN NEBU
5.0000 mg | INHALATION_SOLUTION | Freq: Once | RESPIRATORY_TRACT | Status: AC
  Administered 2023-11-06: 5 mg via RESPIRATORY_TRACT
  Filled 2023-11-06: qty 6

## 2023-11-06 MED ORDER — ALBUTEROL SULFATE (2.5 MG/3ML) 0.083% IN NEBU
2.5000 mg | INHALATION_SOLUTION | Freq: Once | RESPIRATORY_TRACT | Status: AC
  Administered 2023-11-06: 2.5 mg via RESPIRATORY_TRACT
  Filled 2023-11-06: qty 3

## 2023-11-06 MED ORDER — LORAZEPAM 2 MG/ML IJ SOLN
1.0000 mg | Freq: Once | INTRAMUSCULAR | Status: AC
  Administered 2023-11-06: 1 mg via INTRAVENOUS
  Filled 2023-11-06: qty 1

## 2023-11-06 MED ORDER — DOXYCYCLINE HYCLATE 100 MG PO CAPS: 100.0000 mg | ORAL_CAPSULE | Freq: Two times a day (BID) | ORAL | 0 refills | Status: DC

## 2023-11-06 MED ORDER — LORAZEPAM 2 MG/ML IJ SOLN
0.5000 mg | Freq: Once | INTRAMUSCULAR | Status: AC
  Administered 2023-11-06: 0.5 mg via INTRAVENOUS
  Filled 2023-11-06: qty 1

## 2023-11-06 NOTE — ED Notes (Signed)
Patient verbalizes understanding of discharge instructions. Opportunity for questioning and answers were provided. Armband removed by staff, pt discharged from ED. Wheeled out to lobby, placed on [facility], awaiting ride home from friend

## 2023-11-06 NOTE — ED Notes (Signed)
Date and time results received: 11/06/23 2025   Test: VBG pO2 Critical Value: < 31  Name of Provider Notified: Rolly Pancake, MD

## 2023-11-06 NOTE — ED Provider Notes (Signed)
[place] EMERGENCY DEPARTMENT AT [facility] Provider Note   CSN: [id number] Arrival date & time: 11/06/23  1925     History  Chief Complaint  Patient presents with   Shortness of Breath    Casey [name] is a 62 y.o. female.  62 year old female with history of COPD on 2 L nasal cannula, anxiety, and polysubstance use including benzodiazepines and opiates who presents to the emergency department with shortness of breath.  Patient reports for the past 3 days she has been having shortness of breath and cough.  Also is having a headache.  Has had some chills.  No fevers.  Says that she is having back pain from the coughing.  Says that she last used recreational substances in August.  Does have Xanax prescribed to her which she does use.  Denies alcohol or tobacco use.       Home Medications Prior to Admission medications   Medication Sig Start Date End Date Taking? Authorizing Provider  doxycycline (VIBRAMYCIN) 100 MG capsule Take 1 capsule (100 mg total) by mouth 2 (two) times daily. 11/06/23  Yes Rondel Baton, MD  acetaminophen (TYLENOL) 325 MG tablet Take 2 tablets (650 mg total) by mouth every 6 (six) hours as needed for mild pain (or Fever >/= 101). 08/17/23   Shon Hale, MD  albuterol (PROVENTIL) (2.5 MG/3ML) 0.083% nebulizer solution Take 3 mLs (2.5 mg total) by nebulization every 6 (six) hours as needed for wheezing or shortness of breath. 10/15/23   Nyoka Cowden, MD  albuterol (VENTOLIN HFA) 108 (90 Base) MCG/ACT inhaler Inhale 2 puffs into the lungs every 6 (six) hours as needed for wheezing or shortness of breath. 08/17/23   Shon Hale, MD  budesonide-formoterol (SYMBICORT) 160-4.5 MCG/ACT inhaler Take 2 puffs first thing in am and then another 2 puffs about 12 hours later. 09/28/23   Nyoka Cowden, MD  cholecalciferol (VITAMIN D3) 25 MCG (1000 UNIT) tablet Take 1,000 Units by mouth daily.    [provider]  citalopram (CELEXA) 20 MG  tablet Take 1 tablet (20 mg total) by mouth daily. 08/17/23   Shon Hale, MD  dextromethorphan-guaiFENesin (MUCINEX DM) 30-600 MG 12hr tablet Take 1 tablet by mouth 2 (two) times daily. 08/17/23   Shon Hale, MD  famotidine (PEPCID) 20 MG tablet One after supper 10/15/23   Nyoka Cowden, MD  feeding supplement (ENSURE ENLIVE / ENSURE PLUS) LIQD Take 237 mLs by mouth 3 (three) times daily between meals. 08/17/23   Shon Hale, MD  guaiFENesin (ROBITUSSIN) 100 MG/5ML liquid Take 5 mLs by mouth every 4 (four) hours as needed. 08/26/23   Gilmore Laroche, FNP  guaifenesin (ROBITUSSIN) 100 MG/5ML syrup Take 200 mg by mouth 3 (three) times daily as needed for cough.    [provider]  hydrOXYzine (ATARAX) 50 MG tablet Take 1 tablet (50 mg total) by mouth 3 (three) times daily as needed for anxiety. 08/17/23   Shon Hale, MD  pantoprazole (PROTONIX) 40 MG tablet Take 1 tablet (40 mg total) by mouth daily. Take 30-60 min before first meal of the day 10/15/23   Nyoka Cowden, MD  predniSONE (DELTASONE) 20 MG tablet Take 3 tablets (60 mg total) by mouth daily with breakfast for 4 days. Take  4 each am x 2 days,   2 each am x 2 days,  1 each am x 2 days and stop 11/06/23 11/10/23  Rondel Baton, MD  Tiotropium Bromide Monohydrate (SPIRIVA RESPIMAT) 2.5  MCG/ACT AERS Inhale 2 puffs into the lungs daily. 09/28/23   Nyoka Cowden, MD  traZODone (DESYREL) 50 MG tablet Take 1 tablet (50 mg total) by mouth at bedtime as needed for sleep. 08/17/23   Shon Hale, MD  Vitamin D, Ergocalciferol, (DRISDOL) 1.25 MG (50000 UNIT) CAPS capsule Take 1 capsule (50,000 Units total) by mouth every 7 (seven) days. 08/28/23   Gilmore Laroche, FNP      Allergies    Patient has no known allergies.    Review of Systems   Review of Systems  Physical Exam Updated Vital Signs BP 111/79   Pulse (!) 117   Temp 98.3 F (36.8 C) (Oral)   Resp (!) 32   Wt 54.4 kg   LMP  (LMP Unknown)   SpO2  96%   BMI 19.97 kg/m  Physical Exam Vitals and nursing note reviewed.  Constitutional:      General: She is not in acute distress.    Appearance: She is well-developed.  HENT:     Head: Normocephalic and atraumatic.     Right Ear: External ear normal.     Left Ear: External ear normal.     Nose: Nose normal.  Eyes:     Extraocular Movements: Extraocular movements intact.     Conjunctiva/sclera: Conjunctivae normal.     Pupils: Pupils are equal, round, and reactive to light.  Cardiovascular:     Rate and Rhythm: Regular rhythm. Tachycardia present.     Heart sounds: No murmur heard. Pulmonary:     Effort: Pulmonary effort is normal. No respiratory distress.     Breath sounds: Wheezing present.  Musculoskeletal:     Cervical back: Normal range of motion and neck supple.     Right lower leg: No edema.     Left lower leg: No edema.  Skin:    General: Skin is warm and dry.  Neurological:     Mental Status: She is alert and oriented to person, place, and time. Mental status is at baseline.  Psychiatric:        Mood and Affect: Mood normal.     ED Results / Procedures / Treatments   Labs (all labs ordered are listed, but only abnormal results are displayed) Labs Reviewed  COMPREHENSIVE METABOLIC PANEL - Abnormal; Notable for the following components:      Result Value   Potassium 3.1 (*)    Glucose, Bld 205 (*)    BUN 7 (*)    Calcium 8.8 (*)    Albumin 3.4 (*)    AST 12 (*)    All other components within normal limits  CBC - Abnormal; Notable for the following components:   WBC 10.6 (*)    RDW 15.6 (*)    Platelets 533 (*)    All other components within normal limits  BLOOD GAS, VENOUS - Abnormal; Notable for the following components:   pO2, Ven <31 (*)    All other components within normal limits  RESP PANEL BY RT-PCR (RSV, FLU A&B, COVID)  RVPGX2  BRAIN NATRIURETIC PEPTIDE    EKG EKG Interpretation Date/Time:  Saturday November 06 2023 19:34:43  EST Ventricular Rate:  99 PR Interval:  162 QRS Duration:  88 QT Interval:  350 QTC Calculation: 450 R Axis:   40  Text Interpretation: Sinus tachycardia LVH with secondary repolarization abnormality Interpretation limited secondary to artifact Confirmed by Vonita Moss [id number]) on 11/06/2023 9:52:01 PM  Radiology DG Chest Port 1 View Result Date: 11/06/2023  CLINICAL DATA:  Shortness of breath.  Cough. EXAM: PORTABLE CHEST 1 VIEW COMPARISON:  08/14/2023. FINDINGS: Redemonstration of nonspecific opacities throughout bilateral lungs, essentially similar to the prior study favored to represent underlying lung parenchymal fibrosis/scarring. However, there is new faint opacity overlying the right lower lung zone, which may represent superimposed pneumonitis. Correlate clinically. Bilateral costophrenic angles are clear. Normal cardio-mediastinal silhouette. No acute osseous abnormalities. Left shoulder arthroplasty noted. The soft tissues are within normal limits. IMPRESSION: *Redemonstration of nonspecific opacities throughout bilateral lungs, essentially similar to the prior study favored to represent underlying lung parenchymal fibrosis/scarring. However, there is new faint opacity overlying the right lower lung zone, which may represent superimposed pneumonitis. Correlate clinically. Electronically Signed   By: Jules Schick M.D.   On: 11/06/2023 19:59    Procedures Procedures    Medications Ordered in ED Medications  LORazepam (ATIVAN) injection 1 mg (1 mg Intravenous Given 11/06/23 2006)  albuterol (PROVENTIL) (2.5 MG/3ML) 0.083% nebulizer solution 5 mg (5 mg Nebulization Given 11/06/23 1956)  albuterol (PROVENTIL) (2.5 MG/3ML) 0.083% nebulizer solution 2.5 mg (2.5 mg Nebulization Given 11/06/23 2208)  LORazepam (ATIVAN) injection 0.5 mg (0.5 mg Intravenous Given 11/06/23 2212)  doxycycline (VIBRA-TABS) tablet 100 mg (100 mg Oral Given 11/06/23 2251)    ED Course/ Medical Decision  Making/ A&P                                 Medical Decision Making Amount and/or Complexity of Data Reviewed Labs: ordered. Radiology: ordered.  Risk Prescription drug management.   Casey [name] is a 62 y.o. female with comorbidities that complicate the patient evaluation including COPD on 2 L nasal cannula, anxiety, and polysubstance use including benzodiazepines and opiates who presents to the emergency department with shortness of breath.    Initial Ddx:  COPD exacerbation, URI, pneumonia, anxiety  MDM/Course:  Patient presents to the emergency department with shortness of breath and cough.  Also has been having a headache.  On arrival somewhat anxious appearing.  Did just receive multiple doses of albuterol by EMS as well as Solu-Medrol.  Initially was on 4 L nasal cannula.  Able to wean down to 2 L nasal cannula fairly quickly.  Was given some Ativan for anxiety and upon re-evaluation was feeling much better.  She was also given doxycycline in several nebulizer treatments.  ABG without CO2 retention.  BNP WNL and chest x-ray without pulmonary edema or pneumonia bud did show persistent scarring in the L base and possible pneumonitis.  COVID and flu negative.  Did offer admission to the patient but she states that she would strongly prefer to go home at this time.  Does have albuterol at home.  Prescribed her prednisone and doxycycline to take as well.  Recommended that she call 911 should her symptoms worsen in any way.  This patient presents to the ED for concern of complaints listed in HPI, this involves an extensive number of treatment options, and is a complaint that carries with it a high risk of complications and morbidity. Disposition including potential need for admission considered.   Dispo: DC Home. Return precautions discussed including, but not limited to, those listed in the AVS. Allowed pt time to ask questions which were answered fully prior to dc.  Records reviewed  Outpatient Clinic Notes The following labs were independently interpreted: Chemistry and show no acute abnormality I independently reviewed the following imaging with scope of interpretation  limited to determining acute life threatening conditions related to emergency care: Chest x-ray and agree with the radiologist interpretation with the following exceptions: none I personally reviewed and interpreted cardiac monitoring: sinus tachycardia I personally reviewed and interpreted the pt's EKG: see above for interpretation  I have reviewed the patients home medications and made adjustments as needed  Portions of this note were generated with Dragon dictation software. Dictation errors may occur despite best attempts at proofreading.     Final Clinical Impression(s) / ED Diagnoses Final diagnoses:  COPD exacerbation (HCC)  Shortness of breath  Anxiety    Rx / DC Orders ED Discharge Orders          Ordered    doxycycline (VIBRAMYCIN) 100 MG capsule  2 times daily        11/06/23 2241    predniSONE (DELTASONE) 20 MG tablet  Daily with breakfast        11/06/23 2241              Rondel Baton, MD 11/06/23 2328

## 2023-11-06 NOTE — Discharge Instructions (Signed)
You were seen for your COPD exacerbation in the emergency department.   At home, please take the antibiotics that we have prescribed you and the prednisone.  Use your inhalers.  Check your MyChart online for the results of any tests that had not resulted by the time you left the emergency department.   Follow-up with your primary doctor in 2-3 days regarding your visit.    Return immediately to the emergency department if you experience any of the following: Difficulty breathing, or any other concerning symptoms.    Thank you for visiting our Emergency Department. It was a pleasure taking care of you today.

## 2023-11-06 NOTE — ED Triage Notes (Signed)
Pt in from home via [facility] with excessive coughing since this afternoon, unrelieved with home neb trx's. Given 125mg  Solumedrol, 5mg  Albuterol and 0.5mg  Atrovent by EMS. Wears 2LNC at home, bumped to [facility] en route. Hx of COPD, anxious on arrival. Denies any fevers, does report one episode of emesis earlier today

## 2023-11-08 ENCOUNTER — Other Ambulatory Visit: Payer: Self-pay

## 2023-11-08 MED ORDER — SPIRIVA RESPIMAT 2.5 MCG/ACT IN AERS: 2.0000 | INHALATION_SPRAY | Freq: Every day | RESPIRATORY_TRACT | 11 refills | Status: DC

## 2023-11-08 NOTE — Telephone Encounter (Signed)
Called and spoke with patient, regarding refill on medications , informed patient that she has refills on her medications , the Trazodone  Dr Sherene Sires didn't write that for her she will get this for the provider that written this, pt stated that she was dismissed from her pcp office b/c of no show appt. She no longer as a doctor.

## 2023-11-08 NOTE — Telephone Encounter (Signed)
Give her enough of all refills including trazaone but just enough to last until next available slot for ov with me.

## 2023-11-10 ENCOUNTER — Telehealth: Payer: Self-pay | Admitting: Primary Care

## 2023-11-10 NOTE — Progress Notes (Signed)
 Casey [name], female    DOB: [birth year]    MRN: [id number]   Brief patient profile:  63  yowf  Quit smoking  Aug 2024  referred to pulmonary clinic in [place]  09/01/2023 by [facility]  for new dx respiratory failure p apparent asp pna   onset x 2022 difficulty with mowing / with URI> chest dx as recurrent bronchitis always responded to Rx with just abx until Aug 2014:  Date of Admission: 06/25/2023 Admitted from: home     Discharge: Date of discharge: 06/26/23 Disposition:  [name] Condition at discharge: stable   CODE STATUS: FULL CODE       Discharge Physician: Laneta Blunt, DO [facility]     Discharge Diagnoses: Principal Problem:   Aspiration pneumonia (HCC   Depression, recurrent (HCC)   GAD (generalized anxiety disorder)   Acute respiratory failure with hypoxia (HCC)   Polysubstance abuse (HCC)   Acute metabolic encephalopathy             Hospital Course: Casey [name] is a 63 y.o. female with medical history significant of anxiety, chronic pain, depression, suicide attempt, presents the ED with a chief complaint of altered mental status. Per H&P noted CC was shortness of breath, and she is requiring oxygen, but the ED reported that she was brought in for unresponsiveness. When asked the patient herself she says she does not know why she was brought in and she cannot tell me what she remembers prior to being brought into the hospital. Daughter had reported to the ED provider that patient was taking Xanax  and opiates that were not prescribed.  08/16: in evening, to ED, UDS(+) cocaine, THC, benzo. CXR concern for multifocal PNA. Given narcan  which improved alertness and then caused agitation.  08/17: early AM admitted to hospitalist service for aspiration pneumonia Requiring HFNC. Alert this morning. WOB increasing throughout afternoon and CXR appears worse. Reached out to PCCM, trial BiPap here appears improved / less WOB, ABG hypoxic.  D/t high risk for intubation, per Dr. Shelah, recs for transport to [facility] while stable.           ASSESSMENT & PLAN:   Acute metabolic/toxic encephalopathy d/t PNA/Hypoxis as well as polysubstance  Treat underlying causes as below    Aspiration pneumonia (HCC) Right side multi-focal pneumonia Unasyn  started in ED, but patient does have ampicillin  allergy Continue Rocephin  Culture sputum Continue albuterol  as needed Supplemental O2 Bronchodilators prn Worsening this afternoon in terms of WOB, SpO2 93% 7L HFNC repeat CXR --> appears worsening infiltrate ABG pending --> hypoxic BiPap --> normalized reps rate, pt appears more comfortable    Chest pain Tachycardia Suspect costochondritis complicated by GERD Likely component of o9piate withdrawal  High risk for cardiac however  Reproducible on palpation  Troponin pending  CXR EKG prn    Polysubstance abuse (HCC) Likely opiate withdrawal  Positive for benzos, cocaine, and THC Question false negative opiate screen, certain fentanyl  needs special UDS Reportedly taking xanax  and opiates unprescribed PDMP no Rx on file Son states she's taking who knows what street drugs  TOC consult avoid controlled substances when possible   Headache, mild Tylenol , Ibuprofen     Sore throat Cepacol and chloraseptic prn    Acute hypoxic respiratory failure d/t PNA/encephalopathy as above  Supplemental O2 as needed   GAD (generalized anxiety disorder) Depression, recurrent (HCC) Continue celexa  Monitor for withdrawal from benzos - although not prescribed, they are in her urine. The chronicity of her use  is unknown.       History of Present Illness  09/01/2023  Pulmonary/ 1st office eval/ Ibraheem Voris / [facility] maint on  BREO  Chief Complaint  Patient presents with   Establish Care  Dyspnea:  walked across the parking lot to get into building but that's all she's done since last admit.  Cough: green x a week no better p zpak   Sleep: bed is flat with 3 pillows SABA use: last used around 5 h prior to OV   02: 2lpm 24/7  Rec Augmentin  875 mg take one pill twice daily  X 10 days For cough/ congestion > stop robitussin and take mucinex  or mucinex  dm  up to maximum of  1200 mg every 12 hours and use the flutter valve as much as you can   Plan A = Automatic = Always=   Start  Breztri  Take 2 puffs first thing in am and then another 2 puffs about 12 hours later.   Work on inhaler technique:  >>>  Remember how golfer warm up by taking practice swings - do this with an empty inhaler  Plan B = Backup (to supplement plan A, not to replace it) Only use your albuterol  inhaler as a rescue medication  Make sure you check your oxygen saturation  AT  your highest level of activity (not after you stop)   to be sure it stays over 90% Please schedule a follow up office visit in 2 weeks, sooner if needed  with all medications /inhalers/ solutions in hand     10/15/2023  ACUTE  ov/[place] office/Kasumi Ditullio re: AB maint on ? Just one spiriva  did not  bring  all meds / very confused with instructions above and did not follow any correctly /completely  Chief Complaint  Patient presents with   Acute Visit  Dyspnea:  now walking at [employer] where was riding scooter  Cough: worse x 5 d > white or clear  Sleeping: w/in 30 min  of hs starts coughing of flat bed one big pillow   SABA use: twice daily  02: 2lpm 24/ 7  > 90%  but not really titrating  Rec For cough/ congestion > stop robitussin and take  mucinex  dm  up to maximum of  1200 mg every 12 hours and use the flutter valve as much as you can   Pantoprazole  (protonix ) 40 mg   Take  30-60 min before first meal of the day and Pepcid  (famotidine )  20 mg after supper until cough is gone for a week   Plan A = Automatic = Always=    Symbicort  160 x 2 puffs 1st thing in am and chase with spiriva  2 puffs  then 12 hours later Symbicort   Work on inhaler technique:  Plan B = Backup (to supplement  plan A, not to replace it) Only use your albuterol  inhaler as a rescue medication Plan C = Crisis (instead of Plan B but only if Plan B stops working) - only use your albuterol  nebulizer if you first try Plan B  Make sure you check your oxygen saturation  AT  your highest level of activity (not after you stop)   to be sure it stays over 90%  Depomedrol 120 mg IM    11/06/23  to ER dx aecopd  rx doxy / prednisone  pCxr: Redemonstration of nonspecific opacities throughout bilateral lungs, essentially similar to the prior study favored to represent underlying lung parenchymal fibrosis/scarring. However, there is new faint opacity  overlying the right lower lung zone, which may represent superimposed pneumonitis. Correlate clinical   11/11/2023  post ER f/u ov/ office/Shoshannah Faubert re: AB  maint on symbicort  160  - spiriva  broke a week before she flared / did not call for sample  Chief Complaint  Patient presents with   Follow-up    Increased SOB over the past wk. She is coughing- non prod. She completed round of doxy recently. She is using her albuterol  neb about 3 x per day.    Dyspnea:  with minimal acivity  Cough: slt green / getting lighter  on doxy rx  Sleeping: on couch  s  resp cc if sits up   SABA use: last used 5 h prior/ neb one day prior despite present exacerbation  02: 2lpm >  doesn't titrate at all, drops into 80s with activity    No obvious day to day or daytime variability or assoc  mucus plugs or hemoptysis or cp or chest tightness, subjective wheeze or overt sinus or hb symptoms.    Also denies any obvious fluctuation of symptoms with weather or environmental changes or other aggravating or alleviating factors except as outlined above   No unusual exposure hx or h/o childhood pna/ asthma or knowledge of premature birth.  Current Allergies, Complete Past Medical History, Past Surgical History, Family History, and Social History were reviewed in [employer] record.  ROS  The following are not active complaints unless bolded Hoarseness, sore throat, dysphagia, dental problems, itching, sneezing,  nasal congestion or discharge of excess mucus or purulent secretions, ear ache,   fever, chills, sweats, unintended wt loss or wt gain, classically pleuritic or exertional cp,  orthopnea pnd or arm/hand swelling  or leg swelling, presyncope, palpitations, abdominal pain, anorexia, nausea, vomiting, diarrhea  or change in bowel habits or change in bladder habits, change in stools or change in urine, dysuria, hematuria,  rash, arthralgias, visual complaints, headache, numbness, weakness or ataxia or problems with walking or coordination,  change in mood or  memory.        Current Meds  Medication Sig   acetaminophen  (TYLENOL ) 325 MG tablet Take 2 tablets (650 mg total) by mouth every 6 (six) hours as needed for mild pain (or Fever >/= 101).   albuterol  (PROVENTIL ) (2.5 MG/3ML) 0.083% nebulizer solution Take 3 mLs (2.5 mg total) by nebulization every 6 (six) hours as needed for wheezing or shortness of breath.   albuterol  (VENTOLIN  HFA) 108 (90 Base) MCG/ACT inhaler Inhale 2 puffs into the lungs every 6 (six) hours as needed for wheezing or shortness of breath.   budesonide -formoterol  (SYMBICORT ) 160-4.5 MCG/ACT inhaler Take 2 puffs first thing in am and then another 2 puffs about 12 hours later.   cholecalciferol (VITAMIN D3) 25 MCG (1000 UNIT) tablet Take 1,000 Units by mouth daily.   citalopram  (CELEXA ) 20 MG tablet Take 1 tablet (20 mg total) by mouth daily.   famotidine  (PEPCID ) 20 MG tablet One after supper   feeding supplement (ENSURE ENLIVE / ENSURE PLUS) LIQD Take 237 mLs by mouth 3 (three) times daily between meals.   guaiFENesin  (ROBITUSSIN) 100 MG/5ML liquid Take 5 mLs by mouth every 4 (four) hours as needed.   hydrOXYzine  (ATARAX ) 50 MG tablet Take 1 tablet (50 mg total) by mouth 3 (three) times daily as needed for anxiety.   predniSONE   (DELTASONE ) 20 MG tablet Take 20 mg by mouth daily with breakfast.   traZODone  (DESYREL ) 50 MG tablet Take 1  tablet (50 mg total) by mouth at bedtime as needed for sleep.   Vitamin D , Ergocalciferol , (DRISDOL ) 1.25 MG (50000 UNIT) CAPS capsule Take 1 capsule (50,000 Units total) by mouth every 7 (seven) days.           Past Medical History:  Diagnosis Date   Anxiety    Chronic back pain    COPD (chronic obstructive pulmonary disease) (HCC)    Depression    History of panic attacks 09/11/2014   Medical history non-contributory    Pneumonia    Polysubstance abuse (HCC)    History of taking non-prescribed opiates, BZDs; also +cocaine and THC   Ruptured disc, thoracic    Suicide attempt (HCC) 2002      Objective:    Wts  11/11/2023          123  10/15/2023        126  09/28/2023      127   09/01/23 121 lb (54.9 kg)  08/26/23 128 lb 1.3 oz (58.1 kg)  08/14/23 125 lb (56.7 kg)    Vital signs reviewed  11/11/2023  - Note at rest 02 sats  97% on RA   General appearance:  animated amb wf nad   / very congested sounding cough   HEENT : Oropharynx  clear     NECK :  without  apparent JVD/ palpable Nodes/TM    LUNGS: no acc muscle use,  Mild barrel  contour chest wall with bilateral  Distant mid exp wheeze and  without cough on insp or exp maneuvers  and mild  Hyperresonant  to  percussion bilaterally     CV:  RRR  no s3 or murmur or increase in P2, and no edema   ABD:  soft and nontender with pos end  insp Hoover's  in the supine position.  No bruits or organomegaly appreciated   MS:  Nl gait/ ext warm without deformities Or obvious joint restrictions  calf tenderness, cyanosis or clubbing     SKIN: warm and dry without lesions    NEURO:  alert, approp, nl sensorium with  no motor or cerebellar deficits apparent.                 Assessment

## 2023-11-10 NOTE — Telephone Encounter (Signed)
 Patient called on-call service on 11/09/22 at 4:35PM, message stated that patient's oxygen levels keep dropping and needs advice. O2 reported at 94% but goes down to 75-76%.   I attempted to contact patient but no answer. Mail box was full and I was unable to leave message.   Reviewed chart, she was just seen  in ED on 12/28. CXR without pulmonary edema or pneumonia. BNP WNL. She was discharged on doxycycline  and prednisone . Patient has oxygen at home.  Please get patient an apt with Dr. Darlean or APP in the next 5-10 days please   CC: Dr. DARLEAN

## 2023-11-11 ENCOUNTER — Encounter: Payer: Self-pay | Admitting: Internal Medicine

## 2023-11-11 ENCOUNTER — Ambulatory Visit (INDEPENDENT_AMBULATORY_CARE_PROVIDER_SITE_OTHER): Payer: MEDICAID | Admitting: Internal Medicine

## 2023-11-11 VITALS — BP 140/74 | HR 66 | Ht 65.0 in | Wt 123.0 lb

## 2023-11-11 DIAGNOSIS — J4489 Other specified chronic obstructive pulmonary disease: Secondary | ICD-10-CM | POA: Diagnosis not present

## 2023-11-11 DIAGNOSIS — J9601 Acute respiratory failure with hypoxia: Secondary | ICD-10-CM | POA: Diagnosis not present

## 2023-11-11 MED ORDER — SPIRIVA RESPIMAT 2.5 MCG/ACT IN AERS: 2.0000 | INHALATION_SPRAY | Freq: Every day | RESPIRATORY_TRACT | Status: DC

## 2023-11-11 MED ORDER — PANTOPRAZOLE SODIUM 40 MG PO TBEC: 40.0000 mg | DELAYED_RELEASE_TABLET | Freq: Every day | ORAL | 2 refills | Status: DC

## 2023-11-11 MED ORDER — METHYLPREDNISOLONE ACETATE 80 MG/ML IJ SUSP
120.0000 mg | Freq: Once | INTRAMUSCULAR | Status: AC
  Administered 2023-11-11: 120 mg via INTRAMUSCULAR

## 2023-11-11 NOTE — Patient Instructions (Signed)
 For cough/ congestion >   Mucinex  dm  up to maximum of  1200 mg every 12 hours and use the flutter valve as much as you can    Pantoprazole  (protonix ) 40 mg   Take  30-60 min before first meal of the day and Pepcid  (famotidine )  20 mg after supper until cough is gone for a week    Plan A = Automatic = Always=    Symbicort  160 x 2 puffs 1st thing in am and chase with spiriva  2 puffs  then 12 hours later Symbicort    Work on inhaler technique:  relax and gently blow all the way out then take a nice smooth full deep breath back in, triggering the inhaler at same time you start breathing in.  Hold breath in for at least  5 seconds if you can. Blow out symbicort   thru nose. Rinse and gargle with water  when done.  If mouth or throat bother you at all,  try brushing teeth/gums/tongue with arm and hammer toothpaste/ make a slurry and gargle and spit out.  >>>  Remember how golfers warm up by taking practice swings - do this with an empty inhaler    Plan B = Backup (to supplement plan A, not to replace it) Only use your albuterol  inhaler as a rescue medication to be used if you can't catch your breath by resting or doing a relaxed purse lip breathing pattern.  - The less you use it, the better it will work when you need it. - Ok to use the inhaler up to 2 puffs  every 4 hours if you must but call for appointment if use goes up over your usual need - Don't leave home without it !!  (think of it like the spare tire for your car)     Plan C = Crisis (instead of Plan B but only if Plan B stops working) - only use your albuterol  nebulizer if you first try Plan B and it fails to help > ok to use the nebulizer up to every 4 hours but if start needing it regularly call for immediate appointment   Make sure you check your oxygen saturation  AT  your highest level of activity (not after you stop)   to be sure it stays over 90% and adjust  02 flow upward to maintain this level if needed but remember to turn it back  to previous settings when you stop (to conserve your supply).    Pantoprazole  (protonix ) 40 mg   Take  30-60 min before first meal of the day and Pepcid  (famotidine )  20 mg after supper until return to office - this is the best way to tell whether stomach acid is contributing to your problem.    Depomedrol 120 mg IM   Please schedule a follow up office visit in 4 weeks, sooner if needed  with all medications /inhalers/ solutions in hand so we can verify exactly what you are taking. This includes all medications from all doctors and over the counters

## 2023-11-11 NOTE — Telephone Encounter (Signed)
 Spoke to patient. She is currently seeing Dr. Sherene Sires in office.  Will close encounter. Nothing further needed.

## 2023-11-12 NOTE — Assessment & Plan Note (Signed)
 Reports stopped smoking  Aug 2024  - 09/28/2023  After extensive coaching inhaler device,  effectiveness =    60% with hfa and smi  - 09/28/2023 start symbicort  160/spiriva  2.5   samples and f/u 6 weeks  - 10/15/2023  After extensive coaching inhaler device,  effectiveness =    50% at best both smi and hfa > add neb alb q 4h prn  - 11/11/2023  After extensive coaching inhaler device,  effectiveness =    75% with smi > add back spiriva    Already on doxy/ pred with problems with acute bronchitis flare > rx mucinex  dm/ flutter and max gerd rx for now given mild globus sensation and risk for cyclical cough here.  Flared off spiriva  with adherence a major  challlenge  >  Group D (now reclassified as E) in terms of symptom/risk and laba/lama/ICS  therefore appropriate rx at this point >>>  continue symbicort  160 and spiriva  samples and f/u in 4 weeks with all meds in hand using a trust but verify approach to confirm accurate Medication  Reconciliation The principal here is that until we are certain that the  patients are doing what we've asked, it makes no sense to ask them to do more.

## 2023-11-12 NOTE — Assessment & Plan Note (Signed)
 Dx 06/2023 in setting of asp pna/ poor dentition  - 09/01/2023   Walked on 2lpm cont  x  3  lap(s) =  approx 450  ft  @ mod pace, stopped due to end of study  with lowest 02 sats 89% and sob at end plus fatigue   - 09/28/2023   Walked on 2lpm cont  x  2  lap(s) =  approx 300  ft  @ slow pace, stopped due to tired and sob  with lowest 02 sats 89%    Advised again: Make sure you check your oxygen saturation  AT  your highest level of activity (not after you stop)   to be sure it stays over 90% and adjust  02 flow upward to maintain this level if needed but remember to turn it back to previous settings when you stop (to conserve your supply).          Each maintenance medication was reviewed in detail including emphasizing most importantly the difference between maintenance and prns and under what circumstances the prns are to be triggered using an action plan format where appropriate.  Total time for H and P, chart review, counseling, reviewing hfa/smi/neb/ 02/pulse ox and flutter  device(s) and generating customized AVS unique to this office visit / same day charting > 30 min for multiple  refractory respiratory  symptoms

## 2023-11-17 NOTE — Telephone Encounter (Signed)
 Filled NFN

## 2023-11-26 DIAGNOSIS — J939 Pneumothorax, unspecified: Secondary | ICD-10-CM | POA: Insufficient documentation

## 2023-12-13 ENCOUNTER — Ambulatory Visit: Payer: MEDICAID | Admitting: Internal Medicine

## 2023-12-21 DIAGNOSIS — M439 Deforming dorsopathy, unspecified: Secondary | ICD-10-CM | POA: Insufficient documentation

## 2023-12-27 ENCOUNTER — Ambulatory Visit: Payer: MEDICAID | Admitting: Family Medicine

## 2024-01-06 ENCOUNTER — Encounter: Payer: Self-pay | Admitting: Family Medicine

## 2024-01-20 ENCOUNTER — Telehealth: Payer: Self-pay

## 2024-01-20 ENCOUNTER — Other Ambulatory Visit (HOSPITAL_COMMUNITY): Payer: Self-pay

## 2024-01-20 NOTE — Telephone Encounter (Signed)
*  Pulm  Pharmacy Patient Advocate Encounter   Received notification from CoverMyMeds that prior authorization for Budesonide-Formoterol Fumarate 160-4.5MCG/ACT aerosol  is required/requested.   Insurance verification completed.   The patient is insured through [employer] .   Per test claim:  Brand Symbicort is preferred by the insurance.  If suggested medication is appropriate, Please send in a new RX and discontinue this one. If not, please advise as to why it's not appropriate so that we may request a Prior Authorization. Please note, some preferred medications may still require a PA.  If the suggested medications have not been trialed and there are no contraindications to their use, the PA will not be submitted, as it will not be approved.   Per test claim Brand Symbicort $4.00/30 days  *called patient pharmacy and left message to bill for Brand

## 2024-02-02 ENCOUNTER — Telehealth: Payer: Self-pay | Admitting: Family Medicine

## 2024-02-02 NOTE — Telephone Encounter (Signed)
 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 Please advise [phone]  Thank you

## 2024-02-02 NOTE — Telephone Encounter (Signed)
 Attempted call back , lvm for call back.

## 2024-02-10 ENCOUNTER — Ambulatory Visit: Payer: MEDICAID | Admitting: Internal Medicine

## 2024-02-10 ENCOUNTER — Telehealth: Payer: Self-pay | Admitting: Internal Medicine

## 2024-02-10 ENCOUNTER — Encounter: Payer: Self-pay | Admitting: Internal Medicine

## 2024-02-10 VITALS — BP 103/62 | HR 104 | Ht 65.0 in | Wt 127.0 lb

## 2024-02-10 DIAGNOSIS — J4489 Other specified chronic obstructive pulmonary disease: Secondary | ICD-10-CM

## 2024-02-10 DIAGNOSIS — J9611 Chronic respiratory failure with hypoxia: Secondary | ICD-10-CM | POA: Diagnosis not present

## 2024-02-10 DIAGNOSIS — F17210 Nicotine dependence, cigarettes, uncomplicated: Secondary | ICD-10-CM

## 2024-02-10 MED ORDER — IPRATROPIUM-ALBUTEROL 0.5-2.5 (3) MG/3ML IN SOLN
3.0000 mL | Freq: Four times a day (QID) | RESPIRATORY_TRACT | 11 refills | Status: DC | PRN
Start: 2024-02-10 — End: 2024-04-06

## 2024-02-10 NOTE — Assessment & Plan Note (Addendum)
 Counseled re importance of smoking cessation but did not meet time criteria for separate billing

## 2024-02-10 NOTE — Telephone Encounter (Signed)
 Called to discuss scheduling the 6 week follow u p with Dr. Sherene Sires per 02/10/24 AVS---no answer and no voice mail

## 2024-02-10 NOTE — Patient Instructions (Addendum)
 Duoneb per nebulizer up to  four times daily   Oxygen:  2lpm as default and Adjust oxygen during the day to keep the saturation over 90%   The key is to stop smoking completely before smoking completely stops you!  Depomedrol 120 mg IM   Zostrix cream is best option for your ribs (over the counter)  Mucinex dm up to 1200 mg every 12 hours for cough (also over the counter)   Please remember to go to the  x-ray department  @  [facility] when you can return with your oxygen - we will call you with the results when they are available     Call your 02 company for adequate supply of 02 for trips to here and [facility]  Please schedule a follow up office visit in 6 weeks, call sooner if needed with all medications /inhalers/ solutions in hand so we can verify exactly what you are taking. This includes all medications from all doctors and over the counters   If condition worsens in meantime go to ER

## 2024-02-10 NOTE — Assessment & Plan Note (Signed)
 Active smoking  - 09/28/2023  After extensive coaching inhaler device,  effectiveness =    60% with hfa and smi  - 09/28/2023 start symbicort 160/spiriva 2.5   samples and f/u 6 weeks  - 10/15/2023  After extensive coaching inhaler device,  effectiveness =    50% at best both smi and hfa > add neb alb q 4h prn  - 11/11/2023  After extensive coaching inhaler device,  effectiveness =    75% with smi > add back spiriva   Very poor condition a week p d/c from SNF with no maint rx or even enough 02 to get to and from doctor's office and note also has to go to [facility] for ortho f/u so rec  1) duoneb up to 4 x daily  2) f/u cxr when available  3) mucinex dm for cough up to 1200 mg bid 4)Zostrix cream for residual L chest wall pain 5) f/u ortho and PCP as planned
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
## 2024-02-10 NOTE — Progress Notes (Signed)
 Patient presented to clinic with empty O2 tank. Patient placed on clinic O2 tank at 3L.
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
## 2024-02-11 ENCOUNTER — Telehealth: Payer: Self-pay | Admitting: Family Medicine

## 2024-02-11 ENCOUNTER — Ambulatory Visit: Payer: Self-pay | Admitting: Internal Medicine

## 2024-02-11 ENCOUNTER — Telehealth: Payer: Self-pay | Admitting: Internal Medicine

## 2024-02-11 ENCOUNTER — Telehealth: Payer: Self-pay | Admitting: Pulmonary Disease

## 2024-02-11 NOTE — Telephone Encounter (Signed)
 Called patient. She  said her prior pcp gave her this medicine.  Needs a refill.  oxyCODONE-acetaminophen (PERCOCET/ROXICET) 5-325 MG per tablet 1 tablet   Pharmacy   [facility] [phone] - [place], [place] - [address] [place], [place] [address] Phone: [phone]  Fax: [phone]    Copied from CRM [id number]. Topic: General - Other >> Feb 11, 2024 12:59 PM Emylou G wrote: Reason for CRM: Patient called.. said needs refill for Percocet.. says she is out now.. Pls call her back..[phone]

## 2024-02-11 NOTE — Telephone Encounter (Signed)
 RN attempted to call for follow-up but pt did not answer.

## 2024-02-11 NOTE — Telephone Encounter (Signed)
 Has not been seen since Oct and you have never prescribed the oxycodone. Your next available appt is end of June. Do you want her to schedule appt first or give a temp supply or refuse? Pls advise

## 2024-02-11 NOTE — Telephone Encounter (Signed)
 Copied from CRM [id number]. Topic: Clinical - Red Word Triage >> Feb 11, 2024  3:38 PM Casey [name] wrote: Red Word that prompted transfer to Nurse Triage: pt havng severe cough, needing cough medicine. Pt seen yesterday, but did not get any cough medicine  TRIAGE SUMMARY NOTE: Speaking to pt, pt has tight cough, currently having coughing fit, pt confirms more SOB than normal right now, it was very severe this morning, pt confirms struggling to breathe "a little bit." Nurse can hear pt squeaking with each breath, as well as coarse breath sounds, pt confirms sounds are from her breathing, confirms wheezing. Pt confirms that she has been using her albuterol nebulizer 3-4x/day, currently on 3L of oxygen at all times, supposed to be at 2L. Pt taking a breath every couple of words. Advised pt call 911 and get to hospital immediately. Pt exclaims "don't want to go to the hospital, I will hang up and say 'F you,' just help me, miss, please don't send me to the hospital." Pt reporting "the cough is the only thing I need something for." Pt states she is unable to go to appt or UC due to no transportation, no home health assigned. Advised that nurse will attempt calling on-call doc, but do recommend strongly that pt call 911. Attempted to reach on-call pulm doc Dr. Wynona Neat, no answer, LVM for call back to pt, gave pt MRN and phone number. Advised pt that nurse was unable to reach doc, advised hospital at this time, since currently unwilling, advised rescue treatments of albuterol nebulizer 3x 20 min apart, pt starting nebulizer treatment while on phone with nurse. Advised call back if worsening, strongly advised that recommendation is to call 911 especially if any worsening. Pt verbalized understanding. Offered call back in 30 min to check on pt, pt accepts, placed in call back. Please advise.   E2C2 Pulmonary Triage - Initial Assessment Questions "Chief Complaint (e.g., cough, sob, wheezing, fever, chills, sweat or  additional symptoms) *Go to specific symptom protocol after initial questions. Cough tight, coughing fits More SOB than normal right now Wheezing Coughing spells all night, not sleep well for a week, can't get to bathroom in time anymore Squeaking with each breath exhale, rhonchi Struggling to breathe a little bit, don't feel as bad as did this morning Don't want to go to the hospital, I will hang up and say F you Just help me miss please don't send me to the hospital been in hospital 5 months out of the year Don't have a ride to get to appt or UC The cough is the only thing I need something for No home health Taking breath every couple words  "How long have symptoms been present?" Past few days  "Have you used your inhalers/maintenance medication?" Yes If yes, "What medications?" Nebulizer 3-4x/day  If inhaler, ask "How many puffs and how often?" Note: Review instructions on medication in the chart. Not used, don't have one  OXYGEN: "Do you wear supplemental oxygen?" Yes If yes, "How many liters are you supposed to use?" All the time, supposed to be at 2L, been at 3L   "Do you monitor your oxygen levels?" Yes If yes, "What is your reading (oxygen level) today?" 88%, gets up to 92% if calm   "What is your usual oxygen saturation reading?"  (Note: Pulmonary O2 sats should be 90% or greater) 92-94%  Reason for Disposition  SEVERE difficulty breathing (e.g., struggling for each breath, speaks in single words)  Answer Assessment -  Initial Assessment Questions 6. CARDIAC HISTORY: "Do you have any history of heart disease?" (e.g., heart attack, angina, bypass surgery, angioplasty)      significant 7. LUNG HISTORY: "Do you have any history of lung disease?"  (e.g., pulmonary embolus, asthma, emphysema)     significant  Protocols used: Breathing Difficulty-A-AH

## 2024-02-11 NOTE — Telephone Encounter (Signed)
 Patient saw Dr. Sherene Sires yesterday in the [place] office---she is requesting we call her in some cough medicine to [facility]  patient call back is [phone]

## 2024-02-11 NOTE — Telephone Encounter (Signed)
 Called patient  No answer  Mailbox is full and not accepting messages

## 2024-02-11 NOTE — Telephone Encounter (Signed)
 Called to discuss scheduling the 6 weeks follow up as requested by Dr. Sherene Sires on 02/10/24---no answer and vm is full

## 2024-02-13 NOTE — Telephone Encounter (Signed)
 Kindly schedule an appt

## 2024-02-14 ENCOUNTER — Ambulatory Visit: Payer: Self-pay

## 2024-02-14 ENCOUNTER — Other Ambulatory Visit: Payer: Self-pay | Admitting: Family Medicine

## 2024-02-14 NOTE — Telephone Encounter (Signed)
 Appt scheduled 4/14

## 2024-02-14 NOTE — Telephone Encounter (Signed)
 Encouraged to continue taking Percocet that was filled on 02/03/2024. PMDP reviewed.

## 2024-02-14 NOTE — Telephone Encounter (Signed)
 See additional TE dated 02/11/24.

## 2024-02-14 NOTE — Telephone Encounter (Signed)
 Per chart patient also spoke with triage nurse same day. DOD also attempted to reach patient but she did not answer and her vm is full.   Dr. Sherene Sires is not going to prescribe any rx strength cough medication at this time. Patient can use OTC cough medication such as Delsym/Robitussin/Mucinex/Coricidin for her cough symptoms.   We have left multiple messages for patient to return call. Patient needs follow up visit.
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
## 2024-02-14 NOTE — Telephone Encounter (Signed)
 Pt has an upcoming appointment with Dr Sherene Sires. Sending to him as an Financial planner

## 2024-02-15 MED ORDER — METHYLPREDNISOLONE ACETATE 80 MG/ML IJ SUSP
120.0000 mg | Freq: Once | INTRAMUSCULAR | Status: AC
Start: 2024-02-15 — End: 2024-02-10
  Administered 2024-02-10: 120 mg via INTRAMUSCULAR

## 2024-02-15 NOTE — Addendum Note (Signed)
 Addended by: Shelby Dubin on: 02/15/2024 04:20 PM   Modules accepted: Orders

## 2024-02-15 NOTE — Telephone Encounter (Signed)
 Call attempt to inform pt. No answer, sent My Chart message w/ instructions.

## 2024-02-21 ENCOUNTER — Inpatient Hospital Stay: Payer: Self-pay | Admitting: Family Medicine

## 2024-03-02 ENCOUNTER — Telehealth: Payer: Self-pay | Admitting: Family Medicine

## 2024-03-02 ENCOUNTER — Ambulatory Visit: Payer: Self-pay

## 2024-03-02 ENCOUNTER — Inpatient Hospital Stay: Payer: Self-pay | Admitting: Family Medicine

## 2024-03-02 NOTE — Telephone Encounter (Signed)
 Copied from CRM [id number]. Topic: Appointments - Scheduling Inquiry for Clinic >> Mar 02, 2024 12:19 PM Rosamond Comes wrote: Reason for CRM: Patient calling in stating transportation has not picked her up for appt 03/02/24 for 1:00pm. Patient is ready and waiting for ride to pick her up.  Transportation is arranged through [employer], Patient called case worker, Erma Hay phone  [phone] she sets up the transportation for appt.   Patient phone [phone]

## 2024-03-02 NOTE — Telephone Encounter (Signed)
 Copied from CRM [id number]. Topic: Clinical - Red Word Triage >> Mar 02, 2024 12:26 PM Rosamond Comes wrote: Red Word that prompted transfer to Nurse Triage: patient calling in right foot pain, swelling, anxiety is real bad.   Chief Complaint: Anxiety  Symptoms: Increased anxiety and panic attacks Frequency: Constant  Pertinent Negatives: Patient denies suicidal ideations  Disposition: [] ED /[] Urgent Care (no appt availability in office) / [] Appointment(In office/virtual)/ []  [facility]/ [] Home Care/ [] Refused Recommended Disposition /[] [facility]/ []  Follow-up with PCP Additional Notes: Patient reports she has had increased anxiety recently and has been having panic attacks as well. She states that she has constant anxiety but that it has been worsening over the last 3 weeks since she's been home alone. She denies any suicidal ideations at this time. Patient states her anxiety is worse today because she has an appointment in the office but her ride through Medicare/Medicaid has not shown up or contacted her. She states she has tried to contact them and to contact her case worker but has not heard from them yet and she is concerned that if she misses this appointment she will be dropped by the practice. I advised that the office will be made aware of her situation and that she should try to reach out to her ride again. She understood and will try to call them again.   Patient has asked that if she is unable to come in due to her ride that she would like medication prescribed to help with her anxiety and to help with her right foot pain that she was coming to be seen for today. Please reach out to patient if she is unable to come in today to assist.    Reason for Disposition  [1] Symptoms of anxiety or panic attack AND [2] is a chronic symptom (recurrent or ongoing AND present > 4 weeks)  Answer Assessment - Initial Assessment Questions 1. CONCERN: "Did anything happen that  prompted you to call today?"      Increased anxiety  2. ANXIETY SYMPTOMS: "Can you describe how you (your loved one; patient) have been feeling?" (e.g., tense, restless, panicky, anxious, keyed up, overwhelmed, sense of impending doom).      Stressed out 3. ONSET: "How long have you been feeling this way?" (e.g., hours, days, weeks)     Ongoing problem, worse for the last 3 weeks  4. SEVERITY: "How would you rate the level of anxiety?" (e.g., 0 - 10; or mild, moderate, severe).     9/10 5. FUNCTIONAL IMPAIRMENT: "How have these feelings affected your ability to do daily activities?" "Have you had more difficulty than usual doing your normal daily activities?" (e.g., getting better, same, worse; self-care, school, work, interactions)     Yes 6. HISTORY: "Have you felt this way before?" "Have you ever been diagnosed with an anxiety problem in the past?" (e.g., generalized anxiety disorder, panic attacks, PTSD). If Yes, ask: "How was this problem treated?" (e.g., medicines, counseling, etc.)     Yes 7. RISK OF HARM - SUICIDAL IDEATION: "Do you ever have thoughts of hurting or killing yourself?" If Yes, ask:  "Do you have these feelings now?" "Do you have a plan on how you would do this?"     No 8. TREATMENT:  "What has been done so far to treat this anxiety?" (e.g., medicines, relaxation strategies). "What has helped?"     Does not have medication  9. TREATMENT - THERAPIST: "Do you have a  counselor or therapist? Name?"     No 10. POTENTIAL TRIGGERS: "Do you drink caffeinated beverages (e.g., coffee, colas, teas), and how much daily?" "Do you drink alcohol or use any drugs?" "Have you started any new medicines recently?"       Being alone at home 11. PATIENT SUPPORT: "Who is with you now?" "Who do you live with?" "Do you have family or friends who you can talk to?"        Sister but does not talk to her about these things  12. OTHER SYMPTOMS: "Do you have any other symptoms?" (e.g., feeling  depressed, trouble concentrating, trouble sleeping, trouble breathing, palpitations or fast heartbeat, chest pain, sweating, nausea, or diarrhea)       Cough, right foot pain  Protocols used: Anxiety and Panic Attack-A-AH

## 2024-03-03 ENCOUNTER — Telehealth: Payer: Self-pay | Admitting: Family Medicine

## 2024-03-03 NOTE — Telephone Encounter (Signed)
 See other message

## 2024-03-03 NOTE — Telephone Encounter (Signed)
 Attempted to reach pt- mailbox full and pt has not been on MyChart in a year

## 2024-03-03 NOTE — Telephone Encounter (Signed)
 Copied from CRM [id number]. Topic: Clinical - Red Word Triage >> Mar 02, 2024 12:26 PM Rosamond Comes wrote: Red Word that prompted transfer to Nurse Triage: patient calling in right foot pain, swelling, anxiety is real bad. >> Mar 02, 2024  4:37 PM Felizardo Hotter wrote: Pt stated ride did not show up to transport pt to appt today. Pt stated needs medication for foot pain and swelling. Please call pt at [phone]

## 2024-03-03 NOTE — Telephone Encounter (Signed)
 Can she do a virtual with available provider?

## 2024-03-07 ENCOUNTER — Encounter: Payer: Self-pay | Admitting: Family Medicine

## 2024-03-15 ENCOUNTER — Inpatient Hospital Stay: Payer: Self-pay | Admitting: Family Medicine

## 2024-03-15 ENCOUNTER — Ambulatory Visit: Payer: Self-pay

## 2024-03-15 NOTE — Telephone Encounter (Signed)
 Copied from CRM [id number]. Topic: Clinical - Red Word Triage >> Mar 15, 2024  8:52 AM Rosamond Comes wrote: Red Word that prompted transfer to Nurse Triage: patient calling to cancel appt for today, trouble breathing doing breathing treatment  Chief Complaint: sob, states spo2 goes down to 71% Symptoms: cough, sob, light headed Frequency: constant Pertinent Negatives: Patient denies fever Disposition: [x] ED /[] Urgent Care (no appt availability in office) / [] Appointment(In office/virtual)/ []  [facility]/ [] Home Care/ [] Refused Recommended Disposition /[] [place] [facility]/ []  Follow-up with PCP Additional Notes: instructed to go to the ER; cancelled apt for today; pcp office updated.   Reason for Disposition  [1] MODERATE difficulty breathing (e.g., speaks in phrases, SOB even at rest, pulse 100-120) AND [2] NEW-onset or WORSE than normal  Answer Assessment - Initial Assessment Questions 1. RESPIRATORY STATUS: "Describe your breathing?" (e.g., wheezing, shortness of breath, unable to speak, severe coughing)      Short of breath  2. ONSET: "When did this breathing problem begin?"      For one week 3. PATTERN "Does the difficult breathing come and go, or has it been constant since it started?"      constant 4. SEVERITY: "How bad is your breathing?" (e.g., mild, moderate, severe)    - MILD: No SOB at rest, mild SOB with walking, speaks normally in sentences, can lie down, no retractions, pulse < 100.    - MODERATE: SOB at rest, SOB with minimal exertion and prefers to sit, cannot lie down flat, speaks in phrases, mild retractions, audible wheezing, pulse 100-120.    - SEVERE: Very SOB at rest, speaks in single words, struggling to breathe, sitting hunched forward, retractions, pulse > 120      moderate 5. RECURRENT SYMPTOM: "Have you had difficulty breathing before?" If Yes, ask: "When was the last time?" and "What happened that time?"      yes 6. CARDIAC HISTORY: "Do you have any  history of heart disease?" (e.g., heart attack, angina, bypass surgery, angioplasty)      no 7. LUNG HISTORY: "Do you have any history of lung disease?"  (e.g., pulmonary embolus, asthma, emphysema)     copd 8. CAUSE: "What do you think is causing the breathing problem?"      na 9. OTHER SYMPTOMS: "Do you have any other symptoms? (e.g., dizziness, runny nose, cough, chest pain, fever)     Cough, light headed 10. O2 SATURATION MONITOR:  "Do you use an oxygen saturation monitor (pulse oximeter) at home?" If Yes, ask: "What is your reading (oxygen level) today?" "What is your usual oxygen saturation reading?" (e.g., 95%)       O2 at 2l, states 72% but up to 97% 11. PREGNANCY: "Is there any chance you are pregnant?" "When was your last menstrual period?"       na 12. TRAVEL: "Have you traveled out of the country in the last month?" (e.g., travel history, exposures)       no  Protocols used: Breathing Difficulty-A-AH

## 2024-03-17 ENCOUNTER — Other Ambulatory Visit: Payer: Self-pay

## 2024-03-17 ENCOUNTER — Inpatient Hospital Stay (HOSPITAL_COMMUNITY)
Admission: EM | Admit: 2024-03-17 | Discharge: 2024-03-22 | DRG: 193 | Disposition: A | Discharge status: Home / Self Care | Payer: MEDICAID | Attending: Internal Medicine | Admitting: Internal Medicine

## 2024-03-17 ENCOUNTER — Encounter (HOSPITAL_COMMUNITY): Payer: Self-pay

## 2024-03-17 ENCOUNTER — Emergency Department (HOSPITAL_COMMUNITY): Payer: MEDICAID

## 2024-03-17 DIAGNOSIS — D72823 Leukemoid reaction: Secondary | ICD-10-CM | POA: Diagnosis present

## 2024-03-17 DIAGNOSIS — Z8249 Family history of ischemic heart disease and other diseases of the circulatory system: Secondary | ICD-10-CM | POA: Diagnosis not present

## 2024-03-17 DIAGNOSIS — Z8659 Personal history of other mental and behavioral disorders: Secondary | ICD-10-CM | POA: Diagnosis not present

## 2024-03-17 DIAGNOSIS — F1120 Opioid dependence, uncomplicated: Secondary | ICD-10-CM | POA: Diagnosis present

## 2024-03-17 DIAGNOSIS — E43 Unspecified severe protein-calorie malnutrition: Secondary | ICD-10-CM | POA: Diagnosis not present

## 2024-03-17 DIAGNOSIS — F410 Panic disorder [episodic paroxysmal anxiety] without agoraphobia: Secondary | ICD-10-CM | POA: Diagnosis present

## 2024-03-17 DIAGNOSIS — F17210 Nicotine dependence, cigarettes, uncomplicated: Secondary | ICD-10-CM | POA: Diagnosis present

## 2024-03-17 DIAGNOSIS — J189 Pneumonia, unspecified organism: Secondary | ICD-10-CM | POA: Diagnosis not present

## 2024-03-17 DIAGNOSIS — Z9151 Personal history of suicidal behavior: Secondary | ICD-10-CM | POA: Diagnosis not present

## 2024-03-17 DIAGNOSIS — Z720 Tobacco use: Secondary | ICD-10-CM | POA: Diagnosis not present

## 2024-03-17 DIAGNOSIS — F339 Major depressive disorder, recurrent, unspecified: Secondary | ICD-10-CM | POA: Diagnosis not present

## 2024-03-17 DIAGNOSIS — F1320 Sedative, hypnotic or anxiolytic dependence, uncomplicated: Secondary | ICD-10-CM | POA: Diagnosis present

## 2024-03-17 DIAGNOSIS — G8929 Other chronic pain: Secondary | ICD-10-CM | POA: Diagnosis present

## 2024-03-17 DIAGNOSIS — F1910 Other psychoactive substance abuse, uncomplicated: Secondary | ICD-10-CM | POA: Diagnosis present

## 2024-03-17 DIAGNOSIS — J9621 Acute and chronic respiratory failure with hypoxia: Secondary | ICD-10-CM | POA: Diagnosis not present

## 2024-03-17 DIAGNOSIS — J9611 Chronic respiratory failure with hypoxia: Secondary | ICD-10-CM | POA: Diagnosis present

## 2024-03-17 DIAGNOSIS — Z823 Family history of stroke: Secondary | ICD-10-CM

## 2024-03-17 DIAGNOSIS — J440 Chronic obstructive pulmonary disease with acute lower respiratory infection: Secondary | ICD-10-CM | POA: Diagnosis present

## 2024-03-17 DIAGNOSIS — F1220 Cannabis dependence, uncomplicated: Secondary | ICD-10-CM | POA: Diagnosis present

## 2024-03-17 DIAGNOSIS — R0602 Shortness of breath: Secondary | ICD-10-CM | POA: Diagnosis present

## 2024-03-17 DIAGNOSIS — F5101 Primary insomnia: Secondary | ICD-10-CM

## 2024-03-17 DIAGNOSIS — E876 Hypokalemia: Secondary | ICD-10-CM | POA: Diagnosis present

## 2024-03-17 DIAGNOSIS — Z820 Family history of epilepsy and other diseases of the nervous system: Secondary | ICD-10-CM

## 2024-03-17 DIAGNOSIS — F411 Generalized anxiety disorder: Secondary | ICD-10-CM | POA: Diagnosis present

## 2024-03-17 DIAGNOSIS — D72829 Elevated white blood cell count, unspecified: Secondary | ICD-10-CM | POA: Diagnosis present

## 2024-03-17 DIAGNOSIS — F1410 Cocaine abuse, uncomplicated: Secondary | ICD-10-CM | POA: Diagnosis present

## 2024-03-17 DIAGNOSIS — Z818 Family history of other mental and behavioral disorders: Secondary | ICD-10-CM

## 2024-03-17 DIAGNOSIS — J441 Chronic obstructive pulmonary disease with (acute) exacerbation: Secondary | ICD-10-CM | POA: Diagnosis present

## 2024-03-17 DIAGNOSIS — R739 Hyperglycemia, unspecified: Secondary | ICD-10-CM | POA: Diagnosis present

## 2024-03-17 DIAGNOSIS — Z8349 Family history of other endocrine, nutritional and metabolic diseases: Secondary | ICD-10-CM | POA: Diagnosis not present

## 2024-03-17 DIAGNOSIS — T380X5A Adverse effect of glucocorticoids and synthetic analogues, initial encounter: Secondary | ICD-10-CM | POA: Diagnosis present

## 2024-03-17 DIAGNOSIS — Z96612 Presence of left artificial shoulder joint: Secondary | ICD-10-CM | POA: Diagnosis present

## 2024-03-17 DIAGNOSIS — Z1152 Encounter for screening for COVID-19: Secondary | ICD-10-CM

## 2024-03-17 DIAGNOSIS — R7303 Prediabetes: Secondary | ICD-10-CM | POA: Diagnosis present

## 2024-03-17 DIAGNOSIS — F1190 Opioid use, unspecified, uncomplicated: Secondary | ICD-10-CM | POA: Diagnosis present

## 2024-03-17 DIAGNOSIS — Z833 Family history of diabetes mellitus: Secondary | ICD-10-CM | POA: Diagnosis not present

## 2024-03-17 DIAGNOSIS — Z79899 Other long term (current) drug therapy: Secondary | ICD-10-CM | POA: Diagnosis not present

## 2024-03-17 DIAGNOSIS — Z765 Malingerer [conscious simulation]: Secondary | ICD-10-CM

## 2024-03-17 LAB — CBC
HCT: 32.5 % — ABNORMAL LOW (ref 36.0–46.0)
Hemoglobin: 10.6 g/dL — ABNORMAL LOW (ref 12.0–15.0)
MCH: 29.3 pg (ref 26.0–34.0)
MCHC: 32.6 g/dL (ref 30.0–36.0)
MCV: 89.8 fL (ref 80.0–100.0)
Platelets: 329 10*3/uL (ref 150–400)
RBC: 3.62 MIL/uL — ABNORMAL LOW (ref 3.87–5.11)
RDW: 12.4 % (ref 11.5–15.5)
WBC: 8.9 10*3/uL (ref 4.0–10.5)
nRBC: 0 % (ref 0.0–0.2)

## 2024-03-17 LAB — POTASSIUM: Potassium: 3.3 mmol/L — ABNORMAL LOW (ref 3.5–5.1)

## 2024-03-17 LAB — URINALYSIS, ROUTINE W REFLEX MICROSCOPIC
Bilirubin Urine: NEGATIVE
Glucose, UA: NEGATIVE mg/dL
Hgb urine dipstick: NEGATIVE
Ketones, ur: NEGATIVE mg/dL
Leukocytes,Ua: NEGATIVE
Nitrite: NEGATIVE
Protein, ur: NEGATIVE mg/dL
Specific Gravity, Urine: 1.008 (ref 1.005–1.030)
pH: 6 (ref 5.0–8.0)

## 2024-03-17 LAB — COMPREHENSIVE METABOLIC PANEL WITH GFR
ALT: 9 U/L (ref 0–44)
AST: 8 U/L — ABNORMAL LOW (ref 15–41)
Albumin: 3.2 g/dL — ABNORMAL LOW (ref 3.5–5.0)
Alkaline Phosphatase: 90 U/L (ref 38–126)
Anion gap: 10 (ref 5–15)
BUN: 6 mg/dL — ABNORMAL LOW (ref 8–23)
CO2: 29 mmol/L (ref 22–32)
Calcium: 8.7 mg/dL — ABNORMAL LOW (ref 8.9–10.3)
Chloride: 95 mmol/L — ABNORMAL LOW (ref 98–111)
Creatinine, Ser: 0.49 mg/dL (ref 0.44–1.00)
GFR, Estimated: >60 mL/min (ref >60)
Glucose, Bld: 160 mg/dL — ABNORMAL HIGH (ref 70–99)
Potassium: 2.6 mmol/L — CL (ref 3.5–5.1)
Sodium: 134 mmol/L — ABNORMAL LOW (ref 135–145)
Total Bilirubin: 0.5 mg/dL (ref 0.0–1.2)
Total Protein: 6.7 g/dL (ref 6.5–8.1)

## 2024-03-17 LAB — CREATININE, SERUM
Creatinine, Ser: 0.4 mg/dL — ABNORMAL LOW (ref 0.44–1.00)
GFR, Estimated: >60 mL/min (ref >60)

## 2024-03-17 LAB — RESP PANEL BY RT-PCR (RSV, FLU A&B, COVID)  RVPGX2
Influenza A by PCR: NEGATIVE
Influenza B by PCR: NEGATIVE
Resp Syncytial Virus by PCR: NEGATIVE
SARS Coronavirus 2 by RT PCR: NEGATIVE

## 2024-03-17 LAB — LIPASE, BLOOD: Lipase: 23 U/L (ref 11–51)

## 2024-03-17 LAB — CBC WITH DIFFERENTIAL/PLATELET
Abs Immature Granulocytes: 0.03 10*3/uL (ref 0.00–0.07)
Basophils Absolute: 0 10*3/uL (ref 0.0–0.1)
Basophils Relative: 0 %
Eosinophils Absolute: 0.1 10*3/uL (ref 0.0–0.5)
Eosinophils Relative: 1 %
HCT: 35.7 % — ABNORMAL LOW (ref 36.0–46.0)
Hemoglobin: 11.9 g/dL — ABNORMAL LOW (ref 12.0–15.0)
Immature Granulocytes: 0 %
Lymphocytes Relative: 20 %
Lymphs Abs: 2.6 10*3/uL (ref 0.7–4.0)
MCH: 29.8 pg (ref 26.0–34.0)
MCHC: 33.3 g/dL (ref 30.0–36.0)
MCV: 89.3 fL (ref 80.0–100.0)
Monocytes Absolute: 1 10*3/uL (ref 0.1–1.0)
Monocytes Relative: 8 %
Neutro Abs: 9.1 10*3/uL — ABNORMAL HIGH (ref 1.7–7.7)
Neutrophils Relative %: 71 %
Platelets: 399 10*3/uL (ref 150–400)
RBC: 4 MIL/uL (ref 3.87–5.11)
RDW: 12.5 % (ref 11.5–15.5)
WBC: 12.9 10*3/uL — ABNORMAL HIGH (ref 4.0–10.5)
nRBC: 0 % (ref 0.0–0.2)

## 2024-03-17 LAB — BRAIN NATRIURETIC PEPTIDE: B Natriuretic Peptide: 15 pg/mL (ref 0.0–100.0)

## 2024-03-17 LAB — TROPONIN I (HIGH SENSITIVITY)
Troponin I (High Sensitivity): 2 ng/L (ref <18)
Troponin I (High Sensitivity): 3 ng/L (ref <18)

## 2024-03-17 MED ORDER — POTASSIUM CHLORIDE CRYS ER 20 MEQ PO TBCR
20.0000 meq | EXTENDED_RELEASE_TABLET | Freq: Once | ORAL | Status: AC
  Administered 2024-03-17: 20 meq via ORAL
  Filled 2024-03-17: qty 1

## 2024-03-17 MED ORDER — IPRATROPIUM-ALBUTEROL 0.5-2.5 (3) MG/3ML IN SOLN
3.0000 mL | Freq: Once | RESPIRATORY_TRACT | Status: AC
  Administered 2024-03-17: 3 mL via RESPIRATORY_TRACT
  Filled 2024-03-17: qty 3

## 2024-03-17 MED ORDER — SODIUM CHLORIDE 0.9 % IV SOLN
500.0000 mg | Freq: Once | INTRAVENOUS | Status: AC
  Administered 2024-03-17: 500 mg via INTRAVENOUS
  Filled 2024-03-17: qty 5

## 2024-03-17 MED ORDER — POTASSIUM CHLORIDE 10 MEQ/100ML IV SOLN
10.0000 meq | Freq: Once | INTRAVENOUS | Status: AC
  Administered 2024-03-17: 10 meq via INTRAVENOUS
  Filled 2024-03-17: qty 100

## 2024-03-17 MED ORDER — POTASSIUM CHLORIDE CRYS ER 20 MEQ PO TBCR
40.0000 meq | EXTENDED_RELEASE_TABLET | Freq: Once | ORAL | Status: AC
  Administered 2024-03-17: 40 meq via ORAL
  Filled 2024-03-17: qty 2

## 2024-03-17 MED ORDER — ONDANSETRON HCL 4 MG PO TABS: 4.0000 mg | ORAL_TABLET | Freq: Four times a day (QID) | ORAL | Status: DC | PRN

## 2024-03-17 MED ORDER — KETOROLAC TROMETHAMINE 15 MG/ML IJ SOLN
15.0000 mg | Freq: Once | INTRAMUSCULAR | Status: DC
  Filled 2024-03-17: qty 1

## 2024-03-17 MED ORDER — SODIUM CHLORIDE 0.9 % IV SOLN
1.0000 g | INTRAVENOUS | Status: AC
  Administered 2024-03-18 – 2024-03-21 (×4): 1 g via INTRAVENOUS
  Filled 2024-03-17 (×4): qty 10

## 2024-03-17 MED ORDER — MAGNESIUM SULFATE 2 GM/50ML IV SOLN
2.0000 g | Freq: Once | INTRAVENOUS | Status: AC
  Administered 2024-03-17: 2 g via INTRAVENOUS
  Filled 2024-03-17: qty 50

## 2024-03-17 MED ORDER — METHYLPREDNISOLONE SODIUM SUCC 125 MG IJ SOLR
125.0000 mg | Freq: Once | INTRAMUSCULAR | Status: AC
  Administered 2024-03-17: 125 mg via INTRAVENOUS
  Filled 2024-03-17: qty 2

## 2024-03-17 MED ORDER — ENSURE ENLIVE PO LIQD
237.0000 mL | Freq: Three times a day (TID) | ORAL | Status: DC
Start: 2024-03-17 — End: 2024-03-22
  Administered 2024-03-17 – 2024-03-22 (×7): 237 mL via ORAL

## 2024-03-17 MED ORDER — TRAZODONE HCL 50 MG PO TABS
25.0000 mg | ORAL_TABLET | Freq: Every evening | ORAL | Status: DC | PRN
  Administered 2024-03-18 – 2024-03-21 (×4): 25 mg via ORAL
  Filled 2024-03-17 (×4): qty 1

## 2024-03-17 MED ORDER — FENTANYL CITRATE PF 50 MCG/ML IJ SOSY
12.5000 ug | PREFILLED_SYRINGE | INTRAMUSCULAR | Status: DC | PRN
  Administered 2024-03-19: 12.5 ug via INTRAVENOUS
  Filled 2024-03-17: qty 1

## 2024-03-17 MED ORDER — SODIUM CHLORIDE 0.9 % IV SOLN
1.0000 g | Freq: Once | INTRAVENOUS | Status: AC
  Administered 2024-03-17: 1 g via INTRAVENOUS
  Filled 2024-03-17: qty 10

## 2024-03-17 MED ORDER — DEXTROMETHORPHAN POLISTIREX ER 30 MG/5ML PO SUER
30.0000 mg | Freq: Two times a day (BID) | ORAL | Status: DC | PRN
  Administered 2024-03-18 – 2024-03-21 (×3): 30 mg via ORAL
  Filled 2024-03-17 (×5): qty 5

## 2024-03-17 MED ORDER — METHYLPREDNISOLONE SODIUM SUCC 40 MG IJ SOLR
40.0000 mg | Freq: Two times a day (BID) | INTRAMUSCULAR | Status: DC
  Administered 2024-03-18: 40 mg via INTRAVENOUS
  Filled 2024-03-17: qty 1

## 2024-03-17 MED ORDER — LIDOCAINE 5 % EX PTCH
2.0000 | MEDICATED_PATCH | CUTANEOUS | Status: DC
  Administered 2024-03-18 – 2024-03-20 (×3): 2 via TRANSDERMAL
  Filled 2024-03-17 (×7): qty 2

## 2024-03-17 MED ORDER — CITALOPRAM HYDROBROMIDE 20 MG PO TABS
20.0000 mg | ORAL_TABLET | Freq: Every day | ORAL | Status: DC
  Administered 2024-03-18 – 2024-03-22 (×5): 20 mg via ORAL
  Filled 2024-03-17 (×6): qty 1

## 2024-03-17 MED ORDER — LACTATED RINGERS IV SOLN: INTRAVENOUS | Status: DC

## 2024-03-17 MED ORDER — SODIUM CHLORIDE 0.9 % IV SOLN
500.0000 mg | INTRAVENOUS | Status: AC
  Administered 2024-03-18 – 2024-03-21 (×4): 500 mg via INTRAVENOUS
  Filled 2024-03-17 (×4): qty 5

## 2024-03-17 MED ORDER — SODIUM CHLORIDE 0.9 % IV BOLUS
1000.0000 mL | Freq: Once | INTRAVENOUS | Status: AC
  Administered 2024-03-17: 1000 mL via INTRAVENOUS

## 2024-03-17 MED ORDER — HYDROXYZINE HCL 25 MG PO TABS
50.0000 mg | ORAL_TABLET | Freq: Three times a day (TID) | ORAL | Status: DC | PRN
  Administered 2024-03-17 – 2024-03-18 (×2): 50 mg via ORAL
  Filled 2024-03-17 (×2): qty 2

## 2024-03-17 MED ORDER — GUAIFENESIN ER 600 MG PO TB12
600.0000 mg | ORAL_TABLET | Freq: Two times a day (BID) | ORAL | Status: AC
  Administered 2024-03-17 – 2024-03-21 (×10): 600 mg via ORAL
  Filled 2024-03-17 (×10): qty 1

## 2024-03-17 MED ORDER — LIDOCAINE 5 % EX PTCH
1.0000 | MEDICATED_PATCH | Freq: Once | CUTANEOUS | Status: AC
  Administered 2024-03-17: 1 via TRANSDERMAL
  Filled 2024-03-17: qty 1

## 2024-03-17 MED ORDER — ACETAMINOPHEN 325 MG PO TABS: 650.0000 mg | ORAL_TABLET | Freq: Four times a day (QID) | ORAL | Status: DC | PRN

## 2024-03-17 MED ORDER — ACETAMINOPHEN 650 MG RE SUPP: 650.0000 mg | Freq: Four times a day (QID) | RECTAL | Status: DC | PRN

## 2024-03-17 MED ORDER — ENOXAPARIN SODIUM 40 MG/0.4ML IJ SOSY
40.0000 mg | PREFILLED_SYRINGE | INTRAMUSCULAR | Status: DC
  Administered 2024-03-17 – 2024-03-21 (×5): 40 mg via SUBCUTANEOUS
  Filled 2024-03-17 (×5): qty 0.4

## 2024-03-17 MED ORDER — METHOCARBAMOL 500 MG PO TABS
500.0000 mg | ORAL_TABLET | Freq: Three times a day (TID) | ORAL | Status: DC | PRN
  Administered 2024-03-19: 500 mg via ORAL
  Filled 2024-03-17: qty 1

## 2024-03-17 MED ORDER — OXYCODONE HCL 5 MG PO TABS
5.0000 mg | ORAL_TABLET | Freq: Four times a day (QID) | ORAL | Status: DC | PRN
  Administered 2024-03-17 – 2024-03-22 (×15): 5 mg via ORAL
  Filled 2024-03-17 (×15): qty 1

## 2024-03-17 MED ORDER — BISACODYL 5 MG PO TBEC: 5.0000 mg | DELAYED_RELEASE_TABLET | Freq: Every day | ORAL | Status: DC | PRN

## 2024-03-17 MED ORDER — HYDROCODONE-ACETAMINOPHEN 5-325 MG PO TABS
1.0000 | ORAL_TABLET | Freq: Once | ORAL | Status: AC
  Administered 2024-03-17: 1 via ORAL
  Filled 2024-03-17: qty 1

## 2024-03-17 MED ORDER — PROCHLORPERAZINE EDISYLATE 10 MG/2ML IJ SOLN: 10.0000 mg | INTRAMUSCULAR | Status: DC | PRN

## 2024-03-17 MED ORDER — IPRATROPIUM-ALBUTEROL 0.5-2.5 (3) MG/3ML IN SOLN
3.0000 mL | RESPIRATORY_TRACT | Status: DC
  Administered 2024-03-17 (×2): 3 mL via RESPIRATORY_TRACT
  Filled 2024-03-17 (×2): qty 3

## 2024-03-17 MED ORDER — ONDANSETRON HCL 4 MG/2ML IJ SOLN
4.0000 mg | Freq: Four times a day (QID) | INTRAMUSCULAR | Status: DC | PRN
  Administered 2024-03-18: 4 mg via INTRAVENOUS
  Filled 2024-03-17: qty 2

## 2024-03-17 NOTE — Plan of Care (Signed)

## 2024-03-17 NOTE — ED Notes (Signed)
 ED TO INPATIENT HANDOFF REPORT  ED Nurse Name and Phone #: Gaylan Kaufman [phone]  S Name/Age/Gender Casey [name] 63 y.o. female Room/Bed: APA10/APA10  Code Status   Code Status: Full Code  Home/SNF/Other Home Patient oriented to: self, place, time, and situation Is this baseline? Yes   Triage Complete: Triage complete  Chief Complaint Acute and chronic respiratory failure with hypoxia [facility]) [J96.21]  Triage Note Patient brought by [facility] from home for shortness of breath for the past 2 weeks. Patient has a history of COPD and is on 2L of O2 at home. Patient vomited last night from coughing, but denies nausea.    Allergies No Known Allergies  Level of Care/Admitting Diagnosis ED Disposition     ED Disposition  Admit   Condition  --   Comment  Hospital Area: [facility] [[id number]]  Level of Care: Med-Surg [16]  Covid Evaluation: Asymptomatic - no recent exposure (last 10 days) testing not required  Diagnosis: Acute and chronic respiratory failure with hypoxia [facility]) [[id number]]  Admitting Physician: Casey [name] [[id number]]  Attending Physician: Casey [name] [[id number]]  Certification:: I certify this patient will need inpatient services for at least 2 midnights  Expected Medical Readiness: 03/21/2024          B Medical/Surgery History Past Medical History:  Diagnosis Date   Anxiety    Chronic back pain    COPD (chronic obstructive pulmonary disease) (HCC)    Depression    History of panic attacks 09/11/2014   Medical history non-contributory    Pneumonia    Polysubstance abuse (HCC)    History of taking non-prescribed opiates, BZDs; also +cocaine and THC   Ruptured disc, thoracic    Suicide attempt (HCC) 2002   Past Surgical History:  Procedure Laterality Date   CESAREAN SECTION     CLAVICLE SURGERY     REVERSE SHOULDER ARTHROPLASTY Left 01/01/2020   Procedure: LEFT REVERSE SHOULDER ARTHROPLASTY;  Surgeon: Jasmine Mesi,  MD;  Location: [facility] OR;  Service: Orthopedics;  Laterality: Left;   TUBAL LIGATION       A IV Location/Drains/Wounds Patient Lines/Drains/Airways Status     Active Line/Drains/Airways     Name Placement date Placement time Site Days   Peripheral IV 03/17/24 20 G 1" Left Antecubital 03/17/24  0753  Antecubital  less than 1   Peripheral IV 03/17/24 20 G 1" Anterior;Right Forearm 03/17/24  1115  Forearm  less than 1            Intake/Output Last 24 hours  Intake/Output Summary (Last 24 hours) at 03/17/2024 1357 Last data filed at 03/17/2024 1253 Gross per 24 hour  Intake 494.62 ml  Output --  Net 494.62 ml    Labs/Imaging Results for orders placed or performed during the hospital encounter of 03/17/24 (from the past 48 hours)  Brain natriuretic peptide     Status: None   Collection Time: 03/17/24  8:42 AM  Result Value Ref Range   B Natriuretic Peptide 15.0 0.0 - 100.0 pg/mL    Comment: Performed at [facility], [address], [place], [place] [address]  CBC with Differential     Status: Abnormal   Collection Time: 03/17/24  8:42 AM  Result Value Ref Range   WBC 12.9 (H) 4.0 - 10.5 K/uL   RBC 4.00 3.87 - 5.11 MIL/uL   Hemoglobin 11.9 (L) 12.0 - 15.0 g/dL   HCT 72.5 (L) 36.6 - 44.0 %   MCV 89.3 80.0 -  100.0 fL   MCH 29.8 26.0 - 34.0 pg   MCHC 33.3 30.0 - 36.0 g/dL   RDW 16.1 09.6 - 04.5 %   Platelets 399 150 - 400 K/uL   nRBC 0.0 0.0 - 0.2 %   Neutrophils Relative % 71 %   Neutro Abs 9.1 (H) 1.7 - 7.7 K/uL   Lymphocytes Relative 20 %   Lymphs Abs 2.6 0.7 - 4.0 K/uL   Monocytes Relative 8 %   Monocytes Absolute 1.0 0.1 - 1.0 K/uL   Eosinophils Relative 1 %   Eosinophils Absolute 0.1 0.0 - 0.5 K/uL   Basophils Relative 0 %   Basophils Absolute 0.0 0.0 - 0.1 K/uL   Immature Granulocytes 0 %   Abs Immature Granulocytes 0.03 0.00 - 0.07 K/uL    Comment: Performed at [facility], [address], [place], [place] [address]  Troponin I (High Sensitivity)     Status: None    Collection Time: 03/17/24  8:42 AM  Result Value Ref Range   Troponin I (High Sensitivity) 2 <18 ng/L    Comment: (NOTE) Elevated high sensitivity troponin I (hsTnI) values and significant  changes across serial measurements may suggest ACS but many other  chronic and acute conditions are known to elevate hsTnI results.  Refer to the "Links" section for chest pain algorithms and additional  guidance. Performed at [facility], [address], [place], [place] [address]   Comprehensive metabolic panel     Status: Abnormal   Collection Time: 03/17/24  8:42 AM  Result Value Ref Range   Sodium 134 (L) 135 - 145 mmol/L   Potassium 2.6 (LL) 3.5 - 5.1 mmol/L    Comment: CRITICAL RESULT CALLED TO, READ BACK BY AND VERIFIED WITH CHESTER,A AT 9:35AM ON 03/17/24 BY FESTERMAN,C   Chloride 95 (L) 98 - 111 mmol/L   CO2 29 22 - 32 mmol/L   Glucose, Bld 160 (H) 70 - 99 mg/dL    Comment: Glucose reference range applies only to samples taken after fasting for at least 8 hours.   BUN 6 (L) 8 - 23 mg/dL   Creatinine, Ser 8.29 0.44 - 1.00 mg/dL   Calcium  8.7 (L) 8.9 - 10.3 mg/dL   Total Protein 6.7 6.5 - 8.1 g/dL   Albumin 3.2 (L) 3.5 - 5.0 g/dL   AST 8 (L) 15 - 41 U/L   ALT 9 0 - 44 U/L   Alkaline Phosphatase 90 38 - 126 U/L   Total Bilirubin 0.5 0.0 - 1.2 mg/dL   GFR, Estimated >56 >21 mL/min    Comment: (NOTE) Calculated using the CKD-EPI Creatinine Equation (2021)    Anion gap 10 5 - 15    Comment: Performed at [facility], [address], [place], [place] [address]  Lipase, blood     Status: None   Collection Time: 03/17/24  8:42 AM  Result Value Ref Range   Lipase 23 11 - 51 U/L    Comment: Performed at [facility], [address], [place], [place] [address]  Resp panel by RT-PCR (RSV, Flu A&B, Covid) Anterior Nasal Swab     Status: None   Collection Time: 03/17/24  8:43 AM   Specimen: Anterior Nasal Swab  Result Value Ref Range   SARS Coronavirus 2 by RT PCR NEGATIVE NEGATIVE     Comment: (NOTE) SARS-CoV-2 target nucleic acids are NOT DETECTED.  The SARS-CoV-2 RNA is generally detectable in upper respiratory specimens during the acute phase of infection. The  lowest concentration of SARS-CoV-2 viral copies this assay can detect is 138 copies/mL. A negative result does not preclude SARS-Cov-2 infection and should not be used as the sole basis for treatment or other patient management decisions. A negative result may occur with  improper specimen collection/handling, submission of specimen other than nasopharyngeal swab, presence of viral mutation(s) within the areas targeted by this assay, and inadequate number of viral copies(<138 copies/mL). A negative result must be combined with clinical observations, patient history, and epidemiological information. The expected result is Negative.  Fact Sheet for Patients:  BloggerCourse.com  Fact Sheet for Healthcare Providers:  SeriousBroker.it  This test is no t yet approved or cleared by the [place]  FDA and  has been authorized for detection and/or diagnosis of SARS-CoV-2 by FDA under an Emergency Use Authorization (EUA). This EUA will remain  in effect (meaning this test can be used) for the duration of the COVID-19 declaration under Section 564(b)(1) of the Act, 21 U.S.C.section 360bbb-3(b)(1), unless the authorization is terminated  or revoked sooner.       Influenza A by PCR NEGATIVE NEGATIVE   Influenza B by PCR NEGATIVE NEGATIVE    Comment: (NOTE) The Xpert Xpress SARS-CoV-2/FLU/RSV plus assay is intended as an aid in the diagnosis of influenza from Nasopharyngeal swab specimens and should not be used as a sole basis for treatment. Nasal washings and aspirates are unacceptable for Xpert Xpress SARS-CoV-2/FLU/RSV testing.  Fact Sheet for Patients: BloggerCourse.com  Fact Sheet for Healthcare  Providers: SeriousBroker.it  This test is not yet approved or cleared by the [place]  FDA and has been authorized for detection and/or diagnosis of SARS-CoV-2 by FDA under an Emergency Use Authorization (EUA). This EUA will remain in effect (meaning this test can be used) for the duration of the COVID-19 declaration under Section 564(b)(1) of the Act, 21 U.S.C. section 360bbb-3(b)(1), unless the authorization is terminated or revoked.     Resp Syncytial Virus by PCR NEGATIVE NEGATIVE    Comment: (NOTE) Fact Sheet for Patients: BloggerCourse.com  Fact Sheet for Healthcare Providers: SeriousBroker.it  This test is not yet approved or cleared by the [place]  FDA and has been authorized for detection and/or diagnosis of SARS-CoV-2 by FDA under an Emergency Use Authorization (EUA). This EUA will remain in effect (meaning this test can be used) for the duration of the COVID-19 declaration under Section 564(b)(1) of the Act, 21 U.S.C. section 360bbb-3(b)(1), unless the authorization is terminated or revoked.  Performed at [facility], [address], [place], [place] [address]   Troponin I (High Sensitivity)     Status: None   Collection Time: 03/17/24 11:24 AM  Result Value Ref Range   Troponin I (High Sensitivity) 3 <18 ng/L    Comment: (NOTE) Elevated high sensitivity troponin I (hsTnI) values and significant  changes across serial measurements may suggest ACS but many other  chronic and acute conditions are known to elevate hsTnI results.  Refer to the "Links" section for chest pain algorithms and additional  guidance. Performed at [facility], [address], [place], [place] [address]   Potassium     Status: Abnormal   Collection Time: 03/17/24  1:05 PM  Result Value Ref Range   Potassium 3.3 (L) 3.5 - 5.1 mmol/L    Comment: Performed at [facility], [address], [place], [place]  [address]   DG Chest Port 1 View Result Date: 03/17/2024 CLINICAL DATA:  Shortness of breath EXAM: PORTABLE CHEST 1 VIEW COMPARISON:  Chest  radiograph 11/06/2023 FINDINGS: Patient is rotated to the left. Monitoring leads overlie the patient. Stable cardiac and mediastinal contours. Increased heterogeneous opacities left mid and lower lung. Persistent heterogeneous opacities right lung base. Biapical pleural-parenchymal thickening. No pleural effusion or pneumothorax. IMPRESSION: Increased heterogeneous opacities left mid and lower lung may represent infection in the appropriate clinical setting. Followup PA and lateral chest X-ray is recommended in 3-4 weeks following trial of antibiotic therapy to ensure resolution and exclude underlying malignancy. Electronically Signed   By: Jone Neither M.D.   On: 03/17/2024 10:09    Pending Labs Unresulted Labs (From admission, onward)     Start     Ordered   03/24/24 0500  Creatinine, serum  (enoxaparin  (LOVENOX )    CrCl >/= 30 ml/min)  Weekly,   R     Comments: while on enoxaparin  therapy    03/17/24 1344   03/18/24 0500  Basic metabolic panel  Daily,   R      03/17/24 1344   03/18/24 0500  Magnesium   Daily,   R      03/17/24 1344   03/18/24 0500  CBC with Differential/Platelet  Daily,   R      03/17/24 1344   03/17/24 1337  CBC  (enoxaparin  (LOVENOX )    CrCl >/= 30 ml/min)  Once,   R       Comments: Baseline for enoxaparin  therapy IF NOT ALREADY DRAWN.  Notify MD if PLT < 100 K.    03/17/24 1344   03/17/24 1337  Creatinine, serum  (enoxaparin  (LOVENOX )    CrCl >/= 30 ml/min)  Once,   R       Comments: Baseline for enoxaparin  therapy IF NOT ALREADY DRAWN.    03/17/24 1344   03/17/24 1013  Expectorated Sputum Assessment w Gram Stain, Rflx to Resp Cult  Once,   URGENT        03/17/24 1012   03/17/24 0955  Urinalysis, Routine w reflex microscopic -Urine, Clean Catch  Once,   URGENT       Question:  Specimen Source  Answer:  Urine, Clean Catch   03/17/24  0954            Vitals/Pain Today's Vitals   03/17/24 1000 03/17/24 1100 03/17/24 1200 03/17/24 1300  BP: 115/65 107/65 113/77 110/68  Pulse: 78 80 79 86  Resp: 15 16 16 19   Temp:      TempSrc:      SpO2: 92% 93% 93% 96%  Weight:      Height:      PainSc:        Isolation Precautions No active isolations  Medications Medications  ketorolac  (TORADOL ) 15 MG/ML injection 15 mg (15 mg Intravenous Not Given 03/17/24 0932)  lidocaine  (LIDODERM ) 5 % 1 patch (1 patch Transdermal Patch Applied 03/17/24 0926)  ipratropium-albuterol  (DUONEB) 0.5-2.5 (3) MG/3ML nebulizer solution 3 mL (has no administration in time range)  HYDROcodone -acetaminophen  (NORCO/VICODIN) 5-325 MG per tablet 1 tablet (has no administration in time range)  enoxaparin  (LOVENOX ) injection 40 mg (has no administration in time range)  lactated ringers  infusion (has no administration in time range)  acetaminophen  (TYLENOL ) tablet 650 mg (has no administration in time range)    Or  acetaminophen  (TYLENOL ) suppository 650 mg (has no administration in time range)  oxyCODONE  (Oxy IR/ROXICODONE ) immediate release tablet 5 mg (has no administration in time range)  fentaNYL  (SUBLIMAZE ) injection 12.5 mcg (has no administration in time range)  traZODone  (DESYREL ) tablet 25 mg (  has no administration in time range)  bisacodyl  (DULCOLAX) EC tablet 5 mg (has no administration in time range)  ondansetron  (ZOFRAN ) tablet 4 mg (has no administration in time range)    Or  ondansetron  (ZOFRAN ) injection 4 mg (has no administration in time range)  prochlorperazine (COMPAZINE) injection 10 mg (has no administration in time range)  cefTRIAXone  (ROCEPHIN ) 1 g in sodium chloride  0.9 % 100 mL IVPB (has no administration in time range)  azithromycin  (ZITHROMAX ) 500 mg in sodium chloride  0.9 % 250 mL IVPB (has no administration in time range)  methylPREDNISolone  sodium succinate (SOLU-MEDROL ) 40 mg/mL injection 40 mg (has no administration  in time range)  ipratropium-albuterol  (DUONEB) 0.5-2.5 (3) MG/3ML nebulizer solution 3 mL (has no administration in time range)  guaiFENesin  (MUCINEX ) 12 hr tablet 600 mg (has no administration in time range)  dextromethorphan  (DELSYM ) 30 MG/5ML liquid 30 mg (has no administration in time range)  lidocaine  (LIDODERM ) 5 % 2 patch (has no administration in time range)  ipratropium-albuterol  (DUONEB) 0.5-2.5 (3) MG/3ML nebulizer solution 3 mL (3 mLs Nebulization Given 03/17/24 0903)  sodium chloride  0.9 % bolus 1,000 mL (1,000 mLs Intravenous New Bag/Given 03/17/24 0932)  methylPREDNISolone  sodium succinate (SOLU-MEDROL ) 125 mg/2 mL injection 125 mg (125 mg Intravenous Given 03/17/24 0922)  potassium chloride  SA (KLOR-CON  M) CR tablet 40 mEq (40 mEq Oral Given 03/17/24 1011)  potassium chloride  10 mEq in 100 mL IVPB (0 mEq Intravenous Stopped 03/17/24 1139)  magnesium  sulfate IVPB 2 g 50 mL (0 g Intravenous Stopped 03/17/24 1115)  cefTRIAXone  (ROCEPHIN ) 1 g in sodium chloride  0.9 % 100 mL IVPB (0 g Intravenous Stopped 03/17/24 1253)  azithromycin  (ZITHROMAX ) 500 mg in sodium chloride  0.9 % 250 mL IVPB (0 mg Intravenous Stopped 03/17/24 1253)    Mobility walks      R Recommendations: See Admitting Provider Note  Report given to:

## 2024-03-17 NOTE — H&P (Signed)
 History and Physical  [facility]  Casey [name] OZH:[id number] DOB: August 19, 1961 DOA: 03/17/2024  PCP: Zarwolo, Gloria, FNP  Patient coming from: Home  Level of care: Med-Surg  I have personally briefly reviewed patient's old medical records in [facility]  Chief Complaint: SOB  HPI: Casey [name] is a 63 year old female with history of polysubstance abuse, chronic respiratory failure on 2 L/min, COPD, chronic tobacco use, opioid dependence, benzodiazepine dependence, history of cocaine and THC use with history of frequent hospitalizations for pneumonia and decompensated COPD presented to the emergency department today with worsening dyspnea, also complaining of arthralgias, noted in the ED to have a higher than normal oxygen requirement of up to 5 L/min.  No known sick contacts.  Patient denies chest pain.  Patient reports productive cough with whitish sputum.  Patient reports severe DOE.  Patient also reports arthralgias and back pain.  Pt was noted to be hypokalemic and pneumonia noted on CXR with new oxygen requirement noted.  She was started on IV antibiotics and hospital admission was requested.    Past Medical History:  Diagnosis Date   Anxiety    Chronic back pain    COPD (chronic obstructive pulmonary disease) (HCC)    Depression    History of panic attacks 09/11/2014   Medical history non-contributory    Pneumonia    Polysubstance abuse (HCC)    History of taking non-prescribed opiates, BZDs; also +cocaine and THC   Ruptured disc, thoracic    Suicide attempt (HCC) 2002    Past Surgical History:  Procedure Laterality Date   CESAREAN SECTION     CLAVICLE SURGERY     REVERSE SHOULDER ARTHROPLASTY Left 01/01/2020   Procedure: LEFT REVERSE SHOULDER ARTHROPLASTY;  Surgeon: Jasmine Mesi, MD;  Location: [facility] OR;  Service: Orthopedics;  Laterality: Left;   TUBAL LIGATION       reports that she has been smoking cigarettes. She has never used smokeless tobacco. She  reports current alcohol use of about 1.0 standard drink of alcohol per week. She reports that she does not currently use drugs after having used the following drugs: "Crack" cocaine and Benzodiazepines.  No Known Allergies  Family History  Problem Relation Age of Onset   Diabetes Mother    Pulmonary fibrosis Mother    Heart attack Father    Parkinson's disease Sister    Bipolar disorder Sister    Thyroid  disease Sister    Heart attack Brother    Other Son        vascular neucrosis   Pulmonary fibrosis Maternal Grandmother    Diabetes Maternal Grandfather    Stroke Maternal Grandfather    Pneumonia Paternal Grandfather    Cancer Sister        melonoma   Other Brother        back problems, nerve problems, soft bones    Prior to Admission medications   Medication Sig Start Date End Date Taking? Authorizing Provider  acetaminophen  (TYLENOL ) 325 MG tablet Take 2 tablets (650 mg total) by mouth every 6 (six) hours as needed for mild pain (or Fever >/= 101). 08/17/23  Yes Emokpae, Courage, MD  albuterol  (VENTOLIN  HFA) 108 (90 Base) MCG/ACT inhaler Inhale 2 puffs into the lungs every 6 (six) hours as needed for wheezing or shortness of breath. 08/17/23  Yes Emokpae, Courage, MD  citalopram  (CELEXA ) 20 MG tablet Take 1 tablet (20 mg total) by mouth daily. 08/17/23  Yes Colin Dawley, MD  hydrOXYzine  (ATARAX )  50 MG tablet Take 1 tablet (50 mg total) by mouth 3 (three) times daily as needed for anxiety. 08/17/23  Yes Emokpae, Courage, MD  ipratropium-albuterol  (DUONEB) 0.5-2.5 (3) MG/3ML SOLN Take 3 mLs by nebulization every 6 (six) hours as needed. 02/10/24  Yes Diamond Formica, MD  methocarbamol  (ROBAXIN ) 500 MG tablet Take 500 mg by mouth every 8 (eight) hours as needed for muscle spasms.   Yes [provider]  feeding supplement (ENSURE ENLIVE / ENSURE PLUS) LIQD Take 237 mLs by mouth 3 (three) times daily between meals. Patient not taking: Reported on 03/17/2024 08/17/23   Colin Dawley, MD  traZODone  (DESYREL ) 50 MG tablet Take 1 tablet (50 mg total) by mouth at bedtime as needed for sleep. Patient not taking: Reported on 03/17/2024 08/17/23   Colin Dawley, MD    Physical Exam: Vitals:   03/17/24 1200 03/17/24 1300 03/17/24 1442 03/17/24 1528  BP: 113/77 110/68 111/71 112/64  Pulse: 79 86 (!) 102 86  Resp: 16 19 (!) 33 19  Temp:   98.3 F (36.8 C) 98.8 F (37.1 C)  TempSrc:   Oral Oral  SpO2: 93% 96% 92% 97%  Weight:      Height:       Constitutional: NAD, calm, comfortable Eyes: PERRL, lids and conjunctivae normal ENMT: Mucous membranes are moist. Posterior pharynx clear of any exudate or lesions.Normal dentition.  Neck: normal, supple, no masses, no thyromegaly Respiratory: diffuse rales and chest congestion and diffuse expiratory wheezing heard.   Cardiovascular: normal s1, s2 sounds, no murmurs / rubs / gallops. No extremity edema. 2+ pedal pulses. No carotid bruits.  Abdomen: no tenderness, no masses palpated. No hepatosplenomegaly. Bowel sounds positive.  Musculoskeletal: no clubbing / cyanosis. No joint deformity upper and lower extremities. Good ROM, no contractures. Normal muscle tone.  Skin: no rashes, lesions, ulcers. No induration Neurologic: CN 2-12 grossly intact. Sensation intact, DTR normal. Strength 5/5 in all 4.  Psychiatric: Normal judgment and insight. Alert and oriented x 3. Normal mood.   Labs on Admission: I have personally reviewed following labs and imaging studies  CBC: Recent Labs  Lab 03/17/24 0842 03/17/24 1406  WBC 12.9* 8.9  NEUTROABS 9.1*  --   HGB 11.9* 10.6*  HCT 35.7* 32.5*  MCV 89.3 89.8  PLT 399 329   Basic Metabolic Panel: Recent Labs  Lab 03/17/24 0842 03/17/24 1305 03/17/24 1406  NA 134*  --   --   K 2.6* 3.3*  --   CL 95*  --   --   CO2 29  --   --   GLUCOSE 160*  --   --   BUN 6*  --   --   CREATININE 0.49  --  0.40*  CALCIUM  8.7*  --   --    GFR: Estimated Creatinine Clearance: 63.9  mL/min (A) (by C-G formula based on SCr of 0.4 mg/dL (L)). Liver Function Tests: Recent Labs  Lab 03/17/24 0842  AST 8*  ALT 9  ALKPHOS 90  BILITOT 0.5  PROT 6.7  ALBUMIN 3.2*   Recent Labs  Lab 03/17/24 0842  LIPASE 23   No results for input(s): "AMMONIA" in the last 168 hours. Coagulation Profile: No results for input(s): "INR", "PROTIME" in the last 168 hours. Cardiac Enzymes: No results for input(s): "CKTOTAL", "CKMB", "CKMBINDEX", "TROPONINI" in the last 168 hours. BNP (last 3 results) No results for input(s): "PROBNP" in the last 8760 hours. HbA1C: No results for input(s): "HGBA1C" in the  last 72 hours. CBG: No results for input(s): "GLUCAP" in the last 168 hours. Lipid Profile: No results for input(s): "CHOL", "HDL", "LDLCALC", "TRIG", "CHOLHDL", "LDLDIRECT" in the last 72 hours. Thyroid  Function Tests: No results for input(s): "TSH", "T4TOTAL", "FREET4", "T3FREE", "THYROIDAB" in the last 72 hours. Anemia Panel: No results for input(s): "VITAMINB12", "FOLATE", "FERRITIN", "TIBC", "IRON", "RETICCTPCT" in the last 72 hours. Urine analysis:    Component Value Date/Time   COLORURINE STRAW (A) 08/14/2023 1620   APPEARANCEUR CLEAR 08/14/2023 1620   LABSPEC 1.004 (L) 08/14/2023 1620   PHURINE 7.0 08/14/2023 1620   GLUCOSEU 150 (A) 08/14/2023 1620   HGBUR NEGATIVE 08/14/2023 1620   BILIRUBINUR NEGATIVE 08/14/2023 1620   KETONESUR 5 (A) 08/14/2023 1620   PROTEINUR NEGATIVE 08/14/2023 1620   UROBILINOGEN 0.2 05/02/2009 1015   NITRITE NEGATIVE 08/14/2023 1620   LEUKOCYTESUR NEGATIVE 08/14/2023 1620    Radiological Exams on Admission: DG Chest Port 1 View Result Date: 03/17/2024 CLINICAL DATA:  Shortness of breath EXAM: PORTABLE CHEST 1 VIEW COMPARISON:  Chest radiograph 11/06/2023 FINDINGS: Patient is rotated to the left. Monitoring leads overlie the patient. Stable cardiac and mediastinal contours. Increased heterogeneous opacities left mid and lower lung. Persistent  heterogeneous opacities right lung base. Biapical pleural-parenchymal thickening. No pleural effusion or pneumothorax. IMPRESSION: Increased heterogeneous opacities left mid and lower lung may represent infection in the appropriate clinical setting. Followup PA and lateral chest X-ray is recommended in 3-4 weeks following trial of antibiotic therapy to ensure resolution and exclude underlying malignancy. Electronically Signed   By: Jone Neither M.D.   On: 03/17/2024 10:09    EKG: Independently reviewed.   Assessment/Plan Principal Problem:   Acute and chronic respiratory failure with hypoxia (HCC) Active Problems:   History of panic attacks   Benzodiazepine dependence, continuous (HCC)   Depression, recurrent (HCC)   GAD (generalized anxiety disorder)   Polysubstance abuse (HCC)   Severe protein-calorie malnutrition (HCC)   COPD with acute exacerbation (HCC)   Opioid use disorder   Cocaine abuse   Tetrahydrocannabinol (THC) use disorder   Hypokalemia   Drug-seeking behavior   Leukocytosis   Tobacco abuse   Cigarette smoker   Chronic respiratory failure with hypoxia (HCC)   Acute on chronic respiratory failure with hypoxia Acute COPD exacerbation Community Acquired pneumonia  -- Pt will be admitted for IV antibiotics -- IV steroids and bronchodilators ordered around the clock -- supplemental oxygen as ordered  -- pulmonary toilet -- mucinex  600 mg BID  -- delsym  cough syrup  -- flutter valve and incentive spirometry   Hypokalemia -- this is being repleted -- recheck in the morning with Mg  Leukocytosis  -- secondary to pneumonia -- treating as above  Polysubstance abuse Opioid and benzodiazepine dependence -- follow up urine toxicology -- careful with controlled substances  Depression/GAD -- resume home medications   DVT prophylaxis: enoxaparin    Code Status: Full   Family Communication:   Disposition Plan: anticipating home   Consults called:   Admission  status: INP Time spent: 62 mins    Level of care: Med-Surg Faustino Hook MD [facility] How to contact the [facility] Attending or Consulting provider 7A - 7P or covering provider during after hours 7P -7A, for this patient?  Check the care team in [facility] and look for a) attending/consulting [facility] provider listed and b) the [facility] team listed Log into [email or web address] and use [facility]'s universal password to access. If you do not have the password, please contact the hospital operator.  Locate the [facility] provider you are looking for under [facility] and page to a number that you can be directly reached. If you still have difficulty reaching the provider, please page the [facility] (Director on Call) for the Hospitalists listed on amion for assistance.   If 7PM-7AM, please contact night-coverage [email or web address] Password [facility]  03/17/2024, 4:54 PM

## 2024-03-17 NOTE — Hospital Course (Signed)
 63 year old female with history of polysubstance abuse, chronic respiratory failure on 2 L/min, COPD, chronic tobacco use, opioid dependence, benzodiazepine dependence, history of cocaine and THC use with history of frequent hospitalizations for pneumonia and decompensated COPD presented to the emergency department today with worsening dyspnea, also complaining of arthralgias, noted in the ED to have a higher than normal oxygen requirement of up to 5 L/min.  No known sick contacts.  Patient denies chest pain.  Patient reports productive cough with whitish sputum.  Patient reports severe DOE.  Patient also reports arthralgias and back pain.  Pt was noted to be hypokalemic and pneumonia noted on CXR with new oxygen requirement noted.  She was started on IV antibiotics and hospital admission was requested.

## 2024-03-17 NOTE — ED Triage Notes (Signed)
 Patient brought by [facility] from home for shortness of breath for the past 2 weeks. Patient has a history of COPD and is on 2L of O2 at home. Patient vomited last night from coughing, but denies nausea.

## 2024-03-17 NOTE — ED Provider Notes (Signed)
 [place] EMERGENCY DEPARTMENT AT [facility] Provider Note  CSN: [id number] Arrival date & time: 03/17/24 4098  Chief Complaint(s) Shortness of Breath  HPI Casey [name] is a 63 y.o. female with past medical history as below, significant for COPD, polysubstance abuse, thoracic back pain who presents to the ED with complaint of dyspnea  History of COPD on 2 L nasal cannula normally, here with difficulty breathing over the past 2 weeks, increased cough with clear/white sputum.  Increased sputum production.  No fevers or chills. Having body aches. No chest pain, no vomiting or nausea, no sick contacts recent travel.  Reports she has worsening dyspnea with exertion.  Also complaining of back pain, acute on chronic. Arthralgias, fatigue.  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   Pneumothorax on left 11/26/2023   Asthmatic bronchitis , chronic (HCC) 09/28/2023   Encounter for immunization 08/26/2023   Tobacco abuse 08/15/2023   Malnutrition of moderate degree 07/27/2023   Sepsis due to pneumonia (HCC) 07/26/2023   Acute and chronic respiratory failure with hypoxia (HCC) 07/26/2023   Cocaine abuse 07/26/2023   Tetrahydrocannabinol (THC) use disorder 07/26/2023   Hypokalemia 07/26/2023   Hyperglycemia 07/26/2023   Drug-seeking behavior 07/26/2023   Leukocytosis 07/26/2023   Opioid use disorder 07/17/2023   Pneumonia of  right upper lobe due to infectious organism 07/08/2023   Delirium 07/07/2023   Vomiting without nausea 07/07/2023   COPD with acute exacerbation (HCC) 07/06/2023   Normocytic anemia 07/06/2023   Pneumonia of right lower lobe due to infectious organism 07/06/2023   Nausea and vomiting 07/05/2023   Severe protein-calorie malnutrition (HCC) 06/29/2023   Polysubstance abuse (HCC) 06/26/2023   Acute metabolic encephalopathy 06/26/2023   Aspiration pneumonia (HCC) 06/25/2023   Primary insomnia 08/20/2021   Depression, recurrent (HCC) 08/20/2021   GAD (generalized anxiety disorder) 08/20/2021   Elevated blood pressure reading in office without diagnosis of hypertension 08/20/2021   Closed fracture of left proximal humerus 01/01/2020   Fracture of base of fifth metacarpal bone of right hand 10/06/2016   Benzodiazepine dependence, continuous (HCC) 01/09/2016   History of panic attacks 09/11/2014   Malaise 09/03/2014   Fatigue 09/03/2014   Body mass index (BMI) of 20.0-20.9 in adult 09/03/2014   Home Medication(s) Prior to Admission medications   Medication Sig Start Date End Date Taking? Authorizing Provider  acetaminophen  (TYLENOL ) 325 MG tablet Take 2 tablets (650 mg total) by mouth every 6 (six) hours as needed for mild pain (or Fever >/= 101). 08/17/23   Colin Dawley, MD  albuterol  (VENTOLIN  HFA) 108 (90 Base) MCG/ACT inhaler Inhale 2 puffs into the lungs every 6 (six) hours as needed for wheezing or shortness of breath. 08/17/23   Colin Dawley, MD  busPIRone (BUSPAR) 5 MG tablet Take 5 mg by mouth 3 (three) times daily. 01/10/24   [provider]  cholecalciferol (VITAMIN D3) 25 MCG (  1000 UNIT) tablet Take 1,000 Units by mouth daily.    [provider]  citalopram  (CELEXA ) 10 MG tablet Take 10 mg by mouth daily. 02/01/24   [provider]  citalopram  (CELEXA ) 20 MG tablet Take 1 tablet (20 mg total) by mouth daily. 08/17/23   Colin Dawley, MD  famotidine   (PEPCID ) 20 MG tablet One after supper 10/15/23   Wert, Michael B, MD  feeding supplement (ENSURE ENLIVE / ENSURE PLUS) LIQD Take 237 mLs by mouth 3 (three) times daily between meals. 08/17/23   Colin Dawley, MD  gabapentin  (NEURONTIN ) 300 MG capsule Take 300 mg by mouth 3 (three) times daily.    [provider]  hydrOXYzine  (ATARAX ) 50 MG tablet Take 1 tablet (50 mg total) by mouth 3 (three) times daily as needed for anxiety. 08/17/23   Colin Dawley, MD  ipratropium-albuterol  (DUONEB) 0.5-2.5 (3) MG/3ML SOLN Take 3 mLs by nebulization every 6 (six) hours as needed. 02/10/24   Diamond Formica, MD  methocarbamol  (ROBAXIN ) 500 MG tablet Take 500 mg by mouth every 8 (eight) hours as needed.    [provider]  pantoprazole  (PROTONIX ) 40 MG tablet Take 1 tablet (40 mg total) by mouth daily. Take 30-60 min before first meal of the day 11/11/23   Diamond Formica, MD  traZODone  (DESYREL ) 50 MG tablet Take 1 tablet (50 mg total) by mouth at bedtime as needed for sleep. 08/17/23   Emokpae, Courage, MD  Vitamin D , Ergocalciferol , (DRISDOL ) 1.25 MG (50000 UNIT) CAPS capsule Take 1 capsule (50,000 Units total) by mouth every 7 (seven) days. 08/28/23   Zarwolo, Gloria, FNP                                                                                                                                    Past Surgical History Past Surgical History:  Procedure Laterality Date   CESAREAN SECTION     CLAVICLE SURGERY     REVERSE SHOULDER ARTHROPLASTY Left 01/01/2020   Procedure: LEFT REVERSE SHOULDER ARTHROPLASTY;  Surgeon: Jasmine Mesi, MD;  Location: [facility] OR;  Service: Orthopedics;  Laterality: Left;   TUBAL LIGATION     Family History Family History  Problem Relation Age of Onset   Diabetes Mother    Pulmonary fibrosis Mother    Heart attack Father    Parkinson's disease Sister    Bipolar disorder Sister    Thyroid  disease Sister    Heart attack Brother    Other Son         vascular neucrosis   Pulmonary fibrosis Maternal Grandmother    Diabetes Maternal Grandfather    Stroke Maternal Grandfather    Pneumonia Paternal Grandfather    Cancer Sister        melonoma   Other Brother        back problems, nerve problems, soft bones    Social History Social History   Tobacco  Use   Smoking status: Every Day    Current packs/day: 0.25    Types: Cigarettes   Smokeless tobacco: Never  Vaping Use   Vaping status: Never Used  Substance Use Topics   Alcohol use: Yes    Alcohol/week: 1.0 standard drink of alcohol    Types: 1 Cans of beer per week    Comment: occ    Drug use: Not Currently    Types: "Crack" cocaine, Benzodiazepines   Allergies Patient has no known allergies.  Review of Systems A thorough review of systems was obtained and all systems are negative except as noted in the HPI and PMH.   Physical Exam Vital Signs  I have reviewed the triage vital signs BP 110/68   Pulse 86   Temp 99 F (37.2 C) (Oral)   Resp 19   Ht 5\' 5"  (1.651 m)   Wt 59 kg   LMP  (LMP Unknown)   SpO2 96%   BMI 21.63 kg/m  Physical Exam Vitals and nursing note reviewed.  Constitutional:      General: She is not in acute distress.    Appearance: Normal appearance. She is not ill-appearing.  HENT:     Head: Normocephalic and atraumatic.     Right Ear: External ear normal.     Left Ear: External ear normal.     Nose: Nose normal.     Mouth/Throat:     Mouth: Mucous membranes are moist.  Eyes:     General: No scleral icterus.       Right eye: No discharge.        Left eye: No discharge.  Cardiovascular:     Rate and Rhythm: Normal rate and regular rhythm.     Pulses: Normal pulses.     Heart sounds: Normal heart sounds.  Pulmonary:     Effort: Pulmonary effort is normal. Tachypnea present. No respiratory distress.     Breath sounds: No stridor. Decreased breath sounds and wheezing present.  Abdominal:     General: Abdomen is flat. There is no  distension.     Palpations: Abdomen is soft.     Tenderness: There is no abdominal tenderness.  Musculoskeletal:     Cervical back: No rigidity.     Right lower leg: No edema.     Left lower leg: No edema.  Skin:    General: Skin is warm and dry.     Capillary Refill: Capillary refill takes less than 2 seconds.  Neurological:     Mental Status: She is alert.  Psychiatric:        Mood and Affect: Mood normal.        Behavior: Behavior normal. Behavior is cooperative.     ED Results and Treatments Labs (all labs ordered are listed, but only abnormal results are displayed) Labs Reviewed  CBC WITH DIFFERENTIAL/PLATELET - Abnormal; Notable for the following components:      Result Value   WBC 12.9 (*)    Hemoglobin 11.9 (*)    HCT 35.7 (*)    Neutro Abs 9.1 (*)    All other components within normal limits  COMPREHENSIVE METABOLIC PANEL WITH GFR - Abnormal; Notable for the following components:   Sodium 134 (*)    Potassium 2.6 (*)    Chloride 95 (*)    Glucose, Bld 160 (*)    BUN 6 (*)    Calcium  8.7 (*)    Albumin 3.2 (*)    AST 8 (*)  All other components within normal limits  POTASSIUM - Abnormal; Notable for the following components:   Potassium 3.3 (*)    All other components within normal limits  RESP PANEL BY RT-PCR (RSV, FLU A&B, COVID)  RVPGX2  EXPECTORATED SPUTUM ASSESSMENT W GRAM STAIN, RFLX TO RESP C  BRAIN NATRIURETIC PEPTIDE  LIPASE, BLOOD  URINALYSIS, ROUTINE W REFLEX MICROSCOPIC  TROPONIN I (HIGH SENSITIVITY)  TROPONIN I (HIGH SENSITIVITY)                                                                                                                          Radiology DG Chest Port 1 View Result Date: 03/17/2024 CLINICAL DATA:  Shortness of breath EXAM: PORTABLE CHEST 1 VIEW COMPARISON:  Chest radiograph 11/06/2023 FINDINGS: Patient is rotated to the left. Monitoring leads overlie the patient. Stable cardiac and mediastinal contours. Increased  heterogeneous opacities left mid and lower lung. Persistent heterogeneous opacities right lung base. Biapical pleural-parenchymal thickening. No pleural effusion or pneumothorax. IMPRESSION: Increased heterogeneous opacities left mid and lower lung may represent infection in the appropriate clinical setting. Followup PA and lateral chest X-ray is recommended in 3-4 weeks following trial of antibiotic therapy to ensure resolution and exclude underlying malignancy. Electronically Signed   By: Jone Neither M.D.   On: 03/17/2024 10:09    Pertinent labs & imaging results that were available during my care of the patient were reviewed by me and considered in my medical decision making (see MDM for details).  Medications Ordered in ED Medications  ketorolac  (TORADOL ) 15 MG/ML injection 15 mg (15 mg Intravenous Not Given 03/17/24 0932)  lidocaine  (LIDODERM ) 5 % 1 patch (1 patch Transdermal Patch Applied 03/17/24 0926)  ipratropium-albuterol  (DUONEB) 0.5-2.5 (3) MG/3ML nebulizer solution 3 mL (has no administration in time range)  HYDROcodone -acetaminophen  (NORCO/VICODIN) 5-325 MG per tablet 1 tablet (has no administration in time range)  ipratropium-albuterol  (DUONEB) 0.5-2.5 (3) MG/3ML nebulizer solution 3 mL (3 mLs Nebulization Given 03/17/24 0903)  sodium chloride  0.9 % bolus 1,000 mL (1,000 mLs Intravenous New Bag/Given 03/17/24 0932)  methylPREDNISolone  sodium succinate (SOLU-MEDROL ) 125 mg/2 mL injection 125 mg (125 mg Intravenous Given 03/17/24 0922)  potassium chloride  SA (KLOR-CON  M) CR tablet 40 mEq (40 mEq Oral Given 03/17/24 1011)  potassium chloride  10 mEq in 100 mL IVPB (0 mEq Intravenous Stopped 03/17/24 1139)  magnesium  sulfate IVPB 2 g 50 mL (0 g Intravenous Stopped 03/17/24 1115)  cefTRIAXone  (ROCEPHIN ) 1 g in sodium chloride  0.9 % 100 mL IVPB (1 g Intravenous New Bag/Given 03/17/24 1152)  azithromycin  (ZITHROMAX ) 500 mg in sodium chloride  0.9 % 250 mL IVPB (500 mg Intravenous New Bag/Given 03/17/24 1146)  Procedures .Critical Care  Performed by: Teddi Favors, DO Authorized by: Teddi Favors, DO   Critical care provider statement:    Critical care time (minutes):  30   Critical care time was exclusive of:  Separately billable procedures and treating other patients   Critical care was necessary to treat or prevent imminent or life-threatening deterioration of the following conditions:  Metabolic crisis   Critical care was time spent personally by me on the following activities:  Development of treatment plan with patient or surrogate, discussions with consultants, evaluation of patient's response to treatment, examination of patient, ordering and review of laboratory studies, ordering and review of radiographic studies, ordering and performing treatments and interventions, pulse oximetry, re-evaluation of patient's condition, review of old charts and obtaining history from patient or surrogate   Care discussed with: admitting provider     (including critical care time)  Medical Decision Making / ED Course    Medical Decision Making:    CHAMIQUE MATUSEK is a 63 y.o. female  with past medical history as below, significant for COPD, polysubstance abuse, thoracic back pain who presents to the ED with complaint of dyspnea . The complaint involves an extensive differential diagnosis and also carries with it a high risk of complications and morbidity.  Serious etiology was considered. Ddx includes but is not limited to:In my evaluation of this patient's dyspnea my DDx includes, but is not limited to, pneumonia, pulmonary embolism, pneumothorax, pulmonary edema, metabolic acidosis, asthma, COPD, cardiac cause, anemia, anxiety, etc.    Complete initial physical exam performed, notably the patient was in no distress.    Reviewed and confirmed nursing documentation for past  medical history, family history, social history.  Vital signs reviewed.     Brief summary:  63 yo female w/ hx as above here with dyspnea Trace wheezing noted on exam, she has mild tachypnea Check screening labs, get x-ray, give nebulized breathing treatment steroids.  She is requesting medicine for back pain, give Toradol  and lidocaine  patch.  History of polysubstance use noted.  Reports her back pain is unchanged from her chronic pain.  No numbness or weakness to extremities.  No fevers.   Clinical Course as of 03/17/24 1334  Fri Mar 17, 2024  0935 Potassium(!!): 2.6 replace [SG]  1012 CXR concerning for PNA, she is now on 3L Meta to keep sat >94%, increased cough and dyspnea, arthralgias. Will treat for PNA. PSI score is 54.  [SG]  1308 Pt attempted to ambulate, sig tachypnea, pulse ox <85% on 5L. Unable to ambulate at her typical level despite increased O2 [SG]    Clinical Course User Index [SG] Teddi Favors, DO     Plan admit for hypokalemia, cap, likely copd exacerbation, pt agreeable Admit TRH             Additional history obtained: -Additional history obtained from na -External records from outside source obtained and reviewed including: Chart review including previous notes, labs, imaging, consultation notes including  Home Medications, primary care documentation  Lab Tests: -I ordered, reviewed, and interpreted labs.   The pertinent results include:   Labs Reviewed  CBC WITH DIFFERENTIAL/PLATELET - Abnormal; Notable for the following components:      Result Value   WBC 12.9 (*)    Hemoglobin 11.9 (*)    HCT 35.7 (*)    Neutro Abs 9.1 (*)    All other components within normal limits  COMPREHENSIVE METABOLIC PANEL WITH GFR - Abnormal; Notable  for the following components:   Sodium 134 (*)    Potassium 2.6 (*)    Chloride 95 (*)    Glucose, Bld 160 (*)    BUN 6 (*)    Calcium  8.7 (*)    Albumin 3.2 (*)    AST 8 (*)    All other components within  normal limits  POTASSIUM - Abnormal; Notable for the following components:   Potassium 3.3 (*)    All other components within normal limits  RESP PANEL BY RT-PCR (RSV, FLU A&B, COVID)  RVPGX2  EXPECTORATED SPUTUM ASSESSMENT W GRAM STAIN, RFLX TO RESP C  BRAIN NATRIURETIC PEPTIDE  LIPASE, BLOOD  URINALYSIS, ROUTINE W REFLEX MICROSCOPIC  TROPONIN I (HIGH SENSITIVITY)  TROPONIN I (HIGH SENSITIVITY)    Notable for hypoK  EKG   EKG Interpretation Date/Time:  Friday Mar 17 2024 08:23:30 EDT Ventricular Rate:  82 PR Interval:  175 QRS Duration:  96 QT Interval:  375 QTC Calculation: 438 R Axis:   17  Text Interpretation: Sinus rhythm no stemi similar to prior Confirmed by Russella Courts ([id number]) on 03/17/2024 8:44:12 AM         Imaging Studies ordered: I ordered imaging studies including CXR I independently visualized the following imaging with scope of interpretation limited to determining acute life threatening conditions related to emergency care; findings noted above I agree with the radiologist interpretation If any imaging was obtained with contrast I closely monitored patient for any possible adverse reaction a/w contrast administration in the emergency department   Medicines ordered and prescription drug management: Meds ordered this encounter  Medications   ipratropium-albuterol  (DUONEB) 0.5-2.5 (3) MG/3ML nebulizer solution 3 mL   ketorolac  (TORADOL ) 15 MG/ML injection 15 mg   lidocaine  (LIDODERM ) 5 % 1 patch   sodium chloride  0.9 % bolus 1,000 mL   methylPREDNISolone  sodium succinate (SOLU-MEDROL ) 125 mg/2 mL injection 125 mg    IV methylprednisolone  will be converted to either a q12h or q24h frequency with the same total daily dose (TDD).  Ordered Dose: 1 to 125 mg TDD; convert to: TDD q24h.  Ordered Dose: 126 to 250 mg TDD; convert to: TDD div q12h.  Ordered Dose: >250 mg TDD; DAW.   potassium chloride  SA (KLOR-CON  M) CR tablet 40 mEq   potassium chloride  10 mEq in  100 mL IVPB   magnesium  sulfate IVPB 2 g 50 mL   cefTRIAXone  (ROCEPHIN ) 1 g in sodium chloride  0.9 % 100 mL IVPB    Antibiotic Indication::   CAP   azithromycin  (ZITHROMAX ) 500 mg in sodium chloride  0.9 % 250 mL IVPB    Antibiotic Indication::   CAP   ipratropium-albuterol  (DUONEB) 0.5-2.5 (3) MG/3ML nebulizer solution 3 mL   HYDROcodone -acetaminophen  (NORCO/VICODIN) 5-325 MG per tablet 1 tablet    Refill:  0    -I have reviewed the patients home medicines and have made adjustments as needed   Consultations Obtained: I requested consultation with the hospitalist,  and discussed lab and imaging findings as well as pertinent plan    Cardiac Monitoring: The patient was maintained on a cardiac monitor.  I personally viewed and interpreted the cardiac monitored which showed an underlying rhythm of: nsr Continuous pulse oximetry interpreted by myself, 93% on 3L.    Social Determinants of Health:  Diagnosis or treatment significantly limited by social determinants of health: current smoker   Reevaluation: After the interventions noted above, I reevaluated the patient and found that they have improved  Co morbidities that  complicate the patient evaluation  Past Medical History:  Diagnosis Date   Anxiety    Chronic back pain    COPD (chronic obstructive pulmonary disease) (HCC)    Depression    History of panic attacks 09/11/2014   Medical history non-contributory    Pneumonia    Polysubstance abuse (HCC)    History of taking non-prescribed opiates, BZDs; also +cocaine and THC   Ruptured disc, thoracic    Suicide attempt [facility]) 2002      Dispostion: Disposition decision including need for hospitalization was considered, and patient admitted to the hospital.    Final Clinical Impression(s) / ED Diagnoses Final diagnoses:  Community acquired pneumonia, unspecified laterality  Hypokalemia  COPD exacerbation (HCC)        Annaleigh, [place], DO 03/17/24 1334

## 2024-03-18 ENCOUNTER — Inpatient Hospital Stay (HOSPITAL_COMMUNITY): Payer: MEDICAID

## 2024-03-18 DIAGNOSIS — Z8659 Personal history of other mental and behavioral disorders: Secondary | ICD-10-CM | POA: Diagnosis not present

## 2024-03-18 DIAGNOSIS — J189 Pneumonia, unspecified organism: Secondary | ICD-10-CM | POA: Diagnosis not present

## 2024-03-18 DIAGNOSIS — D72829 Elevated white blood cell count, unspecified: Secondary | ICD-10-CM

## 2024-03-18 DIAGNOSIS — F411 Generalized anxiety disorder: Secondary | ICD-10-CM | POA: Diagnosis not present

## 2024-03-18 DIAGNOSIS — F1320 Sedative, hypnotic or anxiolytic dependence, uncomplicated: Secondary | ICD-10-CM

## 2024-03-18 DIAGNOSIS — J9621 Acute and chronic respiratory failure with hypoxia: Secondary | ICD-10-CM | POA: Diagnosis not present

## 2024-03-18 DIAGNOSIS — F339 Major depressive disorder, recurrent, unspecified: Secondary | ICD-10-CM

## 2024-03-18 DIAGNOSIS — E43 Unspecified severe protein-calorie malnutrition: Secondary | ICD-10-CM

## 2024-03-18 LAB — BASIC METABOLIC PANEL WITH GFR
Anion gap: 7 (ref 5–15)
BUN: 10 mg/dL (ref 8–23)
CO2: 28 mmol/L (ref 22–32)
Calcium: 8.5 mg/dL — ABNORMAL LOW (ref 8.9–10.3)
Chloride: 104 mmol/L (ref 98–111)
Creatinine, Ser: 0.39 mg/dL — ABNORMAL LOW (ref 0.44–1.00)
GFR, Estimated: >60 mL/min (ref >60)
Glucose, Bld: 114 mg/dL — ABNORMAL HIGH (ref 70–99)
Potassium: 3.9 mmol/L (ref 3.5–5.1)
Sodium: 139 mmol/L (ref 135–145)

## 2024-03-18 LAB — CBC WITH DIFFERENTIAL/PLATELET
Abs Immature Granulocytes: 0.05 10*3/uL (ref 0.00–0.07)
Basophils Absolute: 0 10*3/uL (ref 0.0–0.1)
Basophils Relative: 0 %
Eosinophils Absolute: 0 10*3/uL (ref 0.0–0.5)
Eosinophils Relative: 0 %
HCT: 32.2 % — ABNORMAL LOW (ref 36.0–46.0)
Hemoglobin: 10.7 g/dL — ABNORMAL LOW (ref 12.0–15.0)
Immature Granulocytes: 0 %
Lymphocytes Relative: 19 %
Lymphs Abs: 2.3 10*3/uL (ref 0.7–4.0)
MCH: 30.1 pg (ref 26.0–34.0)
MCHC: 33.2 g/dL (ref 30.0–36.0)
MCV: 90.4 fL (ref 80.0–100.0)
Monocytes Absolute: 1.3 10*3/uL — ABNORMAL HIGH (ref 0.1–1.0)
Monocytes Relative: 11 %
Neutro Abs: 8.2 10*3/uL — ABNORMAL HIGH (ref 1.7–7.7)
Neutrophils Relative %: 70 %
Platelets: 346 10*3/uL (ref 150–400)
RBC: 3.56 MIL/uL — ABNORMAL LOW (ref 3.87–5.11)
RDW: 12.3 % (ref 11.5–15.5)
WBC: 11.9 10*3/uL — ABNORMAL HIGH (ref 4.0–10.5)
nRBC: 0 % (ref 0.0–0.2)

## 2024-03-18 LAB — BLOOD GAS, ARTERIAL
Acid-Base Excess: 3.4 mmol/L — ABNORMAL HIGH (ref 0.0–2.0)
Bicarbonate: 30.4 mmol/L — ABNORMAL HIGH (ref 20.0–28.0)
Drawn by: 22179
O2 Saturation: 82.7 %
Patient temperature: 36.4
pCO2 arterial: 54 mmHg — ABNORMAL HIGH (ref 32–48)
pH, Arterial: 7.36 (ref 7.35–7.45)
pO2, Arterial: 47 mmHg — ABNORMAL LOW (ref 83–108)

## 2024-03-18 LAB — MRSA NEXT GEN BY PCR, NASAL: MRSA by PCR Next Gen: NOT DETECTED

## 2024-03-18 LAB — D-DIMER, QUANTITATIVE: D-Dimer, Quant: <0.27 ug{FEU}/mL (ref 0.00–0.50)

## 2024-03-18 LAB — MAGNESIUM: Magnesium: 1.9 mg/dL (ref 1.7–2.4)

## 2024-03-18 MED ORDER — LORAZEPAM 2 MG/ML IJ SOLN
1.0000 mg | Freq: Four times a day (QID) | INTRAMUSCULAR | Status: DC | PRN
  Administered 2024-03-18 – 2024-03-19 (×2): 1 mg via INTRAVENOUS
  Filled 2024-03-18 (×2): qty 1

## 2024-03-18 MED ORDER — CHLORHEXIDINE GLUCONATE CLOTH 2 % EX PADS: 6.0000 | MEDICATED_PAD | Freq: Every day | CUTANEOUS | Status: DC

## 2024-03-18 MED ORDER — PREDNISONE 20 MG PO TABS: 20.0000 mg | ORAL_TABLET | Freq: Every day | ORAL | Status: DC

## 2024-03-18 MED ORDER — PANTOPRAZOLE SODIUM 40 MG PO TBEC
40.0000 mg | DELAYED_RELEASE_TABLET | Freq: Every evening | ORAL | Status: DC
  Administered 2024-03-18 – 2024-03-21 (×4): 40 mg via ORAL
  Filled 2024-03-18 (×4): qty 1

## 2024-03-18 MED ORDER — IPRATROPIUM-ALBUTEROL 0.5-2.5 (3) MG/3ML IN SOLN
3.0000 mL | RESPIRATORY_TRACT | Status: DC
  Administered 2024-03-18 – 2024-03-20 (×11): 3 mL via RESPIRATORY_TRACT
  Filled 2024-03-18 (×11): qty 3

## 2024-03-18 MED ORDER — CLONAZEPAM 0.25 MG PO TBDP
0.2500 mg | ORAL_TABLET | Freq: Three times a day (TID) | ORAL | Status: DC | PRN
  Administered 2024-03-18: 0.25 mg via ORAL
  Filled 2024-03-18: qty 1

## 2024-03-18 MED ORDER — METHYLPREDNISOLONE SODIUM SUCC 125 MG IJ SOLR
60.0000 mg | Freq: Two times a day (BID) | INTRAMUSCULAR | Status: DC
  Administered 2024-03-18 – 2024-03-22 (×8): 60 mg via INTRAVENOUS
  Filled 2024-03-18 (×8): qty 2

## 2024-03-18 MED ORDER — MAGNESIUM SULFATE 2 GM/50ML IV SOLN
2.0000 g | Freq: Once | INTRAVENOUS | Status: AC
  Administered 2024-03-18: 2 g via INTRAVENOUS
  Filled 2024-03-18: qty 50

## 2024-03-18 MED ORDER — CHLORHEXIDINE GLUCONATE CLOTH 2 % EX PADS
6.0000 | MEDICATED_PAD | Freq: Every day | CUTANEOUS | Status: DC
  Administered 2024-03-19 – 2024-03-22 (×4): 6 via TOPICAL

## 2024-03-18 MED ORDER — FUROSEMIDE 10 MG/ML IJ SOLN
30.0000 mg | Freq: Once | INTRAMUSCULAR | Status: AC
  Administered 2024-03-18: 30 mg via INTRAVENOUS
  Filled 2024-03-18: qty 4

## 2024-03-18 MED ORDER — ALPRAZOLAM 0.5 MG PO TABS
1.0000 mg | ORAL_TABLET | Freq: Three times a day (TID) | ORAL | Status: DC | PRN
  Administered 2024-03-18: 1 mg via ORAL
  Filled 2024-03-18: qty 1

## 2024-03-18 NOTE — Progress Notes (Signed)
 PROGRESS NOTE   Casey [name]  ZOX:[id number] DOB: March 06, 1961 DOA: 03/17/2024 PCP: Zarwolo, Gloria, FNP   Chief Complaint  Patient presents with   Shortness of Breath   Level of care: Med-Surg  Brief Admission History:  63 year old female with history of polysubstance abuse, chronic respiratory failure on 2 L/min, COPD, chronic tobacco use, opioid dependence, benzodiazepine dependence, history of cocaine and THC use with history of frequent hospitalizations for pneumonia and decompensated COPD presented to the emergency department today with worsening dyspnea, also complaining of arthralgias, noted in the ED to have a higher than normal oxygen requirement of up to 5 L/min.  No known sick contacts.  Patient denies chest pain.  Patient reports productive cough with whitish sputum.  Patient reports severe DOE.  Patient also reports arthralgias and back pain.  Pt was noted to be hypokalemic and pneumonia noted on CXR with new oxygen requirement noted.  She was started on IV antibiotics and hospital admission was requested.     Assessment and Plan:  Acute on chronic respiratory failure with hypoxia Acute COPD exacerbation Community Acquired pneumonia  -- Pt admitted for IV antibiotics -- IV steroids and bronchodilators ordered around the clock -- supplemental oxygen as ordered  -- pulmonary toilet -- mucinex  600 mg BID  -- delsym  cough syrup  -- flutter valve and incentive spirometry  -- pt refusing IV steroids now due to anxiety but will change over to prednisone  20 mg daily 5/11   Hypokalemia -- this was repleted -- Mg was 1.9, will give 1 dose IV 2 gram for goal Mg>2.0   Leukocytosis - Leukemoid reaction -- secondary to pneumonia and steroids  -- treating as above   Polysubstance abuse Opioid and benzodiazepine dependence -- follow up urine toxicology -- careful with controlled substances   Depression/GAD -- resume home medications  -- symptoms exacerbated by steroids --  added low dose clonazepam  PRN  DVT prophylaxis: enoxaparin  Code Status: Full  Family Communication:  Disposition: anticipating home    Consultants:  N/a Procedures:  N/a Antimicrobials:    Subjective: Pt reports ongoing cough, chest congestion, SOB and increasing anxiety, refusing to take more solumedrol.   Objective: Vitals:   03/17/24 2337 03/18/24 0544 03/18/24 0816 03/18/24 1157  BP:  124/63    Pulse:  74    Resp:  15    Temp:  (!) 97.5 F (36.4 C)    TempSrc:  Oral    SpO2: 97% 100% 99% 99%  Weight:      Height:        Intake/Output Summary (Last 24 hours) at 03/18/2024 1326 Last data filed at 03/18/2024 1215 Gross per 24 hour  Intake 127.08 ml  Output 400 ml  Net -272.92 ml   Filed Weights   03/17/24 0835  Weight: 59 kg   Examination:  General exam: Appears calm and comfortable  Respiratory system: diffuse rales heard. Mild increased work of breathing.  Cardiovascular system: normal S1 & S2 heard. No JVD, murmurs, rubs, gallops or clicks. No pedal edema. Gastrointestinal system: Abdomen is nondistended, soft and nontender. No organomegaly or masses felt. Normal bowel sounds heard. Central nervous system: Alert and oriented. No focal neurological deficits. Extremities: Symmetric 5 x 5 power. Skin: No rashes, lesions or ulcers. Psychiatry: Judgement and insight appear normal. Mood & affect appropriate.   Data Reviewed: I have personally reviewed following labs and imaging studies  CBC: Recent Labs  Lab 03/17/24 0842 03/17/24 1406 03/18/24 0512  WBC 12.9* 8.9 11.9*  NEUTROABS 9.1*  --  8.2*  HGB 11.9* 10.6* 10.7*  HCT 35.7* 32.5* 32.2*  MCV 89.3 89.8 90.4  PLT 399 329 346    Basic Metabolic Panel: Recent Labs  Lab 03/17/24 0842 03/17/24 1305 03/17/24 1406 03/18/24 0512  NA 134*  --   --  139  K 2.6* 3.3*  --  3.9  CL 95*  --   --  104  CO2 29  --   --  28  GLUCOSE 160*  --   --  114*  BUN 6*  --   --  10  CREATININE 0.49  --  0.40*  0.39*  CALCIUM  8.7*  --   --  8.5*  MG  --   --   --  1.9    CBG: No results for input(s): "GLUCAP" in the last 168 hours.  Recent Results (from the past 240 hours)  Resp panel by RT-PCR (RSV, Flu A&B, Covid) Anterior Nasal Swab     Status: None   Collection Time: 03/17/24  8:43 AM   Specimen: Anterior Nasal Swab  Result Value Ref Range Status   SARS Coronavirus 2 by RT PCR NEGATIVE NEGATIVE Final    Comment: (NOTE) SARS-CoV-2 target nucleic acids are NOT DETECTED.  The SARS-CoV-2 RNA is generally detectable in upper respiratory specimens during the acute phase of infection. The lowest concentration of SARS-CoV-2 viral copies this assay can detect is 138 copies/mL. A negative result does not preclude SARS-Cov-2 infection and should not be used as the sole basis for treatment or other patient management decisions. A negative result may occur with  improper specimen collection/handling, submission of specimen other than nasopharyngeal swab, presence of viral mutation(s) within the areas targeted by this assay, and inadequate number of viral copies(<138 copies/mL). A negative result must be combined with clinical observations, patient history, and epidemiological information. The expected result is Negative.  Fact Sheet for Patients:  BloggerCourse.com  Fact Sheet for Healthcare Providers:  SeriousBroker.it  This test is no t yet approved or cleared by the [place]  FDA and  has been authorized for detection and/or diagnosis of SARS-CoV-2 by FDA under an Emergency Use Authorization (EUA). This EUA will remain  in effect (meaning this test can be used) for the duration of the COVID-19 declaration under Section 564(b)(1) of the Act, 21 U.S.C.section 360bbb-3(b)(1), unless the authorization is terminated  or revoked sooner.       Influenza A by PCR NEGATIVE NEGATIVE Final   Influenza B by PCR NEGATIVE NEGATIVE Final     Comment: (NOTE) The Xpert Xpress SARS-CoV-2/FLU/RSV plus assay is intended as an aid in the diagnosis of influenza from Nasopharyngeal swab specimens and should not be used as a sole basis for treatment. Nasal washings and aspirates are unacceptable for Xpert Xpress SARS-CoV-2/FLU/RSV testing.  Fact Sheet for Patients: BloggerCourse.com  Fact Sheet for Healthcare Providers: SeriousBroker.it  This test is not yet approved or cleared by the [place]  FDA and has been authorized for detection and/or diagnosis of SARS-CoV-2 by FDA under an Emergency Use Authorization (EUA). This EUA will remain in effect (meaning this test can be used) for the duration of the COVID-19 declaration under Section 564(b)(1) of the Act, 21 U.S.C. section 360bbb-3(b)(1), unless the authorization is terminated or revoked.     Resp Syncytial Virus by PCR NEGATIVE NEGATIVE Final    Comment: (NOTE) Fact Sheet for Patients: BloggerCourse.com  Fact Sheet for Healthcare Providers: SeriousBroker.it  This test is not yet approved or  cleared by the [place]  FDA and has been authorized for detection and/or diagnosis of SARS-CoV-2 by FDA under an Emergency Use Authorization (EUA). This EUA will remain in effect (meaning this test can be used) for the duration of the COVID-19 declaration under Section 564(b)(1) of the Act, 21 U.S.C. section 360bbb-3(b)(1), unless the authorization is terminated or revoked.  Performed at [facility], [address] [place], [place] [address]      Radiology Studies: [facility] 1 View Result Date: 03/17/2024 CLINICAL DATA:  Shortness of breath EXAM: PORTABLE CHEST 1 VIEW COMPARISON:  Chest radiograph 11/06/2023 FINDINGS: Patient is rotated to the left. Monitoring leads overlie the patient. Stable cardiac and mediastinal contours. Increased heterogeneous opacities left mid  and lower lung. Persistent heterogeneous opacities right lung base. Biapical pleural-parenchymal thickening. No pleural effusion or pneumothorax. IMPRESSION: Increased heterogeneous opacities left mid and lower lung may represent infection in the appropriate clinical setting. Followup PA and lateral chest X-ray is recommended in 3-4 weeks following trial of antibiotic therapy to ensure resolution and exclude underlying malignancy. Electronically Signed   By: Jone Neither M.D.   On: 03/17/2024 10:09    Scheduled Meds:  citalopram   20 mg Oral Daily   enoxaparin  (LOVENOX ) injection  40 mg Subcutaneous Q24H   feeding supplement  237 mL Oral TID BM   guaiFENesin   600 mg Oral BID   ipratropium-albuterol   3 mL Nebulization Q4H WA   ketorolac   15 mg Intravenous Once   lidocaine   2 patch Transdermal Q24H   pantoprazole   40 mg Oral QPM   [START ON 03/19/2024] predniSONE   20 mg Oral Q breakfast   Continuous Infusions:  azithromycin  500 mg (03/18/24 1027)   cefTRIAXone  (ROCEPHIN )  IV 1 g (03/18/24 0942)   lactated ringers  Stopped (03/17/24 1457)     LOS: 1 day   Time spent: 55 mins  Briany Aye Lincoln Renshaw, MD How to contact the [facility] Attending or Consulting provider 7A - 7P or covering provider during after hours 7P -7A, for this patient?  Check the care team in [facility] and look for a) attending/consulting TRH provider listed and b) the TRH team listed Log into [email or web address] to find provider on call.  Locate the TRH provider you are looking for under [facility] and page to a number that you can be directly reached. If you still have difficulty reaching the provider, please page the [facility] (Director on Call) for the Hospitalists listed on amion for assistance.  03/18/2024, 1:26 PM

## 2024-03-18 NOTE — Progress Notes (Signed)
   03/18/24 1423  TOC Assessment  TOC screening is complete Yes  DTP Eligible Y  Once discharged, how will the patient get to their discharge location? Family/Friend - Partnered Transport  Barriers to Discharge Continued Medical Work up  Patient states their goals for this hospitalization and ongoing recovery are: Discharge Home  Post Acute Care Choice Home Health  Lives with: Self  Share Information with NAME Casey [name]  Patient language and need for interpreter reviewed: Yes  Care giver support system in place? Y  Appearance: Appears stated age  Attitude/Demeanor/Rapport Apprehensive  Affect (typically observed) Anxious  Orientation:  Oriented to Self;Oriented to Place;Oriented to  Time;Oriented to Situation   Admitted with Acute and chronic respiratory failure with hypoxia.

## 2024-03-18 NOTE — Plan of Care (Signed)
?  Problem: Health Behavior/Discharge Planning: ?Goal: Ability to manage health-related needs will improve ?Outcome: Progressing ?  ?Problem: Clinical Measurements: ?Goal: Will remain free from infection ?Outcome: Progressing ?Goal: Respiratory complications will improve ?Outcome: Progressing ?  ?

## 2024-03-18 NOTE — TOC Initial Note (Addendum)
 Transition of Care [facility]) - Initial/Assessment Note    Patient Details  Name: Casey [name] MRN: [id number] Date of Birth: 1961/05/17  Transition of [facility]) CM/SW Contact:    Lynda Sands, RN Phone Number: 03/18/2024, 2:30 PM  Clinical Narrative:    CM met with patient at bedside. Admitted with Acute and chronic respiratory failure. Patient lives alone. Patient is independent with adl's. Patient is on 2l/min Oxygen at home. Patient has home aides starting the week.  Patient uses medical transportation for ride to appointments.          Barriers to Discharge: Continued Medical Work up   Patient Goals and CMS Choice Patient states their goals for this hospitalization and ongoing recovery are:: Discharge Home   Expected Discharge Plan and Services     Post Acute Care Choice: Home Health    Prior Living Arrangements/Services   Lives with:: Self Patient language and need for interpreter reviewed:: Yes          Care giver support system in place?: Yes (comment)      Activities of Daily Living   ADL Screening (condition at time of admission) Independently performs ADLs?: No Does the patient have a NEW difficulty with bathing/dressing/toileting/self-feeding that is expected to last >3 days?: Yes (Initiates electronic notice to provider for possible OT consult) Does the patient have a NEW difficulty with getting in/out of bed, walking, or climbing stairs that is expected to last >3 days?: Yes (Initiates electronic notice to provider for possible PT consult) Does the patient have a NEW difficulty with communication that is expected to last >3 days?: No Is the patient deaf or have difficulty hearing?: No Does the patient have difficulty seeing, even when wearing glasses/contacts?: No Does the patient have difficulty concentrating, remembering, or making decisions?: No  Permission Sought/Granted      Share Information with NAME: Gerldine Koch           Emotional  Assessment Appearance:: Appears stated age Attitude/Demeanor/Rapport: Apprehensive Affect (typically observed): Anxious Orientation: : Oriented to Self, Oriented to Place, Oriented to  Time, Oriented to Situation      Admission diagnosis:  Hypokalemia [E87.6] COPD exacerbation (HCC) [J44.1] Acute and chronic respiratory failure with hypoxia (HCC) [J96.21] Community acquired pneumonia, unspecified laterality [J18.9] Patient Active Problem List   Diagnosis Date Noted   Cigarette smoker 02/10/2024   Chronic respiratory failure with hypoxia (HCC) 02/10/2024   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   Pneumothorax on left 11/26/2023   Asthmatic bronchitis , chronic (HCC) 09/28/2023   Encounter for immunization 08/26/2023   Tobacco abuse 08/15/2023   Malnutrition of moderate degree 07/27/2023   CAP (community acquired pneumonia) 07/26/2023   Acute and chronic respiratory failure with hypoxia (HCC) 07/26/2023   Cocaine abuse 07/26/2023   Tetrahydrocannabinol (THC) use disorder 07/26/2023   Hypokalemia 07/26/2023   Hyperglycemia 07/26/2023   Drug-seeking behavior 07/26/2023   Leukocytosis 07/26/2023   Opioid use disorder 07/17/2023   Pneumonia of right upper lobe due to infectious organism 07/08/2023   Delirium 07/07/2023   Vomiting without nausea 07/07/2023   COPD with acute exacerbation (HCC) 07/06/2023   Normocytic anemia 07/06/2023   Pneumonia of right lower lobe due to infectious organism 07/06/2023   Nausea and vomiting 07/05/2023   Severe protein-calorie malnutrition (HCC) 06/29/2023   Polysubstance abuse (HCC) 06/26/2023   Acute metabolic encephalopathy 06/26/2023   Aspiration pneumonia (HCC) 06/25/2023   Primary insomnia 08/20/2021   Depression, recurrent (HCC) 08/20/2021   GAD (generalized anxiety  disorder) 08/20/2021   Elevated blood pressure reading in office without diagnosis of hypertension 08/20/2021   Closed fracture of left proximal humerus 01/01/2020    Fracture of base of fifth metacarpal bone of right hand 10/06/2016   Benzodiazepine dependence, continuous (HCC) 01/09/2016   History of panic attacks 09/11/2014   Malaise 09/03/2014   Fatigue 09/03/2014   Body mass index (BMI) of 20.0-20.9 in adult 09/03/2014   PCP:  Zarwolo, Gloria, FNP Pharmacy:   [facility] [phone] - [place], [place] - [address] [place] [address] Phone: [phone] Fax: [phone]     Social Drivers of Health (SDOH) Social History: SDOH Screenings   Food Insecurity: Food Insecurity Present (03/17/2024)  Housing: Low Risk  (03/17/2024)  Transportation Needs: No Transportation Needs (03/17/2024)  Utilities: Not At Risk (03/17/2024)  Depression (PHQ2-9): Low Risk  (08/26/2023)  Tobacco Use: High Risk (03/17/2024)   SDOH Interventions:     Readmission Risk Interventions    08/16/2023    2:16 PM 07/27/2023   10:34 AM  Readmission Risk Prevention Plan  Transportation Screening Complete Complete  HRI or Home Care Consult  Complete  Social Work Consult for Recovery Care Planning/Counseling  Complete  Palliative Care Screening  Not Applicable  Medication Review Oceanographer) Complete Complete  HRI or Home Care Consult Complete   SW Recovery Care/Counseling Consult Complete   Palliative Care Screening Not Applicable   Skilled Nursing Facility Not Applicable

## 2024-03-18 NOTE — Progress Notes (Addendum)
 03/18/2024  Called to come to bedside, pt developed acute hypoxia per RN and patient claims having severe panic attack, pulse ox reading at 67% and difficult to come up with increasing oxygen delivery.  Pt found to be tachypneic and pale appearing; she is mentating well; she is answering questions, she seems to have severe anxiety and tells me she is taking xanax off the street at least 1 mg tabs that we were not aware of.  I ordered an ABG and portable CXR stat.  She had just received a bronchodilator treatment. She is having shallow breathing with bilateral expiratory wheezing.  She is dyspneic.   ABG    Component Value Date/Time   PHART 7.36 03/18/2024 1425   PCO2ART 54 (H) 03/18/2024 1425   PO2ART 47 (L) 03/18/2024 1425   HCO3 30.4 (H) 03/18/2024 1425   TCO2 30 07/01/2023 0504   ACIDBASEDEF 0.7 06/30/2023 0420   O2SAT 82.7 03/18/2024 1425   She is having severe low partial pressure of oxygen likely from v/q mismatch due to her COPD but given the acuteness of her symptoms also concerned about PE, will start patient on bipap, transfer to stepdown ICU, check a D dimer stat and if elevated will order a CTA chest study to rule out PE.  Pt and family updated at bedside. Xanax ordered for severe anxiety symptoms.  Time spent: 40 mins  Critical Care Procedure Note Authorized and Performed by: Olga Berthold MD  Total Critical Care time:  40 mins Due to a high probability of clinically significant, life threatening deterioration, the patient required my highest level of preparedness to intervene emergently and I personally spent this critical care time directly and personally managing the patient.  This critical care time included obtaining a history; examining the patient, pulse oximetry; ordering and review of studies; arranging urgent treatment with development of a management plan; evaluation of patient's response of treatment; frequent reassessment; and discussions with other providers.  This  critical care time was performed to assess and manage the high probability of imminent and life threatening deterioration that could result in multi-organ failure.  It was exclusive of separately billable procedures and treating other patients and teaching time.   Ben Bracken, MD  How to contact the [facility] Attending or Consulting provider 7A - 7P or covering provider during after hours 7P -7A, for this patient?  Check the care team in [facility] and look for a) attending/consulting TRH provider listed and b) the TRH team listed Log into [email or web address] and use [facility] universal password to access. If you do not have the password, please contact the hospital operator. Locate the TR[facility] provider you are looking for under [facility] and page to a number that you can be directly reached. If you still have difficulty reaching the provider, please page the [facility] (Director on Call) for the Hospitalists listed on amion for assistance.

## 2024-03-18 NOTE — Progress Notes (Signed)
 03/18/2024 6:20 PM  D dimer <0.27 is reassuring, hold CTA chest PE study Pt moved to stepdown ICU on bipap Continue to monitor closely as she is high risk for intubation   C.Jarryn Altland MD  How to contact the [facility] Attending or Consulting provider 7A - 7P or covering provider during after hours 7P -7A, for this patient?  Check the care team in [facility] and look for a) attending/consulting TRH provider listed and b) the TRH team listed Log into [email or web address] and use [place] universal password to access. If you do not have the password, please contact the hospital operator. Locate the [facility] provider you are looking for under [facility] and page to a number that you can be directly reached. If you still have difficulty reaching the provider, please page the [facility] (Director on Call) for the Hospitalists listed on amion for assistance.

## 2024-03-18 NOTE — Plan of Care (Signed)
 Discussed with patient earlier plan of care for the evening, pain management and bedtime medicaions with some teach back displayed.  Patient liked how would cluster care especially with medications to maximize her time on the BiPap this shift and perform mouth care.  Problem: Education: Goal: Knowledge of General Education information will improve Description: Including pain rating scale, medication(s)/side effects and non-pharmacologic comfort measures Outcome: Progressing   Problem: Health Behavior/Discharge Planning: Goal: Ability to manage health-related needs will improve Outcome: Progressing

## 2024-03-19 ENCOUNTER — Inpatient Hospital Stay (HOSPITAL_COMMUNITY): Payer: MEDICAID

## 2024-03-19 DIAGNOSIS — J441 Chronic obstructive pulmonary disease with (acute) exacerbation: Secondary | ICD-10-CM | POA: Diagnosis not present

## 2024-03-19 DIAGNOSIS — J189 Pneumonia, unspecified organism: Secondary | ICD-10-CM | POA: Diagnosis not present

## 2024-03-19 DIAGNOSIS — E876 Hypokalemia: Secondary | ICD-10-CM

## 2024-03-19 DIAGNOSIS — Z720 Tobacco use: Secondary | ICD-10-CM

## 2024-03-19 DIAGNOSIS — F411 Generalized anxiety disorder: Secondary | ICD-10-CM | POA: Diagnosis not present

## 2024-03-19 DIAGNOSIS — J9621 Acute and chronic respiratory failure with hypoxia: Secondary | ICD-10-CM | POA: Diagnosis not present

## 2024-03-19 LAB — RAPID URINE DRUG SCREEN, HOSP PERFORMED
Amphetamines: NOT DETECTED
Barbiturates: NOT DETECTED
Benzodiazepines: POSITIVE — AB
Cocaine: NOT DETECTED
Opiates: POSITIVE — AB
Tetrahydrocannabinol: NOT DETECTED

## 2024-03-19 LAB — CBC
HCT: 32.8 % — ABNORMAL LOW (ref 36.0–46.0)
Hemoglobin: 10.6 g/dL — ABNORMAL LOW (ref 12.0–15.0)
MCH: 29.9 pg (ref 26.0–34.0)
MCHC: 32.3 g/dL (ref 30.0–36.0)
MCV: 92.4 fL (ref 80.0–100.0)
Platelets: 376 10*3/uL (ref 150–400)
RBC: 3.55 MIL/uL — ABNORMAL LOW (ref 3.87–5.11)
RDW: 12.7 % (ref 11.5–15.5)
WBC: 12.7 10*3/uL — ABNORMAL HIGH (ref 4.0–10.5)
nRBC: 0 % (ref 0.0–0.2)

## 2024-03-19 LAB — BASIC METABOLIC PANEL WITH GFR
Anion gap: 9 (ref 5–15)
BUN: 14 mg/dL (ref 8–23)
CO2: 29 mmol/L (ref 22–32)
Calcium: 8.9 mg/dL (ref 8.9–10.3)
Chloride: 101 mmol/L (ref 98–111)
Creatinine, Ser: 0.62 mg/dL (ref 0.44–1.00)
GFR, Estimated: >60 mL/min (ref >60)
Glucose, Bld: 185 mg/dL — ABNORMAL HIGH (ref 70–99)
Potassium: 4.1 mmol/L (ref 3.5–5.1)
Sodium: 139 mmol/L (ref 135–145)

## 2024-03-19 LAB — BLOOD GAS, VENOUS
Acid-Base Excess: 10.1 mmol/L — ABNORMAL HIGH (ref 0.0–2.0)
Bicarbonate: 37.5 mmol/L — ABNORMAL HIGH (ref 20.0–28.0)
Drawn by: 8368
O2 Saturation: 80.8 %
Patient temperature: 36.2
pCO2, Ven: 60 mmHg (ref 44–60)
pH, Ven: 7.4 (ref 7.25–7.43)
pO2, Ven: 46 mmHg — ABNORMAL HIGH (ref 32–45)

## 2024-03-19 LAB — MAGNESIUM: Magnesium: 2 mg/dL (ref 1.7–2.4)

## 2024-03-19 MED ORDER — FUROSEMIDE 10 MG/ML IJ SOLN
30.0000 mg | Freq: Once | INTRAMUSCULAR | Status: AC
  Administered 2024-03-19: 30 mg via INTRAVENOUS
  Filled 2024-03-19: qty 4

## 2024-03-19 MED ORDER — ALPRAZOLAM 0.5 MG PO TABS
1.0000 mg | ORAL_TABLET | Freq: Three times a day (TID) | ORAL | Status: DC | PRN
  Administered 2024-03-19 – 2024-03-22 (×10): 1 mg via ORAL
  Filled 2024-03-19 (×10): qty 2

## 2024-03-19 MED ORDER — ALBUTEROL SULFATE (2.5 MG/3ML) 0.083% IN NEBU
2.5000 mg | INHALATION_SOLUTION | RESPIRATORY_TRACT | Status: DC | PRN
  Administered 2024-03-21: 2.5 mg via RESPIRATORY_TRACT
  Filled 2024-03-19: qty 3

## 2024-03-19 NOTE — Progress Notes (Signed)
 Patient requested that Bipap be remove so she could eat at 0745. Placed back on high flow

## 2024-03-19 NOTE — Progress Notes (Addendum)
 PROGRESS NOTE   Casey [name]  NWG:[id number] DOB: 1961/02/11 DOA: 03/17/2024 PCP: Zarwolo, Gloria, FNP   Chief Complaint  Patient presents with   Shortness of Breath   Level of care: Stepdown  Brief Admission History:  63 year old female with history of polysubstance abuse, chronic respiratory failure on 2 L/min, COPD, chronic tobacco use, opioid dependence, benzodiazepine dependence, history of cocaine and THC use with history of frequent hospitalizations for pneumonia and decompensated COPD presented to the emergency department today with worsening dyspnea, also complaining of arthralgias, noted in the ED to have a higher than normal oxygen requirement of up to 5 L/min.  No known sick contacts.  Patient denies chest pain.  Patient reports productive cough with whitish sputum.  Patient reports severe DOE.  Patient also reports arthralgias and back pain.  Pt was noted to be hypokalemic and pneumonia noted on CXR with new oxygen requirement noted.  She was started on IV antibiotics and hospital admission was requested.     Assessment and Plan:  Acute on chronic respiratory failure with hypoxia Acute COPD exacerbation Community Acquired pneumonia  -- Pt admitted for IV antibiotics -- IV steroids and bronchodilators ordered around the clock -- supplemental oxygen as ordered  -- pulmonary toilet -- mucinex  600 mg BID  -- delsym  cough syrup  -- flutter valve and incentive spirometry  -- pt was transferred to stepdown ICU on 5/10 and started on bipap therapy due to acute decompensation and low partial pressure oxygen (pO2 47) --IV lasix  given on 5/10, repeat dose on 5/11, follow up morning CXR    Hypokalemia -- this was repleted -- Mg was 1.9, will give 1 dose IV 2 gram for goal Mg>2.0   Leukocytosis - Leukemoid reaction -- secondary to pneumonia and steroids  -- treating as above   Polysubstance abuse Opioid and benzodiazepine dependence -- follow up urine toxicology -- careful  with controlled substances   Depression/GAD -- resume home medications  -- symptoms exacerbated by steroids -- added low dose clonazepam  PRN  DVT prophylaxis: enoxaparin  Code Status: Full  Family Communication: bedside update on 5/10 Disposition: anticipating home    Consultants:  N/a Procedures:  N/a   Subjective: Pt was able to briefly come off bipap to be able to eat and take meds.  She says that she needed the bipap therapy and she is less SOB on the treatment. She is requesting alprazolam be resumed vs IV lorazepam  ordered.    Objective: Vitals:   03/19/24 0600 03/19/24 0624 03/19/24 0700 03/19/24 0800  BP: 100/69  97/67 109/69  Pulse: 93 92 94 92  Resp: (!) 26 (!) 21 (!) 33 (!) 39  Temp:    98.1 F (36.7 C)  TempSrc:    Oral  SpO2: 99% 100% 100% 100%  Weight:      Height:        Intake/Output Summary (Last 24 hours) at 03/19/2024 0915 Last data filed at 03/19/2024 0900 Gross per 24 hour  Intake 809.29 ml  Output 750 ml  Net 59.29 ml   Filed Weights   03/17/24 0835  Weight: 59 kg   Examination:  General exam: Appears calm and comfortable  Respiratory system: diffuse rales heard. Tight lungs poor air movement, Frequent cough with yellow white sputum, Mild increased work of breathing.  Cardiovascular system: normal S1 & S2 heard. No JVD, murmurs, rubs, gallops or clicks. No pedal edema. Gastrointestinal system: Abdomen is nondistended, soft and nontender. No organomegaly or masses felt. Normal bowel sounds  heard. Central nervous system: Alert and oriented. No focal neurological deficits. Extremities: Symmetric 5 x 5 power. Skin: No rashes, lesions or ulcers. Psychiatry: Judgement and insight appear normal. Mood & affect appropriate.   Data Reviewed: I have personally reviewed following labs and imaging studies  CBC: Recent Labs  Lab 03/17/24 0842 03/17/24 1406 03/18/24 0512 03/19/24 0646  WBC 12.9* 8.9 11.9* 12.7*  NEUTROABS 9.1*  --  8.2*  --   HGB  11.9* 10.6* 10.7* 10.6*  HCT 35.7* 32.5* 32.2* 32.8*  MCV 89.3 89.8 90.4 92.4  PLT 399 329 346 376    Basic Metabolic Panel: Recent Labs  Lab 03/17/24 0842 03/17/24 1305 03/17/24 1406 03/18/24 0512 03/19/24 0646  NA 134*  --   --  139 139  K 2.6* 3.3*  --  3.9 4.1  CL 95*  --   --  104 101  CO2 29  --   --  28 29  GLUCOSE 160*  --   --  114* 185*  BUN 6*  --   --  10 14  CREATININE 0.49  --  0.40* 0.39* 0.62  CALCIUM  8.7*  --   --  8.5* 8.9  MG  --   --   --  1.9 2.0    CBG: No results for input(s): "GLUCAP" in the last 168 hours.  Recent Results (from the past 240 hours)  Resp panel by RT-PCR (RSV, Flu A&B, Covid) Anterior Nasal Swab     Status: None   Collection Time: 03/17/24  8:43 AM   Specimen: Anterior Nasal Swab  Result Value Ref Range Status   SARS Coronavirus 2 by RT PCR NEGATIVE NEGATIVE Final    Comment: (NOTE) SARS-CoV-2 target nucleic acids are NOT DETECTED.  The SARS-CoV-2 RNA is generally detectable in upper respiratory specimens during the acute phase of infection. The lowest concentration of SARS-CoV-2 viral copies this assay can detect is 138 copies/mL. A negative result does not preclude SARS-Cov-2 infection and should not be used as the sole basis for treatment or other patient management decisions. A negative result may occur with  improper specimen collection/handling, submission of specimen other than nasopharyngeal swab, presence of viral mutation(s) within the areas targeted by this assay, and inadequate number of viral copies(<138 copies/mL). A negative result must be combined with clinical observations, patient history, and epidemiological information. The expected result is Negative.  Fact Sheet for Patients:  BloggerCourse.com  Fact Sheet for Healthcare Providers:  SeriousBroker.it  This test is no t yet approved or cleared by the [place]  FDA and  has been authorized for  detection and/or diagnosis of SARS-CoV-2 by FDA under an Emergency Use Authorization (EUA). This EUA will remain  in effect (meaning this test can be used) for the duration of the COVID-19 declaration under Section 564(b)(1) of the Act, 21 U.S.C.section 360bbb-3(b)(1), unless the authorization is terminated  or revoked sooner.       Influenza A by PCR NEGATIVE NEGATIVE Final   Influenza B by PCR NEGATIVE NEGATIVE Final    Comment: (NOTE) The Xpert Xpress SARS-CoV-2/FLU/RSV plus assay is intended as an aid in the diagnosis of influenza from Nasopharyngeal swab specimens and should not be used as a sole basis for treatment. Nasal washings and aspirates are unacceptable for Xpert Xpress SARS-CoV-2/FLU/RSV testing.  Fact Sheet for Patients: BloggerCourse.com  Fact Sheet for Healthcare Providers: SeriousBroker.it  This test is not yet approved or cleared by the [place]  FDA and has been authorized for detection  and/or diagnosis of SARS-CoV-2 by FDA under an Emergency Use Authorization (EUA). This EUA will remain in effect (meaning this test can be used) for the duration of the COVID-19 declaration under Section 564(b)(1) of the Act, 21 U.S.C. section 360bbb-3(b)(1), unless the authorization is terminated or revoked.     Resp Syncytial Virus by PCR NEGATIVE NEGATIVE Final    Comment: (NOTE) Fact Sheet for Patients: BloggerCourse.com  Fact Sheet for Healthcare Providers: SeriousBroker.it  This test is not yet approved or cleared by the [place]  FDA and has been authorized for detection and/or diagnosis of SARS-CoV-2 by FDA under an Emergency Use Authorization (EUA). This EUA will remain in effect (meaning this test can be used) for the duration of the COVID-19 declaration under Section 564(b)(1) of the Act, 21 U.S.C. section 360bbb-3(b)(1), unless the authorization is  terminated or revoked.  Performed at [facility], [address], [place], [place] [address]   MRSA Next Gen by PCR, Nasal     Status: None   Collection Time: 03/18/24  4:40 PM   Specimen: Nasal Mucosa; Nasal Swab  Result Value Ref Range Status   MRSA by PCR Next Gen NOT DETECTED NOT DETECTED Final    Comment: (NOTE) The GeneXpert MRSA Assay (FDA approved for NASAL specimens only), is one component of a comprehensive MRSA colonization surveillance program. It is not intended to diagnose MRSA infection nor to guide or monitor treatment for MRSA infections. Test performance is not FDA approved in patients less than 37 years old. Performed at [facility], [address], [place], [place] [address]     Radiology Studies: DG CHEST PORT 1 VIEW Result Date: 03/18/2024 CLINICAL DATA:  Acute respiratory failure with hypoxia. Shortness of breath. EXAM: PORTABLE CHEST 1 VIEW COMPARISON:  Chest radiograph yesterday FINDINGS: The heart is stable in size. Unchanged mediastinal contours. Worsening ill-defined opacities throughout the left lung, with developing ill-defined right perihilar opacity. No pneumothorax or significant pleural effusion. Remote left rib fractures. IMPRESSION: Worsening ill-defined opacities throughout the left lung, with developing ill-defined right perihilar opacity. Findings may represent asymmetric edema or infection, including atypical organisms. Electronically Signed   By: Chadwick Colonel M.D.   On: 03/18/2024 16:33   Scheduled Meds:  Chlorhexidine  Gluconate Cloth  6 each Topical Q0600   citalopram   20 mg Oral Daily   enoxaparin  (LOVENOX ) injection  40 mg Subcutaneous Q24H   feeding supplement  237 mL Oral TID BM   furosemide   30 mg Intravenous Once   guaiFENesin   600 mg Oral BID   ipratropium-albuterol   3 mL Nebulization Q4H WA   lidocaine   2 patch Transdermal Q24H   methylPREDNISolone  (SOLU-MEDROL ) injection  60 mg Intravenous Q12H   pantoprazole   40 mg Oral QPM    Continuous Infusions:  azithromycin  Stopped (03/18/24 1135)   cefTRIAXone  (ROCEPHIN )  IV Stopped (03/18/24 1017)    LOS: 2 days   Critical Care Procedure Note Authorized and Performed by: Olga Berthold MD  Total Critical Care time:  56 mins Due to a high probability of clinically significant, life threatening deterioration, the patient required my highest level of preparedness to intervene emergently and I personally spent this critical care time directly and personally managing the patient.  This critical care time included obtaining a history; examining the patient, pulse oximetry; ordering and review of studies; arranging urgent treatment with development of a management plan; evaluation of patient's response of treatment; frequent reassessment; and discussions with other providers.  This critical care time was performed to  assess and manage the high probability of imminent and life threatening deterioration that could result in multi-organ failure.  It was exclusive of separately billable procedures and treating other patients and teaching time.    Faustino Hook, MD How to contact the [facility] Attending or Consulting provider 7A - 7P or covering provider during after hours 7P -7A, for this patient?  Check the care team in [facility] and look for a) attending/consulting [facility] provider listed and b) the [facility] team listed Log into [email or web address] to find provider on call.  Locate the [facility] provider you are looking for under [facility] and page to a number that you can be directly reached. If you still have difficulty reaching the provider, please page the [facility] (Director on Call) for the Hospitalists listed on amion for assistance.  03/19/2024, 9:15 AM

## 2024-03-19 NOTE — Progress Notes (Signed)
 Asked to be placed back on Bipap at [facility]

## 2024-03-20 ENCOUNTER — Encounter (HOSPITAL_COMMUNITY): Payer: Self-pay

## 2024-03-20 DIAGNOSIS — F339 Major depressive disorder, recurrent, unspecified: Secondary | ICD-10-CM | POA: Diagnosis not present

## 2024-03-20 DIAGNOSIS — J9621 Acute and chronic respiratory failure with hypoxia: Secondary | ICD-10-CM | POA: Diagnosis not present

## 2024-03-20 DIAGNOSIS — F1190 Opioid use, unspecified, uncomplicated: Secondary | ICD-10-CM | POA: Diagnosis not present

## 2024-03-20 DIAGNOSIS — F1320 Sedative, hypnotic or anxiolytic dependence, uncomplicated: Secondary | ICD-10-CM | POA: Diagnosis not present

## 2024-03-20 LAB — BASIC METABOLIC PANEL WITH GFR
Anion gap: 9 (ref 5–15)
BUN: 21 mg/dL (ref 8–23)
CO2: 32 mmol/L (ref 22–32)
Calcium: 8.9 mg/dL (ref 8.9–10.3)
Chloride: 97 mmol/L — ABNORMAL LOW (ref 98–111)
Creatinine, Ser: 0.5 mg/dL (ref 0.44–1.00)
GFR, Estimated: >60 mL/min (ref >60)
Glucose, Bld: 164 mg/dL — ABNORMAL HIGH (ref 70–99)
Potassium: 4.2 mmol/L (ref 3.5–5.1)
Sodium: 138 mmol/L (ref 135–145)

## 2024-03-20 MED ORDER — IPRATROPIUM-ALBUTEROL 0.5-2.5 (3) MG/3ML IN SOLN
3.0000 mL | Freq: Four times a day (QID) | RESPIRATORY_TRACT | Status: DC
  Administered 2024-03-20 – 2024-03-22 (×9): 3 mL via RESPIRATORY_TRACT
  Filled 2024-03-20 (×10): qty 3

## 2024-03-20 NOTE — Progress Notes (Signed)
 Patient taken off Bipap to eat.

## 2024-03-20 NOTE — Progress Notes (Signed)
 Patient taken off of bipap to eat, o2 sats remain 92-97% on 4L, patient wishes to have bipap off for a while until bedtime, bipap is now PRN.

## 2024-03-20 NOTE — Plan of Care (Signed)
  Problem: Education: Goal: Knowledge of General Education information will improve Description: Including pain rating scale, medication(s)/side effects and non-pharmacologic comfort measures Outcome: Progressing   Problem: Clinical Measurements: Goal: Ability to maintain clinical measurements within normal limits will improve Outcome: Progressing Goal: Diagnostic test results will improve Outcome: Progressing   Problem: Health Behavior/Discharge Planning: Goal: Ability to manage health-related needs will improve Outcome: Progressing

## 2024-03-20 NOTE — Progress Notes (Signed)
 Patient requested to be back on bipap, bipap placed

## 2024-03-20 NOTE — Progress Notes (Signed)
 PROGRESS NOTE   Casey [name]  NFA:[id number] DOB: 1961-10-23 DOA: 03/17/2024 PCP: Zarwolo, Gloria, FNP   Chief Complaint  Patient presents with   Shortness of Breath   Level of care: Stepdown  Brief Admission History:  63 year old female with history of polysubstance abuse, chronic respiratory failure on 2 L/min, COPD, chronic tobacco use, opioid dependence, benzodiazepine dependence, history of cocaine and THC use with history of frequent hospitalizations for pneumonia and decompensated COPD presented to the emergency department today with worsening dyspnea, also complaining of arthralgias, noted in the ED to have a higher than normal oxygen requirement of up to 5 L/min.  No known sick contacts.  Patient denies chest pain.  Patient reports productive cough with whitish sputum.  Patient reports severe DOE.  Patient also reports arthralgias and back pain.  Pt was noted to be hypokalemic and pneumonia noted on CXR with new oxygen requirement noted.  She was started on IV antibiotics and hospital admission was requested.     Assessment and Plan:  Acute on chronic respiratory failure with hypoxia Acute COPD exacerbation Community Acquired pneumonia  -- Pt admitted for IV antibiotics -- IV steroids and bronchodilators ordered around the clock -- supplemental oxygen as ordered  -- pulmonary toilet -- mucinex  600 mg BID  -- delsym  cough syrup  -- flutter valve and incentive spirometry  -- pt was transferred to stepdown ICU on 5/10 and started on bipap therapy due to acute decompensation and low partial pressure oxygen (pO2 47) -- IV lasix  given on 5/10, repeat dose on 5/11, follow up morning CXR  -- continue bipap therapy, pt deciding about getting a home NIV   Hypokalemia -- this was repleted -- Mg was 1.9, s/p IV 2 gram for goal Mg>2.0   Leukocytosis - Leukemoid reaction -- secondary to pneumonia and steroids  -- treating as above   Polysubstance abuse Opioid and benzodiazepine  dependence -- follow up urine toxicology -- careful with controlled substances   Depression/GAD -- resume home medications  -- symptoms exacerbated by steroids -- added alprazolam PRN  DVT prophylaxis: enoxaparin  Code Status: Full  Family Communication: bedside update on 5/10 Disposition: anticipating home    Consultants:  N/a  Procedures:  N/a   Subjective: Reports remains very SOB but the bipap helps a lot  Objective: Vitals:   03/20/24 0300 03/20/24 0400 03/20/24 0600 03/20/24 0729  BP: 119/76 (!) 88/66 (!) 92/53   Pulse: 91 79 73   Resp: (!) 30 (!) 23 (!) 27   Temp:  98.7 F (37.1 C)  98.2 F (36.8 C)  TempSrc:    Axillary  SpO2: 100% 99% 100%   Weight:      Height:        Intake/Output Summary (Last 24 hours) at 03/20/2024 1140 Last data filed at 03/20/2024 0930 Gross per 24 hour  Intake 830 ml  Output 1500 ml  Net -670 ml   Filed Weights   03/17/24 0835  Weight: 59 kg   Examination:  General exam: Appears calm and comfortable although anxious Respiratory system: diffuse rales heard. Tight lungs poor air movement, Frequent cough with yellow white sputum, Mild increased work of breathing with tachypnea.  Cardiovascular system: normal S1 & S2 heard. No JVD, murmurs, rubs, gallops or clicks. No pedal edema. Gastrointestinal system: Abdomen is nondistended, soft and nontender. No organomegaly or masses felt. Normal bowel sounds heard. Central nervous system: Alert and oriented. No focal neurological deficits. Extremities: Symmetric 5 x 5 power. Skin: No rashes,  lesions or ulcers. Psychiatry: Judgement and insight appear normal. Mood & affect anxious.   Data Reviewed: I have personally reviewed following labs and imaging studies  CBC: Recent Labs  Lab 03/17/24 0842 03/17/24 1406 03/18/24 0512 03/19/24 0646  WBC 12.9* 8.9 11.9* 12.7*  NEUTROABS 9.1*  --  8.2*  --   HGB 11.9* 10.6* 10.7* 10.6*  HCT 35.7* 32.5* 32.2* 32.8*  MCV 89.3 89.8 90.4 92.4   PLT 399 329 346 376    Basic Metabolic Panel: Recent Labs  Lab 03/17/24 0842 03/17/24 1305 03/17/24 1406 03/18/24 0512 03/19/24 0646 03/20/24 0554  NA 134*  --   --  139 139 138  K 2.6* 3.3*  --  3.9 4.1 4.2  CL 95*  --   --  104 101 97*  CO2 29  --   --  28 29 32  GLUCOSE 160*  --   --  114* 185* 164*  BUN 6*  --   --  10 14 21   CREATININE 0.49  --  0.40* 0.39* 0.62 0.50  CALCIUM  8.7*  --   --  8.5* 8.9 8.9  MG  --   --   --  1.9 2.0  --     CBG: No results for input(s): "GLUCAP" in the last 168 hours.  Recent Results (from the past 240 hours)  Resp panel by RT-PCR (RSV, Flu A&B, Covid) Anterior Nasal Swab     Status: None   Collection Time: 03/17/24  8:43 AM   Specimen: Anterior Nasal Swab  Result Value Ref Range Status   SARS Coronavirus 2 by RT PCR NEGATIVE NEGATIVE Final    Comment: (NOTE) SARS-CoV-2 target nucleic acids are NOT DETECTED.  The SARS-CoV-2 RNA is generally detectable in upper respiratory specimens during the acute phase of infection. The lowest concentration of SARS-CoV-2 viral copies this assay can detect is 138 copies/mL. A negative result does not preclude SARS-Cov-2 infection and should not be used as the sole basis for treatment or other patient management decisions. A negative result may occur with  improper specimen collection/handling, submission of specimen other than nasopharyngeal swab, presence of viral mutation(s) within the areas targeted by this assay, and inadequate number of viral copies(<138 copies/mL). A negative result must be combined with clinical observations, patient history, and epidemiological information. The expected result is Negative.  Fact Sheet for Patients:  BloggerCourse.com  Fact Sheet for Healthcare Providers:  SeriousBroker.it  This test is no t yet approved or cleared by the [place]  FDA and  has been authorized for detection and/or diagnosis of  SARS-CoV-2 by FDA under an Emergency Use Authorization (EUA). This EUA will remain  in effect (meaning this test can be used) for the duration of the COVID-19 declaration under Section 564(b)(1) of the Act, 21 U.S.C.section 360bbb-3(b)(1), unless the authorization is terminated  or revoked sooner.       Influenza A by PCR NEGATIVE NEGATIVE Final   Influenza B by PCR NEGATIVE NEGATIVE Final    Comment: (NOTE) The Xpert Xpress SARS-CoV-2/FLU/RSV plus assay is intended as an aid in the diagnosis of influenza from Nasopharyngeal swab specimens and should not be used as a sole basis for treatment. Nasal washings and aspirates are unacceptable for Xpert Xpress SARS-CoV-2/FLU/RSV testing.  Fact Sheet for Patients: BloggerCourse.com  Fact Sheet for Healthcare Providers: SeriousBroker.it  This test is not yet approved or cleared by the [place]  FDA and has been authorized for detection and/or diagnosis of SARS-CoV-2 by FDA under  an Emergency Use Authorization (EUA). This EUA will remain in effect (meaning this test can be used) for the duration of the COVID-19 declaration under Section 564(b)(1) of the Act, 21 U.S.C. section 360bbb-3(b)(1), unless the authorization is terminated or revoked.     Resp Syncytial Virus by PCR NEGATIVE NEGATIVE Final    Comment: (NOTE) Fact Sheet for Patients: BloggerCourse.com  Fact Sheet for Healthcare Providers: SeriousBroker.it  This test is not yet approved or cleared by the [place]  FDA and has been authorized for detection and/or diagnosis of SARS-CoV-2 by FDA under an Emergency Use Authorization (EUA). This EUA will remain in effect (meaning this test can be used) for the duration of the COVID-19 declaration under Section 564(b)(1) of the Act, 21 U.S.C. section 360bbb-3(b)(1), unless the authorization is terminated  or revoked.  Performed at [facility], [address] [place], [place] [address]   MRSA Next Gen by PCR, Nasal     Status: None   Collection Time: 03/18/24  4:40 PM   Specimen: Nasal Mucosa; Nasal Swab  Result Value Ref Range Status   MRSA by PCR Next Gen NOT DETECTED NOT DETECTED Final    Comment: (NOTE) The GeneXpert MRSA Assay (FDA approved for NASAL specimens only), is one component of a comprehensive MRSA colonization surveillance program. It is not intended to diagnose MRSA infection nor to guide or monitor treatment for MRSA infections. Test performance is not FDA approved in patients less than 27 years old. Performed at [facility], [address] [place], [place] [address]     Radiology Studies: DG CHEST PORT 1 VIEW Result Date: 03/19/2024 CLINICAL DATA:  Acute on chronic respiratory failure and hypoxia EXAM: PORTABLE CHEST 1 VIEW COMPARISON:  03/18/2024 FINDINGS: Heart size and mediastinal contours are stable. Diffuse chronic coarsened interstitial markings with bronchial wall thickening is again noted. Interstitial edema pattern has decreased from the previous exam. No pleural effusion or pneumothorax. Multiple chronic remote left rib fractures. Status post left shoulder arthroplasty. IMPRESSION: 1. Improvement in interstitial edema pattern. Electronically Signed   By: Kimberley Penman M.D.   On: 03/19/2024 09:41   DG CHEST PORT 1 VIEW Result Date: 03/18/2024 CLINICAL DATA:  Acute respiratory failure with hypoxia. Shortness of breath. EXAM: PORTABLE CHEST 1 VIEW COMPARISON:  Chest radiograph yesterday FINDINGS: The heart is stable in size. Unchanged mediastinal contours. Worsening ill-defined opacities throughout the left lung, with developing ill-defined right perihilar opacity. No pneumothorax or significant pleural effusion. Remote left rib fractures. IMPRESSION: Worsening ill-defined opacities throughout the left lung, with developing ill-defined right perihilar opacity.  Findings may represent asymmetric edema or infection, including atypical organisms. Electronically Signed   By: Chadwick Colonel M.D.   On: 03/18/2024 16:33   Scheduled Meds:  Chlorhexidine  Gluconate Cloth  6 each Topical Q0600   citalopram   20 mg Oral Daily   enoxaparin  (LOVENOX ) injection  40 mg Subcutaneous Q24H   feeding supplement  237 mL Oral TID BM   guaiFENesin   600 mg Oral BID   ipratropium-albuterol   3 mL Nebulization Q6H   lidocaine   2 patch Transdermal Q24H   methylPREDNISolone  (SOLU-MEDROL ) injection  60 mg Intravenous Q12H   pantoprazole   40 mg Oral QPM   Continuous Infusions:  azithromycin  500 mg (03/20/24 1028)   cefTRIAXone  (ROCEPHIN )  IV 1 g (03/20/24 0913)    LOS: 3 days   Critical Care Procedure Note Authorized and Performed by: Olga Berthold MD  Total Critical Care time:  54 mins Due to a high probability  of clinically significant, life threatening deterioration, the patient required my highest level of preparedness to intervene emergently and I personally spent this critical care time directly and personally managing the patient.  This critical care time included obtaining a history; examining the patient, pulse oximetry; ordering and review of studies; arranging urgent treatment with development of a management plan; evaluation of patient's response of treatment; frequent reassessment; and discussions with other providers.  This critical care time was performed to assess and manage the high probability of imminent and life threatening deterioration that could result in multi-organ failure.  It was exclusive of separately billable procedures and treating other patients and teaching time.    Faustino Hook, MD How to contact the [facility] Attending or Consulting provider 7A - 7P or covering provider during after hours 7P -7A, for this patient?  Check the care team in [facility] and look for a) attending/consulting TRH provider listed and b) the TRH team listed Log into  [email or web address] to find provider on call.  Locate the [facility] provider you are looking for under [facility] and page to a number that you can be directly reached. If you still have difficulty reaching the provider, please page the [facility] (Director on Call) for the Hospitalists listed on amion for assistance.  03/20/2024, 11:40 AM

## 2024-03-21 DIAGNOSIS — J441 Chronic obstructive pulmonary disease with (acute) exacerbation: Secondary | ICD-10-CM | POA: Diagnosis not present

## 2024-03-21 DIAGNOSIS — F1220 Cannabis dependence, uncomplicated: Secondary | ICD-10-CM

## 2024-03-21 DIAGNOSIS — F411 Generalized anxiety disorder: Secondary | ICD-10-CM | POA: Diagnosis not present

## 2024-03-21 DIAGNOSIS — J9621 Acute and chronic respiratory failure with hypoxia: Secondary | ICD-10-CM | POA: Diagnosis not present

## 2024-03-21 DIAGNOSIS — E43 Unspecified severe protein-calorie malnutrition: Secondary | ICD-10-CM | POA: Diagnosis not present

## 2024-03-21 MED ORDER — GUAIFENESIN-DM 100-10 MG/5ML PO SYRP
5.0000 mL | ORAL_SOLUTION | ORAL | Status: DC | PRN
  Administered 2024-03-21 – 2024-03-22 (×5): 5 mL via ORAL
  Filled 2024-03-21 (×5): qty 5

## 2024-03-21 MED ORDER — FUROSEMIDE 10 MG/ML IJ SOLN
30.0000 mg | Freq: Once | INTRAMUSCULAR | Status: AC
Start: 2024-03-21 — End: 2024-03-21
  Administered 2024-03-21: 30 mg via INTRAVENOUS
  Filled 2024-03-21: qty 4

## 2024-03-21 NOTE — Plan of Care (Signed)

## 2024-03-21 NOTE — Plan of Care (Signed)

## 2024-03-21 NOTE — Progress Notes (Addendum)
 PROGRESS NOTE   Casey [name]  WUJ:[id number] DOB: 02-Jan-1961 DOA: 03/17/2024 PCP: Zarwolo, Gloria, FNP   Chief Complaint  Patient presents with   Shortness of Breath   Level of care: Stepdown  Brief Admission History:  63 year old female with history of polysubstance abuse, chronic respiratory failure on 2 L/min, COPD, chronic tobacco use, opioid dependence, benzodiazepine dependence, history of cocaine and THC use with history of frequent hospitalizations for pneumonia and decompensated COPD presented to the emergency department today with worsening dyspnea, also complaining of arthralgias, noted in the ED to have a higher than normal oxygen requirement of up to 5 L/min.  No known sick contacts.  Patient denies chest pain.  Patient reports productive cough with whitish sputum.  Patient reports severe DOE.  Patient also reports arthralgias and back pain.  Pt was noted to be hypokalemic and pneumonia noted on CXR with new oxygen requirement noted.  She was started on IV antibiotics and hospital admission was requested.     Assessment and Plan:  Acute on chronic respiratory failure with hypoxia Acute COPD exacerbation Community Acquired pneumonia  -- Pt admitted for IV antibiotics -- IV steroids and bronchodilators ordered around the clock -- supplemental oxygen as ordered  -- pulmonary toilet -- mucinex  600 mg BID  -- delsym  cough syrup  -- flutter valve and incentive spirometry  -- pt was transferred to stepdown ICU on 5/10 and started on bipap therapy due to acute decompensation and low partial pressure oxygen (pO2 47) -- IV lasix  given on 5/10, repeated dose on 5/11 and 5/13, follow up CXR shows improvement in interstitial edema pattern -- continue bipap therapy (PRN), pt NOT interested about getting a home NIV -- PT eval requested to start ambulating patient    Hypokalemia -- this was repleted -- Mg was 1.9, s/p IV 2 gram for goal Mg>2.0   Leukocytosis - Leukemoid reaction --  secondary to pneumonia and steroids  -- treating as above   Polysubstance abuse Opioid and benzodiazepine dependence -- follow up urine toxicology -- careful with controlled substances   Depression/GAD Severe Panic Episodes  -- resume home medications  -- symptoms exacerbated by steroids -- alprazolam 1mg  TID PRN has been the only thing that has controlled her severe panic attacks -- consider referral to behavioral health outpatient for ongoing management of this -- pt says she will speak with her PCP about continuing to prescribe alprazolam, pt says she had been purchasing the drug on the "streets" to get relief of anxiety symptoms  DVT prophylaxis: enoxaparin  Code Status: Full  Family Communication: bedside update on 5/10 Disposition: anticipating home in 1-2 days    Consultants:  N/a  Procedures:  N/a   Subjective: Pt says was off bipap for most of the night but unsure if she will still need the PRN bipap order as she used it yesterday during the daytime hours.  She remains short of breath but slowly improving,.  Severe anxiety is better on alprazolam TID which she had been purchasing on the streets prior to arrival due to severe dependence  Objective: Vitals:   03/21/24 0000 03/21/24 0749 03/21/24 0826 03/21/24 1145  BP: (!) 99/47  138/66   Pulse: 86  91   Resp: (!) 33  (!) 23   Temp:  97.7 F (36.5 C)  97.6 F (36.4 C)  TempSrc:  Oral  Axillary  SpO2: 98%  91%   Weight:      Height:        Intake/Output  Summary (Last 24 hours) at 03/21/2024 1227 Last data filed at 03/21/2024 0803 Gross per 24 hour  Intake 240 ml  Output --  Net 240 ml   Filed Weights   03/17/24 0835  Weight: 59 kg   Examination:  General exam: Appears calm and comfortable although anxious Respiratory system: diffuse rales heard. Tight lungs poor air movement, less frequent cough with yellow white sputum production, Mild increased work of breathing with tachypnea.  Cardiovascular system:  normal S1 & S2 heard. No JVD, murmurs, rubs, gallops or clicks. No pedal edema. Gastrointestinal system: Abdomen is nondistended, soft and nontender. No organomegaly or masses felt. Normal bowel sounds heard. Central nervous system: Alert and oriented. No focal neurological deficits. Extremities: Symmetric 5 x 5 power. Skin: No rashes, lesions or ulcers. Psychiatry: Judgement and insight appear normal. Mood & affect anxious.   Data Reviewed: I have personally reviewed following labs and imaging studies  CBC: Recent Labs  Lab 03/17/24 0842 03/17/24 1406 03/18/24 0512 03/19/24 0646  WBC 12.9* 8.9 11.9* 12.7*  NEUTROABS 9.1*  --  8.2*  --   HGB 11.9* 10.6* 10.7* 10.6*  HCT 35.7* 32.5* 32.2* 32.8*  MCV 89.3 89.8 90.4 92.4  PLT 399 329 346 376    Basic Metabolic Panel: Recent Labs  Lab 03/17/24 0842 03/17/24 1305 03/17/24 1406 03/18/24 0512 03/19/24 0646 03/20/24 0554  NA 134*  --   --  139 139 138  K 2.6* 3.3*  --  3.9 4.1 4.2  CL 95*  --   --  104 101 97*  CO2 29  --   --  28 29 32  GLUCOSE 160*  --   --  114* 185* 164*  BUN 6*  --   --  10 14 21   CREATININE 0.49  --  0.40* 0.39* 0.62 0.50  CALCIUM  8.7*  --   --  8.5* 8.9 8.9  MG  --   --   --  1.9 2.0  --     CBG: No results for input(s): "GLUCAP" in the last 168 hours.  Recent Results (from the past 240 hours)  Resp panel by RT-PCR (RSV, Flu A&B, Covid) Anterior Nasal Swab     Status: None   Collection Time: 03/17/24  8:43 AM   Specimen: Anterior Nasal Swab  Result Value Ref Range Status   SARS Coronavirus 2 by RT PCR NEGATIVE NEGATIVE Final    Comment: (NOTE) SARS-CoV-2 target nucleic acids are NOT DETECTED.  The SARS-CoV-2 RNA is generally detectable in upper respiratory specimens during the acute phase of infection. The lowest concentration of SARS-CoV-2 viral copies this assay can detect is 138 copies/mL. A negative result does not preclude SARS-Cov-2 infection and should not be used as the sole basis  for treatment or other patient management decisions. A negative result may occur with  improper specimen collection/handling, submission of specimen other than nasopharyngeal swab, presence of viral mutation(s) within the areas targeted by this assay, and inadequate number of viral copies(<138 copies/mL). A negative result must be combined with clinical observations, patient history, and epidemiological information. The expected result is Negative.  Fact Sheet for Patients:  BloggerCourse.com  Fact Sheet for Healthcare Providers:  SeriousBroker.it  This test is no t yet approved or cleared by the [place]  FDA and  has been authorized for detection and/or diagnosis of SARS-CoV-2 by FDA under an Emergency Use Authorization (EUA). This EUA will remain  in effect (meaning this test can be used) for the duration  of the COVID-19 declaration under Section 564(b)(1) of the Act, 21 U.S.C.section 360bbb-3(b)(1), unless the authorization is terminated  or revoked sooner.       Influenza A by PCR NEGATIVE NEGATIVE Final   Influenza B by PCR NEGATIVE NEGATIVE Final    Comment: (NOTE) The Xpert Xpress SARS-CoV-2/FLU/RSV plus assay is intended as an aid in the diagnosis of influenza from Nasopharyngeal swab specimens and should not be used as a sole basis for treatment. Nasal washings and aspirates are unacceptable for Xpert Xpress SARS-CoV-2/FLU/RSV testing.  Fact Sheet for Patients: BloggerCourse.com  Fact Sheet for Healthcare Providers: SeriousBroker.it  This test is not yet approved or cleared by the [place]  FDA and has been authorized for detection and/or diagnosis of SARS-CoV-2 by FDA under an Emergency Use Authorization (EUA). This EUA will remain in effect (meaning this test can be used) for the duration of the COVID-19 declaration under Section 564(b)(1) of the Act, 21  U.S.C. section 360bbb-3(b)(1), unless the authorization is terminated or revoked.     Resp Syncytial Virus by PCR NEGATIVE NEGATIVE Final    Comment: (NOTE) Fact Sheet for Patients: BloggerCourse.com  Fact Sheet for Healthcare Providers: SeriousBroker.it  This test is not yet approved or cleared by the [place]  FDA and has been authorized for detection and/or diagnosis of SARS-CoV-2 by FDA under an Emergency Use Authorization (EUA). This EUA will remain in effect (meaning this test can be used) for the duration of the COVID-19 declaration under Section 564(b)(1) of the Act, 21 U.S.C. section 360bbb-3(b)(1), unless the authorization is terminated or revoked.  Performed at [facility], [address], [place], [place] [address]   MRSA Next Gen by PCR, Nasal     Status: None   Collection Time: 03/18/24  4:40 PM   Specimen: Nasal Mucosa; Nasal Swab  Result Value Ref Range Status   MRSA by PCR Next Gen NOT DETECTED NOT DETECTED Final    Comment: (NOTE) The GeneXpert MRSA Assay (FDA approved for NASAL specimens only), is one component of a comprehensive MRSA colonization surveillance program. It is not intended to diagnose MRSA infection nor to guide or monitor treatment for MRSA infections. Test performance is not FDA approved in patients less than 29 years old. Performed at [facility], [address] [place], [place] [address]     Radiology Studies: No results found.  Scheduled Meds:  Chlorhexidine  Gluconate Cloth  6 each Topical Q0600   citalopram   20 mg Oral Daily   enoxaparin  (LOVENOX ) injection  40 mg Subcutaneous Q24H   feeding supplement  237 mL Oral TID BM   guaiFENesin   600 mg Oral BID   ipratropium-albuterol   3 mL Nebulization Q6H   lidocaine   2 patch Transdermal Q24H   methylPREDNISolone  (SOLU-MEDROL ) injection  60 mg Intravenous Q12H   pantoprazole   40 mg Oral QPM   Continuous Infusions:    LOS: 4  days   Critical Care Procedure Note Authorized and Performed by: Olga Berthold MD  Total Critical Care time:  55 mins Due to a high probability of clinically significant, life threatening deterioration, the patient required my highest level of preparedness to intervene emergently and I personally spent this critical care time directly and personally managing the patient.  This critical care time included obtaining a history; examining the patient, pulse oximetry; ordering and review of studies; arranging urgent treatment with development of a management plan; evaluation of patient's response of treatment; frequent reassessment; and discussions with other providers.  This critical care time  was performed to assess and manage the high probability of imminent and life threatening deterioration that could result in multi-organ failure.  It was exclusive of separately billable procedures and treating other patients and teaching time.    Faustino Hook, MD How to contact the [facility] Attending or Consulting provider 7A - 7P or covering provider during after hours 7P -7A, for this patient?  Check the care team in [facility] and look for a) attending/consulting TRH provider listed and b) the [facility] team listed Log into [email or web address] to find provider on call.  Locate the [facility] provider you are looking for under [facility] and page to a number that you can be directly reached. If you still have difficulty reaching the provider, please page the [facility] (Director on Call) for the Hospitalists listed on amion for assistance.  03/21/2024, 12:27 PM

## 2024-03-21 NOTE — Plan of Care (Signed)
  Problem: Acute Rehab PT Goals(only PT should resolve) Goal: Pt Will Go Supine/Side To Sit Outcome: Progressing Flowsheets (Taken 03/21/2024 1531) Pt will go Supine/Side to Sit: Independently Goal: Patient Will Transfer Sit To/From Stand Outcome: Progressing Flowsheets (Taken 03/21/2024 1531) Patient will transfer sit to/from stand: Independently Goal: Pt Will Transfer Bed To Chair/Chair To Bed Outcome: Progressing Flowsheets (Taken 03/21/2024 1531) Pt will Transfer Bed to Chair/Chair to Bed: Independently Goal: Pt Will Ambulate Outcome: Progressing Flowsheets (Taken 03/21/2024 1531) Pt will Ambulate:  15 feet  with modified independence  Independently Goal: Pt Will Go Up/Down Stairs Outcome: Progressing Flowsheets (Taken 03/21/2024 1531) Pt will Go Up / Down Stairs:  1-2 stairs  with modified independence  with rail(s)   3:32 PM, 03/21/24 Marysue Sola, PT, DPT [place] with [facility]

## 2024-03-21 NOTE — Evaluation (Signed)
 Physical Therapy Evaluation Patient Details Name: ABBIEGALE [name] MRN: [id number] DOB: [birth year] Today's Date: 03/21/2024  History of Present Illness  Casey [name] is a 64 year old female with history of polysubstance abuse, chronic respiratory failure on 2 L/min, COPD, chronic tobacco use, opioid dependence, benzodiazepine dependence, history of cocaine and THC use with history of frequent hospitalizations for pneumonia and decompensated COPD presented to the emergency department today with worsening dyspnea, also complaining of arthralgias, noted in the ED to have a higher than normal oxygen requirement of up to 5 L/min.  No known sick contacts.  Patient denies chest pain.  Patient reports productive cough with whitish sputum.  Patient reports severe DOE.  Patient also reports arthralgias and back pain.  Pt was noted to be hypokalemic and pneumonia noted on CXR with new oxygen requirement noted.  She was started on IV antibiotics and hospital admission was requested.   Clinical Impression  Patient agreeable to and tolerates PT evaluation well. Patient was received supine in bed with BiPAP donned. Therapist assist with doffing. Patient is able to replace [place] independently. Patient reports at baseline, she is (I) w/ all ADLs, iADLs, and ambulation in household with no use of AD. Reports she lives alone and does not do much walking outside of home. Reports she gets people to grocery shop for her. On this date, pt is mod(I) with bed mobility and CGA/sup with transfers and short distance ambulation. Patient reports transient dizziness and some unsteadiness with initial stand and first few steps. During mobility, Casey [name] is set at 3 Lpm with SpO2 fluctuating between 96-99%. HR increases to 113 bpm and pt demo some SOB and dyspnea. Patient reports being near her baseline. Patient will benefit from continued skilled physical therapy acutely and with recommended venue when medically stable in order to return to PLOF  and improve function/QOL.        If plan is discharge home, recommend the following: Assist for transportation;A little help with walking and/or transfers   Can travel by private vehicle        Equipment Recommendations None recommended by PT  Recommendations for Other Services       Functional Status Assessment Patient has had a recent decline in their functional status and demonstrates the ability to make significant improvements in function in a reasonable and predictable amount of time.     Precautions / Restrictions Precautions Precautions: Fall Restrictions Weight Bearing Restrictions Per Provider Order: No      Mobility  Bed Mobility Overal bed mobility: Modified Independent       General bed mobility comments: HOB elevated. Slow, labored movement. No physical assist    Transfers Overall transfer level: Needs assistance Equipment used: 1 person hand held assist Transfers: Sit to/from Stand, Bed to chair/wheelchair/BSC Sit to Stand: Modified independent (Device/Increase time), Contact guard assist   Step pivot transfers: Contact guard assist, Supervision       General transfer comment: STS with slow labored movement, pt reporting transient dizziness, Mild unsteadiness to note but no AD or physical A, CGA for safety. Bed>chair step pivot w/ one HHA initially, prog to CGA/Sup.    Ambulation/Gait Ambulation/Gait assistance: Contact guard assist, Supervision Gait Distance (Feet): 10 Feet Assistive device: 1 person hand held assist Gait Pattern/deviations: Step-through pattern, Decreased step length - right, Decreased step length - left, Decreased stride length Gait velocity: Dec     General Gait Details: Pt ambulates in room with one HHA initially, prog to CGA/Sup with pt attempting to  manage lines. V cues for focusing on steps and breathing to correct.  Stairs      Wheelchair Mobility     Tilt Bed    Modified [place] (Stroke Patients Only)        Balance Overall balance assessment: Mild deficits observed, not formally tested (Pt unsteady with initially with STS and first few steps, reporting dizziness that resolves.)             Pertinent Vitals/Pain Pain Assessment Pain Assessment: 0-10 Pain Score: 7  Pain Location: Low back Pain Intervention(s): Limited activity within patient's tolerance, Monitored during session, Repositioned    Home Living Family/patient expects to be discharged to:: Private residence Living Arrangements: Alone Available Help at Discharge: Family;Available PRN/intermittently;Friend(s) Type of Home: House Home Access: Stairs to enter   Entrance Stairs-Number of Steps: 1   Home Layout: One level Home Equipment: Agricultural consultant (2 wheels) Additional Comments: Pt reports she now lives alone.    Prior Function Prior Level of Function : Independent/Modified Independent     Mobility Comments: Reports as household ambulator without AD, and doesn't do much ambulating outside of home. Has RW but does not use ADLs Comments: Independent     Extremity/Trunk Assessment   Upper Extremity Assessment Upper Extremity Assessment: Generalized weakness    Lower Extremity Assessment Lower Extremity Assessment: Generalized weakness (4/5 LE strength bilaterally. Pt reporting R side as stronger than L. Adequate for functional transfers adn ambulation)    Cervical / Trunk Assessment Cervical / Trunk Assessment: Normal  Communication   Communication Communication: No apparent difficulties    Cognition Arousal: Alert Behavior During Therapy: WFL for tasks assessed/performed   PT - Cognitive impairments: No apparent impairments         Following commands: Intact       Cueing Cueing Techniques: Verbal cues     General Comments      Exercises     Assessment/Plan    PT Assessment Patient needs continued PT services  PT Problem List Decreased strength;Decreased activity tolerance;Decreased  balance;Decreased mobility;Pain       PT Treatment Interventions Gait training;Stair training;Functional mobility training;Therapeutic activities;Therapeutic exercise;Balance training;Patient/family education    PT Goals (Current goals can be found in the Care Plan section)  Acute Rehab PT Goals Patient Stated Goal: Return home with HHPT services PT Goal Formulation: With patient Time For Goal Achievement: 03/24/24 Potential to Achieve Goals: Good    Frequency Min 3X/week     Co-evaluation               AM-PAC PT "6 Clicks" Mobility  Outcome Measure Help needed turning from your back to your side while in a flat bed without using bedrails?: A Little Help needed moving from lying on your back to sitting on the side of a flat bed without using bedrails?: A Little Help needed moving to and from a bed to a chair (including a wheelchair)?: A Little Help needed standing up from a chair using your arms (e.g., wheelchair or bedside chair)?: A Little Help needed to walk in hospital room?: A Little Help needed climbing 3-5 steps with a railing? : A Little 6 Click Score: 18    End of Session   Activity Tolerance: Patient tolerated treatment well Patient left: in chair;with call bell/phone within reach Nurse Communication: Mobility status PT Visit Diagnosis: Dizziness and giddiness (R42);Muscle weakness (generalized) (M62.81);Unsteadiness on feet (R26.81);Other abnormalities of gait and mobility (R26.89);Pain Pain - part of body:  (Low back)    Time:  7846-9629 PT Time Calculation (min) (ACUTE ONLY): 24 min   Charges:   PT Evaluation $PT Eval Moderate Complexity: 1 Mod PT Treatments $Therapeutic Activity: 23-37 mins PT General Charges $$ ACUTE PT VISIT: 1 Visit         3:29 PM, 03/21/24 Daron Stutz Powell-Butler, PT, DPT [place] with [facility]

## 2024-03-22 DIAGNOSIS — Z720 Tobacco use: Secondary | ICD-10-CM | POA: Diagnosis not present

## 2024-03-22 DIAGNOSIS — J9621 Acute and chronic respiratory failure with hypoxia: Secondary | ICD-10-CM | POA: Diagnosis not present

## 2024-03-22 DIAGNOSIS — J441 Chronic obstructive pulmonary disease with (acute) exacerbation: Secondary | ICD-10-CM | POA: Diagnosis not present

## 2024-03-22 LAB — CBC
HCT: 33.1 % — ABNORMAL LOW (ref 36.0–46.0)
Hemoglobin: 10.9 g/dL — ABNORMAL LOW (ref 12.0–15.0)
MCH: 29.5 pg (ref 26.0–34.0)
MCHC: 32.9 g/dL (ref 30.0–36.0)
MCV: 89.7 fL (ref 80.0–100.0)
Platelets: 446 10*3/uL — ABNORMAL HIGH (ref 150–400)
RBC: 3.69 MIL/uL — ABNORMAL LOW (ref 3.87–5.11)
RDW: 12.4 % (ref 11.5–15.5)
WBC: 8.5 10*3/uL (ref 4.0–10.5)
nRBC: 0 % (ref 0.0–0.2)

## 2024-03-22 LAB — BASIC METABOLIC PANEL WITH GFR
Anion gap: 5 (ref 5–15)
BUN: 16 mg/dL (ref 8–23)
CO2: 33 mmol/L — ABNORMAL HIGH (ref 22–32)
Calcium: 8.3 mg/dL — ABNORMAL LOW (ref 8.9–10.3)
Chloride: 100 mmol/L (ref 98–111)
Creatinine, Ser: 0.5 mg/dL (ref 0.44–1.00)
GFR, Estimated: >60 mL/min (ref >60)
Glucose, Bld: 189 mg/dL — ABNORMAL HIGH (ref 70–99)
Potassium: 3.9 mmol/L (ref 3.5–5.1)
Sodium: 138 mmol/L (ref 135–145)

## 2024-03-22 LAB — MAGNESIUM: Magnesium: 2.2 mg/dL (ref 1.7–2.4)

## 2024-03-22 MED ORDER — HYDROXYZINE HCL 50 MG PO TABS: 50.0000 mg | ORAL_TABLET | Freq: Three times a day (TID) | ORAL | 1 refills | Status: DC | PRN

## 2024-03-22 MED ORDER — PREDNISONE 10 MG PO TABS: 60.0000 mg | ORAL_TABLET | Freq: Every day | ORAL | 0 refills | Status: DC

## 2024-03-22 MED ORDER — PREDNISONE 20 MG PO TABS: 60.0000 mg | ORAL_TABLET | Freq: Every day | ORAL | Status: DC

## 2024-03-22 MED ORDER — CITALOPRAM HYDROBROMIDE 20 MG PO TABS: 20.0000 mg | ORAL_TABLET | Freq: Every day | ORAL | 3 refills | Status: DC

## 2024-03-22 NOTE — Discharge Summary (Signed)
 Physician Discharge Summary   Patient: Casey [name] MRN: [id number] DOB: [birth year]  Admit date:     03/17/2024  Discharge date: 03/22/24  Discharge Physician: Myrtie Atkinson Jourdin Connors   PCP: Zarwolo, Gloria, FNP   Recommendations at discharge:   Please follow up with primary care provider within 1-2 weeks  Please repeat BMP and CBC in one week    Hospital Course: 63 year old female with history of polysubstance abuse, chronic respiratory failure on 2 L/min, COPD, chronic tobacco use, history of taking opioids and benzodiazepines that have not been prescribed history of cocaine and THC use with history of frequent hospitalizations for pneumonia and decompensated COPD presented to the emergency department with worsening dyspnea, also complaining of arthralgias, noted in the ED to have a higher than normal oxygen requirement of up to 5 L/min.  No known sick contacts.  Patient denies chest pain.  Patient reports productive cough with whitish sputum.  Patient reports severe DOE.  Patient also reports arthralgias and back pain.  Pt was noted to be hypokalemic and pneumonia noted on CXR with new oxygen requirement noted.  She was started on IV antibiotics and hospital admission was requested.   She was started on IV Solu-Medrol  and bronchodilators with gradual improvement.  Assessment and Plan: Acute on chronic respiratory failure with hypoxia Acute COPD exacerbation Community Acquired pneumonia  -- Pt admitted for IV antibiotics--finished 5 days CTX and azithro -- IV steroids and bronchodilators ordered around the clock -- supplemental oxygen as ordered  -  weaned back to baseline 2L at time of d/c -- pulmonary toilet -- mucinex  600 mg BID  -- delsym  cough syrup  -- flutter valve and incentive spirometry  -- pt was transferred to stepdown ICU on 5/10 and started on bipap therapy due to acute decompensation and low partial pressure oxygen (pO2 47) -- IV lasix  given on 5/10, repeated dose on 5/11 and 5/13,  follow up CXR shows improvement in interstitial edema pattern -- continue bipap therapy (PRN), pt NOT interested about getting a home NIV -- weaned off BiPAP -- d/c home with prednisone  taper   Hypokalemia -- repleted -- Mg was 1.9>>2.2   Leukocytosis - Leukemoid reaction -- secondary to pneumonia and steroids  -- treating as above   Polysubstance abuse Opioid and benzodiazepine dependence -- follow up urine toxicology -- UDS + opioid and benzo -- tobacco cessation discussed   Depression/GAD -- resume home medications  -- symptoms exacerbated by steroids -- alprazolam 1mg  TID PRN has been the only thing that has controlled her severe panic attacks -- consider referral to behavioral health outpatient for ongoing management of this -- pt says she will speak with her PCP about continuing to prescribe alprazolam, pt says she had been purchasing the drug on the "streets" to get relief of anxiety symptoms   Hyperglycemia / Prediabetes mellitus - suspect this is steroid induced - 08/26/23 A1c --> 6.0%        Consultants: none Procedures performed: none  Disposition: Home Diet recommendation:  Carb modified diet DISCHARGE MEDICATION: Allergies as of 03/22/2024   No Known Allergies      Medication List     STOP taking these medications    traZODone  50 MG tablet Commonly known as: DESYREL        TAKE these medications    acetaminophen  325 MG tablet Commonly known as: TYLENOL  Take 2 tablets (650 mg total) by mouth every 6 (six) hours as needed for mild pain (or Fever >/= 101).   albuterol   108 (90 Base) MCG/ACT inhaler Commonly known as: VENTOLIN  HFA Inhale 2 puffs into the lungs every 6 (six) hours as needed for wheezing or shortness of breath.   citalopram  20 MG tablet Commonly known as: CELEXA  Take 1 tablet (20 mg total) by mouth daily.   feeding supplement Liqd Take 237 mLs by mouth 3 (three) times daily between meals.   hydrOXYzine  50 MG  tablet Commonly known as: ATARAX  Take 1 tablet (50 mg total) by mouth 3 (three) times daily as needed for anxiety.   ipratropium-albuterol  0.5-2.5 (3) MG/3ML Soln Commonly known as: DUONEB Take 3 mLs by nebulization every 6 (six) hours as needed.   methocarbamol  500 MG tablet Commonly known as: ROBAXIN  Take 500 mg by mouth every 8 (eight) hours as needed for muscle spasms.   predniSONE  10 MG tablet Commonly known as: DELTASONE  Take 6 tablets (60 mg total) by mouth daily with breakfast. And decrease by one tablet daily Start taking on: Mar 23, 2024        Discharge Exam: Cleavon Curls Weights   03/17/24 0835  Weight: 59 kg   HEENT:  [place]/AT, No thrush, no icterus CV:  RRR, no rub, no S3, no S4 Lung:  diminished BS.  No wheeze.  Bibasilar rales Abd:  soft/+BS, NT Ext:  No edema, no lymphangitis, no synovitis, no rash   Condition at discharge: stable  The results of significant diagnostics from this hospitalization (including imaging, microbiology, ancillary and laboratory) are listed below for reference.   Imaging Studies: DG CHEST PORT 1 VIEW Result Date: 03/19/2024 CLINICAL DATA:  Acute on chronic respiratory failure and hypoxia EXAM: PORTABLE CHEST 1 VIEW COMPARISON:  03/18/2024 FINDINGS: Heart size and mediastinal contours are stable. Diffuse chronic coarsened interstitial markings with bronchial wall thickening is again noted. Interstitial edema pattern has decreased from the previous exam. No pleural effusion or pneumothorax. Multiple chronic remote left rib fractures. Status post left shoulder arthroplasty. IMPRESSION: 1. Improvement in interstitial edema pattern. Electronically Signed   By: Kimberley Penman M.D.   On: 03/19/2024 09:41   DG CHEST PORT 1 VIEW Result Date: 03/18/2024 CLINICAL DATA:  Acute respiratory failure with hypoxia. Shortness of breath. EXAM: PORTABLE CHEST 1 VIEW COMPARISON:  Chest radiograph yesterday FINDINGS: The heart is stable in size. Unchanged  mediastinal contours. Worsening ill-defined opacities throughout the left lung, with developing ill-defined right perihilar opacity. No pneumothorax or significant pleural effusion. Remote left rib fractures. IMPRESSION: Worsening ill-defined opacities throughout the left lung, with developing ill-defined right perihilar opacity. Findings may represent asymmetric edema or infection, including atypical organisms. Electronically Signed   By: Chadwick Colonel M.D.   On: 03/18/2024 16:33   DG Chest Port 1 View Result Date: 03/17/2024 CLINICAL DATA:  Shortness of breath EXAM: PORTABLE CHEST 1 VIEW COMPARISON:  Chest radiograph 11/06/2023 FINDINGS: Patient is rotated to the left. Monitoring leads overlie the patient. Stable cardiac and mediastinal contours. Increased heterogeneous opacities left mid and lower lung. Persistent heterogeneous opacities right lung base. Biapical pleural-parenchymal thickening. No pleural effusion or pneumothorax. IMPRESSION: Increased heterogeneous opacities left mid and lower lung may represent infection in the appropriate clinical setting. Followup PA and lateral chest X-ray is recommended in 3-4 weeks following trial of antibiotic therapy to ensure resolution and exclude underlying malignancy. Electronically Signed   By: Jone Neither M.D.   On: 03/17/2024 10:09    Microbiology: Results for orders placed or performed during the hospital encounter of 03/17/24  Resp panel by RT-PCR (RSV, Flu A&B, Covid) Anterior  Nasal Swab     Status: None   Collection Time: 03/17/24  8:43 AM   Specimen: Anterior Nasal Swab  Result Value Ref Range Status   SARS Coronavirus 2 by RT PCR NEGATIVE NEGATIVE Final    Comment: (NOTE) SARS-CoV-2 target nucleic acids are NOT DETECTED.  The SARS-CoV-2 RNA is generally detectable in upper respiratory specimens during the acute phase of infection. The lowest concentration of SARS-CoV-2 viral copies this assay can detect is 138 copies/mL. A negative  result does not preclude SARS-Cov-2 infection and should not be used as the sole basis for treatment or other patient management decisions. A negative result may occur with  improper specimen collection/handling, submission of specimen other than nasopharyngeal swab, presence of viral mutation(s) within the areas targeted by this assay, and inadequate number of viral copies(<138 copies/mL). A negative result must be combined with clinical observations, patient history, and epidemiological information. The expected result is Negative.  Fact Sheet for Patients:  BloggerCourse.com  Fact Sheet for Healthcare Providers:  SeriousBroker.it  This test is no t yet approved or cleared by the [place]  FDA and  has been authorized for detection and/or diagnosis of SARS-CoV-2 by FDA under an Emergency Use Authorization (EUA). This EUA will remain  in effect (meaning this test can be used) for the duration of the COVID-19 declaration under Section 564(b)(1) of the Act, 21 U.S.C.section 360bbb-3(b)(1), unless the authorization is terminated  or revoked sooner.       Influenza A by PCR NEGATIVE NEGATIVE Final   Influenza B by PCR NEGATIVE NEGATIVE Final    Comment: (NOTE) The Xpert Xpress SARS-CoV-2/FLU/RSV plus assay is intended as an aid in the diagnosis of influenza from Nasopharyngeal swab specimens and should not be used as a sole basis for treatment. Nasal washings and aspirates are unacceptable for Xpert Xpress SARS-CoV-2/FLU/RSV testing.  Fact Sheet for Patients: BloggerCourse.com  Fact Sheet for Healthcare Providers: SeriousBroker.it  This test is not yet approved or cleared by the [place]  FDA and has been authorized for detection and/or diagnosis of SARS-CoV-2 by FDA under an Emergency Use Authorization (EUA). This EUA will remain in effect (meaning this test can be used)  for the duration of the COVID-19 declaration under Section 564(b)(1) of the Act, 21 U.S.C. section 360bbb-3(b)(1), unless the authorization is terminated or revoked.     Resp Syncytial Virus by PCR NEGATIVE NEGATIVE Final    Comment: (NOTE) Fact Sheet for Patients: BloggerCourse.com  Fact Sheet for Healthcare Providers: SeriousBroker.it  This test is not yet approved or cleared by the [place]  FDA and has been authorized for detection and/or diagnosis of SARS-CoV-2 by FDA under an Emergency Use Authorization (EUA). This EUA will remain in effect (meaning this test can be used) for the duration of the COVID-19 declaration under Section 564(b)(1) of the Act, 21 U.S.C. section 360bbb-3(b)(1), unless the authorization is terminated or revoked.  Performed at [facility], [address] [place], [place] [address]   MRSA Next Gen by PCR, Nasal     Status: None   Collection Time: 03/18/24  4:40 PM   Specimen: Nasal Mucosa; Nasal Swab  Result Value Ref Range Status   MRSA by PCR Next Gen NOT DETECTED NOT DETECTED Final    Comment: (NOTE) The GeneXpert MRSA Assay (FDA approved for NASAL specimens only), is one component of a comprehensive MRSA colonization surveillance program. It is not intended to diagnose MRSA infection nor to guide or monitor treatment for MRSA infections. Test performance is  not FDA approved in patients less than 74 years old. Performed at [facility], [address] [place], [place] [address]     Labs: CBC: Recent Labs  Lab 03/17/24 [phone] 03/17/24 1406 03/18/24 0512 03/19/24 0646 03/22/24 0432  WBC 12.9* 8.9 11.9* 12.7* 8.5  NEUTROABS 9.1*  --  8.2*  --   --   HGB 11.9* 10.6* 10.7* 10.6* 10.9*  HCT 35.7* 32.5* 32.2* 32.8* 33.1*  MCV 89.3 89.8 90.4 92.4 89.7  PLT 399 329 346 376 446*   Basic Metabolic Panel: Recent Labs  Lab 03/17/24 0842 03/17/24 1305 03/17/24 1406 03/18/24 0512  03/19/24 0646 03/20/24 0554 03/22/24 0432  NA 134*  --   --  139 139 138 138  K 2.6* 3.3*  --  3.9 4.1 4.2 3.9  CL 95*  --   --  104 101 97* 100  CO2 29  --   --  28 29 32 33*  GLUCOSE 160*  --   --  114* 185* 164* 189*  BUN 6*  --   --  10 14 21 16   CREATININE 0.49  --  0.40* 0.39* 0.62 0.50 0.50  CALCIUM  8.7*  --   --  8.5* 8.9 8.9 8.3*  MG  --   --   --  1.9 2.0  --  2.2   Liver Function Tests: Recent Labs  Lab 03/17/24 0842  AST 8*  ALT 9  ALKPHOS 90  BILITOT 0.5  PROT 6.7  ALBUMIN 3.2*   CBG: No results for input(s): "GLUCAP" in the last 168 hours.  Discharge time spent: greater than 30 minutes.  Signed: Demaris Fillers, MD [facility] 03/22/2024

## 2024-03-22 NOTE — Plan of Care (Signed)

## 2024-03-22 NOTE — Progress Notes (Signed)
 Reviewed discharge paperwork with patient. IV removed from both arms. Oxygen set for 3L on portable tank. Family picked patient up.

## 2024-03-23 ENCOUNTER — Telehealth: Payer: Self-pay

## 2024-03-23 NOTE — Transitions of Care (Post Inpatient/ED Visit) (Signed)
   03/23/2024  Name: Casey [name] MRN: [id number] DOB: Oct 15, 1961  Today's TOC FU Call Status: Today's TOC FU Call Status:: Unsuccessful Call (1st Attempt) Unsuccessful Call (1st Attempt) Date: 03/23/24  Attempted to reach the patient regarding the most recent Inpatient/ED visit.  Follow Up Plan: Additional outreach attempts will be made to reach the patient to complete the Transitions of Care (Post Inpatient/ED visit) call.   Orpha Blade, RN, BSN, CEN [employer]- Transition of Care Team.  [facility] [phone]

## 2024-03-24 ENCOUNTER — Telehealth: Payer: Self-pay

## 2024-03-24 NOTE — Transitions of Care (Post Inpatient/ED Visit) (Signed)
   03/24/2024  Name: Casey [name] MRN: [id number] DOB: 24-Aug-1961  Today's TOC FU Call Status: Today's TOC FU Call Status:: Successful TOC FU Call Completed TOC FU Call Complete Date: 03/24/24 Patient's Name and Date of Birth confirmed.  Transition Care Management Follow-up Telephone Call How have you been since you were released from the hospital?: Worse (reports worsening shortness of breath)  Items Reviewed:Patient is struggling to catch her breathe. I was going to call 911 but patient states she will call right now.    Call ended.     Medications Reviewed Today: Medications Reviewed Today     Reviewed by Vanetta Generous, RN (Registered Nurse) on 03/24/24 at 1454  Med List Status: <None>   Medication Order Taking? Sig Documenting Provider Last Dose Status Informant  acetaminophen  (TYLENOL ) 325 MG tablet [id number] No Take 2 tablets (650 mg total) by mouth every 6 (six) hours as needed for mild pain (or Fever >/= 101).  Patient not taking: Reported on 03/24/2024   Colin Dawley, MD Not Taking Active Self  albuterol  (VENTOLIN  HFA) 108 (90 Base) MCG/ACT inhaler [id number] No Inhale 2 puffs into the lungs every 6 (six) hours as needed for wheezing or shortness of breath.  Patient not taking: Reported on 03/24/2024   Colin Dawley, MD Not Taking Active Self  citalopram  (CELEXA ) 20 MG tablet [id number]  Take 1 tablet (20 mg total) by mouth daily. Demaris Fillers, MD  Active   feeding supplement (ENSURE ENLIVE / ENSURE PLUS) LIQD [id number] No Take 237 mLs by mouth 3 (three) times daily between meals.  Patient not taking: Reported on 03/17/2024   Colin Dawley, MD Not Taking Active Self           Med Note Author Board, Aneta Keepers   Fri Mar 17, 2024  2:49 PM) Pt stated she has not had the money to be able to get these  hydrOXYzine  (ATARAX ) 50 MG tablet [id number]  Take 1 tablet (50 mg total) by mouth 3 (three) times daily as needed for anxiety. Demaris Fillers, MD  Active   ipratropium-albuterol  (DUONEB)  0.5-2.5 (3) MG/3ML SOLN [id number] Yes Take 3 mLs by nebulization every 6 (six) hours as needed. Diamond Formica, MD Taking Active Self  methocarbamol  (ROBAXIN ) 500 MG tablet [id number] Yes Take 500 mg by mouth every 8 (eight) hours as needed for muscle spasms. [provider] Taking Active Self  predniSONE  (DELTASONE ) 10 MG tablet [id number]  Take 6 tablets (60 mg total) by mouth daily with breakfast. And decrease by one tablet daily Tat, Myrtie Atkinson, MD  Active            Call ended and patient called 911.  Refused for me to call and states she will call herself.  She does not have her medications.  Assessment not completed. Will follow up on Monday. 03/27/2024  Orpha Blade, RN, BSN, CEN [facility]- Transition of Care Team.  [facility] [phone]
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
## 2024-03-27 ENCOUNTER — Telehealth: Payer: Self-pay

## 2024-03-27 ENCOUNTER — Ambulatory Visit (INDEPENDENT_AMBULATORY_CARE_PROVIDER_SITE_OTHER): Payer: MEDICAID | Admitting: Family Medicine

## 2024-03-27 ENCOUNTER — Encounter: Payer: Self-pay | Admitting: Family Medicine

## 2024-03-27 ENCOUNTER — Ambulatory Visit: Payer: Self-pay | Admitting: Internal Medicine

## 2024-03-27 VITALS — BP 153/80 | HR 75 | Resp 19 | Ht 65.0 in | Wt 118.1 lb

## 2024-03-27 DIAGNOSIS — J189 Pneumonia, unspecified organism: Secondary | ICD-10-CM | POA: Insufficient documentation

## 2024-03-27 DIAGNOSIS — F410 Panic disorder [episodic paroxysmal anxiety] without agoraphobia: Secondary | ICD-10-CM | POA: Insufficient documentation

## 2024-03-27 MED ORDER — ALPRAZOLAM 0.25 MG PO TABS
0.2500 mg | ORAL_TABLET | ORAL | 0 refills | Status: DC | PRN
Start: 2024-03-27 — End: 2024-03-30

## 2024-03-27 NOTE — Telephone Encounter (Signed)
 Patient called back stating she needs to keep her appointment tomorrow. I advised her that her appointment is still scheduled tomorrow. Patient gasping for breath while speaking to me. Patient advised she should go to the ED or urgent care today for treatment. Patient verbalized understanding and then ended the call stating someone was calling her.

## 2024-03-27 NOTE — Assessment & Plan Note (Signed)
 Pending results of the CBC and BMP. The patient is encouraged to complete the remaining course of her steroid taper and follow up with pulmonology as scheduled. She is currently requiring 2 liters of supplemental oxygen on room air. A repeat chest X-ray is planned in 6 weeks to assess for resolution of pneumonia.

## 2024-03-27 NOTE — Telephone Encounter (Signed)
 Chief Complaint: SOB Symptoms: wheezing, anxiety attack Frequency: x 2 weeks Pertinent Negatives: Patient denies current fever, CP, severe SOB Disposition: [] ED /[x] Urgent Care (no appt availability in office) / [] Appointment(In office/virtual)/ []  [facility]/ [] Home Care/ [x] Refused Recommended Disposition /[] [place] [facility]/ []  Follow-up with PCP Additional Notes: Pt c/o SOB, wheezing and anxiety attacks secondary to SOB/wheezing. Pt requesting "nerve pills" from Casey [name]. Pt denies any fever, URI sx, CP, or severe SOB. Pt endorses recent hospitalization for PNA, and has not returned back to baseline since then. [facility] does appreciate mild SOB during call. Pt is speaking in full sentences. Pt reports taking DuoNeb as instructed. Tri[facility] noted pt has appt with [facility] tomorrow, and pt unsure if able to make appt. Triager strongly reinforced keeping appt for further evaluation. Triager will forward encounter for Casey. Waymond [name] 's office to review and advise. Patient verbalized understanding and is expecting call back from office for next steps/if Casey. Waymond [name] able to prescribe Rx. [facility] also advised that if pt does not hear back from office, to follow disposition for further evaluation/treatment. Patient verbalized understanding and to call back/911 with worsening symptoms.      Copied from CRM [id number]. Topic: Clinical - Red Word Triage >> Mar 27, 2024  9:47 AM Casey [name] wrote: Red Word that prompted transfer to Nurse Triage: SOB, I can hardly breathe. Can't get ride to appt tomorrow. Forgot to call [facility] Having panic attack due to not breathing. Reason for Disposition  [1] MILD difficulty breathing (e.g., minimal/no SOB at rest, SOB with walking, pulse <100) AND [2] NEW-onset or WORSE than normal  Answer Assessment - Initial Assessment Questions E2C2 Pulmonary Triage - Initial Assessment Questions "Chief Complaint (e.g., cough, sob, wheezing, fever, chills, sweat or additional  symptoms) *Go to specific symptom protocol after initial questions. SOB Reports PNA hospitalization last week, "I need meds" for panic attacks with SOB  "How long have symptoms been present?" X 2 weeks  Have you tested for COVID or Flu? Note: If not, ask patient if a home test can be taken. If so, instruct patient to call back for positive results. No  MEDICINES:   "Have you used any OTC meds to help with symptoms?" No If yes, ask "What medications?" N/a  "Have you used your inhalers/maintenance medication?" Yes If yes, "What medications?" DuoNeb - reports 4x a day - provides some relief  If inhaler, ask "How many puffs and how often?" Note: Review instructions on medication in the chart. See above  OXYGEN: "Do you wear supplemental oxygen?" Yes If yes, "How many liters are you supposed to use?" 2L ATC  "Do you monitor your oxygen levels?" Yes If yes, "What is your reading (oxygen level) today?" 96 at rest  "What is your usual oxygen saturation reading?"  (Note: Pulmonary O2 sats should be 90% or greater) 97-98   1. RESPIRATORY STATUS: "Describe your breathing?" (e.g., wheezing, shortness of breath, unable to speak, severe coughing)      SOB, wheezing 2. ONSET: "When did this breathing problem begin?"      X 2 weeks 3. PATTERN "Does the difficult breathing come and go, or has it been constant since it started?"      constant 4. SEVERITY: "How bad is your breathing?" (e.g., mild, moderate, severe)    - MILD: No SOB at rest, mild SOB with walking, speaks normally in sentences, can lie down, no retractions, pulse < 100.    - MODERATE: SOB at rest, SOB with  minimal exertion and prefers to sit, cannot lie down flat, speaks in phrases, mild retractions, audible wheezing, pulse 100-120.    - SEVERE: Very SOB at rest, speaks in single words, struggling to breathe, sitting hunched forward, retractions, pulse > 120      Mild- moderate  5. RECURRENT SYMPTOM: "Have you had  difficulty breathing before?" If Yes, ask: "When was the last time?" and "What happened that time?"      Yes, prednisone  6. CARDIAC HISTORY: "Do you have any history of heart disease?" (e.g., heart attack, angina, bypass surgery, angioplasty)      denies 7. LUNG HISTORY: "Do you have any history of lung disease?"  (e.g., pulmonary embolus, asthma, emphysema)     COPD 8. CAUSE: "What do you think is causing the breathing problem?"      Anxiety, COPD 9. OTHER SYMPTOMS: "Do you have any other symptoms? (e.g., dizziness, runny nose, cough, chest pain, fever)     Dry cough above baseline Fever last night  Protocols used: Breathing Difficulty-A-AH

## 2024-03-27 NOTE — Assessment & Plan Note (Addendum)
 Pending results of CBC and BMP. The patient is encouraged to complete the remaining course of her steroid taper and to follow up with pulmonology as scheduled. She is currently on 2 liters of supplemental oxygen while on room air in no acute distress. A repeat chest X-ray will be obtained in 6 weeks to assess for pneumonia clearance.

## 2024-03-27 NOTE — Telephone Encounter (Signed)
 Per recent hosp admission note: Hospital Course: 63 year old female with history of polysubstance abuse, chronic respiratory failure on 2 L/min, COPD, chronic tobacco use, history of taking opioids and benzodiazepines that have not been prescribed history of cocaine and THC use with history of frequent hospitalizations for pneumonia and decompensated COPD  (POSITIVE DRUG SCREEN 03/17/24)  We cannot offer any treatment for anxiety. Patient will need to contact PCP for assistance with this concern.   Spoke with patient and advised. She has OV scheduled with PCP today. She started conversation with sounds of very shallow breathing, then over course of conversation her breathing changed to a more normal rate. While explaining reasoning of need for her to contact PCP [id number] meds she states she has tried this before and has been refused. I recommended she ask for psychiatry referral if PCP unable to provide rx. She agrees to this. She continued to breathe at a normal rate; when asked what her current O2 levels are she replied "in the 90's". I advised her these sats do not require ED visit and she can wait to see PCP later today. She voiced understanding. Nothing further needed at this time.

## 2024-03-27 NOTE — Progress Notes (Signed)
 Established Patient Office Visit  Subjective:  Patient ID: Casey [name], female    DOB: August 04, 1961  Age: 63 y.o. MRN: [id number]  CC:  Chief Complaint  Patient presents with   Hospitalization Follow-up    5/9-5/14 acute COPD and Pneumonia. Repeating that she needs anxiety meds because she is having panic attacks from the SOB    HPI Casey [name] Jobson is a 63 y.o. female with past medical history of polysubstance abuse, chronic respiratory failure on 2 L/min, COPD, chronic tobacco use, opioid dependence, benzodiazepine dependence, history of cocaine and THC use with history of frequent hospitalizations for pneumonia and decompensated COPD  presents for hospital f/u. For the details of today's visit, please refer to the assessment and plan.    Community acquired pneumonia:The patient was previously treated for community-acquired pneumonia with IV antibiotics, IV steroids, and bronchodilators after presenting to the emergency department with worsening dyspnea and requiring increased oxygen support up to 5 liters per minute. She was discharged home on a steroid taper, with two days of medication remaining. Currently, she reports increased coughing with clear phlegm, shortness of breath, and wheezing. She denies experiencing fever or chills. The patient is scheduled to follow up with pulmonology on Mar 28, 2024.   Past Medical History:  Diagnosis Date   Anxiety    Chronic back pain    COPD (chronic obstructive pulmonary disease) (HCC)    Depression    History of panic attacks 09/11/2014   Medical history non-contributory    Pneumonia    Polysubstance abuse (HCC)    History of taking non-prescribed opiates, BZDs; also +cocaine and THC   Ruptured disc, thoracic    Suicide attempt (HCC) 2002    Past Surgical History:  Procedure Laterality Date   CESAREAN SECTION     CLAVICLE SURGERY     REVERSE SHOULDER ARTHROPLASTY Left 01/01/2020   Procedure: LEFT REVERSE SHOULDER ARTHROPLASTY;   Surgeon: Jasmine Mesi, MD;  Location: [facility] OR;  Service: Orthopedics;  Laterality: Left;   TUBAL LIGATION      Family History  Problem Relation Age of Onset   Diabetes Mother    Pulmonary fibrosis Mother    Heart attack Father    Parkinson's disease Sister    Bipolar disorder Sister    Thyroid  disease Sister    Heart attack Brother    Other Son        vascular neucrosis   Pulmonary fibrosis Maternal Grandmother    Diabetes Maternal Grandfather    Stroke Maternal Grandfather    Pneumonia Paternal Grandfather    Cancer Sister        melonoma   Other Brother        back problems, nerve problems, soft bones    Social History   Socioeconomic History   Marital status: Single    Spouse name: Not on file   Number of children: Not on file   Years of education: Not on file   Highest education level: Not on file  Occupational History   Not on file  Tobacco Use   Smoking status: Every Day    Current packs/day: 0.25    Types: Cigarettes   Smokeless tobacco: Never  Vaping Use   Vaping status: Never Used  Substance and Sexual Activity   Alcohol use: Yes    Alcohol/week: 1.0 standard drink of alcohol    Types: 1 Cans of beer per week    Comment: occ    Drug use: Not Currently  Types: "Crack" cocaine, Benzodiazepines   Sexual activity: Not Currently    Birth control/protection: Post-menopausal  Other Topics Concern   Not on file  Social History Narrative   Not on file   Social Drivers of Health   Financial Resource Strain: Not on file  Food Insecurity: Food Insecurity Present (03/17/2024)   Hunger Vital Sign    Worried About Running Out of Food in the Last Year: Often true    Ran Out of Food in the Last Year: Often true  Transportation Needs: No Transportation Needs (03/17/2024)   PRAPARE - Administrator, Civil Service (Medical): No    Lack of Transportation (Non-Medical): No  Physical Activity: Not on file  Stress: Not on file  Social Connections:  Not on file  Intimate Partner Violence: Not At Risk (03/17/2024)   Humiliation, Afraid, Rape, and Kick questionnaire    Fear of Current or Ex-Partner: No    Emotionally Abused: No    Physically Abused: No    Sexually Abused: No    Outpatient Medications Prior to Visit  Medication Sig Dispense Refill   acetaminophen  (TYLENOL ) 325 MG tablet Take 2 tablets (650 mg total) by mouth every 6 (six) hours as needed for mild pain (or Fever >/= 101).     citalopram  (CELEXA ) 20 MG tablet Take 1 tablet (20 mg total) by mouth daily. 90 tablet 3   feeding supplement (ENSURE ENLIVE / ENSURE PLUS) LIQD Take 237 mLs by mouth 3 (three) times daily between meals. 23700 mL 3   hydrOXYzine  (ATARAX ) 50 MG tablet Take 1 tablet (50 mg total) by mouth 3 (three) times daily as needed for anxiety. 90 tablet 1   ipratropium-albuterol  (DUONEB) 0.5-2.5 (3) MG/3ML SOLN Take 3 mLs by nebulization every 6 (six) hours as needed. 360 mL 11   methocarbamol  (ROBAXIN ) 500 MG tablet Take 500 mg by mouth every 8 (eight) hours as needed for muscle spasms.     predniSONE  (DELTASONE ) 10 MG tablet Take 6 tablets (60 mg total) by mouth daily with breakfast. And decrease by one tablet daily 21 tablet 0   albuterol  (VENTOLIN  HFA) 108 (90 Base) MCG/ACT inhaler Inhale 2 puffs into the lungs every 6 (six) hours as needed for wheezing or shortness of breath. (Patient not taking: Reported on 03/27/2024) 18 g 1   No facility-administered medications prior to visit.    No Known Allergies  ROS Review of Systems  Constitutional:  Negative for chills and fever.  Eyes:  Negative for visual disturbance.  Respiratory:  Positive for cough and shortness of breath. Negative for chest tightness and wheezing.   Neurological:  Negative for dizziness and headaches.      Objective:     Physical Exam HENT:     Head: Normocephalic.     Mouth/Throat:     Mouth: Mucous membranes are moist.  Cardiovascular:     Rate and Rhythm: Normal rate.      Heart sounds: Normal heart sounds.  Pulmonary:     Effort: Pulmonary effort is normal.     Breath sounds: Wheezing present.  Neurological:     Mental Status: She is alert.     BP (!) 153/80   Pulse 75   Resp 19   Ht 5\' 5"  (1.651 m)   Wt 118 lb 1.9 oz (53.6 kg)   LMP  (LMP Unknown)   SpO2 (!) 85% Comment: on 2 liters oxygen  BMI 19.66 kg/m  Wt Readings from Last 3 Encounters:  03/27/24 118 lb 1.9 oz (53.6 kg)  03/17/24 130 lb (59 kg)  02/10/24 127 lb (57.6 kg)    Lab Results  Component Value Date   TSH 0.095 (L) 08/26/2023   Lab Results  Component Value Date   WBC 8.5 03/22/2024   HGB 10.9 (L) 03/22/2024   HCT 33.1 (L) 03/22/2024   MCV 89.7 03/22/2024   PLT 446 (H) 03/22/2024   Lab Results  Component Value Date   NA 138 03/22/2024   K 3.9 03/22/2024   CO2 33 (H) 03/22/2024   GLUCOSE 189 (H) 03/22/2024   BUN 16 03/22/2024   CREATININE 0.50 03/22/2024   BILITOT 0.5 03/17/2024   ALKPHOS 90 03/17/2024   AST 8 (L) 03/17/2024   ALT 9 03/17/2024   PROT 6.7 03/17/2024   ALBUMIN 3.2 (L) 03/17/2024   CALCIUM  8.3 (L) 03/22/2024   ANIONGAP 5 03/22/2024   EGFR 102 08/26/2023   Lab Results  Component Value Date   CHOL 189 08/26/2023   Lab Results  Component Value Date   HDL 69 08/26/2023   Lab Results  Component Value Date   LDLCALC 105 (H) 08/26/2023   Lab Results  Component Value Date   TRIG 85 08/26/2023   Lab Results  Component Value Date   CHOLHDL 2.7 08/26/2023   Lab Results  Component Value Date   HGBA1C 6.0 (H) 08/26/2023      Assessment & Plan:  Community acquired pneumonia, unspecified laterality Assessment & Plan: Pending results of CBC and BMP. The patient is encouraged to complete the remaining course of her steroid taper and to follow up with pulmonology as scheduled. She is currently on 2 liters of supplemental oxygen while on room air in no acute distress. A repeat chest X-ray will be obtained in 6 weeks to assess for pneumonia  clearance.    Orders: -     CBC with Differential/Platelet -     BMP8+EGFR -     DG Chest 2 View  Panic attacks Assessment & Plan: Pending results of the CBC and BMP. The patient is encouraged to complete the remaining course of her steroid taper and follow up with pulmonology as scheduled. She is currently requiring 2 liters of supplemental oxygen on room air. A repeat chest X-ray is planned in 6 weeks to assess for resolution of pneumonia.    Orders: -     ALPRAZolam ; Take 1 tablet (0.25 mg total) by mouth as needed for anxiety.  Dispense: 15 tablet; Refill: 0  Note: This chart has been completed using Engineer, civil (consulting) software, and while attempts have been made to ensure accuracy, certain words and phrases may not be transcribed as intended.    Follow-up: No follow-ups on file.   Amritha Yorke, FNP

## 2024-03-27 NOTE — Transitions of Care (Post Inpatient/ED Visit) (Signed)
 03/27/2024  Patient ID: Casey [name], female   DOB: 02-04-1961, 63 y.o.   MRN: [id number]  Spoke with patient on 03/24/2024.  Patient with extreme shortness of breath who reported she was goning to call 911.   After chart review, it appears patient did not go to the hospital.  Reviewed messages from patient to pulmonary and PCP office visit today.    Placed 1 additional call to patient with no answer.   PLAN: will attempt again tomorrow.  Orpha Blade, RN, BSN, CEN [employer]- Transition of Care Team.  [facility] [phone]

## 2024-03-27 NOTE — Patient Instructions (Addendum)
 I appreciate the opportunity to provide care to you today!   Labs: please stop by the lab today to get your blood drawn (CBC, BMP)  Panic Attacks: Start Xanax  (alprazolam ) 0.25 mg to be taken as needed for acute panic symptoms. A maximum of 15 tablets will be provided per month.  Pneumonia Clearance A follow-up chest X-ray is recommended to assess for resolution of pneumonia and confirm clearance.Please stop by [facility] on April 26 2024. Clinical improvement will be evaluated based on symptom resolution, including reduced cough, improved breathing, normalized temperature, and improved energy levels.    Please continue to a heart-healthy diet and increase your physical activities. Try to exercise for at least five days a week.    It was a pleasure to see you and I look forward to continuing to work together on your health and well-being. Please do not hesitate to call the office if you need care or have questions about your care.  In case of emergency, please visit the Emergency Department for urgent care, or contact our clinic at [phone] to schedule an appointment. We're here to help you!   Have a wonderful day and week. With Gratitude, [place] Churchill MSN, FNP-BC

## 2024-03-28 ENCOUNTER — Other Ambulatory Visit: Payer: Self-pay

## 2024-03-28 ENCOUNTER — Inpatient Hospital Stay (HOSPITAL_COMMUNITY)
Admission: EM | Admit: 2024-03-28 | Discharge: 2024-03-30 | DRG: 190 | Disposition: A | Discharge status: Home / Self Care | Payer: MEDICAID | Attending: Family Medicine | Admitting: Family Medicine

## 2024-03-28 ENCOUNTER — Ambulatory Visit: Payer: MEDICAID | Admitting: Internal Medicine

## 2024-03-28 ENCOUNTER — Emergency Department (HOSPITAL_COMMUNITY): Payer: MEDICAID

## 2024-03-28 ENCOUNTER — Encounter (HOSPITAL_COMMUNITY): Payer: Self-pay | Admitting: Emergency Medicine

## 2024-03-28 DIAGNOSIS — Z765 Malingerer [conscious simulation]: Secondary | ICD-10-CM

## 2024-03-28 DIAGNOSIS — Z820 Family history of epilepsy and other diseases of the nervous system: Secondary | ICD-10-CM | POA: Diagnosis not present

## 2024-03-28 DIAGNOSIS — Z8249 Family history of ischemic heart disease and other diseases of the circulatory system: Secondary | ICD-10-CM

## 2024-03-28 DIAGNOSIS — Z79899 Other long term (current) drug therapy: Secondary | ICD-10-CM

## 2024-03-28 DIAGNOSIS — J441 Chronic obstructive pulmonary disease with (acute) exacerbation: Secondary | ICD-10-CM | POA: Diagnosis present

## 2024-03-28 DIAGNOSIS — M549 Dorsalgia, unspecified: Secondary | ICD-10-CM | POA: Diagnosis present

## 2024-03-28 DIAGNOSIS — F411 Generalized anxiety disorder: Secondary | ICD-10-CM | POA: Diagnosis present

## 2024-03-28 DIAGNOSIS — G8929 Other chronic pain: Secondary | ICD-10-CM | POA: Diagnosis present

## 2024-03-28 DIAGNOSIS — Z823 Family history of stroke: Secondary | ICD-10-CM

## 2024-03-28 DIAGNOSIS — Z818 Family history of other mental and behavioral disorders: Secondary | ICD-10-CM

## 2024-03-28 DIAGNOSIS — F410 Panic disorder [episodic paroxysmal anxiety] without agoraphobia: Secondary | ICD-10-CM

## 2024-03-28 DIAGNOSIS — F339 Major depressive disorder, recurrent, unspecified: Secondary | ICD-10-CM | POA: Diagnosis present

## 2024-03-28 DIAGNOSIS — R0602 Shortness of breath: Secondary | ICD-10-CM | POA: Diagnosis present

## 2024-03-28 DIAGNOSIS — D72829 Elevated white blood cell count, unspecified: Secondary | ICD-10-CM | POA: Diagnosis present

## 2024-03-28 DIAGNOSIS — Z96612 Presence of left artificial shoulder joint: Secondary | ICD-10-CM | POA: Diagnosis present

## 2024-03-28 DIAGNOSIS — J9621 Acute and chronic respiratory failure with hypoxia: Secondary | ICD-10-CM | POA: Diagnosis present

## 2024-03-28 DIAGNOSIS — F1320 Sedative, hypnotic or anxiolytic dependence, uncomplicated: Secondary | ICD-10-CM | POA: Diagnosis present

## 2024-03-28 DIAGNOSIS — F17210 Nicotine dependence, cigarettes, uncomplicated: Secondary | ICD-10-CM | POA: Diagnosis present

## 2024-03-28 DIAGNOSIS — Z1152 Encounter for screening for COVID-19: Secondary | ICD-10-CM

## 2024-03-28 DIAGNOSIS — E876 Hypokalemia: Secondary | ICD-10-CM | POA: Diagnosis present

## 2024-03-28 DIAGNOSIS — Z833 Family history of diabetes mellitus: Secondary | ICD-10-CM

## 2024-03-28 DIAGNOSIS — Z7951 Long term (current) use of inhaled steroids: Secondary | ICD-10-CM | POA: Diagnosis not present

## 2024-03-28 DIAGNOSIS — Z8349 Family history of other endocrine, nutritional and metabolic diseases: Secondary | ICD-10-CM

## 2024-03-28 LAB — CBC
HCT: 36.5 % (ref 36.0–46.0)
Hemoglobin: 11.7 g/dL — ABNORMAL LOW (ref 12.0–15.0)
MCH: 29.5 pg (ref 26.0–34.0)
MCHC: 32.1 g/dL (ref 30.0–36.0)
MCV: 91.9 fL (ref 80.0–100.0)
Platelets: 470 10*3/uL — ABNORMAL HIGH (ref 150–400)
RBC: 3.97 MIL/uL (ref 3.87–5.11)
RDW: 12.8 % (ref 11.5–15.5)
WBC: 20.8 10*3/uL — ABNORMAL HIGH (ref 4.0–10.5)
nRBC: 0 % (ref 0.0–0.2)

## 2024-03-28 LAB — CBC WITH DIFFERENTIAL/PLATELET
Basophils Absolute: 0 10*3/uL (ref 0.0–0.2)
Basos: 0 %
EOS (ABSOLUTE): 0.2 10*3/uL (ref 0.0–0.4)
Eos: 1 %
Hematocrit: 38 % (ref 34.0–46.6)
Hemoglobin: 12.2 g/dL (ref 11.1–15.9)
Immature Grans (Abs): 0 10*3/uL (ref 0.0–0.1)
Immature Granulocytes: 0 %
Lymphocytes Absolute: 3.3 10*3/uL — ABNORMAL HIGH (ref 0.7–3.1)
Lymphs: 12 %
MCH: 29.5 pg (ref 26.6–33.0)
MCHC: 32.1 g/dL (ref 31.5–35.7)
MCV: 92 fL (ref 79–97)
Monocytes Absolute: 2.2 10*3/uL — ABNORMAL HIGH (ref 0.1–0.9)
Monocytes: 8 %
Neutrophils Absolute: 22 10*3/uL — ABNORMAL HIGH (ref 1.4–7.0)
Neutrophils: 79 %
Platelets: 547 10*3/uL — ABNORMAL HIGH (ref 150–450)
RBC: 4.14 x10E6/uL (ref 3.77–5.28)
RDW: 12.3 % (ref 11.7–15.4)
WBC: 27.8 10*3/uL (ref 3.4–10.8)

## 2024-03-28 LAB — BASIC METABOLIC PANEL WITH GFR
Anion gap: 7 (ref 5–15)
BUN: 10 mg/dL (ref 8–23)
CO2: 28 mmol/L (ref 22–32)
Calcium: 7.9 mg/dL — ABNORMAL LOW (ref 8.9–10.3)
Chloride: 100 mmol/L (ref 98–111)
Creatinine, Ser: 0.39 mg/dL — ABNORMAL LOW (ref 0.44–1.00)
GFR, Estimated: >60 mL/min (ref >60)
Glucose, Bld: 99 mg/dL (ref 70–99)
Potassium: 3.4 mmol/L — ABNORMAL LOW (ref 3.5–5.1)
Sodium: 135 mmol/L (ref 135–145)

## 2024-03-28 LAB — RESP PANEL BY RT-PCR (RSV, FLU A&B, COVID)  RVPGX2
Influenza A by PCR: NEGATIVE
Influenza B by PCR: NEGATIVE
Resp Syncytial Virus by PCR: NEGATIVE
SARS Coronavirus 2 by RT PCR: NEGATIVE

## 2024-03-28 MED ORDER — OXYCODONE-ACETAMINOPHEN 5-325 MG PO TABS
1.0000 | ORAL_TABLET | ORAL | Status: DC | PRN
  Administered 2024-03-28 – 2024-03-30 (×8): 1 via ORAL
  Filled 2024-03-28 (×8): qty 1

## 2024-03-28 MED ORDER — ACETAMINOPHEN 650 MG RE SUPP: 650.0000 mg | Freq: Four times a day (QID) | RECTAL | Status: DC | PRN

## 2024-03-28 MED ORDER — OXYCODONE-ACETAMINOPHEN 5-325 MG PO TABS
1.0000 | ORAL_TABLET | Freq: Once | ORAL | Status: AC
  Administered 2024-03-28: 1 via ORAL
  Filled 2024-03-28: qty 1

## 2024-03-28 MED ORDER — ALPRAZOLAM 0.5 MG PO TABS
0.2500 mg | ORAL_TABLET | Freq: Once | ORAL | Status: AC
Start: 2024-03-28 — End: 2024-03-28
  Administered 2024-03-28: 0.25 mg via ORAL
  Filled 2024-03-28: qty 1

## 2024-03-28 MED ORDER — DM-GUAIFENESIN ER 30-600 MG PO TB12
1.0000 | ORAL_TABLET | Freq: Two times a day (BID) | ORAL | Status: DC
  Administered 2024-03-28 – 2024-03-30 (×5): 1 via ORAL
  Filled 2024-03-28 (×5): qty 1

## 2024-03-28 MED ORDER — PREDNISONE 20 MG PO TABS
40.0000 mg | ORAL_TABLET | Freq: Every day | ORAL | Status: DC
  Administered 2024-03-29 – 2024-03-30 (×2): 40 mg via ORAL
  Filled 2024-03-28 (×2): qty 2

## 2024-03-28 MED ORDER — LEVALBUTEROL HCL 0.63 MG/3ML IN NEBU
0.6300 mg | INHALATION_SOLUTION | Freq: Four times a day (QID) | RESPIRATORY_TRACT | Status: DC
  Administered 2024-03-28 – 2024-03-30 (×7): 0.63 mg via RESPIRATORY_TRACT
  Filled 2024-03-28 (×7): qty 3

## 2024-03-28 MED ORDER — CITALOPRAM HYDROBROMIDE 20 MG PO TABS
20.0000 mg | ORAL_TABLET | Freq: Every day | ORAL | Status: DC
  Administered 2024-03-28 – 2024-03-30 (×3): 20 mg via ORAL
  Filled 2024-03-28 (×4): qty 1

## 2024-03-28 MED ORDER — METHYLPREDNISOLONE SODIUM SUCC 125 MG IJ SOLR
125.0000 mg | Freq: Once | INTRAMUSCULAR | Status: DC
  Filled 2024-03-28: qty 2

## 2024-03-28 MED ORDER — BUDESONIDE 0.25 MG/2ML IN SUSP
0.2500 mg | Freq: Two times a day (BID) | RESPIRATORY_TRACT | Status: DC
  Administered 2024-03-28 – 2024-03-30 (×4): 0.25 mg via RESPIRATORY_TRACT
  Filled 2024-03-28 (×4): qty 2

## 2024-03-28 MED ORDER — ENOXAPARIN SODIUM 40 MG/0.4ML IJ SOSY
40.0000 mg | PREFILLED_SYRINGE | INTRAMUSCULAR | Status: DC
  Administered 2024-03-28 – 2024-03-29 (×2): 40 mg via SUBCUTANEOUS
  Filled 2024-03-28 (×2): qty 0.4

## 2024-03-28 MED ORDER — IPRATROPIUM-ALBUTEROL 0.5-2.5 (3) MG/3ML IN SOLN
3.0000 mL | Freq: Once | RESPIRATORY_TRACT | Status: AC
  Administered 2024-03-28: 3 mL via RESPIRATORY_TRACT
  Filled 2024-03-28: qty 3

## 2024-03-28 MED ORDER — ALPRAZOLAM 0.25 MG PO TABS
0.2500 mg | ORAL_TABLET | Freq: Three times a day (TID) | ORAL | Status: DC | PRN
  Administered 2024-03-28 – 2024-03-29 (×3): 0.25 mg via ORAL
  Filled 2024-03-28 (×3): qty 1

## 2024-03-28 MED ORDER — METHOCARBAMOL 500 MG PO TABS: 500.0000 mg | ORAL_TABLET | Freq: Three times a day (TID) | ORAL | Status: DC | PRN

## 2024-03-28 MED ORDER — POTASSIUM CHLORIDE CRYS ER 20 MEQ PO TBCR
40.0000 meq | EXTENDED_RELEASE_TABLET | Freq: Once | ORAL | Status: AC
  Administered 2024-03-28: 40 meq via ORAL
  Filled 2024-03-28: qty 2

## 2024-03-28 MED ORDER — ALPRAZOLAM 0.5 MG PO TABS: 1.0000 mg | ORAL_TABLET | Freq: Three times a day (TID) | ORAL | Status: DC | PRN

## 2024-03-28 MED ORDER — ONDANSETRON HCL 4 MG PO TABS: 4.0000 mg | ORAL_TABLET | Freq: Four times a day (QID) | ORAL | Status: DC | PRN

## 2024-03-28 MED ORDER — ONDANSETRON HCL 4 MG/2ML IJ SOLN: 4.0000 mg | Freq: Four times a day (QID) | INTRAMUSCULAR | Status: DC | PRN

## 2024-03-28 MED ORDER — PREDNISONE 50 MG PO TABS
60.0000 mg | ORAL_TABLET | Freq: Once | ORAL | Status: AC
  Administered 2024-03-28: 60 mg via ORAL
  Filled 2024-03-28: qty 1

## 2024-03-28 MED ORDER — ENSURE ENLIVE PO LIQD
237.0000 mL | Freq: Three times a day (TID) | ORAL | Status: DC
Start: 2024-03-28 — End: 2024-03-30
  Administered 2024-03-28 – 2024-03-30 (×5): 237 mL via ORAL

## 2024-03-28 MED ORDER — ACETAMINOPHEN 325 MG PO TABS: 650.0000 mg | ORAL_TABLET | Freq: Four times a day (QID) | ORAL | Status: DC | PRN

## 2024-03-28 MED ORDER — MAGNESIUM SULFATE 2 GM/50ML IV SOLN
2.0000 g | Freq: Once | INTRAVENOUS | Status: AC
  Administered 2024-03-28: 2 g via INTRAVENOUS
  Filled 2024-03-28: qty 50

## 2024-03-28 MED ORDER — ALBUTEROL SULFATE (2.5 MG/3ML) 0.083% IN NEBU
2.5000 mg | INHALATION_SOLUTION | Freq: Once | RESPIRATORY_TRACT | Status: AC
  Administered 2024-03-28: 2.5 mg via RESPIRATORY_TRACT
  Filled 2024-03-28: qty 3

## 2024-03-28 NOTE — ED Triage Notes (Addendum)
 Pt BIB [facility] from home c/o SOB, pt reports O2 sats 80's prior to arrival, 2L at home  Pt states she needs Xanax  to help her with breathing, states she is out, took last dose last night, when asked why she is out of them, pt states "because I take too many", Pt then states she has 7 Xanax  at home but lied so she can get more.  V/S 92-93% on 4L, 110/67, 103HR, CBG 124 Pt given 2.5 albuterol  PTA

## 2024-03-28 NOTE — ED Notes (Signed)
 In to help pt to bsc and sats 88% on 2L [place]. Turned back up to 4L, encouraged to slow deep breathe, pt up to 91% and edp aware.

## 2024-03-28 NOTE — ED Notes (Signed)
 Pt 02 turned down to her home dose of 2L [place]

## 2024-03-28 NOTE — TOC Initial Note (Signed)
 Transition of Care [facility]) - Initial/Assessment Note    Patient Details  Name: Casey [name] MRN: [id number] Date of Birth: 1961/04/03  Transition of Care [facility]) CM/SW Contact:    Geraldina Klinefelter, RN Phone Number: 03/28/2024, 8:17 PM  Clinical Narrative:                 Pt admitted c/COPD exacerbation. Admitted earlier this month c/pneumonia. Current smoker, last smoked yesterday. Pt has home oxygen and nebulizer c/[employer] and a walker. Pt uses Medicaid brokered transportation. Per pt she has missed her follow-up appts due to no-show transportation.   Prior hospitalization 11/19/2023 at [facility] for injuries sustained in [facility], hospitalized for seven days. Dc'd to [facility] in [place] where she stayed until 02/03/2024. Since her dc pt has made multiple request to PCP and [facility] for prescription pain meds and Xanax . [facility] denied request stating she was far enough out from injuries to take OTC meds. Pt has a hx of polysubstance abuse, including benzos and opiates that she was not prescribed at the time.   Pt states she was supposed to have an aide come to her home when she left SNF but no one ever showed and she did not follow up as to why. Pt currently stating she needs help c/housekeeping and meal prep. Instructed pt to follow-up c/her DSS case manager or have a family member act on her behalf.  DSS contact info, smoking cessation added to AVS. TOC to follow.   Expected Discharge Plan: Home w Home Health Services Barriers to Discharge: Continued Medical Work up  Patient Goals and CMS Choice Patient states their goals for this hospitalization and ongoing recovery are:: To breathe better CMS Medicare.gov Compare Post Acute Care list provided to:: Patient Choice offered to / list presented to : Patient [place] ownership interest in [facility].provided to:: Patient   Expected Discharge Plan and Services In-house Referral:  Clinical Social Work Discharge Planning Services: CM Consult Post Acute Care Choice: Home Health, Skilled Nursing Facility Living arrangements for the past 2 months: Single Family Home                 Prior Living Arrangements/Services Living arrangements for the past 2 months: Single Family Home Lives with:: Self Patient language and need for interpreter reviewed:: Yes Do you feel safe going back to the place where you live?: Yes      Need for Family Participation in Patient Care: Yes (Comment) Care giver support system in place?: Yes (comment) Current home services: DME (walker, nebulizer, home oxygen) Criminal Activity/Legal Involvement Pertinent to Current Situation/Hospitalization: No - Comment as needed  Activities of Daily Living   ADL Screening (condition at time of admission) Independently performs ADLs?: No Does the patient have a NEW difficulty with bathing/dressing/toileting/self-feeding that is expected to last >3 days?: Yes (Initiates electronic notice to provider for possible OT consult) Does the patient have a NEW difficulty with getting in/out of bed, walking, or climbing stairs that is expected to last >3 days?: Yes (Initiates electronic notice to provider for possible PT consult) Does the patient have a NEW difficulty with communication that is expected to last >3 days?: No Is the patient deaf or have difficulty hearing?: No Does the patient have difficulty seeing, even when wearing glasses/contacts?: No Does the patient have difficulty concentrating, remembering, or making decisions?: No  Permission Sought/Granted Permission sought to share information with : Case Manager, Family Supports, Magazine features editor  Permission granted to share information with : Yes, Verbal Permission Granted    Emotional Assessment Appearance:: Appears older than stated age Attitude/Demeanor/Rapport: Engaged Affect (typically observed): Calm Orientation: : Oriented to Self,  Oriented to Place, Oriented to  Time, Oriented to Situation Alcohol / Substance Use: Tobacco Use Psych Involvement: No (comment)  Admission diagnosis:  COPD exacerbation (HCC) [J44.1] COPD with acute exacerbation (HCC) [J44.1] Patient Active Problem List   Diagnosis Date Noted   Community acquired pneumonia 03/27/2024   Panic attacks 03/27/2024   Cigarette smoker 02/10/2024   Chronic respiratory failure with hypoxia (HCC) 02/10/2024   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   Pneumothorax on left 11/26/2023   Asthmatic bronchitis , chronic (HCC) 09/28/2023   Encounter for immunization 08/26/2023   Tobacco abuse 08/15/2023   Malnutrition of moderate degree 07/27/2023   CAP (community acquired pneumonia) 07/26/2023   Acute and chronic respiratory failure with hypoxia (HCC) 07/26/2023   Cocaine abuse 07/26/2023   Tetrahydrocannabinol (THC) use disorder 07/26/2023   Hypokalemia 07/26/2023   Hyperglycemia 07/26/2023   Drug-seeking behavior 07/26/2023   Leukocytosis 07/26/2023   Opioid use disorder 07/17/2023   Pneumonia of right upper lobe due to infectious organism 07/08/2023   Delirium 07/07/2023   Vomiting without nausea 07/07/2023   COPD with acute exacerbation (HCC) 07/06/2023   Normocytic anemia 07/06/2023   Pneumonia of right lower lobe due to infectious organism 07/06/2023   Nausea and vomiting 07/05/2023   Severe protein-calorie malnutrition (HCC) 06/29/2023   Polysubstance abuse (HCC) 06/26/2023   Acute metabolic encephalopathy 06/26/2023   Aspiration pneumonia (HCC) 06/25/2023   Primary insomnia 08/20/2021   Depression, recurrent (HCC) 08/20/2021   GAD (generalized anxiety disorder) 08/20/2021   Elevated blood pressure reading in office without diagnosis of hypertension 08/20/2021   Closed fracture of left proximal humerus 01/01/2020   Fracture of base of fifth metacarpal bone of right hand 10/06/2016   Benzodiazepine dependence, continuous (HCC) 01/09/2016    History of panic attacks 09/11/2014   Malaise 09/03/2014   Fatigue 09/03/2014   Body mass index (BMI) of 20.0-20.9 in adult 09/03/2014   PCP:  Zarwolo, Gloria, FNP Pharmacy:   [facility] [phone] - [place], [place] - [address] [place] [address] Phone: [phone] Fax: [phone]  Social Drivers of Health (SDOH) Social History: SDOH Screenings   Food Insecurity: No Food Insecurity (03/28/2024)  Recent Concern: Food Insecurity - Food Insecurity Present (03/17/2024)  Housing: Low Risk  (03/28/2024)  Transportation Needs: Unmet Transportation Needs (03/28/2024)  Utilities: Not At Risk (03/28/2024)  Depression (PHQ2-9): Low Risk  (03/27/2024)  Tobacco Use: High Risk (03/28/2024)   SDOH Interventions:   Readmission Risk Interventions    03/28/2024    8:07 PM 08/16/2023    2:16 PM 07/27/2023   10:34 AM  Readmission Risk Prevention Plan  Transportation Screening Complete Complete Complete  HRI or Home Care Consult   Complete  Social Work Consult for Recovery Care Planning/Counseling   Complete  Palliative Care Screening   Not Applicable  Medication Review Oceanographer) Complete Complete Complete  PCP or Specialist appointment within 3-5 days of discharge Complete    HRI or Home Care Consult Complete Complete   SW Recovery Care/Counseling Consult Complete Complete   Palliative Care Screening Not Applicable Not Applicable   Skilled Nursing Facility Complete Not Applicable

## 2024-03-28 NOTE — Plan of Care (Signed)

## 2024-03-28 NOTE — H&P (Signed)
 History and Physical    SHELLE GALDAMEZ EAV:[id number] DOB: 1961-06-19 DOA: 03/28/2024  PCP: Zarwolo, Gloria, FNP   Patient coming from: Home  Chief Complaint: Dyspnea/hypoxemia  HPI: Casey [name] is a 64 y.o. female with medical history significant for polysubstance abuse, chronic hypoxemia on 2 L nasal cannula, COPD, chronic tobacco abuse, history of chronic opioid and benzodiazepine use, and recent discharge on 5/14 for acute COPD exacerbation who presented to the ED with worsening dyspnea as well as significant hypoxemia in the 88th percentile on her baseline home oxygen.  She also had stated that she needed Xanax  to help with her breathing and stated that she is close to running out and uses more than she is supposed to.  She states that she continues to use cigarettes, but only uses 2 cigarettes/day and is trying to cut back.  She does claim to have some chills as well as coughing episodes with yellowish sputum production.   ED Course: Vital signs with tachypnea noted respirations in the 23-25 range.  Temperature 97.5 F.  Leukocytosis of 20,800 noted and hemoglobin 11.7 with potassium 3.4.  Chest x-ray with no acute findings noted.  Patient started breathing treatments as well as steroids, also given some Xanax , oral pain medication, and IV magnesium .  Flu, COVID, RSV studies negative.  Review of Systems: Reviewed as noted above, otherwise negative.  Past Medical History:  Diagnosis Date   Anxiety    Chronic back pain    COPD (chronic obstructive pulmonary disease) (HCC)    Depression    History of panic attacks 09/11/2014   Medical history non-contributory    Pneumonia    Polysubstance abuse (HCC)    History of taking non-prescribed opiates, BZDs; also +cocaine and THC   Ruptured disc, thoracic    Suicide attempt (HCC) 2002    Past Surgical History:  Procedure Laterality Date   CESAREAN SECTION     CLAVICLE SURGERY     REVERSE SHOULDER ARTHROPLASTY Left 01/01/2020    Procedure: LEFT REVERSE SHOULDER ARTHROPLASTY;  Surgeon: Jasmine Mesi, MD;  Location: [facility] OR;  Service: Orthopedics;  Laterality: Left;   TUBAL LIGATION       reports that she has been smoking cigarettes. She has never used smokeless tobacco. She reports current alcohol use of about 1.0 standard drink of alcohol per week. She reports that she does not currently use drugs after having used the following drugs: "Crack" cocaine and Benzodiazepines.  No Known Allergies  Family History  Problem Relation Age of Onset   Diabetes Mother    Pulmonary fibrosis Mother    Heart attack Father    Parkinson's disease Sister    Bipolar disorder Sister    Thyroid  disease Sister    Heart attack Brother    Other Son        vascular neucrosis   Pulmonary fibrosis Maternal Grandmother    Diabetes Maternal Grandfather    Stroke Maternal Grandfather    Pneumonia Paternal Grandfather    Cancer Sister        melonoma   Other Brother        back problems, nerve problems, soft bones    Prior to Admission medications   Medication Sig Start Date End Date Taking? Authorizing Provider  acetaminophen  (TYLENOL ) 325 MG tablet Take 2 tablets (650 mg total) by mouth every 6 (six) hours as needed for mild pain (or Fever >/= 101). 08/17/23   Colin Dawley, MD  albuterol  (VENTOLIN  HFA) 108 (90 Base)  MCG/ACT inhaler Inhale 2 puffs into the lungs every 6 (six) hours as needed for wheezing or shortness of breath. Patient not taking: Reported on 03/27/2024 08/17/23   Colin Dawley, MD  ALPRAZolam  (XANAX ) 0.25 MG tablet Take 1 tablet (0.25 mg total) by mouth as needed for anxiety. 03/27/24   Zarwolo, Gloria, FNP  citalopram  (CELEXA ) 20 MG tablet Take 1 tablet (20 mg total) by mouth daily. 03/22/24   Demaris Fillers, MD  feeding supplement (ENSURE ENLIVE / ENSURE PLUS) LIQD Take 237 mLs by mouth 3 (three) times daily between meals. 08/17/23   Colin Dawley, MD  hydrOXYzine  (ATARAX ) 50 MG tablet Take 1 tablet (50 mg  total) by mouth 3 (three) times daily as needed for anxiety. 03/22/24   Demaris Fillers, MD  ipratropium-albuterol  (DUONEB) 0.5-2.5 (3) MG/3ML SOLN Take 3 mLs by nebulization every 6 (six) hours as needed. 02/10/24   Diamond Formica, MD  methocarbamol  (ROBAXIN ) 500 MG tablet Take 500 mg by mouth every 8 (eight) hours as needed for muscle spasms.    [provider]  predniSONE  (DELTASONE ) 10 MG tablet Take 6 tablets (60 mg total) by mouth daily with breakfast. And decrease by one tablet daily 03/23/24   Demaris Fillers, MD    Physical Exam: Vitals:   03/28/24 1140 03/28/24 1145 03/28/24 1230 03/28/24 1334  BP:   102/62   Pulse:  91    Resp:  (!) 23    Temp: (!) 97.5 F (36.4 C)   98.2 F (36.8 C)  TempSrc: Oral   Oral  SpO2:  92%    Weight:      Height:        Constitutional: NAD, calm, comfortable Vitals:   03/28/24 1140 03/28/24 1145 03/28/24 1230 03/28/24 1334  BP:   102/62   Pulse:  91    Resp:  (!) 23    Temp: (!) 97.5 F (36.4 C)   98.2 F (36.8 C)  TempSrc: Oral   Oral  SpO2:  92%    Weight:      Height:       Eyes: lids and conjunctivae normal Neck: normal, supple Respiratory: Wheezing noted bilaterally with congestion. Normal respiratory effort. No accessory muscle use.  4 L nasal cannula. Cardiovascular: Regular rate and rhythm, no murmurs.  Tachycardic Abdomen: no tenderness, no distention. Bowel sounds positive.  Musculoskeletal:  No edema. Skin: no rashes, lesions, ulcers.  Psychiatric: Flat affect  Labs on Admission: I have personally reviewed following labs and imaging studies  CBC: Recent Labs  Lab 03/22/24 0432 03/27/24 1409 03/28/24 0906  WBC 8.5 27.8* 20.8*  NEUTROABS  --  22.0*  --   HGB 10.9* 12.2 11.7*  HCT 33.1* 38.0 36.5  MCV 89.7 92 91.9  PLT 446* 547* 470*   Basic Metabolic Panel: Recent Labs  Lab 03/22/24 0432 03/28/24 0906  NA 138 135  K 3.9 3.4*  CL 100 100  CO2 33* 28  GLUCOSE 189* 99  BUN 16 10  CREATININE 0.50 0.39*   CALCIUM  8.3* 7.9*  MG 2.2  --    GFR: Estimated Creatinine Clearance: 60 mL/min (A) (by C-G formula based on SCr of 0.39 mg/dL (L)). Liver Function Tests: No results for input(s): "AST", "ALT", "ALKPHOS", "BILITOT", "PROT", "ALBUMIN" in the last 168 hours. No results for input(s): "LIPASE", "AMYLASE" in the last 168 hours. No results for input(s): "AMMONIA" in the last 168 hours. Coagulation Profile: No results for input(s): "INR", "PROTIME" in the last 168 hours. Cardiac  Enzymes: No results for input(s): "CKTOTAL", "CKMB", "CKMBINDEX", "TROPONINI" in the last 168 hours. BNP (last 3 results) No results for input(s): "PROBNP" in the last 8760 hours. HbA1C: No results for input(s): "HGBA1C" in the last 72 hours. CBG: No results for input(s): "GLUCAP" in the last 168 hours. Lipid Profile: No results for input(s): "CHOL", "HDL", "LDLCALC", "TRIG", "CHOLHDL", "LDLDIRECT" in the last 72 hours. Thyroid  Function Tests: No results for input(s): "TSH", "T4TOTAL", "FREET4", "T3FREE", "THYROIDAB" in the last 72 hours. Anemia Panel: No results for input(s): "VITAMINB12", "FOLATE", "FERRITIN", "TIBC", "IRON", "RETICCTPCT" in the last 72 hours. Urine analysis:    Component Value Date/Time   COLORURINE YELLOW 03/17/2024 0955   APPEARANCEUR CLEAR 03/17/2024 0955   LABSPEC 1.008 03/17/2024 0955   PHURINE 6.0 03/17/2024 0955   GLUCOSEU NEGATIVE 03/17/2024 0955   HGBUR NEGATIVE 03/17/2024 0955   BILIRUBINUR NEGATIVE 03/17/2024 0955   KETONESUR NEGATIVE 03/17/2024 0955   PROTEINUR NEGATIVE 03/17/2024 0955   UROBILINOGEN 0.2 05/02/2009 1015   NITRITE NEGATIVE 03/17/2024 0955   LEUKOCYTESUR NEGATIVE 03/17/2024 0955    Radiological Exams on Admission: DG Chest Port 1 View Result Date: 03/28/2024 CLINICAL DATA:  Shortness of breath. EXAM: PORTABLE CHEST 1 VIEW COMPARISON:  03/19/2024 FINDINGS: Chronic coarse lung markings. No new airspace disease or lung consolidation. No overt pulmonary edema.  Old left rib fractures. Left shoulder arthroplasty. Heart size is normal. Negative for a pneumothorax. IMPRESSION: Chronic lung changes without acute findings. Electronically Signed   By: Elene Griffes M.D.   On: 03/28/2024 10:52    EKG: Independently reviewed.  A-fib 76 bpm.  Assessment/Plan Principal Problem:   COPD with acute exacerbation (HCC) Active Problems:   Benzodiazepine dependence, continuous (HCC)   Depression, recurrent (HCC)   Acute and chronic respiratory failure with hypoxia (HCC)   Hypokalemia   Drug-seeking behavior   Leukocytosis   Cigarette smoker    Acute on chronic hypoxemic respiratory failure secondary to recurrent COPD exacerbation -Recently discharged 5/14 for similar - Avoid use of IV Solu-Medrol  as she gets panic attacks from this, plan to use prednisone  which was given in ED - Chronically wears 2 L at baseline, wean as tolerated - Antitussives and mucolytic's - Pulmicort  twice daily - Xopenex every 6 hours given some tachycardia  Mild hypokalemia - Replete and reevaluate  Leukocytosis - Likely demargination in response to steroids - Chest x-ray with no findings of pneumonia - Continue on IV azithromycin  and check procalcitonin  Depression/GAD - Continue home medications - Xanax  1 mg 3 times daily as needed helps to control severe panic attacks  Tobacco abuse/polysubstance abuse - Counseled on cessation - Prior UDS positive for opioids and benzodiazepines   DVT prophylaxis: Lovenox  Code Status: Full Family Communication: None at bedside Disposition Plan: Admit for treatment of COPD exacerbation Consults called: None Admission status: Inpatient, telemetry  Severity of Illness: The appropriate patient status for this patient is INPATIENT. Inpatient status is judged to be reasonable and necessary in order to provide the required intensity of service to ensure the patient's safety. The patient's presenting symptoms, physical exam findings, and  initial radiographic and laboratory data in the context of their chronic comorbidities is felt to place them at high risk for further clinical deterioration. Furthermore, it is not anticipated that the patient will be medically stable for discharge from the hospital within 2 midnights of admission.   * I certify that at the point of admission it is my clinical judgment that the patient will require inpatient hospital care  spanning beyond 2 midnights from the point of admission due to high intensity of service, high risk for further deterioration and high frequency of surveillance required.*   Majesti Gambrell D Damyia Strider DO [facility]  If 7PM-7AM, please contact night-coverage [email or web address]  03/28/2024, 2:30 PM

## 2024-03-28 NOTE — ED Provider Notes (Signed)
 [place] EMERGENCY DEPARTMENT AT [facility] Provider Note   CSN: [id number] Arrival date & time: 03/28/24  4098     History  Chief Complaint  Patient presents with   Shortness of Breath    Casey [name] is a 63 y.o. female.  Patient complains of shortness of breath.  She has significant COPD and is usually on 2 L nasal cannula.  She states that her sats have been in the 70s today.  The history is provided by the patient and medical records. No language interpreter was used.  Shortness of Breath Severity:  Moderate Onset quality:  Sudden Timing:  Constant Progression:  Waxing and waning Chronicity:  Recurrent Context: activity   Relieved by:  Nothing Worsened by:  Nothing Ineffective treatments:  None tried Associated symptoms: no abdominal pain, no chest pain, no cough, no headaches and no rash        Home Medications Prior to Admission medications   Medication Sig Start Date End Date Taking? Authorizing Provider  acetaminophen  (TYLENOL ) 325 MG tablet Take 2 tablets (650 mg total) by mouth every 6 (six) hours as needed for mild pain (or Fever >/= 101). 08/17/23   Colin Dawley, MD  albuterol  (VENTOLIN  HFA) 108 (90 Base) MCG/ACT inhaler Inhale 2 puffs into the lungs every 6 (six) hours as needed for wheezing or shortness of breath. Patient not taking: Reported on 03/27/2024 08/17/23   Colin Dawley, MD  ALPRAZolam  (XANAX ) 0.25 MG tablet Take 1 tablet (0.25 mg total) by mouth as needed for anxiety. 03/27/24   Zarwolo, Gloria, FNP  citalopram  (CELEXA ) 20 MG tablet Take 1 tablet (20 mg total) by mouth daily. 03/22/24   Demaris Fillers, MD  feeding supplement (ENSURE ENLIVE / ENSURE PLUS) LIQD Take 237 mLs by mouth 3 (three) times daily between meals. 08/17/23   Colin Dawley, MD  hydrOXYzine  (ATARAX ) 50 MG tablet Take 1 tablet (50 mg total) by mouth 3 (three) times daily as needed for anxiety. 03/22/24   Demaris Fillers, MD  ipratropium-albuterol  (DUONEB) 0.5-2.5 (3)  MG/3ML SOLN Take 3 mLs by nebulization every 6 (six) hours as needed. 02/10/24   Diamond Formica, MD  methocarbamol  (ROBAXIN ) 500 MG tablet Take 500 mg by mouth every 8 (eight) hours as needed for muscle spasms.    [provider]  predniSONE  (DELTASONE ) 10 MG tablet Take 6 tablets (60 mg total) by mouth daily with breakfast. And decrease by one tablet daily 03/23/24   Tat, Myrtie Atkinson, MD      Allergies    Patient has no known allergies.    Review of Systems   Review of Systems  Constitutional:  Negative for appetite change and fatigue.  HENT:  Negative for congestion, ear discharge and sinus pressure.   Eyes:  Negative for discharge.  Respiratory:  Positive for shortness of breath. Negative for cough.   Cardiovascular:  Negative for chest pain.  Gastrointestinal:  Negative for abdominal pain and diarrhea.  Genitourinary:  Negative for frequency and hematuria.  Musculoskeletal:  Negative for back pain.  Skin:  Negative for rash.  Neurological:  Negative for seizures and headaches.  Psychiatric/Behavioral:  Negative for hallucinations.     Physical Exam Updated Vital Signs BP 102/62   Pulse 91   Temp (!) 97.5 F (36.4 C) (Oral)   Resp (!) 23   Ht 5\' 5"  (1.651 m)   Wt 53.5 kg   LMP  (LMP Unknown)   SpO2 92%   BMI 19.64 kg/m  Physical  Exam Vitals and nursing note reviewed.  Constitutional:      Appearance: She is well-developed.  HENT:     Head: Normocephalic.     Nose: Nose normal.  Eyes:     General: No scleral icterus.    Conjunctiva/sclera: Conjunctivae normal.  Neck:     Thyroid : No thyromegaly.  Cardiovascular:     Rate and Rhythm: Normal rate and regular rhythm.     Heart sounds: No murmur heard.    No friction rub. No gallop.  Pulmonary:     Breath sounds: No stridor. Wheezing present. No rales.  Chest:     Chest wall: No tenderness.  Abdominal:     General: There is no distension.     Tenderness: There is no abdominal tenderness. There is no rebound.   Musculoskeletal:        General: Normal range of motion.     Cervical back: Neck supple.  Lymphadenopathy:     Cervical: No cervical adenopathy.  Skin:    Findings: No erythema or rash.  Neurological:     Mental Status: She is alert and oriented to person, place, and time.     Motor: No abnormal muscle tone.     Coordination: Coordination normal.  Psychiatric:        Behavior: Behavior normal.     ED Results / Procedures / Treatments   Labs (all labs ordered are listed, but only abnormal results are displayed) Labs Reviewed  BASIC METABOLIC PANEL WITH GFR - Abnormal; Notable for the following components:      Result Value   Potassium 3.4 (*)    Creatinine, Ser 0.39 (*)    Calcium  7.9 (*)    All other components within normal limits  CBC - Abnormal; Notable for the following components:   WBC 20.8 (*)    Hemoglobin 11.7 (*)    Platelets 470 (*)    All other components within normal limits  RESP PANEL BY RT-PCR (RSV, FLU A&B, COVID)  RVPGX2    EKG None  Radiology DG Chest Port 1 View Result Date: 03/28/2024 CLINICAL DATA:  Shortness of breath. EXAM: PORTABLE CHEST 1 VIEW COMPARISON:  03/19/2024 FINDINGS: Chronic coarse lung markings. No new airspace disease or lung consolidation. No overt pulmonary edema. Old left rib fractures. Left shoulder arthroplasty. Heart size is normal. Negative for a pneumothorax. IMPRESSION: Chronic lung changes without acute findings. Electronically Signed   By: Elene Griffes M.D.   On: 03/28/2024 10:52    Procedures Procedures    Medications Ordered in ED Medications  methylPREDNISolone  sodium succinate (SOLU-MEDROL ) 125 mg/2 mL injection 125 mg (125 mg Intravenous Not Given 03/28/24 0912)  magnesium  sulfate IVPB 2 g 50 mL (0 g Intravenous Stopped 03/28/24 1044)  ipratropium-albuterol  (DUONEB) 0.5-2.5 (3) MG/3ML nebulizer solution 3 mL (3 mLs Nebulization Given 03/28/24 0917)  albuterol  (PROVENTIL ) (2.5 MG/3ML) 0.083% nebulizer solution 2.5 mg  (2.5 mg Nebulization Given 03/28/24 0917)  oxyCODONE -acetaminophen  (PERCOCET/ROXICET) 5-325 MG per tablet 1 tablet (1 tablet Oral Given 03/28/24 0911)  predniSONE  (DELTASONE ) tablet 60 mg (60 mg Oral Given 03/28/24 0930)  ALPRAZolam  (XANAX ) tablet 0.25 mg (0.25 mg Oral Given 03/28/24 1211)    ED Course/ Medical Decision Making/ A&P                                 Medical Decision Making Amount and/or Complexity of Data Reviewed Labs: ordered. Radiology: ordered.  Risk Prescription  drug management. Decision regarding hospitalization.  Moderate exacerbation of COPD with hypoxia.  Patient will be admitted to medicine        Final Clinical Impression(s) / ED Diagnoses Final diagnoses:  COPD exacerbation [facility])    Rx / DC Orders ED Discharge Orders     None         Cheyenne Cotta, MD 03/31/24 1140

## 2024-03-28 NOTE — Evaluation (Signed)
 Physical Therapy Evaluation Patient Details Name: Casey [name] MRN: [id number] DOB: 1961-07-31 Today's Date: 03/28/2024  History of Present Illness  Casey [name] is a 63 y.o. female with medical history significant for polysubstance abuse, chronic hypoxemia on 2 L nasal cannula, COPD, chronic tobacco abuse, history of chronic opioid and benzodiazepine use, and recent discharge on 5/14 for acute COPD exacerbation who presented to the ED with worsening dyspnea as well as significant hypoxemia in the 88th percentile on her baseline home oxygen.  She also had stated that she needed Xanax  to help with her breathing and stated that she is close to running out and uses more than she is supposed to.  She states that she continues to use cigarettes, but only uses 2 cigarettes/day and is trying to cut back.  She does claim to have some chills as well as coughing episodes with yellowish sputum production.  Clinical Impression  PT is mod I in mobility.  PT is on 4L of O2.  Activity limitation is more respiratory than physical.          If plan is discharge home, recommend the following: Help with stairs or ramp for entrance   Can travel by private vehicle    yes    Equipment Recommendations None recommended by PT  Recommendations for Other Services       Functional Status Assessment Patient has had a recent decline in their functional status and demonstrates the ability to make significant improvements in function in a reasonable and predictable amount of time.     Precautions / Restrictions Precautions Precautions: Fall Precaution/Restrictions Comments: PT states that she fell out of the couch and now both legs are very sore Restrictions Weight Bearing Restrictions Per Provider Order: No      Mobility  Bed Mobility Overal bed mobility: Modified Independent                  Transfers Overall transfer level: Modified independent   Transfers: Sit to/from Stand Sit to Stand:  Modified independent (Device/Increase time)   Step pivot transfers: Modified independent (Device/Increase time)       General transfer comment: with 4 L O2    Ambulation/Gait Ambulation/Gait assistance: Modified independent (Device/Increase time) Gait Distance (Feet): 40 Feet Assistive device: None Gait Pattern/deviations: WFL(Within Functional Limits)   Gait velocity interpretation: 1.31 - 2.62 ft/sec, indicative of limited community ambulator   General Gait Details: Amb with O2 stated that she felt SOB.  O2 sitting bedside on 4L O2 98; after ambulation 96  Stairs            Wheelchair Mobility     Tilt Bed    Modified Rankin (Stroke Patients Only)          Pertinent Vitals/Pain Pain Assessment Pain Assessment: 0-10 Pain Score: 5  Pain Location: legs Pain Descriptors / Indicators: Sore Pain Intervention(s): Limited activity within patient's tolerance    Home Living Family/patient expects to be discharged to:: Private residence Living Arrangements: Alone Available Help at Discharge: Family;Available PRN/intermittently;Friend(s) Type of Home: House Home Access: Stairs to enter   Entrance Stairs-Number of Steps: 1   Home Layout: One level Home Equipment: Agricultural consultant (2 wheels) Additional Comments: Pt reports she now lives alone.    Prior Function Prior Level of Function : Independent/Modified Independent             Mobility Comments: Reports as household ambulator without AD, and doesn't do much ambulating outside of home. Has RW but  does not use ADLs Comments: Independent     Extremity/Trunk Assessment        Lower Extremity Assessment Lower Extremity Assessment: Generalized weakness    Cervical / Trunk Assessment Cervical / Trunk Assessment: Normal  Communication   Communication Communication: No apparent difficulties    Cognition Arousal: Alert Behavior During Therapy: WFL for tasks assessed/performed   PT - Cognitive  impairments: No apparent impairments                         Following commands: Intact       Cueing Cueing Techniques: Verbal cues            Assessment/Plan    PT Assessment Patient needs continued PT services  PT Problem List Decreased activity tolerance;Decreased balance;Pain       PT Treatment Interventions Gait training;Stair training;Functional mobility training;Therapeutic activities;Therapeutic exercise;Balance training;Patient/family education    PT Goals (Current goals can be found in the Care Plan section)  Acute Rehab PT Goals Patient Stated Goal: Pt to be breathing better with activitiy PT Goal Formulation: With patient Time For Goal Achievement: 03/31/24 Potential to Achieve Goals: Good    Frequency Min 2X/week        AM-PAC PT "6 Clicks" Mobility  Outcome Measure Help needed turning from your back to your side while in a flat bed without using bedrails?: None Help needed moving from lying on your back to sitting on the side of a flat bed without using bedrails?: None Help needed moving to and from a bed to a chair (including a wheelchair)?: A Little Help needed standing up from a chair using your arms (e.g., wheelchair or bedside chair)?: None Help needed to walk in hospital room?: None Help needed climbing 3-5 steps with a railing? : A Little 6 Click Score: 22    End of Session Equipment Utilized During Treatment: Gait belt Activity Tolerance: Patient tolerated treatment well Patient left: in bed Nurse Communication: Mobility status PT Visit Diagnosis: Muscle weakness (generalized) (M62.81);History of falling (Z91.81)    Time: 1600-1630 PT Time Calculation (min) (ACUTE ONLY): 30 min   Charges:   PT Evaluation $PT Eval Low Complexity: 1 Low   PT General Charges $$ ACUTE PT VISIT: 1 Visit        Leodis Rainwater, PT CLT [phone]  03/28/2024, 4:30 PM

## 2024-03-28 NOTE — Discharge Instructions (Addendum)
 [facility] [address],  [place], [place] [address] [phone]  [facility] Erma Hay, Case Manager [phone]   IMPORTANT INFORMATION: PAY CLOSE ATTENTION   PHYSICIAN DISCHARGE INSTRUCTIONS  Follow with Primary care provider  Zarwolo, Gloria, FNP  and other consultants as instructed by your Hospitalist Physician  SEEK MEDICAL CARE OR RETURN TO EMERGENCY ROOM IF SYMPTOMS COME BACK, WORSEN OR NEW PROBLEM DEVELOPS   Please note: You were cared for by a hospitalist during your hospital stay. Every effort will be made to forward records to your primary care provider.  You can request that your primary care provider send for your hospital records if they have not received them.  Once you are discharged, your primary care physician will handle any further medical issues. Please note that NO REFILLS for any discharge medications will be authorized once you are discharged, as it is imperative that you return to your primary care physician (or establish a relationship with a primary care physician if you do not have one) for your post hospital discharge needs so that they can reassess your need for medications and monitor your lab values.  Please get a complete blood count and chemistry panel checked by your Primary MD at your next visit, and again as instructed by your Primary MD.  Get Medicines reviewed and adjusted: Please take all your medications with you for your next visit with your Primary MD  Laboratory/radiological data: Please request your Primary MD to go over all hospital tests and procedure/radiological results at the follow up, please ask your primary care provider to get all Hospital records sent to his/her office.  In some cases, they will be blood work, cultures and biopsy results pending at the time of your discharge. Please request that your primary care provider follow up on these results.  If you are diabetic, please bring your blood sugar readings with  you to your follow up appointment with primary care.    Please call and make your follow up appointments as soon as possible.    Also Note the following: If you experience worsening of your admission symptoms, develop shortness of breath, life threatening emergency, suicidal or homicidal thoughts you must seek medical attention immediately by calling 911 or calling your MD immediately  if symptoms less severe.  You must read complete instructions/literature along with all the possible adverse reactions/side effects for all the Medicines you take and that have been prescribed to you. Take any new Medicines after you have completely understood and accpet all the possible adverse reactions/side effects.   Do not drive when taking Pain medications or sleeping medications (Benzodiazepines)  Do not take more than prescribed Pain, Sleep and Anxiety Medications. It is not advisable to combine anxiety,sleep and pain medications without talking with your primary care practitioner  Special Instructions: If you have smoked or chewed Tobacco  in the last 2 yrs please stop smoking, stop any regular Alcohol  and or any Recreational drug use.  Wear Seat belts while driving.  Do not drive if taking any narcotic, mind altering or controlled substances or recreational drugs or alcohol.

## 2024-03-28 NOTE — ED Notes (Signed)
 See triage notes. Pt asking for food/drink. Encouraged many times to slow breathing down with breathing in nose and out mouth. Pt attempting. Color wnl. Slightly shaky. Non diaphoretic.

## 2024-03-29 DIAGNOSIS — J441 Chronic obstructive pulmonary disease with (acute) exacerbation: Secondary | ICD-10-CM | POA: Diagnosis not present

## 2024-03-29 DIAGNOSIS — F339 Major depressive disorder, recurrent, unspecified: Secondary | ICD-10-CM

## 2024-03-29 DIAGNOSIS — J9621 Acute and chronic respiratory failure with hypoxia: Secondary | ICD-10-CM

## 2024-03-29 DIAGNOSIS — F17210 Nicotine dependence, cigarettes, uncomplicated: Secondary | ICD-10-CM

## 2024-03-29 DIAGNOSIS — F1320 Sedative, hypnotic or anxiolytic dependence, uncomplicated: Secondary | ICD-10-CM

## 2024-03-29 DIAGNOSIS — E876 Hypokalemia: Secondary | ICD-10-CM

## 2024-03-29 DIAGNOSIS — D72829 Elevated white blood cell count, unspecified: Secondary | ICD-10-CM

## 2024-03-29 LAB — CBC
HCT: 33.8 % — ABNORMAL LOW (ref 36.0–46.0)
Hemoglobin: 10.9 g/dL — ABNORMAL LOW (ref 12.0–15.0)
MCH: 29.6 pg (ref 26.0–34.0)
MCHC: 32.2 g/dL (ref 30.0–36.0)
MCV: 91.8 fL (ref 80.0–100.0)
Platelets: 459 10*3/uL — ABNORMAL HIGH (ref 150–400)
RBC: 3.68 MIL/uL — ABNORMAL LOW (ref 3.87–5.11)
RDW: 12.9 % (ref 11.5–15.5)
WBC: 17.5 10*3/uL — ABNORMAL HIGH (ref 4.0–10.5)
nRBC: 0 % (ref 0.0–0.2)

## 2024-03-29 LAB — BASIC METABOLIC PANEL WITH GFR
Anion gap: 5 (ref 5–15)
BUN: 12 mg/dL (ref 8–23)
CO2: 32 mmol/L (ref 22–32)
Calcium: 8.3 mg/dL — ABNORMAL LOW (ref 8.9–10.3)
Chloride: 97 mmol/L — ABNORMAL LOW (ref 98–111)
Creatinine, Ser: 0.31 mg/dL — ABNORMAL LOW (ref 0.44–1.00)
GFR, Estimated: >60 mL/min (ref >60)
Glucose, Bld: 104 mg/dL — ABNORMAL HIGH (ref 70–99)
Potassium: 3.7 mmol/L (ref 3.5–5.1)
Sodium: 134 mmol/L — ABNORMAL LOW (ref 135–145)

## 2024-03-29 LAB — MAGNESIUM: Magnesium: 2.1 mg/dL (ref 1.7–2.4)

## 2024-03-29 MED ORDER — GUAIFENESIN-DM 100-10 MG/5ML PO SYRP: 5.0000 mL | ORAL_SOLUTION | ORAL | Status: DC | PRN

## 2024-03-29 MED ORDER — GUAIFENESIN-CODEINE 100-10 MG/5ML PO SOLN
5.0000 mL | Freq: Four times a day (QID) | ORAL | Status: DC | PRN
  Administered 2024-03-29 – 2024-03-30 (×2): 5 mL via ORAL
  Filled 2024-03-29 (×2): qty 5

## 2024-03-29 MED ORDER — ALPRAZOLAM 1 MG PO TABS
1.0000 mg | ORAL_TABLET | Freq: Three times a day (TID) | ORAL | Status: DC | PRN
  Administered 2024-03-29 – 2024-03-30 (×4): 1 mg via ORAL
  Filled 2024-03-29 (×5): qty 1

## 2024-03-29 NOTE — Hospital Course (Signed)
 63 y.o. female with medical history significant for polysubstance abuse, chronic hypoxemia on 2 L nasal cannula, COPD, chronic tobacco abuse, history of chronic opioid and benzodiazepine use, and recent discharge on 5/14 for acute COPD exacerbation who presented to the ED with worsening dyspnea as well as significant hypoxemia in the 88th percentile on her baseline home oxygen.  She also had stated that she needed Xanax  to help with her breathing and stated that she is close to running out and uses more than she is supposed to.  She states that she continues to use cigarettes, but only uses 2 cigarettes/day and is trying to cut back.  She does claim to have some chills as well as coughing episodes with yellowish sputum production.   ED Course: Vital signs with tachypnea noted respirations in the 23-25 range.  Temperature 97.5 F.  Leukocytosis of 20,800 noted and hemoglobin 11.7 with potassium 3.4.  Chest x-ray with no acute findings noted.  Patient started breathing treatments as well as steroids, also given some Xanax , oral pain medication, and IV magnesium .  Flu, COVID, RSV studies negative.

## 2024-03-29 NOTE — Progress Notes (Signed)
 PROGRESS NOTE   Casey [name]  ZOX:[id number] DOB: [birth year] DOA: 03/28/2024 PCP: Zarwolo, Gloria, FNP   Chief Complaint  Patient presents with   Shortness of Breath   Level of care: Telemetry  Brief Admission History:  63 y.o. female with medical history significant for polysubstance abuse, chronic hypoxemia on 2 L nasal cannula, COPD, chronic tobacco abuse, history of chronic opioid and benzodiazepine use, and recent discharge on 5/14 for acute COPD exacerbation who presented to the ED with worsening dyspnea as well as significant hypoxemia in the 88th percentile on her baseline home oxygen.  She also had stated that she needed Xanax  to help with her breathing and stated that she is close to running out and uses more than she is supposed to.  She states that she continues to use cigarettes, but only uses 2 cigarettes/day and is trying to cut back.  She does claim to have some chills as well as coughing episodes with yellowish sputum production.   ED Course: Vital signs with tachypnea noted respirations in the 23-25 range.  Temperature 97.5 F.  Leukocytosis of 20,800 noted and hemoglobin 11.7 with potassium 3.4.  Chest x-ray with no acute findings noted.  Patient started breathing treatments as well as steroids, also given some Xanax , oral pain medication, and IV magnesium .  Flu, COVID, RSV studies negative.   Assessment and Plan:  Acute on chronic hypoxemic respiratory failure secondary to recurrent COPD exacerbation -Recently discharged 5/14 for similar - Avoiding IV Solu-Medrol  as she gets panic attacks from this, plan to use prednisone  which was given in ED - Chronically wears 2 L at baseline, wean as tolerated - Antitussives and mucolytic's - Pulmicort  twice daily - Xopenex every 6 hours given some tachycardia   Mild hypokalemia - Repleted   Leukocytosis - Likely demargination in response to steroids - Chest x-ray with no findings of pneumonia - Continue on IV azithromycin   and check procalcitonin   Depression/GAD - Continue home medications - Xanax  1 mg 3 times daily as needed helps to control severe panic attacks   Tobacco abuse/polysubstance abuse - Counseled on cessation - Prior UDS positive for opioids and benzodiazepines  DVT prophylaxis: enoxaparin  Code Status: Full  Family Communication:  Disposition: home in 1-2 days   Consultants:   Procedures:   Antimicrobials:    Subjective: Pt having severe anxiety on the lower doses of alprazolam , a/w worsening SOB symptoms, does much better on the 1 mg doses of alprazolam .  Having cough and chest congestion and SOB symptoms.   Objective: Vitals:   03/29/24 0409 03/29/24 0752 03/29/24 0823 03/29/24 1314  BP: 107/68  (!) 152/75 (!) 142/79  Pulse: 77  84 99  Resp:    16  Temp: 98.4 F (36.9 C)   98 F (36.7 C)  TempSrc: Oral     SpO2: 98% 97% 95% 97%  Weight:      Height:        Intake/Output Summary (Last 24 hours) at 03/29/2024 1631 Last data filed at 03/29/2024 1300 Gross per 24 hour  Intake 480 ml  Output --  Net 480 ml   Filed Weights   03/28/24 0843 03/28/24 1519  Weight: 53.5 kg 55.4 kg   Examination:  General exam: Appears calm and comfortable  Respiratory system: diffuse rales, moderate increased work of breathing.  Cardiovascular system: normal S1 & S2 heard. No JVD, murmurs, rubs, gallops or clicks. No pedal edema. Gastrointestinal system: Abdomen is nondistended, soft and nontender. No organomegaly or masses  felt. Normal bowel sounds heard. Central nervous system: Alert and oriented. No focal neurological deficits. Extremities: Symmetric 5 x 5 power. Skin: No rashes, lesions or ulcers. Psychiatry: Judgement and insight appear normal. Mood & affect appropriate.   Data Reviewed: I have personally reviewed following labs and imaging studies  CBC: Recent Labs  Lab 03/27/24 1409 03/28/24 0906 03/29/24 0438  WBC 27.8* 20.8* 17.5*  NEUTROABS 22.0*  --   --   HGB 12.2  11.7* 10.9*  HCT 38.0 36.5 33.8*  MCV 92 91.9 91.8  PLT 547* 470* 459*    Basic Metabolic Panel: Recent Labs  Lab 03/28/24 0906 03/29/24 0438  NA 135 134*  K 3.4* 3.7  CL 100 97*  CO2 28 32  GLUCOSE 99 104*  BUN 10 12  CREATININE 0.39* 0.31*  CALCIUM  7.9* 8.3*  MG  --  2.1    CBG: No results for input(s): "GLUCAP" in the last 168 hours.  Recent Results (from the past 240 hours)  Resp panel by RT-PCR (RSV, Flu A&B, Covid) Anterior Nasal Swab     Status: None   Collection Time: 03/28/24  1:20 PM   Specimen: Anterior Nasal Swab  Result Value Ref Range Status   SARS Coronavirus 2 by RT PCR NEGATIVE NEGATIVE Final    Comment: (NOTE) SARS-CoV-2 target nucleic acids are NOT DETECTED.  The SARS-CoV-2 RNA is generally detectable in upper respiratory specimens during the acute phase of infection. The lowest concentration of SARS-CoV-2 viral copies this assay can detect is 138 copies/mL. A negative result does not preclude SARS-Cov-2 infection and should not be used as the sole basis for treatment or other patient management decisions. A negative result may occur with  improper specimen collection/handling, submission of specimen other than nasopharyngeal swab, presence of viral mutation(s) within the areas targeted by this assay, and inadequate number of viral copies(<138 copies/mL). A negative result must be combined with clinical observations, patient history, and epidemiological information. The expected result is Negative.  Fact Sheet for Patients:  BloggerCourse.com  Fact Sheet for Healthcare Providers:  SeriousBroker.it  This test is no t yet approved or cleared by the [place]  FDA and  has been authorized for detection and/or diagnosis of SARS-CoV-2 by FDA under an Emergency Use Authorization (EUA). This EUA will remain  in effect (meaning this test can be used) for the duration of the COVID-19 declaration  under Section 564(b)(1) of the Act, 21 U.S.C.section 360bbb-3(b)(1), unless the authorization is terminated  or revoked sooner.       Influenza A by PCR NEGATIVE NEGATIVE Final   Influenza B by PCR NEGATIVE NEGATIVE Final    Comment: (NOTE) The Xpert Xpress SARS-CoV-2/FLU/RSV plus assay is intended as an aid in the diagnosis of influenza from Nasopharyngeal swab specimens and should not be used as a sole basis for treatment. Nasal washings and aspirates are unacceptable for Xpert Xpress SARS-CoV-2/FLU/RSV testing.  Fact Sheet for Patients: BloggerCourse.com  Fact Sheet for Healthcare Providers: SeriousBroker.it  This test is not yet approved or cleared by the [place]  FDA and has been authorized for detection and/or diagnosis of SARS-CoV-2 by FDA under an Emergency Use Authorization (EUA). This EUA will remain in effect (meaning this test can be used) for the duration of the COVID-19 declaration under Section 564(b)(1) of the Act, 21 U.S.C. section 360bbb-3(b)(1), unless the authorization is terminated or revoked.     Resp Syncytial Virus by PCR NEGATIVE NEGATIVE Final    Comment: (NOTE) Fact Sheet for Patients:  BloggerCourse.com  Fact Sheet for Healthcare Providers: SeriousBroker.it  This test is not yet approved or cleared by the [place]  FDA and has been authorized for detection and/or diagnosis of SARS-CoV-2 by FDA under an Emergency Use Authorization (EUA). This EUA will remain in effect (meaning this test can be used) for the duration of the COVID-19 declaration under Section 564(b)(1) of the Act, 21 U.S.C. section 360bbb-3(b)(1), unless the authorization is terminated or revoked.  Performed at [facility], [address], [place], [place] [address]      Radiology Studies: [facility] Chest [facility] 1 View Result Date: 03/28/2024 CLINICAL DATA:  Shortness of breath.  EXAM: PORTABLE CHEST 1 VIEW COMPARISON:  03/19/2024 FINDINGS: Chronic coarse lung markings. No new airspace disease or lung consolidation. No overt pulmonary edema. Old left rib fractures. Left shoulder arthroplasty. Heart size is normal. Negative for a pneumothorax. IMPRESSION: Chronic lung changes without acute findings. Electronically Signed   By: Elene Griffes M.D.   On: 03/28/2024 10:52    Scheduled Meds:  budesonide  (PULMICORT ) nebulizer solution  0.25 mg Nebulization BID   citalopram   20 mg Oral Daily   dextromethorphan -guaiFENesin   1 tablet Oral BID   enoxaparin  (LOVENOX ) injection  40 mg Subcutaneous Q24H   feeding supplement  237 mL Oral TID BM   levalbuterol  0.63 mg Nebulization Q6H   predniSONE   40 mg Oral Q breakfast   Continuous Infusions:   LOS: 1 day   Time spent: 58 mins  Nehemiah Mcfarren Lincoln Renshaw, MD How to contact the [facility] Attending or Consulting provider 7A - 7P or covering provider during after hours 7P -7A, for this patient?  Check the care team in [facility] and look for a) attending/consulting TRH provider listed and b) the TRH team listed Log into [email or web address] to find provider on call.  Locate the TRH provider you are looking for under [facility] and page to a number that you can be directly reached. If you still have difficulty reaching the provider, please page the [facility] (Director on Call) for the Hospitalists listed on amion for assistance.  03/29/2024, 4:31 PM

## 2024-03-29 NOTE — Plan of Care (Signed)
  Problem: Education: Goal: Knowledge of General Education information will improve Description: Including pain rating scale, medication(s)/side effects and non-pharmacologic comfort measures Outcome: Adequate for Discharge   Problem: Health Behavior/Discharge Planning: Goal: Ability to manage health-related needs will improve Outcome: Adequate for Discharge   Problem: Clinical Measurements: Goal: Ability to maintain clinical measurements within normal limits will improve Outcome: Adequate for Discharge Goal: Diagnostic test results will improve Outcome: Adequate for Discharge

## 2024-03-29 NOTE — Plan of Care (Signed)
  Problem: Education: Goal: Knowledge of General Education information will improve Description: Including pain rating scale, medication(s)/side effects and non-pharmacologic comfort measures Outcome: Progressing   Problem: Health Behavior/Discharge Planning: Goal: Ability to manage health-related needs will improve Outcome: Progressing   Problem: Clinical Measurements: Goal: Ability to maintain clinical measurements within normal limits will improve Outcome: Not Met (add Reason) Goal: Diagnostic test results will improve Outcome: Progressing Goal: Respiratory complications will improve Outcome: Not Met (add Reason)   Problem: Activity: Goal: Risk for activity intolerance will decrease Outcome: Progressing   Problem: Nutrition: Goal: Adequate nutrition will be maintained Outcome: Progressing

## 2024-03-29 NOTE — Progress Notes (Signed)
 Mobility Specialist Progress Note:    03/29/24 1157  Mobility  Activity Ambulated with assistance in room  Level of Assistance Modified independent, requires aide device or extra time  Assistive Device None  Distance Ambulated (ft) 25 ft  Range of Motion/Exercises Active;All extremities  Activity Response Tolerated well  Mobility Referral Yes  Mobility visit 1 Mobility  Mobility Specialist Start Time (ACUTE ONLY) 1130  Mobility Specialist Stop Time (ACUTE ONLY) 1152  Mobility Specialist Time Calculation (min) (ACUTE ONLY) 22 min   Pt received in bed, agreeable to mobility. ModI to stand and ambulate with no AD. Tolerated well, audible SOB throughout. SpO2 92% on 4L after ambulating. Left pt supine, all needs met.  Glinda Lapping Mobility Specialist Please contact via Special educational needs teacher or  Rehab office at [phone]

## 2024-03-30 ENCOUNTER — Other Ambulatory Visit: Payer: Self-pay | Admitting: Family Medicine

## 2024-03-30 DIAGNOSIS — D72829 Elevated white blood cell count, unspecified: Secondary | ICD-10-CM | POA: Diagnosis not present

## 2024-03-30 DIAGNOSIS — F1320 Sedative, hypnotic or anxiolytic dependence, uncomplicated: Secondary | ICD-10-CM | POA: Diagnosis not present

## 2024-03-30 DIAGNOSIS — E876 Hypokalemia: Secondary | ICD-10-CM | POA: Diagnosis not present

## 2024-03-30 DIAGNOSIS — J441 Chronic obstructive pulmonary disease with (acute) exacerbation: Secondary | ICD-10-CM | POA: Diagnosis not present

## 2024-03-30 MED ORDER — ALBUTEROL SULFATE HFA 108 (90 BASE) MCG/ACT IN AERS
2.0000 | INHALATION_SPRAY | RESPIRATORY_TRACT | 3 refills | Status: DC | PRN
Start: 2024-03-30 — End: 2024-05-31

## 2024-03-30 MED ORDER — PREDNISONE 20 MG PO TABS: ORAL_TABLET | ORAL | 0 refills | Status: DC

## 2024-03-30 MED ORDER — DM-GUAIFENESIN ER 30-600 MG PO TB12: 1.0000 | ORAL_TABLET | Freq: Two times a day (BID) | ORAL | 0 refills | Status: DC

## 2024-03-30 MED ORDER — OXYCODONE-ACETAMINOPHEN 5-300 MG PO TABS: 1.0000 | ORAL_TABLET | Freq: Three times a day (TID) | ORAL | 0 refills | Status: DC | PRN

## 2024-03-30 MED ORDER — ALPRAZOLAM 1 MG PO TABS: 1.0000 mg | ORAL_TABLET | Freq: Three times a day (TID) | ORAL | 1 refills | Status: DC | PRN

## 2024-03-30 NOTE — TOC Transition Note (Signed)
 Transition of Care [facility]) - Discharge Note   Patient Details  Name: Casey [name] MRN: [id number] Date of Birth: 1961/09/05  Transition of Care [facility] CM/SW Contact:  Grandville Lax, LCSWA Phone Number: 03/30/2024, 10:49 AM  Clinical Narrative:    CSW updated by RN that pt is requesting [facility] services. CSW spoke with pt to inform that [facility] cannot be arranged due to pts insurance not covering [facility] services. CSW explained that she will need to reach out her Medicaid SW about getting set up with aide services. Pt is understanding. Pt has needed DME in the home. TOC signing off.  Final next level of care: Home/Self Care Barriers to Discharge: No Barriers Identified   Patient Goals and CMS Choice Patient states their goals for this hospitalization and ongoing recovery are:: To breathe better CMS Medicare.gov Compare Post Acute Care list provided to:: Patient Choice offered to / list presented to : Patient [place] ownership interest in [facility].provided to:: Patient    Discharge Placement                       Discharge Plan and Services Additional resources added to the After Visit Summary for   In-house Referral: Clinical Social Work Discharge Planning Services: CM Consult Post Acute Care Choice: Home Health, Skilled Nursing Facility                               Social Drivers of Health (SDOH) Interventions SDOH Screenings   Food Insecurity: No Food Insecurity (03/28/2024)  Recent Concern: Food Insecurity - Food Insecurity Present (03/17/2024)  Housing: Low Risk  (03/28/2024)  Transportation Needs: Unmet Transportation Needs (03/28/2024)  Utilities: Not At Risk (03/28/2024)  Depression (PHQ2-9): Low Risk  (03/27/2024)  Tobacco Use: High Risk (03/28/2024)     Readmission Risk Interventions    03/28/2024    8:07 PM 08/16/2023    2:16 PM 07/27/2023   10:34 AM  Readmission Risk Prevention Plan  Transportation Screening Complete Complete Complete  HRI or  Home Care Consult   Complete  Social Work Consult for Recovery Care Planning/Counseling   Complete  Palliative Care Screening   Not Applicable  Medication Review Oceanographer) Complete Complete Complete  PCP or Specialist appointment within 3-5 days of discharge Complete    HRI or Home Care Consult Complete Complete   SW Recovery Care/Counseling Consult Complete Complete   Palliative Care Screening Not Applicable Not Applicable   Skilled Nursing Facility Complete Not Applicable

## 2024-03-30 NOTE — Discharge Summary (Signed)
 Physician Discharge Summary  KADESHIA KASPARIAN ZOX:[id number] DOB: 12-10-1960 DOA: 03/28/2024  PCP: Zarwolo, Gloria, FNP  Admit date: 03/28/2024 Discharge date: 03/30/2024  Admitted From:  Home  Disposition:  Home   Recommendations for Outpatient Follow-up:  Follow up with PCP in 1 weeks Follow up with Behavioral health regarding panic disorder Ambulatory referral to behavioral health Please consider ambulatory referral to palliative care   Discharge Condition: STABLE   CODE STATUS: FULL DIET: regular foods    Brief Hospitalization Summary: Please see all hospital notes, images, labs for full details of the hospitalization. Admission provider HPI:  63 y.o. female with medical history significant for polysubstance abuse, chronic hypoxemia on 2 L nasal cannula, COPD, chronic tobacco abuse, history of chronic opioid and benzodiazepine use, and recent discharge on 5/14 for acute COPD exacerbation who presented to the ED with worsening dyspnea as well as significant hypoxemia in the 88th percentile on her baseline home oxygen.  She also had stated that she needed Xanax  to help with her breathing and stated that she is close to running out and uses more than she is supposed to.  She states that she continues to use cigarettes, but only uses 2 cigarettes/day and is trying to cut back.  She does claim to have some chills as well as coughing episodes with yellowish sputum production.   ED Course: Vital signs with tachypnea noted respirations in the 23-25 range.  Temperature 97.5 F.  Leukocytosis of 20,800 noted and hemoglobin 11.7 with potassium 3.4.  Chest x-ray with no acute findings noted.  Patient started breathing treatments as well as steroids, also given some Xanax , oral pain medication, and IV magnesium .  Flu, COVID, RSV studies negative.  Hospital Course by listed problems addressed  Acute on chronic hypoxemic respiratory failure secondary to recurrent COPD exacerbation -Recently discharged  5/14 for similar - Avoided IV Solu-Medrol  as she gets panic attacks from this, she is tolerating prednisone   - Chronically wears 2 L at baseline - Antitussives and mucolytics - she is feeling back to her baseline now, DC home on long prednisone  taper to try to avoid another flare-up   Mild hypokalemia - Repleted   Leukocytosis - Likely demargination in response to steroids - Chest x-ray with no findings of pneumonia - Completed IV azithromycin    Panic Attacks/GAD - Continue home medications - Xanax  1 mg 3 times daily as needed helps to control severe panic attacks - pt has been referred to behavioral health to help address the severe panic episodes and anxiety disorder.  She is dependent on benzodiazepines and goes into severe panic and anxiety episodes if she is withdrawing from benzos.  This is a difficult situation.  She agrees to follow up closely with her PCP and with behavioral health and she will need to be slowly weaned down off the benzos over a prolonged period of time.  Pt was contracted for safety and agreed to use alprazolam  only as needed and only as prescribed.  She agreed to follow up outpatient with her providers for ongoing support and management of her condition.     Tobacco abuse/polysubstance abuse - Counseled on cessation - Prior UDS positive for opioids and benzodiazepines  Discharge Diagnoses:  Principal Problem:   COPD with acute exacerbation (HCC) Active Problems:   Acute and chronic respiratory failure with hypoxia (HCC)   Benzodiazepine dependence, continuous (HCC)   Depression, recurrent (HCC)   Hypokalemia   Drug-seeking behavior   Leukocytosis   Cigarette smoker   Discharge  Instructions: Discharge Instructions     Ambulatory referral to Behavioral Health   Complete by: As directed    Hospital follow up for panic disorder      Allergies as of 03/30/2024   No Known Allergies      Medication List     STOP taking these medications     hydrOXYzine  50 MG tablet Commonly known as: ATARAX        TAKE these medications    acetaminophen  325 MG tablet Commonly known as: TYLENOL  Take 2 tablets (650 mg total) by mouth every 6 (six) hours as needed for mild pain (or Fever >/= 101).   albuterol  108 (90 Base) MCG/ACT inhaler Commonly known as: VENTOLIN  HFA Inhale 2 puffs into the lungs every 4 (four) hours as needed for wheezing or shortness of breath. What changed: when to take this   ALPRAZolam  1 MG tablet Commonly known as: XANAX  Take 1 tablet (1 mg total) by mouth 3 (three) times daily as needed for anxiety. What changed:  medication strength how much to take when to take this   citalopram  20 MG tablet Commonly known as: CELEXA  Take 1 tablet (20 mg total) by mouth daily.   dextromethorphan -guaiFENesin  30-600 MG 12hr tablet Commonly known as: MUCINEX  DM Take 1 tablet by mouth 2 (two) times daily for 3 days.   feeding supplement Liqd Take 237 mLs by mouth 3 (three) times daily between meals.   ipratropium-albuterol  0.5-2.5 (3) MG/3ML Soln Commonly known as: DUONEB Take 3 mLs by nebulization every 6 (six) hours as needed. What changed: reasons to take this   methocarbamol  500 MG tablet Commonly known as: ROBAXIN  Take 500 mg by mouth every 8 (eight) hours as needed for muscle spasms.   oxycodone -acetaminophen  5-300 MG tablet Commonly known as: LYNOX Take 1 tablet by mouth every 8 (eight) hours as needed for pain. What changed: when to take this   predniSONE  20 MG tablet Commonly known as: DELTASONE  Take 3 PO QAM x5days, 2 PO QAM x5days, 1 PO QAM x5days Start taking on: Mar 31, 2024 What changed:  medication strength how much to take how to take this when to take this additional instructions        Follow-up Information     Zarwolo, Gloria, FNP. Schedule an appointment as soon as possible for a visit in 2 week(s).   Specialty: Family Medicine Why: Hospital Follow Up Contact  information: [address] [place] [address] [phone]         [facility] [place]. Schedule an appointment as soon as possible for a visit in 1 week(s).   Specialty: Behavioral Health Why: Hospital Follow Up Contact information: [address] [place]   [phone]               No Known Allergies Allergies as of 03/30/2024   No Known Allergies      Medication List     STOP taking these medications    hydrOXYzine  50 MG tablet Commonly known as: ATARAX        TAKE these medications    acetaminophen  325 MG tablet Commonly known as: TYLENOL  Take 2 tablets (650 mg total) by mouth every 6 (six) hours as needed for mild pain (or Fever >/= 101).   albuterol  108 (90 Base) MCG/ACT inhaler Commonly known as: VENTOLIN  HFA Inhale 2 puffs into the lungs every 4 (four) hours as needed for wheezing or shortness of breath. What changed: when  to take this   ALPRAZolam  1 MG tablet Commonly known as: XANAX  Take 1 tablet (1 mg total) by mouth 3 (three) times daily as needed for anxiety. What changed:  medication strength how much to take when to take this   citalopram  20 MG tablet Commonly known as: CELEXA  Take 1 tablet (20 mg total) by mouth daily.   dextromethorphan -guaiFENesin  30-600 MG 12hr tablet Commonly known as: MUCINEX  DM Take 1 tablet by mouth 2 (two) times daily for 3 days.   feeding supplement Liqd Take 237 mLs by mouth 3 (three) times daily between meals.   ipratropium-albuterol  0.5-2.5 (3) MG/3ML Soln Commonly known as: DUONEB Take 3 mLs by nebulization every 6 (six) hours as needed. What changed: reasons to take this   methocarbamol  500 MG tablet Commonly known as: ROBAXIN  Take 500 mg by mouth every 8 (eight) hours as needed for muscle spasms.   oxycodone -acetaminophen  5-300 MG tablet Commonly known as: LYNOX Take 1 tablet by mouth every 8 (eight) hours as  needed for pain. What changed: when to take this   predniSONE  20 MG tablet Commonly known as: DELTASONE  Take 3 PO QAM x5days, 2 PO QAM x5days, 1 PO QAM x5days Start taking on: Mar 31, 2024 What changed:  medication strength how much to take how to take this when to take this additional instructions        Procedures/Studies: DG Chest Port 1 View Result Date: 03/28/2024 CLINICAL DATA:  Shortness of breath. EXAM: PORTABLE CHEST 1 VIEW COMPARISON:  03/19/2024 FINDINGS: Chronic coarse lung markings. No new airspace disease or lung consolidation. No overt pulmonary edema. Old left rib fractures. Left shoulder arthroplasty. Heart size is normal. Negative for a pneumothorax. IMPRESSION: Chronic lung changes without acute findings. Electronically Signed   By: Elene Griffes M.D.   On: 03/28/2024 10:52   DG CHEST PORT 1 VIEW Result Date: 03/19/2024 CLINICAL DATA:  Acute on chronic respiratory failure and hypoxia EXAM: PORTABLE CHEST 1 VIEW COMPARISON:  03/18/2024 FINDINGS: Heart size and mediastinal contours are stable. Diffuse chronic coarsened interstitial markings with bronchial wall thickening is again noted. Interstitial edema pattern has decreased from the previous exam. No pleural effusion or pneumothorax. Multiple chronic remote left rib fractures. Status post left shoulder arthroplasty. IMPRESSION: 1. Improvement in interstitial edema pattern. Electronically Signed   By: Kimberley Penman M.D.   On: 03/19/2024 09:41   DG CHEST PORT 1 VIEW Result Date: 03/18/2024 CLINICAL DATA:  Acute respiratory failure with hypoxia. Shortness of breath. EXAM: PORTABLE CHEST 1 VIEW COMPARISON:  Chest radiograph yesterday FINDINGS: The heart is stable in size. Unchanged mediastinal contours. Worsening ill-defined opacities throughout the left lung, with developing ill-defined right perihilar opacity. No pneumothorax or significant pleural effusion. Remote left rib fractures. IMPRESSION: Worsening ill-defined  opacities throughout the left lung, with developing ill-defined right perihilar opacity. Findings may represent asymmetric edema or infection, including atypical organisms. Electronically Signed   By: Chadwick Colonel M.D.   On: 03/18/2024 16:33   DG Chest Port 1 View Result Date: 03/17/2024 CLINICAL DATA:  Shortness of breath EXAM: PORTABLE CHEST 1 VIEW COMPARISON:  Chest radiograph 11/06/2023 FINDINGS: Patient is rotated to the left. Monitoring leads overlie the patient. Stable cardiac and mediastinal contours. Increased heterogeneous opacities left mid and lower lung. Persistent heterogeneous opacities right lung base. Biapical pleural-parenchymal thickening. No pleural effusion or pneumothorax. IMPRESSION: Increased heterogeneous opacities left mid and lower lung may represent infection in the appropriate clinical setting. Followup PA and lateral chest X-ray is recommended  in 3-4 weeks following trial of antibiotic therapy to ensure resolution and exclude underlying malignancy. Electronically Signed   By: Jone Neither M.D.   On: 03/17/2024 10:09     Subjective: Pt says she is breathing back to her baseline and she is on her baseline, she is no longer having anxiety or panic symptoms on the 1 mg dose of alprazolam   Discharge Exam: Vitals:   03/30/24 0817 03/30/24 0840  BP: 135/77   Pulse: 81   Resp:    Temp:    SpO2: 96% 97%   Vitals:   03/29/24 2041 03/30/24 0431 03/30/24 0817 03/30/24 0840  BP:  127/77 135/77   Pulse:  82 81   Resp:  19    Temp:  97.8 F (36.6 C)    TempSrc:      SpO2: 95% 97% 96% 97%  Weight:      Height:       General: Pt is alert, awake, not in acute distress Cardiovascular: normal S1/S2 +, no rubs, no gallops Respiratory: no increased work of breathing Abdominal: Soft, NT, ND, bowel sounds + Extremities: no edema, no cyanosis   The results of significant diagnostics from this hospitalization (including imaging, microbiology, ancillary and laboratory) are  listed below for reference.     Microbiology: Recent Results (from the past 240 hours)  Resp panel by RT-PCR (RSV, Flu A&B, Covid) Anterior Nasal Swab     Status: None   Collection Time: 03/28/24  1:20 PM   Specimen: Anterior Nasal Swab  Result Value Ref Range Status   SARS Coronavirus 2 by RT PCR NEGATIVE NEGATIVE Final    Comment: (NOTE) SARS-CoV-2 target nucleic acids are NOT DETECTED.  The SARS-CoV-2 RNA is generally detectable in upper respiratory specimens during the acute phase of infection. The lowest concentration of SARS-CoV-2 viral copies this assay can detect is 138 copies/mL. A negative result does not preclude SARS-Cov-2 infection and should not be used as the sole basis for treatment or other patient management decisions. A negative result may occur with  improper specimen collection/handling, submission of specimen other than nasopharyngeal swab, presence of viral mutation(s) within the areas targeted by this assay, and inadequate number of viral copies(<138 copies/mL). A negative result must be combined with clinical observations, patient history, and epidemiological information. The expected result is Negative.  Fact Sheet for Patients:  BloggerCourse.com  Fact Sheet for Healthcare Providers:  SeriousBroker.it  This test is no t yet approved or cleared by the [place]  FDA and  has been authorized for detection and/or diagnosis of SARS-CoV-2 by FDA under an Emergency Use Authorization (EUA). This EUA will remain  in effect (meaning this test can be used) for the duration of the COVID-19 declaration under Section 564(b)(1) of the Act, 21 U.S.C.section 360bbb-3(b)(1), unless the authorization is terminated  or revoked sooner.       Influenza A by PCR NEGATIVE NEGATIVE Final   Influenza B by PCR NEGATIVE NEGATIVE Final    Comment: (NOTE) The Xpert Xpress SARS-CoV-2/FLU/RSV plus assay is intended as an  aid in the diagnosis of influenza from Nasopharyngeal swab specimens and should not be used as a sole basis for treatment. Nasal washings and aspirates are unacceptable for Xpert Xpress SARS-CoV-2/FLU/RSV testing.  Fact Sheet for Patients: BloggerCourse.com  Fact Sheet for Healthcare Providers: SeriousBroker.it  This test is not yet approved or cleared by the [place]  FDA and has been authorized for detection and/or diagnosis of SARS-CoV-2 by FDA under an Emergency Use  Authorization (EUA). This EUA will remain in effect (meaning this test can be used) for the duration of the COVID-19 declaration under Section 564(b)(1) of the Act, 21 U.S.C. section 360bbb-3(b)(1), unless the authorization is terminated or revoked.     Resp Syncytial Virus by PCR NEGATIVE NEGATIVE Final    Comment: (NOTE) Fact Sheet for Patients: BloggerCourse.com  Fact Sheet for Healthcare Providers: SeriousBroker.it  This test is not yet approved or cleared by the [place]  FDA and has been authorized for detection and/or diagnosis of SARS-CoV-2 by FDA under an Emergency Use Authorization (EUA). This EUA will remain in effect (meaning this test can be used) for the duration of the COVID-19 declaration under Section 564(b)(1) of the Act, 21 U.S.C. section 360bbb-3(b)(1), unless the authorization is terminated or revoked.  Performed at [facility], [address], [place], [place] [address]      Labs: BNP (last 3 results) Recent Labs    07/28/23 1353 11/06/23 2000 03/17/24 0842  BNP 154.0* 41.0 15.0   Basic Metabolic Panel: Recent Labs  Lab 03/28/24 0906 03/29/24 0438  NA 135 134*  K 3.4* 3.7  CL 100 97*  CO2 28 32  GLUCOSE 99 104*  BUN 10 12  CREATININE 0.39* 0.31*  CALCIUM  7.9* 8.3*  MG  --  2.1   Liver Function Tests: No results for input(s): "AST", "ALT", "ALKPHOS", "BILITOT",  "PROT", "ALBUMIN" in the last 168 hours. No results for input(s): "LIPASE", "AMYLASE" in the last 168 hours. No results for input(s): "AMMONIA" in the last 168 hours. CBC: Recent Labs  Lab 03/27/24 1409 03/28/24 0906 03/29/24 0438  WBC 27.8* 20.8* 17.5*  NEUTROABS 22.0*  --   --   HGB 12.2 11.7* 10.9*  HCT 38.0 36.5 33.8*  MCV 92 91.9 91.8  PLT 547* 470* 459*   Cardiac Enzymes: No results for input(s): "CKTOTAL", "CKMB", "CKMBINDEX", "TROPONINI" in the last 168 hours. BNP: Invalid input(s): "POCBNP" CBG: No results for input(s): "GLUCAP" in the last 168 hours. D-Dimer No results for input(s): "DDIMER" in the last 72 hours. Hgb A1c No results for input(s): "HGBA1C" in the last 72 hours. Lipid Profile No results for input(s): "CHOL", "HDL", "LDLCALC", "TRIG", "CHOLHDL", "LDLDIRECT" in the last 72 hours. Thyroid  function studies No results for input(s): "TSH", "T4TOTAL", "T3FREE", "THYROIDAB" in the last 72 hours.  Invalid input(s): "FREET3" Anemia work up No results for input(s): "VITAMINB12", "FOLATE", "FERRITIN", "TIBC", "IRON", "RETICCTPCT" in the last 72 hours. Urinalysis    Component Value Date/Time   COLORURINE YELLOW 03/17/2024 0955   APPEARANCEUR CLEAR 03/17/2024 0955   LABSPEC 1.008 03/17/2024 0955   PHURINE 6.0 03/17/2024 0955   GLUCOSEU NEGATIVE 03/17/2024 0955   HGBUR NEGATIVE 03/17/2024 0955   BILIRUBINUR NEGATIVE 03/17/2024 0955   KETONESUR NEGATIVE 03/17/2024 0955   PROTEINUR NEGATIVE 03/17/2024 0955   UROBILINOGEN 0.2 05/02/2009 1015   NITRITE NEGATIVE 03/17/2024 0955   LEUKOCYTESUR NEGATIVE 03/17/2024 0955   Sepsis Labs Recent Labs  Lab 03/27/24 1409 03/28/24 0906 03/29/24 0438  WBC 27.8* 20.8* 17.5*   Microbiology Recent Results (from the past 240 hours)  Resp panel by RT-PCR (RSV, Flu A&B, Covid) Anterior Nasal Swab     Status: None   Collection Time: 03/28/24  1:20 PM   Specimen: Anterior Nasal Swab  Result Value Ref Range Status    SARS Coronavirus 2 by RT PCR NEGATIVE NEGATIVE Final    Comment: (NOTE) SARS-CoV-2 target nucleic acids are NOT DETECTED.  The SARS-CoV-2 RNA is generally detectable in upper  respiratory specimens during the acute phase of infection. The lowest concentration of SARS-CoV-2 viral copies this assay can detect is 138 copies/mL. A negative result does not preclude SARS-Cov-2 infection and should not be used as the sole basis for treatment or other patient management decisions. A negative result may occur with  improper specimen collection/handling, submission of specimen other than nasopharyngeal swab, presence of viral mutation(s) within the areas targeted by this assay, and inadequate number of viral copies(<138 copies/mL). A negative result must be combined with clinical observations, patient history, and epidemiological information. The expected result is Negative.  Fact Sheet for Patients:  BloggerCourse.com  Fact Sheet for Healthcare Providers:  SeriousBroker.it  This test is no t yet approved or cleared by the [place]  FDA and  has been authorized for detection and/or diagnosis of SARS-CoV-2 by FDA under an Emergency Use Authorization (EUA). This EUA will remain  in effect (meaning this test can be used) for the duration of the COVID-19 declaration under Section 564(b)(1) of the Act, 21 U.S.C.section 360bbb-3(b)(1), unless the authorization is terminated  or revoked sooner.       Influenza A by PCR NEGATIVE NEGATIVE Final   Influenza B by PCR NEGATIVE NEGATIVE Final    Comment: (NOTE) The Xpert Xpress SARS-CoV-2/FLU/RSV plus assay is intended as an aid in the diagnosis of influenza from Nasopharyngeal swab specimens and should not be used as a sole basis for treatment. Nasal washings and aspirates are unacceptable for Xpert Xpress SARS-CoV-2/FLU/RSV testing.  Fact Sheet for  Patients: BloggerCourse.com  Fact Sheet for Healthcare Providers: SeriousBroker.it  This test is not yet approved or cleared by the [place]  FDA and has been authorized for detection and/or diagnosis of SARS-CoV-2 by FDA under an Emergency Use Authorization (EUA). This EUA will remain in effect (meaning this test can be used) for the duration of the COVID-19 declaration under Section 564(b)(1) of the Act, 21 U.S.C. section 360bbb-3(b)(1), unless the authorization is terminated or revoked.     Resp Syncytial Virus by PCR NEGATIVE NEGATIVE Final    Comment: (NOTE) Fact Sheet for Patients: BloggerCourse.com  Fact Sheet for Healthcare Providers: SeriousBroker.it  This test is not yet approved or cleared by the [place]  FDA and has been authorized for detection and/or diagnosis of SARS-CoV-2 by FDA under an Emergency Use Authorization (EUA). This EUA will remain in effect (meaning this test can be used) for the duration of the COVID-19 declaration under Section 564(b)(1) of the Act, 21 U.S.C. section 360bbb-3(b)(1), unless the authorization is terminated or revoked.  Performed at [facility] [place], [address] [place], [place] [address]    Time coordinating discharge:  48 mins  SIGNED:  Faustino Hook, MD  [facility] 03/30/2024, 10:29 AM How to contact the [facility] Attending or Consulting provider 7A - 7P or covering provider during after hours 7P -7A, for this patient?  Check the care team in [facility] and look for a) attending/consulting [facility] provider listed and b) the [facility] team listed Log into [email or web address] and use [facility]'s universal password to access. If you do not have the password, please contact the hospital operator. Locate the [facility] provider you are looking for under [facility] and page to a number that you can be directly reached. If you still have  difficulty reaching the provider, please page the [facility] (Director on Call) for the Hospitalists listed on amion for assistance.

## 2024-03-30 NOTE — Plan of Care (Signed)
  Problem: Education: Goal: Knowledge of General Education information will improve Description: Including pain rating scale, medication(s)/side effects and non-pharmacologic comfort measures Outcome: Progressing   Problem: Health Behavior/Discharge Planning: Goal: Ability to manage health-related needs will improve Outcome: Not Met (add Reason)   Problem: Clinical Measurements: Goal: Ability to maintain clinical measurements within normal limits will improve Outcome: Not Met (add Reason) Goal: Respiratory complications will improve Outcome: Not Met (add Reason)   Problem: Activity: Goal: Risk for activity intolerance will decrease Outcome: Not Met (add Reason)   Problem: Coping: Goal: Level of anxiety will decrease Outcome: Not Met (add Reason)

## 2024-03-30 NOTE — Progress Notes (Signed)
 Pt reported pharmacy did not stock oxycodone  / Apap 5/300 and requested Rx be sent to [facility].  I called her pharmacy to confirm that it was true and once confirmed, I sent Rx to [facility]

## 2024-03-30 NOTE — Plan of Care (Signed)

## 2024-03-31 ENCOUNTER — Encounter (HOSPITAL_COMMUNITY): Payer: Self-pay

## 2024-03-31 ENCOUNTER — Emergency Department (HOSPITAL_COMMUNITY): Payer: MEDICAID

## 2024-03-31 ENCOUNTER — Other Ambulatory Visit: Payer: Self-pay

## 2024-03-31 ENCOUNTER — Other Ambulatory Visit: Payer: Self-pay | Admitting: Family Medicine

## 2024-03-31 ENCOUNTER — Ambulatory Visit: Payer: Self-pay | Admitting: Family Medicine

## 2024-03-31 ENCOUNTER — Inpatient Hospital Stay (HOSPITAL_COMMUNITY)
Admission: EM | Admit: 2024-03-31 | Discharge: 2024-04-04 | DRG: 189 | Disposition: A | Discharge status: Home Health | Payer: MEDICAID | Attending: Internal Medicine | Admitting: Internal Medicine

## 2024-03-31 ENCOUNTER — Telehealth: Payer: Self-pay

## 2024-03-31 DIAGNOSIS — Z8249 Family history of ischemic heart disease and other diseases of the circulatory system: Secondary | ICD-10-CM

## 2024-03-31 DIAGNOSIS — Z8781 Personal history of (healed) traumatic fracture: Secondary | ICD-10-CM

## 2024-03-31 DIAGNOSIS — R058 Other specified cough: Secondary | ICD-10-CM

## 2024-03-31 DIAGNOSIS — R7989 Other specified abnormal findings of blood chemistry: Secondary | ICD-10-CM

## 2024-03-31 DIAGNOSIS — Z79899 Other long term (current) drug therapy: Secondary | ICD-10-CM | POA: Diagnosis not present

## 2024-03-31 DIAGNOSIS — G8929 Other chronic pain: Secondary | ICD-10-CM

## 2024-03-31 DIAGNOSIS — D72829 Elevated white blood cell count, unspecified: Secondary | ICD-10-CM | POA: Diagnosis present

## 2024-03-31 DIAGNOSIS — Z808 Family history of malignant neoplasm of other organs or systems: Secondary | ICD-10-CM

## 2024-03-31 DIAGNOSIS — Z87820 Personal history of traumatic brain injury: Secondary | ICD-10-CM

## 2024-03-31 DIAGNOSIS — F1110 Opioid abuse, uncomplicated: Secondary | ICD-10-CM | POA: Diagnosis present

## 2024-03-31 DIAGNOSIS — F339 Major depressive disorder, recurrent, unspecified: Secondary | ICD-10-CM | POA: Diagnosis present

## 2024-03-31 DIAGNOSIS — F1510 Other stimulant abuse, uncomplicated: Secondary | ICD-10-CM | POA: Diagnosis present

## 2024-03-31 DIAGNOSIS — Z823 Family history of stroke: Secondary | ICD-10-CM

## 2024-03-31 DIAGNOSIS — F1910 Other psychoactive substance abuse, uncomplicated: Secondary | ICD-10-CM | POA: Diagnosis present

## 2024-03-31 DIAGNOSIS — M549 Dorsalgia, unspecified: Secondary | ICD-10-CM | POA: Diagnosis present

## 2024-03-31 DIAGNOSIS — F1210 Cannabis abuse, uncomplicated: Secondary | ICD-10-CM | POA: Diagnosis present

## 2024-03-31 DIAGNOSIS — Z5941 Food insecurity: Secondary | ICD-10-CM

## 2024-03-31 DIAGNOSIS — Z9151 Personal history of suicidal behavior: Secondary | ICD-10-CM

## 2024-03-31 DIAGNOSIS — E43 Unspecified severe protein-calorie malnutrition: Secondary | ICD-10-CM | POA: Diagnosis present

## 2024-03-31 DIAGNOSIS — Z8269 Family history of other diseases of the musculoskeletal system and connective tissue: Secondary | ICD-10-CM

## 2024-03-31 DIAGNOSIS — F419 Anxiety disorder, unspecified: Secondary | ICD-10-CM | POA: Diagnosis present

## 2024-03-31 DIAGNOSIS — Z6820 Body mass index (BMI) 20.0-20.9, adult: Secondary | ICD-10-CM

## 2024-03-31 DIAGNOSIS — Z833 Family history of diabetes mellitus: Secondary | ICD-10-CM | POA: Diagnosis not present

## 2024-03-31 DIAGNOSIS — Z820 Family history of epilepsy and other diseases of the nervous system: Secondary | ICD-10-CM | POA: Diagnosis not present

## 2024-03-31 DIAGNOSIS — Z8349 Family history of other endocrine, nutritional and metabolic diseases: Secondary | ICD-10-CM

## 2024-03-31 DIAGNOSIS — J441 Chronic obstructive pulmonary disease with (acute) exacerbation: Principal | ICD-10-CM | POA: Diagnosis present

## 2024-03-31 DIAGNOSIS — Z836 Family history of other diseases of the respiratory system: Secondary | ICD-10-CM

## 2024-03-31 DIAGNOSIS — Z888 Allergy status to other drugs, medicaments and biological substances status: Secondary | ICD-10-CM

## 2024-03-31 DIAGNOSIS — Z9851 Tubal ligation status: Secondary | ICD-10-CM

## 2024-03-31 DIAGNOSIS — Z818 Family history of other mental and behavioral disorders: Secondary | ICD-10-CM

## 2024-03-31 DIAGNOSIS — Z7951 Long term (current) use of inhaled steroids: Secondary | ICD-10-CM

## 2024-03-31 DIAGNOSIS — J849 Interstitial pulmonary disease, unspecified: Secondary | ICD-10-CM | POA: Diagnosis present

## 2024-03-31 DIAGNOSIS — Z8701 Personal history of pneumonia (recurrent): Secondary | ICD-10-CM | POA: Diagnosis not present

## 2024-03-31 DIAGNOSIS — Z9889 Other specified postprocedural states: Secondary | ICD-10-CM

## 2024-03-31 DIAGNOSIS — Z96612 Presence of left artificial shoulder joint: Secondary | ICD-10-CM | POA: Diagnosis present

## 2024-03-31 DIAGNOSIS — F17210 Nicotine dependence, cigarettes, uncomplicated: Secondary | ICD-10-CM | POA: Diagnosis present

## 2024-03-31 DIAGNOSIS — D75839 Thrombocytosis, unspecified: Secondary | ICD-10-CM | POA: Diagnosis present

## 2024-03-31 DIAGNOSIS — J9621 Acute and chronic respiratory failure with hypoxia: Principal | ICD-10-CM | POA: Diagnosis present

## 2024-03-31 DIAGNOSIS — M545 Low back pain, unspecified: Secondary | ICD-10-CM

## 2024-03-31 DIAGNOSIS — F1410 Cocaine abuse, uncomplicated: Secondary | ICD-10-CM | POA: Diagnosis present

## 2024-03-31 DIAGNOSIS — Z98891 History of uterine scar from previous surgery: Secondary | ICD-10-CM

## 2024-03-31 DIAGNOSIS — I259 Chronic ischemic heart disease, unspecified: Secondary | ICD-10-CM | POA: Diagnosis present

## 2024-03-31 LAB — CBC
HCT: 40.4 % (ref 36.0–46.0)
Hemoglobin: 13 g/dL (ref 12.0–15.0)
MCH: 29.3 pg (ref 26.0–34.0)
MCHC: 32.2 g/dL (ref 30.0–36.0)
MCV: 91.2 fL (ref 80.0–100.0)
Platelets: 552 10*3/uL — ABNORMAL HIGH (ref 150–400)
RBC: 4.43 MIL/uL (ref 3.87–5.11)
RDW: 13.2 % (ref 11.5–15.5)
WBC: 20.8 10*3/uL — ABNORMAL HIGH (ref 4.0–10.5)
nRBC: 0 % (ref 0.0–0.2)

## 2024-03-31 LAB — BASIC METABOLIC PANEL WITH GFR
Anion gap: 7 (ref 5–15)
BUN: 21 mg/dL (ref 8–23)
CO2: 32 mmol/L (ref 22–32)
Calcium: 9 mg/dL (ref 8.9–10.3)
Chloride: 98 mmol/L (ref 98–111)
Creatinine, Ser: 0.51 mg/dL (ref 0.44–1.00)
GFR, Estimated: >60 mL/min (ref >60)
Glucose, Bld: 100 mg/dL — ABNORMAL HIGH (ref 70–99)
Potassium: 3.9 mmol/L (ref 3.5–5.1)
Sodium: 137 mmol/L (ref 135–145)

## 2024-03-31 LAB — TROPONIN I (HIGH SENSITIVITY)
Troponin I (High Sensitivity): 41 ng/L — ABNORMAL HIGH (ref <18)
Troponin I (High Sensitivity): 50 ng/L — ABNORMAL HIGH (ref <18)

## 2024-03-31 MED ORDER — CITALOPRAM HYDROBROMIDE 20 MG PO TABS
20.0000 mg | ORAL_TABLET | Freq: Every day | ORAL | Status: DC
  Administered 2024-04-01 – 2024-04-04 (×4): 20 mg via ORAL
  Filled 2024-03-31 (×4): qty 1

## 2024-03-31 MED ORDER — IPRATROPIUM-ALBUTEROL 0.5-2.5 (3) MG/3ML IN SOLN
3.0000 mL | Freq: Four times a day (QID) | RESPIRATORY_TRACT | Status: DC
  Administered 2024-03-31 – 2024-04-01 (×5): 3 mL via RESPIRATORY_TRACT
  Filled 2024-03-31 (×4): qty 3

## 2024-03-31 MED ORDER — METHYLPREDNISOLONE SODIUM SUCC 125 MG IJ SOLR: 60.0000 mg | Freq: Three times a day (TID) | INTRAMUSCULAR | Status: DC

## 2024-03-31 MED ORDER — ONDANSETRON HCL 4 MG PO TABS: 4.0000 mg | ORAL_TABLET | Freq: Four times a day (QID) | ORAL | Status: DC | PRN

## 2024-03-31 MED ORDER — IPRATROPIUM BROMIDE 0.02 % IN SOLN
0.5000 mg | Freq: Once | RESPIRATORY_TRACT | Status: AC
  Administered 2024-03-31: 0.5 mg via RESPIRATORY_TRACT
  Filled 2024-03-31: qty 2.5

## 2024-03-31 MED ORDER — ALPRAZOLAM 0.5 MG PO TABS
1.0000 mg | ORAL_TABLET | Freq: Once | ORAL | Status: AC
Start: 2024-03-31 — End: 2024-03-31
  Administered 2024-03-31: 1 mg via ORAL
  Filled 2024-03-31: qty 2

## 2024-03-31 MED ORDER — ALBUTEROL SULFATE (2.5 MG/3ML) 0.083% IN NEBU
5.0000 mg | INHALATION_SOLUTION | Freq: Once | RESPIRATORY_TRACT | Status: AC
  Administered 2024-03-31: 5 mg via RESPIRATORY_TRACT
  Filled 2024-03-31: qty 6

## 2024-03-31 MED ORDER — OXYCODONE-ACETAMINOPHEN 5-325 MG PO TABS
1.0000 | ORAL_TABLET | Freq: Four times a day (QID) | ORAL | Status: DC | PRN
  Administered 2024-03-31 – 2024-04-03 (×9): 1 via ORAL
  Filled 2024-03-31 (×9): qty 1

## 2024-03-31 MED ORDER — OXYCODONE-ACETAMINOPHEN 5-325 MG PO TABS
1.0000 | ORAL_TABLET | Freq: Once | ORAL | Status: AC
  Administered 2024-03-31: 1 via ORAL
  Filled 2024-03-31: qty 1

## 2024-03-31 MED ORDER — ALBUTEROL SULFATE (2.5 MG/3ML) 0.083% IN NEBU: 2.5000 mg | INHALATION_SOLUTION | RESPIRATORY_TRACT | Status: DC | PRN

## 2024-03-31 MED ORDER — ACETAMINOPHEN 650 MG RE SUPP: 650.0000 mg | Freq: Four times a day (QID) | RECTAL | Status: DC | PRN

## 2024-03-31 MED ORDER — ENOXAPARIN SODIUM 40 MG/0.4ML IJ SOSY
40.0000 mg | PREFILLED_SYRINGE | INTRAMUSCULAR | Status: DC
  Administered 2024-03-31 – 2024-04-03 (×4): 40 mg via SUBCUTANEOUS
  Filled 2024-03-31 (×4): qty 0.4

## 2024-03-31 MED ORDER — PREDNISONE 50 MG PO TABS
60.0000 mg | ORAL_TABLET | Freq: Once | ORAL | Status: AC
  Administered 2024-03-31: 60 mg via ORAL
  Filled 2024-03-31: qty 1

## 2024-03-31 MED ORDER — AZITHROMYCIN 250 MG PO TABS
500.0000 mg | ORAL_TABLET | Freq: Every day | ORAL | Status: AC
  Administered 2024-03-31 – 2024-04-04 (×5): 500 mg via ORAL
  Filled 2024-03-31 (×5): qty 2

## 2024-03-31 MED ORDER — ONDANSETRON HCL 4 MG/2ML IJ SOLN
4.0000 mg | Freq: Four times a day (QID) | INTRAMUSCULAR | Status: DC | PRN
Start: 2024-03-31 — End: 2024-04-04

## 2024-03-31 MED ORDER — ACETAMINOPHEN 325 MG PO TABS
650.0000 mg | ORAL_TABLET | Freq: Four times a day (QID) | ORAL | Status: DC | PRN
  Administered 2024-04-01: 650 mg via ORAL
  Filled 2024-03-31: qty 2

## 2024-03-31 MED ORDER — ENSURE ENLIVE PO LIQD
237.0000 mL | Freq: Three times a day (TID) | ORAL | Status: DC
  Administered 2024-03-31 – 2024-04-04 (×10): 237 mL via ORAL
  Filled 2024-03-31 (×9): qty 237

## 2024-03-31 MED ORDER — ALPRAZOLAM 1 MG PO TABS
1.0000 mg | ORAL_TABLET | Freq: Three times a day (TID) | ORAL | Status: DC | PRN
  Administered 2024-04-01 – 2024-04-03 (×7): 1 mg via ORAL
  Filled 2024-03-31 (×2): qty 1
  Filled 2024-03-31: qty 2
  Filled 2024-03-31 (×3): qty 1
  Filled 2024-03-31: qty 2
  Filled 2024-03-31: qty 1

## 2024-03-31 MED ORDER — SODIUM CHLORIDE 0.9 % IV SOLN
500.0000 mg | Freq: Once | INTRAVENOUS | Status: AC
  Administered 2024-03-31: 500 mg via INTRAVENOUS
  Filled 2024-03-31: qty 5

## 2024-03-31 MED ORDER — GUAIFENESIN-DM 100-10 MG/5ML PO SYRP: 5.0000 mL | ORAL_SOLUTION | ORAL | Status: DC | PRN

## 2024-03-31 MED ORDER — METHYLPREDNISOLONE SODIUM SUCC 125 MG IJ SOLR: 90.0000 mg | Freq: Two times a day (BID) | INTRAMUSCULAR | Status: DC

## 2024-03-31 MED ORDER — SODIUM CHLORIDE 0.9 % IV SOLN
1.0000 g | Freq: Once | INTRAVENOUS | Status: AC
  Administered 2024-03-31: 1 g via INTRAVENOUS
  Filled 2024-03-31: qty 10

## 2024-03-31 MED ORDER — IOHEXOL 300 MG/ML  SOLN
80.0000 mL | Freq: Once | INTRAMUSCULAR | Status: AC | PRN
  Administered 2024-03-31: 80 mL via INTRAVENOUS

## 2024-03-31 MED ORDER — BUDESONIDE 0.25 MG/2ML IN SUSP
0.2500 mg | Freq: Two times a day (BID) | RESPIRATORY_TRACT | Status: DC
  Administered 2024-03-31 – 2024-04-04 (×8): 0.25 mg via RESPIRATORY_TRACT
  Filled 2024-03-31 (×8): qty 2

## 2024-03-31 MED ORDER — OXYCODONE-ACETAMINOPHEN 5-325 MG PO TABS: 1.0000 | ORAL_TABLET | Freq: Four times a day (QID) | ORAL | 0 refills | Status: AC | PRN

## 2024-03-31 NOTE — Assessment & Plan Note (Signed)
 Consult nutrition for evaluation PT and OT.

## 2024-03-31 NOTE — Progress Notes (Signed)
 Received notice from [facility], that the oxy/apap 5/300 not available and cost would be more than $700.  Requested change to 5/325 percocet and new Rx sent for 15 tabs on 03/31/24.    Olga Berthold MD

## 2024-03-31 NOTE — Discharge Instructions (Signed)
 It was our pleasure to provide your ER care today - we hope that you feel better.  Take prednisone  as prescribed. Use inhaler/breathing treatment as need.  Avoid any smoking.  Follow up closely with primary care doctor in the coming week.   Return to ER if worse, new symptoms, new/severe pain, chest pain, increased trouble breathing, high fevers, or other concern.

## 2024-03-31 NOTE — ED Notes (Signed)
 RT at bedside.

## 2024-03-31 NOTE — Assessment & Plan Note (Signed)
 Continue citalopram , and alprazolam .

## 2024-03-31 NOTE — Progress Notes (Signed)
 PHARMACIST - PHYSICIAN COMMUNICATION   CONCERNING: Methylprednisolone  IV    Current order: Methylprednisolone  IV 60 mg q8h     DESCRIPTION: Per [place] Protocol:   IV methylprednisolone  will be converted to either a q12h or q24h frequency with the same total daily dose (TDD).  Ordered Dose: 1 to 125 mg TDD; convert to: TDD q24h.  Ordered Dose: 126 to 250 mg TDD; convert to: TDD div q12h.  Ordered Dose: >250 mg TDD; DAW.  Order has been adjusted to: Methylprednisolone  IV 90 q12h   Scotty Cyphers , PharmD Clinical Pharmacist  03/31/2024 9:39 PM

## 2024-03-31 NOTE — Progress Notes (Signed)
 Pt called and said she was discharged from the hospital Thursday 03/30/24 and that she was given percocet but [facility] was not [facility] [place] and she [facility] [place]. I informed her that I would ask Dr. Lincoln Renshaw about  this. Dr. Lincoln Renshaw replied to the message and said he [facility] the [facility] Thursday 03/30/24. I notified pt and she verbalized understanding.

## 2024-03-31 NOTE — ED Provider Notes (Addendum)
 [place] EMERGENCY DEPARTMENT AT [facility] Provider Note   CSN: [id number] Arrival date & time: 03/31/24  1142     History  Chief Complaint  Patient presents with   Shortness of Breath    Casey [name] is a 63 y.o. female.  Patient with hx copd, c/o persistent non productive cough, and increased sob/wheezing in past few weeks. No sore throat. No fever or chills. Chest has felt tight, no episodic chest pain. No pleuritic chest pain. Indicates compliant w home meds. Recent hospitalization for same, d/c yesterday, indicates symptoms persisted at time of d/c from hospital. +smoker.   The history is provided by the patient, medical records and the EMS personnel.  Shortness of Breath Associated symptoms: cough   Associated symptoms: no abdominal pain, no fever, no headaches, no neck pain, no rash, no sore throat and no vomiting        Home Medications Prior to Admission medications   Medication Sig Start Date End Date Taking? Authorizing Provider  acetaminophen  (TYLENOL ) 325 MG tablet Take 2 tablets (650 mg total) by mouth every 6 (six) hours as needed for mild pain (or Fever >/= 101). 08/17/23   Colin Dawley, MD  albuterol  (VENTOLIN  HFA) 108 (90 Base) MCG/ACT inhaler Inhale 2 puffs into the lungs every 4 (four) hours as needed for wheezing or shortness of breath. 03/30/24   Johnson, Clanford L, MD  ALPRAZolam  (XANAX ) 1 MG tablet Take 1 tablet (1 mg total) by mouth 3 (three) times daily as needed for anxiety. 03/30/24   Johnson, Clanford L, MD  citalopram  (CELEXA ) 20 MG tablet Take 1 tablet (20 mg total) by mouth daily. 03/22/24   Demaris Fillers, MD  dextromethorphan -guaiFENesin  (MUCINEX  DM) 30-600 MG 12hr tablet Take 1 tablet by mouth 2 (two) times daily for 3 days. 03/30/24 04/02/24  [name], MD  feeding supplement (ENSURE ENLIVE / ENSURE PLUS) LIQD Take 237 mLs by mouth 3 (three) times daily between meals. 08/17/23   Colin Dawley, MD  ipratropium-albuterol   (DUONEB) 0.5-2.5 (3) MG/3ML SOLN Take 3 mLs by nebulization every 6 (six) hours as needed. Patient taking differently: Take 3 mLs by nebulization every 6 (six) hours as needed (wheezing). 02/10/24   Diamond Formica, MD  methocarbamol  (ROBAXIN ) 500 MG tablet Take 500 mg by mouth every 8 (eight) hours as needed for muscle spasms.    [provider]  oxyCODONE -acetaminophen  (PERCOCET/ROXICET) 5-325 MG tablet Take 1 tablet by mouth every 6 (six) hours as needed for up to 5 days for severe pain (pain score 7-10). 03/31/24 04/05/24  [name], MD  predniSONE  (DELTASONE ) 20 MG tablet Take 3 PO QAM x5days, 2 PO QAM x5days, 1 PO QAM x5days 03/31/24   [name], MD      Allergies    Patient has no known allergies.    Review of Systems   Review of Systems  Constitutional:  Negative for chills and fever.  HENT:  Negative for sore throat.   Eyes:  Negative for redness.  Respiratory:  Positive for cough and shortness of breath.   Cardiovascular:  Negative for leg swelling.  Gastrointestinal:  Negative for abdominal pain and vomiting.  Genitourinary:  Negative for dysuria and flank pain.  Musculoskeletal:  Negative for back pain and neck pain.  Skin:  Negative for rash.  Neurological:  Negative for headaches.    Physical Exam Updated Vital Signs BP 124/69   Pulse 100   Temp 98 F (36.7 C)  Resp (!) 39   Ht 1.651 m (5\' 5" )   Wt 55.7 kg   LMP  (LMP Unknown)   SpO2 94%   BMI 20.43 kg/m  Physical Exam Vitals and nursing note reviewed.  Constitutional:      General: She is in acute distress.     Appearance: She is well-developed.  HENT:     Head: Atraumatic.     Nose: Nose normal.     Mouth/Throat:     Mouth: Mucous membranes are moist.  Eyes:     General: No scleral icterus.    Conjunctiva/sclera: Conjunctivae normal.  Neck:     Trachea: No tracheal deviation.  Cardiovascular:     Rate and Rhythm: Normal rate and regular rhythm.     Pulses: Normal pulses.      Heart sounds: Normal heart sounds. No murmur heard.    No friction rub. No gallop.  Pulmonary:     Effort: Respiratory distress present.     Breath sounds: Wheezing present.  Abdominal:     General: Bowel sounds are normal. There is no distension.     Palpations: Abdomen is soft.     Tenderness: There is no abdominal tenderness.  Genitourinary:    Comments: No cva tenderness.  Musculoskeletal:        General: No swelling or tenderness.     Cervical back: Normal range of motion and neck supple. No rigidity. No muscular tenderness.     Right lower leg: No edema.     Left lower leg: No edema.  Skin:    General: Skin is warm and dry.     Findings: No rash.  Neurological:     Mental Status: She is alert.     Comments: Alert, speech normal.   Psychiatric:        Mood and Affect: Mood normal.     ED Results / Procedures / Treatments   Labs (all labs ordered are listed, but only abnormal results are displayed) Results for orders placed or performed during the hospital encounter of 03/31/24  Basic metabolic panel   Collection Time: 03/31/24 12:06 PM  Result Value Ref Range   Sodium 137 135 - 145 mmol/L   Potassium 3.9 3.5 - 5.1 mmol/L   Chloride 98 98 - 111 mmol/L   CO2 32 22 - 32 mmol/L   Glucose, Bld 100 (H) 70 - 99 mg/dL   BUN 21 8 - 23 mg/dL   Creatinine, Ser 2.44 0.44 - 1.00 mg/dL   Calcium  9.0 8.9 - 10.3 mg/dL   GFR, Estimated >01 >02 mL/min   Anion gap 7 5 - 15  CBC   Collection Time: 03/31/24 12:06 PM  Result Value Ref Range   WBC 20.8 (H) 4.0 - 10.5 K/uL   RBC 4.43 3.87 - 5.11 MIL/uL   Hemoglobin 13.0 12.0 - 15.0 g/dL   HCT 72.5 36.6 - 44.0 %   MCV 91.2 80.0 - 100.0 fL   MCH 29.3 26.0 - 34.0 pg   MCHC 32.2 30.0 - 36.0 g/dL   RDW 34.7 42.5 - 95.6 %   Platelets 552 (H) 150 - 400 K/uL   nRBC 0.0 0.0 - 0.2 %  Troponin I (High Sensitivity)   Collection Time: 03/31/24 12:06 PM  Result Value Ref Range   Troponin I (High Sensitivity) 41 (H) <18 ng/L   DG  Chest 2 View Result Date: 03/31/2024 CLINICAL DATA:  Shortness of breath.  Cough.  COPD EXAM: CHEST - 2 VIEW  COMPARISON:  X-ray 03/28/2024 and older FINDINGS: Underinflation. No pneumothorax or effusion. Normal cardiopericardial silhouette. Subtle asymmetric opacity at the left lung base. Infiltrates possible. Recommend follow-up. Degenerative changes of the spine. Overlapping cardiac leads. Left shoulder reverse arthroplasty. IMPRESSION: Subtle left lung base opacity. Infiltrates possible. Recommend follow-up Electronically Signed   By: Adrianna Horde M.D.   On: 03/31/2024 14:12   DG Chest Port 1 View Result Date: 03/28/2024 CLINICAL DATA:  Shortness of breath. EXAM: PORTABLE CHEST 1 VIEW COMPARISON:  03/19/2024 FINDINGS: Chronic coarse lung markings. No new airspace disease or lung consolidation. No overt pulmonary edema. Old left rib fractures. Left shoulder arthroplasty. Heart size is normal. Negative for a pneumothorax. IMPRESSION: Chronic lung changes without acute findings. Electronically Signed   By: Elene Griffes M.D.   On: 03/28/2024 10:52   DG CHEST PORT 1 VIEW Result Date: 03/19/2024 CLINICAL DATA:  Acute on chronic respiratory failure and hypoxia EXAM: PORTABLE CHEST 1 VIEW COMPARISON:  03/18/2024 FINDINGS: Heart size and mediastinal contours are stable. Diffuse chronic coarsened interstitial markings with bronchial wall thickening is again noted. Interstitial edema pattern has decreased from the previous exam. No pleural effusion or pneumothorax. Multiple chronic remote left rib fractures. Status post left shoulder arthroplasty. IMPRESSION: 1. Improvement in interstitial edema pattern. Electronically Signed   By: Kimberley Penman M.D.   On: 03/19/2024 09:41   DG CHEST PORT 1 VIEW Result Date: 03/18/2024 CLINICAL DATA:  Acute respiratory failure with hypoxia. Shortness of breath. EXAM: PORTABLE CHEST 1 VIEW COMPARISON:  Chest radiograph yesterday FINDINGS: The heart is stable in size. Unchanged  mediastinal contours. Worsening ill-defined opacities throughout the left lung, with developing ill-defined right perihilar opacity. No pneumothorax or significant pleural effusion. Remote left rib fractures. IMPRESSION: Worsening ill-defined opacities throughout the left lung, with developing ill-defined right perihilar opacity. Findings may represent asymmetric edema or infection, including atypical organisms. Electronically Signed   By: Chadwick Colonel M.D.   On: 03/18/2024 16:33   DG Chest Port 1 View Result Date: 03/17/2024 CLINICAL DATA:  Shortness of breath EXAM: PORTABLE CHEST 1 VIEW COMPARISON:  Chest radiograph 11/06/2023 FINDINGS: Patient is rotated to the left. Monitoring leads overlie the patient. Stable cardiac and mediastinal contours. Increased heterogeneous opacities left mid and lower lung. Persistent heterogeneous opacities right lung base. Biapical pleural-parenchymal thickening. No pleural effusion or pneumothorax. IMPRESSION: Increased heterogeneous opacities left mid and lower lung may represent infection in the appropriate clinical setting. Followup PA and lateral chest X-ray is recommended in 3-4 weeks following trial of antibiotic therapy to ensure resolution and exclude underlying malignancy. Electronically Signed   By: Jone Neither M.D.   On: 03/17/2024 10:09     EKG EKG Interpretation Date/Time:  Friday Mar 31 2024 12:24:46 EDT Ventricular Rate:  102 PR Interval:  157 QRS Duration:  92 QT Interval:  358 QTC Calculation: 467 R Axis:   21  Text Interpretation: Sinus tachycardia Atrial premature complex LVH with secondary repolarization abnormality Non-specific ST-t changes Confirmed by Guadalupe Lee ([id number]) on 03/31/2024 1:00:45 PM  Radiology DG Chest 2 View Result Date: 03/31/2024 CLINICAL DATA:  Shortness of breath.  Cough.  COPD EXAM: CHEST - 2 VIEW COMPARISON:  X-ray 03/28/2024 and older FINDINGS: Underinflation. No pneumothorax or effusion. Normal cardiopericardial  silhouette. Subtle asymmetric opacity at the left lung base. Infiltrates possible. Recommend follow-up. Degenerative changes of the spine. Overlapping cardiac leads. Left shoulder reverse arthroplasty. IMPRESSION: Subtle left lung base opacity. Infiltrates possible. Recommend follow-up Electronically Signed   By: Alice Innocent  Venice Gillis M.D.   On: 03/31/2024 14:12    Procedures Procedures    Medications Ordered in ED Medications  albuterol  (PROVENTIL ) (2.5 MG/3ML) 0.083% nebulizer solution 5 mg (has no administration in time range)  cefTRIAXone  (ROCEPHIN ) 1 g in sodium chloride  0.9 % 100 mL IVPB (has no administration in time range)  azithromycin  (ZITHROMAX ) 500 mg in sodium chloride  0.9 % 250 mL IVPB (has no administration in time range)  oxyCODONE -acetaminophen  (PERCOCET/ROXICET) 5-325 MG per tablet 1 tablet (1 tablet Oral Given 03/31/24 1337)  albuterol  (PROVENTIL ) (2.5 MG/3ML) 0.083% nebulizer solution 5 mg (5 mg Nebulization Given 03/31/24 1355)  ipratropium (ATROVENT ) nebulizer solution 0.5 mg (0.5 mg Nebulization Given 03/31/24 1355)  predniSONE  (DELTASONE ) tablet 60 mg (60 mg Oral Given 03/31/24 1337)    ED Course/ Medical Decision Making/ A&P                                 Medical Decision Making Problems Addressed: Acute on chronic respiratory failure with hypoxia [facility]): acute illness or injury with systemic symptoms that poses a threat to life or bodily functions    Details: Acute/chronic COPD exacerbation (HCC): acute illness or injury with systemic symptoms that poses a threat to life or bodily functions Non-productive cough: acute illness or injury  Amount and/or Complexity of Data Reviewed Independent Historian: EMS    Details: hx External Data Reviewed: notes. Labs: ordered. Radiology: ordered and independent interpretation performed. Decision-making details documented in ED Course. ECG/medicine tests: ordered and independent interpretation performed. Decision-making details  documented in ED Course.  Risk Prescription drug management. Decision regarding hospitalization.   Iv ns. Continuous pulse ox and cardiac monitoring. Labs ordered/sent. Imaging ordered.   Differential diagnosis includes copd exacerbation, pna, etc. Dispo decision including potential need for admission considered - will get labs and imaging and reassess.   Reviewed nursing notes and prior charts for additional history. External reports reviewed. Additional history from: EMS.   Albuterol  and atrovent  neb. Pt indicates does not want solumedrol but can take prednisone  and has not had it yet today. Prednisone  po.   Cardiac monitor: sinus rhythm, rate 90.  Labs reviewed/interpreted by me - wbc 20, recently noted similarly high and is on steroid therapy. Hct 40. Chem normal.  Trop mildly elevated - pt currently denies any chest discomfort. Repeat ordered.   Xrays reviewed/interpreted by me - ?left infiltrate. Rocephin  and zithromax  iv.   CT reviewed/interpreted by me - pnd.   1505, ct pending, signed out to DR [facility] to check CT, recheck post nebs - anticipate will likely require admission.   CRITICAL CARE RE: Acute on chronic respiratory failure with hypoxia, copd exacerbation Performed by: Moiz Ryant E Kassondra Geil Total critical care time: 40 minutes Critical care time was exclusive of separately billable procedures and treating other patients. Critical care was necessary to treat or prevent imminent or life-threatening deterioration. Critical care was time spent personally by me on the following activities: development of treatment plan with patient and/or surrogate as well as nursing, discussions with consultants, evaluation of patient's response to treatment, examination of patient, obtaining history from patient or surrogate, ordering and performing treatments and interventions, ordering and review of laboratory studies, ordering and review of radiographic studies, pulse oximetry and re-evaluation  of patient's condition.          Final Clinical Impression(s) / ED Diagnoses Final diagnoses:  COPD exacerbation (HCC)  Non-productive cough  Acute on chronic respiratory failure  with hypoxia (HCC)  Elevated troponin    Rx / DC Orders ED Discharge Orders     None          Guadalupe Lee, MD 03/31/24 [id number]

## 2024-03-31 NOTE — ED Triage Notes (Signed)
 0pt recently dc from hospital due to Sob and is now back here for evaluation of new and persistent cough. Pt BIB [facility] from home. Pt received dueneb with EMS. 20 g RAC. Refused solumedrol. 91 RA in triage, 3 L chronic at home

## 2024-03-31 NOTE — H&P (Signed)
 History and Physical    Patient: Casey [name]:[id number] DOB: 1961/04/04 DOA: 03/31/2024 DOS: the patient was seen and examined on 03/31/2024 PCP: Zarwolo, Gloria, FNP  Patient coming from: Home  Chief Complaint:  Chief Complaint  Patient presents with   Shortness of Breath   HPI: Casey [name] is a 63 y.o. female with medical history significant of COPD, depression, chronic back pain, anxiety and polysubstance abuse who presented with dyspnea.  Patient was discharged from the hospital yesterday, after a  2 day hospitalization for COPD exacerbation. She was treated with bronchodilator therapy and systemic corticosteroids.  At the time of her discharge she was feeling back to her baseline. Unfortunately at home she developed worsening dyspnea, rapidly progressive to the point where she was not able to do any minimal exertion without feeling symptoms. Her dyspnea has been associated with cough and wheezing, no chest pain, no fever or chills.  She has not been able to ambulate due to dyspnea.  She used her bronchodilator therapy and supplemental 02 per [place] (2L/min) with no improvement in her symptoms.   This is her #3 hospitalization for COPD this month, the first one was on 03/17/24. Looks like she has not been able to fully recover. Her home has no mold but has significant dust because she has not been able clean. She leaves alone and takes opiates analgesics for chronic pain.   Review of Systems: As mentioned in the history of present illness. All other systems reviewed and are negative. Past Medical History:  Diagnosis Date   Anxiety    Chronic back pain    COPD (chronic obstructive pulmonary disease) (HCC)    Depression    History of panic attacks 09/11/2014   Medical history non-contributory    Pneumonia    Polysubstance abuse (HCC)    History of taking non-prescribed opiates, BZDs; also +cocaine and THC   Ruptured disc, thoracic    Suicide attempt (HCC) 2002   Past  Surgical History:  Procedure Laterality Date   CESAREAN SECTION     CLAVICLE SURGERY     REVERSE SHOULDER ARTHROPLASTY Left 01/01/2020   Procedure: LEFT REVERSE SHOULDER ARTHROPLASTY;  Surgeon: Jasmine Mesi, MD;  Location: [facility] OR;  Service: Orthopedics;  Laterality: Left;   TUBAL LIGATION     Social History:  reports that she has been smoking cigarettes. She has never used smokeless tobacco. She reports current alcohol use of about 1.0 standard drink of alcohol per week. She reports that she does not currently use drugs after having used the following drugs: "Crack" cocaine and Benzodiazepines.  No Known Allergies  Family History  Problem Relation Age of Onset   Diabetes Mother    Pulmonary fibrosis Mother    Heart attack Father    Parkinson's disease Sister    Bipolar disorder Sister    Thyroid  disease Sister    Heart attack Brother    Other Son        vascular neucrosis   Pulmonary fibrosis Maternal Grandmother    Diabetes Maternal Grandfather    Stroke Maternal Grandfather    Pneumonia Paternal Grandfather    Cancer Sister        melonoma   Other Brother        back problems, nerve problems, soft bones    Prior to Admission medications   Medication Sig Start Date End Date Taking? Authorizing Provider  acetaminophen  (TYLENOL ) 325 MG tablet Take 2 tablets (650 mg total) by mouth every 6 (  six) hours as needed for mild pain (or Fever >/= 101). 08/17/23   Colin Dawley, MD  albuterol  (VENTOLIN  HFA) 108 (90 Base) MCG/ACT inhaler Inhale 2 puffs into the lungs every 4 (four) hours as needed for wheezing or shortness of breath. 03/30/24   Johnson, Clanford L, MD  ALPRAZolam  (XANAX ) 1 MG tablet Take 1 tablet (1 mg total) by mouth 3 (three) times daily as needed for anxiety. 03/30/24   Johnson, Clanford L, MD  citalopram  (CELEXA ) 20 MG tablet Take 1 tablet (20 mg total) by mouth daily. 03/22/24   Demaris Fillers, MD  dextromethorphan -guaiFENesin  (MUCINEX  DM) 30-600 MG 12hr tablet Take 1  tablet by mouth 2 (two) times daily for 3 days. 03/30/24 04/02/24  [name], MD  feeding supplement (ENSURE ENLIVE / ENSURE PLUS) LIQD Take 237 mLs by mouth 3 (three) times daily between meals. 08/17/23   Colin Dawley, MD  ipratropium-albuterol  (DUONEB) 0.5-2.5 (3) MG/3ML SOLN Take 3 mLs by nebulization every 6 (six) hours as needed. Patient taking differently: Take 3 mLs by nebulization every 6 (six) hours as needed (wheezing). 02/10/24   Diamond Formica, MD  methocarbamol  (ROBAXIN ) 500 MG tablet Take 500 mg by mouth every 8 (eight) hours as needed for muscle spasms.    [provider]  oxyCODONE -acetaminophen  (PERCOCET/ROXICET) 5-325 MG tablet Take 1 tablet by mouth every 6 (six) hours as needed for up to 5 days for severe pain (pain score 7-10). 03/31/24 04/05/24  [name], MD  predniSONE  (DELTASONE ) 20 MG tablet Take 3 PO QAM x5days, 2 PO QAM x5days, 1 PO QAM x5days 03/31/24   [name], MD    Physical Exam: Vitals:   03/31/24 1609 03/31/24 1615 03/31/24 1900 03/31/24 1915  BP: (!) 91/58  125/74 123/77  Pulse: 94  (!) 109 94  Resp: (!) 21  (!) 22   Temp: 97.7 F (36.5 C)     TempSrc: Axillary     SpO2: 90% 92% 94% 93%  Weight:      Height:       Neurology awake and alert ENT with mild pallor Cardiovascular with S1 and S2 present and regular with no gallops or rubs, no murmurs Respiratory with prolonged expiratory phase, positive expiratory wheezing and rhonchi, scattered rales. Noted increased work of breathing with accessory muscle use. Decreased breath sounds bilaterally  Abdomen with no distention  No lower extremity edema  Data Reviewed:   Na 137, K 3,9 Cl 98 bicarbonate 32 glucose 100 bun 21 cr 0,51  High sensitive troponin 41 and 50  Wbc 20,8 hgb 13,0 plt 552   Chest radiograph with no cardiomegaly, positive hyperinflation, left lowe lobe round opacity, with no effusions.  CT chest with bilateral focal dense ground glass opacities,  more at the lower lobes. Mild diffuse peribronchial wall thickening and scattered subsegmental mucous plugging.  New superior endplate compression fracture T 3 age indeterminate.  Unchanged 11 mm right adrenal nodule.  Coronary artery calcification  No central pulmonary emboli.   EKG 102 bpm, normal axis, normal intervals, qtc  482, sinus rhythm with no significant ST segment changes, negative T wave lead I and aVL.     Assessment and Plan: * COPD exacerbation (HCC) Patient with recurrent hospitalization for respiratory failure, related to COPD.  Her chest CT with no central pulmonary embolism, she has persistent ground glass opacities, bilaterally per CT scan, similar to 2024 scan, suspicious for ILD.  Currently requiring 3 L/min per [place]   Plan is  to continue bronchodilator therapy and supplemental 02 per [place] to keep 02 saturation 88% or greater.  Will resume systemic corticosteroids at high dose 60 mg q 8 hrs  Inhaled corticosteroids.  Incentive spirometer and flutter valve.  Antibiotic therapy with azithromycin .  Re check viral panel  Patient will need high resolution CT and pulmonary follow up as outpatient.   Depression, recurrent (HCC) Continue citalopram , and alprazolam .   Severe protein-calorie malnutrition (HCC) Consult nutrition for evaluation PT and OT.   Chronic back pain Continue pain control with oxycodone ,    Advance Care Planning:   Code Status: Full Code   Consults: none   Family Communication: no family at the bedside   Severity of Illness: The appropriate patient status for this patient is INPATIENT. Inpatient status is judged to be reasonable and necessary in order to provide the required intensity of service to ensure the patient's safety. The patient's presenting symptoms, physical exam findings, and initial radiographic and laboratory data in the context of their chronic comorbidities is felt to place them at high risk for further clinical deterioration.  Furthermore, it is not anticipated that the patient will be medically stable for discharge from the hospital within 2 midnights of admission.   * I certify that at the point of admission it is my clinical judgment that the patient will require inpatient hospital care spanning beyond 2 midnights from the point of admission due to high intensity of service, high risk for further deterioration and high frequency of surveillance required.*  Author: Albertus Alt, MD 03/31/2024 8:54 PM  For on call review [email or web address].

## 2024-03-31 NOTE — Transitions of Care (Post Inpatient/ED Visit) (Signed)
   03/31/2024  Name: Casey [name] MRN: [id number] DOB: 06/24/1961  Today's TOC FU Call Status: Today's TOC FU Call Status:: Unsuccessful Call (1st Attempt) Unsuccessful Call (1st Attempt) Date: 03/31/24  Attempted to reach the patient regarding the most recent Inpatient/ED visit.  Patient discharged from [facility] 5/22 - COPD - TOC RN made 1st attempt - Female answered and was coughing non-stop and call ended [facility] RN made immediate 2nd attempt and patient was coughing nonstop and was unable to talk more than a couple words between coughing - TOC RN called 911 was called -dispatch was advising patient help was on the way, patient continued non-stop coughing -  paramedics arrived - ended call  Follow Up Plan: Additional outreach attempts will be made to reach the patient to complete the Transitions of Care (Post Inpatient/ED visit) call.   Tonia Frankel RN, CCM [place]  VBCI-Population Health RN Care Manager [phone]

## 2024-03-31 NOTE — Assessment & Plan Note (Addendum)
 Patient with recurrent hospitalization for respiratory failure, related to COPD.  Her chest CT with no central pulmonary embolism, she has persistent ground glass opacities, bilaterally per CT scan, similar to 2024 scan, suspicious for ILD.  Currently requiring 3 L/min per [place]   Plan is to continue bronchodilator therapy and supplemental 02 per [place] to keep 02 saturation 88% or greater.  Will resume systemic corticosteroids at high dose 60 mg q 8 hrs  Inhaled corticosteroids.  Incentive spirometer and flutter valve.  Antibiotic therapy with azithromycin .  Re check viral panel  Patient will need high resolution CT and pulmonary follow up as outpatient.

## 2024-03-31 NOTE — Assessment & Plan Note (Addendum)
 Continue pain control with oxycodone ,

## 2024-04-01 DIAGNOSIS — J441 Chronic obstructive pulmonary disease with (acute) exacerbation: Secondary | ICD-10-CM | POA: Diagnosis not present

## 2024-04-01 LAB — CBC
HCT: 36.7 % (ref 36.0–46.0)
Hemoglobin: 11.8 g/dL — ABNORMAL LOW (ref 12.0–15.0)
MCH: 29.6 pg (ref 26.0–34.0)
MCHC: 32.2 g/dL (ref 30.0–36.0)
MCV: 92.2 fL (ref 80.0–100.0)
Platelets: 514 10*3/uL — ABNORMAL HIGH (ref 150–400)
RBC: 3.98 MIL/uL (ref 3.87–5.11)
RDW: 13.2 % (ref 11.5–15.5)
WBC: 16.2 10*3/uL — ABNORMAL HIGH (ref 4.0–10.5)
nRBC: 0 % (ref 0.0–0.2)

## 2024-04-01 LAB — RESPIRATORY PANEL BY PCR

## 2024-04-01 LAB — BASIC METABOLIC PANEL WITH GFR
Anion gap: 5 (ref 5–15)
BUN: 19 mg/dL (ref 8–23)
CO2: 29 mmol/L (ref 22–32)
Calcium: 8.6 mg/dL — ABNORMAL LOW (ref 8.9–10.3)
Chloride: 102 mmol/L (ref 98–111)
Creatinine, Ser: 0.39 mg/dL — ABNORMAL LOW (ref 0.44–1.00)
GFR, Estimated: >60 mL/min (ref >60)
Glucose, Bld: 128 mg/dL — ABNORMAL HIGH (ref 70–99)
Potassium: 4.3 mmol/L (ref 3.5–5.1)
Sodium: 136 mmol/L (ref 135–145)

## 2024-04-01 MED ORDER — DEXTROMETHORPHAN POLISTIREX ER 30 MG/5ML PO SUER
15.0000 mg | Freq: Three times a day (TID) | ORAL | Status: DC | PRN
  Administered 2024-04-01 – 2024-04-03 (×5): 15 mg via ORAL
  Filled 2024-04-01 (×6): qty 5

## 2024-04-01 MED ORDER — PREDNISONE 20 MG PO TABS
60.0000 mg | ORAL_TABLET | Freq: Three times a day (TID) | ORAL | Status: DC
  Administered 2024-04-01 – 2024-04-03 (×8): 60 mg via ORAL
  Filled 2024-04-01 (×2): qty 3
  Filled 2024-04-01: qty 1
  Filled 2024-04-01 (×2): qty 3
  Filled 2024-04-01: qty 1
  Filled 2024-04-01 (×2): qty 3

## 2024-04-01 MED ORDER — GUAIFENESIN ER 600 MG PO TB12
1200.0000 mg | ORAL_TABLET | Freq: Two times a day (BID) | ORAL | Status: DC
  Administered 2024-04-01 – 2024-04-04 (×7): 1200 mg via ORAL
  Filled 2024-04-01 (×7): qty 2

## 2024-04-01 MED ORDER — IPRATROPIUM-ALBUTEROL 0.5-2.5 (3) MG/3ML IN SOLN
3.0000 mL | Freq: Three times a day (TID) | RESPIRATORY_TRACT | Status: DC
  Administered 2024-04-02 – 2024-04-04 (×7): 3 mL via RESPIRATORY_TRACT
  Filled 2024-04-01 (×7): qty 3

## 2024-04-01 NOTE — TOC Initial Note (Signed)
 Transition of Care [facility]) - Initial/Assessment Note    Patient Details  Name: Casey [name] MRN: [id number] Date of Birth: [birth year]  [facility] CM/SW Contact:    Lynda Sands, RN Phone Number: 04/01/2024, 5:05 PM  Clinical Narrative:  CM met with patient at bedside. Patient  lives alone in a single family home, 1 step to enter home.   Patient reports she  needs assistance with adl's . Patient ambulate without assistance. DME: rolling  walker not in use. Oxygen at 2l/min [facility] is oxygen provider. Patient asked about home services. CM explained to patient she will need to follow up  with her Social worker that provide that [employer] aide.    Expected Discharge Plan: Home w Home Health Services     Patient Goals and CMS Choice Patient states their goals for this hospitalization and ongoing recovery are:: Discharge home with help CMS Medicare.gov Compare Post Acute Care list provided to:: Patient        Expected Discharge Plan and Services       Living arrangements for the past 2 months: Single Family Home                                      Prior Living Arrangements/Services Living arrangements for the past 2 months: Single Family Home Lives with:: Self Patient language and need for interpreter reviewed:: Yes        Need for Family Participation in Patient Care: Yes (Comment) Care giver support system in place?: Yes (comment)   Criminal Activity/Legal Involvement Pertinent to Current Situation/Hospitalization: No - Comment as needed  Activities of Daily Living   ADL Screening (condition at time of admission) Independently performs ADLs?: No Does the patient have a NEW difficulty with bathing/dressing/toileting/self-feeding that is expected to last >3 days?: Yes (Initiates electronic notice to provider for possible OT consult) Does the patient have a NEW difficulty with getting in/out of bed, walking, or climbing stairs that is expected to  last >3 days?: Yes (Initiates electronic notice to provider for possible PT consult) Does the patient have a NEW difficulty with communication that is expected to last >3 days?: No Is the patient deaf or have difficulty hearing?: No Does the patient have difficulty seeing, even when wearing glasses/contacts?: No Does the patient have difficulty concentrating, remembering, or making decisions?: No  Permission Sought/Granted      Share Information with NAME: Gerldine Koch     Permission granted to share info w Relationship: Friend  Permission granted to share info w Contact Information: [phone]  Emotional Assessment Appearance:: Appears stated age Attitude/Demeanor/Rapport: Engaged Affect (typically observed): Stable Orientation: : Oriented to Self, Oriented to Place, Oriented to  Time, Oriented to Situation      Admission diagnosis:  Non-productive cough [R05.8] Elevated troponin [R79.89] COPD exacerbation (HCC) [J44.1] Acute on chronic respiratory failure with hypoxia (HCC) [J96.21] Patient Active Problem List   Diagnosis Date Noted   COPD exacerbation (HCC) 03/31/2024   Chronic back pain 03/31/2024   Community acquired pneumonia 03/27/2024   Panic attacks 03/27/2024   Cigarette smoker 02/10/2024   Chronic respiratory failure with hypoxia (HCC) 02/10/2024   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   Pneumothorax on left 11/26/2023   Asthmatic bronchitis , chronic (HCC) 09/28/2023   Encounter for immunization 08/26/2023   Tobacco abuse 08/15/2023   Malnutrition of moderate degree 07/27/2023   CAP (community acquired  pneumonia) 07/26/2023   Acute and chronic respiratory failure with hypoxia (HCC) 07/26/2023   Cocaine abuse 07/26/2023   Tetrahydrocannabinol (THC) use disorder 07/26/2023   Hypokalemia 07/26/2023   Hyperglycemia 07/26/2023   Drug-seeking behavior 07/26/2023   Leukocytosis 07/26/2023   Opioid use disorder 07/17/2023   Pneumonia of right upper  lobe due to infectious organism 07/08/2023   Delirium 07/07/2023   Vomiting without nausea 07/07/2023   COPD with acute exacerbation (HCC) 07/06/2023   Normocytic anemia 07/06/2023   Pneumonia of right lower lobe due to infectious organism 07/06/2023   Nausea and vomiting 07/05/2023   Severe protein-calorie malnutrition (HCC) 06/29/2023   Polysubstance abuse (HCC) 06/26/2023   Acute metabolic encephalopathy 06/26/2023   Aspiration pneumonia (HCC) 06/25/2023   Primary insomnia 08/20/2021   Depression, recurrent (HCC) 08/20/2021   GAD (generalized anxiety disorder) 08/20/2021   Elevated blood pressure reading in office without diagnosis of hypertension 08/20/2021   Closed fracture of left proximal humerus 01/01/2020   Fracture of base of fifth metacarpal bone of right hand 10/06/2016   Benzodiazepine dependence, continuous (HCC) 01/09/2016   History of panic attacks 09/11/2014   Malaise 09/03/2014   Fatigue 09/03/2014   Body mass index (BMI) of 20.0-20.9 in adult 09/03/2014   PCP:  Zarwolo, Gloria, FNP Pharmacy:   [facility] [phone] - [place], [place] - [address] [place] [address] Phone: [phone] Fax: [phone]  [id number] - [place], [place] - [address] [place] [address] Phone: [phone] Fax: [phone]     Social Drivers of Health (SDOH) Social History: SDOH Screenings   Food Insecurity: No Food Insecurity (03/31/2024)  Recent Concern: Food Insecurity - Food Insecurity Present (03/17/2024)  Housing: Low Risk  (03/31/2024)  Transportation Needs: No Transportation Needs (03/31/2024)  Recent Concern: Transportation Needs - Unmet Transportation Needs (03/28/2024)  Utilities: Not At Risk (03/31/2024)  Depression (PHQ2-9): Low Risk  (03/27/2024)  Tobacco Use: High Risk (03/31/2024)   SDOH Interventions:     Readmission Risk Interventions    03/28/2024     8:07 PM 08/16/2023    2:16 PM 07/27/2023   10:34 AM  Readmission Risk Prevention Plan  Transportation Screening Complete Complete Complete  HRI or Home Care Consult   Complete  Social Work Consult for Recovery Care Planning/Counseling   Complete  Palliative Care Screening   Not Applicable  Medication Review Oceanographer) Complete Complete Complete  PCP or Specialist appointment within 3-5 days of discharge Complete    HRI or Home Care Consult Complete Complete   SW Recovery Care/Counseling Consult Complete Complete   Palliative Care Screening Not Applicable Not Applicable   Skilled Nursing Facility Complete Not Applicable

## 2024-04-01 NOTE — Progress Notes (Signed)
 1610  Patient toileting, then eating breakfast, unavailable for nebulizer at this time.

## 2024-04-01 NOTE — Progress Notes (Signed)
   04/01/24 1659  TOC Assessment  Once discharged, how will the patient get to their discharge location? Family/Friend - Photographer  Expected Discharge Plan Home w Home Health Services  Final next level of care Home w Home Health Services  Patient states their goals for this hospitalization and ongoing recovery are: Discharge home with help  CMS Medicare.gov Compare Post Acute Care list provided to: Patient  Living arrangements for the past 2 months Single Family Home  Lives with: Self  Share Information with NAME Gerldine Koch  Permission granted to share info w Relationship Friend  Permission granted to share info w Contact Information [phone]  Patient language and need for interpreter reviewed: Yes  Criminal Activity/Legal Involvement Pertinent to Current Situation/Hospitalization No - Comment as needed  Need for Family Participation in Patient Care Y  Care giver support system in place? Y  Appearance: Appears stated age  Attitude/Demeanor/Rapport Engaged  Affect (typically observed) Stable  Orientation:  Oriented to Self;Oriented to Place;Oriented to  Time;Oriented to Situation   Admitted COPD. TOC will continue to monitor patient advancement through interdisciplinary progressions rounds.
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
## 2024-04-01 NOTE — Plan of Care (Signed)
  Problem: Education: Goal: Knowledge of General Education information will improve Description: Including pain rating scale, medication(s)/side effects and non-pharmacologic comfort measures Outcome: Progressing   Problem: Clinical Measurements: Goal: Ability to maintain clinical measurements within normal limits will improve Outcome: Progressing   Problem: Clinical Measurements: Goal: Diagnostic test results will improve Outcome: Progressing   Problem: Clinical Measurements: Goal: Respiratory complications will improve Outcome: Progressing   Problem: Coping: Goal: Level of anxiety will decrease Outcome: Progressing

## 2024-04-01 NOTE — Plan of Care (Signed)

## 2024-04-01 NOTE — ED Notes (Signed)
Pt ambulated to the bathroom with minimal assist.
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
## 2024-04-02 DIAGNOSIS — J441 Chronic obstructive pulmonary disease with (acute) exacerbation: Secondary | ICD-10-CM | POA: Diagnosis not present

## 2024-04-02 MED ORDER — SODIUM CHLORIDE 3 % IN NEBU
4.0000 mL | INHALATION_SOLUTION | Freq: Every day | RESPIRATORY_TRACT | Status: AC
  Administered 2024-04-02 – 2024-04-04 (×3): 4 mL via RESPIRATORY_TRACT
  Filled 2024-04-02 (×3): qty 4

## 2024-04-02 NOTE — Plan of Care (Signed)
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
## 2024-04-02 NOTE — Evaluation (Signed)
 Physical Therapy Evaluation Patient Details Name: Casey [name] MRN: [id number] DOB: 1961/11/09 Today's Date: 04/02/2024  History of Present Illness  Casey [name] is a 63 y.o. female with medical history significant of COPD, depression, chronic back pain, anxiety and polysubstance abuse who presented with dyspnea.   Patient was discharged from the hospital yesterday, after a  2 day hospitalization for COPD exacerbation. She was treated with bronchodilator therapy and systemic corticosteroids.   At the time of her discharge she was feeling back to her baseline. Unfortunately at home she developed worsening dyspnea, rapidly progressive to the point where she was not able to do any minimal exertion without feeling symptoms. Her dyspnea has been associated with cough and wheezing, no chest pain, no fever or chills.   She has not been able to ambulate due to dyspnea.   She used her bronchodilator therapy and supplemental 02 per [place] (2L/min) with no improvement in her symptoms.      This is her #3 hospitalization for COPD this month, the first one was on 03/17/24. Looks like she has not been able to fully recover. Her home has no mold but has significant dust because she has not been able clean. She leaves alone and takes opiates analgesics for chronic pain.   Clinical Impression  Patient functioning near baseline for functional mobility and gait with good return for ambulating in room, hallway without loss of balance while on 2 LPM O2 with SpO2 at 94%, no loss of balance or need for an AD. Plan:  Patient discharged from physical therapy to care of nursing for ambulation daily as tolerated for length of stay.          If plan is discharge home, recommend the following: Help with stairs or ramp for entrance   Can travel by private vehicle        Equipment Recommendations None recommended by PT  Recommendations for Other Services       Functional Status Assessment Patient has had a recent decline  in their functional status and/or demonstrates limited ability to make significant improvements in function in a reasonable and predictable amount of time     Precautions / Restrictions Precautions Precautions: None Recall of Precautions/Restrictions: Intact Restrictions Weight Bearing Restrictions Per Provider Order: No      Mobility  Bed Mobility Overal bed mobility: Modified Independent                  Transfers Overall transfer level: Modified independent                      Ambulation/Gait Ambulation/Gait assistance: Modified independent (Device/Increase time) Gait Distance (Feet): 75 Feet Assistive device: None Gait Pattern/deviations: WFL(Within Functional Limits) Gait velocity: decreased     General Gait Details: grossly WFL with slightly labored movement for ambulating in room, hallway without loss of balance while on 2 LPM O2 with SpO2 at 94%  Stairs            Wheelchair Mobility     Tilt Bed    Modified Rankin (Stroke Patients Only)       Balance Overall balance assessment: No apparent balance deficits (not formally assessed)                                           Pertinent Vitals/Pain Pain Assessment Pain Assessment: No/denies pain  Home Living Family/patient expects to be discharged to:: Private residence Living Arrangements: Alone Available Help at Discharge: Family;Available PRN/intermittently;Friend(s) Type of Home: House Home Access: Stairs to enter   [employer] of Steps: 1   Home Layout: One level Home Equipment: Agricultural consultant (2 wheels)      Prior Function Prior Level of Function : Independent/Modified Independent             Mobility Comments: Reports as household ambulator without AD, and doesn't do much ambulating outside of home. Has RW but does not use ADLs Comments: Independent     Extremity/Trunk Assessment   Upper Extremity Assessment Upper Extremity  Assessment: Overall WFL for tasks assessed    Lower Extremity Assessment Lower Extremity Assessment: Overall WFL for tasks assessed    Cervical / Trunk Assessment Cervical / Trunk Assessment: Normal  Communication   Communication Communication: No apparent difficulties    Cognition Arousal: Alert Behavior During Therapy: WFL for tasks assessed/performed   PT - Cognitive impairments: No apparent impairments                         Following commands: Intact       Cueing Cueing Techniques: Verbal cues     General Comments      Exercises     Assessment/Plan    PT Assessment Patient does not need any further PT services  PT Problem List         PT Treatment Interventions      PT Goals (Current goals can be found in the Care Plan section)  Acute Rehab PT Goals Patient Stated Goal: return home with family to assist PT Goal Formulation: With patient Time For Goal Achievement: 04/02/24 Potential to Achieve Goals: Good    Frequency       Co-evaluation               AM-PAC PT "6 Clicks" Mobility  Outcome Measure Help needed turning from your back to your side while in a flat bed without using bedrails?: None Help needed moving from lying on your back to sitting on the side of a flat bed without using bedrails?: None Help needed moving to and from a bed to a chair (including a wheelchair)?: None Help needed standing up from a chair using your arms (e.g., wheelchair or bedside chair)?: None Help needed to walk in hospital room?: A Little Help needed climbing 3-5 steps with a railing? : A Little 6 Click Score: 22    End of Session Equipment Utilized During Treatment: Oxygen Activity Tolerance: Patient tolerated treatment well;Patient limited by fatigue Patient left: in bed;with call bell/phone within reach Nurse Communication: Mobility status PT Visit Diagnosis: Unsteadiness on feet (R26.81);Other abnormalities of gait and mobility  (R26.89);Muscle weakness (generalized) (M62.81)    Time: 1012-1029 PT Time Calculation (min) (ACUTE ONLY): 17 min   Charges:   PT Evaluation $PT Eval Low Complexity: 1 Low PT Treatments $Therapeutic Activity: 8-22 mins PT General Charges $$ ACUTE PT VISIT: 1 Visit         12:45 PM, 04/02/24 Walton Guppy, MPT Physical Therapist with [facility] [phone] office [phone] mobile phone

## 2024-04-03 DIAGNOSIS — J441 Chronic obstructive pulmonary disease with (acute) exacerbation: Secondary | ICD-10-CM | POA: Diagnosis not present

## 2024-04-03 MED ORDER — OXYCODONE-ACETAMINOPHEN 5-325 MG PO TABS
1.0000 | ORAL_TABLET | Freq: Four times a day (QID) | ORAL | Status: DC | PRN
  Administered 2024-04-03 (×2): 1 via ORAL
  Administered 2024-04-04 (×2): 2 via ORAL
  Filled 2024-04-03: qty 1
  Filled 2024-04-03: qty 2
  Filled 2024-04-03: qty 1
  Filled 2024-04-03 (×2): qty 2

## 2024-04-03 MED ORDER — ALPRAZOLAM 1 MG PO TABS
1.0000 mg | ORAL_TABLET | Freq: Four times a day (QID) | ORAL | Status: DC | PRN
  Administered 2024-04-03 – 2024-04-04 (×4): 1 mg via ORAL
  Filled 2024-04-03 (×5): qty 1

## 2024-04-03 MED ORDER — PREDNISONE 20 MG PO TABS
60.0000 mg | ORAL_TABLET | Freq: Every day | ORAL | Status: DC
Start: 2024-04-04 — End: 2024-04-04

## 2024-04-03 NOTE — Progress Notes (Signed)
 Mobility Specialist Progress Note:    04/03/24 1000  Mobility  Activity Ambulated with assistance in hallway  Level of Assistance Modified independent, requires aide device or extra time  Assistive Device None  Distance Ambulated (ft) 100 ft  Range of Motion/Exercises Active;All extremities  Activity Response Tolerated well  Mobility Referral Yes  Mobility visit 1 Mobility  Mobility Specialist Start Time (ACUTE ONLY) [id number]  Mobility Specialist Stop Time (ACUTE ONLY) 1000  Mobility Specialist Time Calculation (min) (ACUTE ONLY) 26 min   Pt received in bed, agreeable to mobility. ModI to stand and ambulate with no AD. Tolerated well, SpO2 93% on 3L during ambulation. Audible SOB at EOS. Returned pt supine, all needs met.  Glinda Lapping Mobility Specialist Please contact via Special educational needs teacher or  Rehab office at [phone]
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
## 2024-04-03 NOTE — Progress Notes (Signed)
 OT Cancellation Note  Patient Details Name: Casey [name] MRN: [id number] DOB: May 11, 1961   Cancelled Treatment:    Reason Eval/Treat Not Completed: OT screened, no needs identified, will sign off. Per physical therapy, pt is ambulating without AD well and is not in need of an OT evaluation. Pt will be removed from the OT list based on in formation in the chart and per discussion with physical therapy staff.   Zayde Stroupe OT, MOT  Thurnell Floss 04/03/2024, 12:06 PM

## 2024-04-04 ENCOUNTER — Telehealth: Payer: Self-pay

## 2024-04-04 DIAGNOSIS — J441 Chronic obstructive pulmonary disease with (acute) exacerbation: Secondary | ICD-10-CM | POA: Diagnosis not present

## 2024-04-04 MED ORDER — SODIUM CHLORIDE 3 % IN NEBU
INHALATION_SOLUTION | RESPIRATORY_TRACT | Status: AC
  Filled 2024-04-04: qty 4

## 2024-04-04 MED ORDER — PREDNISONE 20 MG PO TABS
40.0000 mg | ORAL_TABLET | Freq: Every day | ORAL | Status: DC
  Administered 2024-04-04: 40 mg via ORAL
  Filled 2024-04-04: qty 2

## 2024-04-04 MED ORDER — GUAIFENESIN ER 600 MG PO TB12: 1200.0000 mg | ORAL_TABLET | Freq: Two times a day (BID) | ORAL | 0 refills | Status: DC

## 2024-04-04 MED ORDER — BUDESONIDE 0.25 MG/2ML IN SUSP: 0.2500 mg | Freq: Two times a day (BID) | RESPIRATORY_TRACT | 0 refills | Status: DC
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
## 2024-04-04 NOTE — TOC Transition Note (Signed)
 Transition of Care [facility]) - Discharge Note   Patient Details  Name: Casey [name] MRN: [id number] Date of Birth: 25-Aug-1961  [facility] CM/SW Contact:  Orelia Binet, RN Phone Number: 04/04/2024, 10:17 AM   Clinical Narrative:   Patient discharging home. No new needs. TOC confirmed patient already has home oxygen set up at home. She is ready to go, ride is on the way.   Final next level of care: Home w Home Health Services Barriers to Discharge: No Barriers Identified   Patient Goals and CMS Choice Patient states their goals for this hospitalization and ongoing recovery are:: Discharge home with help CMS Medicare.gov Compare Post Acute Care list provided to:: Patient   Discharge Placement               Patient to be transferred to facility by: family   Patient and family notified of of transfer: 04/04/24  Discharge Plan and Services Additional resources added to the After Visit Summary for                    Social Drivers of Health (SDOH) Interventions SDOH Screenings   Food Insecurity: No Food Insecurity (03/31/2024)  Recent Concern: Food Insecurity - Food Insecurity Present (03/17/2024)  Housing: Low Risk  (03/31/2024)  Transportation Needs: No Transportation Needs (03/31/2024)  Recent Concern: Transportation Needs - Unmet Transportation Needs (03/28/2024)  Utilities: Not At Risk (03/31/2024)  Depression (PHQ2-9): Low Risk  (03/27/2024)  Tobacco Use: High Risk (03/31/2024)     Readmission Risk Interventions    03/28/2024    8:07 PM 08/16/2023    2:16 PM 07/27/2023   10:34 AM  Readmission Risk Prevention Plan  Transportation Screening Complete Complete Complete  HRI or Home Care Consult   Complete  Social Work Consult for Recovery Care Planning/Counseling   Complete  Palliative Care Screening   Not Applicable  Medication Review Oceanographer) Complete Complete Complete  PCP or Specialist appointment within 3-5 days of discharge Complete    HRI  or Home Care Consult Complete Complete   SW Recovery Care/Counseling Consult Complete Complete   Palliative Care Screening Not Applicable Not Applicable   Skilled Nursing Facility Complete Not Applicable

## 2024-04-04 NOTE — Progress Notes (Signed)
 Patient has discharge orders, discharge teaching given and no further questions at this time. Patient wheeled down to main entrance via wheelchair to vehicle accompanied by family friend.

## 2024-04-04 NOTE — Telephone Encounter (Signed)
 Copied from CRM [id number]. Topic: Appointments - Appointment Scheduling >> Apr 04, 2024 12:08 PM Emylou G wrote: Patient called.. says needs appt with Diamond Formica, MD Pulmonary.Aaron Aas

## 2024-04-04 NOTE — Telephone Encounter (Signed)
 Called to discuss scheduling follow up with Dr. Gaspar Karma answer and voice mail is full

## 2024-04-05 ENCOUNTER — Telehealth: Payer: Self-pay

## 2024-04-05 NOTE — Transitions of Care (Post Inpatient/ED Visit) (Signed)
 04/05/2024  Name: Casey [name] MRN: [id number] DOB: April 17, 1961  Today's TOC FU Call Status: Today's TOC FU Call Status:: Successful TOC FU Call Completed TOC FU Call Complete Date: 04/05/24 Patient's Name and Date of Birth confirmed.  Transition Care Management Follow-up Telephone Call Date of Discharge: 04/04/24 [facility] Type of Discharge: Inpatient Admission Primary Inpatient Discharge Diagnosis:: COPD exacerbation How have you been since you were released from the hospital?: Better Any questions or concerns?: No  Items Reviewed: Did you receive and understand the discharge instructions provided?: Yes Medications obtained,verified, and reconciled?: Yes (Medications Reviewed) Any new allergies since your discharge?: Yes (Patient reports and record reflects Methylprednisolone  caused Anxiety) Dietary orders reviewed?: Yes Type of Diet Ordered:: low sodium heart healthy Do you have support at home?: Yes People in Home [RPT]: alone Name of Support/Comfort Primary Source: patient states son is incarcerated and states her sister helps when she can and has a neighbor who helps and is picking up her medications today  Medications Reviewed Today: Medications Reviewed Today     Reviewed by Sharmaine Dearth, RN (Registered Nurse) on 04/05/24 at 1243  Med List Status: <None>   Medication Order Taking? Sig Documenting Provider Last Dose Status Informant  acetaminophen  (TYLENOL ) 325 MG tablet [id number] Yes Take 2 tablets (650 mg total) by mouth every 6 (six) hours as needed for mild pain (or Fever >/= 101). Colin Dawley, MD Taking Active Self  albuterol  (VENTOLIN  HFA) 108 (90 Base) MCG/ACT inhaler [id number] Yes Inhale 2 puffs into the lungs every 4 (four) hours as needed for wheezing or shortness of breath. [name], MD Taking Active Self  ALPRAZolam  (XANAX ) 1 MG tablet [id number] Yes Take 1 tablet (1 mg total) by mouth 3 (three) times daily as  needed for anxiety.  Patient taking differently: Take 1 mg by mouth in the morning, at noon, and at bedtime.   [name], MD Taking Active Self  budesonide  (PULMICORT ) 0.25 MG/2ML nebulizer solution [id number] No Take 2 mLs (0.25 mg total) by nebulization 2 (two) times daily.  Patient not taking: Reported on 04/05/2024   Wynetta Heckle, MD Not Taking Active            Med Note [facility], [place] A   Wed Apr 05, 2024 12:36 PM) Patient states a friend is picking up from pharmacy today 04/05/24  citalopram  (CELEXA ) 20 MG tablet [id number] Yes Take 1 tablet (20 mg total) by mouth daily. Demaris Fillers, MD Taking Active Self  feeding supplement (ENSURE ENLIVE / ENSURE PLUS) LIQD [id number] Yes Take 237 mLs by mouth 3 (three) times daily between meals. Colin Dawley, MD Taking Active Self           Med Note Author Board, ANGELICA G   Tue Mar 28, 2024  3:56 PM)    guaiFENesin  (MUCINEX ) 600 MG 12 hr tablet [id number] No Take 2 tablets (1,200 mg total) by mouth 2 (two) times daily.  Patient not taking: Reported on 04/05/2024   Wynetta Heckle, MD Not Taking Active            Med Note [facility], [place] A   Wed Apr 05, 2024 12:39 PM) Patient states a friend is picking up today 04/05/24 from pharmacy  ipratropium-albuterol  (DUONEB) 0.5-2.5 (3) MG/3ML SOLN [id number] Yes Take 3 mLs by nebulization every 6 (six) hours as needed.  Patient taking differently: Take 3 mLs by nebulization every 6 (six) hours as needed (wheezing).   Diamond Formica, MD  Taking Active Self  methocarbamol  (ROBAXIN ) 500 MG tablet [id number] Yes Take 500 mg by mouth every 8 (eight) hours as needed for muscle spasms. [provider] Taking Active Self  oxyCODONE -acetaminophen  (PERCOCET/ROXICET) 5-325 MG tablet [id number] Yes Take 1 tablet by mouth every 6 (six) hours as needed for up to 5 days for severe pain (pain score 7-10).  Patient taking differently: Take 1 tablet by mouth in the morning, at noon, and at bedtime.   [name],  MD Taking Active Self  OXYGEN [id number] Yes Inhale 3 L into the lungs continuous. [provider] Taking Active   predniSONE  (DELTASONE ) 20 MG tablet [id number] Yes Take 3 PO QAM x5days, 2 PO QAM x5days, 1 PO QAM x5days  Patient taking differently: Take 20-60 mg by mouth See admin instructions. Take 3 DAILY x5days, 2 DAILY x5days, 1 DAILY x5days STARTING ON 03/30/2024   [name], MD Taking Active Self            Home Care and Equipment/Supplies: Were Home Health Services Ordered?: NA Any new equipment or medical supplies ordered?: No  Functional Questionnaire: Do you need assistance with bathing/showering or dressing?: No (patient states she washes up daily and showers once a week) Do you need assistance with meal preparation?: No (patient states people bring her food and she prepares - states cousin brought her food yesterday) Do you need assistance with eating?: No Do you have difficulty maintaining continence: Yes (patient reports occasional bladdr incontinence) Do you need assistance with getting out of bed/getting out of a chair/moving?: No Do you have difficulty managing or taking your medications?: No  Follow up appointments reviewed: PCP Follow-up appointment confirmed?: Yes Date of PCP follow-up appointment?: 04/17/24 Follow-up Provider: Zarwolo, Gloria Specialist [facility] Follow-up appointment confirmed?: Yes Date of Specialist follow-up appointment?: 04/06/24 Follow-Up Specialty Provider:: Dr Waymond Hailey Do you need transportation to your follow-up appointment?: No (patient states she takes a bus to MD appointments) Do you understand care options if your condition(s) worsen?: Yes-patient verbalized understanding  SDOH Interventions Today    Flowsheet Row Most Recent Value  SDOH Interventions   Food Insecurity Interventions Other (Comment)  [patient is with [employer] - $23/month food stamps - $1200/month SSI]  Housing Interventions Intervention Not Indicated   [patient states house is paid for]  Transportation Interventions Intervention Not Indicated  [patient states she utilizes Medicaid bus to get to appointments]  Utilities Interventions Intervention Not Indicated      TOC RN called  [employer] at [phone] to advise of patient's recent hospitalization and initial TOC call -  spoke with Woodfin Hays who was advised patient was given their number and requested they connect with patient - [facility] RN was provided  Call reference # [id number]  Tonia Frankel RN, CCM [place]  [facility]-Population Health RN Care Manager [phone]

## 2024-04-06 ENCOUNTER — Ambulatory Visit (INDEPENDENT_AMBULATORY_CARE_PROVIDER_SITE_OTHER): Payer: MEDICAID | Admitting: Internal Medicine

## 2024-04-06 ENCOUNTER — Encounter: Payer: Self-pay | Admitting: Internal Medicine

## 2024-04-06 ENCOUNTER — Telehealth: Payer: Self-pay | Admitting: Family Medicine

## 2024-04-06 VITALS — BP 122/74 | HR 91 | Ht 65.0 in | Wt 122.0 lb

## 2024-04-06 DIAGNOSIS — F17210 Nicotine dependence, cigarettes, uncomplicated: Secondary | ICD-10-CM

## 2024-04-06 DIAGNOSIS — J9611 Chronic respiratory failure with hypoxia: Secondary | ICD-10-CM | POA: Diagnosis not present

## 2024-04-06 DIAGNOSIS — J4489 Other specified chronic obstructive pulmonary disease: Secondary | ICD-10-CM

## 2024-04-06 MED ORDER — IPRATROPIUM-ALBUTEROL 0.5-2.5 (3) MG/3ML IN SOLN: RESPIRATORY_TRACT | 11 refills | Status: DC

## 2024-04-06 MED ORDER — NICOTINE 7 MG/24HR TD PT24: 7.0000 mg | MEDICATED_PATCH | Freq: Every day | TRANSDERMAL | 1 refills | Status: DC

## 2024-04-06 NOTE — Telephone Encounter (Signed)
PCS  Noted  Copied Sleeved  Original in PCP box Copy front desk folder
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
## 2024-04-06 NOTE — Telephone Encounter (Signed)
 Faxed to [facility] at [phone] and [phone] with confirmation

## 2024-04-06 NOTE — Patient Instructions (Signed)
 Duoneb 4 x daily   On the first and the third dose add budesonide  0.25 mg to the duoneb    Prednisone  20 mg x 1  each am with breakfast until better then one half x 3 days and stop  Try nictoine patch x 7 mg  one daily   Please schedule a follow up office visit in 6 weeks, call sooner if needed with all medications /inhalers/ solutions in hand so we can verify exactly what you are taking. This includes all medications from all doctors and over the counters

## 2024-04-06 NOTE — Telephone Encounter (Signed)
 Given to [place] to fax. She will update chart once complete

## 2024-04-06 NOTE — Assessment & Plan Note (Signed)
 4-5 min discussion re active cigarette smoking in addition to office E&M  Ask about tobacco use:   ongoing  Advise quitting   I took an extended  opportunity with this patient to outline the consequences of continued cigarette use  in airway disorders based on all the data we have from the multiple national lung health studies (perfomed over decades at millions of dollars in cost)  indicating that smoking cessation, not choice of inhalers or pulmonary physicians, is the most important aspect of her care.   Assess willingness:  Not committed at this point Assist in quit attempt:  Nicotine  7 mg patches if not smoking at all  Arrange follow up:   Follow up per Primary Care planned

## 2024-04-07 NOTE — Assessment & Plan Note (Signed)
 Active smoking  - 09/28/2023  After extensive coaching inhaler device,  effectiveness =    60% with hfa and smi  - 09/28/2023 start symbicort  160/spiriva  2.5   samples and f/u 6 weeks  - 10/15/2023   > add neb alb q 4h prn  - 04/06/2024  After extensive coaching inhaler device,  effectiveness =  25% at best so rec just rx duoneb qid and budesonide  0.25 mg bid per neb   With limited funds/ insight I believe the simple rx above should work the best for her and can still use the saba hfa prn even though tenchnique is iffy  Re SABA :  I spent extra time with pt today reviewing appropriate use of albuterol  for prn use on exertion with the following points: 1) saba is for relief of sob that does not improve by walking a slower pace or resting but rather if the pt does not improve after trying this first. 2) If the pt is convinced, as many are, that saba helps recover from activity faster then it's easy to tell if this is the case by re-challenging : ie stop, take the inhaler, then p 5 minutes try the exact same activity (intensity of workload) that just caused the symptoms and see if they are substantially diminished or not after saba 3) if there is an activity that reproducibly causes the symptoms, try the saba 15 min before the activity on alternate days   If in fact the saba really does help, then fine to continue to use it prn but advised may need to look closer at the maintenance regimen being used to achieve better control of airways disease with exertion.
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
## 2024-04-17 ENCOUNTER — Ambulatory Visit (INDEPENDENT_AMBULATORY_CARE_PROVIDER_SITE_OTHER): Payer: MEDICAID | Admitting: Family Medicine

## 2024-04-17 ENCOUNTER — Encounter: Payer: Self-pay | Admitting: Family Medicine

## 2024-04-17 ENCOUNTER — Telehealth: Payer: Self-pay | Admitting: Family Medicine

## 2024-04-17 VITALS — BP 152/79 | HR 99 | Ht 65.0 in | Wt 118.0 lb

## 2024-04-17 DIAGNOSIS — J441 Chronic obstructive pulmonary disease with (acute) exacerbation: Secondary | ICD-10-CM | POA: Diagnosis not present

## 2024-04-17 DIAGNOSIS — Z0283 Encounter for blood-alcohol and blood-drug test: Secondary | ICD-10-CM | POA: Diagnosis not present

## 2024-04-17 DIAGNOSIS — N3946 Mixed incontinence: Secondary | ICD-10-CM

## 2024-04-17 NOTE — Telephone Encounter (Signed)
 Casey [name] arrived for her scheduled appointment today. During check-in, her oxygen saturation was measured at 84%. She was receiving 2 liters of oxygen from her personal tank but reported that her tank was nearly empty and requested to be transitioned to our supply. A new oxygen tank set to 2 liters was provided to her during Casey visit. After Casey appointment, she inquired about taking our oxygen tank home, as her own was depleted and she was unable to reach home without supplemental oxygen. Casey [name] requested whether it would be permissible for her to take our tank, provided that it is returned to us . Casey decision was left to Casey [name]'s clinical judgment. She declined to allow Casey [name] to drive without sufficient oxygen support. Casey [name] was escorted to [facility] lab to provide a urine sample, and upon completion, she left [facility] facility with our oxygen tank, placing her personal tank by Casey restroom. Casey tank has been labeled with her name and date of birth and stored in Casey oxygen storage room. It will be returned to her once Casey office tank has been returned.

## 2024-04-17 NOTE — Assessment & Plan Note (Signed)
 Labs and Imaging Review Recent labs and imaging studies were reviewed. The patient presented to the clinic today with oxygen saturation of 84%, along with shortness of breath, fatigue, and difficulty breathing. On examination, it was noted that her oxygen tank was completely empty. The tank was replaced in the clinic, and she was placed on 3 liters of oxygen, after which she reported feeling better. The patient is currently taking Prednisone  and has one day remaining in her course. She is encouraged to complete the therapy as prescribed. The patient is advised to follow up in the Emergency Department if she experiences increased shortness of breath, chest tightness, fatigue, or if her oxygen saturation drops below 90% while on oxygen therapy.
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
## 2024-04-17 NOTE — Patient Instructions (Addendum)
 I appreciate the opportunity to provide care to you today!    Follow up:  4 months  Please complete the full course of Prednisone  as prescribed. I will refill your Xanax  after reviewing your urine drug screening results. Please report to the Emergency Department if you experience increased shortness of breath, chest tightness, fatigue, or if your oxygen saturation drops below 90% while on oxygen therapy.  Please follow up if your symptoms worsen or fail to improve.    Please continue to a heart-healthy diet and increase your physical activities. Try to exercise for at least five days a week.    It was a pleasure to see you and I look forward to continuing to work together on your health and well-being. Please do not hesitate to call the office if you need care or have questions about your care.  In case of emergency, please visit the Emergency Department for urgent care, or contact our clinic at [phone] to schedule an appointment. We're here to help you!   Have a wonderful day and week. With Gratitude, Ahley Bulls MSN, FNP-BC

## 2024-04-19 ENCOUNTER — Telehealth: Payer: Self-pay

## 2024-04-19 NOTE — Telephone Encounter (Signed)
 Copied from CRM [id number]. Topic: Clinical - Lab/Test Results >> Apr 19, 2024  2:55 PM Oddis Bench wrote: Reason for CRM: Patient callin about labs would like Art Bigness to call her to go over.

## 2024-04-20 ENCOUNTER — Telehealth: Payer: Self-pay | Admitting: Family Medicine

## 2024-04-20 ENCOUNTER — Encounter (HOSPITAL_COMMUNITY): Payer: Self-pay

## 2024-04-20 ENCOUNTER — Other Ambulatory Visit: Payer: Self-pay

## 2024-04-20 ENCOUNTER — Telehealth: Payer: Self-pay

## 2024-04-20 ENCOUNTER — Emergency Department (HOSPITAL_COMMUNITY): Payer: MEDICAID

## 2024-04-20 ENCOUNTER — Inpatient Hospital Stay (HOSPITAL_COMMUNITY)
Admission: EM | Admit: 2024-04-20 | Discharge: 2024-04-24 | DRG: 189 | Disposition: A | Discharge status: Home / Self Care | Payer: MEDICAID | Attending: Internal Medicine | Admitting: Internal Medicine

## 2024-04-20 DIAGNOSIS — Z823 Family history of stroke: Secondary | ICD-10-CM

## 2024-04-20 DIAGNOSIS — Z96612 Presence of left artificial shoulder joint: Secondary | ICD-10-CM | POA: Diagnosis present

## 2024-04-20 DIAGNOSIS — F32A Depression, unspecified: Secondary | ICD-10-CM | POA: Diagnosis present

## 2024-04-20 DIAGNOSIS — R0602 Shortness of breath: Secondary | ICD-10-CM | POA: Diagnosis not present

## 2024-04-20 DIAGNOSIS — R062 Wheezing: Secondary | ICD-10-CM | POA: Diagnosis not present

## 2024-04-20 DIAGNOSIS — Z7951 Long term (current) use of inhaled steroids: Secondary | ICD-10-CM

## 2024-04-20 DIAGNOSIS — D72829 Elevated white blood cell count, unspecified: Secondary | ICD-10-CM | POA: Diagnosis present

## 2024-04-20 DIAGNOSIS — Z79899 Other long term (current) drug therapy: Secondary | ICD-10-CM

## 2024-04-20 DIAGNOSIS — Z8349 Family history of other endocrine, nutritional and metabolic diseases: Secondary | ICD-10-CM | POA: Diagnosis not present

## 2024-04-20 DIAGNOSIS — F419 Anxiety disorder, unspecified: Secondary | ICD-10-CM | POA: Diagnosis present

## 2024-04-20 DIAGNOSIS — M549 Dorsalgia, unspecified: Secondary | ICD-10-CM | POA: Diagnosis not present

## 2024-04-20 DIAGNOSIS — Z818 Family history of other mental and behavioral disorders: Secondary | ICD-10-CM

## 2024-04-20 DIAGNOSIS — Z9981 Dependence on supplemental oxygen: Secondary | ICD-10-CM

## 2024-04-20 DIAGNOSIS — D649 Anemia, unspecified: Secondary | ICD-10-CM | POA: Diagnosis present

## 2024-04-20 DIAGNOSIS — Z8249 Family history of ischemic heart disease and other diseases of the circulatory system: Secondary | ICD-10-CM

## 2024-04-20 DIAGNOSIS — Z720 Tobacco use: Secondary | ICD-10-CM | POA: Diagnosis present

## 2024-04-20 DIAGNOSIS — R000 Tachycardia, unspecified: Secondary | ICD-10-CM | POA: Diagnosis not present

## 2024-04-20 DIAGNOSIS — Z833 Family history of diabetes mellitus: Secondary | ICD-10-CM

## 2024-04-20 DIAGNOSIS — F17210 Nicotine dependence, cigarettes, uncomplicated: Secondary | ICD-10-CM | POA: Diagnosis present

## 2024-04-20 DIAGNOSIS — E876 Hypokalemia: Secondary | ICD-10-CM | POA: Diagnosis not present

## 2024-04-20 DIAGNOSIS — J9621 Acute and chronic respiratory failure with hypoxia: Principal | ICD-10-CM | POA: Diagnosis present

## 2024-04-20 DIAGNOSIS — G8929 Other chronic pain: Secondary | ICD-10-CM | POA: Diagnosis present

## 2024-04-20 DIAGNOSIS — F410 Panic disorder [episodic paroxysmal anxiety] without agoraphobia: Secondary | ICD-10-CM

## 2024-04-20 DIAGNOSIS — R739 Hyperglycemia, unspecified: Secondary | ICD-10-CM | POA: Diagnosis present

## 2024-04-20 DIAGNOSIS — R0689 Other abnormalities of breathing: Secondary | ICD-10-CM | POA: Diagnosis not present

## 2024-04-20 DIAGNOSIS — J441 Chronic obstructive pulmonary disease with (acute) exacerbation: Secondary | ICD-10-CM | POA: Diagnosis not present

## 2024-04-20 DIAGNOSIS — J9601 Acute respiratory failure with hypoxia: Secondary | ICD-10-CM

## 2024-04-20 DIAGNOSIS — Z820 Family history of epilepsy and other diseases of the nervous system: Secondary | ICD-10-CM

## 2024-04-20 LAB — BLOOD GAS, VENOUS
Acid-Base Excess: 0.5 mmol/L (ref 0.0–2.0)
Bicarbonate: 27.3 mmol/L (ref 20.0–28.0)
Drawn by: 61519
O2 Saturation: 89.5 %
Patient temperature: 36.9
pCO2, Ven: 53 mmHg (ref 44–60)
pH, Ven: 7.32 (ref 7.25–7.43)
pO2, Ven: 59 mmHg — ABNORMAL HIGH (ref 32–45)

## 2024-04-20 LAB — BASIC METABOLIC PANEL WITH GFR
Anion gap: 8 (ref 5–15)
BUN: 13 mg/dL (ref 8–23)
CO2: 25 mmol/L (ref 22–32)
Calcium: 7.7 mg/dL — ABNORMAL LOW (ref 8.9–10.3)
Chloride: 105 mmol/L (ref 98–111)
Creatinine, Ser: 0.55 mg/dL (ref 0.44–1.00)
GFR, Estimated: >60 mL/min (ref >60)
Glucose, Bld: 270 mg/dL — ABNORMAL HIGH (ref 70–99)
Potassium: 3.1 mmol/L — ABNORMAL LOW (ref 3.5–5.1)
Sodium: 138 mmol/L (ref 135–145)

## 2024-04-20 LAB — CBC WITH DIFFERENTIAL/PLATELET
Abs Immature Granulocytes: 0.06 10*3/uL (ref 0.00–0.07)
Basophils Absolute: 0.1 10*3/uL (ref 0.0–0.1)
Basophils Relative: 0 %
Eosinophils Absolute: 0.7 10*3/uL — ABNORMAL HIGH (ref 0.0–0.5)
Eosinophils Relative: 4 %
HCT: 33.9 % — ABNORMAL LOW (ref 36.0–46.0)
Hemoglobin: 10.8 g/dL — ABNORMAL LOW (ref 12.0–15.0)
Immature Granulocytes: 0 %
Lymphocytes Relative: 28 %
Lymphs Abs: 4.7 10*3/uL — ABNORMAL HIGH (ref 0.7–4.0)
MCH: 29.3 pg (ref 26.0–34.0)
MCHC: 31.9 g/dL (ref 30.0–36.0)
MCV: 91.9 fL (ref 80.0–100.0)
Monocytes Absolute: 1.3 10*3/uL — ABNORMAL HIGH (ref 0.1–1.0)
Monocytes Relative: 8 %
Neutro Abs: 10 10*3/uL — ABNORMAL HIGH (ref 1.7–7.7)
Neutrophils Relative %: 60 %
Platelets: 337 10*3/uL (ref 150–400)
RBC: 3.69 MIL/uL — ABNORMAL LOW (ref 3.87–5.11)
RDW: 13.6 % (ref 11.5–15.5)
WBC: 16.9 10*3/uL — ABNORMAL HIGH (ref 4.0–10.5)
nRBC: 0 % (ref 0.0–0.2)

## 2024-04-20 LAB — GLUCOSE, CAPILLARY
Glucose-Capillary: 237 mg/dL — ABNORMAL HIGH (ref 70–99)
Glucose-Capillary: 136 mg/dL — ABNORMAL HIGH (ref 70–99)
Glucose-Capillary: 132 mg/dL — ABNORMAL HIGH (ref 70–99)
Glucose-Capillary: 118 mg/dL — ABNORMAL HIGH (ref 70–99)

## 2024-04-20 LAB — PHOSPHORUS: Phosphorus: 2.6 mg/dL (ref 2.5–4.6)

## 2024-04-20 LAB — MAGNESIUM: Magnesium: 3.6 mg/dL — ABNORMAL HIGH (ref 1.7–2.4)

## 2024-04-20 LAB — MRSA NEXT GEN BY PCR, NASAL: MRSA by PCR Next Gen: NOT DETECTED

## 2024-04-20 LAB — CBG MONITORING, ED: Glucose-Capillary: 234 mg/dL — ABNORMAL HIGH (ref 70–99)

## 2024-04-20 MED ORDER — ENSURE ENLIVE PO LIQD
237.0000 mL | Freq: Three times a day (TID) | ORAL | Status: DC
  Filled 2024-04-20 (×4): qty 237

## 2024-04-20 MED ORDER — ALPRAZOLAM 1 MG PO TABS
1.0000 mg | ORAL_TABLET | Freq: Three times a day (TID) | ORAL | Status: DC | PRN
  Administered 2024-04-20 – 2024-04-24 (×13): 1 mg via ORAL
  Filled 2024-04-20 (×3): qty 2
  Filled 2024-04-20: qty 1
  Filled 2024-04-20 (×2): qty 2
  Filled 2024-04-20 (×2): qty 1
  Filled 2024-04-20: qty 2
  Filled 2024-04-20: qty 1
  Filled 2024-04-20: qty 2
  Filled 2024-04-20 (×2): qty 1

## 2024-04-20 MED ORDER — ENSURE PLUS HIGH PROTEIN PO LIQD
237.0000 mL | Freq: Three times a day (TID) | ORAL | Status: DC
  Administered 2024-04-20 – 2024-04-24 (×6): 237 mL via ORAL

## 2024-04-20 MED ORDER — NICOTINE 7 MG/24HR TD PT24
7.0000 mg | MEDICATED_PATCH | Freq: Every day | TRANSDERMAL | Status: DC
  Administered 2024-04-20 – 2024-04-24 (×5): 7 mg via TRANSDERMAL
  Filled 2024-04-20 (×5): qty 1

## 2024-04-20 MED ORDER — ACETAMINOPHEN 325 MG PO TABS
650.0000 mg | ORAL_TABLET | Freq: Four times a day (QID) | ORAL | Status: DC | PRN
  Administered 2024-04-22: 650 mg via ORAL
  Filled 2024-04-20: qty 2

## 2024-04-20 MED ORDER — ONDANSETRON HCL 4 MG PO TABS
4.0000 mg | ORAL_TABLET | Freq: Four times a day (QID) | ORAL | Status: AC | PRN
Start: 2024-04-20 — End: ?

## 2024-04-20 MED ORDER — ENOXAPARIN SODIUM 40 MG/0.4ML IJ SOSY
40.0000 mg | PREFILLED_SYRINGE | INTRAMUSCULAR | Status: DC
  Administered 2024-04-20 – 2024-04-24 (×5): 40 mg via SUBCUTANEOUS
  Filled 2024-04-20 (×5): qty 0.4

## 2024-04-20 MED ORDER — LEVALBUTEROL HCL 0.63 MG/3ML IN NEBU
0.6300 mg | INHALATION_SOLUTION | Freq: Four times a day (QID) | RESPIRATORY_TRACT | Status: DC
  Administered 2024-04-20 – 2024-04-22 (×11): 0.63 mg via RESPIRATORY_TRACT
  Filled 2024-04-20 (×12): qty 3

## 2024-04-20 MED ORDER — OXYCODONE-ACETAMINOPHEN 5-325 MG PO TABS
1.0000 | ORAL_TABLET | Freq: Four times a day (QID) | ORAL | Status: DC | PRN
  Administered 2024-04-20 – 2024-04-22 (×6): 1 via ORAL
  Administered 2024-04-22: 2 via ORAL
  Administered 2024-04-22: 1 via ORAL
  Administered 2024-04-23 – 2024-04-24 (×6): 2 via ORAL
  Filled 2024-04-20 (×3): qty 2
  Filled 2024-04-20 (×2): qty 1
  Filled 2024-04-20: qty 2
  Filled 2024-04-20: qty 1
  Filled 2024-04-20 (×2): qty 2
  Filled 2024-04-20: qty 1
  Filled 2024-04-20: qty 2
  Filled 2024-04-20 (×2): qty 1
  Filled 2024-04-20 (×2): qty 2

## 2024-04-20 MED ORDER — BUDESONIDE 0.25 MG/2ML IN SUSP
0.2500 mg | Freq: Two times a day (BID) | RESPIRATORY_TRACT | Status: DC
  Administered 2024-04-20 – 2024-04-24 (×8): 0.25 mg via RESPIRATORY_TRACT
  Filled 2024-04-20 (×9): qty 2

## 2024-04-20 MED ORDER — ONDANSETRON HCL 4 MG/2ML IJ SOLN: 4.0000 mg | Freq: Four times a day (QID) | INTRAMUSCULAR | Status: DC | PRN

## 2024-04-20 MED ORDER — PANTOPRAZOLE SODIUM 40 MG PO TBEC
40.0000 mg | DELAYED_RELEASE_TABLET | Freq: Every day | ORAL | Status: DC
  Administered 2024-04-20 – 2024-04-24 (×5): 40 mg via ORAL
  Filled 2024-04-20 (×5): qty 1

## 2024-04-20 MED ORDER — POTASSIUM CHLORIDE 10 MEQ/100ML IV SOLN
10.0000 meq | INTRAVENOUS | Status: AC
  Administered 2024-04-20 (×3): 10 meq via INTRAVENOUS
  Filled 2024-04-20 (×3): qty 100

## 2024-04-20 MED ORDER — CITALOPRAM HYDROBROMIDE 20 MG PO TABS
20.0000 mg | ORAL_TABLET | Freq: Every day | ORAL | Status: DC
  Administered 2024-04-20 – 2024-04-24 (×5): 20 mg via ORAL
  Filled 2024-04-20 (×5): qty 1

## 2024-04-20 MED ORDER — AZITHROMYCIN 250 MG PO TABS
500.0000 mg | ORAL_TABLET | Freq: Every day | ORAL | Status: AC
  Administered 2024-04-20: 500 mg via ORAL
  Filled 2024-04-20: qty 2

## 2024-04-20 MED ORDER — IPRATROPIUM BROMIDE 0.02 % IN SOLN: 0.5000 mg | RESPIRATORY_TRACT | Status: DC | PRN

## 2024-04-20 MED ORDER — DEXAMETHASONE SODIUM PHOSPHATE 10 MG/ML IJ SOLN
10.0000 mg | INTRAMUSCULAR | Status: DC
  Administered 2024-04-21 – 2024-04-24 (×4): 10 mg via INTRAVENOUS
  Filled 2024-04-20 (×4): qty 1

## 2024-04-20 MED ORDER — AZITHROMYCIN 250 MG PO TABS
250.0000 mg | ORAL_TABLET | Freq: Every day | ORAL | Status: AC
  Administered 2024-04-21 – 2024-04-24 (×4): 250 mg via ORAL
  Filled 2024-04-20 (×4): qty 1

## 2024-04-20 MED ORDER — LEVALBUTEROL HCL 0.63 MG/3ML IN NEBU
INHALATION_SOLUTION | RESPIRATORY_TRACT | Status: AC
  Filled 2024-04-20: qty 3

## 2024-04-20 MED ORDER — METHYLPREDNISOLONE SODIUM SUCC 40 MG IJ SOLR
40.0000 mg | Freq: Two times a day (BID) | INTRAMUSCULAR | Status: DC
  Filled 2024-04-20: qty 1

## 2024-04-20 MED ORDER — ACETAMINOPHEN 650 MG RE SUPP: 650.0000 mg | Freq: Four times a day (QID) | RECTAL | Status: DC | PRN

## 2024-04-20 MED ORDER — CHLORHEXIDINE GLUCONATE CLOTH 2 % EX PADS
6.0000 | MEDICATED_PAD | Freq: Every day | CUTANEOUS | Status: DC
  Administered 2024-04-21 – 2024-04-22 (×2): 6 via TOPICAL

## 2024-04-20 MED ORDER — LORAZEPAM 2 MG/ML IJ SOLN
0.5000 mg | Freq: Once | INTRAMUSCULAR | Status: AC
  Administered 2024-04-20: 0.5 mg via INTRAVENOUS
  Filled 2024-04-20: qty 1

## 2024-04-20 MED ORDER — DEXAMETHASONE SODIUM PHOSPHATE 10 MG/ML IJ SOLN
10.0000 mg | Freq: Once | INTRAMUSCULAR | Status: AC
  Administered 2024-04-20: 10 mg via INTRAVENOUS
  Filled 2024-04-20: qty 1

## 2024-04-20 MED ORDER — DM-GUAIFENESIN ER 30-600 MG PO TB12
1.0000 | ORAL_TABLET | Freq: Two times a day (BID) | ORAL | Status: DC
  Administered 2024-04-20 – 2024-04-24 (×9): 1 via ORAL
  Filled 2024-04-20 (×9): qty 1

## 2024-04-20 MED ORDER — LEVALBUTEROL HCL 1.25 MG/0.5ML IN NEBU
INHALATION_SOLUTION | RESPIRATORY_TRACT | Status: AC
  Administered 2024-04-20: 5 mg
  Filled 2024-04-20: qty 2

## 2024-04-20 MED ORDER — INSULIN ASPART 100 UNIT/ML IJ SOLN
0.0000 [IU] | Freq: Three times a day (TID) | INTRAMUSCULAR | Status: DC
  Administered 2024-04-20 (×2): 5 [IU] via SUBCUTANEOUS
  Administered 2024-04-20 – 2024-04-21 (×2): 2 [IU] via SUBCUTANEOUS
  Administered 2024-04-21: 3 [IU] via SUBCUTANEOUS
  Administered 2024-04-21: 2 [IU] via SUBCUTANEOUS
  Administered 2024-04-22: 3 [IU] via SUBCUTANEOUS
  Administered 2024-04-22: 2 [IU] via SUBCUTANEOUS
  Administered 2024-04-23: 3 [IU] via SUBCUTANEOUS
  Administered 2024-04-23: 2 [IU] via SUBCUTANEOUS
  Filled 2024-04-20: qty 1

## 2024-04-20 MED ORDER — IPRATROPIUM BROMIDE 0.02 % IN SOLN
RESPIRATORY_TRACT | Status: AC
  Administered 2024-04-20: 1 mg
  Filled 2024-04-20: qty 5

## 2024-04-20 NOTE — ED Notes (Signed)
 CBG on arrival was 234mg /dL
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
## 2024-04-20 NOTE — Progress Notes (Signed)
 Patient with significant COPD history and is known to have high anxiety and panic attacks. Upon exiting room, patient kept asking for her medicine that she takes at home. RN had just given a dose of ativan . Patient encouraged to take slow deep breaths to improve her resp status and then we could discuss other medications. VBG pending in lab. 5mg  Xopenex /1mg  Atrovent  CAT given through [facility] inline with BIPAP.

## 2024-04-20 NOTE — Progress Notes (Signed)
 PROGRESS NOTE    Casey [name]  EAV:[id number] DOB: 04-24-1961 DOA: 04/20/2024 PCP: Zarwolo, Gloria, FNP   Brief Narrative:    Casey [name] is a 63 y.o. female with medical history significant of COPD, depression, anxiety, chronic back pain, polysubstance abuse who presents to the emergency department due to shortness of breath.  She has been readmitted with acute on chronic hypoxemic respiratory failure secondary to COPD exacerbation.  She is also noted to have significant symptoms of anxiety.  Assessment & Plan:   Principal Problem:   Acute exacerbation of chronic obstructive pulmonary disease (COPD) (HCC) Active Problems:   Tobacco abuse   Acute and chronic respiratory failure with hypoxia (HCC)   Hypokalemia   Hyperglycemia   Leukocytosis   Anxiety  Assessment and Plan:   Acute exacerbation of COPD Acute on chronic respiratory failure with hypoxia requiring NIPPV Continue Xopenex , Atrovent , Mucinex , Solu-Medrol , azithromycin . Continue Protonix  to prevent steroid-induced ulcer Continue incentive spirometry and flutter valve Continue BiPAP with plan to transition to supplemental oxygen to maintain O2 sat > 94% with eventual plan to wean patient off oxygen as tolerated   Hypokalemia K+ 3.1; this will be replenished   Hyperglycemia This may be steroid-induced considering several recent admissions due to COPD exacerbation Continue ISS and hypoglycemia protocol   Leukocytosis This is possibly reactive due to having been on steroid No obvious sign of any acute infectious process at this time Continue to monitor WBC with morning labs.   Anxiety Continue Celexa    Tobacco abuse Patient was counseled on tobacco abuse cessation Continue nicotine  patch   DVT prophylaxis:Lovenox  Code Status: Full Family Communication: None at bedside Disposition Plan:  Status is: Observation The patient will require care spanning > 2 midnights and should be moved to inpatient  because: Need for IV medications.  Consultants:  None  Procedures:  None  Antimicrobials:  Anti-infectives (From admission, onward)    Start     Dose/Rate Route Frequency Ordered Stop   04/21/24 1000  azithromycin  (ZITHROMAX ) tablet 250 mg       Placed in Followed by Linked Group   250 mg Oral Daily 04/20/24 0735 04/25/24 0959   04/20/24 1000  azithromycin  (ZITHROMAX ) tablet 500 mg       Placed in Followed by Linked Group   500 mg Oral Daily 04/20/24 0735 04/20/24 0906       Subjective: Patient seen and evaluated today with complaints of ongoing anxiety and pain complaints.  She remains on BiPAP this morning.  Objective: Vitals:   04/20/24 0930 04/20/24 0953 04/20/24 1100 04/20/24 1158  BP: 131/73  122/85   Pulse: (!) 103 (!) 105 (!) 117   Resp: (!) 24 (!) 23 (!) 30   Temp:    97.7 F (36.5 C)  TempSrc:    Oral  SpO2: 95% 95% 93%   Weight:    55.9 kg  Height:    5' 5 (1.651 m)   No intake or output data in the 24 hours ending 04/20/24 1203 Filed Weights   04/20/24 0352 04/20/24 1158  Weight: 53.5 kg 55.9 kg    Examination:  General exam: Appears calm and comfortable  Respiratory system: Diminished and minimal wheezing bilaterally.  Currently on BiPAP. Cardiovascular system: S1 & S2 heard, RRR.  Gastrointestinal system: Abdomen is soft Central nervous system: Alert and awake Extremities: No edema Skin: No significant lesions noted Psychiatry: Flat affect.    Data Reviewed: I have personally reviewed following labs and imaging studies  CBC: Recent Labs  Lab 04/20/24 0334  WBC 16.9*  NEUTROABS 10.0*  HGB 10.8*  HCT 33.9*  MCV 91.9  PLT 337   Basic Metabolic Panel: Recent Labs  Lab 04/20/24 0334  NA 138  K 3.1*  CL 105  CO2 25  GLUCOSE 270*  BUN 13  CREATININE 0.55  CALCIUM  7.7*  MG 3.6*  PHOS 2.6   GFR: Estimated Creatinine Clearance: 61.9 mL/min (by C-G formula based on SCr of 0.55 mg/dL). Liver Function Tests: No results for  input(s): AST, ALT, ALKPHOS, BILITOT, PROT, ALBUMIN in the last 168 hours. No results for input(s): LIPASE, AMYLASE in the last 168 hours. No results for input(s): AMMONIA in the last 168 hours. Coagulation Profile: No results for input(s): INR, PROTIME in the last 168 hours. Cardiac Enzymes: No results for input(s): CKTOTAL, CKMB, CKMBINDEX, TROPONINI in the last 168 hours. BNP (last 3 results) No results for input(s): PROBNP in the last 8760 hours. HbA1C: No results for input(s): HGBA1C in the last 72 hours. CBG: Recent Labs  Lab 04/20/24 0319  GLUCAP 234*   Lipid Profile: No results for input(s): CHOL, HDL, LDLCALC, TRIG, CHOLHDL, LDLDIRECT in the last 72 hours. Thyroid  Function Tests: No results for input(s): TSH, T4TOTAL, FREET4, T3FREE, THYROIDAB in the last 72 hours. Anemia Panel: No results for input(s): VITAMINB12, FOLATE, FERRITIN, TIBC, IRON, RETICCTPCT in the last 72 hours. Sepsis Labs: No results for input(s): PROCALCITON, LATICACIDVEN in the last 168 hours.  No results found for this or any previous visit (from the past 240 hours).       Radiology Studies: DG Chest Portable 1 View Result Date: 04/20/2024 CLINICAL DATA:  Shortness of breath EXAM: PORTABLE CHEST 1 VIEW COMPARISON:  03/31/2024 FINDINGS: Cardiac shadow is stable. The lungs are well aerated bilaterally. No focal infiltrate or sizable effusion is seen. No bony abnormality is noted. Postsurgical changes in the left shoulder are noted. IMPRESSION: No active disease. Electronically Signed   By: Violeta Grey M.D.   On: 04/20/2024 03:43        Scheduled Meds:  [START ON 04/21/2024] azithromycin   250 mg Oral Daily   budesonide  (PULMICORT ) nebulizer solution  0.25 mg Nebulization BID   [START ON 04/21/2024] Chlorhexidine  Gluconate Cloth  6 each Topical Q0600   [START ON 04/21/2024] dexamethasone  (DECADRON ) injection  10 mg Intravenous  Q24H   dextromethorphan -guaiFENesin   1 tablet Oral BID   enoxaparin  (LOVENOX ) injection  40 mg Subcutaneous Q24H   insulin  aspart  0-15 Units Subcutaneous TID WC   levalbuterol        levalbuterol   0.63 mg Nebulization Q6H   pantoprazole   40 mg Oral Daily     LOS: 0 days    Time spent: 55 minutes    Oddie Kuhlmann Loran Rock, DO [facility]  If 7PM-7AM, please contact night-coverage [email or web address] 04/20/2024, 12:03 PM

## 2024-04-20 NOTE — Progress Notes (Signed)
 Patient taken off of Bipap and placed on 7L HFNC. Patient is tolerating well at this time. Patient states she feels a lot better. Bipap remains at bedside when needed.

## 2024-04-20 NOTE — ED Notes (Addendum)
 18g placed in left forearm due to pt continuing to bend right arm while using cell phone. Potassium infusion moved to left forearm. Pt continues to ask for oxycodone  stating she usually takes it with xanax . Provider notified earlier of pt's requests for oxycodone . Again offered tylenol  and pt refuses.

## 2024-04-20 NOTE — Telephone Encounter (Signed)
 Medications cannot be refilled while she is admitted in the hospital. She will have to wait until she is discharged

## 2024-04-20 NOTE — Progress Notes (Signed)
 Patient home medications collected and controlled medications (xanax  and Oxycodone ) counted by two RN Hydrographic surveyor and OP) in front of patient and sealed up in security bag. Medications taken up to Pharmacy by OP. Patient alert and oriented and aware.

## 2024-04-20 NOTE — Telephone Encounter (Signed)
 Copied from CRM [id number]. Topic: Clinical - Medication Refill >> Apr 20, 2024 10:48 AM Oddis Bench wrote: Medication: ALPRAZolam  (XANAX ) tablet 1 mg  Has the patient contacted their pharmacy? No Patient is in hospital  This is the patient's preferred pharmacy:  [id number] - [place], [place] - [address] [place] [address] Phone: [phone] Fax: [phone]    Is this the correct pharmacy for this prescription? Yes If no, delete pharmacy and type the correct one.   Has the prescription been filled recently? Yes  Is the patient out of the medication? Yes  Has the patient been seen for an appointment in the last year OR does the patient have an upcoming appointment? Yes  Can we respond through MyChart? No  Agent: Please be advised that Rx refills may take up to 3 business days. We ask that you follow-up with your pharmacy.

## 2024-04-20 NOTE — H&P (Signed)
 History and Physical    Patient: Casey [name] ZOX:[id number] DOB: 1961/08/06 DOA: 04/20/2024 DOS: the patient was seen and examined on 04/20/2024 PCP: Zarwolo, Gloria, FNP  Patient coming from: Home  Chief Complaint:  Chief Complaint  Patient presents with   Respiratory Distress   HPI: Casey [name] is a 63 y.o. female with medical history significant of COPD, depression, anxiety, chronic back pain, polysubstance abuse who presents to the emergency department due to shortness of breath.  Patient was unable to provide more detailed history due to being on BiPAP, most of the history was obtained from ED physician and ED medical record.  Per report, patient presents with increasing shortness of breath due to COPD and activated EMS, but was not going to go to the hospital after already arrival of EMS team, but was convinced due to being in respiratory distress and O2 sats being 84% on room air.  She was treated with Atrovent , albuterol  and magnesium  en route, she declined Solu-Medrol .  She requested pain meds for generalized body aches.  Patient was admitted from 5/23 to 5/27 due to Acute hypoxic respiratory failure due to AECOPD.  He was also admitted from 5/20 to 5/22 due to same condition -acute on chronic hypoxemic respiratory failure secondary to recurrent COPD exacerbation  ED Course:  In the emergency department, she was tachypneic with respiratory rate of 59/min, but this improved to 31/min, pulse 121 bpm, BP 130/98, temperature 98.5 F, O2 sat 95% on BiPAP at FiO2 of 50%.  Workup in the ED showed WBC 16.9, hemoglobin 10.8, hematocrit 33.9, MCV 91.9, platelets 337.  BMP was normal except for potassium of 3.1 and blood glucose of 270. Chest x-ray showed no active disease Patient was treated with IV Decadron  10 mg and IV Ativan  0.5 mg x 1.  TR[facility] was asked to admit patient.  Review of Systems: Review of systems as noted in the HPI. All other systems reviewed and are negative.   Past  Medical History:  Diagnosis Date   Anxiety    Chronic back pain    COPD (chronic obstructive pulmonary disease) (HCC)    Depression    History of panic attacks 09/11/2014   Medical history non-contributory    Pneumonia    Polysubstance abuse (HCC)    History of taking non-prescribed opiates, BZDs; also +cocaine and THC   Ruptured disc, thoracic    Suicide attempt (HCC) 2002   Past Surgical History:  Procedure Laterality Date   CESAREAN SECTION     CLAVICLE SURGERY     REVERSE SHOULDER ARTHROPLASTY Left 01/01/2020   Procedure: LEFT REVERSE SHOULDER ARTHROPLASTY;  Surgeon: Jasmine Mesi, MD;  Location: [facility] OR;  Service: Orthopedics;  Laterality: Left;   TUBAL LIGATION      Social History:  reports that she has been smoking cigarettes. She has never used smokeless tobacco. She reports that she does not currently use drugs after having used the following drugs: Crack cocaine and Benzodiazepines. No history on file for alcohol use.   Allergies  Allergen Reactions   Methylprednisolone  Anxiety    Family History  Problem Relation Age of Onset   Diabetes Mother    Pulmonary fibrosis Mother    Heart attack Father    Parkinson's disease Sister    Bipolar disorder Sister    Thyroid  disease Sister    Heart attack Brother    Other Son        vascular neucrosis   Pulmonary fibrosis Maternal Grandmother  Diabetes Maternal Grandfather    Stroke Maternal Grandfather    Pneumonia Paternal Grandfather    Cancer Sister        melonoma   Other Brother        back problems, nerve problems, soft bones     Prior to Admission medications   Medication Sig Start Date End Date Taking? Authorizing Provider  acetaminophen  (TYLENOL ) 325 MG tablet Take 2 tablets (650 mg total) by mouth every 6 (six) hours as needed for mild pain (or Fever >/= 101). 08/17/23   Colin Dawley, MD  albuterol  (VENTOLIN  HFA) 108 (90 Base) MCG/ACT inhaler Inhale 2 puffs into the lungs every 4 (four) hours as  needed for wheezing or shortness of breath. 03/30/24   Johnson, Clanford L, MD  ALPRAZolam  (XANAX ) 1 MG tablet Take 1 tablet (1 mg total) by mouth 3 (three) times daily as needed for anxiety. Patient taking differently: Take 1 mg by mouth in the morning, at noon, and at bedtime. 03/30/24   Johnson, Clanford L, MD  budesonide  (PULMICORT ) 0.25 MG/2ML nebulizer solution Take 2 mLs (0.25 mg total) by nebulization 2 (two) times daily. 04/04/24   Wynetta Heckle, MD  citalopram  (CELEXA ) 20 MG tablet Take 1 tablet (20 mg total) by mouth daily. 03/22/24   Demaris Fillers, MD  feeding supplement (ENSURE ENLIVE / ENSURE PLUS) LIQD Take 237 mLs by mouth 3 (three) times daily between meals. 08/17/23   Colin Dawley, MD  guaiFENesin  (MUCINEX ) 600 MG 12 hr tablet Take 2 tablets (1,200 mg total) by mouth 2 (two) times daily. 04/04/24   Wynetta Heckle, MD  ipratropium-albuterol  (DUONEB) 0.5-2.5 (3) MG/3ML SOLN Four times daily 04/06/24   Wert, Michael B, MD  methocarbamol  (ROBAXIN ) 500 MG tablet Take 500 mg by mouth every 8 (eight) hours as needed for muscle spasms.    [provider]  nicotine  (NICODERM CQ  - DOSED IN MG/24 HR) 7 mg/24hr patch Place 1 patch (7 mg total) onto the skin daily. 04/06/24   Diamond Formica, MD  OXYGEN Inhale 3 L into the lungs continuous.    [provider]  predniSONE  (DELTASONE ) 20 MG tablet Take 3 PO QAM x5days, 2 PO QAM x5days, 1 PO QAM x5days Patient taking differently: Take 20-60 mg by mouth See admin instructions. Take 3 DAILY x5days, 2 DAILY x5days, 1 DAILY x5days STARTING ON 03/30/2024 03/31/24   [name], MD    Physical Exam: BP 122/73   Pulse (!) 106   Temp 98.5 F (36.9 C) (Axillary)   Resp (!) 29   Ht 5' 5 (1.651 m)   Wt 53.5 kg   LMP  (LMP Unknown)   SpO2 96%   BMI 19.63 kg/m   General: 63 y.o. year-old female ill appearing, but in no acute distress.  Alert and oriented x3. HEENT: NCAT, EOMI Neck: Supple, trachea medial Cardiovascular: Regular  rate and rhythm with no rubs or gallops.  No thyromegaly or JVD noted.  No lower extremity edema. 2/4 pulses in all 4 extremities. Respiratory: Coarse breath sounds with diffuse expiratory wheezes.   Abdomen: Soft, nontender nondistended with normal bowel sounds x4 quadrants. Muskuloskeletal: No cyanosis, clubbing or edema noted bilaterally Neuro: CN II-XII intact, strength 5/5 x 4, sensation, reflexes intact Skin: No ulcerative lesions noted or rashes Psychiatry: Judgement and insight appear normal. Mood is appropriate for condition and setting          Labs on Admission:  Basic Metabolic Panel: Recent Labs  Lab 04/20/24  0334  NA 138  K 3.1*  CL 105  CO2 25  GLUCOSE 270*  BUN 13  CREATININE 0.55  CALCIUM  7.7*   Liver Function Tests: No results for input(s): AST, ALT, ALKPHOS, BILITOT, PROT, ALBUMIN in the last 168 hours. No results for input(s): LIPASE, AMYLASE in the last 168 hours. No results for input(s): AMMONIA in the last 168 hours. CBC: Recent Labs  Lab 04/20/24 0334  WBC 16.9*  NEUTROABS 10.0*  HGB 10.8*  HCT 33.9*  MCV 91.9  PLT 337   Cardiac Enzymes: No results for input(s): CKTOTAL, CKMB, CKMBINDEX, TROPONINI in the last 168 hours.  BNP (last 3 results) Recent Labs    07/28/23 1353 11/06/23 2000 03/17/24 0842  BNP 154.0* 41.0 15.0    ProBNP (last 3 results) No results for input(s): PROBNP in the last 8760 hours.  CBG: No results for input(s): GLUCAP in the last 168 hours.  Radiological Exams on Admission: DG Chest Portable 1 View Result Date: 04/20/2024 CLINICAL DATA:  Shortness of breath EXAM: PORTABLE CHEST 1 VIEW COMPARISON:  03/31/2024 FINDINGS: Cardiac shadow is stable. The lungs are well aerated bilaterally. No focal infiltrate or sizable effusion is seen. No bony abnormality is noted. Postsurgical changes in the left shoulder are noted. IMPRESSION: No active disease. Electronically Signed   By: Violeta Grey  M.D.   On: 04/20/2024 03:43    EKG: I independently viewed the EKG done and my findings are as followed: Sinus tachycardia at a rate of 121 bpm with nonspecific T wave abnormality.  Assessment/Plan Present on Admission:  Acute exacerbation of chronic obstructive pulmonary disease (COPD) (HCC)  Acute and chronic respiratory failure with hypoxia (HCC)  Hypokalemia  Hyperglycemia  Leukocytosis  Tobacco abuse  Principal Problem:   Acute exacerbation of chronic obstructive pulmonary disease (COPD) (HCC) Active Problems:   Tobacco abuse   Acute and chronic respiratory failure with hypoxia (HCC)   Hypokalemia   Hyperglycemia   Leukocytosis   Anxiety  Acute exacerbation of COPD Acute on chronic respiratory failure with hypoxia requiring NIPPV Continue Xopenex , Atrovent , Mucinex , Solu-Medrol , azithromycin . Continue Protonix  to prevent steroid-induced ulcer Continue incentive spirometry and flutter valve Continue BiPAP with plan to transition to supplemental oxygen to maintain O2 sat > 94% with eventual plan to wean patient off oxygen as tolerated  Hypokalemia K+ 3.1; this will be replenished  Hyperglycemia This may be steroid-induced considering several recent admissions due to COPD exacerbation Continue ISS and hypoglycemia protocol  Leukocytosis This is possibly reactive due to having been on steroid No obvious sign of any acute infectious process at this time Continue to monitor WBC with morning labs.  Anxiety Continue Celexa   Tobacco abuse Patient was counseled on tobacco abuse cessation Continue nicotine  patch  DVT prophylaxis: Lovenox    Code Status: Full code  Family Communication: None at bedside  Consults: None  Severity of Illness: The appropriate patient status for this patient is OBSERVATION. Observation status is judged to be reasonable and necessary in order to provide the required intensity of service to ensure the patient's safety. The patient's  presenting symptoms, physical exam findings, and initial radiographic and laboratory data in the context of their medical condition is felt to place them at decreased risk for further clinical deterioration. Furthermore, it is anticipated that the patient will be medically stable for discharge from the hospital within 2 midnights of admission.   Author: Blain Hunsucker, DO 04/20/2024 7:51 AM  For on call review [email or web address].

## 2024-04-20 NOTE — Plan of Care (Signed)

## 2024-04-20 NOTE — TOC Initial Note (Signed)
 Transition of Care [facility]) - Initial/Assessment Note    Patient Details  Name: Casey [name] MRN: [id number] Date of Birth: 13-May-1961  Transition of Care [facility]) CM/SW Contact:    Orelia Binet, RN Phone Number: 04/20/2024, 1:09 PM  Clinical Narrative:                  Transition of Care [facility]) - Inpatient Brief Assessment   Patient Details  Name: Casey [name] MRN: [id number] Date of Birth: 08-Mar-1961  Transition of Care [facility]) CM/SW Contact:    Orelia Binet, RN Phone Number: 04/20/2024, 1:12 PM   Clinical Narrative:  Patient in OBS for acute exacerbation of chronic obstructive pulmonary disease. Patient has home oxygen. Multiple recent admission and assessed. TOC following.    Transition of Care Asessment: Insurance and Status: Insurance coverage has been reviewed Patient has primary care physician: Yes Home environment has been reviewed: Home Prior level of function:: Independent Prior/Current Home Services: Current home services (Home oxygen) Social Drivers of Health Review: SDOH reviewed no interventions necessary Readmission risk has been reviewed: Yes Transition of care needs: no transition of care needs at this time

## 2024-04-20 NOTE — ED Notes (Signed)
 Pt has refused tylenol  multiple times for pain and continuously asks for oxycodone . Provider is aware. Pt has to be continuously reminded to keep right arm straight to keep IV meds flowing.

## 2024-04-20 NOTE — Telephone Encounter (Signed)
 Copied from CRM [id number]. Topic: Clinical - Lab/Test Results >> Apr 20, 2024  8:07 AM Zipporah Him wrote: Reason for CRM: Please call patient back when lab results are available from 6/9 per her request

## 2024-04-20 NOTE — Telephone Encounter (Signed)
 Noted, Labs not available yet.

## 2024-04-20 NOTE — ED Notes (Signed)
 Pt continuously asking to come off bi-pap and requests food. Respiratory notified and pt now of bi-pap. Pt ate sandwich and drank soda. Pt now asking for dessert.

## 2024-04-20 NOTE — ED Provider Notes (Signed)
 [place] EMERGENCY DEPARTMENT AT [facility] Provider Note   CSN: [id number] Arrival date & time: 04/20/24  4401     History  Chief Complaint  Patient presents with   Respiratory Distress   Level 5 caveat due to acuity of condition Casey [name] is a 63 y.o. female.  The history is provided by the patient and the EMS personnel. The history is limited by the condition of the patient.   Patient w/history of COPD presents with increasing shortness of breath.  Patient called EMS due to COPD and initially refused to come the hospital.  Patient finally was convinced to come to the hospital and was noted to be in respiratory distress.  She was given albuterol , Atrovent  and magnesium .  Patient declined Solu-Medrol .  Initial pulse ox was 84% on room air   Patient requesting pain medicine for pain all over her body  Patient reports she is breast-fed on 3 L at home but is unclear if she is using this Home Medications Prior to Admission medications   Medication Sig Start Date End Date Taking? Authorizing Provider  acetaminophen  (TYLENOL ) 325 MG tablet Take 2 tablets (650 mg total) by mouth every 6 (six) hours as needed for mild pain (or Fever >/= 101). 08/17/23   Colin Dawley, MD  albuterol  (VENTOLIN  HFA) 108 (90 Base) MCG/ACT inhaler Inhale 2 puffs into the lungs every 4 (four) hours as needed for wheezing or shortness of breath. 03/30/24   Johnson, Clanford L, MD  ALPRAZolam  (XANAX ) 1 MG tablet Take 1 tablet (1 mg total) by mouth 3 (three) times daily as needed for anxiety. Patient taking differently: Take 1 mg by mouth in the morning, at noon, and at bedtime. 03/30/24   Johnson, Clanford L, MD  budesonide  (PULMICORT ) 0.25 MG/2ML nebulizer solution Take 2 mLs (0.25 mg total) by nebulization 2 (two) times daily. 04/04/24   Wynetta Heckle, MD  citalopram  (CELEXA ) 20 MG tablet Take 1 tablet (20 mg total) by mouth daily. 03/22/24   Demaris Fillers, MD  feeding supplement (ENSURE ENLIVE /  ENSURE PLUS) LIQD Take 237 mLs by mouth 3 (three) times daily between meals. 08/17/23   Colin Dawley, MD  guaiFENesin  (MUCINEX ) 600 MG 12 hr tablet Take 2 tablets (1,200 mg total) by mouth 2 (two) times daily. 04/04/24   Wynetta Heckle, MD  ipratropium-albuterol  (DUONEB) 0.5-2.5 (3) MG/3ML SOLN Four times daily 04/06/24   Wert, Michael B, MD  methocarbamol  (ROBAXIN ) 500 MG tablet Take 500 mg by mouth every 8 (eight) hours as needed for muscle spasms.    [provider]  nicotine  (NICODERM CQ  - DOSED IN MG/24 HR) 7 mg/24hr patch Place 1 patch (7 mg total) onto the skin daily. 04/06/24   Diamond Formica, MD  OXYGEN Inhale 3 L into the lungs continuous.    [provider]  predniSONE  (DELTASONE ) 20 MG tablet Take 3 PO QAM x5days, 2 PO QAM x5days, 1 PO QAM x5days Patient taking differently: Take 20-60 mg by mouth See admin instructions. Take 3 DAILY x5days, 2 DAILY x5days, 1 DAILY x5days STARTING ON 03/30/2024 03/31/24   [name], MD      Allergies    Methylprednisolone     Review of Systems   Review of Systems  Unable to perform ROS: Acuity of condition    Physical Exam Updated Vital Signs BP (!) 130/98   Pulse (!) 121   Temp 98.5 F (36.9 C) (Axillary)   Resp (!) 59  Ht 1.651 m (5' 5)   Wt 53.5 kg   LMP  (LMP Unknown)   SpO2 95% Comment: bipap  BMI 19.63 kg/m  Physical Exam CONSTITUTIONAL: Anxious and ill appearing HEAD: Normocephalic/atraumatic EYES: EOMI/PERRL ENMT: Mucous membranes moist, no stridor or drooling CV: S1/S2 noted, tachycardic LUNGS wheezing bilaterally tachypnea ABDOMEN: soft, nontender NEURO: Pt is awake/alert/appropriate, moves all extremitiesx4.  No facial droop.   EXTREMITIES: pulses normal/equal, full ROM, no lower extremity edema SKIN: warm, color normal PSYCH: Anxious  ED Results / Procedures / Treatments   Labs (all labs ordered are listed, but only abnormal results are displayed) Labs Reviewed  CBC WITH  DIFFERENTIAL/PLATELET - Abnormal; Notable for the following components:      Result Value   WBC 16.9 (*)    RBC 3.69 (*)    Hemoglobin 10.8 (*)    HCT 33.9 (*)    Neutro Abs 10.0 (*)    Lymphs Abs 4.7 (*)    Monocytes Absolute 1.3 (*)    Eosinophils Absolute 0.7 (*)    All other components within normal limits  BASIC METABOLIC PANEL WITH GFR - Abnormal; Notable for the following components:   Potassium 3.1 (*)    Glucose, Bld 270 (*)    Calcium  7.7 (*)    All other components within normal limits  BLOOD GAS, VENOUS - Abnormal; Notable for the following components:   pO2, Ven 59 (*)    All other components within normal limits    EKG EKG Interpretation Date/Time:  Thursday April 20 2024 03:25:17 EDT Ventricular Rate:  121 PR Interval:  159 QRS Duration:  91 QT Interval:  325 QTC Calculation: 462 R Axis:   42  Text Interpretation: Sinus tachycardia Nonspecific T abnrm, anterolateral leads Interpretation limited secondary to artifact Confirmed by Eldon Greenland ([id number]) on 04/20/2024 3:30:29 AM  Radiology DG Chest Portable 1 View Result Date: 04/20/2024 CLINICAL DATA:  Shortness of breath EXAM: PORTABLE CHEST 1 VIEW COMPARISON:  03/31/2024 FINDINGS: Cardiac shadow is stable. The lungs are well aerated bilaterally. No focal infiltrate or sizable effusion is seen. No bony abnormality is noted. Postsurgical changes in the left shoulder are noted. IMPRESSION: No active disease. Electronically Signed   By: Violeta Grey M.D.   On: 04/20/2024 03:43    Procedures .Critical Care  Performed by: Eldon Greenland, MD Authorized by: Eldon Greenland, MD   Critical care provider statement:    Critical care time (minutes):  80   Critical care start time:  04/20/2024 3:30 AM   Critical care end time:  04/20/2024 4:50 AM   Critical care time was exclusive of:  Separately billable procedures and treating other patients   Critical care was necessary to treat or prevent imminent or  life-threatening deterioration of the following conditions:  Respiratory failure   Critical care was time spent personally by me on the following activities:  Obtaining history from patient or surrogate, examination of patient, evaluation of patient's response to treatment, development of treatment plan with patient or surrogate, pulse oximetry, ordering and review of radiographic studies, ordering and review of laboratory studies, re-evaluation of patient's condition, ordering and performing treatments and interventions and review of old charts   I assumed direction of critical care for this patient from another provider in my specialty: no     Care discussed with: admitting provider       Medications Ordered in ED Medications  dexamethasone  (DECADRON ) injection 10 mg (10 mg Intravenous Given 04/20/24 0328)  LORazepam  (ATIVAN ) injection  0.5 mg (0.5 mg Intravenous Given 04/20/24 0332)  ipratropium (ATROVENT ) 0.02 % nebulizer solution (1 mg  Given 04/20/24 0335)  levalbuterol  (XOPENEX ) 1.25 MG/0.5ML nebulizer solution (5 mg  Given 04/20/24 1610)    ED Course/ Medical Decision Making/ A&P Clinical Course as of 04/20/24 0454  Thu Apr 20, 2024  0335 Patient seen on arrival for respiratory distress.  She is awake alert, tachypneic and anxious though protecting her airway.  Will begin with noninvasive ventilation, will follow closely and anticipate admission [DW]  0411 Patient overall appears improved, tolerating BiPAP [DW]  0411 WBC(!): 16.9 Leukocytosis [DW]  0411 Hemoglobin(!): 10.8 Chronic anemia [DW]  0454 Patient continues to rest comfortably on BiPAP, vital signs are improving.  However she is still wheezing and will need to be admitted Discussed with Dr. Adefeso for admission [DW]    Clinical Course User Index [DW] Eldon Greenland, MD                                 Medical Decision Making Amount and/or Complexity of Data Reviewed Labs: ordered. Decision-making details documented in  ED Course. Radiology: ordered. ECG/medicine tests: ordered.  Risk Prescription drug management.   This patient presents to the ED for concern of shortness of breath, this involves an extensive number of treatment options, and is a complaint that carries with it a high risk of complications and morbidity.  The differential diagnosis includes but is not limited to Acute coronary syndrome, pneumonia, acute pulmonary edema, pneumothorax, acute anemia, pulmonary embolism COPD exacerbation  Comorbidities that complicate the patient evaluation: Patient's presentation is complicated by their history of COPD  Social Determinants of Health: Patient's frequent ER visits  increases the complexity of managing their presentation  Additional history obtained: Additional history obtained from EMS  Records reviewed previous admission documents  Lab Tests: I Ordered, and personally interpreted labs.  The pertinent results include: hyperGlycemia, leukocytosis  Imaging Studies ordered: I ordered imaging studies including X-ray chest  I independently visualized and interpreted imaging which showed no acute findings, no pneumonia or heart failure I agree with the radiologist interpretation  Cardiac Monitoring: The patient was maintained on a cardiac monitor.  I personally viewed and interpreted the cardiac monitor which showed an underlying rhythm of:  sinus tachycardia  Medicines ordered and prescription drug management: I ordered medication including DexaMethasone  for wheezing Reevaluation of the patient after these medicines showed that the patient    improved   Critical Interventions:   BiPAP  Consultations Obtained: I requested consultation with the admitting physician [facility], and discussed  findings as well as pertinent plan - they recommend: Admit  Reevaluation: After the interventions noted above, I reevaluated the patient and found that they have :improved  Complexity of problems  addressed: Patient's presentation is most consistent with  acute presentation with potential threat to life or bodily function  Disposition: After consideration of the diagnostic results and the patient's response to treatment,  I feel that the patent would benefit from admission  .           Final Clinical Impression(s) / ED Diagnoses Final diagnoses:  COPD exacerbation (HCC)  Acute respiratory failure with hypoxia [facility])    Rx / DC Orders ED Discharge Orders     None         Eldon Greenland, MD 04/20/24 [phone]

## 2024-04-21 ENCOUNTER — Inpatient Hospital Stay (HOSPITAL_COMMUNITY): Payer: MEDICAID

## 2024-04-21 ENCOUNTER — Other Ambulatory Visit: Payer: Self-pay | Admitting: Family Medicine

## 2024-04-21 ENCOUNTER — Telehealth: Payer: Self-pay

## 2024-04-21 DIAGNOSIS — J441 Chronic obstructive pulmonary disease with (acute) exacerbation: Secondary | ICD-10-CM | POA: Diagnosis not present

## 2024-04-21 LAB — COMPREHENSIVE METABOLIC PANEL WITH GFR
ALT: 12 U/L (ref 0–44)
AST: 8 U/L — ABNORMAL LOW (ref 15–41)
Albumin: 3 g/dL — ABNORMAL LOW (ref 3.5–5.0)
Alkaline Phosphatase: 65 U/L (ref 38–126)
Anion gap: 8 (ref 5–15)
BUN: 11 mg/dL (ref 8–23)
CO2: 30 mmol/L (ref 22–32)
Calcium: 8.7 mg/dL — ABNORMAL LOW (ref 8.9–10.3)
Chloride: 102 mmol/L (ref 98–111)
Creatinine, Ser: 0.48 mg/dL (ref 0.44–1.00)
GFR, Estimated: >60 mL/min (ref >60)
Glucose, Bld: 125 mg/dL — ABNORMAL HIGH (ref 70–99)
Potassium: 4 mmol/L (ref 3.5–5.1)
Sodium: 140 mmol/L (ref 135–145)
Total Bilirubin: 0.3 mg/dL (ref 0.0–1.2)
Total Protein: 5.9 g/dL — ABNORMAL LOW (ref 6.5–8.1)

## 2024-04-21 LAB — BLOOD GAS, ARTERIAL
Acid-Base Excess: 13 mmol/L — ABNORMAL HIGH (ref 0.0–2.0)
Bicarbonate: 40.6 mmol/L — ABNORMAL HIGH (ref 20.0–28.0)
Drawn by: 22179
O2 Saturation: 100 %
Patient temperature: 36.5
pCO2 arterial: 63 mmHg — ABNORMAL HIGH (ref 32–48)
pH, Arterial: 7.42 (ref 7.35–7.45)
pO2, Arterial: 126 mmHg — ABNORMAL HIGH (ref 83–108)

## 2024-04-21 LAB — CBC
HCT: 32.9 % — ABNORMAL LOW (ref 36.0–46.0)
Hemoglobin: 10.1 g/dL — ABNORMAL LOW (ref 12.0–15.0)
MCH: 28.2 pg (ref 26.0–34.0)
MCHC: 30.7 g/dL (ref 30.0–36.0)
MCV: 91.9 fL (ref 80.0–100.0)
Platelets: 316 10*3/uL (ref 150–400)
RBC: 3.58 MIL/uL — ABNORMAL LOW (ref 3.87–5.11)
RDW: 13.5 % (ref 11.5–15.5)
WBC: 12.4 10*3/uL — ABNORMAL HIGH (ref 4.0–10.5)
nRBC: 0 % (ref 0.0–0.2)

## 2024-04-21 LAB — HEMOGLOBIN A1C
Hgb A1c MFr Bld: 5.9 % — ABNORMAL HIGH (ref 4.8–5.6)
Mean Plasma Glucose: 123 mg/dL

## 2024-04-21 LAB — GLUCOSE, CAPILLARY
Glucose-Capillary: 137 mg/dL — ABNORMAL HIGH (ref 70–99)
Glucose-Capillary: 142 mg/dL — ABNORMAL HIGH (ref 70–99)
Glucose-Capillary: 191 mg/dL — ABNORMAL HIGH (ref 70–99)
Glucose-Capillary: 153 mg/dL — ABNORMAL HIGH (ref 70–99)

## 2024-04-21 LAB — MAGNESIUM: Magnesium: 2 mg/dL (ref 1.7–2.4)

## 2024-04-21 MED ORDER — LORAZEPAM 2 MG/ML IJ SOLN
1.0000 mg | INTRAMUSCULAR | Status: DC | PRN
  Administered 2024-04-21: 1 mg via INTRAVENOUS
  Filled 2024-04-21: qty 1

## 2024-04-21 MED ORDER — METHOCARBAMOL 500 MG PO TABS: 500.0000 mg | ORAL_TABLET | Freq: Three times a day (TID) | ORAL | Status: DC | PRN

## 2024-04-21 NOTE — Plan of Care (Signed)
  Problem: Coping: Goal: Ability to adjust to condition or change in health will improve Outcome: Progressing   Problem: Fluid Volume: Goal: Ability to maintain a balanced intake and output will improve Outcome: Progressing   Problem: Metabolic: Goal: Ability to maintain appropriate glucose levels will improve Outcome: Progressing   Problem: Nutritional: Goal: Maintenance of adequate nutrition will improve Outcome: Progressing Goal: Progress toward achieving an optimal weight will improve Outcome: Progressing

## 2024-04-21 NOTE — Progress Notes (Addendum)
 0845:  Patient SpO2 dropped to the 60s per NT when the patient got up to the [facility]. Upon assessment SpO2 was low 80s on 7L HFNC. Respirations increased to 30, labored breathing noted, and the patient was sitting in the tripod position. Patient was switched from 7 liters HFNC to Bi-PAP. RT called and came to bedside. Dr. Mason Sole made aware. SpO2 improved to 96 % on Bi-PAP and respirations decreased to 22.   1200:  In order to allow the patient to eat lunch the patient was placed on 15 liters HFNC. Patient's SpO2 dropped to 79% and patient was placed on a non-re breather.   Patient was placed back on Bi-PAP at 1235. SpO2 currently 95% and respirations 23. Dr. Mason Sole has been made aware.

## 2024-04-21 NOTE — Progress Notes (Signed)
 PROGRESS NOTE    Casey [name]  ZOX:[id number] DOB: 1960-12-01 DOA: 04/20/2024 PCP: Zarwolo, Gloria, FNP   Brief Narrative:    Casey [name] is a 63 y.o. female with medical history significant of COPD with chronic hypoxemia on 3 L nasal cannula at home, depression, anxiety, chronic back pain, polysubstance abuse who presents to the emergency department due to shortness of breath.  She has been readmitted with acute on chronic hypoxemic respiratory failure secondary to COPD exacerbation.  She is also noted to have significant symptoms of anxiety.  Assessment & Plan:   Principal Problem:   Acute exacerbation of chronic obstructive pulmonary disease (COPD) (HCC) Active Problems:   Tobacco abuse   Acute and chronic respiratory failure with hypoxia (HCC)   Hypokalemia   Hyperglycemia   Leukocytosis   Anxiety  Assessment and Plan:   Acute exacerbation of COPD Acute on chronic respiratory failure with hypoxia requiring NIPPV Patient wears 3 L nasal cannula at home Continue Xopenex , Atrovent , Mucinex , Decadron , azithromycin . Continue Protonix  to prevent steroid-induced ulcer Continue incentive spirometry and flutter valve Continue BiPAP with plan to transition to supplemental oxygen to maintain O2 sat > 94% with eventual plan to wean patient off oxygen as tolerated    Hyperglycemia-improved This may be steroid-induced considering several recent admissions due to COPD exacerbation Continue ISS and hypoglycemia protocol   Leukocytosis-improving This is possibly reactive due to having been on steroid A1c 5.9% No obvious sign of any acute infectious process at this time Continue to monitor WBC with morning labs.   Anxiety Continue Celexa    Tobacco abuse Patient was counseled on tobacco abuse cessation Continue nicotine  patch   DVT prophylaxis:Lovenox  Code Status: Full Family Communication: None at bedside Disposition Plan:  Status is: Inpatient Remains inpatient  appropriate because: Need for IV medications.   Consultants:  None  Procedures:  None  Antimicrobials:  Anti-infectives (From admission, onward)    Start     Dose/Rate Route Frequency Ordered Stop   04/21/24 1000  azithromycin  (ZITHROMAX ) tablet 250 mg       Placed in Followed by Linked Group   250 mg Oral Daily 04/20/24 0735 04/25/24 0959   04/20/24 1000  azithromycin  (ZITHROMAX ) tablet 500 mg       Placed in Followed by Linked Group   500 mg Oral Daily 04/20/24 0735 04/20/24 0906       Subjective: Patient seen and evaluated today with some mild complaints of anxiety noted this morning and she is requesting her Xanax .  Otherwise states that she continues to have an ongoing cough as well as some shortness of breath, but seems to be doing some better.  Objective: Vitals:   04/21/24 0400 04/21/24 0501 04/21/24 0600 04/21/24 0700  BP: 122/62 121/68 123/73 123/74  Pulse: 91 84 83 85  Resp: (!) 29 (!) 27 18 (!) 25  Temp:  97.7 F (36.5 C)    TempSrc:  Axillary    SpO2: 95% 96% 96% 95%  Weight:      Height:        Intake/Output Summary (Last 24 hours) at 04/21/2024 0926 Last data filed at 04/21/2024 0744 Gross per 24 hour  Intake 277.77 ml  Output 1950 ml  Net -1672.23 ml   Filed Weights   04/20/24 0352 04/20/24 1158  Weight: 53.5 kg 55.9 kg    Examination:  General exam: Appears calm and comfortable  Respiratory system: Diminished and minimal wheezing bilaterally.  7 L nasal cannula. Cardiovascular system: S1 &  S2 heard, RRR.  Gastrointestinal system: Abdomen is soft Central nervous system: Alert and awake Extremities: No edema Skin: No significant lesions noted Psychiatry: Flat affect.    Data Reviewed: I have personally reviewed following labs and imaging studies  CBC: Recent Labs  Lab 04/20/24 0334 04/21/24 0427  WBC 16.9* 12.4*  NEUTROABS 10.0*  --   HGB 10.8* 10.1*  HCT 33.9* 32.9*  MCV 91.9 91.9  PLT 337 316   Basic Metabolic  Panel: Recent Labs  Lab 04/20/24 0334 04/21/24 0427  NA 138 140  K 3.1* 4.0  CL 105 102  CO2 25 30  GLUCOSE 270* 125*  BUN 13 11  CREATININE 0.55 0.48  CALCIUM  7.7* 8.7*  MG 3.6* 2.0  PHOS 2.6  --    GFR: Estimated Creatinine Clearance: 61.9 mL/min (by C-G formula based on SCr of 0.48 mg/dL). Liver Function Tests: Recent Labs  Lab 04/21/24 0427  AST 8*  ALT 12  ALKPHOS 65  BILITOT 0.3  PROT 5.9*  ALBUMIN 3.0*   No results for input(s): LIPASE, AMYLASE in the last 168 hours. No results for input(s): AMMONIA in the last 168 hours. Coagulation Profile: No results for input(s): INR, PROTIME in the last 168 hours. Cardiac Enzymes: No results for input(s): CKTOTAL, CKMB, CKMBINDEX, TROPONINI in the last 168 hours. BNP (last 3 results) No results for input(s): PROBNP in the last 8760 hours. HbA1C: Recent Labs    04/20/24 0334  HGBA1C 5.9*   CBG: Recent Labs  Lab 04/20/24 1218 04/20/24 1556 04/20/24 1722 04/20/24 2122 04/21/24 0811  GLUCAP 237* 136* 132* 118* 137*   Lipid Profile: No results for input(s): CHOL, HDL, LDLCALC, TRIG, CHOLHDL, LDLDIRECT in the last 72 hours. Thyroid  Function Tests: No results for input(s): TSH, T4TOTAL, FREET4, T3FREE, THYROIDAB in the last 72 hours. Anemia Panel: No results for input(s): VITAMINB12, FOLATE, FERRITIN, TIBC, IRON, RETICCTPCT in the last 72 hours. Sepsis Labs: No results for input(s): PROCALCITON, LATICACIDVEN in the last 168 hours.  Recent Results (from the past 240 hours)  MRSA Next Gen by PCR, Nasal     Status: None   Collection Time: 04/20/24 12:00 PM   Specimen: Nasal Mucosa; Nasal Swab  Result Value Ref Range Status   MRSA by PCR Next Gen NOT DETECTED NOT DETECTED Final    Comment: (NOTE) The GeneXpert MRSA Assay (FDA approved for NASAL specimens only), is one component of a comprehensive MRSA colonization surveillance program. It is not intended  to diagnose MRSA infection nor to guide or monitor treatment for MRSA infections. Test performance is not FDA approved in patients less than 52 years old. Performed at [facility], [address] [place], [place] [address]          Radiology Studies: DG Chest Portable 1 View Result Date: 04/20/2024 CLINICAL DATA:  Shortness of breath EXAM: PORTABLE CHEST 1 VIEW COMPARISON:  03/31/2024 FINDINGS: Cardiac shadow is stable. The lungs are well aerated bilaterally. No focal infiltrate or sizable effusion is seen. No bony abnormality is noted. Postsurgical changes in the left shoulder are noted. IMPRESSION: No active disease. Electronically Signed   By: Violeta Grey M.D.   On: 04/20/2024 03:43        Scheduled Meds:  azithromycin   250 mg Oral Daily   budesonide  (PULMICORT ) nebulizer solution  0.25 mg Nebulization BID   Chlorhexidine  Gluconate Cloth  6 each Topical Q0600   citalopram   20 mg Oral Daily   dexamethasone  (DECADRON ) injection  10 mg Intravenous Q24H  dextromethorphan -guaiFENesin   1 tablet Oral BID   enoxaparin  (LOVENOX ) injection  40 mg Subcutaneous Q24H   feeding supplement  237 mL Oral TID BM   insulin  aspart  0-15 Units Subcutaneous TID WC   levalbuterol   0.63 mg Nebulization Q6H   nicotine   7 mg Transdermal Daily   pantoprazole   40 mg Oral Daily     LOS: 1 day    Time spent: 55 minutes    Aleksis Jiggetts Loran Rock, DO [facility]  If 7PM-7AM, please contact night-coverage [email or web address] 04/21/2024, 9:26 AM

## 2024-04-21 NOTE — Telephone Encounter (Signed)
 Copied from CRM [id number]. Topic: Clinical - Lab/Test Results >> Apr 21, 2024 10:35 AM Dorthula Gavel H wrote: Reason for CRM: pt is wanting a call back about drug test results.

## 2024-04-21 NOTE — Evaluation (Signed)
 Clinical/Bedside Swallow Evaluation Patient Details  Name: Casey [name] MRN: [id number] Date of Birth: [birth year]  Today's Date: 04/21/2024 Time: SLP Start Time (ACUTE ONLY): 0945 SLP Stop Time (ACUTE ONLY): 1001 SLP Time Calculation (min) (ACUTE ONLY): 16 min  Past Medical History:  Past Medical History:  Diagnosis Date   Anxiety    Chronic back pain    COPD (chronic obstructive pulmonary disease) (HCC)    Depression    History of panic attacks 09/11/2014   Medical history non-contributory    Pneumonia    Polysubstance abuse (HCC)    History of taking non-prescribed opiates, BZDs; also +cocaine and THC   Ruptured disc, thoracic    Suicide attempt (HCC) 2002   Past Surgical History:  Past Surgical History:  Procedure Laterality Date   CESAREAN SECTION     CLAVICLE SURGERY     REVERSE SHOULDER ARTHROPLASTY Left 01/01/2020   Procedure: LEFT REVERSE SHOULDER ARTHROPLASTY;  Surgeon: Jasmine Mesi, MD;  Location: [facility] OR;  Service: Orthopedics;  Laterality: Left;   TUBAL LIGATION     HPI:  Casey [name] is a 63 y.o. female with medical history significant of COPD, depression, anxiety, chronic back pain, polysubstance abuse who presented to the ED due to SOB.  Patient was unable to provide more detailed history due to being on BiPAP, most of the history was obtained from ED physician and ED medical record.  Per report, patient presents with increasing shortness of breath due to COPD and activated EMS, but was not going to go to the hospital after already arrival of EMS team, but was convinced due to being in respiratory distress and O2 sats being 84% on room air.  She was treated with Atrovent , albuterol  and magnesium  en route, she declined Solu-Medrol .  She requested pain meds for generalized body aches.  Patient was admitted from 5/23 to 5/27 due to Acute hypoxic respiratory failure.  She was also admitted from 5/20 to 5/22 due to same condition -acute on chronic hypoxemic  respiratory failure secondary to recurrent COPD exacerbation ; pt known to ST discipline; FEES completed 07/05/23 with nectar-thick liquids and Regular consistency recommended; subsequent MBS on 07/12/23 with recs for Regular/thin liquids.  ST consulted for swallow evaluation.    Assessment / Plan / Recommendation  Clinical Impression  Pt seen for clinical swallow evaluation, although limited d/t pt anxiety and decreased respiratory status requiring Bipap prior to assessment.  Pt desaturated during assessment into 80's with lowest point prior to placing her back on Bipap being 82 with RR rising to 38.  Discussed breathing/swallowing reciprocity and need for small volumes of liquids/bites d/t decreased respiratory status/dyspnea.  Delayed cough noted after intake of solids, but question oxygen requirement vs aspiration.  Hypophonic vocal quality noted during conversation within short phrases with shallow, rapid breathing pattern observed.  Recommend continuing Mechanical soft diet with thin liquids utilizing small volume of liquids/bites of solids as pt able to nutritionally support self between need for Bipap or HFNC d/t COPD exacerbation.  ST will f/u in acute setting for diet tolerance/dysphagia management/tx.  Thank you for this consult. SLP Visit Diagnosis: Dysphagia, unspecified (R13.10)    Aspiration Risk  Mild aspiration risk;Risk for inadequate nutrition/hydration    Diet Recommendation   Thin;Dysphagia 3 (mechanical soft) (pt preferred in small volumes)  Medication Administration: Whole meds with liquid (or puree prn)    Other  Recommendations Oral Care Recommendations: Oral care BID     Assistance Recommended at Discharge  TBD  Functional Status Assessment Patient has had a recent decline in their functional status and demonstrates the ability to make significant improvements in function in a reasonable and predictable amount of time.  Frequency and Duration min 2x/week  1 week        Prognosis Prognosis for improved oropharyngeal function: Good Barriers to Reach Goals: Severity of deficits      Swallow Study   General Date of Onset: 04/20/24 HPI: Casey [name] is a 63 y.o. female with medical history significant of COPD, depression, anxiety, chronic back pain, polysubstance abuse who presented to the ED due to SOB.  Patient was unable to provide more detailed history due to being on BiPAP, most of the history was obtained from ED physician and ED medical record.  Per report, patient presents with increasing shortness of breath due to COPD and activated EMS, but was not going to go to the hospital after already arrival of EMS team, but was convinced due to being in respiratory distress and O2 sats being 84% on room air.  She was treated with Atrovent , albuterol  and magnesium  en route, she declined Solu-Medrol .  She requested pain meds for generalized body aches.  Patient was admitted from 5/23 to 5/27 due to Acute hypoxic respiratory failure.  She was also admitted from 5/20 to 5/22 due to same condition -acute on chronic hypoxemic respiratory failure secondary to recurrent COPD exacerbation ; pt known to ST discipline; FEES completed 07/05/23 with nectar-thick liquids and Regular consistency recommended; subsequent MBS on 07/12/23 with recs for Regular/thin liquids.  ST consulted for swallow evaluation. Type of Study: Bedside Swallow Evaluation Previous Swallow Assessment: FEES 07/05/23 with NTL/Regular recs; MBS 07/12/23 with Regular/thin liquid recs. Diet Prior to this Study: Dysphagia 3 (mechanical soft);Thin liquids (Level 0) Temperature Spikes Noted: No Respiratory Status: Other (comment) (Bipap) History of Recent Intubation: No Behavior/Cognition: Alert;Cooperative;Distractible;Other (Comment) (anxious) Oral Cavity Assessment: Within Functional Limits;Dry Oral Care Completed by SLP: Recent completion by staff Oral Cavity - Dentition: Missing dentition Vision: Functional  for self-feeding Self-Feeding Abilities: Able to feed self Patient Positioning: Upright in bed;Other (comment) (min kyphotic) Baseline Vocal Quality: Low vocal intensity Volitional Cough: Weak Volitional Swallow: Able to elicit    Oral/Motor/Sensory Function Overall Oral Motor/Sensory Function: Within functional limits   Ice Chips Ice chips: Within functional limits Presentation: Spoon;Self Fed   Thin Liquid Thin Liquid: Impaired Presentation: Self Fed;Cup Pharyngeal  Phase Impairments: Other (comments) (only small sips assessed d/t dyspnea)    Nectar Thick Nectar Thick Liquid: Not tested   Honey Thick Honey Thick Liquid: Not tested   Puree Puree: Not tested   Solid     Solid: Impaired Presentation: Self Fed Pharyngeal Phase Impairments: Cough - Delayed Other Comments: Small bites only assessed d/t dyspnea      Pat Lilas Diefendorf,M.S.,CCC-SLP 04/21/2024,11:14 AM

## 2024-04-22 DIAGNOSIS — J441 Chronic obstructive pulmonary disease with (acute) exacerbation: Secondary | ICD-10-CM | POA: Diagnosis not present

## 2024-04-22 LAB — CBC
HCT: 33.6 % — ABNORMAL LOW (ref 36.0–46.0)
Hemoglobin: 10.5 g/dL — ABNORMAL LOW (ref 12.0–15.0)
MCH: 28.8 pg (ref 26.0–34.0)
MCHC: 31.3 g/dL (ref 30.0–36.0)
MCV: 92.1 fL (ref 80.0–100.0)
Platelets: 369 10*3/uL (ref 150–400)
RBC: 3.65 MIL/uL — ABNORMAL LOW (ref 3.87–5.11)
RDW: 13.2 % (ref 11.5–15.5)
WBC: 12.9 10*3/uL — ABNORMAL HIGH (ref 4.0–10.5)
nRBC: 0 % (ref 0.0–0.2)

## 2024-04-22 LAB — GLUCOSE, CAPILLARY
Glucose-Capillary: 110 mg/dL — ABNORMAL HIGH (ref 70–99)
Glucose-Capillary: 145 mg/dL — ABNORMAL HIGH (ref 70–99)
Glucose-Capillary: 178 mg/dL — ABNORMAL HIGH (ref 70–99)
Glucose-Capillary: 184 mg/dL — ABNORMAL HIGH (ref 70–99)

## 2024-04-22 LAB — BASIC METABOLIC PANEL WITH GFR
Anion gap: 8 (ref 5–15)
BUN: 16 mg/dL (ref 8–23)
CO2: 34 mmol/L — ABNORMAL HIGH (ref 22–32)
Calcium: 9.1 mg/dL (ref 8.9–10.3)
Chloride: 95 mmol/L — ABNORMAL LOW (ref 98–111)
Creatinine, Ser: 0.44 mg/dL (ref 0.44–1.00)
GFR, Estimated: >60 mL/min (ref >60)
Glucose, Bld: 112 mg/dL — ABNORMAL HIGH (ref 70–99)
Potassium: 4.1 mmol/L (ref 3.5–5.1)
Sodium: 137 mmol/L (ref 135–145)

## 2024-04-22 LAB — MAGNESIUM: Magnesium: 1.9 mg/dL (ref 1.7–2.4)

## 2024-04-22 NOTE — Plan of Care (Signed)
  Problem: Education: Goal: Ability to describe self-care measures that may prevent or decrease complications (Diabetes Survival Skills Education) will improve Outcome: Progressing   Problem: Education: Goal: Knowledge of General Education information will improve Description: Including pain rating scale, medication(s)/side effects and non-pharmacologic comfort measures Outcome: Progressing   Problem: Safety: Goal: Ability to remain free from injury will improve Outcome: Progressing   Problem: Skin Integrity: Goal: Risk for impaired skin integrity will decrease Outcome: Progressing

## 2024-04-22 NOTE — Progress Notes (Signed)
 PROGRESS NOTE    AJANAE VIRAG  JYN:[id number] DOB: Jun 04, 1961 DOA: 04/20/2024 PCP: Zarwolo, Gloria, FNP   Brief Narrative:    Casey [name] is a 63 y.o. female with medical history significant of COPD with chronic hypoxemia on 3 L nasal cannula at home, depression, anxiety, chronic back pain, polysubstance abuse who presents to the emergency department due to shortness of breath.  She has been readmitted with acute on chronic hypoxemic respiratory failure secondary to COPD exacerbation.  She is also noted to have significant symptoms of anxiety.  Assessment & Plan:   Principal Problem:   Acute exacerbation of chronic obstructive pulmonary disease (COPD) (HCC) Active Problems:   Tobacco abuse   Acute and chronic respiratory failure with hypoxia (HCC)   Hypokalemia   Hyperglycemia   Leukocytosis   Anxiety  Assessment and Plan:   Acute exacerbation of COPD-improving Acute on chronic respiratory failure with hypoxia requiring NIPPV Patient wears 3 L nasal cannula at home, wean from 7 L nasal cannula Continue Xopenex , Atrovent , Mucinex , Decadron , azithromycin . Continue Protonix  to prevent steroid-induced ulcer Continue incentive spirometry and flutter valve Continue BiPAP with plan to transition to supplemental oxygen to maintain O2 sat > 94% with eventual plan to wean patient off oxygen as tolerated    Hyperglycemia-improved This may be steroid-induced considering several recent admissions due to COPD exacerbation Continue ISS and hypoglycemia protocol   Leukocytosis-improving This is possibly reactive due to having been on steroid A1c 5.9% No obvious sign of any acute infectious process at this time Continue to monitor WBC with morning labs.   Anxiety Continue Celexa    Tobacco abuse Patient was counseled on tobacco abuse cessation Continue nicotine  patch   DVT prophylaxis:Lovenox  Code Status: Full Family Communication: None at bedside Disposition Plan:  Status  is: Inpatient Remains inpatient appropriate because: Need for IV medications.   Consultants:  None  Procedures:  None  Antimicrobials:  Anti-infectives (From admission, onward)    Start     Dose/Rate Route Frequency Ordered Stop   04/21/24 1000  azithromycin  (ZITHROMAX ) tablet 250 mg       Placed in Followed by Linked Group   250 mg Oral Daily 04/20/24 0735 04/25/24 0959   04/20/24 1000  azithromycin  (ZITHROMAX ) tablet 500 mg       Placed in Followed by Linked Group   500 mg Oral Daily 04/20/24 0735 04/20/24 0906       Subjective: Patient seen and evaluated today with improvement in shortness of breath and anxiety levels today.  Currently on 7 L nasal cannula.  Objective: Vitals:   04/22/24 0800 04/22/24 0837 04/22/24 0900 04/22/24 1000  BP: (!) 144/90  (!) 144/89 130/67  Pulse: 97  85 85  Resp: 16  17 (!) 24  Temp:      TempSrc:      SpO2: 96% 99% 96% 96%  Weight:      Height:        Intake/Output Summary (Last 24 hours) at 04/22/2024 1102 Last data filed at 04/22/2024 0901 Gross per 24 hour  Intake 240 ml  Output 2400 ml  Net -2160 ml   Filed Weights   04/20/24 0352 04/20/24 1158  Weight: 53.5 kg 55.9 kg    Examination:  General exam: Appears calm and comfortable  Respiratory system: Diminished and minimal wheezing bilaterally.  7 L nasal cannula. Cardiovascular system: S1 & S2 heard, RRR.  Gastrointestinal system: Abdomen is soft Central nervous system: Alert and awake Extremities: No edema Skin: No  significant lesions noted Psychiatry: Flat affect.    Data Reviewed: I have personally reviewed following labs and imaging studies  CBC: Recent Labs  Lab 04/20/24 0334 04/21/24 0427 04/22/24 0317  WBC 16.9* 12.4* 12.9*  NEUTROABS 10.0*  --   --   HGB 10.8* 10.1* 10.5*  HCT 33.9* 32.9* 33.6*  MCV 91.9 91.9 92.1  PLT 337 316 369   Basic Metabolic Panel: Recent Labs  Lab 04/20/24 0334 04/21/24 0427 04/22/24 0317  NA 138 140 137  K  3.1* 4.0 4.1  CL 105 102 95*  CO2 25 30 34*  GLUCOSE 270* 125* 112*  BUN 13 11 16   CREATININE 0.55 0.48 0.44  CALCIUM  7.7* 8.7* 9.1  MG 3.6* 2.0 1.9  PHOS 2.6  --   --    GFR: Estimated Creatinine Clearance: 61.9 mL/min (by C-G formula based on SCr of 0.44 mg/dL). Liver Function Tests: Recent Labs  Lab 04/21/24 0427  AST 8*  ALT 12  ALKPHOS 65  BILITOT 0.3  PROT 5.9*  ALBUMIN 3.0*   No results for input(s): LIPASE, AMYLASE in the last 168 hours. No results for input(s): AMMONIA in the last 168 hours. Coagulation Profile: No results for input(s): INR, PROTIME in the last 168 hours. Cardiac Enzymes: No results for input(s): CKTOTAL, CKMB, CKMBINDEX, TROPONINI in the last 168 hours. BNP (last 3 results) No results for input(s): PROBNP in the last 8760 hours. HbA1C: Recent Labs    04/20/24 0334  HGBA1C 5.9*   CBG: Recent Labs  Lab 04/21/24 0811 04/21/24 1133 04/21/24 1623 04/21/24 2157 04/22/24 0753  GLUCAP 137* 142* 191* 153* 110*   Lipid Profile: No results for input(s): CHOL, HDL, LDLCALC, TRIG, CHOLHDL, LDLDIRECT in the last 72 hours. Thyroid  Function Tests: No results for input(s): TSH, T4TOTAL, FREET4, T3FREE, THYROIDAB in the last 72 hours. Anemia Panel: No results for input(s): VITAMINB12, FOLATE, FERRITIN, TIBC, IRON, RETICCTPCT in the last 72 hours. Sepsis Labs: No results for input(s): PROCALCITON, LATICACIDVEN in the last 168 hours.  Recent Results (from the past 240 hours)  MRSA Next Gen by PCR, Nasal     Status: None   Collection Time: 04/20/24 12:00 PM   Specimen: Nasal Mucosa; Nasal Swab  Result Value Ref Range Status   MRSA by PCR Next Gen NOT DETECTED NOT DETECTED Final    Comment: (NOTE) The GeneXpert MRSA Assay (FDA approved for NASAL specimens only), is one component of a comprehensive MRSA colonization surveillance program. It is not intended to diagnose MRSA infection nor to  guide or monitor treatment for MRSA infections. Test performance is not FDA approved in patients less than 73 years old. Performed at [facility], [address], [place], [place] [address]          Radiology Studies: DG CHEST PORT 1 VIEW Result Date: 04/21/2024 CLINICAL DATA:  Shortness of breath. EXAM: PORTABLE CHEST 1 VIEW COMPARISON:  04/20/2024. FINDINGS: Stable cardiomediastinal silhouette. Minimal left basilar atelectasis/scarring. No focal consolidation, sizeable pleural effusion, or pneumothorax. Multiple remote left-sided rib fractures. Partially evaluated left reverse total shoulder arthroplasty. IMPRESSION: Minimal left basilar atelectasis/scarring. Otherwise, no acute cardiopulmonary findings. Electronically Signed   By: Mannie Seek M.D.   On: 04/21/2024 14:25        Scheduled Meds:  azithromycin   250 mg Oral Daily   budesonide  (PULMICORT ) nebulizer solution  0.25 mg Nebulization BID   Chlorhexidine  Gluconate Cloth  6 each Topical Q0600   citalopram   20 mg Oral Daily   dexamethasone  (DECADRON )  injection  10 mg Intravenous Q24H   dextromethorphan -guaiFENesin   1 tablet Oral BID   enoxaparin  (LOVENOX ) injection  40 mg Subcutaneous Q24H   feeding supplement  237 mL Oral TID BM   insulin  aspart  0-15 Units Subcutaneous TID WC   levalbuterol   0.63 mg Nebulization Q6H   nicotine   7 mg Transdermal Daily   pantoprazole   40 mg Oral Daily     LOS: 2 days    Time spent: 55 minutes    Maiah Sinning Loran Rock, DO [facility]  If 7PM-7AM, please contact night-coverage [email or web address] 04/22/2024, 11:02 AM

## 2024-04-22 NOTE — Plan of Care (Signed)

## 2024-04-23 DIAGNOSIS — J441 Chronic obstructive pulmonary disease with (acute) exacerbation: Secondary | ICD-10-CM | POA: Diagnosis not present

## 2024-04-23 LAB — BASIC METABOLIC PANEL WITH GFR
Anion gap: 7 (ref 5–15)
BUN: 15 mg/dL (ref 8–23)
CO2: 32 mmol/L (ref 22–32)
Calcium: 8.4 mg/dL — ABNORMAL LOW (ref 8.9–10.3)
Chloride: 96 mmol/L — ABNORMAL LOW (ref 98–111)
Creatinine, Ser: 0.47 mg/dL (ref 0.44–1.00)
GFR, Estimated: >60 mL/min (ref >60)
Glucose, Bld: 117 mg/dL — ABNORMAL HIGH (ref 70–99)
Potassium: 3.6 mmol/L (ref 3.5–5.1)
Sodium: 135 mmol/L (ref 135–145)

## 2024-04-23 LAB — CBC
HCT: 34.1 % — ABNORMAL LOW (ref 36.0–46.0)
Hemoglobin: 10.5 g/dL — ABNORMAL LOW (ref 12.0–15.0)
MCH: 28.2 pg (ref 26.0–34.0)
MCHC: 30.8 g/dL (ref 30.0–36.0)
MCV: 91.4 fL (ref 80.0–100.0)
Platelets: 395 10*3/uL (ref 150–400)
RBC: 3.73 MIL/uL — ABNORMAL LOW (ref 3.87–5.11)
RDW: 13.1 % (ref 11.5–15.5)
WBC: 12.6 10*3/uL — ABNORMAL HIGH (ref 4.0–10.5)
nRBC: 0 % (ref 0.0–0.2)

## 2024-04-23 LAB — GLUCOSE, CAPILLARY
Glucose-Capillary: 87 mg/dL (ref 70–99)
Glucose-Capillary: 171 mg/dL — ABNORMAL HIGH (ref 70–99)
Glucose-Capillary: 142 mg/dL — ABNORMAL HIGH (ref 70–99)
Glucose-Capillary: 120 mg/dL — ABNORMAL HIGH (ref 70–99)

## 2024-04-23 LAB — MAGNESIUM: Magnesium: 1.9 mg/dL (ref 1.7–2.4)

## 2024-04-23 MED ORDER — LEVALBUTEROL HCL 0.63 MG/3ML IN NEBU
0.6300 mg | INHALATION_SOLUTION | Freq: Four times a day (QID) | RESPIRATORY_TRACT | Status: DC
  Administered 2024-04-23 – 2024-04-24 (×5): 0.63 mg via RESPIRATORY_TRACT
  Filled 2024-04-23 (×5): qty 3

## 2024-04-23 NOTE — Progress Notes (Signed)
 PROGRESS NOTE    Casey [name]  ZOX:[id number] DOB: 25-Oct-1961 DOA: 04/20/2024 PCP: Zarwolo, Gloria, FNP   Brief Narrative:    Casey [name] is a 63 y.o. female with medical history significant of COPD with chronic hypoxemia on 3 L nasal cannula at home, depression, anxiety, chronic back pain, polysubstance abuse who presents to the emergency department due to shortness of breath.  She has been readmitted with acute on chronic hypoxemic respiratory failure secondary to COPD exacerbation.  She is also noted to have significant symptoms of anxiety.  Assessment & Plan:   Principal Problem:   Acute exacerbation of chronic obstructive pulmonary disease (COPD) (HCC) Active Problems:   Tobacco abuse   Acute and chronic respiratory failure with hypoxia (HCC)   Hypokalemia   Hyperglycemia   Leukocytosis   Anxiety  Assessment and Plan:   Acute exacerbation of COPD-improving Acute on chronic respiratory failure with hypoxia requiring NIPPV Patient wears 3 L nasal cannula at home, wean from 7 L nasal cannula Continue Xopenex , Atrovent , Mucinex , Decadron , azithromycin . Continue Protonix  to prevent steroid-induced ulcer Continue incentive spirometry and flutter valve Continue BiPAP with plan to transition to supplemental oxygen to maintain O2 sat > 94% with eventual plan to wean patient off oxygen as tolerated    Hyperglycemia-improved This may be steroid-induced considering several recent admissions due to COPD exacerbation Continue ISS and hypoglycemia protocol   Leukocytosis-improving This is possibly reactive due to having been on steroid A1c 5.9% No obvious sign of any acute infectious process at this time Continue to monitor WBC with morning labs.   Anxiety Continue Celexa    Tobacco abuse Patient was counseled on tobacco abuse cessation Continue nicotine  patch   DVT prophylaxis:Lovenox  Code Status: Full Family Communication: None at bedside Disposition Plan:  Status  is: Inpatient Remains inpatient appropriate because: Need for IV medications.   Consultants:  None  Procedures:  None  Antimicrobials:  Anti-infectives (From admission, onward)    Start     Dose/Rate Route Frequency Ordered Stop   04/21/24 1000  azithromycin  (ZITHROMAX ) tablet 250 mg       Placed in Followed by Linked Group   250 mg Oral Daily 04/20/24 0735 04/25/24 0959   04/20/24 1000  azithromycin  (ZITHROMAX ) tablet 500 mg       Placed in Followed by Linked Group   500 mg Oral Daily 04/20/24 0735 04/20/24 0906       Subjective: Patient seen and evaluated today with improvement in shortness of breath and anxiety levels today.  Still on 7 L nasal cannula which needs to be weaned further.  Patient still continues to have cough and congestion.  Objective: Vitals:   04/22/24 1927 04/22/24 2048 04/23/24 0100 04/23/24 0904  BP:  124/72 123/73   Pulse:  91 80   Resp:  18 19   Temp:  (!) 97.1 F (36.2 C) 97.8 F (36.6 C)   TempSrc:  Oral Oral   SpO2: 98% 98% 98% 92%  Weight:      Height:        Intake/Output Summary (Last 24 hours) at 04/23/2024 1056 Last data filed at 04/22/2024 2012 Gross per 24 hour  Intake 240 ml  Output 700 ml  Net -460 ml   Filed Weights   04/20/24 0352 04/20/24 1158  Weight: 53.5 kg 55.9 kg    Examination:  General exam: Appears calm and comfortable  Respiratory system: Diminished and minimal wheezing bilaterally.  7 L nasal cannula. Cardiovascular system: S1 & S2  heard, RRR.  Gastrointestinal system: Abdomen is soft Central nervous system: Alert and awake Extremities: No edema Skin: No significant lesions noted Psychiatry: Flat affect.    Data Reviewed: I have personally reviewed following labs and imaging studies  CBC: Recent Labs  Lab 04/20/24 0334 04/21/24 0427 04/22/24 0317 04/23/24 0245  WBC 16.9* 12.4* 12.9* 12.6*  NEUTROABS 10.0*  --   --   --   HGB 10.8* 10.1* 10.5* 10.5*  HCT 33.9* 32.9* 33.6* 34.1*  MCV  91.9 91.9 92.1 91.4  PLT 337 316 369 395   Basic Metabolic Panel: Recent Labs  Lab 04/20/24 0334 04/21/24 0427 04/22/24 0317 04/23/24 0245  NA 138 140 137 135  K 3.1* 4.0 4.1 3.6  CL 105 102 95* 96*  CO2 25 30 34* 32  GLUCOSE 270* 125* 112* 117*  BUN 13 11 16 15   CREATININE 0.55 0.48 0.44 0.47  CALCIUM  7.7* 8.7* 9.1 8.4*  MG 3.6* 2.0 1.9 1.9  PHOS 2.6  --   --   --    GFR: Estimated Creatinine Clearance: 61.9 mL/min (by C-G formula based on SCr of 0.47 mg/dL). Liver Function Tests: Recent Labs  Lab 04/21/24 0427  AST 8*  ALT 12  ALKPHOS 65  BILITOT 0.3  PROT 5.9*  ALBUMIN 3.0*   No results for input(s): LIPASE, AMYLASE in the last 168 hours. No results for input(s): AMMONIA in the last 168 hours. Coagulation Profile: No results for input(s): INR, PROTIME in the last 168 hours. Cardiac Enzymes: No results for input(s): CKTOTAL, CKMB, CKMBINDEX, TROPONINI in the last 168 hours. BNP (last 3 results) No results for input(s): PROBNP in the last 8760 hours. HbA1C: No results for input(s): HGBA1C in the last 72 hours.  CBG: Recent Labs  Lab 04/22/24 0753 04/22/24 1128 04/22/24 1639 04/22/24 2051 04/23/24 0748  GLUCAP 110* 145* 178* 184* 87   Lipid Profile: No results for input(s): CHOL, HDL, LDLCALC, TRIG, CHOLHDL, LDLDIRECT in the last 72 hours. Thyroid  Function Tests: No results for input(s): TSH, T4TOTAL, FREET4, T3FREE, THYROIDAB in the last 72 hours. Anemia Panel: No results for input(s): VITAMINB12, FOLATE, FERRITIN, TIBC, IRON, RETICCTPCT in the last 72 hours. Sepsis Labs: No results for input(s): PROCALCITON, LATICACIDVEN in the last 168 hours.  Recent Results (from the past 240 hours)  MRSA Next Gen by PCR, Nasal     Status: None   Collection Time: 04/20/24 12:00 PM   Specimen: Nasal Mucosa; Nasal Swab  Result Value Ref Range Status   MRSA by PCR Next Gen NOT DETECTED NOT DETECTED Final     Comment: (NOTE) The GeneXpert MRSA Assay (FDA approved for NASAL specimens only), is one component of a comprehensive MRSA colonization surveillance program. It is not intended to diagnose MRSA infection nor to guide or monitor treatment for MRSA infections. Test performance is not FDA approved in patients less than 74 years old. Performed at [facility], [address], [place], [place] [address]          Radiology Studies: No results found.       Scheduled Meds:  azithromycin   250 mg Oral Daily   budesonide  (PULMICORT ) nebulizer solution  0.25 mg Nebulization BID   Chlorhexidine  Gluconate Cloth  6 each Topical Q0600   citalopram   20 mg Oral Daily   dexamethasone  (DECADRON ) injection  10 mg Intravenous Q24H   dextromethorphan -guaiFENesin   1 tablet Oral BID   enoxaparin  (LOVENOX ) injection  40 mg Subcutaneous Q24H   feeding supplement  237  mL Oral TID BM   insulin  aspart  0-15 Units Subcutaneous TID WC   levalbuterol   0.63 mg Nebulization Q6H WA   nicotine   7 mg Transdermal Daily   pantoprazole   40 mg Oral Daily     LOS: 3 days    Time spent: 55 minutes    Ayodele Sangalang Loran Rock, DO [facility]  If 7PM-7AM, please contact night-coverage [email or web address] 04/23/2024, 10:56 AM

## 2024-04-23 NOTE — Progress Notes (Signed)
 Speech Language Pathology Treatment: Dysphagia  Patient Details Name: Casey [name] MRN: [id number] DOB: 25-Aug-1961 Today's Date: 04/23/2024 Time: 4098-1191 SLP Time Calculation (min) (ACUTE ONLY): 14 min  Assessment / Plan / Recommendation Clinical Impression  Pt seen at bedside for ST targeting dysphagia management. Pt has breakfast tray, denies issues with consumption. Pt was able to teach back recommendation for slow rate as swallow precaution. SLP reviewed all recommendations and rationale. Following, pt demonstrates understanding via teach back. She then utilized slow rate, small bites, single sips for consumption of mechanical soft solids and thin liquids. No overt s/sx aspiration noted. She maintained her respiratory rate and no changes in voice quality were noted. SLP review aspiration precautions, with pt demonstrating understanding via teach back. Pt consumed 90% of morning meal tray. Will continue to follow per POC.    HPI HPI: Casey [name] is a 63 y.o. female with medical history significant of COPD, depression, anxiety, chronic back pain, polysubstance abuse who presented to the ED due to SOB.  Patient was unable to provide more detailed history due to being on BiPAP, most of the history was obtained from ED physician and ED medical record.  Per report, patient presents with increasing shortness of breath due to COPD and activated EMS, but was not going to go to the hospital after already arrival of EMS team, but was convinced due to being in respiratory distress and O2 sats being 84% on room air.  She was treated with Atrovent , albuterol  and magnesium  en route, she declined Solu-Medrol .  She requested pain meds for generalized body aches.  Patient was admitted from 5/23 to 5/27 due to Acute hypoxic respiratory failure.  She was also admitted from 5/20 to 5/22 due to same condition -acute on chronic hypoxemic respiratory failure secondary to recurrent COPD exacerbation ; pt known to ST  discipline; FEES completed 07/05/23 with nectar-thick liquids and Regular consistency recommended; subsequent MBS on 07/12/23 with recs for Regular/thin liquids.  ST consulted for swallow evaluation.      SLP Plan  Continue with current plan of care          Recommendations  Diet recommendations: Dysphagia 3 (mechanical soft);Thin liquid Medication Administration: Whole meds with liquid (or puree PRN) Supervision: Patient able to self feed Compensations: Minimize environmental distractions;Small sips/bites;Slow rate        Oral care BID     Dysphagia, unspecified (R13.10)     Continue with current plan of care     [name]  04/23/2024, 10:11 AM

## 2024-04-23 NOTE — Plan of Care (Signed)

## 2024-04-24 ENCOUNTER — Other Ambulatory Visit: Payer: Self-pay | Admitting: Family Medicine

## 2024-04-24 ENCOUNTER — Ambulatory Visit: Payer: MEDICAID | Admitting: Family Medicine

## 2024-04-24 DIAGNOSIS — J441 Chronic obstructive pulmonary disease with (acute) exacerbation: Secondary | ICD-10-CM | POA: Diagnosis not present

## 2024-04-24 LAB — CBC
HCT: 36.8 % (ref 36.0–46.0)
Hemoglobin: 11.4 g/dL — ABNORMAL LOW (ref 12.0–15.0)
MCH: 28.1 pg (ref 26.0–34.0)
MCHC: 31 g/dL (ref 30.0–36.0)
MCV: 90.9 fL (ref 80.0–100.0)
Platelets: 403 10*3/uL — ABNORMAL HIGH (ref 150–400)
RBC: 4.05 MIL/uL (ref 3.87–5.11)
RDW: 13.1 % (ref 11.5–15.5)
WBC: 12.4 10*3/uL — ABNORMAL HIGH (ref 4.0–10.5)
nRBC: 0 % (ref 0.0–0.2)

## 2024-04-24 LAB — GLUCOSE, CAPILLARY
Glucose-Capillary: 86 mg/dL (ref 70–99)
Glucose-Capillary: 93 mg/dL (ref 70–99)

## 2024-04-24 LAB — BASIC METABOLIC PANEL WITH GFR
Anion gap: 8 (ref 5–15)
BUN: 16 mg/dL (ref 8–23)
CO2: 30 mmol/L (ref 22–32)
Calcium: 8.4 mg/dL — ABNORMAL LOW (ref 8.9–10.3)
Chloride: 98 mmol/L (ref 98–111)
Creatinine, Ser: 0.43 mg/dL — ABNORMAL LOW (ref 0.44–1.00)
GFR, Estimated: >60 mL/min (ref >60)
Glucose, Bld: 91 mg/dL (ref 70–99)
Potassium: 4.1 mmol/L (ref 3.5–5.1)
Sodium: 136 mmol/L (ref 135–145)

## 2024-04-24 LAB — MAGNESIUM: Magnesium: 2 mg/dL (ref 1.7–2.4)

## 2024-04-24 MED ORDER — DM-GUAIFENESIN ER 30-600 MG PO TB12: 1.0000 | ORAL_TABLET | Freq: Two times a day (BID) | ORAL | 0 refills | Status: AC

## 2024-04-24 MED ORDER — PREDNISONE 10 MG PO TABS: 40.0000 mg | ORAL_TABLET | Freq: Every day | ORAL | 0 refills | Status: AC

## 2024-04-24 MED ORDER — OXYCODONE-ACETAMINOPHEN 5-325 MG PO TABS: 1.0000 | ORAL_TABLET | Freq: Four times a day (QID) | ORAL | 0 refills | Status: DC | PRN

## 2024-04-24 MED ORDER — ALPRAZOLAM 1 MG PO TABS
1.0000 mg | ORAL_TABLET | Freq: Three times a day (TID) | ORAL | 0 refills | Status: DC | PRN
Start: 2024-04-24 — End: 2024-05-11

## 2024-04-24 MED ORDER — PANTOPRAZOLE SODIUM 40 MG PO TBEC: 40.0000 mg | DELAYED_RELEASE_TABLET | Freq: Every day | ORAL | 0 refills | Status: DC

## 2024-04-24 NOTE — Telephone Encounter (Signed)
 Patient is calling to receive her drug results so that she is able to receive her ALPRAZolam  (XANAX ) 1 MG tablet [[id number]]

## 2024-04-24 NOTE — Progress Notes (Signed)
 Speech Language Pathology Treatment: Dysphagia  Patient Details Name: Casey [name] MRN: [id number] DOB: August 17, 1961 Today's Date: 04/24/2024 Time: 4098-1191 SLP Time Calculation (min) (ACUTE ONLY): 9 min  Assessment / Plan / Recommendation Clinical Impression  Pt seen for ST session targeting education of swallow precautions prior to d/c home from [facility]. Pt denied pain. Pt IND recalled to use slow rate from previous ST session. SLP provided re-education on the following aspiration precautions: slow rate, small bites/sips, remain upright for PO. Provided education on link between COPD and dysphagia and pt's increased risk for aspiration PNA. Recommended pt use swallow precautions at home but also complete thorough oral care prior to PO during COPD exacerbations. Pt verbalized understanding. Discussed that pt may benefit from outpatient ST to target respiratory muscle strength training. Pt is not interested in additional ST at this time but states she would consider it in the future. Rec continue Dys 3/thin with swallow precautions. ST to sign off as pt has no further ST needs at this level of care.     HPI HPI: Casey [name] is a 63 y.o. female with medical history significant of COPD, depression, anxiety, chronic back pain, polysubstance abuse who presented to the ED due to SOB.  Patient was unable to provide more detailed history due to being on BiPAP, most of the history was obtained from ED physician and ED medical record.  Per report, patient presents with increasing shortness of breath due to COPD and activated EMS, but was not going to go to the hospital after already arrival of EMS team, but was convinced due to being in respiratory distress and O2 sats being 84% on room air.  She was treated with Atrovent , albuterol  and magnesium  en route, she declined Solu-Medrol .  She requested pain meds for generalized body aches.  Patient was admitted from 5/23 to 5/27 due to Acute hypoxic respiratory failure.   She was also admitted from 5/20 to 5/22 due to same condition -acute on chronic hypoxemic respiratory failure secondary to recurrent COPD exacerbation ; pt known to ST discipline; FEES completed 07/05/23 with nectar-thick liquids and Regular consistency recommended; subsequent MBS on 07/12/23 with recs for Regular/thin liquids.  ST consulted for swallow evaluation.      SLP Plan  Discharge SLP treatment due to (comment)          Recommendations  Diet recommendations: Dysphagia 3 (mechanical soft);Thin liquid Liquids provided via: Cup;Straw Medication Administration: Whole meds with liquid Supervision: Patient able to self feed Compensations: Slow rate;Small sips/bites;Minimize environmental distractions Postural Changes and/or Swallow Maneuvers: Seated upright 90 degrees;Upright 30-60 min after meal                  Oral care BID   None Dysphagia, unspecified (R13.10)     Discharge SLP treatment due to (comment)     Casey Chapel, MA CCC-SLP  04/24/2024, 1:28 PM

## 2024-04-24 NOTE — Progress Notes (Signed)
 Mobility Specialist Progress Note:    04/24/24 0905  Mobility  Activity Ambulated with assistance in hallway  Level of Assistance Standby assist, set-up cues, supervision of patient - no hands on  Assistive Device None  Distance Ambulated (ft) 55 ft  Range of Motion/Exercises Active;All extremities  Activity Response Tolerated well  Mobility Referral Yes  Mobility visit 1 Mobility  Mobility Specialist Start Time (ACUTE ONLY) [id number]  Mobility Specialist Stop Time (ACUTE ONLY) [id number]  Mobility Specialist Time Calculation (min) (ACUTE ONLY) 22 min   Pt received in bed, agreeable to mobility. Required SBA to stand and ambulate with no AD. Tolerated well, SpO2 90% on 3L during ambulation. SpO2 desat to 83% on 3L after ambulation, requiring 6L to bring pts sats back up to 90%. Notified RN. All needs met.    Glinda Lapping Mobility Specialist Please contact via Special educational needs teacher or  Rehab office at [phone]

## 2024-04-24 NOTE — Progress Notes (Signed)
 Patient medications returned via pharmacy

## 2024-04-24 NOTE — Discharge Summary (Signed)
 Physician Discharge Summary  MAKENZEE CHOUDHRY WRU:[id number] DOB: [birth year] DOA: 04/20/2024  PCP: Zarwolo, Gloria, FNP  Admit date: 04/20/2024  Discharge date: 04/24/2024  Admitted From:Home  Disposition:  Home  Recommendations for Outpatient Follow-up:  Follow up with PCP in 1-2 weeks Continue on prednisone  as prescribed for 5 more days to complete course of treatment Use home inhalers as needed for shortness of breath or wheezing Prescribed Xanax  and Percocet for 10 tablets each and 0 refills, patient will need to follow-up with pain clinic for further refills as well as PCP Continue other home medications as prior  Home Health: None  Equipment/Devices:  Discharge Condition:Stable  CODE STATUS: Full  Diet recommendation: Heart Healthy  Brief/Interim Summary: Casey [name] is a 63 y.o. female with medical history significant of COPD with chronic hypoxemia on 3 L nasal cannula at home, depression, anxiety, chronic back pain, polysubstance abuse who presents to the emergency department due to shortness of breath.  She has been readmitted with acute on chronic hypoxemic respiratory failure secondary to COPD exacerbation.  She is also noted to have significant symptoms of anxiety in general and related to her respiratory status.  She is now in stable condition for discharge with improved respiratory symptoms and is back to her baseline 3 L nasal cannula oxygen.  She has been prescribed refills on her Xanax  and has been given some pain medications as needed.  She continues to remain a high risk for readmission.  Discharge Diagnoses:  Principal Problem:   Acute exacerbation of chronic obstructive pulmonary disease (COPD) (HCC) Active Problems:   Tobacco abuse   Acute and chronic respiratory failure with hypoxia (HCC)   Hypokalemia   Hyperglycemia   Leukocytosis   Anxiety  Principal discharge diagnosis: Acute on chronic hypoxemic respiratory failure secondary to acute COPD  exacerbation with exacerbation of anxiety.  Discharge Instructions  Discharge Instructions     Diet - low sodium heart healthy   Complete by: As directed    Increase activity slowly   Complete by: As directed       Allergies as of 04/24/2024       Reactions   Methylprednisolone  Anxiety        Medication List     TAKE these medications    acetaminophen  325 MG tablet Commonly known as: TYLENOL  Take 2 tablets (650 mg total) by mouth every 6 (six) hours as needed for mild pain (or Fever >/= 101).   albuterol  108 (90 Base) MCG/ACT inhaler Commonly known as: VENTOLIN  HFA Inhale 2 puffs into the lungs every 4 (four) hours as needed for wheezing or shortness of breath.   ALPRAZolam  1 MG tablet Commonly known as: XANAX  Take 1 tablet (1 mg total) by mouth 3 (three) times daily as needed for anxiety. What changed:  when to take this reasons to take this   budesonide  0.25 MG/2ML nebulizer solution Commonly known as: PULMICORT  Take 2 mLs (0.25 mg total) by nebulization 2 (two) times daily.   citalopram  20 MG tablet Commonly known as: CELEXA  Take 1 tablet (20 mg total) by mouth daily.   dextromethorphan -guaiFENesin  30-600 MG 12hr tablet Commonly known as: MUCINEX  DM Take 1 tablet by mouth 2 (two) times daily for 5 days.   feeding supplement Liqd Take 237 mLs by mouth 3 (three) times daily between meals.   guaiFENesin  100 MG/5ML liquid Commonly known as: ROBITUSSIN Take 10 mLs by mouth every 4 (four) hours as needed for cough or to loosen phlegm.   hydrOXYzine   50 MG tablet Commonly known as: ATARAX  Take 50 mg by mouth 3 (three) times daily as needed.   ibuprofen  200 MG tablet Commonly known as: ADVIL  Take 200 mg by mouth every 6 (six) hours as needed for moderate pain (pain score 4-6).   ipratropium-albuterol  0.5-2.5 (3) MG/3ML Soln Commonly known as: DUONEB Four times daily   methocarbamol  500 MG tablet Commonly known as: ROBAXIN  Take 500 mg by mouth every 8  (eight) hours as needed for muscle spasms.   nicotine  7 mg/24hr patch Commonly known as: NICODERM CQ  - dosed in mg/24 hr Place 1 patch (7 mg total) onto the skin daily.   oxyCODONE -acetaminophen  5-325 MG tablet Commonly known as: PERCOCET/ROXICET Take 1-2 tablets by mouth every 6 (six) hours as needed for moderate pain (pain score 4-6) or severe pain (pain score 7-10).   OXYGEN Inhale 3 L into the lungs continuous.   pantoprazole  40 MG tablet Commonly known as: PROTONIX  Take 1 tablet (40 mg total) by mouth daily for 7 days. Start taking on: April 25, 2024   predniSONE  10 MG tablet Commonly known as: DELTASONE  Take 4 tablets (40 mg total) by mouth daily for 5 days. What changed:  medication strength how much to take how to take this when to take this additional instructions        Follow-up Information     Zarwolo, Gloria, FNP. Schedule an appointment as soon as possible for a visit in 1 week(s).   Specialty: Family Medicine Contact information: [address] [place] [address] [phone]                Allergies  Allergen Reactions   Methylprednisolone  Anxiety    Consultations: None   Procedures/Studies: DG CHEST PORT 1 VIEW Result Date: 04/21/2024 CLINICAL DATA:  Shortness of breath. EXAM: PORTABLE CHEST 1 VIEW COMPARISON:  04/20/2024. FINDINGS: Stable cardiomediastinal silhouette. Minimal left basilar atelectasis/scarring. No focal consolidation, sizeable pleural effusion, or pneumothorax. Multiple remote left-sided rib fractures. Partially evaluated left reverse total shoulder arthroplasty. IMPRESSION: Minimal left basilar atelectasis/scarring. Otherwise, no acute cardiopulmonary findings. Electronically Signed   By: Mannie Seek M.D.   On: 04/21/2024 14:25   DG Chest Portable 1 View Result Date: 04/20/2024 CLINICAL DATA:  Shortness of breath EXAM: PORTABLE CHEST 1 VIEW COMPARISON:  03/31/2024 FINDINGS: Cardiac shadow is stable. The lungs  are well aerated bilaterally. No focal infiltrate or sizable effusion is seen. No bony abnormality is noted. Postsurgical changes in the left shoulder are noted. IMPRESSION: No active disease. Electronically Signed   By: Violeta Grey M.D.   On: 04/20/2024 03:43   CT Chest W Contrast Result Date: 03/31/2024 CLINICAL DATA:  Persistent cough and shortness of breath EXAM: CT CHEST WITH CONTRAST TECHNIQUE: Multidetector CT imaging of the chest was performed during intravenous contrast administration. RADIATION DOSE REDUCTION: This exam was performed according to the departmental dose-optimization program which includes automated exposure control, adjustment of the mA and/or kV according to patient size and/or use of iterative reconstruction technique. CONTRAST:  80mL OMNIPAQUE  IOHEXOL  300 MG/ML  SOLN COMPARISON:  Same day chest radiograph, CTA chest dated 07/17/2023 FINDINGS: Cardiovascular: Normal heart size. No significant pericardial fluid/thickening. Great vessels are normal in course and caliber. No central pulmonary emboli. Coronary artery calcifications and aortic atherosclerosis. Mediastinum/Nodes: Imaged thyroid  gland without nodules meeting criteria for imaging follow-up by size. Normal esophagus. No pathologically enlarged axillary, supraclavicular, mediastinal, or hilar lymph nodes. Lungs/Pleura: The central airways are patent. Continued decrease in conspicuity of patchy  ground-glass opacities, with minimal residual areas of interstitial thickening. Previously noted areas of focal consolidation are no longer seen. Mild diffuse peribronchial wall thickening. Scattered subsegmental mucous plugging. No pneumothorax. No pleural effusion. Upper abdomen: Unchanged 11 mm right adrenal nodule measures 33 HU (2:135). No left adrenal nodule. Musculoskeletal: New superior endplate compression fracture of a T3. Unchanged compression deformities of T11 and T12. Old healing sternal fracture. Partially imaged left  shoulder arthroplasty. Multiple old left rib fractures. IMPRESSION: 1. Continued decrease in conspicuity of patchy bilateral ground-glass opacities, with minimal residual areas of interstitial thickening. Previously noted areas of focal consolidation are no longer seen. Findings likely reflect sequela of prior infection/inflammation. 2. Mild diffuse peribronchial wall thickening and scattered subsegmental mucous plugging, which can be seen in the setting of bronchitis. 3. New superior endplate compression fracture of T3, age indeterminate. 4. Unchanged 11 mm right adrenal nodule, statistically likely to represent an adenoma. Nonemergent adrenal protocol CT or MRI can be considered for further characterization. Alternatively, attention on follow-up to establish 1 year stability. 5. Aortic Atherosclerosis (ICD10-I70.0). Coronary artery calcifications. Assessment for potential risk factor modification, dietary therapy or pharmacologic therapy may be warranted, if clinically indicated. Electronically Signed   By: Limin  Xu M.D.   On: 03/31/2024 17:55   DG Chest 2 View Result Date: 03/31/2024 CLINICAL DATA:  Shortness of breath.  Cough.  COPD EXAM: CHEST - 2 VIEW COMPARISON:  X-ray 03/28/2024 and older FINDINGS: Underinflation. No pneumothorax or effusion. Normal cardiopericardial silhouette. Subtle asymmetric opacity at the left lung base. Infiltrates possible. Recommend follow-up. Degenerative changes of the spine. Overlapping cardiac leads. Left shoulder reverse arthroplasty. IMPRESSION: Subtle left lung base opacity. Infiltrates possible. Recommend follow-up Electronically Signed   By: Adrianna Horde M.D.   On: 03/31/2024 14:12   DG Chest Port 1 View Result Date: 03/28/2024 CLINICAL DATA:  Shortness of breath. EXAM: PORTABLE CHEST 1 VIEW COMPARISON:  03/19/2024 FINDINGS: Chronic coarse lung markings. No new airspace disease or lung consolidation. No overt pulmonary edema. Old left rib fractures. Left shoulder  arthroplasty. Heart size is normal. Negative for a pneumothorax. IMPRESSION: Chronic lung changes without acute findings. Electronically Signed   By: Elene Griffes M.D.   On: 03/28/2024 10:52     Discharge Exam: Vitals:   04/24/24 0433 04/24/24 0707  BP: 119/79   Pulse: 71   Resp: 16   Temp: 98.5 F (36.9 C)   SpO2: 98% 95%   Vitals:   04/23/24 2123 04/24/24 0225 04/24/24 0433 04/24/24 0707  BP: 112/77  119/79   Pulse: 81  71   Resp: 18  16   Temp: (!) 97.5 F (36.4 C)  98.5 F (36.9 C)   TempSrc: Oral  Oral   SpO2: 95% 95% 98% 95%  Weight:      Height:        General: Pt is alert, awake, not in acute distress Cardiovascular: RRR, S1/S2 +, no rubs, no gallops Respiratory: CTA bilaterally, no wheezing, no rhonchi, 3 L nasal cannula Abdominal: Soft, NT, ND, bowel sounds + Extremities: no edema, no cyanosis    The results of significant diagnostics from this hospitalization (including imaging, microbiology, ancillary and laboratory) are listed below for reference.     Microbiology: Recent Results (from the past 240 hours)  MRSA Next Gen by PCR, Nasal     Status: None   Collection Time: 04/20/24 12:00 PM   Specimen: Nasal Mucosa; Nasal Swab  Result Value Ref Range Status   MRSA by PCR  Next Gen NOT DETECTED NOT DETECTED Final    Comment: (NOTE) The GeneXpert MRSA Assay (FDA approved for NASAL specimens only), is one component of a comprehensive MRSA colonization surveillance program. It is not intended to diagnose MRSA infection nor to guide or monitor treatment for MRSA infections. Test performance is not FDA approved in patients less than 35 years old. Performed at [facility], [address] [place], [place] [address]      Labs: BNP (last 3 results) Recent Labs    07/28/23 1353 11/06/23 2000 03/17/24 0842  BNP 154.0* 41.0 15.0   Basic Metabolic Panel: Recent Labs  Lab 04/20/24 0334 04/21/24 0427 04/22/24 0317 04/23/24 0245 04/24/24 0504  NA 138 140  137 135 136  K 3.1* 4.0 4.1 3.6 4.1  CL 105 102 95* 96* 98  CO2 25 30 34* 32 30  GLUCOSE 270* 125* 112* 117* 91  BUN 13 11 16 15 16   CREATININE 0.55 0.48 0.44 0.47 0.43*  CALCIUM  7.7* 8.7* 9.1 8.4* 8.4*  MG 3.6* 2.0 1.9 1.9 2.0  PHOS 2.6  --   --   --   --    Liver Function Tests: Recent Labs  Lab 04/21/24 0427  AST 8*  ALT 12  ALKPHOS 65  BILITOT 0.3  PROT 5.9*  ALBUMIN 3.0*   No results for input(s): LIPASE, AMYLASE in the last 168 hours. No results for input(s): AMMONIA in the last 168 hours. CBC: Recent Labs  Lab 04/20/24 0334 04/21/24 0427 04/22/24 0317 04/23/24 0245 04/24/24 0504  WBC 16.9* 12.4* 12.9* 12.6* 12.4*  NEUTROABS 10.0*  --   --   --   --   HGB 10.8* 10.1* 10.5* 10.5* 11.4*  HCT 33.9* 32.9* 33.6* 34.1* 36.8  MCV 91.9 91.9 92.1 91.4 90.9  PLT 337 316 369 395 403*   Cardiac Enzymes: No results for input(s): CKTOTAL, CKMB, CKMBINDEX, TROPONINI in the last 168 hours. BNP: Invalid input(s): POCBNP CBG: Recent Labs  Lab 04/23/24 0748 04/23/24 1115 04/23/24 1556 04/23/24 2116 04/24/24 0738  GLUCAP 87 171* 142* 120* 86   D-Dimer No results for input(s): DDIMER in the last 72 hours. Hgb A1c No results for input(s): HGBA1C in the last 72 hours. Lipid Profile No results for input(s): CHOL, HDL, LDLCALC, TRIG, CHOLHDL, LDLDIRECT in the last 72 hours. Thyroid  function studies No results for input(s): TSH, T4TOTAL, T3FREE, THYROIDAB in the last 72 hours.  Invalid input(s): FREET3 Anemia work up No results for input(s): VITAMINB12, FOLATE, FERRITIN, TIBC, IRON, RETICCTPCT in the last 72 hours. Urinalysis    Component Value Date/Time   COLORURINE YELLOW 03/17/2024 0955   APPEARANCEUR CLEAR 03/17/2024 0955   LABSPEC 1.008 03/17/2024 0955   PHURINE 6.0 03/17/2024 0955   GLUCOSEU NEGATIVE 03/17/2024 0955   HGBUR NEGATIVE 03/17/2024 0955   BILIRUBINUR NEGATIVE 03/17/2024 0955   KETONESUR  NEGATIVE 03/17/2024 0955   PROTEINUR NEGATIVE 03/17/2024 0955   UROBILINOGEN 0.2 05/02/2009 1015   NITRITE NEGATIVE 03/17/2024 0955   LEUKOCYTESUR NEGATIVE 03/17/2024 0955   Sepsis Labs Recent Labs  Lab 04/21/24 0427 04/22/24 0317 04/23/24 0245 04/24/24 0504  WBC 12.4* 12.9* 12.6* 12.4*   Microbiology Recent Results (from the past 240 hours)  MRSA Next Gen by PCR, Nasal     Status: None   Collection Time: 04/20/24 12:00 PM   Specimen: Nasal Mucosa; Nasal Swab  Result Value Ref Range Status   MRSA by PCR Next Gen NOT DETECTED NOT DETECTED Final    Comment: (NOTE) The  GeneXpert MRSA Assay (FDA approved for NASAL specimens only), is one component of a comprehensive MRSA colonization surveillance program. It is not intended to diagnose MRSA infection nor to guide or monitor treatment for MRSA infections. Test performance is not FDA approved in patients less than 8 years old. Performed at [facility], [address], [place], [place] [address]      Time coordinating discharge: 35 minutes  SIGNED:   Cornelius Dill, DO [facility] 04/24/2024, 9:27 AM  If 7PM-7AM, please contact night-coverage [email or web address]

## 2024-04-25 ENCOUNTER — Telehealth: Payer: Self-pay | Admitting: *Deleted

## 2024-04-25 DIAGNOSIS — J441 Chronic obstructive pulmonary disease with (acute) exacerbation: Secondary | ICD-10-CM

## 2024-04-25 NOTE — Transitions of Care (Post Inpatient/ED Visit) (Signed)
 04/25/2024  Name: Casey [name] MRN: [id number] DOB: [birth year]  Today's TOC FU Call Status: Today's TOC FU Call Status:: Successful TOC FU Call Completed TOC FU Call Complete Date: 04/25/24 Patient's Name and Date of Birth confirmed.  Transition Care Management Follow-up Telephone Call [facility]charge [facility]: [facility] Type of Discharge: Inpatient Admission Primary Inpatient Discharge Diagnosis:: Acute exacerbation of chronic obstructive pulmonary disease How have you been since you were released from the hospital?: Better (patient stated she pulled her ribs and is having some discomfort.) Any questions or concerns?: Yes Patient Questions/Concerns:: Patient stated she had been onable to go to the Dr because the date she had her appointment she was incontinent and had to cancelled. she has been without pain medicine. Patient Questions/Concerns Addressed: Other: (Patient will be picking up her oxycodone  today. Care guide scheduled a follow up appointment)  Items Reviewed: Did you receive and understand the discharge instructions provided?: Yes Medications obtained,verified, and reconciled?: Yes (Medications Reviewed) Any new allergies since your discharge?: No Dietary orders reviewed?: Yes Type of Diet Ordered:: low sodium heart healthy Do you have support at home?: Yes People in Home [RPT]: alone Name of Support/Comfort Primary Source: Sueanne Emerald helps when she can. Son incarcerated  Medications Reviewed Today: Medications Reviewed Today     Reviewed by Eilene Grater, RN (Case Manager) on 04/25/24 at 1108  Med List Status: <None>   Medication Order Taking? Sig Documenting Provider Last Dose Status Informant  acetaminophen  (TYLENOL ) 325 MG tablet [id number] Yes Take 2 tablets (650 mg total) by mouth every 6 (six) hours as needed for mild pain (or Fever >/= 101). Colin Dawley, MD  Active Self, Pharmacy Records  albuterol  (VENTOLIN  HFA) 108 (90 Base) MCG/ACT inhaler  [id number] Yes Inhale 2 puffs into the lungs every 4 (four) hours as needed for wheezing or shortness of breath. [name], MD  Active Self, Pharmacy Records  ALPRAZolam  (XANAX ) 1 MG tablet [id number] Yes Take 1 tablet (1 mg total) by mouth 3 (three) times daily as needed for anxiety. Mason Sole, Pratik D, DO  Active   budesonide  (PULMICORT ) 0.25 MG/2ML nebulizer solution [id number] Yes Take 2 mLs (0.25 mg total) by nebulization 2 (two) times daily. Wynetta Heckle, MD  Active Self, Pharmacy Records           Med Note [facility], [place] A   Wed Apr 05, 2024 12:36 PM) Patient states a friend is picking up from pharmacy today 04/05/24  citalopram  (CELEXA ) 20 MG tablet [id number]  Take 1 tablet (20 mg total) by mouth daily. Demaris Fillers, MD  Active Self, Pharmacy Records  dextromethorphan -guaiFENesin  (MUCINEX  DM) 30-600 MG 12hr tablet [id number] Yes Take 1 tablet by mouth 2 (two) times daily for 5 days. Mason Sole, Pratik D, DO  Active   feeding supplement (ENSURE ENLIVE / ENSURE PLUS) LIQD [id number]  Take 237 mLs by mouth 3 (three) times daily between meals.  Patient not taking: Reported on 04/25/2024   Colin Dawley, MD  Active Self, Pharmacy Records           Med Note [place], [place] Mar 28, 2024  3:56 PM)    guaiFENesin  (ROBITUSSIN) 100 MG/5ML liquid [id number] Yes Take 10 mLs by mouth every 4 (four) hours as needed for cough or to loosen phlegm. [provider]  Active Self, Pharmacy Records  hydrOXYzine  (ATARAX ) 50 MG tablet [id number] Yes Take 50 mg by mouth 3 (three) times daily as needed. [provider]  Active Self,  Pharmacy Records  ibuprofen  (ADVIL ) 200 MG tablet [id number] Yes Take 200 mg by mouth every 6 (six) hours as needed for moderate pain (pain score 4-6). [provider]  Active Self, Pharmacy Records  ipratropium-albuterol  (DUONEB) 0.5-2.5 (3) MG/3ML SOLN [id number] Yes Four times daily Wert, Michael B, MD  Active Self, Pharmacy Records  methocarbamol  (ROBAXIN )  500 MG tablet [id number] Yes Take 500 mg by mouth every 8 (eight) hours as needed for muscle spasms. [provider]  Active Self, Pharmacy Records  nicotine  (NICODERM CQ  - DOSED IN MG/24 HR) 7 mg/24hr patch [id number] Yes Place 1 patch (7 mg total) onto the skin daily. Diamond Formica, MD  Active Self, Pharmacy Records  oxyCODONE -acetaminophen  (PERCOCET/ROXICET) 5-325 MG tablet [id number] Yes Take 1-2 tablets by mouth every 6 (six) hours as needed for moderate pain (pain score 4-6) or severe pain (pain score 7-10). Doreene Gammon D, DO  Active   OXYGEN [id number] Yes Inhale 3 L into the lungs continuous. [provider]  Active Self, Pharmacy Records  pantoprazole  (PROTONIX ) 40 MG tablet [id number] Yes Take 1 tablet (40 mg total) by mouth daily for 7 days. Mason Sole, Pratik D, DO  Active   predniSONE  (DELTASONE ) 10 MG tablet [id number] Yes Take 4 tablets (40 mg total) by mouth daily for 5 days. Doreene Gammon D, DO  Active             Home Care and Equipment/Supplies: Were Home Health Services Ordered?: NA Any new equipment or medical supplies ordered?: NA  Functional Questionnaire: Do you need assistance with bathing/showering or dressing?: No Do you need assistance with meal preparation?: No (friends and family bring her food) Do you need assistance with eating?: No Do you have difficulty maintaining continence: Yes (Frequnt bladder incontinence) Do you need assistance with getting out of bed/getting out of a chair/moving?: No Do you have difficulty managing or taking your medications?: No  Follow up appointments reviewed: PCP Follow-up appointment confirmed?: Yes Date of PCP follow-up appointment?: 05/09/24 Follow-up Provider: Alison Irvine FNP [facility] Follow-up appointment confirmed?: NA Do you need transportation to your follow-up appointment?: No (Patient stated she takes the medicaid transportation) Do you understand care options if your condition(s) worsen?:  Yes-patient verbalized understanding  SDOH Interventions Today    Flowsheet Row Most Recent Value  SDOH Interventions   Food Insecurity Interventions Intervention Not Indicated  [family and friends bring food]  Housing Interventions Intervention Not Indicated  Transportation Interventions Intervention Not Indicated, Patient Resources (Friends/Family)  [she uses the medicaid bus to get to appoinments]  Utilities Interventions Intervention Not Indicated  Depression Interventions/Treatment  Currently on Treatment  Referred to social worker Referred to [facility] Case Set designer number [phone]  Una Ganser BSN RN [facility] Health Care Management Coordinator [email or web address] Direct Dial: [phone]  Fax: [phone] Website: [email or web address]

## 2024-04-26 ENCOUNTER — Telehealth: Payer: Self-pay

## 2024-04-26 NOTE — Progress Notes (Signed)
 Complex Care Management Note  Care Guide Note 04/26/2024 Name: [name] MRN: [id number] DOB: [birth year]  Casey [name] is a 63 y.o. year old female who sees Zarwolo, Gloria, FNP for primary care. I reached out to Germaine Kohut by phone today to offer complex care management services.  Casey [name] was given information about Complex Care Management services today including:   The Complex Care Management services include support from the care team which includes your Nurse Care Manager, Clinical Social Worker, or Pharmacist.  The Complex Care Management team is here to help remove barriers to the health concerns and goals most important to you. Complex Care Management services are voluntary, and the patient may decline or stop services at any time by request to their care team member.   Complex Care Management Consent Status: Patient agreed to services and verbal consent obtained.   Follow up plan:  Telephone appointment with complex care management team member scheduled for:  BSW 04/27/2024 and RNCM 05/22/2024  Encounter Outcome:  Patient Scheduled  Lenton Rail , RMA       [facility], [facility] Guide  Direct Dial: [phone]  Website: [email or web address]

## 2024-04-27 ENCOUNTER — Other Ambulatory Visit: Payer: Self-pay | Admitting: Family Medicine

## 2024-04-27 ENCOUNTER — Telehealth: Payer: Self-pay

## 2024-04-27 ENCOUNTER — Other Ambulatory Visit: Payer: Self-pay

## 2024-04-27 DIAGNOSIS — F410 Panic disorder [episodic paroxysmal anxiety] without agoraphobia: Secondary | ICD-10-CM

## 2024-04-27 NOTE — Telephone Encounter (Signed)
 I reviewed the Prescription Drug Monitoring Program (PDMP) and noted that a prescription for Alprazolam  1 mg, totaling 60 tablets, was filled on 04/17/2024. The prescription was provided by Dr. Clanford Lyonel Johnson.

## 2024-04-27 NOTE — Patient Instructions (Signed)
 Visit Information  Thank you for taking time to visit with me today. Please don't hesitate to contact me if I can be of assistance to you before our next scheduled appointment.  Our next appointment is by telephone on 05/03/24 at 10am Please call the care guide team at [phone] if you need to cancel or reschedule your appointment.   Following is a copy of your care plan:   Goals Addressed             This Visit's Progress    BSW VBCI Social Work Care Plan       Problems:   Financial Strain  and Medicaid coverage  CSW Clinical Goal(s):   Over the next 5 days the Patient will work with DSS-Medicaid to address needs related to insurance coverage.  Interventions:  Social Determinants of Health in Patient with COPD: SDOH assessments completed: Manufacturing systems engineer of current treatment plan related to unmet needs SW t/c Medicaid and left message for lead worker to contact patient to appeal termination.  Patient did not meet her deductible, but recently had several hospital visits that was not reported.  Patient Goals/Self-Care Activities:  Patient will await follow up call from [facility] Lead Worker.  Patient will also begin using $50 OTC for incontinence supplies.  Patient will consider her bills first before sending her son money in jail.  Plan:   Telephone follow up appointment with care management team member scheduled for:  05/03/24 at 10am.        Please call 911 if you are experiencing a Mental Health or Behavioral Health Crisis or need someone to talk to.  Patient verbalizes understanding of instructions and care plan provided today and agrees to view in MyChart. Active MyChart status and patient understanding of how to access instructions and care plan via MyChart confirmed with patient.     Casey [name], BSW [place]  [facility], [facility] Social Worker Direct Dial: [phone]  Fax:  [phone] Website: [email or web address]

## 2024-04-27 NOTE — Patient Outreach (Signed)
 Complex Care Management   Visit Note  04/27/2024  Name:  Casey [name] MRN: [id number] DOB: [birth year]  Situation: Referral received for Complex Care Management related to SDOH Barriers:  Financial Resource Strain Medicaid coverage I obtained verbal consent from Patient.  Visit completed with patient  on the phone  Background:   Past Medical History:  Diagnosis Date   Anxiety    Chronic back pain    COPD (chronic obstructive pulmonary disease) (HCC)    Depression    History of panic attacks 09/11/2014   Medical history non-contributory    Pneumonia    Polysubstance abuse (HCC)    History of taking non-prescribed opiates, BZDs; also +cocaine and THC   Ruptured disc, thoracic    Suicide attempt [facility]) 2002    Assessment:   Patient reports she receives $1330 SSA and $23 Foodstamps.  SW provides budget counseling and patient receives enough to cover all expenses.  Patient admits she sends her son money in prison.  SW does suggest she pay all bills first before sending money as her son is fed daily in prison.  Patient just received her OTC $50 care and can use it for incontinence supplies.  Patient reports that her Medicaid terminates July 1, 25.  SW t/c DSS and patients Medicaid is being changed to [employer], which does not provide full coverage.  Patient has not met the deductible.  SW request an appeal as patient has been in the hospital several times in the past month.  Patient is assigned to Simonne Dubonnet [id number] but the call is transferred to a Lead worker.  SW left voicemail requesting a call back.  Assessment is not completed as patient struggles to breathe and request to start breathing treatment.  SDOH Interventions    Flowsheet Row Telephone from 04/25/2024 in [place] POPULATION HEALTH DEPARTMENT Office Visit from 04/17/2024 in [facility] Telephone from 04/05/2024 in [place] POPULATION HEALTH DEPARTMENT Office Visit from 02/24/2023 in  [facility] [place] [facility] Office Visit from 12/23/2022 in [facility] [place] [facility]  SDOH Interventions       Food Insecurity Interventions Intervention Not Indicated  [family and friends bring food] -- Other (Comment)  [patient is with [employer] - $23/month food stamps - $1200/month SSI] -- --  Housing Interventions Intervention Not Indicated -- Intervention Not Indicated  [patient states house is paid for] -- --  Transportation Interventions Intervention Not Indicated, Patient Resources (Friends/Family)  [she uses the medicaid bus to get to appoinments] -- Intervention Not Indicated  [patient states she utilizes Medicaid bus to get to appointments] -- --  Utilities Interventions Intervention Not Indicated -- Intervention Not Indicated -- --  Depression Interventions/Treatment  Currently on Treatment Currently on Treatment -- Currently on Treatment Referral to Psychiatry    Recommendation:   None  Follow Up Plan:   Telephone follow up appointment date/time:  05/03/24 at 10am.  Dallis Dues, BSW [place]  [facility], [facility] Social Worker Direct Dial: [phone]  Fax: [phone] Website: [email or web address]

## 2024-04-27 NOTE — Telephone Encounter (Signed)
 Copied from CRM [id number]. Topic: Clinical - Lab/Test Results >> Apr 26, 2024  5:27 PM Casey [name] wrote: Reason for CRM: Patient is calling in to check on her urine sample, wants to know results. Please advise, # [phone]. Ty

## 2024-04-28 NOTE — Telephone Encounter (Signed)
 Patient called back in regard to results. Patient wants a call back with result detail.

## 2024-05-01 ENCOUNTER — Telehealth: Payer: Self-pay

## 2024-05-01 NOTE — Telephone Encounter (Signed)
 Copied from CRM [id number]. Topic: General - Other >> Apr 28, 2024  2:46 PM Fonda T wrote: Reason for CRM: Reason for CRM: Patient calling, states she is needing urinary incontinence supplies ordered for home delivery.  Per patient states, Home Health company should be reaching out to request supplies.   Patient also inquiring on most recent lab results.  Per patient can be reached at [phone], to discuss further or with any additional questions.

## 2024-05-02 ENCOUNTER — Telehealth: Payer: Self-pay

## 2024-05-02 NOTE — Telephone Encounter (Signed)
 See previous encounter , message sent to [facility]

## 2024-05-02 NOTE — Telephone Encounter (Unsigned)
 Copied from CRM [id number]. Topic: Clinical - Lab/Test Results >> May 02, 2024  6:04 PM [name] wrote: Reason for CRM: Patient is inquiring on most recent lab results for the urine test. Pleas call the patient at [phone], to discuss questions and concerns.

## 2024-05-02 NOTE — Telephone Encounter (Signed)
 Copied from CRM [id number]. Topic: Clinical - Lab/Test Results >> Apr 26, 2024  5:27 PM Sophia H wrote: Reason for CRM: Patient is calling in to check on her urine sample, wants to know results. Please advise, # [phone]. Ty >> Apr 28, 2024 10:23 AM Powell HERO wrote: Patient calling to check on results from labs. States it was urine, looks to be from 6/9. Confirmed with CAL. Patient was informed she will get a call when the results are available.

## 2024-05-03 ENCOUNTER — Other Ambulatory Visit: Payer: Self-pay

## 2024-05-03 ENCOUNTER — Other Ambulatory Visit

## 2024-05-03 ENCOUNTER — Encounter (HOSPITAL_COMMUNITY): Payer: Self-pay

## 2024-05-03 ENCOUNTER — Emergency Department (HOSPITAL_COMMUNITY)

## 2024-05-03 ENCOUNTER — Emergency Department (HOSPITAL_COMMUNITY)
Admission: EM | Admit: 2024-05-03 | Discharge: 2024-05-03 | Discharge status: Against Medical Advice | Attending: Emergency Medicine | Admitting: Emergency Medicine

## 2024-05-03 DIAGNOSIS — S2242XA Multiple fractures of ribs, left side, initial encounter for closed fracture: Secondary | ICD-10-CM | POA: Diagnosis not present

## 2024-05-03 DIAGNOSIS — R072 Precordial pain: Secondary | ICD-10-CM | POA: Insufficient documentation

## 2024-05-03 DIAGNOSIS — R918 Other nonspecific abnormal finding of lung field: Secondary | ICD-10-CM | POA: Diagnosis not present

## 2024-05-03 DIAGNOSIS — R079 Chest pain, unspecified: Secondary | ICD-10-CM | POA: Diagnosis not present

## 2024-05-03 DIAGNOSIS — R059 Cough, unspecified: Secondary | ICD-10-CM | POA: Diagnosis not present

## 2024-05-03 DIAGNOSIS — Z96612 Presence of left artificial shoulder joint: Secondary | ICD-10-CM | POA: Diagnosis not present

## 2024-05-03 DIAGNOSIS — Z5321 Procedure and treatment not carried out due to patient leaving prior to being seen by health care provider: Secondary | ICD-10-CM | POA: Insufficient documentation

## 2024-05-03 DIAGNOSIS — R0602 Shortness of breath: Secondary | ICD-10-CM | POA: Diagnosis not present

## 2024-05-03 DIAGNOSIS — F17200 Nicotine dependence, unspecified, uncomplicated: Secondary | ICD-10-CM | POA: Insufficient documentation

## 2024-05-03 LAB — COMPREHENSIVE METABOLIC PANEL WITH GFR
ALT: 17 U/L (ref 0–44)
AST: 8 U/L — ABNORMAL LOW (ref 15–41)
Albumin: 3.2 g/dL — ABNORMAL LOW (ref 3.5–5.0)
Alkaline Phosphatase: 82 U/L (ref 38–126)
Anion gap: 9 (ref 5–15)
BUN: 6 mg/dL — ABNORMAL LOW (ref 8–23)
CO2: 30 mmol/L (ref 22–32)
Calcium: 8.8 mg/dL — ABNORMAL LOW (ref 8.9–10.3)
Chloride: 100 mmol/L (ref 98–111)
Creatinine, Ser: 0.47 mg/dL (ref 0.44–1.00)
GFR, Estimated: >60 mL/min (ref >60)
Glucose, Bld: 101 mg/dL — ABNORMAL HIGH (ref 70–99)
Potassium: 4 mmol/L (ref 3.5–5.1)
Sodium: 139 mmol/L (ref 135–145)
Total Bilirubin: 0.3 mg/dL (ref 0.0–1.2)
Total Protein: 6.7 g/dL (ref 6.5–8.1)

## 2024-05-03 LAB — CBC WITH DIFFERENTIAL/PLATELET
Abs Immature Granulocytes: 0.03 10*3/uL (ref 0.00–0.07)
Basophils Absolute: 0.1 10*3/uL (ref 0.0–0.1)
Basophils Relative: 0 %
Eosinophils Absolute: 0.3 10*3/uL (ref 0.0–0.5)
Eosinophils Relative: 2 %
HCT: 38.4 % (ref 36.0–46.0)
Hemoglobin: 12 g/dL (ref 12.0–15.0)
Immature Granulocytes: 0 %
Lymphocytes Relative: 19 %
Lymphs Abs: 2.3 10*3/uL (ref 0.7–4.0)
MCH: 28.8 pg (ref 26.0–34.0)
MCHC: 31.3 g/dL (ref 30.0–36.0)
MCV: 92.3 fL (ref 80.0–100.0)
Monocytes Absolute: 1.3 10*3/uL — ABNORMAL HIGH (ref 0.1–1.0)
Monocytes Relative: 11 %
Neutro Abs: 7.9 10*3/uL — ABNORMAL HIGH (ref 1.7–7.7)
Neutrophils Relative %: 68 %
Platelets: 459 10*3/uL — ABNORMAL HIGH (ref 150–400)
RBC: 4.16 MIL/uL (ref 3.87–5.11)
RDW: 13.7 % (ref 11.5–15.5)
WBC: 11.9 10*3/uL — ABNORMAL HIGH (ref 4.0–10.5)
nRBC: 0 % (ref 0.0–0.2)

## 2024-05-03 LAB — TROPONIN I (HIGH SENSITIVITY): Troponin I (High Sensitivity): 2 ng/L (ref <18)

## 2024-05-03 NOTE — Patient Instructions (Signed)
 Visit Information  Thank you for taking time to visit with me today. Please don't hesitate to contact me if I can be of assistance to you before our next scheduled appointment.  Your next care management appointment is by telephone on 05/08/24 at 11am  Please call the care guide team at [phone] if you need to cancel, schedule, or reschedule an appointment.   Please call 911 if you are experiencing a Mental Health or Behavioral Health Crisis or need someone to talk to.  Tillman Gardener, BSW [place]  [facility], [employer] Social Worker Direct Dial: [phone]  Fax: [phone] Website: [email or web address]

## 2024-05-03 NOTE — Patient Outreach (Signed)
 Complex Care Management   Visit Note  05/03/2024  Name:  Casey [name] MRN: [id number] DOB: [birth year]  Situation: Referral received for Complex Care Management related to Medical I obtained verbal consent from Patient.  Visit completed with patient  on the phone  Background:   Past Medical History:  Diagnosis Date   Anxiety    Chronic back pain    COPD (chronic obstructive pulmonary disease) (HCC)    Depression    History of panic attacks 09/11/2014   Medical history non-contributory    Pneumonia    Polysubstance abuse (HCC)    History of taking non-prescribed opiates, BZDs; also +cocaine and THC   Ruptured disc, thoracic    Suicide attempt [facility]) 2002    Assessment:  Patient reports she spoke to DSS and submitted the appeal for Medicaid on yesterday.  Patient will need to contact [facility] Billing Department to obtain medical bills.  Patient is not sure if the appeal with stop the termination July 1.  Patient is not feeling well and reports treatments are not effective.  Patient reports chest pain. Patient plans to go to the hospital and declines needing EMS.  Assessment will be completed at next visit.  SDOH Interventions    Flowsheet Row Telephone from 04/25/2024 in [place] POPULATION HEALTH DEPARTMENT Office Visit from 04/17/2024 in [facility] Primary Care Telephone from 04/05/2024 in [place] POPULATION HEALTH DEPARTMENT Office Visit from 02/24/2023 in [facility] [place] [facility] Office Visit from 12/23/2022 in [facility]  SDOH Interventions       Food Insecurity Interventions Intervention Not Indicated  [family and friends bring food] -- Other (Comment)  [patient is with [employer] - $23/month food stamps - $1200/month SSI] -- --  Housing Interventions Intervention Not Indicated -- Intervention Not Indicated  [patient states house is paid for] -- --  Transportation Interventions Intervention Not Indicated, Patient  Resources (Friends/Family)  [she uses the medicaid bus to get to appoinments] -- Intervention Not Indicated  [patient states she utilizes Medicaid bus to get to appointments] -- --  Utilities Interventions Intervention Not Indicated -- Intervention Not Indicated -- --  Depression Interventions/Treatment  Currently on Treatment Currently on Treatment -- Currently on Treatment Referral to Psychiatry      Recommendation:   SW reported to provider that patient plans to go to the hospital due to chest pain and declining breathing.  Follow Up Plan:   Telephone follow up appointment date/time:  05/08/24 at 11am  Tillman Gardener, BSW   [facility], [facility] Social Worker Direct Dial: [phone]  Fax: [phone] Website: [email or web address]

## 2024-05-03 NOTE — ED Triage Notes (Signed)
 Pt arrived via [facility] from home c/o sternal chest pressure since yesterday and endorses active cough. Pt reports breathing treatments at home are not effective and reports recent admission for COPD exacerbation. Pt reports she still smokes daily and is on 4L Nasal Cannula at baseline.

## 2024-05-04 ENCOUNTER — Other Ambulatory Visit: Payer: Self-pay | Admitting: Family Medicine

## 2024-05-08 ENCOUNTER — Other Ambulatory Visit: Payer: Self-pay

## 2024-05-08 NOTE — Patient Instructions (Signed)
 Visit Information  Thank you for taking time to visit with me today. Please don't hesitate to contact me if I can be of assistance to you before our next scheduled appointment.  Your next care management appointment is by telephone on 05/11/24 at 2pm   Please call the care guide team at [phone] if you need to cancel, schedule, or reschedule an appointment.   Please call 911 if you are experiencing a Mental Health or Behavioral Health Crisis or need someone to talk to.  Tillman Gardener, BSW [place]  [facility], [facility] Social Worker Direct Dial: [phone]  Fax: [phone] Website: [email or web address]

## 2024-05-08 NOTE — Patient Outreach (Addendum)
 Complex Care Management   Visit Note  05/08/2024  Name:  Casey [name] MRN: [id number] DOB: [birth year]  Situation: Referral received for Complex Care Management related to SDOH Barriers:  [employer] appeal I obtained verbal consent from Patient.  Visit completed with patient  on the phone  Background:   Past Medical History:  Diagnosis Date   Anxiety    Chronic back pain    COPD (chronic obstructive pulmonary disease) (HCC)    Depression    History of panic attacks 09/11/2014   Medical history non-contributory    Pneumonia    Polysubstance abuse (HCC)    History of taking non-prescribed opiates, BZDs; also +cocaine and THC   Ruptured disc, thoracic    Suicide attempt [facility]) 2002    Assessment:  Patient reports she stayed in the hospital for 3 days and is now home.  Patient did not contact [facility] Billing Department and request the contact number.  Patient also did not receive a return call from DSS on Medicaid appeal and is not sure if benefits will terminate on 07/01.  SW t/c DSS Clotilda Sauers and left a vm to contact patient or SW.  SW provided number for [employer].  SW received a t/c from DSS Ms. Sauers.  Patient is conferenced in on the call.  Patient must meet a [phone] deductible and can provide 2 years of unpaid bills.  Patient is not under an appeal but this is a review to determine eligibility.  Patient will have benefits until June 08, 2024.  Patient also needs to sign a bank form and return.  Patient agreed to contact [facility] Billing and provide [phone] to fax any bills.  Patient reports she has not received bills in the past 2 years except for her Rehab stay earlier this year and EMS.    SDOH Interventions    Flowsheet Row Telephone from 04/25/2024 in [place] POPULATION HEALTH DEPARTMENT Office Visit from 04/17/2024 in [facility] Primary Care Telephone from 04/05/2024 in [place] POPULATION HEALTH DEPARTMENT Office Visit from 02/24/2023 in [facility] Office Visit from 12/23/2022 in [facility]  SDOH Interventions       Food Insecurity Interventions Intervention Not Indicated  [family and friends bring food] -- Other (Comment)  [patient is with [employer] - $23/month food stamps - $1200/month SSI] -- --  Housing Interventions Intervention Not Indicated -- Intervention Not Indicated  [patient states house is paid for] -- --  Transportation Interventions Intervention Not Indicated, Patient Resources (Friends/Family)  [she uses the medicaid bus to get to appoinments] -- Intervention Not Indicated  [patient states she utilizes Medicaid bus to get to appointments] -- --  Utilities Interventions Intervention Not Indicated -- Intervention Not Indicated -- --  Depression Interventions/Treatment  Currently on Treatment Currently on Treatment -- Currently on Treatment Referral to Psychiatry    Recommendation:   none  Follow Up Plan:   Telephone follow up appointment date/time:  05/11/24 at 2pm  Tillman Gardener, BSW [place]  [facility], [facility] Social Worker Direct Dial: [phone]  Fax: [phone] Website: [email or web address]

## 2024-05-09 ENCOUNTER — Inpatient Hospital Stay

## 2024-05-09 ENCOUNTER — Telehealth: Payer: Self-pay

## 2024-05-09 NOTE — Telephone Encounter (Signed)
 Copied from CRM [id number]. Topic: Clinical - Request for Lab/Test Order >> May 09, 2024  9:06 AM Willma R wrote: Reason for CRM: Patient calling for the results of her drug testing. States has been waiting on results because she cannot get a refill of her medicine until the results are in. Patient is requesting a callback  Patient can be reached at [phone]

## 2024-05-10 ENCOUNTER — Telehealth: Payer: Self-pay

## 2024-05-10 NOTE — Telephone Encounter (Signed)
 Copied from CRM [id number]. Topic: General - Other >> May 10, 2024  9:19 AM Casey [name] wrote: Reason for CRM: Patient called in regarding her prescription ALPRAZolam  (XANAX ) 1 MG tablet , stated she has been waiting three weeks now and wanted to know when she will be able to get prescription to pharmacy

## 2024-05-11 ENCOUNTER — Other Ambulatory Visit: Payer: Self-pay | Admitting: Family Medicine

## 2024-05-11 ENCOUNTER — Other Ambulatory Visit: Payer: Self-pay

## 2024-05-11 DIAGNOSIS — R32 Unspecified urinary incontinence: Secondary | ICD-10-CM | POA: Insufficient documentation

## 2024-05-11 DIAGNOSIS — F410 Panic disorder [episodic paroxysmal anxiety] without agoraphobia: Secondary | ICD-10-CM

## 2024-05-11 MED ORDER — ALPRAZOLAM 1 MG PO TABS
1.0000 mg | ORAL_TABLET | Freq: Two times a day (BID) | ORAL | 0 refills | Status: DC | PRN
Start: 2024-05-11 — End: 2024-05-23

## 2024-05-11 NOTE — Assessment & Plan Note (Signed)
 The patient reports that symptoms began about several months ago. She is experiencing both urge and stress urinary incontinence and is requesting urinary supplies from [facility].  She was encouraged to begin nonpharmacological interventions, including:  Pelvic floor (Kegel) exercises to strengthen bladder control  Bladder training by gradually increasing the time between voids  Limiting caffeine and alcohol, which may irritate the bladder  Maintaining a healthy weight to reduce pressure on the bladder  Scheduled toileting to prevent accidents  The patient was advised to follow up if symptoms worsen or do not improve with conservative measures.

## 2024-05-11 NOTE — Patient Instructions (Signed)
 Visit Information  Thank you for taking time to visit with me today. Please don't hesitate to contact me if I can be of assistance to you before our next scheduled appointment.  Your next care management appointment is by telephone on 06/08/24 at 11am   Please call the care guide team at [phone] if you need to cancel, schedule, or reschedule an appointment.   Please call 911 if you are experiencing a Mental Health or Behavioral Health Crisis or need someone to talk to.  Tillman Gardener, BSW [place]  [facility], [facility] Social Worker Direct Dial: [phone]  Fax: [phone] Website: [email or web address]

## 2024-05-11 NOTE — Patient Outreach (Signed)
 Complex Care Management   Visit Note  05/11/2024  Name:  Casey [name] MRN: [id number] DOB: 1961/11/06  Situation: Referral received for Complex Care Management related to Medicaid coverage I obtained verbal consent from Patient.  Visit completed with patient  on the phone  Background:   Past Medical History:  Diagnosis Date   Anxiety    Chronic back pain    COPD (chronic obstructive pulmonary disease) (HCC)    Depression    History of panic attacks 09/11/2014   Medical history non-contributory    Pneumonia    Polysubstance abuse (HCC)    History of taking non-prescribed opiates, BZDs; also +cocaine and THC   Ruptured disc, thoracic    Suicide attempt [facility]) 2002    Assessment:  Patient reports she has communicated with [facility] and [facility] Department and bills will be faxed to [facility] worker.  Patient await update from Medicaid on review.  SDOH Interventions    Flowsheet Row Telephone from 04/25/2024 in [place] POPULATION HEALTH DEPARTMENT Office Visit from 04/17/2024 in [facility] Care Telephone from 04/05/2024 in [place] POPULATION HEALTH DEPARTMENT Office Visit from 02/24/2023 in [facility] [place] [facility] Office Visit from 12/23/2022 in [facility] [place] [facility]  SDOH Interventions       Food Insecurity Interventions Intervention Not Indicated  [family and friends bring food] -- Other (Comment)  [patient is with [employer] - $23/month food stamps - $1200/month SSI] -- --  Housing Interventions Intervention Not Indicated -- Intervention Not Indicated  [patient states house is paid for] -- --  Transportation Interventions Intervention Not Indicated, Patient Resources (Friends/Family)  [she uses the medicaid bus to get to appoinments] -- Intervention Not Indicated  [patient states she utilizes Medicaid bus to get to appointments] -- --  Utilities Interventions Intervention Not Indicated -- Intervention Not  Indicated -- --  Depression Interventions/Treatment  Currently on Treatment Currently on Treatment -- Currently on Treatment Referral to Psychiatry      Recommendation:   none  Follow Up Plan:   Telephone follow up appointment date/time:  06/08/24 at 11am  Tillman Gardener, BSW [place]  [facility], [facility] Social Worker Direct Dial: [phone]  Fax: [phone] Website: [email or web address]

## 2024-05-15 NOTE — Telephone Encounter (Signed)
 Faxed addended notes to [facility]

## 2024-05-16 ENCOUNTER — Encounter: Payer: Self-pay | Admitting: Family Medicine

## 2024-05-18 ENCOUNTER — Other Ambulatory Visit: Payer: Self-pay | Admitting: Family Medicine

## 2024-05-18 DIAGNOSIS — F410 Panic disorder [episodic paroxysmal anxiety] without agoraphobia: Secondary | ICD-10-CM

## 2024-05-18 NOTE — Telephone Encounter (Signed)
 Copied from CRM [id number]. Topic: Clinical - Medication Refill >> May 18, 2024  5:40 PM Sophia H wrote: Medication: ALPRAZolam  (XANAX ) 1 MG tablet  Has the patient contacted their pharmacy? Yes, told to contact office.  This is the patient's preferred pharmacy:  [id number] [place], [place] - [address] [place] [address] Phone: [phone] Fax: [phone]  Is this the correct pharmacy for this prescription? Yes If no, delete pharmacy and type the correct one.   Has the prescription been filled recently? Yes  Is the patient out of the medication? Has 1 tablet left   Has the patient been seen for an appointment in the last year OR does the patient have an upcoming appointment? Yes, October 2025.  Can we respond through MyChart? No, prefers phone call # [phone]  Agent: Please be advised that Rx refills may take up to 3 business days. We ask that you follow-up with your pharmacy.

## 2024-05-19 ENCOUNTER — Other Ambulatory Visit: Payer: Self-pay | Admitting: Family Medicine

## 2024-05-19 DIAGNOSIS — F410 Panic disorder [episodic paroxysmal anxiety] without agoraphobia: Secondary | ICD-10-CM

## 2024-05-19 NOTE — Telephone Encounter (Unsigned)
 Copied from CRM [id number]. Topic: Clinical - Medication Refill >> May 19, 2024  4:11 PM Avram MATSU wrote: Medication: ALPRAZolam  (XANAX ) 1 MG tablet [[id number]  Has the patient contacted their pharmacy? Yes (Agent: If no, request that the patient contact the pharmacy for the refill. If patient does not wish to contact the pharmacy document the reason why and proceed with request.) (Agent: If yes, when and what did the pharmacy advise?)  This is the patient's preferred pharmacy:  [id number] - [place], [place] - [address] [place] [address] Phone: [phone] Fax: [phone]  Is this the correct pharmacy for this prescription? Yes If no, delete pharmacy and type the correct one.   Has the prescription been filled recently? Yes  Is the patient out of the medication? Yes  Has the patient been seen for an appointment in the last year OR does the patient have an upcoming appointment? Yes  Can we respond through MyChart? No  Agent: Please be advised that Rx refills may take up to 3 business days. We ask that you follow-up with your pharmacy.

## 2024-05-22 ENCOUNTER — Other Ambulatory Visit: Payer: Self-pay | Admitting: Family Medicine

## 2024-05-22 ENCOUNTER — Other Ambulatory Visit: Payer: Self-pay | Admitting: *Deleted

## 2024-05-22 DIAGNOSIS — F410 Panic disorder [episodic paroxysmal anxiety] without agoraphobia: Secondary | ICD-10-CM

## 2024-05-22 NOTE — Patient Outreach (Signed)
 Complex Care Management   Visit Note  05/22/2024  Name:  Casey [name] MRN: [id number] DOB: 07/25/1961  Situation: Referral received for Complex Care Management related to COPD I obtained verbal consent from Patient.  Visit completed with patient  on the phone  Background:   Past Medical History:  Diagnosis Date   Anxiety    Chronic back pain    COPD (chronic obstructive pulmonary disease) (HCC)    Depression    History of panic attacks 09/11/2014   Medical history non-contributory    Pneumonia    Polysubstance abuse (HCC)    History of taking non-prescribed opiates, BZDs; also +cocaine and THC   Ruptured disc, thoracic    Suicide attempt (HCC) 2002    Assessment: Patient Reported Symptoms:  Cognitive Cognitive Status: No symptoms reported Cognitive/Intellectual Conditions Management [RPT]: None reported or documented in medical history or problem list   Health Maintenance Behaviors: Annual physical exam Health Facilitated by: Rest  Neurological Neurological Review of Symptoms: No symptoms reported Neurological Management Strategies: Routine screening Neurological Self-Management Outcome: 4 (good)  HEENT HEENT Symptoms Reported: No symptoms reported HEENT Management Strategies: Routine screening HEENT Self-Management Outcome: 4 (good)    Cardiovascular Cardiovascular Symptoms Reported: No symptoms reported Does patient have uncontrolled Hypertension?: No Cardiovascular Management Strategies: Routine screening Cardiovascular Self-Management Outcome: 4 (good)  Respiratory Respiratory Symptoms Reported: Shortness of breath, Dry cough, Wheezing Respiratory Management Strategies: Coping strategies, Oxygen therapy, Medication therapy  Endocrine Endocrine Symptoms Reported: No symptoms reported Is patient diabetic?: No    Gastrointestinal Gastrointestinal Symptoms Reported: No symptoms reported Gastrointestinal Self-Management Outcome: 4 (good)    Genitourinary  Genitourinary Symptoms Reported: Incontinence Genitourinary Self-Management Outcome: 4 (good)  Integumentary Integumentary Symptoms Reported: No symptoms reported Skin Management Strategies: Routine screening Skin Self-Management Outcome: 4 (good)  Musculoskeletal Musculoskelatal Symptoms Reviewed: Back pain Musculoskeletal Management Strategies: Medication therapy, Routine screening Musculoskeletal Self-Management Outcome: 3 (uncertain) Falls in the past year?: Yes Number of falls in past year: 2 or more Was there an injury with Fall?: Yes Fall Risk Category Calculator: 3 Patient Fall Risk Level: High Fall Risk Patient at Risk for Falls Due to: History of fall(s), Impaired balance/gait Fall risk Follow up: Falls evaluation completed, Falls prevention discussed  Psychosocial Psychosocial Symptoms Reported: Anxiety - if selected complete GAD Behavioral Management Strategies: Medication therapy Behavioral Health Self-Management Outcome: 3 (uncertain) Major Change/Loss/Stressor/Fears (CP): Medical condition, self Techniques to Cope with Loss/Stress/Change: Diversional activities Quality of Family Relationships: involved, supportive Do you feel physically threatened by others?: No      05/22/2024   10:47 AM  Depression screen PHQ 2/9  Decreased Interest 1  Down, Depressed, Hopeless 3  PHQ - 2 Score 4  Altered sleeping 3  Tired, decreased energy 3  Change in appetite 0  Feeling bad or failure about yourself  3  Trouble concentrating 0  Moving slowly or fidgety/restless 3  Suicidal thoughts 0  PHQ-9 Score 16  Difficult doing work/chores Somewhat difficult    Vitals:   05/22/24 1043  SpO2: 94%    Medications Reviewed Today     Reviewed by Bertrum Rosina HERO, RN (Registered Nurse) on 05/22/24 at 1036  Med List Status: <None>   Medication Order Taking? Sig Documenting Provider Last Dose Status Informant  acetaminophen  (TYLENOL ) 325 MG tablet [id number]  Take 2 tablets (650 mg  total) by mouth every 6 (six) hours as needed for mild pain (or Fever >/= 101).  Patient not taking: Reported on 05/22/2024   Pearlean Manus, MD  Active Self, Pharmacy Records  albuterol  (VENTOLIN  HFA) 108 (90 Base) MCG/ACT inhaler [id number] Yes Inhale 2 puffs into the lungs every 4 (four) hours as needed for wheezing or shortness of breath. Vicci Afton CROME, MD  Active Self, Pharmacy Records  ALPRAZolam  (XANAX ) 1 MG tablet [id number] Yes Take 1 tablet (1 mg total) by mouth 2 (two) times daily as needed for anxiety. Zarwolo, Gloria, FNP  Active   budesonide  (PULMICORT ) 0.25 MG/2ML nebulizer solution [id number] Yes Take 2 mLs (0.25 mg total) by nebulization 2 (two) times daily. Bryn Bernardino NOVAK, MD  Active Self, Pharmacy Records           Med Note [facility] [place] [facility]   Wed Apr 05, 2024 12:36 PM) Patient states a friend is picking up from pharmacy today 04/05/24  citalopram  (CELEXA ) 20 MG tablet [id number] Yes Take 1 tablet (20 mg total) by mouth daily. Evonnie Lenis, MD  Active Self, Pharmacy Records  feeding supplement (ENSURE ENLIVE / ENSURE PLUS) LIQD [id number]  Take 237 mLs by mouth 3 (three) times daily between meals.  Patient not taking: Reported on 05/22/2024   Pearlean Manus, MD  Active Self, Pharmacy Records           Med Note [place], CHUCK KANDICE Debar Mar 28, 2024  3:56 PM)    guaiFENesin  (ROBITUSSIN) 100 MG/5ML liquid [id number] Yes Take 10 mLs by mouth every 4 (four) hours as needed for cough or to loosen phlegm. [provider]  Active Self, Pharmacy Records  hydrOXYzine  (ATARAX ) 50 MG tablet [id number] Yes Take 50 mg by mouth 3 (three) times daily as needed. [provider]  Active Self, Pharmacy Records  ibuprofen  (ADVIL ) 200 MG tablet [id number] Yes Take 200 mg by mouth every 6 (six) hours as needed for moderate pain (pain score 4-6). [provider]  Active Self, Pharmacy Records  ipratropium-albuterol  (DUONEB) 0.5-2.5 (3) MG/3ML SOLN [id number] Yes Four times daily  Wert, Michael B, MD  Active Self, Pharmacy Records  methocarbamol  (ROBAXIN ) 500 MG tablet [id number]  Take 500 mg by mouth every 8 (eight) hours as needed for muscle spasms.  Patient not taking: Reported on 05/22/2024   [provider]  Active Self, Pharmacy Records  nicotine  (NICODERM CQ  - DOSED IN MG/24 HR) 7 mg/24hr patch [id number] Yes Place 1 patch (7 mg total) onto the skin daily. Darlean Ozell NOVAK, MD  Active Self, Pharmacy Records  oxyCODONE -acetaminophen  (PERCOCET/ROXICET) 5-325 MG tablet [id number] Yes Take 1-2 tablets by mouth every 6 (six) hours as needed for moderate pain (pain score 4-6) or severe pain (pain score 7-10). Maree Bracken D, DO  Active   OXYGEN [id number] Yes Inhale 3 L into the lungs continuous. [provider]  Active Self, Pharmacy Records  pantoprazole  (PROTONIX ) 40 MG tablet [id number]  Take 1 tablet (40 mg total) by mouth daily for 7 days.  Patient not taking: Reported on 05/22/2024   Maree Bracken D, DO  Expired 05/02/24 2359             Recommendation:   Continue Current Plan of Care  Follow Up Plan:   Telephone follow-up in 2 weeks  Rosina Forte, BSN RN [facility], [facility] RN Care Manager Direct Dial: [phone]  Fax: [phone]

## 2024-05-22 NOTE — Telephone Encounter (Signed)
 05/11/24 15 tabs/0 RF

## 2024-05-22 NOTE — Telephone Encounter (Unsigned)
 Copied from CRM [id number]. Topic: Clinical - Medication Refill >> May 22, 2024  9:38 AM Everette C wrote: Medication: ALPRAZolam  (XANAX ) 1 MG tablet  Has the patient contacted their pharmacy? Yes (Agent: If no, request that the patient contact the pharmacy for the refill. If patient does not wish to contact the pharmacy document the reason why and proceed with request.) (Agent: If yes, when and what did the pharmacy advise?)  This is the patient's preferred pharmacy:  [id number] - [place], [place] - [address] [place] [address] Phone: [phone] Fax: [phone]  Is this the correct pharmacy for this prescription? Yes If no, delete pharmacy and type the correct one.   Has the prescription been filled recently? Yes  Is the patient out of the medication? Yes  Has the patient been seen for an appointment in the last year OR does the patient have an upcoming appointment? Yes  Can we respond through MyChart? No  Agent: Please be advised that Rx refills may take up to 3 business days. We ask that you follow-up with your pharmacy.

## 2024-05-22 NOTE — Patient Instructions (Signed)
 Visit Information  Thank you for taking time to visit with me today. Please don't hesitate to contact me if I can be of assistance to you before our next scheduled appointment.  Our next appointment is by telephone on 06-05-2024 at 10:30 am Please call the care guide team at [phone] if you need to cancel or reschedule your appointment.   Following is a copy of your care plan:   Goals Addressed             This Visit's Progress    [facility] RN Care Plan: COPD       Problems:  Chronic Disease Management support and education needs related to COPD  Goal: Over the next 90 days the Patient will attend all scheduled medical appointments: with primary care provider and specialist as evidenced by keeping all scheduled appointments        continue to work with RN Care Manager and/or Social Worker to address care management and care coordination needs related to COPD as evidenced by adherence to care management team scheduled appointments     experience decrease in ED visits as evidenced by electronic medical record review; ED visits in last 6 months = 5 take all medications exactly as prescribed and will call provider for medication related questions as evidenced by compliance with all medications    verbalize basic understanding of COPD disease process and self health management plan as evidenced by verbal explanation, recognizing/monitoring symptoms, lifestyle changes  Interventions:   COPD Interventions: Advised patient to engage in light exercise as tolerated 3-5 days a week to aid in the the management of COPD Advised patient to track and manage COPD triggers Advised patient to self assesses COPD action plan zone and make appointment with provider if in the yellow zone for 48 hours without improvement Assessed social determinant of health barriers Discussed the importance of adequate rest and management of fatigue with COPD Provided education about and advised patient to utilize  infection prevention strategies to reduce risk of respiratory infection Provided instruction about proper use of medications used for management of COPD including inhalers Provided patient with basic written and verbal COPD education on self care/management/and exacerbation prevention Provided written and verbal instructions on pursed lip breathing and utilized returned demonstration as teach back Screening for signs and symptoms of depression related to chronic disease state  Use of home oxygen  Patient Self-Care Activities:  Attend all scheduled provider appointments Attend church or other social activities Call pharmacy for medication refills 3-7 days in advance of running out of medications Call provider office for new concerns or questions  Perform all self care activities independently  Perform IADL's (shopping, preparing meals, housekeeping, managing finances) independently Take medications as prescribed   Work with the social worker to address care coordination needs and will continue to work with the clinical team to address health care and disease management related needs eliminate smoking in my home identify and remove indoor air pollutants do breathing exercises every day begin a symptom diary develop a rescue plan eliminate symptom triggers at home follow rescue plan if symptoms flare-up get at least 7 to 8 hours of sleep at night practice relaxation or meditation daily do breathing exercises every day do breathing exercises at least 2 times each day do exercises in a comfortable position that makes breathing as easy as possible  Plan:  Telephone follow up appointment with care management team member scheduled for:  06-05-2024 at 10:30 am  Please call the Suicide and Crisis Lifeline: 988 call the [place]  National Suicide Prevention Lifeline: (249)121-7490 or TTY: 626 522 5105 TTY (878)572-0669) to talk to a trained counselor call 1-800-273-TALK (toll  free, 24 hour hotline) call the [facility]: (519)031-8295 call 911 if you are experiencing a Mental Health or Behavioral Health Crisis or need someone to talk to.  The patient verbalized understanding of instructions, educational materials, and care plan provided today and agreed to receive a mailed copy of patient instructions, educational materials, and care plan.   Rosina Forte, BSN RN [facility], [facility] RN Care Manager Direct Dial: [phone]  Fax: [phone]

## 2024-05-23 ENCOUNTER — Telehealth: Payer: Self-pay

## 2024-05-23 ENCOUNTER — Other Ambulatory Visit: Payer: Self-pay | Admitting: Family Medicine

## 2024-05-23 DIAGNOSIS — F410 Panic disorder [episodic paroxysmal anxiety] without agoraphobia: Secondary | ICD-10-CM

## 2024-05-23 NOTE — Telephone Encounter (Signed)
 Copied from CRM [id number]. Topic: General - Other >> May 23, 2024  9:29 AM Treva T wrote: Reason for CRM: Patient calling to check refill status of medication, ALPRAZolam  (XANAX ) 1 MG tablet.  Per chart review, medication status is pending. Patient informed and aware can take up to 3 business days to process.  Patient is requesting a return call. Can be reached at [phone].  Thank you

## 2024-05-23 NOTE — Telephone Encounter (Signed)
 She is asking for another xanax  refill. Was just refilled 7/3 with a quantity of 15. Did not even last her 15. Days. Pls advise

## 2024-05-23 NOTE — Telephone Encounter (Unsigned)
 Copied from CRM [id number]. Topic: Clinical - Medication Refill >> May 23, 2024  9:39 AM Donee H wrote: Medication: ALPRAZolam  (XANAX ) 1 MG tablet   Has the patient contacted their pharmacy? Yes (This is the patient's preferred pharmacy:  [id number] - [place], [place] - [address] [place] [address] Phone: [phone] Fax: [phone]  Is this the correct pharmacy for this prescription? Yes  Has the prescription been filled recently? No  Is the patient out of the medication? Yes  Has the patient been seen for an appointment in the last year OR does the patient have an upcoming appointment? Yes  Can we respond through MyChart? Yes  Agent: Please be advised that Rx refills may take up to 3 business days. We ask that you follow-up with your pharmacy.

## 2024-05-24 ENCOUNTER — Telehealth: Payer: Self-pay

## 2024-05-24 ENCOUNTER — Ambulatory Visit: Payer: Self-pay

## 2024-05-24 MED ORDER — ALPRAZOLAM 1 MG PO TABS: 1.0000 mg | ORAL_TABLET | Freq: Two times a day (BID) | ORAL | 0 refills | Status: DC | PRN

## 2024-05-24 NOTE — Telephone Encounter (Signed)
 PULMONARY AND PRIMARY CARE ACTION NEEDED  FYI Only or Action Required?: Action required by provider: refusing hospital, needs call back with further recommendations.  Patient is followed in Pulmonology for COPD and respiratory failure with hypoxia, last seen on 04/06/2024 by Darlean Ozell NOVAK, MD.  Called Nurse Triage reporting Shortness of Breath, Anxiety, Medication Refill, Hyperventilating, cough to clear sputum, and Wheezing.  Symptoms began today.  Interventions attempted: Rescue inhaler, Nebulizer treatments, and Home oxygen use.  Symptoms are: rapidly worsening.  Triage Disposition: Call EMS 911 Now  Patient/caregiver understands and will follow disposition?: No, refuses disposition     Copied from CRM [id number]. Topic: Clinical - Red Word Triage >> May 24, 2024 12:14 PM Casey [name] wrote: Red Word that prompted transfer to Nurse Triage: Anxiety, difficulty breathing. Waiting on Xanax  refill  I see this was ordered on 05/11/24 for 15 tabs 2 times daily as needed today from CMA Miriam Gentry Reason for Disposition  Sounds like a life-threatening emergency to the triager  Answer Assessment - Initial Assessment Questions 1. RESPIRATORY STATUS: Describe your breathing? (e.g., wheezing, shortness of breath, unable to speak, severe coughing)      Cough Wheezing as well over phone SOB is worse than usual That's why anxious No dizziness or weakness more than usual Feel very excited because need nerve medicine, very stressed out, when can't breathe, get very stressed out Nebulizer and inhaler around 10-10:30, some relief Anxiety again and getting nervous Sitting, SOB while talking to you Rapid mouth breathing Coughing up clear sputum Oxygen 24/7 3L, turned it up to 4L before then back down to 3L but still just feel so anxiety Sounds very labored over phone Speaking in single words to phrases  2. ONSET: When did this breathing problem begin?      Just today  3. PATTERN  Does the difficult breathing come and go, or has it been constant since it started?      Coming and going  4. SEVERITY: How bad is your breathing? (e.g., mild, moderate, severe)      States mild for her, can still get up to go to bathroom  6. CARDIAC HISTORY: Do you have any history of heart disease? (e.g., heart attack, angina, bypass surgery, angioplasty)      Denies  7. LUNG HISTORY: Do you have any history of lung disease?  (e.g., pulmonary embolus, asthma, emphysema)     COPD  9. OTHER SYMPTOMS: Do you have any other symptoms? (e.g., chest pain, cough, dizziness, fever, runny nose)     No fever, runny nose, or chest pain  10. O2 SATURATION MONITOR:  Do you use an oxygen saturation monitor (pulse oximeter) at home? If Yes, ask: What is your reading (oxygen level) today? What is your usual oxygen saturation reading? (e.g., 95%)       93%, usually 95%, 96% right now  Advised hospital right away for severity of SOB Ain't going to hospital    Advised hospital right away for severity of SOB, pt refusing, advised that sending message to provider for asap call back with further recommendations and feedback about refill request for xanax . Advised pt use her nebulizer/inhaler for some relief, advised titrate oxygen as she's been instructed previously. Advised pt call back or seek immediate care if worsening. Alerted CAL to ED refusal.  Protocols used: Breathing Difficulty-A-AH

## 2024-05-24 NOTE — Telephone Encounter (Signed)
 PRIMARY CARE AND PULMONARY ACTION NEEDED  FYI Only or Action Required?: Action required by provider: medication refill request. Requesting asap refill for xanax   Patient was last seen in primary care on 04/17/2024 by Zarwolo, Gloria, FNP.  Called Nurse Triage reporting Shortness of Breath, Anxiety, Medication Refill, Hyperventilating, cough to clear sputum, and Wheezing.  Symptoms began today.  Interventions attempted: Prescription medications: inhaler/nebulizer, home oxygen use.  Symptoms are: rapidly worsening.  Triage Disposition: Call EMS 911 Now  Patient/caregiver understands and will follow disposition?: No, refuses disposition

## 2024-05-24 NOTE — Telephone Encounter (Signed)
 Patient has missed three appointments in rolling 60-month period. Per no show policy, patient can be considered for dismissal.  Alternatively, registrars can contact patient to reschedule missed appointment and communicate NS policy. Please advise.

## 2024-05-24 NOTE — Telephone Encounter (Signed)
 Copied from CRM [id number]. Topic: Clinical - Medication Question >> May 24, 2024  8:57 AM Aleatha C wrote: Reason for CRM: Patient is calling to ask why she hasn't received her medication ALPRAZolam  (XANAX ) 1 MG tablet and would like a call back

## 2024-05-24 NOTE — Telephone Encounter (Signed)
 FYI pt is having anxiety attack with SOB and awaiting rx refill for Xanax  that we do not prescribe I do see a note to her PCP for refill. Advise on any other recommendations. She was already directed to ED but refusing

## 2024-05-24 NOTE — Telephone Encounter (Signed)
 I see this was ordered on 05/11/24 for 15 tabs 2 times daily as needed

## 2024-05-25 ENCOUNTER — Other Ambulatory Visit: Payer: Self-pay | Admitting: Family Medicine

## 2024-05-25 ENCOUNTER — Telehealth: Payer: Self-pay

## 2024-05-25 NOTE — Telephone Encounter (Signed)
 It's too early for a refill, this medication was last refilled on 7/3. She will need to wait for Meade, who will be back tomorrow

## 2024-05-25 NOTE — Telephone Encounter (Signed)
 Copied from CRM [id number]. Topic: General - Other >> May 24, 2024  4:19 PM Santiya F wrote: Reason for CRM: Patient is calling in requesting to speak with her provider. Informed patient that provider was out of the office. She is wanting to know if another provider can send in her Xanax . Patient says she has been out almost a week and needs it. Patient has spoken with nurses and refused ED per their recommendation. Patient is requesting someone reach out to her.

## 2024-05-25 NOTE — Telephone Encounter (Signed)
 Message was routed to provider in pulmonary yesterday so is in his box to review, I can't speak for PCP

## 2024-05-28 NOTE — Telephone Encounter (Signed)
 Kindly dismiss the patient in accordance with clinic policy.

## 2024-05-29 ENCOUNTER — Other Ambulatory Visit: Payer: Self-pay

## 2024-05-29 ENCOUNTER — Inpatient Hospital Stay (HOSPITAL_COMMUNITY)
Admission: EM | Admit: 2024-05-29 | Discharge: 2024-05-31 | DRG: 190 | Disposition: A | Discharge status: Home / Self Care | Attending: Family Medicine | Admitting: Family Medicine

## 2024-05-29 ENCOUNTER — Encounter: Payer: Self-pay | Admitting: Family Medicine

## 2024-05-29 ENCOUNTER — Encounter (HOSPITAL_COMMUNITY): Payer: Self-pay | Admitting: Emergency Medicine

## 2024-05-29 ENCOUNTER — Emergency Department (HOSPITAL_COMMUNITY)

## 2024-05-29 DIAGNOSIS — J440 Chronic obstructive pulmonary disease with acute lower respiratory infection: Secondary | ICD-10-CM | POA: Diagnosis present

## 2024-05-29 DIAGNOSIS — Z808 Family history of malignant neoplasm of other organs or systems: Secondary | ICD-10-CM

## 2024-05-29 DIAGNOSIS — Z825 Family history of asthma and other chronic lower respiratory diseases: Secondary | ICD-10-CM

## 2024-05-29 DIAGNOSIS — F32A Depression, unspecified: Secondary | ICD-10-CM | POA: Diagnosis present

## 2024-05-29 DIAGNOSIS — Z888 Allergy status to other drugs, medicaments and biological substances status: Secondary | ICD-10-CM

## 2024-05-29 DIAGNOSIS — J9611 Chronic respiratory failure with hypoxia: Secondary | ICD-10-CM | POA: Diagnosis present

## 2024-05-29 DIAGNOSIS — Z818 Family history of other mental and behavioral disorders: Secondary | ICD-10-CM

## 2024-05-29 DIAGNOSIS — Z720 Tobacco use: Secondary | ICD-10-CM | POA: Diagnosis present

## 2024-05-29 DIAGNOSIS — D649 Anemia, unspecified: Secondary | ICD-10-CM | POA: Diagnosis present

## 2024-05-29 DIAGNOSIS — J441 Chronic obstructive pulmonary disease with (acute) exacerbation: Principal | ICD-10-CM | POA: Diagnosis present

## 2024-05-29 DIAGNOSIS — J679 Hypersensitivity pneumonitis due to unspecified organic dust: Secondary | ICD-10-CM | POA: Diagnosis present

## 2024-05-29 DIAGNOSIS — Z7951 Long term (current) use of inhaled steroids: Secondary | ICD-10-CM

## 2024-05-29 DIAGNOSIS — G8929 Other chronic pain: Secondary | ICD-10-CM | POA: Diagnosis present

## 2024-05-29 DIAGNOSIS — Z9981 Dependence on supplemental oxygen: Secondary | ICD-10-CM

## 2024-05-29 DIAGNOSIS — J219 Acute bronchiolitis, unspecified: Secondary | ICD-10-CM | POA: Diagnosis present

## 2024-05-29 DIAGNOSIS — Z833 Family history of diabetes mellitus: Secondary | ICD-10-CM

## 2024-05-29 DIAGNOSIS — Z8249 Family history of ischemic heart disease and other diseases of the circulatory system: Secondary | ICD-10-CM

## 2024-05-29 DIAGNOSIS — J189 Pneumonia, unspecified organism: Secondary | ICD-10-CM | POA: Diagnosis present

## 2024-05-29 DIAGNOSIS — Z79899 Other long term (current) drug therapy: Secondary | ICD-10-CM

## 2024-05-29 DIAGNOSIS — Z8349 Family history of other endocrine, nutritional and metabolic diseases: Secondary | ICD-10-CM

## 2024-05-29 DIAGNOSIS — Z820 Family history of epilepsy and other diseases of the nervous system: Secondary | ICD-10-CM

## 2024-05-29 DIAGNOSIS — Z6820 Body mass index (BMI) 20.0-20.9, adult: Secondary | ICD-10-CM

## 2024-05-29 DIAGNOSIS — Z96612 Presence of left artificial shoulder joint: Secondary | ICD-10-CM | POA: Diagnosis present

## 2024-05-29 DIAGNOSIS — Z1152 Encounter for screening for COVID-19: Secondary | ICD-10-CM

## 2024-05-29 DIAGNOSIS — Z823 Family history of stroke: Secondary | ICD-10-CM

## 2024-05-29 DIAGNOSIS — E43 Unspecified severe protein-calorie malnutrition: Secondary | ICD-10-CM | POA: Diagnosis present

## 2024-05-29 DIAGNOSIS — J9621 Acute and chronic respiratory failure with hypoxia: Secondary | ICD-10-CM | POA: Diagnosis present

## 2024-05-29 DIAGNOSIS — M549 Dorsalgia, unspecified: Secondary | ICD-10-CM | POA: Diagnosis present

## 2024-05-29 LAB — RESPIRATORY PANEL BY PCR

## 2024-05-29 LAB — BASIC METABOLIC PANEL WITH GFR
Anion gap: 8 (ref 5–15)
BUN: 8 mg/dL (ref 8–23)
CO2: 33 mmol/L — ABNORMAL HIGH (ref 22–32)
Calcium: 8.4 mg/dL — ABNORMAL LOW (ref 8.9–10.3)
Chloride: 97 mmol/L — ABNORMAL LOW (ref 98–111)
Creatinine, Ser: 0.58 mg/dL (ref 0.44–1.00)
GFR, Estimated: >60 mL/min (ref >60)
Glucose, Bld: 114 mg/dL — ABNORMAL HIGH (ref 70–99)
Potassium: 3.6 mmol/L (ref 3.5–5.1)
Sodium: 138 mmol/L (ref 135–145)

## 2024-05-29 LAB — CBC
HCT: 34.9 % — ABNORMAL LOW (ref 36.0–46.0)
Hemoglobin: 10.9 g/dL — ABNORMAL LOW (ref 12.0–15.0)
MCH: 28.7 pg (ref 26.0–34.0)
MCHC: 31.2 g/dL (ref 30.0–36.0)
MCV: 91.8 fL (ref 80.0–100.0)
Platelets: 367 K/uL (ref 150–400)
RBC: 3.8 MIL/uL — ABNORMAL LOW (ref 3.87–5.11)
RDW: 13.9 % (ref 11.5–15.5)
WBC: 16.7 K/uL — ABNORMAL HIGH (ref 4.0–10.5)
nRBC: 0 % (ref 0.0–0.2)

## 2024-05-29 LAB — RESP PANEL BY RT-PCR (RSV, FLU A&B, COVID)  RVPGX2
Influenza A by PCR: NEGATIVE
Influenza B by PCR: NEGATIVE
Resp Syncytial Virus by PCR: NEGATIVE
SARS Coronavirus 2 by RT PCR: NEGATIVE

## 2024-05-29 LAB — BRAIN NATRIURETIC PEPTIDE: B Natriuretic Peptide: 16 pg/mL (ref 0.0–100.0)

## 2024-05-29 LAB — RAPID URINE DRUG SCREEN, HOSP PERFORMED
Amphetamines: NOT DETECTED
Barbiturates: NOT DETECTED
Benzodiazepines: POSITIVE — AB
Cocaine: NOT DETECTED
Opiates: NOT DETECTED
Tetrahydrocannabinol: NOT DETECTED

## 2024-05-29 LAB — TROPONIN I (HIGH SENSITIVITY): Troponin I (High Sensitivity): 3 ng/L (ref <18)

## 2024-05-29 LAB — D-DIMER, QUANTITATIVE: D-Dimer, Quant: 0.58 ug{FEU}/mL — ABNORMAL HIGH (ref 0.00–0.50)

## 2024-05-29 MED ORDER — GUAIFENESIN 100 MG/5ML PO LIQD: 10.0000 mL | ORAL | Status: DC | PRN

## 2024-05-29 MED ORDER — OXYCODONE-ACETAMINOPHEN 5-325 MG PO TABS
1.0000 | ORAL_TABLET | Freq: Once | ORAL | Status: AC
  Administered 2024-05-29: 1 via ORAL
  Filled 2024-05-29: qty 1

## 2024-05-29 MED ORDER — ENOXAPARIN SODIUM 40 MG/0.4ML IJ SOSY
40.0000 mg | PREFILLED_SYRINGE | INTRAMUSCULAR | Status: DC
  Administered 2024-05-29 – 2024-05-30 (×2): 40 mg via SUBCUTANEOUS
  Filled 2024-05-29 (×2): qty 0.4

## 2024-05-29 MED ORDER — ACETAMINOPHEN 325 MG PO TABS
650.0000 mg | ORAL_TABLET | Freq: Once | ORAL | Status: AC
  Administered 2024-05-29: 650 mg via ORAL
  Filled 2024-05-29: qty 2

## 2024-05-29 MED ORDER — LEVALBUTEROL HCL 1.25 MG/0.5ML IN NEBU
1.2500 mg | INHALATION_SOLUTION | Freq: Once | RESPIRATORY_TRACT | Status: AC
  Administered 2024-05-29: 1.25 mg via RESPIRATORY_TRACT
  Filled 2024-05-29: qty 0.5

## 2024-05-29 MED ORDER — ACETAMINOPHEN 325 MG PO TABS
650.0000 mg | ORAL_TABLET | Freq: Four times a day (QID) | ORAL | Status: DC | PRN
  Administered 2024-05-31: 650 mg via ORAL
  Filled 2024-05-29: qty 2

## 2024-05-29 MED ORDER — CITALOPRAM HYDROBROMIDE 20 MG PO TABS
20.0000 mg | ORAL_TABLET | Freq: Every day | ORAL | Status: DC
  Administered 2024-05-29 – 2024-05-31 (×3): 20 mg via ORAL
  Filled 2024-05-29 (×3): qty 1

## 2024-05-29 MED ORDER — IPRATROPIUM-ALBUTEROL 0.5-2.5 (3) MG/3ML IN SOLN
3.0000 mL | Freq: Once | RESPIRATORY_TRACT | Status: AC
Start: 2024-05-29 — End: 2024-05-29
  Administered 2024-05-29: 3 mL via RESPIRATORY_TRACT
  Filled 2024-05-29: qty 3

## 2024-05-29 MED ORDER — IPRATROPIUM-ALBUTEROL 0.5-2.5 (3) MG/3ML IN SOLN
3.0000 mL | Freq: Three times a day (TID) | RESPIRATORY_TRACT | Status: DC
  Administered 2024-05-30 – 2024-05-31 (×4): 3 mL via RESPIRATORY_TRACT
  Filled 2024-05-29 (×6): qty 3

## 2024-05-29 MED ORDER — OXYCODONE-ACETAMINOPHEN 5-325 MG PO TABS
1.0000 | ORAL_TABLET | Freq: Four times a day (QID) | ORAL | Status: DC | PRN
  Administered 2024-05-29: 2 via ORAL
  Administered 2024-05-30 (×2): 1 via ORAL
  Administered 2024-05-30 – 2024-05-31 (×3): 2 via ORAL
  Filled 2024-05-29: qty 2
  Filled 2024-05-29: qty 1
  Filled 2024-05-29: qty 2
  Filled 2024-05-29: qty 1
  Filled 2024-05-29 (×2): qty 2

## 2024-05-29 MED ORDER — DEXAMETHASONE SODIUM PHOSPHATE 4 MG/ML IJ SOLN
4.0000 mg | Freq: Two times a day (BID) | INTRAMUSCULAR | Status: DC
  Administered 2024-05-29 – 2024-05-31 (×4): 4 mg via INTRAVENOUS
  Filled 2024-05-29 (×4): qty 1

## 2024-05-29 MED ORDER — MAGNESIUM SULFATE 2 GM/50ML IV SOLN
2.0000 g | Freq: Once | INTRAVENOUS | Status: AC
  Administered 2024-05-29: 2 g via INTRAVENOUS
  Filled 2024-05-29: qty 50

## 2024-05-29 MED ORDER — PREDNISONE 50 MG PO TABS
60.0000 mg | ORAL_TABLET | Freq: Once | ORAL | Status: AC
  Administered 2024-05-29: 60 mg via ORAL
  Filled 2024-05-29: qty 1

## 2024-05-29 MED ORDER — SODIUM CHLORIDE 0.9 % IV SOLN
1.0000 g | Freq: Once | INTRAVENOUS | Status: AC
  Administered 2024-05-29: 1 g via INTRAVENOUS
  Filled 2024-05-29: qty 10

## 2024-05-29 MED ORDER — IPRATROPIUM-ALBUTEROL 0.5-2.5 (3) MG/3ML IN SOLN
3.0000 mL | Freq: Four times a day (QID) | RESPIRATORY_TRACT | Status: DC
  Administered 2024-05-29 (×2): 3 mL via RESPIRATORY_TRACT
  Filled 2024-05-29: qty 3

## 2024-05-29 MED ORDER — BUDESONIDE 0.25 MG/2ML IN SUSP
0.2500 mg | Freq: Two times a day (BID) | RESPIRATORY_TRACT | Status: DC
  Administered 2024-05-29: 0.25 mg via RESPIRATORY_TRACT
  Filled 2024-05-29: qty 2

## 2024-05-29 MED ORDER — ALPRAZOLAM 0.5 MG PO TABS
0.5000 mg | ORAL_TABLET | Freq: Once | ORAL | Status: AC
  Administered 2024-05-29: 0.5 mg via ORAL
  Filled 2024-05-29: qty 1

## 2024-05-29 MED ORDER — HYDROXYZINE HCL 25 MG PO TABS: 50.0000 mg | ORAL_TABLET | Freq: Three times a day (TID) | ORAL | Status: DC | PRN

## 2024-05-29 MED ORDER — ALPRAZOLAM 1 MG PO TABS
1.0000 mg | ORAL_TABLET | Freq: Two times a day (BID) | ORAL | Status: DC | PRN
  Administered 2024-05-29 – 2024-05-30 (×2): 1 mg via ORAL
  Filled 2024-05-29 (×2): qty 1

## 2024-05-29 MED ORDER — ALBUTEROL SULFATE (2.5 MG/3ML) 0.083% IN NEBU: 2.5000 mg | INHALATION_SOLUTION | RESPIRATORY_TRACT | Status: DC | PRN

## 2024-05-29 MED ORDER — ACETAMINOPHEN 650 MG RE SUPP: 650.0000 mg | Freq: Four times a day (QID) | RECTAL | Status: DC | PRN

## 2024-05-29 MED ORDER — BUDESONIDE 0.25 MG/2ML IN SUSP
0.2500 mg | Freq: Two times a day (BID) | RESPIRATORY_TRACT | Status: DC
  Administered 2024-05-30 – 2024-05-31 (×3): 0.25 mg via RESPIRATORY_TRACT
  Filled 2024-05-29 (×3): qty 2

## 2024-05-29 MED ORDER — SODIUM CHLORIDE 0.9 % IV SOLN: 1.0000 g | Freq: Once | INTRAVENOUS | Status: DC

## 2024-05-29 NOTE — ED Provider Notes (Signed)
 [place] [facility]MENT AT [facility] Provider Note   CSN: [id number] Arrival date & time: 05/29/24  9152     Patient presents with: Shortness of Breath   Casey [name] is a 63 y.o. female with with a history most significant for COPD on 4 L nasal cannula at home presenting with increased shortness of breath, wheezing and midsternal chest pressure which is triggered by cough episodes.  She typically coughs daily but her cough has become more productive of a yellow sputum.  She has a history of pneumonia and is concerned about recurrence.  She has had a subjective low-grade fever at home.  She has been compliant with her home medications although she has not taken her morning oxycodone  or Xanax , oxycodone  for chronic back pain this morning and request these medications now.  Her last treatment with albuterol  per nebulizer was taken just prior to arrival.  She denies nausea or vomiting, fevers, she does endorse generalized headache.  She was noted to be tachycardic although as noted had chest taken her albuterol  prior to arrival.  Denies leg pain or swelling.  Patient no longer smokes cigarettes.  Also denies any further substance abuse, specifically no cocaine abuse.   The history is provided by the patient.       Prior to Admission medications   Medication Sig Start Date End Date Taking? Authorizing Provider  acetaminophen  (TYLENOL ) 325 MG tablet Take 2 tablets (650 mg total) by mouth every 6 (six) hours as needed for mild pain (or Fever >/= 101). Patient not taking: Reported on 05/22/2024 08/17/23   Pearlean Manus, MD  albuterol  (VENTOLIN  HFA) 108 (90 Base) MCG/ACT inhaler Inhale 2 puffs into the lungs every 4 (four) hours as needed for wheezing or shortness of breath. 03/30/24   Johnson, Clanford L, MD  ALPRAZolam  (XANAX ) 1 MG tablet Take 1 tablet (1 mg total) by mouth 2 (two) times daily as needed for anxiety. 05/24/24   Bevely Doffing, FNP  budesonide  (PULMICORT ) 0.25  MG/2ML nebulizer solution Take 2 mLs (0.25 mg total) by nebulization 2 (two) times daily. 04/04/24   Bryn Bernardino KATHEE, MD  citalopram  (CELEXA ) 20 MG tablet Take 1 tablet (20 mg total) by mouth daily. 03/22/24   Evonnie Lenis, MD  feeding supplement (ENSURE ENLIVE / ENSURE PLUS) LIQD Take 237 mLs by mouth 3 (three) times daily between meals. Patient not taking: Reported on 05/22/2024 08/17/23   Pearlean Manus, MD  guaiFENesin  (ROBITUSSIN) 100 MG/5ML liquid Take 10 mLs by mouth every 4 (four) hours as needed for cough or to loosen phlegm.    [provider]  hydrOXYzine  (ATARAX ) 50 MG tablet Take 50 mg by mouth 3 (three) times daily as needed. 04/08/24   [provider]  ibuprofen  (ADVIL ) 200 MG tablet Take 200 mg by mouth every 6 (six) hours as needed for moderate pain (pain score 4-6).    [provider]  ipratropium-albuterol  (DUONEB) 0.5-2.5 (3) MG/3ML SOLN Four times daily 04/06/24   Wert, Michael B, MD  methocarbamol  (ROBAXIN ) 500 MG tablet Take 500 mg by mouth every 8 (eight) hours as needed for muscle spasms. Patient not taking: Reported on 05/22/2024    [provider]  nicotine  (NICODERM CQ  - DOSED IN MG/24 HR) 7 mg/24hr patch Place 1 patch (7 mg total) onto the skin daily. 04/06/24   Darlean Ozell KATHEE, MD  oxyCODONE -acetaminophen  (PERCOCET/ROXICET) 5-325 MG tablet Take 1-2 tablets by mouth every 6 (six) hours as needed for moderate pain (pain  score 4-6) or severe pain (pain score 7-10). 04/24/24   Maree, Pratik D, DO  OXYGEN Inhale 3 L into the lungs continuous.    [provider]  pantoprazole  (PROTONIX ) 40 MG tablet Take 1 tablet (40 mg total) by mouth daily for 7 days. Patient not taking: Reported on 05/22/2024 04/25/24 05/02/24  Maree Bracken D, DO    Allergies: Methylprednisolone     Review of Systems  Constitutional:  Negative for chills and fever.  HENT:  Negative for congestion.   Eyes: Negative.   Respiratory:  Positive for cough, chest tightness and  shortness of breath.   Cardiovascular:  Positive for chest pain. Negative for leg swelling.  Gastrointestinal:  Negative for abdominal pain and nausea.  Genitourinary: Negative.   Musculoskeletal:  Positive for back pain. Negative for arthralgias, joint swelling and neck pain.       Endorses back pain is chronic  Skin: Negative.  Negative for rash and wound.  Neurological:  Positive for headaches. Negative for dizziness, weakness, light-headedness and numbness.  Psychiatric/Behavioral: Negative.      Updated Vital Signs BP (!) 131/105   Pulse (!) 115   Temp 99.4 F (37.4 C) (Oral)   Resp (!) 25   Ht 5' 5 (1.651 m)   Wt 55.9 kg   LMP  (LMP Unknown)   SpO2 94%   BMI 20.51 kg/m   Physical Exam Vitals and nursing note reviewed.  Constitutional:      Appearance: She is well-developed.  HENT:     Head: Normocephalic and atraumatic.  Eyes:     Conjunctiva/sclera: Conjunctivae normal.  Cardiovascular:     Rate and Rhythm: Normal rate and regular rhythm.     Heart sounds: Normal heart sounds.  Pulmonary:     Effort: Pulmonary effort is normal.     Breath sounds: Decreased breath sounds and wheezing present.     Comments: Significant wheezing and reduced aeration throughout all lung fields, prolonged expirations. Abdominal:     General: Bowel sounds are normal.     Palpations: Abdomen is soft.     Tenderness: There is no abdominal tenderness.  Musculoskeletal:        General: Normal range of motion.     Cervical back: Normal range of motion.     Right lower leg: No edema.     Left lower leg: No edema.  Skin:    General: Skin is warm and dry.  Neurological:     Mental Status: She is alert.     (all labs ordered are listed, but only abnormal results are displayed) Labs Reviewed  BASIC METABOLIC PANEL WITH GFR - Abnormal; Notable for the following components:      Result Value   Chloride 97 (*)    CO2 33 (*)    Glucose, Bld 114 (*)    Calcium  8.4 (*)    All other  components within normal limits  CBC - Abnormal; Notable for the following components:   WBC 16.7 (*)    RBC 3.80 (*)    Hemoglobin 10.9 (*)    HCT 34.9 (*)    All other components within normal limits  D-DIMER, QUANTITATIVE - Abnormal; Notable for the following components:   D-Dimer, Quant 0.58 (*)    All other components within normal limits  RESP PANEL BY RT-PCR (RSV, FLU A&B, COVID)  RVPGX2  BRAIN NATRIURETIC PEPTIDE  RAPID URINE DRUG SCREEN, HOSP PERFORMED  TROPONIN I (HIGH SENSITIVITY)    EKG: EKG Interpretation Date/Time:  Monday May 29 2024 08:57:11 EDT Ventricular Rate:  117 PR Interval:  177 QRS Duration:  83 QT Interval:  314 QTC Calculation: 438 R Axis:   20  Text Interpretation: Sinus tachycardia No acute changes normal intervals No significant change since last tracing Confirmed by Charlyn Sora [phone]) on 05/29/2024 8:59:22 AM  Radiology: [facility] Chest Portable 1 View Result Date: 05/29/2024 CLINICAL DATA:  Cough, chest discomfort, productive sputum. Home oxygen therapy. EXAM: PORTABLE CHEST 1 VIEW COMPARISON:  05/03/2024 FINDINGS: The patient is rotated to the right on today's radiograph, reducing diagnostic sensitivity and specificity. Emphysema. Mild interstitial accentuation with some scattered hazy opacities in both lungs partially corroborating with some patchy ground-glass opacities shown on the CT scan of 03/31/2024. Entity such as extrinsic allergic alveolitis or bronchiolitis obliterans not excluded. No overt airspace opacity. Left rib deformities from prior fractures. Indistinct nodularity at the right lung base. Reverse left shoulder arthroplasty. IMPRESSION: 1. Mild interstitial accentuation with some scattered hazy opacities in both lungs partially corroborating with some patchy ground-glass opacities shown on the CT scan of 03/31/2024. Entity such as extrinsic allergic alveolitis or bronchiolitis obliterans not excluded. 2. Emphysema. Electronically Signed   By:  Ryan Salvage M.D.   On: 05/29/2024 09:25     Procedures   Medications Ordered in the ED  magnesium  sulfate IVPB 2 g 50 mL (2 g Intravenous New Bag/Given 05/29/24 1048)  cefTRIAXone  (ROCEPHIN ) 1 g in sodium chloride  0.9 % 100 mL IVPB (has no administration in time range)  ipratropium-albuterol  (DUONEB) 0.5-2.5 (3) MG/3ML nebulizer solution 3 mL (3 mLs Nebulization Given 05/29/24 0938)  oxyCODONE -acetaminophen  (PERCOCET/ROXICET) 5-325 MG per tablet 1 tablet (1 tablet Oral Given 05/29/24 9077)  predniSONE  (DELTASONE ) tablet 60 mg (60 mg Oral Given 05/29/24 9077)  ALPRAZolam  (XANAX ) tablet 0.5 mg (0.5 mg Oral Given 05/29/24 9077)  acetaminophen  (TYLENOL ) tablet 650 mg (650 mg Oral Given 05/29/24 1047)  levalbuterol  (XOPENEX ) nebulizer solution 1.25 mg (1.25 mg Nebulization Given 05/29/24 1036)                                    Medical Decision Making Patient with advanced COPD on 4 L nasal cannula chronically presenting with increased shortness of breath, cough, wheezing not responding to her home treatments.  Endorses anterior chest pain triggered by coughing, cough has been productive of a yellow sputum, patient is afebrile but she is at high risk for pneumonia, she will be covered for lung infection, she cannot tolerate methylprednisolone , therefore oral prednisone  was given, she has not responded to albuterol /Atrovent  neb but remains tachycardic.  Dose of Xopenex  has been ordered.  Patient will require admission.  She does have a significant leukocytosis at 16.  7.  Chest x-ray negative for Community-acquired pneumonia there is some patchy ground glass opacities which were previously shown on CT imaging as a chronic finding.  Amount and/or Complexity of Data Reviewed Labs: ordered.    Details: Significant labs including a CO2 of 33, her D-dimer is age-adjusted normal range at 0.58.  She does have a significant leukocytosis with a WBC count of 16.7.  Her hemoglobin is 10.9, which has been  fluctuating 1 month ago was 10.5.  Her BNP is negative. Radiology: ordered.    Details: Chest x-ray negative for CAP, bronchiolitis obliterans not excluded but similar presentation to CT chest dated 03/31/2024. ECG/medicine tests: ordered.    Details: Sinus tachycardia at 117.  Suspect secondary  to neb treatments. Discussion of management or test interpretation with external provider(s): Patient discussed with Dr. Bryn who accept patient for admission.  Risk Decision regarding hospitalization.        Final diagnoses:  COPD exacerbation [facility])    ED Discharge Orders     None          Casey [name] 05/29/24 1051    Charlyn Sora, MD 05/30/24 [phone]
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
## 2024-05-29 NOTE — Plan of Care (Signed)

## 2024-05-29 NOTE — Plan of Care (Signed)

## 2024-05-29 NOTE — Telephone Encounter (Signed)
 Chart updated and letter sent

## 2024-05-29 NOTE — ED Triage Notes (Addendum)
 Pt tot he ED via [facility] from home with complaints of yellow sputum and chest discomfort with coughing. Pt is a chronically on 4L of oxygen at home.Pt states she quit smoking 3 weeks ago.

## 2024-05-30 DIAGNOSIS — Z96612 Presence of left artificial shoulder joint: Secondary | ICD-10-CM | POA: Diagnosis present

## 2024-05-30 DIAGNOSIS — Z888 Allergy status to other drugs, medicaments and biological substances status: Secondary | ICD-10-CM | POA: Diagnosis not present

## 2024-05-30 DIAGNOSIS — E43 Unspecified severe protein-calorie malnutrition: Secondary | ICD-10-CM | POA: Diagnosis present

## 2024-05-30 DIAGNOSIS — Z825 Family history of asthma and other chronic lower respiratory diseases: Secondary | ICD-10-CM | POA: Diagnosis not present

## 2024-05-30 DIAGNOSIS — Z6820 Body mass index (BMI) 20.0-20.9, adult: Secondary | ICD-10-CM | POA: Diagnosis not present

## 2024-05-30 DIAGNOSIS — Z9981 Dependence on supplemental oxygen: Secondary | ICD-10-CM | POA: Diagnosis not present

## 2024-05-30 DIAGNOSIS — J679 Hypersensitivity pneumonitis due to unspecified organic dust: Secondary | ICD-10-CM | POA: Diagnosis present

## 2024-05-30 DIAGNOSIS — Z820 Family history of epilepsy and other diseases of the nervous system: Secondary | ICD-10-CM | POA: Diagnosis not present

## 2024-05-30 DIAGNOSIS — J440 Chronic obstructive pulmonary disease with acute lower respiratory infection: Secondary | ICD-10-CM | POA: Diagnosis present

## 2024-05-30 DIAGNOSIS — Z8249 Family history of ischemic heart disease and other diseases of the circulatory system: Secondary | ICD-10-CM | POA: Diagnosis not present

## 2024-05-30 DIAGNOSIS — J441 Chronic obstructive pulmonary disease with (acute) exacerbation: Secondary | ICD-10-CM | POA: Diagnosis present

## 2024-05-30 DIAGNOSIS — M549 Dorsalgia, unspecified: Secondary | ICD-10-CM | POA: Diagnosis present

## 2024-05-30 DIAGNOSIS — Z7951 Long term (current) use of inhaled steroids: Secondary | ICD-10-CM | POA: Diagnosis not present

## 2024-05-30 DIAGNOSIS — Z833 Family history of diabetes mellitus: Secondary | ICD-10-CM | POA: Diagnosis not present

## 2024-05-30 DIAGNOSIS — F32A Depression, unspecified: Secondary | ICD-10-CM | POA: Diagnosis present

## 2024-05-30 DIAGNOSIS — G8929 Other chronic pain: Secondary | ICD-10-CM | POA: Diagnosis present

## 2024-05-30 DIAGNOSIS — D649 Anemia, unspecified: Secondary | ICD-10-CM | POA: Diagnosis present

## 2024-05-30 DIAGNOSIS — J219 Acute bronchiolitis, unspecified: Secondary | ICD-10-CM | POA: Diagnosis present

## 2024-05-30 DIAGNOSIS — Z818 Family history of other mental and behavioral disorders: Secondary | ICD-10-CM | POA: Diagnosis not present

## 2024-05-30 DIAGNOSIS — Z1152 Encounter for screening for COVID-19: Secondary | ICD-10-CM | POA: Diagnosis not present

## 2024-05-30 DIAGNOSIS — J189 Pneumonia, unspecified organism: Secondary | ICD-10-CM | POA: Diagnosis present

## 2024-05-30 DIAGNOSIS — Z8349 Family history of other endocrine, nutritional and metabolic diseases: Secondary | ICD-10-CM | POA: Diagnosis not present

## 2024-05-30 DIAGNOSIS — J9621 Acute and chronic respiratory failure with hypoxia: Secondary | ICD-10-CM | POA: Diagnosis present

## 2024-05-30 DIAGNOSIS — Z79899 Other long term (current) drug therapy: Secondary | ICD-10-CM | POA: Diagnosis not present

## 2024-05-30 MED ORDER — ADULT MULTIVITAMIN W/MINERALS CH
1.0000 | ORAL_TABLET | Freq: Every day | ORAL | Status: DC
  Administered 2024-05-30 – 2024-05-31 (×2): 1 via ORAL
  Filled 2024-05-30 (×2): qty 1

## 2024-05-30 MED ORDER — SODIUM CHLORIDE 0.9 % IV SOLN
1.0000 g | INTRAVENOUS | Status: DC
  Administered 2024-05-30: 1 g via INTRAVENOUS
  Filled 2024-05-30: qty 10

## 2024-05-30 MED ORDER — ALPRAZOLAM 1 MG PO TABS
1.0000 mg | ORAL_TABLET | Freq: Three times a day (TID) | ORAL | Status: DC | PRN
  Administered 2024-05-30 – 2024-05-31 (×3): 1 mg via ORAL
  Filled 2024-05-30 (×3): qty 1

## 2024-05-30 MED ORDER — ORAL CARE MOUTH RINSE: 15.0000 mL | OROMUCOSAL | Status: DC | PRN

## 2024-05-30 MED ORDER — AZITHROMYCIN 250 MG PO TABS
500.0000 mg | ORAL_TABLET | Freq: Once | ORAL | Status: AC
  Administered 2024-05-30: 500 mg via ORAL
  Filled 2024-05-30: qty 2

## 2024-05-30 MED ORDER — AZITHROMYCIN 250 MG PO TABS
250.0000 mg | ORAL_TABLET | Freq: Every day | ORAL | Status: DC
Start: 2024-05-31 — End: 2024-06-04
  Administered 2024-05-31: 250 mg via ORAL

## 2024-05-30 NOTE — Plan of Care (Signed)
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
## 2024-05-30 NOTE — Progress Notes (Signed)
 Initial Nutrition Assessment  DOCUMENTATION CODES:   Not applicable  INTERVENTION:   MVI with minerals daily.  NUTRITION DIAGNOSIS:   Increased nutrient needs related to chronic illness (COPD) as evidenced by estimated needs.  GOAL:   Patient will meet greater than or equal to 90% of their needs  MONITOR:   PO intake  REASON FOR ASSESSMENT:   Consult Assessment of nutrition requirement/status  ASSESSMENT:   63 yo female admitted with respiratory distress d/t AECOPD. PMH includes polysubstance abuse, chronic back pain, COPD on 3-4 L oxygen, tobacco use, SDH, anxiety.  Patient reports that she is eating very well at home and ate all of her breakfast today. She does not want any Ensure supplements at this time.   Per review of meal orders in [facility], patient is meeting 100% of her estimated nutrition needs at this time.   Patient is currently on a regular diet.  Meal intakes: 100% of breakfast today  Labs reviewed.  Medications reviewed and include decadron .  Weight history reviewed.  6% weight loss over the past 3 months. Patient has a hx of malnutrition, but this appears to be resolved with good intake at home per patient.   NUTRITION - FOCUSED PHYSICAL EXAM:  Flowsheet Row Most Recent Value  Orbital Region No depletion  Upper Arm Region No depletion  Thoracic and Lumbar Region No depletion  Buccal Region No depletion  Temple Region No depletion  Clavicle Bone Region Unable to assess  Clavicle and Acromion Bone Region No depletion  Scapular Bone Region No depletion  Dorsal Hand No depletion  Patellar Region Unable to assess  Anterior Thigh Region Unable to assess  Posterior Calf Region Unable to assess  Edema (RD Assessment) Unable to assess  Hair Reviewed  Eyes Reviewed  Mouth Reviewed  Skin Reviewed  Nails Reviewed    Diet Order:   Diet Order             Diet regular Fluid consistency: Thin  Diet effective now                    EDUCATION NEEDS:   No education needs have been identified at this time  Skin:  Skin Assessment: Reviewed RN Assessment  Last BM:  7/20  Height:   Ht Readings from Last 1 Encounters:  05/29/24 5' 5 (1.651 m)    Weight:   Wt Readings from Last 1 Encounters:  05/29/24 55.9 kg    Ideal Body Weight:  56.8 kg  BMI:  Body mass index is 20.51 kg/m.  Estimated Nutritional Needs:   Kcal:  1550-1750  Protein:  75-85 gm  Fluid:  >/= 1.5 L   Suzen HUNT RD, LDN, CNSC Contact via secure chat. If unavailable, use group chat RD Inpatient.
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
## 2024-05-31 DIAGNOSIS — J441 Chronic obstructive pulmonary disease with (acute) exacerbation: Secondary | ICD-10-CM | POA: Diagnosis not present

## 2024-05-31 MED ORDER — BUDESONIDE 0.25 MG/2ML IN SUSP
0.2500 mg | Freq: Two times a day (BID) | RESPIRATORY_TRACT | 4 refills | Status: DC
Start: 2024-05-31 — End: 2024-09-19

## 2024-05-31 MED ORDER — AZITHROMYCIN 500 MG PO TABS
500.0000 mg | ORAL_TABLET | Freq: Every day | ORAL | 0 refills | Status: AC
Start: 2024-05-31 — End: 2024-06-03

## 2024-05-31 MED ORDER — DM-GUAIFENESIN ER 30-600 MG PO TB12
1.0000 | ORAL_TABLET | Freq: Two times a day (BID) | ORAL | Status: DC
  Administered 2024-05-31: 1 via ORAL
  Filled 2024-05-31: qty 1

## 2024-05-31 MED ORDER — PREDNISONE 20 MG PO TABS
40.0000 mg | ORAL_TABLET | Freq: Every day | ORAL | 0 refills | Status: AC
Start: 2024-05-31 — End: 2024-06-05

## 2024-05-31 MED ORDER — IPRATROPIUM-ALBUTEROL 0.5-2.5 (3) MG/3ML IN SOLN: 3.0000 mL | Freq: Four times a day (QID) | RESPIRATORY_TRACT | 11 refills | Status: DC | PRN

## 2024-05-31 MED ORDER — PANTOPRAZOLE SODIUM 40 MG PO TBEC: 40.0000 mg | DELAYED_RELEASE_TABLET | Freq: Every day | ORAL | 5 refills | Status: DC

## 2024-05-31 MED ORDER — OXYCODONE-ACETAMINOPHEN 5-325 MG PO TABS
1.0000 | ORAL_TABLET | Freq: Four times a day (QID) | ORAL | Status: DC | PRN
  Administered 2024-05-31: 1 via ORAL
  Filled 2024-05-31: qty 1

## 2024-05-31 MED ORDER — ALBUTEROL SULFATE HFA 108 (90 BASE) MCG/ACT IN AERS: 2.0000 | INHALATION_SPRAY | RESPIRATORY_TRACT | 3 refills | Status: AC | PRN

## 2024-05-31 MED ORDER — DM-GUAIFENESIN ER 30-600 MG PO TB12: 1.0000 | ORAL_TABLET | Freq: Two times a day (BID) | ORAL | 0 refills | Status: DC

## 2024-05-31 NOTE — TOC Transition Note (Signed)
 Transition of Care [facility]) - Discharge Note   Patient Details  Name: MACALA BALDONADO MRN: [id number] Date of Birth: 1961-02-13  [facility]) CM/SW Contact:  Lucie Lunger, LCSWA Phone Number: 05/31/2024, 10:40 AM  Clinical Narrative:    CSW notes consult for transportation and food resources. CSW printed out [employer] and will provide to pt prior to hospital D/C. TOC to follow.   Final next level of care: Home/Self Care Barriers to Discharge: Barriers Resolved   Patient Goals and CMS Choice Patient states their goals for this hospitalization and ongoing recovery are:: return home CMS Medicare.gov Compare Post Acute Care list provided to:: Patient Choice offered to / list presented to : Patient      Discharge Placement                       Discharge Plan and Services Additional resources added to the After Visit Summary for   In-house Referral: Clinical Social Work   Post Acute Care Choice: Durable Medical Equipment            DME Agency: [employer]                  Social Drivers of Health (SDOH) Interventions SDOH Screenings   Food Insecurity: Food Insecurity Present (05/29/2024)  Housing: Low Risk  (05/29/2024)  Transportation Needs: Unmet Transportation Needs (05/29/2024)  Utilities: Not At Risk (05/29/2024)  Depression (PHQ2-9): High Risk (05/22/2024)  Social Connections: Unknown (05/29/2024)  Tobacco Use: Medium Risk (05/29/2024)     Readmission Risk Interventions    04/23/2024   10:13 AM 03/28/2024    8:07 PM 08/16/2023    2:16 PM  Readmission Risk Prevention Plan  Transportation Screening Complete Complete Complete  Medication Review Oceanographer) Complete Complete Complete  PCP or Specialist appointment within 3-5 days of discharge  Complete   HRI or Home Care Consult Complete Complete Complete  SW Recovery Care/Counseling Consult Complete Complete Complete  Palliative Care Screening Not Applicable Not Applicable  Not Applicable  Skilled Nursing Facility Not Applicable Complete Not Applicable
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
## 2024-05-31 NOTE — Care Management Important Message (Signed)
 Important Message  Patient Details  Name: Casey [name] MRN: [id number] Date of Birth: 1961-04-28   Important Message Given:  N/A - LOS <3 / Initial given by admissions     Casey [name] [name] 05/31/2024, 11:31 AM

## 2024-05-31 NOTE — Discharge Instructions (Signed)
 1)Complete Abstinence from Tobacco Advised --- you may use over-the-counter nicotine  patches to help with nicotine  cravings   2) please take the medications as prescribed---  3)you need oxygen at home at 3 to 4 L via nasal cannula continuously while awake and while asleep--- smoking or having open fires around oxygen can cause fire, significant injury and death  4) follow-up with Primary Care Provider ( Zarwolo, Meade, FNP) in 1 to 2 weeks for recheck and reevaluation

## 2024-06-01 ENCOUNTER — Telehealth: Payer: Self-pay

## 2024-06-01 ENCOUNTER — Other Ambulatory Visit: Payer: Self-pay | Admitting: Family Medicine

## 2024-06-01 ENCOUNTER — Other Ambulatory Visit: Payer: Self-pay

## 2024-06-01 DIAGNOSIS — F410 Panic disorder [episodic paroxysmal anxiety] without agoraphobia: Secondary | ICD-10-CM

## 2024-06-01 NOTE — Telephone Encounter (Signed)
 Copied from CRM #[id number]. Topic: Clinical - Medication Refill >> Jun 01, 2024  3:35 PM Charlet HERO wrote: Medication: ALPRAZolam  (XANAX ) 1 MG tablet  Has the patient contacted their pharmacy? No Controlled substance  This is the patient's preferred pharmacy:  [id number] - [place], [place] - [address] [place] [address] Phone: [phone] Fax: [phone]  Is this the correct pharmacy for this prescription? Yes If no, delete pharmacy and type the correct one.   Has the prescription been filled recently? Yes  Is the patient out of the medication? Yes  Has the patient been seen for an appointment in the last year OR does the patient have an upcoming appointment? Yes  Can we respond through MyChart? No  Agent: Please be advised that Rx refills may take up to 3 business days. We ask that you follow-up with your pharmacy.

## 2024-06-01 NOTE — Transitions of Care (Post Inpatient/ED Visit) (Signed)
   06/01/2024  Name: Casey [name] MRN: [id number] DOB: Sep 23, 1961  Today's TOC FU Call Status: Today's TOC FU Call Status:: Unsuccessful Call (1st Attempt) Unsuccessful Call (1st Attempt) Date: 06/01/24  Attempted to reach the patient regarding the most recent Inpatient/ED visit.  Follow Up Plan: Additional outreach attempts will be made to reach the patient to complete the Transitions of Care (Post Inpatient/ED visit) call.   Alan Ee, RN, BSN, CEN [employer]- Transition of Care Team.  [facility] [phone]

## 2024-06-01 NOTE — Transitions of Care (Post Inpatient/ED Visit) (Unsigned)
   06/01/2024  Name: Casey [name] MRN: [id number] DOB: Nov 18, 1960  Today's TOC FU Call Status: Today's TOC FU Call Status:: Successful TOC FU Call Completed TOC FU Call Complete Date: 06/01/24 Patient's Name and Date of Birth confirmed.  Transition Care Management Follow-up Telephone Call How have you been since you were released from the hospital?: Better Any questions or concerns?: No  Items Reviewed: Did you receive and understand the discharge instructions provided?: Yes  Reviewed with patient the reason for call. Patient states that she does not need to review her instructions. Offered X2 and patient declined.   Encouraged patient to follow up with PCP.     Follow up appointments reviewed: PCP Follow-up appointment confirmed?: No (reviewed importance.)  Patient declined to complete call. Assessment of medications not reviewed. Message sent to [facility] nurse Rosina Forte.    Alan Ee, RN, BSN, CEN [employer]- Transition of Care Team.  [facility] [phone]

## 2024-06-02 ENCOUNTER — Other Ambulatory Visit: Payer: Self-pay

## 2024-06-02 DIAGNOSIS — F410 Panic disorder [episodic paroxysmal anxiety] without agoraphobia: Secondary | ICD-10-CM

## 2024-06-05 ENCOUNTER — Telehealth: Admitting: *Deleted

## 2024-06-05 ENCOUNTER — Telehealth: Payer: Self-pay | Admitting: Family Medicine

## 2024-06-05 ENCOUNTER — Other Ambulatory Visit: Payer: Self-pay | Admitting: Family Medicine

## 2024-06-05 ENCOUNTER — Encounter: Payer: Self-pay | Admitting: *Deleted

## 2024-06-05 NOTE — Patient Instructions (Signed)
 Casey [name] - I am sorry I was unable to reach you today for our scheduled appointment. I work with Zarwolo, Gloria, FNP and am calling to support your healthcare needs. Please contact me at [phone] at your earliest convenience. I look forward to speaking with you soon.   Thank you,    Rosina Forte, BSN RN [facility], [facility] RN Care Manager Direct Dial: [phone]  Fax: [phone]

## 2024-06-05 NOTE — Telephone Encounter (Signed)
 Patient called after call hours need refill on Xanax , she is completely out.

## 2024-06-06 ENCOUNTER — Other Ambulatory Visit: Payer: Self-pay | Admitting: Family Medicine

## 2024-06-06 DIAGNOSIS — F410 Panic disorder [episodic paroxysmal anxiety] without agoraphobia: Secondary | ICD-10-CM

## 2024-06-06 NOTE — Telephone Encounter (Unsigned)
 Copied from CRM [id number]. Topic: Clinical - Medication Refill >> Jun 06, 2024 11:16 AM Tiffany S wrote: Medication: ALPRAZolam  (XANAX ) 1 MG tablet [[id number]]  Has the patient contacted their pharmacy? Yes (Agent: If no, request that the patient contact the pharmacy for the refill. If patient does not wish to contact the pharmacy document the reason why and proceed with request.) (Agent: If yes, when and what did the pharmacy advise?)  This is the patient's preferred pharmacy:  [id number] - [place], [place] - [address] [place] [address] Phone: [phone] Fax: [phone]  Is this the correct pharmacy for this prescription? Yes If no, delete pharmacy and type the correct one.   Has the prescription been filled recently? Yes  Is the patient out of the medication? Yes  Has the patient been seen for an appointment in the last year OR does the patient have an upcoming appointment? Yes  Can we respond through MyChart? Yes  Agent: Please be advised that Rx refills may take up to 3 business days. We ask that you follow-up with your pharmacy.

## 2024-06-07 ENCOUNTER — Other Ambulatory Visit: Payer: Self-pay | Admitting: Family Medicine

## 2024-06-07 DIAGNOSIS — F410 Panic disorder [episodic paroxysmal anxiety] without agoraphobia: Secondary | ICD-10-CM

## 2024-06-07 NOTE — Telephone Encounter (Unsigned)
 Copied from CRM [id number]. Topic: Clinical - Medication Refill >> Jun 07, 2024  4:48 PM Delon DASEN wrote: Medication: ALPRAZolam  (XANAX ) 1 MG tablet   Has the patient contacted their pharmacy? No (Agent: If no, request that the patient contact the pharmacy for the refill. If patient does not wish to contact the pharmacy document the reason why and proceed with request.) (Agent: If yes, when and what did the pharmacy advise?)  This is the patient's preferred pharmacy:  [id number] - [place], [place] - [address] [place] [address] Phone: [phone] Fax: [phone]  Is this the correct pharmacy for this prescription? Yes If no, delete pharmacy and type the correct one.   Has the prescription been filled recently? Yes  Is the patient out of the medication? Yes  Has the patient been seen for an appointment in the last year OR does the patient have an upcoming appointment? Yes  Can we respond through MyChart? Yes  Agent: Please be advised that Rx refills may take up to 3 business days. We ask that you follow-up with your pharmacy.

## 2024-06-08 ENCOUNTER — Other Ambulatory Visit: Payer: Self-pay

## 2024-06-08 DIAGNOSIS — F410 Panic disorder [episodic paroxysmal anxiety] without agoraphobia: Secondary | ICD-10-CM

## 2024-06-08 NOTE — Patient Outreach (Signed)
 Complex Care Management   Visit Note  06/08/2024  Name:  Casey [name] MRN: [id number] DOB: 07-Jun-1961  Situation: Referral received for Complex Care Management related to Billing/Medicaid issues I obtained verbal consent from Patient.  Visit completed with patient  on the phone  Background:   Past Medical History:  Diagnosis Date   Anxiety    Chronic back pain    COPD (chronic obstructive pulmonary disease) (HCC)    Depression    History of panic attacks 09/11/2014   Medical history non-contributory    Pneumonia    Polysubstance abuse (HCC)    History of taking non-prescribed opiates, BZDs; also +cocaine and THC   Ruptured disc, thoracic    Suicide attempt [facility]) 2002    Assessment:  Patient has billing information but has not submitted to DSS. Patient does plan to submit the information.  Patient does not request a follow up.  SDOH Interventions    Flowsheet Row ED to Hosp-Admission (Discharged) from 05/29/2024 in [facility] SURGICAL UNIT Patient Outreach Telephone from 05/22/2024 in [place] POPULATION HEALTH DEPARTMENT Telephone from 04/25/2024 in [place] POPULATION HEALTH DEPARTMENT Office Visit from 04/17/2024 in [facility] Primary Care Telephone from 04/05/2024 in [place] POPULATION HEALTH DEPARTMENT Office Visit from 02/24/2023 in [facility]  SDOH Interventions        Food Insecurity Interventions [employer] Resources Referral, Inpatient [facility] Community Resources Provided  [resources previously provided] Intervention Not Indicated  [family and friends bring food] -- Other (Comment)  [patient is with [employer] - $23/month food stamps - $1200/month SSI] --  Housing Interventions -- Intervention Not Indicated Intervention Not Indicated -- Intervention Not Indicated  [patient states house is paid for] --  Transportation Interventions [employer] Referral, Inpatient [facility]  [employer]) Intervention Not Indicated, Patient Resources (Friends/Family)  [she uses the medicaid bus to get to appoinments] -- Intervention Not Indicated  [patient states she utilizes Medicaid bus to get to appointments] --  Utilities Interventions -- Intervention Not Indicated Intervention Not Indicated -- Intervention Not Indicated --  Depression Interventions/Treatment  [employer] Referral Medication Currently on Treatment Currently on Treatment -- Currently on Treatment      Recommendation:   none  Follow Up Plan:   Patient has met all care management goals. Care Management case will be closed. Patient has been provided contact information should new needs arise.   Tillman Gardener, BSW   [facility] Social Worker Direct Dial: [phone]  Fax: [phone] Website: [email or web address]

## 2024-06-08 NOTE — Patient Instructions (Signed)

## 2024-06-09 NOTE — Telephone Encounter (Signed)
 Pt has been [employer]

## 2024-06-13 ENCOUNTER — Other Ambulatory Visit: Payer: Self-pay | Admitting: Family Medicine

## 2024-06-13 ENCOUNTER — Ambulatory Visit: Payer: Self-pay | Admitting: *Deleted

## 2024-06-13 DIAGNOSIS — F410 Panic disorder [episodic paroxysmal anxiety] without agoraphobia: Secondary | ICD-10-CM

## 2024-06-13 NOTE — Telephone Encounter (Signed)
 Copied from CRM [id number]. Topic: Clinical - Medication Refill >> Jun 13, 2024 11:08 AM Elle L wrote: Medication: ALPRAZolam  (XANAX ) 1 MG tablet AND hydrOXYzine  (ATARAX ) 50 MG tablet  Has the patient contacted their pharmacy? Yes  This is the patient's preferred pharmacy:  [id number] - [place], [place] - [address] [place] [address] Phone: [phone] Fax: [phone]  Is this the correct pharmacy for this prescription? Yes  Has the prescription been filled recently? Yes  Is the patient out of the medication? Yes  Has the patient been seen for an appointment in the last year OR does the patient have an upcoming appointment? Yes  Can we respond through MyChart? Yes  Agent: Please be advised that Rx refills may take up to 3 business days. We ask that you follow-up with your pharmacy.

## 2024-06-13 NOTE — Telephone Encounter (Signed)
 FYI Only or Action Required?: Action required by provider: request for appointment and Pt has been dismissed from the practice due to too many No Shows.   A letter was sent 05/29/2024. Pt said she was in the hospital.   She is asking not to be dismissed.   Called CAL twice but did not get an answer.   Message being sent to practice.   I tried to call her back after the computer disconnected the line.   Wasn't able to leave a voicemail because it was full.  Patient was last seen in primary care on 04/17/2024 by Zarwolo, Gloria, FNP.  Called Nurse Triage reporting Advice Only.  Symptoms began today.  Interventions attempted: Nothing.  Symptoms are: rapidly worsening. Asking not to be dismissed from the practice.    Triage Disposition:  Call PCP.   Message sent for practice to contact pt regarding her dismissal from the practice.  Patient/caregiver understands and will follow disposition?: Unsure  Copied from CRM [id number]. Topic: Clinical - Red Word Triage >> Jun 13, 2024 11:11 AM Elle L wrote: Red Word that prompted transfer to Nurse Triage: The patient called for a prescription refill. However, she advised me that she was about to have a panic attack and was experiencing severe anxiety. Reason for Disposition  [1] Caller requesting NON-URGENT health information AND [2] PCP's office is the best resource  Answer Assessment - Initial Assessment Questions 1. REASON FOR CALL: What is the main reason for your call? or How can I best help you?     Pt called in requesting a refill of her anxiety medications and to schedule a hospital follow up appt with [facility].   She was told she was dismissed from the practice.   I looked in her chart and she has been dismissed due to too many no shows.   A letter was also sent on 05/29/2024.   2. SYYMPTOMS : Do you have any symptoms?      I'm having a panic attack because I missed my appts because I was in the hospital.   Please don't dismiss  me. 3. OTHER QUESTIONS: Do you have any other questions?     Please ask them to not dismiss me.      I called the CAL line twice but got no answer.   The computer disconnected my call.  I called her back but was not able to leave a voicemail because it was full.   I'm sending a message to [facility].  Protocols used: Information Only Call - No Triage-A-AH

## 2024-06-15 ENCOUNTER — Emergency Department (HOSPITAL_COMMUNITY): Admission: EM | Admit: 2024-06-15 | Discharge: 2024-06-15 | Disposition: A | Discharge status: Home / Self Care

## 2024-06-15 ENCOUNTER — Emergency Department (HOSPITAL_COMMUNITY)

## 2024-06-15 ENCOUNTER — Other Ambulatory Visit: Payer: Self-pay

## 2024-06-15 ENCOUNTER — Encounter (HOSPITAL_COMMUNITY): Payer: Self-pay | Admitting: *Deleted

## 2024-06-15 DIAGNOSIS — E876 Hypokalemia: Secondary | ICD-10-CM | POA: Insufficient documentation

## 2024-06-15 DIAGNOSIS — J441 Chronic obstructive pulmonary disease with (acute) exacerbation: Secondary | ICD-10-CM | POA: Insufficient documentation

## 2024-06-15 DIAGNOSIS — R0602 Shortness of breath: Secondary | ICD-10-CM | POA: Diagnosis present

## 2024-06-15 DIAGNOSIS — F419 Anxiety disorder, unspecified: Secondary | ICD-10-CM | POA: Insufficient documentation

## 2024-06-15 DIAGNOSIS — G8929 Other chronic pain: Secondary | ICD-10-CM | POA: Diagnosis not present

## 2024-06-15 LAB — BASIC METABOLIC PANEL WITH GFR
Anion gap: 12 (ref 5–15)
BUN: 5 mg/dL — ABNORMAL LOW (ref 8–23)
CO2: 27 mmol/L (ref 22–32)
Calcium: 8.5 mg/dL — ABNORMAL LOW (ref 8.9–10.3)
Chloride: 99 mmol/L (ref 98–111)
Creatinine, Ser: 0.35 mg/dL — ABNORMAL LOW (ref 0.44–1.00)
GFR, Estimated: >60 mL/min (ref >60)
Glucose, Bld: 111 mg/dL — ABNORMAL HIGH (ref 70–99)
Potassium: 2.9 mmol/L — ABNORMAL LOW (ref 3.5–5.1)
Sodium: 138 mmol/L (ref 135–145)

## 2024-06-15 LAB — CBC
HCT: 33.5 % — ABNORMAL LOW (ref 36.0–46.0)
Hemoglobin: 10.6 g/dL — ABNORMAL LOW (ref 12.0–15.0)
MCH: 28 pg (ref 26.0–34.0)
MCHC: 31.6 g/dL (ref 30.0–36.0)
MCV: 88.6 fL (ref 80.0–100.0)
Platelets: 358 K/uL (ref 150–400)
RBC: 3.78 MIL/uL — ABNORMAL LOW (ref 3.87–5.11)
RDW: 14.3 % (ref 11.5–15.5)
WBC: 12.5 K/uL — ABNORMAL HIGH (ref 4.0–10.5)
nRBC: 0 % (ref 0.0–0.2)

## 2024-06-15 MED ORDER — POTASSIUM CHLORIDE CRYS ER 20 MEQ PO TBCR
40.0000 meq | EXTENDED_RELEASE_TABLET | Freq: Once | ORAL | Status: AC
  Administered 2024-06-15: 40 meq via ORAL
  Filled 2024-06-15: qty 2

## 2024-06-15 MED ORDER — PREDNISONE 10 MG PO TABS: 40.0000 mg | ORAL_TABLET | Freq: Every day | ORAL | 0 refills | Status: AC

## 2024-06-15 MED ORDER — IPRATROPIUM-ALBUTEROL 0.5-2.5 (3) MG/3ML IN SOLN
3.0000 mL | Freq: Once | RESPIRATORY_TRACT | Status: AC
  Administered 2024-06-15: 3 mL via RESPIRATORY_TRACT
  Filled 2024-06-15: qty 3

## 2024-06-15 MED ORDER — ALPRAZOLAM 0.5 MG PO TABS
1.0000 mg | ORAL_TABLET | Freq: Once | ORAL | Status: AC
  Administered 2024-06-15: 1 mg via ORAL
  Filled 2024-06-15: qty 2

## 2024-06-15 MED ORDER — OXYCODONE HCL 5 MG PO TABS
5.0000 mg | ORAL_TABLET | Freq: Once | ORAL | Status: AC
  Administered 2024-06-15: 5 mg via ORAL
  Filled 2024-06-15: qty 1

## 2024-06-15 MED ORDER — PREDNISONE 50 MG PO TABS
60.0000 mg | ORAL_TABLET | Freq: Once | ORAL | Status: AC
  Administered 2024-06-15: 60 mg via ORAL
  Filled 2024-06-15: qty 1
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
## 2024-06-15 NOTE — ED Notes (Signed)
 Respiratory called for neb tx

## 2024-06-15 NOTE — ED Notes (Signed)
 Pt requesting a xanax , states she ran out of the medication couple days ago.

## 2024-06-15 NOTE — ED Notes (Signed)
 Pt states she has ran out of pain medication as well.

## 2024-06-15 NOTE — ED Notes (Signed)
 ED Provider at bedside.
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
## 2024-06-15 NOTE — Discharge Instructions (Addendum)
 Continue your inhalers as needed and take your steroids as prescribed.  Call your primary care doctor to make a follow-up appointment to discuss with them your pain medications and anxiety medications.

## 2024-06-16 ENCOUNTER — Telehealth: Payer: Self-pay | Admitting: Internal Medicine

## 2024-06-16 NOTE — Telephone Encounter (Signed)
 Copied from CRM [id number]. Topic: Clinical - Order For Equipment >> Jun 15, 2024  4:31 PM Rilla B wrote: Reason for CRM: Patient would like Dr Darlean to send a message to [employer] and state he is still her provider and she would like to order a small machine, that she can carry over her shoulder. [employer] FAX to [phone].   ----------------------------------------------------------------------- From previous Reason for Contact - Other: Reason for CRM: Patient would like Dr Darlean to send a message to [employer] and state he is still her provider and she would like to order a small machine, that she can carry over her shoulder. [employer] FAX to [phone].  Spoke with patient and informed her we have received paperwork (orders) from [employer] for an oxygen concentrator---Dr. Darlean will be back in the office on Monday 06/19/24 and it will be address then.  She voiced her undersanding

## 2024-06-21 ENCOUNTER — Other Ambulatory Visit: Payer: Self-pay

## 2024-06-21 DIAGNOSIS — F410 Panic disorder [episodic paroxysmal anxiety] without agoraphobia: Secondary | ICD-10-CM

## 2024-06-22 ENCOUNTER — Telehealth: Payer: Self-pay

## 2024-06-22 NOTE — Telephone Encounter (Signed)
 Copied from CRM [id number]. Topic: Clinical - Medication Question >> Jun 22, 2024 10:22 AM Franky GRADE wrote: Reason for CRM: Patient was informed that her ALPRAZolam (XANAX) 1 MG tablet [[id number]] was denied and she doesn't understand why, she states she was recently seen and has an upcoming appointment on 08/24/2024.

## 2024-06-27 ENCOUNTER — Other Ambulatory Visit: Payer: Self-pay | Admitting: Family Medicine

## 2024-07-20 ENCOUNTER — Telehealth: Payer: Self-pay

## 2024-07-20 NOTE — Telephone Encounter (Unsigned)
 Copied from CRM #[id number]. Topic: Complaint (DO NOT CONVERT) - Dismissed Patient >> Jul 20, 2024  3:08 PM [name] wrote: Patient name/MRN/Acct #: [id number] DOS: 06/28/2024 Details of complaint: Casey Doffing, Casey [name] in [facility]: Casey [name], Casey [name] has been dismissed from [facility] since 06/28/24 for the following reason: Discharged from Practice Patient is asking to scheduled appointment with [name]- states that she no showed appointments before because she was in the hospital   Route to Practice Administrator.
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
## 2024-07-24 ENCOUNTER — Ambulatory Visit (INDEPENDENT_AMBULATORY_CARE_PROVIDER_SITE_OTHER): Admitting: Internal Medicine

## 2024-07-24 ENCOUNTER — Encounter: Payer: Self-pay | Admitting: Internal Medicine

## 2024-07-24 VITALS — BP 123/76 | HR 91 | Ht 65.0 in | Wt 121.4 lb

## 2024-07-24 DIAGNOSIS — J4489 Other specified chronic obstructive pulmonary disease: Secondary | ICD-10-CM

## 2024-07-24 DIAGNOSIS — F17210 Nicotine dependence, cigarettes, uncomplicated: Secondary | ICD-10-CM

## 2024-07-24 DIAGNOSIS — J9611 Chronic respiratory failure with hypoxia: Secondary | ICD-10-CM

## 2024-07-24 MED ORDER — METHYLPREDNISOLONE ACETATE 80 MG/ML IJ SUSP
120.0000 mg | Freq: Once | INTRAMUSCULAR | Status: AC
  Administered 2024-07-24: 120 mg via INTRAMUSCULAR

## 2024-07-24 NOTE — Patient Instructions (Addendum)
 Duoneb up  4 x daily (albuterol  with ipatropium)   On the first and the third dose add budesonide  0.25 mg to the duoneb   For cough/ congestion > mucinex  or mucinex  dm  up to maximum of  1200 mg every 12 hours and use the flutter valve as much as you can    Pecpid 20 mg add this after supper but at least an  hour before bedtime  Depomdedrol 120 mg IM today      Please schedule a follow up office visit in 4 weeks, call sooner if needed with all medications /inhalers/ solutions in hand so we can verify exactly what you are taking. This includes all medications from all doctors and over the counters

## 2024-07-29 ENCOUNTER — Telehealth: Payer: Self-pay | Admitting: Internal Medicine

## 2024-07-29 NOTE — Telephone Encounter (Signed)
 Needs PFTs next available
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
## 2024-07-29 NOTE — Assessment & Plan Note (Addendum)
 Active smoking  - 09/28/2023  After extensive coaching inhaler device,  effectiveness =    60% with hfa and smi  - 09/28/2023 start symbicort  160/spiriva  2.5   samples and f/u 6 weeks  - 10/15/2023   > add neb alb q 4h prn  - 04/06/2024  After extensive coaching inhaler device,  effectiveness =  25% at best so rec just rx duoneb qid and budesonide  0.25 mg bid per neb   >>> PFTs ordered/ depomedrol 120 mg IM and f/u in with pfts in  4 weeks/ no change in rx needed in meantime

## 2024-07-29 NOTE — Assessment & Plan Note (Addendum)
 Counseled re importance of smoking cessation but did not meet time criteria for separate billing

## 2024-08-02 ENCOUNTER — Telehealth: Payer: Self-pay | Admitting: Internal Medicine

## 2024-08-02 NOTE — Telephone Encounter (Signed)
 Copied from CRM #[id number]. Topic: Clinical - Order For Equipment >> Aug 02, 2024  4:02 PM Joesph PARAS wrote: Reason for CRM: Patient is calling to request that Dr.Wert approve her for Nitrogen - states it is a box for her to carry her oxygen in.

## 2024-08-03 ENCOUNTER — Telehealth: Payer: Self-pay | Admitting: Internal Medicine

## 2024-08-03 NOTE — Telephone Encounter (Signed)
 ATC patient x1.  No answer.  VM full.  Sent [employer].

## 2024-08-03 NOTE — Telephone Encounter (Signed)
 Copied from CRM [id number]. Topic: Medical Record Request - Provider/Facility Request >> Aug 03, 2024 10:35 AM Leila C wrote: Reason for CRM: Inocente from [employer] [phone] states patient is trying to establish with the company and needs to confirm if patient has been seen within year and 6 minute walk. Patient 07/24/2024 with Dr. Darlean, but unable to see if patient had a 6 minute walk. Please advise and call back.

## 2024-08-04 NOTE — Telephone Encounter (Signed)
 I called pt and there was no answer- her vm was full so unable to leave msg.

## 2024-08-07 ENCOUNTER — Other Ambulatory Visit: Payer: Self-pay

## 2024-08-07 DIAGNOSIS — J4489 Other specified chronic obstructive pulmonary disease: Secondary | ICD-10-CM

## 2024-08-07 NOTE — Telephone Encounter (Signed)
 Called to inform pt of pft that dr wert rec her to have - pt aske da question about nitrogen? Will follow up

## 2024-08-08 ENCOUNTER — Telehealth: Payer: Self-pay | Admitting: Internal Medicine

## 2024-08-08 NOTE — Telephone Encounter (Signed)
 Copied from CRM [id number]. Topic: Appointments - Scheduling Inquiry for Clinic >> Aug 08, 2024 11:46 AM Casey [name] wrote: Reason for CRM: Patient is calling to schedule her PFT at [facility] - requesting to speak to someone in the office.   Callback number: [phone]  Called to schedule PFT----no answer and voice mail is full

## 2024-08-08 NOTE — Telephone Encounter (Signed)
 Atc pt and unable to leave vm

## 2024-08-10 ENCOUNTER — Encounter: Payer: Self-pay | Admitting: Internal Medicine

## 2024-08-10 NOTE — Telephone Encounter (Signed)
 Called to discuss the Tuesday 10/24/24 2:00pm PFT appointment at [facility] time is 1:45 pm --1st floor registration desk for check in--will mail information to patient and will request return call with questions

## 2024-08-23 NOTE — Telephone Encounter (Signed)
 Copied from CRM [id number]. Topic: Medical Record Request - Provider/Facility Request >> Aug 03, 2024 10:35 AM Leila C wrote: Reason for CRM: Inocente from [employer] [phone] states patient is trying to establish with the company and needs to confirm if patient has been seen within year and 6 minute walk. Patient 07/24/2024 with Dr. Darlean, but unable to see if patient had a 6 minute walk. Please advise and call back. >> Aug 23, 2024 10:32 AM Rozanna MATSU wrote: Inocente from [employer] [phone] calling inregards to the fax that was sent on 09/25, also stated she is needing the pts past year chart notes which needs to include the walk test for the patient  Pt needs to qualify for o2 - pt has not had a walk test in over a year - pt has appt scheduled for 11/11

## 2024-08-24 ENCOUNTER — Ambulatory Visit: Payer: MEDICAID | Admitting: Family Medicine

## 2024-08-24 ENCOUNTER — Ambulatory Visit: Admitting: Internal Medicine

## 2024-09-19 ENCOUNTER — Encounter: Payer: Self-pay | Admitting: Internal Medicine

## 2024-09-19 ENCOUNTER — Ambulatory Visit: Admitting: Internal Medicine

## 2024-09-19 VITALS — BP 143/72 | HR 85 | Ht 65.0 in | Wt 126.0 lb

## 2024-09-19 DIAGNOSIS — J4489 Other specified chronic obstructive pulmonary disease: Secondary | ICD-10-CM | POA: Diagnosis not present

## 2024-09-19 DIAGNOSIS — J9611 Chronic respiratory failure with hypoxia: Secondary | ICD-10-CM | POA: Diagnosis not present

## 2024-09-19 NOTE — Assessment & Plan Note (Addendum)
 Quit smoking 09/2024  - 09/28/2023  After extensive coaching inhaler device,  effectiveness =    60% with hfa and smi  - 09/28/2023 start symbicort  160/spiriva  2.5   samples and f/u 6 weeks  - 10/15/2023   > add neb alb q 4h prn  - 04/06/2024  After extensive coaching inhaler device,  effectiveness =  25% at best so rec just rx duoneb qid and budesonide  0.25 mg bid per neb  - PFTs ordered 07/24/2024   She looks much better but has a ways to go before the (cigarette) smoke clears so reviewed each of the recs from last ov and explained how they worked together to address both her cough and breathing difficulty and encouraged her strongly to begin submax exercise  eg pushing the cart at grocery store.
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
## 2024-09-19 NOTE — Patient Instructions (Addendum)
 If any trouble at all with breathing/ coughing/ congestion :  Take duoneb (albuterol / ipatropium)   4 x daily and On the first and the third dose add Budesonide  0.25 mg to the duoneb   - if doing great can skip the 2nd and 4th doses .    For cough/ congestion >  mucinex  dm  up to maximum of  1200 mg every 12 hours and use the flutter valve as much as you can  and add protonix  before breakfast and pepcid  20 mg after   supper  Make sure you check your oxygen saturation  AT  your highest level of activity (not after you stop)   to be sure it stays over 90% and adjust  02 flow upward to maintain this level if needed but remember to turn it back to previous settings when you stop (to conserve your supply).    Please schedule a follow up visit in 3 months but call sooner if needed  with all RESPIRATORY  medications /inhalers/ solutions/flutter vave  in hand so we can verify exactly what you are taking. This includes all medications from all doctors and over the counters
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
## 2024-09-25 ENCOUNTER — Ambulatory Visit (HOSPITAL_COMMUNITY): Payer: MEDICAID | Admitting: Registered Nurse

## 2024-09-25 ENCOUNTER — Encounter (HOSPITAL_COMMUNITY): Payer: Self-pay | Admitting: Registered Nurse

## 2024-09-25 DIAGNOSIS — F331 Major depressive disorder, recurrent, moderate: Secondary | ICD-10-CM | POA: Diagnosis not present

## 2024-09-25 DIAGNOSIS — G4700 Insomnia, unspecified: Secondary | ICD-10-CM

## 2024-09-25 DIAGNOSIS — F411 Generalized anxiety disorder: Secondary | ICD-10-CM | POA: Diagnosis not present

## 2024-09-25 DIAGNOSIS — F339 Major depressive disorder, recurrent, unspecified: Secondary | ICD-10-CM

## 2024-09-25 MED ORDER — GABAPENTIN 100 MG PO CAPS: 100.0000 mg | ORAL_CAPSULE | Freq: Three times a day (TID) | ORAL | 2 refills | Status: DC

## 2024-09-25 MED ORDER — CITALOPRAM HYDROBROMIDE 40 MG PO TABS: 40.0000 mg | ORAL_TABLET | Freq: Every day | ORAL | 2 refills | Status: DC

## 2024-09-25 MED ORDER — HYDROXYZINE HCL 50 MG PO TABS: 50.0000 mg | ORAL_TABLET | Freq: Three times a day (TID) | ORAL | 2 refills | Status: DC | PRN
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
## 2024-09-25 NOTE — Patient Instructions (Signed)

## 2024-10-02 ENCOUNTER — Telehealth: Payer: Self-pay

## 2024-10-02 NOTE — Telephone Encounter (Signed)
 Copied from CRM #[id number]. Topic: Appointments - Scheduling Inquiry for Clinic >> Oct 02, 2024  4:36 PM Sophia H wrote: Reason for CRM: Was a patient of clinics and states was discharged due to no shows - patient is stating every time she missed an appt she was in the hospital. Wanting to know if she can re establish care # [phone]

## 2024-10-24 ENCOUNTER — Ambulatory Visit (HOSPITAL_COMMUNITY): Admission: RE | Admit: 2024-10-24 | Discharge: 2024-10-24 | Attending: Internal Medicine | Admitting: Internal Medicine

## 2024-10-24 DIAGNOSIS — J4489 Other specified chronic obstructive pulmonary disease: Secondary | ICD-10-CM | POA: Diagnosis present

## 2024-10-24 LAB — PULMONARY FUNCTION TEST
DL/VA % pred: 73 %
DL/VA: 3.05 ml/min/mmHg/L
DLCO unc % pred: 39 %
DLCO unc: 8.05 ml/min/mmHg
FEF 25-75 Post: 0.46 L/s
FEF 25-75 Pre: 0.34 L/s
FEF2575-%Change-Post: 35 %
FEF2575-%Pred-Post: 21 %
FEF2575-%Pred-Pre: 15 %
FEV1-%Change-Post: 10 %
FEV1-%Pred-Post: 34 %
FEV1-%Pred-Pre: 30 %
FEV1-Post: 0.86 L
FEV1-Pre: 0.78 L
FEV1FVC-%Change-Post: -9 %
FEV1FVC-%Pred-Pre: 79 %
FEV6-%Change-Post: 19 %
FEV6-%Pred-Post: 47 %
FEV6-%Pred-Pre: 39 %
FEV6-Post: 1.49 L
FEV6-Pre: 1.25 L
FEV6FVC-%Change-Post: -2 %
FEV6FVC-%Pred-Post: 99 %
FEV6FVC-%Pred-Pre: 102 %
FVC-%Change-Post: 23 %
FVC-%Pred-Post: 47 %
FVC-%Pred-Pre: 38 %
FVC-Post: 1.56 L
FVC-Pre: 1.27 L
Post FEV1/FVC ratio: 55 %
Post FEV6/FVC ratio: 96 %
Pre FEV1/FVC ratio: 61 %
Pre FEV6/FVC Ratio: 98 %
RV % pred: 198 %
RV: 4.25 L
TLC % pred: 110 %
TLC: 5.76 L

## 2024-10-24 MED ORDER — ALBUTEROL SULFATE (2.5 MG/3ML) 0.083% IN NEBU
2.5000 mg | INHALATION_SOLUTION | Freq: Once | RESPIRATORY_TRACT | Status: AC
  Administered 2024-10-24: 15:00:00 2.5 mg via RESPIRATORY_TRACT

## 2024-10-29 ENCOUNTER — Encounter: Payer: Self-pay | Admitting: Internal Medicine

## 2024-10-29 ENCOUNTER — Ambulatory Visit: Payer: Self-pay | Admitting: Internal Medicine

## 2024-10-30 NOTE — Telephone Encounter (Signed)
 Copied from CRM #[id number]. Topic: General - Other >> Oct 30, 2024 11:02 AM Benton O wrote: Reason for CRM: patient is requesting for the clinic to confirm, with [facility] that she was diagnosis with copd because they are questioning it because she just had a test . On the 16th of December and patient is also requesting for a order for a nilton like a purse or something for her to carry the oxygen machine.   Please reach out to patient concerning this information  [phone]

## 2024-10-31 NOTE — Telephone Encounter (Signed)
 [facility] mail box full - pts last walk did not qualify her for a poc, she can make appt and try again. Unsure of what nilton is but if referring to poc then she will need appt.

## 2024-10-31 NOTE — Telephone Encounter (Signed)
 Mrs. Casey [name] returned your call and she is scheduled with Dr. Darlean 11/08/24 at 10:45 am

## 2024-11-06 ENCOUNTER — Telehealth: Payer: Self-pay

## 2024-11-06 NOTE — Telephone Encounter (Signed)
 Pt and pharm requesting refill of symbicort  - I do not see this inhaler on med list but I see where it has be mentioned in AVS, would you like me to refill

## 2024-11-08 ENCOUNTER — Encounter: Payer: Self-pay | Admitting: Internal Medicine

## 2024-11-08 ENCOUNTER — Ambulatory Visit: Admitting: Internal Medicine

## 2024-11-08 VITALS — BP 136/83 | HR 91 | Ht 65.0 in | Wt 132.0 lb

## 2024-11-08 DIAGNOSIS — J449 Chronic obstructive pulmonary disease, unspecified: Secondary | ICD-10-CM

## 2024-11-08 DIAGNOSIS — Z87891 Personal history of nicotine dependence: Secondary | ICD-10-CM

## 2024-11-08 DIAGNOSIS — R058 Other specified cough: Secondary | ICD-10-CM | POA: Diagnosis not present

## 2024-11-08 DIAGNOSIS — J9611 Chronic respiratory failure with hypoxia: Secondary | ICD-10-CM | POA: Diagnosis not present

## 2024-11-08 DIAGNOSIS — J4489 Other specified chronic obstructive pulmonary disease: Secondary | ICD-10-CM

## 2024-11-08 MED ORDER — BUDESONIDE 0.25 MG/2ML IN SUSP: RESPIRATORY_TRACT | 12 refills | Status: DC

## 2024-11-08 MED ORDER — PREDNISONE 10 MG PO TABS: ORAL_TABLET | ORAL | 0 refills | Status: DC

## 2024-11-08 MED ORDER — FAMOTIDINE 20 MG PO TABS: ORAL_TABLET | ORAL | 11 refills | Status: DC

## 2024-11-08 MED ORDER — AZITHROMYCIN 250 MG PO TABS: ORAL_TABLET | ORAL | 0 refills | Status: DC

## 2024-11-08 MED ORDER — IPRATROPIUM-ALBUTEROL 0.5-2.5 (3) MG/3ML IN SOLN: RESPIRATORY_TRACT | 11 refills | Status: DC

## 2024-11-08 NOTE — Patient Instructions (Addendum)
 Add budesonide  0.25 mg to 1st and third duoneb (bfast and supper) and add the lunch and bedtime duoneb if not doing great  Prednisone  10 mg take  4 each am x 2 days,  2 each am x 2 days,  1 each am x 2 days and stop   Zpak should turn the mucus clear.  For cough/ congestion > mucinex  or mucinex  dm  up to maximum of  1200 mg every 12 hours and use the flutter valve as much as you can    Also  Ok to try albuterol  15 min before an activity (on alternating days like starter fluid)  that you know would usually make you short of breath and see if it makes any difference and if makes none then don't take albuterol  after activity unless you can't catch your breath as this means it's the resting that helps, not the albuterol .  Work on inhaler technique:  relax and gently blow all the way out then take a nice smooth full deep breath back in, triggering the inhaler at same time you start breathing in.  Hold breath in for at least  5 seconds if you can.     Continue Pantoprazole  (protonix ) 40 mg   Take  30-60 min before first meal of the day and Pepcid  (famotidine )  20 mg after supper until return to office - this is the best way to tell whether stomach acid is contributing to your problem.    Jolly ranchers/ stovers/ life savers ok but not the white ones    Gabapentin  100 mg add a pill every 5 days  when you reach either no cough or 1200 mg per day stop there and call me with the amount you need  Make sure you check your oxygen saturation  AT  your highest level of activity (not after you stop)   to be sure it stays over 90% and adjust  02 flow upward to maintain this level if needed but remember to turn it back to previous settings when you stop (to conserve your supply).   Please schedule a follow up office visit in 6 weeks, call sooner if needed with all RESPIRATORY MEDS
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
## 2024-11-08 NOTE — Assessment & Plan Note (Signed)
>>>   max gerd rx plus avoid inhalers in favor of nebs/ use hard rock candy to avoid throat clearing  >>> flutter valve training reviewed >>> gabapentin  100 titrated up to 1200 mg per day or elimination of cough, which ever comes first   Discussed in detail all the  indications, usual  risks and alternatives  relative to the benefits with patient who agrees to proceed with Rx as outlined.       Each maintenance medication was reviewed in detail including emphasizing most importantly the difference between maintenance and prns and under what circumstances the prns are to be triggered using an action plan format where appropriate.  Total time for H and P, chart review, counseling, reviewing flutter/ hfa/ neb device(s) and generating customized AVS unique to this office visit / same day charting = 43 min for pt with multiple chronic   refractory respiratory  symptoms of uncertain etiology

## 2024-11-08 NOTE — Assessment & Plan Note (Addendum)
 GOLD 3 with AB features  Quit smoking 09/2024  - 09/28/2023  After extensive coaching inhaler device,  effectiveness =    60% with hfa and smi  - 09/28/2023 start symbicort  160/spiriva  2.5   samples and f/u 6 weeks  - 10/15/2023   > add neb alb q 4h prn  - 04/06/2024  After extensive coaching inhaler device,  effectiveness =  25% at best so rec just rx duoneb qid and budesonide  0.25 mg bid per neb  - PFT's  10/24/24  FEV1 0.86 (34 % ) ratio 0.55  p 10 % improvement from saba p albuterol  prior to study with DLCO  8 (39%)   and FV curve classically concave   - 11/08/2024  After extensive coaching inhaler device,  effectiveness =    50% with MDI/ 100% with flutter  >>> rx duoneb with budesonide   bid  and additional duoneb prn    >>> zpak and prednisone  x 6 days

## 2024-11-14 NOTE — Telephone Encounter (Signed)
Pt seen in clinic last week.

## 2024-11-20 ENCOUNTER — Ambulatory Visit (HOSPITAL_COMMUNITY): Admitting: Registered Nurse
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
## 2024-11-26 ENCOUNTER — Encounter (HOSPITAL_COMMUNITY): Payer: Self-pay | Admitting: Emergency Medicine

## 2024-11-26 ENCOUNTER — Inpatient Hospital Stay (HOSPITAL_COMMUNITY)
Admission: EM | Admit: 2024-11-26 | Discharge: 2024-12-01 | DRG: 480 | Disposition: A | Discharge status: Skilled Nursing Facility | Attending: Internal Medicine | Admitting: Internal Medicine

## 2024-11-26 ENCOUNTER — Other Ambulatory Visit: Payer: Self-pay

## 2024-11-26 ENCOUNTER — Emergency Department (HOSPITAL_COMMUNITY)

## 2024-11-26 DIAGNOSIS — F410 Panic disorder [episodic paroxysmal anxiety] without agoraphobia: Secondary | ICD-10-CM | POA: Diagnosis present

## 2024-11-26 DIAGNOSIS — Z79899 Other long term (current) drug therapy: Secondary | ICD-10-CM

## 2024-11-26 DIAGNOSIS — F419 Anxiety disorder, unspecified: Secondary | ICD-10-CM | POA: Diagnosis present

## 2024-11-26 DIAGNOSIS — E43 Unspecified severe protein-calorie malnutrition: Secondary | ICD-10-CM | POA: Diagnosis present

## 2024-11-26 DIAGNOSIS — Y92000 Kitchen of unspecified non-institutional (private) residence as  the place of occurrence of the external cause: Secondary | ICD-10-CM | POA: Diagnosis not present

## 2024-11-26 DIAGNOSIS — J9611 Chronic respiratory failure with hypoxia: Secondary | ICD-10-CM | POA: Diagnosis present

## 2024-11-26 DIAGNOSIS — Z833 Family history of diabetes mellitus: Secondary | ICD-10-CM | POA: Diagnosis not present

## 2024-11-26 DIAGNOSIS — Z87891 Personal history of nicotine dependence: Secondary | ICD-10-CM | POA: Diagnosis not present

## 2024-11-26 DIAGNOSIS — Z6821 Body mass index (BMI) 21.0-21.9, adult: Secondary | ICD-10-CM

## 2024-11-26 DIAGNOSIS — F32A Depression, unspecified: Secondary | ICD-10-CM | POA: Diagnosis present

## 2024-11-26 DIAGNOSIS — Z818 Family history of other mental and behavioral disorders: Secondary | ICD-10-CM

## 2024-11-26 DIAGNOSIS — Z888 Allergy status to other drugs, medicaments and biological substances status: Secondary | ICD-10-CM

## 2024-11-26 DIAGNOSIS — D72829 Elevated white blood cell count, unspecified: Secondary | ICD-10-CM | POA: Diagnosis present

## 2024-11-26 DIAGNOSIS — Z6820 Body mass index (BMI) 20.0-20.9, adult: Secondary | ICD-10-CM

## 2024-11-26 DIAGNOSIS — F5101 Primary insomnia: Secondary | ICD-10-CM | POA: Diagnosis present

## 2024-11-26 DIAGNOSIS — Z8659 Personal history of other mental and behavioral disorders: Secondary | ICD-10-CM

## 2024-11-26 DIAGNOSIS — J441 Chronic obstructive pulmonary disease with (acute) exacerbation: Secondary | ICD-10-CM | POA: Diagnosis present

## 2024-11-26 DIAGNOSIS — J449 Chronic obstructive pulmonary disease, unspecified: Secondary | ICD-10-CM | POA: Diagnosis not present

## 2024-11-26 DIAGNOSIS — Z23 Encounter for immunization: Secondary | ICD-10-CM

## 2024-11-26 DIAGNOSIS — W19XXXA Unspecified fall, initial encounter: Secondary | ICD-10-CM

## 2024-11-26 DIAGNOSIS — S72002A Fracture of unspecified part of neck of left femur, initial encounter for closed fracture: Principal | ICD-10-CM | POA: Diagnosis present

## 2024-11-26 DIAGNOSIS — Z9981 Dependence on supplemental oxygen: Secondary | ICD-10-CM

## 2024-11-26 DIAGNOSIS — F1320 Sedative, hypnotic or anxiolytic dependence, uncomplicated: Secondary | ICD-10-CM | POA: Diagnosis present

## 2024-11-26 DIAGNOSIS — Z8249 Family history of ischemic heart disease and other diseases of the circulatory system: Secondary | ICD-10-CM

## 2024-11-26 DIAGNOSIS — M25552 Pain in left hip: Secondary | ICD-10-CM | POA: Diagnosis present

## 2024-11-26 DIAGNOSIS — E559 Vitamin D deficiency, unspecified: Secondary | ICD-10-CM | POA: Diagnosis present

## 2024-11-26 DIAGNOSIS — Z7951 Long term (current) use of inhaled steroids: Secondary | ICD-10-CM

## 2024-11-26 DIAGNOSIS — F1991 Other psychoactive substance use, unspecified, in remission: Secondary | ICD-10-CM | POA: Diagnosis present

## 2024-11-26 DIAGNOSIS — S72142A Displaced intertrochanteric fracture of left femur, initial encounter for closed fracture: Principal | ICD-10-CM | POA: Diagnosis present

## 2024-11-26 DIAGNOSIS — G8929 Other chronic pain: Secondary | ICD-10-CM | POA: Diagnosis present

## 2024-11-26 DIAGNOSIS — D62 Acute posthemorrhagic anemia: Secondary | ICD-10-CM | POA: Diagnosis not present

## 2024-11-26 DIAGNOSIS — M545 Low back pain, unspecified: Secondary | ICD-10-CM | POA: Diagnosis present

## 2024-11-26 DIAGNOSIS — D529 Folate deficiency anemia, unspecified: Secondary | ICD-10-CM | POA: Diagnosis present

## 2024-11-26 DIAGNOSIS — Z765 Malingerer [conscious simulation]: Secondary | ICD-10-CM | POA: Diagnosis not present

## 2024-11-26 DIAGNOSIS — F411 Generalized anxiety disorder: Secondary | ICD-10-CM | POA: Diagnosis present

## 2024-11-26 DIAGNOSIS — R110 Nausea: Secondary | ICD-10-CM | POA: Diagnosis present

## 2024-11-26 DIAGNOSIS — W1830XA Fall on same level, unspecified, initial encounter: Secondary | ICD-10-CM | POA: Diagnosis present

## 2024-11-26 DIAGNOSIS — F418 Other specified anxiety disorders: Secondary | ICD-10-CM | POA: Diagnosis not present

## 2024-11-26 DIAGNOSIS — Z87820 Personal history of traumatic brain injury: Secondary | ICD-10-CM

## 2024-11-26 DIAGNOSIS — Z96612 Presence of left artificial shoulder joint: Secondary | ICD-10-CM | POA: Diagnosis present

## 2024-11-26 DIAGNOSIS — G4700 Insomnia, unspecified: Secondary | ICD-10-CM

## 2024-11-26 LAB — BASIC METABOLIC PANEL WITH GFR
Anion gap: 12 (ref 5–15)
BUN: 7 mg/dL — ABNORMAL LOW (ref 8–23)
CO2: 28 mmol/L (ref 22–32)
Calcium: 9 mg/dL (ref 8.9–10.3)
Chloride: 100 mmol/L (ref 98–111)
Creatinine, Ser: 0.53 mg/dL (ref 0.44–1.00)
GFR, Estimated: >60 mL/min
Glucose, Bld: 158 mg/dL — ABNORMAL HIGH (ref 70–99)
Potassium: 4.3 mmol/L (ref 3.5–5.1)
Sodium: 139 mmol/L (ref 135–145)

## 2024-11-26 LAB — CBC WITH DIFFERENTIAL/PLATELET
Abs Immature Granulocytes: 0.07 K/uL (ref 0.00–0.07)
Basophils Absolute: 0.1 K/uL (ref 0.0–0.1)
Basophils Relative: 0 %
Eosinophils Absolute: 0.1 K/uL (ref 0.0–0.5)
Eosinophils Relative: 0 %
HCT: 37.3 % (ref 36.0–46.0)
Hemoglobin: 12 g/dL (ref 12.0–15.0)
Immature Granulocytes: 0 %
Lymphocytes Relative: 11 %
Lymphs Abs: 2 K/uL (ref 0.7–4.0)
MCH: 28.4 pg (ref 26.0–34.0)
MCHC: 32.2 g/dL (ref 30.0–36.0)
MCV: 88.4 fL (ref 80.0–100.0)
Monocytes Absolute: 1 K/uL (ref 0.1–1.0)
Monocytes Relative: 6 %
Neutro Abs: 14.1 K/uL — ABNORMAL HIGH (ref 1.7–7.7)
Neutrophils Relative %: 83 %
Platelets: 379 K/uL (ref 150–400)
RBC: 4.22 MIL/uL (ref 3.87–5.11)
RDW: 14.6 % (ref 11.5–15.5)
WBC: 17.2 K/uL — ABNORMAL HIGH (ref 4.0–10.5)
nRBC: 0 % (ref 0.0–0.2)

## 2024-11-26 LAB — CK: Total CK: 162 U/L (ref 38–234)

## 2024-11-26 LAB — VITAMIN D 25 HYDROXY (VIT D DEFICIENCY, FRACTURES): Vit D, 25-Hydroxy: 21.2 ng/mL — ABNORMAL LOW (ref 30–100)

## 2024-11-26 LAB — HIV ANTIBODY (ROUTINE TESTING W REFLEX): HIV Screen 4th Generation wRfx: NONREACTIVE

## 2024-11-26 LAB — PROTIME-INR
INR: 1 (ref 0.8–1.2)
Prothrombin Time: 13.4 s (ref 11.4–15.2)

## 2024-11-26 MED ORDER — DOCUSATE SODIUM 100 MG PO CAPS
100.0000 mg | ORAL_CAPSULE | Freq: Two times a day (BID) | ORAL | Status: DC
  Administered 2024-11-26 – 2024-12-01 (×9): 100 mg via ORAL
  Filled 2024-11-26 (×9): qty 1

## 2024-11-26 MED ORDER — GABAPENTIN 100 MG PO CAPS
100.0000 mg | ORAL_CAPSULE | Freq: Three times a day (TID) | ORAL | Status: DC
  Administered 2024-11-26 – 2024-12-01 (×14): 100 mg via ORAL
  Filled 2024-11-26 (×14): qty 1

## 2024-11-26 MED ORDER — CITALOPRAM HYDROBROMIDE 20 MG PO TABS
40.0000 mg | ORAL_TABLET | Freq: Every day | ORAL | Status: DC
  Administered 2024-11-26 – 2024-12-01 (×5): 40 mg via ORAL
  Filled 2024-11-26 (×6): qty 2

## 2024-11-26 MED ORDER — INFLUENZA VAC SPLIT HIGH-DOSE 0.5 ML IM SUSY
0.5000 mL | PREFILLED_SYRINGE | INTRAMUSCULAR | Status: AC
  Administered 2024-11-27: 0.5 mL via INTRAMUSCULAR
  Filled 2024-11-26: qty 0.5

## 2024-11-26 MED ORDER — HYDROMORPHONE HCL 1 MG/ML IJ SOLN
0.5000 mg | Freq: Once | INTRAMUSCULAR | Status: AC
  Administered 2024-11-26: 0.5 mg via INTRAVENOUS
  Filled 2024-11-26: qty 0.5

## 2024-11-26 MED ORDER — BUDESONIDE 0.25 MG/2ML IN SUSP
0.2500 mg | Freq: Two times a day (BID) | RESPIRATORY_TRACT | Status: DC
  Administered 2024-11-26 – 2024-12-01 (×11): 0.25 mg via RESPIRATORY_TRACT
  Filled 2024-11-26 (×10): qty 2

## 2024-11-26 MED ORDER — METHOCARBAMOL 500 MG PO TABS
500.0000 mg | ORAL_TABLET | Freq: Four times a day (QID) | ORAL | Status: DC | PRN
  Administered 2024-11-26 – 2024-11-28 (×4): 500 mg via ORAL
  Filled 2024-11-26 (×5): qty 1

## 2024-11-26 MED ORDER — ENSURE PLUS HIGH PROTEIN PO LIQD: 237.0000 mL | Freq: Two times a day (BID) | ORAL | Status: DC

## 2024-11-26 MED ORDER — ADULT MULTIVITAMIN W/MINERALS CH
1.0000 | ORAL_TABLET | Freq: Every day | ORAL | Status: DC
  Administered 2024-11-26 – 2024-12-01 (×5): 1 via ORAL
  Filled 2024-11-26 (×5): qty 1

## 2024-11-26 MED ORDER — ZOLPIDEM TARTRATE 5 MG PO TABS
5.0000 mg | ORAL_TABLET | Freq: Every evening | ORAL | Status: DC | PRN
  Administered 2024-11-26: 5 mg via ORAL
  Filled 2024-11-26: qty 1

## 2024-11-26 MED ORDER — HYDROCODONE-ACETAMINOPHEN 5-325 MG PO TABS
1.0000 | ORAL_TABLET | Freq: Four times a day (QID) | ORAL | Status: DC | PRN
  Administered 2024-11-26: 1 via ORAL
  Administered 2024-11-26: 2 via ORAL
  Filled 2024-11-26: qty 2
  Filled 2024-11-26: qty 1

## 2024-11-26 MED ORDER — IPRATROPIUM-ALBUTEROL 0.5-2.5 (3) MG/3ML IN SOLN
3.0000 mL | Freq: Four times a day (QID) | RESPIRATORY_TRACT | Status: DC
  Administered 2024-11-26 – 2024-11-27 (×3): 3 mL via RESPIRATORY_TRACT
  Filled 2024-11-26 (×3): qty 3

## 2024-11-26 MED ORDER — MORPHINE SULFATE (PF) 2 MG/ML IV SOLN
0.5000 mg | INTRAVENOUS | Status: DC | PRN
  Administered 2024-11-26 – 2024-12-01 (×10): 0.5 mg via INTRAVENOUS
  Filled 2024-11-26 (×10): qty 1

## 2024-11-26 MED ORDER — METHOCARBAMOL 1000 MG/10ML IJ SOLN: 500.0000 mg | Freq: Four times a day (QID) | INTRAMUSCULAR | Status: DC | PRN

## 2024-11-26 MED ORDER — HEPARIN SODIUM (PORCINE) 5000 UNIT/ML IJ SOLN
5000.0000 [IU] | Freq: Three times a day (TID) | INTRAMUSCULAR | Status: DC
  Administered 2024-11-26: 5000 [IU] via SUBCUTANEOUS
  Filled 2024-11-26: qty 1

## 2024-11-26 MED ORDER — GUAIFENESIN-CODEINE 100-10 MG/5ML PO SOLN
5.0000 mL | ORAL | Status: DC | PRN
  Administered 2024-11-26 – 2024-11-28 (×7): 5 mL via ORAL
  Filled 2024-11-26 (×7): qty 5

## 2024-11-26 MED ORDER — PANTOPRAZOLE SODIUM 40 MG PO TBEC
40.0000 mg | DELAYED_RELEASE_TABLET | Freq: Every day | ORAL | Status: DC
  Administered 2024-11-26 – 2024-11-29 (×3): 40 mg via ORAL
  Filled 2024-11-26 (×3): qty 1

## 2024-11-26 MED ORDER — LACTATED RINGERS IV SOLN: INTRAVENOUS | Status: AC

## 2024-11-26 MED ORDER — LORAZEPAM 0.5 MG PO TABS
0.5000 mg | ORAL_TABLET | ORAL | Status: DC | PRN
  Administered 2024-11-26 – 2024-11-27 (×2): 0.5 mg via ORAL
  Filled 2024-11-26 (×2): qty 1

## 2024-11-26 MED ORDER — MILK AND MOLASSES ENEMA: 1.0000 | Freq: Once | RECTAL | Status: DC

## 2024-11-26 NOTE — ED Provider Notes (Signed)
 " [place] EMERGENCY DEPARTMENT AT [facility] Provider Note   CSN: [id number] Arrival date & time: 11/26/24  9161     Patient presents with: Casey [name] is a 64 y.o. female.   64 year old female presents for evaluation of fall.  She states she fell last night and was unable to get up.  States she laid on the floor for 7 hours.  She is complaining of left hip pain.  Did not hit her head or lose consciousness.  She is not on any blood thinners.  Denies any other symptoms or concerns at this time.  She is on 3 L nasal cannula at baseline.   Fall Pertinent negatives include no chest pain, no abdominal pain and no shortness of breath.       Prior to Admission medications  Medication Sig Start Date End Date Taking? Authorizing Provider  acetaminophen  (TYLENOL ) 325 MG tablet Take 2 tablets (650 mg total) by mouth every 6 (six) hours as needed for mild pain (or Fever >/= 101). 08/17/23   Pearlean Manus, MD  albuterol  (VENTOLIN  HFA) 108 (90 Base) MCG/ACT inhaler Inhale 2 puffs into the lungs every 4 (four) hours as needed for wheezing or shortness of breath. 05/31/24   Pearlean Manus, MD  azithromycin  (ZITHROMAX ) 250 MG tablet Take 2 on day one then 1 daily x 4 days 11/08/24   Darlean Ozell KATHEE, MD  budesonide  (PULMICORT ) 0.25 MG/2ML nebulizer solution Combine with albuterol  in nebulizer twice daily 11/08/24   Wert, Michael B, MD  citalopram  (CELEXA ) 40 MG tablet Take 1 tablet (40 mg total) by mouth daily. 09/25/24   Rankin, Shuvon B, NP  famotidine  (PEPCID ) 20 MG tablet One after supper 11/08/24   Darlean Ozell KATHEE, MD  gabapentin  (NEURONTIN ) 100 MG capsule Take 1 capsule (100 mg total) by mouth 3 (three) times daily. 09/25/24   Rankin, Shuvon B, NP  hydrOXYzine  (ATARAX ) 50 MG tablet Take 1 tablet (50 mg total) by mouth 3 (three) times daily as needed. 09/25/24   Rankin, Shuvon B, NP  ipratropium-albuterol  (DUONEB) 0.5-2.5 (3) MG/3ML SOLN Use  four times daily in nebulizer  11/08/24   Wert, Michael B, MD  OXYGEN Inhale 4 L into the lungs continuous.    [provider]  pantoprazole  (PROTONIX ) 40 MG tablet Take 1 tablet (40 mg total) by mouth daily. 05/31/24   Pearlean Manus, MD  predniSONE  (DELTASONE ) 10 MG tablet Take  4 each am x 2 days,   2 each am x 2 days,  1 each am x 2 days and stop 11/08/24   Darlean Ozell KATHEE, MD    Allergies: Methylprednisolone     Review of Systems  Constitutional:  Negative for chills and fever.  HENT:  Negative for ear pain and sore throat.   Eyes:  Negative for pain and visual disturbance.  Respiratory:  Negative for cough and shortness of breath.   Cardiovascular:  Negative for chest pain and palpitations.  Gastrointestinal:  Negative for abdominal pain and vomiting.  Genitourinary:  Negative for dysuria and hematuria.  Musculoskeletal:  Negative for arthralgias and back pain.       Admits left hip pain   Skin:  Negative for color change and rash.  Neurological:  Negative for seizures and syncope.  All other systems reviewed and are negative.   Updated Vital Signs BP 124/74   Pulse 92   Temp 98.3 F (36.8 C) (Oral)   Resp (!) 24   Ht 5'  5 (1.651 m)   Wt 59.9 kg   LMP  (LMP Unknown)   SpO2 91%   BMI 21.97 kg/m   Physical Exam Vitals and nursing note reviewed.  Constitutional:      General: She is not in acute distress.    Appearance: Normal appearance. She is well-developed. She is not ill-appearing.  HENT:     Head: Normocephalic and atraumatic.  Eyes:     Conjunctiva/sclera: Conjunctivae normal.  Cardiovascular:     Rate and Rhythm: Normal rate and regular rhythm.     Heart sounds: No murmur heard. Pulmonary:     Effort: Pulmonary effort is normal. No respiratory distress.     Breath sounds: Normal breath sounds.  Abdominal:     Palpations: Abdomen is soft.     Tenderness: There is no abdominal tenderness.  Musculoskeletal:        General: Swelling and tenderness present.     Cervical back:  Neck supple.     Comments: Left hip is swollen and tender to palpation, the leg is shortened internally rotated  Skin:    General: Skin is warm and dry.     Capillary Refill: Capillary refill takes less than 2 seconds.  Neurological:     General: No focal deficit present.     Mental Status: She is alert.  Psychiatric:        Mood and Affect: Mood normal.     (all labs ordered are listed, but only abnormal results are displayed) Labs Reviewed  CBC WITH DIFFERENTIAL/PLATELET - Abnormal; Notable for the following components:      Result Value   WBC 17.2 (*)    Neutro Abs 14.1 (*)    All other components within normal limits  BASIC METABOLIC PANEL WITH GFR - Abnormal; Notable for the following components:   Glucose, Bld 158 (*)    BUN 7 (*)    All other components within normal limits  CK  PROTIME-INR  HIV ANTIBODY (ROUTINE TESTING W REFLEX)  VITAMIN D  25 HYDROXY (VIT D DEFICIENCY, FRACTURES)  URINALYSIS, ROUTINE W REFLEX MICROSCOPIC  URINE DRUG SCREEN    EKG: EKG Interpretation Date/Time:  Sunday November 26 2024 09:32:28 EST Ventricular Rate:  86 PR Interval:  165 QRS Duration:  91 QT Interval:  380 QTC Calculation: 455 R Axis:   29  Text Interpretation: Sinus rhythm Probable anteroseptal infarct, old Compared with prior EKG from 06/15/2024 Confirmed by Gennaro Bouchard ([id number]) on 11/26/2024 9:57:09 AM  Radiology: [facility] Lumbar Spine Complete Result Date: 11/26/2024 EXAM: 4 VIEW(S) XRAY OF THE LUMBAR SPINE 11/26/2024 09:30:00 AM COMPARISON: CT 07/17/2023. CLINICAL HISTORY: fall, back pain FINDINGS: LUMBAR SPINE: BONES: Compression deformity of the T12 vertebral body not changed from comparison CT 07/17/2023. No acute loss of vertebral body height or disc height. Alignment is normal. No subluxation. No pars defect. DISCS AND DEGENERATIVE CHANGES: Degenerative spurring. No severe degenerative changes. SOFT TISSUES: Moderate volume of stool throughout the colon and a large volume in  the rectum. No acute abnormality. IMPRESSION: 1. No acute loss of vertebral body height or disc height. 2. Stable compression deformity of the T12 vertebral body compared to CT 07/17/23. 3. Moderate colonic stool burden with large rectal stool burden, which can be seen with constipation or fecal impaction. Electronically signed by: Norleen Boxer MD 11/26/2024 10:13 AM EST RP Workstation: [id number]   DG Chest Port 1 View Result Date: 11/26/2024 EXAM: 1 VIEW(S) X-RAY OF THE CHEST 11/26/2024 09:30:00 AM COMPARISON: 06/15/2024 CLINICAL HISTORY: Pre-op  chest exam FINDINGS: LUNGS AND PLEURA: Chronic interstitial prominence with diffuse bronchial wall thickening. No focal pulmonary opacity. No pleural effusion. No pneumothorax. HEART AND MEDIASTINUM: No acute abnormality of the cardiac and mediastinal silhouettes. BONES AND SOFT TISSUES: Left shoulder arthroplasty noted. Remote left rib fractures again noted. Remote right clavicle fracture. No acute osseous abnormality. IMPRESSION: 1. No acute findings. 2. Chronic interstitial prominence with diffuse bronchial wall thickening, most consistent with chronic bronchitis/COPD; differential includes asthma or smoking-related airway disease. Electronically signed by: Waddell Calk MD 11/26/2024 10:07 AM EST RP Workstation: [id number]   DG Hip Unilat With Pelvis 2-3 Views Left Result Date: 11/26/2024 EXAM: 2 OR 3 VIEW(S) XRAY OF THE LEFT HIP 11/26/2024 09:30:00 AM COMPARISON: None available. CLINICAL HISTORY: fall fall fall fall fall FINDINGS: BONES AND JOINTS: Comminuted intertrochanteric fracture of LEFT hip. Mild displacement of lesser trochanter. SOFT TISSUES: Moderate-sized desiccated stool ball within the rectum. IMPRESSION: 1. Comminuted intertrochanteric fracture of the left hip with mild displacement of the lesser trochanter. Electronically signed by: Waddell Calk MD 11/26/2024 10:05 AM EST RP Workstation: [id number]     Procedures   Medications Ordered in the  ED  HYDROcodone -acetaminophen  (NORCO/VICODIN) 5-325 MG per tablet 1-2 tablet (has no administration in time range)  morphine  (PF) 2 MG/ML injection 0.5 mg (0.5 mg Intravenous Given 11/26/24 1401)  heparin  injection 5,000 Units (has no administration in time range)  methocarbamol  (ROBAXIN ) tablet 500 mg (has no administration in time range)    Or  methocarbamol  (ROBAXIN ) injection 500 mg (has no administration in time range)  zolpidem  (AMBIEN ) tablet 5 mg (has no administration in time range)  docusate sodium  (COLACE) capsule 100 mg (has no administration in time range)  milk and molasses enema (has no administration in time range)  lactated ringers  infusion ( Intravenous New Bag/Given 11/26/24 1143)  citalopram  (CELEXA ) tablet 40 mg (40 mg Oral Given 11/26/24 1401)  gabapentin  (NEURONTIN ) capsule 100 mg (has no administration in time range)  ipratropium-albuterol  (DUONEB) 0.5-2.5 (3) MG/3ML nebulizer solution 3 mL (3 mLs Nebulization Not Given 11/26/24 1301)  pantoprazole  (PROTONIX ) EC tablet 40 mg (has no administration in time range)  budesonide  (PULMICORT ) nebulizer solution 0.25 mg (0.25 mg Nebulization Not Given 11/26/24 1300)  LORazepam  (ATIVAN ) tablet 0.5 mg (0.5 mg Oral Given 11/26/24 1401)  feeding supplement (ENSURE PLUS HIGH PROTEIN) liquid 237 mL (has no administration in time range)  multivitamin with minerals tablet 1 tablet (has no administration in time range)  guaiFENesin -codeine  100-10 MG/5ML solution 5 mL (has no administration in time range)  HYDROmorphone  (DILAUDID ) injection 0.5 mg (0.5 mg Intravenous Given 11/26/24 0857)  HYDROmorphone  (DILAUDID ) injection 0.5 mg (0.5 mg Intravenous Given 11/26/24 1114)                                    Medical Decision Making Cardiac monitor interpretation: Sinus rhythm, no ectopy  Patient here for a fall.  Found to have a left intertrochanteric fracture.  Discussed patient's case with Dr. Sherida, orthopedic surgery who recommend  admission to hospitalist service.  Lab workup fairly unremarkable otherwise and vitals are stable.  He is on 3 L nasal cannula at baseline for COPD.  She was given multiple doses of pain medication while in the ER.  Discussed patient's case with hospitalist and patient be admitted for further workup and management.  Patient is agreeable with the plan.  Problems Addressed: Acute midline low back pain without sciatica:  acute illness or injury Closed fracture of left hip, initial encounter [facility]): acute illness or injury that poses a threat to life or bodily functions Fall, initial encounter: acute illness or injury  Amount and/or Complexity of Data Reviewed External Data Reviewed: notes.    Details: Prior ED records reviewed and patient seen and treated for COPD exacerbation 8 - 7 - 25 Labs: ordered. Decision-making details documented in ED Course.    Details: Ordered and reviewed by me and unremarkable Radiology: ordered and independent interpretation performed. Decision-making details documented in ED Course.    Details: Ordered and interpreted by me independently of radiology Lumbar x-ray: Shows no acute abnormality Left hip x-ray: Shows left intertrochanteric fracture ECG/medicine tests: ordered and independent interpretation performed. Decision-making details documented in ED Course.    Details: Ordered and interpreted by me in the absence of cardiology and shows sinus rhythm, no STEMI, or significant change when compared to prior EKG Discussion of management or test interpretation with external provider(s): Dr. Sherida- orthopedic surgery - he recommended admission to hospitalist service and plan for OR tomorrow  Dr. Vicci - hospitalist - I spoke with him on the phone regarding the patient's case and he will admit the patient for further workup and management   Risk OTC drugs. Prescription drug management. Parenteral controlled substances. Drug therapy requiring intensive monitoring for  toxicity. Decision regarding hospitalization. Emergency major surgery.     Final diagnoses:  Closed fracture of left hip, initial encounter [facility])  Fall, initial encounter  Acute midline low back pain without sciatica    ED Discharge Orders     None          Gennaro Duwaine CROME, DO 11/26/24 1458  "

## 2024-11-26 NOTE — Progress Notes (Signed)
 Initial Nutrition Assessment  DOCUMENTATION CODES:  Not applicable  INTERVENTION:  Chocolate Ensure Plus High Protein po BID, each supplement provides 350 kcal and 20 grams of protein MVI with minerals daily  NUTRITION DIAGNOSIS:  Increased nutrient needs related to post-op healing, hip fracture as evidenced by estimated needs.  GOAL:  Patient will meet greater than or equal to 90% of their needs  MONITOR:  PO intake, Supplement acceptance, Skin  REASON FOR ASSESSMENT:  Consult Hip fracture protocol  ASSESSMENT:  64 yo female admitted with L hip fracture. 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  Unable to speak with patient at this time; she is currently in the ED.  Currently on a soft diet. Meal intakes not recorded. She will be NPO at midnight for surgery tomorrow. Plans for transfer to [facility] or [facility] for surgery.  Patient has a hx of malnutrition and poor intake d/t difficulty breathing. Malnutrition was resolved on last admission in July 2025. She likes chocolate Ensure supplements. Unable to obtain enough information at this time for identification of malnutrition. She has increased nutrition needs r/t healing post-op.  Labs reviewed.  Medications reviewed and include colace, milk & molasses enema, protonix . IVF: LR at 40 ml/h  Usual weight: 57.2 kg 09/19/24 Current weight: 59.9 kg (11/08/24 & 11/26/24)  NUTRITION - FOCUSED PHYSICAL EXAM: Unable to complete, RD working remotely  Diet Order:   Diet Order             Diet NPO time specified  Diet effective midnight           DIET SOFT Fluid consistency: Thin  Diet effective now                  EDUCATION NEEDS:  Not appropriate for education at this time  Skin:  Skin Assessment:  (no nursing assessment available for review)  Last BM:  no BM documented  Height:  Ht Readings from Last 1 Encounters:  11/26/24 5' 5 (1.651 m)   Weight:  Wt  Readings from Last 1 Encounters:  11/26/24 59.9 kg   Ideal Body Weight:  56.8 kg  BMI:  Body mass index is 21.97 kg/m.  Estimated Nutritional Needs:  Kcal:  1550-1750 Protein:  75-90 gm Fluid:  1.6-1.8 L   Suzen HUNT RD, LDN, CNSC Contact via secure chat. If unavailable, use group chat RD Inpatient.

## 2024-11-26 NOTE — ED Triage Notes (Signed)
 Per EMS pt coming from home fell in kitchen around midnight. Was laying on floor approx 7 hours. C/o left hip pain. Obvious swelling, shortening and rotating. Given 100 mcg fentanyl  en route. 20G RAC. On 3L [place] at baseline for COPD.

## 2024-11-26 NOTE — ED Notes (Signed)
 Per Dr. Vicci hold type and screen until after pt is transferred to [facility] or [facility].
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
## 2024-11-26 NOTE — Progress Notes (Signed)
 Patient arrived on floor, has a strong dry cough. On 3 L [place] at baseline. VS taken. Attending and ortho notified. Skin assessment completed. Patient is Ax4. C/O L hip pain. Cough medication was ordered and will be given. LR is infusing at 40 ml/hr. Bed locked lowest position, belongings and call light in place. Bed alarm on.

## 2024-11-26 NOTE — Plan of Care (Signed)
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
## 2024-11-27 ENCOUNTER — Inpatient Hospital Stay (HOSPITAL_COMMUNITY): Admitting: Anesthesiology

## 2024-11-27 ENCOUNTER — Inpatient Hospital Stay (HOSPITAL_COMMUNITY)

## 2024-11-27 ENCOUNTER — Encounter (HOSPITAL_COMMUNITY): Payer: Self-pay | Admitting: Family Medicine

## 2024-11-27 ENCOUNTER — Encounter (HOSPITAL_COMMUNITY)
Admission: EM | Disposition: A | Discharge status: Skilled Nursing Facility | Payer: Self-pay | Source: Home / Self Care | Attending: Internal Medicine

## 2024-11-27 DIAGNOSIS — F418 Other specified anxiety disorders: Secondary | ICD-10-CM

## 2024-11-27 DIAGNOSIS — Z87891 Personal history of nicotine dependence: Secondary | ICD-10-CM

## 2024-11-27 DIAGNOSIS — S72142A Displaced intertrochanteric fracture of left femur, initial encounter for closed fracture: Secondary | ICD-10-CM

## 2024-11-27 DIAGNOSIS — S72002A Fracture of unspecified part of neck of left femur, initial encounter for closed fracture: Secondary | ICD-10-CM | POA: Diagnosis not present

## 2024-11-27 DIAGNOSIS — J449 Chronic obstructive pulmonary disease, unspecified: Secondary | ICD-10-CM

## 2024-11-27 HISTORY — PX: INTRAMEDULLARY (IM) NAIL INTERTROCHANTERIC: SHX5875

## 2024-11-27 LAB — URINE DRUG SCREEN
Amphetamines: NEGATIVE
Barbiturates: NEGATIVE
Benzodiazepines: POSITIVE — AB
Cocaine: NEGATIVE
Fentanyl: POSITIVE — AB
Methadone Scn, Ur: NEGATIVE
Opiates: POSITIVE — AB
Tetrahydrocannabinol: NEGATIVE

## 2024-11-27 LAB — SURGICAL PCR SCREEN
MRSA, PCR: NEGATIVE
Staphylococcus aureus: NEGATIVE

## 2024-11-27 LAB — CBC
HCT: 30.1 % — ABNORMAL LOW (ref 36.0–46.0)
Hemoglobin: 9.7 g/dL — ABNORMAL LOW (ref 12.0–15.0)
MCH: 28.6 pg (ref 26.0–34.0)
MCHC: 32.2 g/dL (ref 30.0–36.0)
MCV: 88.8 fL (ref 80.0–100.0)
Platelets: 294 K/uL (ref 150–400)
RBC: 3.39 MIL/uL — ABNORMAL LOW (ref 3.87–5.11)
RDW: 14.4 % (ref 11.5–15.5)
WBC: 12.1 K/uL — ABNORMAL HIGH (ref 4.0–10.5)
nRBC: 0 % (ref 0.0–0.2)

## 2024-11-27 LAB — URINALYSIS, ROUTINE W REFLEX MICROSCOPIC
Bilirubin Urine: NEGATIVE
Glucose, UA: NEGATIVE mg/dL
Hgb urine dipstick: NEGATIVE
Ketones, ur: NEGATIVE mg/dL
Leukocytes,Ua: NEGATIVE
Nitrite: NEGATIVE
Protein, ur: NEGATIVE mg/dL
Specific Gravity, Urine: 1.015 (ref 1.005–1.030)
pH: 6 (ref 5.0–8.0)

## 2024-11-27 LAB — BASIC METABOLIC PANEL WITH GFR
Anion gap: 5 (ref 5–15)
BUN: 9 mg/dL (ref 8–23)
CO2: 33 mmol/L — ABNORMAL HIGH (ref 22–32)
Calcium: 8.5 mg/dL — ABNORMAL LOW (ref 8.9–10.3)
Chloride: 99 mmol/L (ref 98–111)
Creatinine, Ser: 0.63 mg/dL (ref 0.44–1.00)
GFR, Estimated: >60 mL/min
Glucose, Bld: 111 mg/dL — ABNORMAL HIGH (ref 70–99)
Potassium: 4 mmol/L (ref 3.5–5.1)
Sodium: 136 mmol/L (ref 135–145)

## 2024-11-27 MED ORDER — IPRATROPIUM-ALBUTEROL 0.5-2.5 (3) MG/3ML IN SOLN
3.0000 mL | Freq: Three times a day (TID) | RESPIRATORY_TRACT | Status: DC
  Filled 2024-11-27: qty 3

## 2024-11-27 MED ORDER — ONDANSETRON HCL 4 MG/2ML IJ SOLN
INTRAMUSCULAR | Status: DC | PRN
  Administered 2024-11-27: 4 mg via INTRAVENOUS

## 2024-11-27 MED ORDER — ROCURONIUM BROMIDE 10 MG/ML (PF) SYRINGE
PREFILLED_SYRINGE | INTRAVENOUS | Status: AC
  Filled 2024-11-27: qty 10

## 2024-11-27 MED ORDER — CEFAZOLIN SODIUM-DEXTROSE 2-3 GM-%(50ML) IV SOLR
INTRAVENOUS | Status: DC | PRN
  Administered 2024-11-27: 2 g via INTRAVENOUS

## 2024-11-27 MED ORDER — ALBUTEROL SULFATE HFA 108 (90 BASE) MCG/ACT IN AERS
INHALATION_SPRAY | RESPIRATORY_TRACT | Status: DC | PRN
  Administered 2024-11-27 (×2): 4 via RESPIRATORY_TRACT

## 2024-11-27 MED ORDER — ACETAMINOPHEN 325 MG PO TABS
325.0000 mg | ORAL_TABLET | Freq: Four times a day (QID) | ORAL | Status: DC | PRN
  Administered 2024-11-27: 650 mg via ORAL
  Filled 2024-11-27: qty 2

## 2024-11-27 MED ORDER — SODIUM CHLORIDE 0.9 % IV SOLN: INTRAVENOUS | Status: DC

## 2024-11-27 MED ORDER — CELECOXIB 200 MG PO CAPS
200.0000 mg | ORAL_CAPSULE | Freq: Two times a day (BID) | ORAL | Status: DC
  Administered 2024-11-27 – 2024-12-01 (×9): 200 mg via ORAL
  Filled 2024-11-27 (×9): qty 1

## 2024-11-27 MED ORDER — CEFAZOLIN SODIUM-DEXTROSE 2-4 GM/100ML-% IV SOLN
2.0000 g | Freq: Three times a day (TID) | INTRAVENOUS | Status: AC
  Administered 2024-11-27 – 2024-11-28 (×3): 2 g via INTRAVENOUS
  Filled 2024-11-27 (×4): qty 100

## 2024-11-27 MED ORDER — OXYCODONE HCL 5 MG PO TABS: 5.0000 mg | ORAL_TABLET | Freq: Once | ORAL | Status: DC | PRN

## 2024-11-27 MED ORDER — PROPOFOL 10 MG/ML IV BOLUS
INTRAVENOUS | Status: AC
  Filled 2024-11-27: qty 20

## 2024-11-27 MED ORDER — KETAMINE HCL 50 MG/5ML IJ SOSY
PREFILLED_SYRINGE | INTRAMUSCULAR | Status: AC
  Filled 2024-11-27: qty 5

## 2024-11-27 MED ORDER — FENTANYL CITRATE (PF) 100 MCG/2ML IJ SOLN
INTRAMUSCULAR | Status: AC
  Filled 2024-11-27: qty 2

## 2024-11-27 MED ORDER — OXYCODONE HCL 5 MG/5ML PO SOLN: 5.0000 mg | Freq: Once | ORAL | Status: DC | PRN

## 2024-11-27 MED ORDER — ORAL CARE MOUTH RINSE: 15.0000 mL | Freq: Once | OROMUCOSAL | Status: AC

## 2024-11-27 MED ORDER — HYDROXYZINE HCL 25 MG PO TABS: 50.0000 mg | ORAL_TABLET | Freq: Three times a day (TID) | ORAL | Status: DC | PRN

## 2024-11-27 MED ORDER — 0.9 % SODIUM CHLORIDE (POUR BTL) OPTIME
TOPICAL | Status: DC | PRN
  Administered 2024-11-27: 1000 mL

## 2024-11-27 MED ORDER — ROCURONIUM BROMIDE 10 MG/ML (PF) SYRINGE
PREFILLED_SYRINGE | INTRAVENOUS | Status: DC | PRN
  Administered 2024-11-27: 60 mg via INTRAVENOUS

## 2024-11-27 MED ORDER — MIDAZOLAM HCL 2 MG/2ML IJ SOLN
INTRAMUSCULAR | Status: AC
  Filled 2024-11-27: qty 2

## 2024-11-27 MED ORDER — IPRATROPIUM-ALBUTEROL 0.5-2.5 (3) MG/3ML IN SOLN
3.0000 mL | Freq: Three times a day (TID) | RESPIRATORY_TRACT | Status: DC
  Administered 2024-11-27 – 2024-11-30 (×8): 3 mL via RESPIRATORY_TRACT
  Filled 2024-11-27 (×8): qty 3

## 2024-11-27 MED ORDER — IPRATROPIUM-ALBUTEROL 0.5-2.5 (3) MG/3ML IN SOLN
3.0000 mL | RESPIRATORY_TRACT | Status: DC | PRN
  Administered 2024-11-29 (×2): 3 mL via RESPIRATORY_TRACT
  Filled 2024-11-27 (×2): qty 3

## 2024-11-27 MED ORDER — ONDANSETRON HCL 4 MG PO TABS: 4.0000 mg | ORAL_TABLET | Freq: Four times a day (QID) | ORAL | Status: DC | PRN

## 2024-11-27 MED ORDER — CHLORHEXIDINE GLUCONATE 0.12 % MT SOLN: 15.0000 mL | Freq: Once | OROMUCOSAL | Status: AC

## 2024-11-27 MED ORDER — HYDROMORPHONE HCL 1 MG/ML IJ SOLN
0.2500 mg | INTRAMUSCULAR | Status: DC | PRN
  Administered 2024-11-27: 0.5 mg via INTRAVENOUS

## 2024-11-27 MED ORDER — IPRATROPIUM-ALBUTEROL 0.5-2.5 (3) MG/3ML IN SOLN
RESPIRATORY_TRACT | Status: AC
  Filled 2024-11-27: qty 3

## 2024-11-27 MED ORDER — ALBUTEROL SULFATE HFA 108 (90 BASE) MCG/ACT IN AERS
INHALATION_SPRAY | RESPIRATORY_TRACT | Status: AC
  Filled 2024-11-27: qty 6.7

## 2024-11-27 MED ORDER — METOCLOPRAMIDE HCL 5 MG/ML IJ SOLN: 5.0000 mg | Freq: Three times a day (TID) | INTRAMUSCULAR | Status: DC | PRN

## 2024-11-27 MED ORDER — LACTATED RINGERS IV SOLN: INTRAVENOUS | Status: DC

## 2024-11-27 MED ORDER — CHLORHEXIDINE GLUCONATE 0.12 % MT SOLN
OROMUCOSAL | Status: AC
  Administered 2024-11-27: 15 mL via OROMUCOSAL
  Filled 2024-11-27: qty 15

## 2024-11-27 MED ORDER — PHENYLEPHRINE HCL-NACL 20-0.9 MG/250ML-% IV SOLN
INTRAVENOUS | Status: DC | PRN
  Administered 2024-11-27: 50 ug/min via INTRAVENOUS

## 2024-11-27 MED ORDER — VITAMIN D (ERGOCALCIFEROL) 1.25 MG (50000 UNIT) PO CAPS
50000.0000 [IU] | ORAL_CAPSULE | Freq: Once | ORAL | Status: AC
  Administered 2024-11-28: 50000 [IU] via ORAL
  Filled 2024-11-27: qty 1

## 2024-11-27 MED ORDER — PHENYLEPHRINE 80 MCG/ML (10ML) SYRINGE FOR IV PUSH (FOR BLOOD PRESSURE SUPPORT)
PREFILLED_SYRINGE | INTRAVENOUS | Status: DC | PRN
  Administered 2024-11-27: 160 ug via INTRAVENOUS
  Administered 2024-11-27: 120 ug via INTRAVENOUS
  Administered 2024-11-27: 40 ug via INTRAVENOUS
  Administered 2024-11-27: 80 ug via INTRAVENOUS
  Administered 2024-11-27: 120 ug via INTRAVENOUS

## 2024-11-27 MED ORDER — METOCLOPRAMIDE HCL 5 MG PO TABS: 5.0000 mg | ORAL_TABLET | Freq: Three times a day (TID) | ORAL | Status: DC | PRN

## 2024-11-27 MED ORDER — LACTATED RINGERS IV BOLUS
500.0000 mL | Freq: Once | INTRAVENOUS | Status: AC
  Administered 2024-11-27: 500 mL via INTRAVENOUS

## 2024-11-27 MED ORDER — ARFORMOTEROL TARTRATE 15 MCG/2ML IN NEBU
15.0000 ug | INHALATION_SOLUTION | Freq: Two times a day (BID) | RESPIRATORY_TRACT | Status: DC
  Administered 2024-11-27 – 2024-12-01 (×8): 15 ug via RESPIRATORY_TRACT
  Filled 2024-11-27 (×9): qty 2

## 2024-11-27 MED ORDER — LIDOCAINE 2% (20 MG/ML) 5 ML SYRINGE
INTRAMUSCULAR | Status: DC | PRN
  Administered 2024-11-27: 40 mg via INTRAVENOUS

## 2024-11-27 MED ORDER — MIDAZOLAM HCL (PF) 2 MG/2ML IJ SOLN: 0.5000 mg | Freq: Once | INTRAMUSCULAR | Status: DC | PRN

## 2024-11-27 MED ORDER — FENTANYL CITRATE (PF) 250 MCG/5ML IJ SOLN
INTRAMUSCULAR | Status: DC | PRN
  Administered 2024-11-27: 50 ug via INTRAVENOUS
  Administered 2024-11-27: 100 ug via INTRAVENOUS

## 2024-11-27 MED ORDER — ENOXAPARIN SODIUM 40 MG/0.4ML IJ SOSY
40.0000 mg | PREFILLED_SYRINGE | INTRAMUSCULAR | Status: DC
  Administered 2024-11-28 – 2024-12-01 (×4): 40 mg via SUBCUTANEOUS
  Filled 2024-11-27 (×4): qty 0.4

## 2024-11-27 MED ORDER — TRANEXAMIC ACID-NACL 1000-0.7 MG/100ML-% IV SOLN
1000.0000 mg | Freq: Once | INTRAVENOUS | Status: AC
  Administered 2024-11-27: 1000 mg via INTRAVENOUS
  Filled 2024-11-27: qty 100

## 2024-11-27 MED ORDER — POLYETHYLENE GLYCOL 3350 17 G PO PACK
17.0000 g | PACK | Freq: Every day | ORAL | Status: DC | PRN
  Administered 2024-11-29: 17 g via ORAL
  Filled 2024-11-27: qty 1

## 2024-11-27 MED ORDER — FENTANYL CITRATE (PF) 250 MCG/5ML IJ SOLN
INTRAMUSCULAR | Status: AC
  Filled 2024-11-27: qty 5

## 2024-11-27 MED ORDER — DIPHENHYDRAMINE HCL 12.5 MG/5ML PO ELIX: 12.5000 mg | ORAL_SOLUTION | ORAL | Status: DC | PRN

## 2024-11-27 MED ORDER — VITAMIN D 25 MCG (1000 UNIT) PO TABS
1000.0000 [IU] | ORAL_TABLET | Freq: Every day | ORAL | Status: DC
  Administered 2024-11-27 – 2024-12-01 (×5): 1000 [IU] via ORAL
  Filled 2024-11-27 (×5): qty 1

## 2024-11-27 MED ORDER — MEPERIDINE HCL 25 MG/ML IJ SOLN: 6.2500 mg | INTRAMUSCULAR | Status: DC | PRN

## 2024-11-27 MED ORDER — MIDAZOLAM HCL (PF) 2 MG/2ML IJ SOLN
INTRAMUSCULAR | Status: DC | PRN
  Administered 2024-11-27: 2 mg via INTRAVENOUS

## 2024-11-27 MED ORDER — ALBUMIN HUMAN 5 % IV SOLN: INTRAVENOUS | Status: DC | PRN

## 2024-11-27 MED ORDER — LIDOCAINE 2% (20 MG/ML) 5 ML SYRINGE
INTRAMUSCULAR | Status: AC
  Filled 2024-11-27: qty 5

## 2024-11-27 MED ORDER — KETAMINE HCL 50 MG/5ML IJ SOSY
PREFILLED_SYRINGE | INTRAMUSCULAR | Status: DC | PRN
  Administered 2024-11-27: 10 mg via INTRAVENOUS

## 2024-11-27 MED ORDER — PROPOFOL 10 MG/ML IV BOLUS
INTRAVENOUS | Status: DC | PRN
  Administered 2024-11-27: 120 mg via INTRAVENOUS

## 2024-11-27 MED ORDER — SUGAMMADEX SODIUM 200 MG/2ML IV SOLN
INTRAVENOUS | Status: DC | PRN
  Administered 2024-11-27: 200 mg via INTRAVENOUS

## 2024-11-27 MED ORDER — HYDROMORPHONE HCL 1 MG/ML IJ SOLN
INTRAMUSCULAR | Status: AC
  Filled 2024-11-27: qty 1

## 2024-11-27 MED ORDER — ONDANSETRON HCL 4 MG/2ML IJ SOLN
4.0000 mg | Freq: Four times a day (QID) | INTRAMUSCULAR | Status: DC | PRN
  Administered 2024-12-01: 4 mg via INTRAVENOUS
  Filled 2024-11-27: qty 2

## 2024-11-27 MED ORDER — IPRATROPIUM-ALBUTEROL 0.5-2.5 (3) MG/3ML IN SOLN
3.0000 mL | RESPIRATORY_TRACT | Status: DC
  Administered 2024-11-27: 3 mL via RESPIRATORY_TRACT

## 2024-11-27 MED ORDER — HYDROCODONE-ACETAMINOPHEN 5-325 MG PO TABS
1.0000 | ORAL_TABLET | Freq: Four times a day (QID) | ORAL | Status: DC | PRN
  Administered 2024-11-28 (×2): 2 via ORAL
  Filled 2024-11-27 (×2): qty 2

## 2024-11-27 NOTE — Plan of Care (Signed)
   Problem: Education: Goal: Knowledge of General Education information will improve Description: Including pain rating scale, medication(s)/side effects and non-pharmacologic comfort measures Outcome: Progressing   Problem: Elimination: Goal: Will not experience complications related to urinary retention Outcome: Progressing   Problem: Pain Managment: Goal: General experience of comfort will improve and/or be controlled Outcome: Progressing

## 2024-11-27 NOTE — Anesthesia Preprocedure Evaluation (Addendum)
"                                    Anesthesia Evaluation  Patient identified by MRN, date of birth, ID band Patient awake    Reviewed: Allergy & Precautions, NPO status , Patient's Chart, lab work & pertinent test results  History of Anesthesia Complications Negative for: history of anesthetic complications  Airway Mallampati: II  TM Distance: >3 FB Neck ROM: Full    Dental  (+) Edentulous Upper, Dental Advisory Given, Poor Dentition   Pulmonary COPD,  COPD inhaler and oxygen dependent, former smoker  Distant breath sounds bilat   + decreased breath sounds      Cardiovascular negative cardio ROS  Rhythm:Regular Rate:Normal     Neuro/Psych   Anxiety Depression    negative neurological ROS     GI/Hepatic ,GERD  Medicated and Controlled,,(+)     substance abuse (percocet daily)  cocaine use and marijuana use  Endo/Other  negative endocrine ROS    Renal/GU negative Renal ROS     Musculoskeletal  (+)  narcotic dependent  Abdominal   Peds  Hematology  (+) Blood dyscrasia (Hb 9.7, plt 294k), anemia   Anesthesia Other Findings fall at home last night in her kitchen  Reproductive/Obstetrics                              Anesthesia Physical Anesthesia Plan  ASA: 3  Anesthesia Plan: General   Post-op Pain Management: Tylenol  PO (pre-op)*   Induction: Intravenous  PONV Risk Score and Plan: 3 and Ondansetron , Dexamethasone  and Treatment may vary due to age or medical condition  Airway Management Planned: Oral ETT  Additional Equipment: None  Intra-op Plan:   Post-operative Plan: Extubation in OR  Informed Consent: I have reviewed the patients History and Physical, chart, labs and discussed the procedure including the risks, benefits and alternatives for the proposed anesthesia with the patient or authorized representative who has indicated his/her understanding and acceptance.     Dental advisory given  Plan  Discussed with: CRNA and Surgeon  Anesthesia Plan Comments: (Preop albuterol )         Anesthesia Quick Evaluation  "

## 2024-11-27 NOTE — H&P (View-Only) (Signed)
 Reason for Consult:Left hip fx Referring Physician: Owen Lore Time called: 0820 Time at bedside: 0943   Casey [name] is an 64 y.o. female.  HPI: Orvetta got on a chair to retrieve her phone when the chair shifted and caused her to fall. She had immediate left hip pain and could not get up. She lay on the floor for about 9h until she could get to the phone and call for help. She was brought to the ED at [facility] where x-rays showed a left hip fx. Orthopedic surgery was consulted and she was transferred to [facility] for definitive treatment. She lives at home alone and uses a RW for ambulation.  Past Medical History:  Diagnosis Date   Anxiety    Chronic back pain    COPD (chronic obstructive pulmonary disease) (HCC)    Depression    History of panic attacks 09/11/2014   Medical history non-contributory    Pneumonia    Polysubstance abuse (HCC)    History of taking non-prescribed opiates, BZDs; also +cocaine and THC   Ruptured disc, thoracic    Suicide attempt (HCC) 2002    Past Surgical History:  Procedure Laterality Date   CESAREAN SECTION     CLAVICLE SURGERY     REVERSE SHOULDER ARTHROPLASTY Left 01/01/2020   Procedure: LEFT REVERSE SHOULDER ARTHROPLASTY;  Surgeon: Addie Cordella Hamilton, MD;  Location: [facility] OR;  Service: Orthopedics;  Laterality: Left;   TUBAL LIGATION      Family History  Problem Relation Age of Onset   Diabetes Mother    Pulmonary fibrosis Mother    Heart attack Father    Parkinson's disease Sister    Bipolar disorder Sister    Thyroid  disease Sister    Heart attack Brother    Other Son        vascular neucrosis   Pulmonary fibrosis Maternal Grandmother    Diabetes Maternal Grandfather    Stroke Maternal Grandfather    Pneumonia Paternal Grandfather    Cancer Sister        melonoma   Other Brother        back problems, nerve problems, soft bones    Social History:  reports that she has quit smoking. Her smoking use included cigarettes. She has never  used smokeless tobacco. She reports that she does not currently use drugs after having used the following drugs: Crack cocaine and Benzodiazepines. No history on file for alcohol use.  Allergies: Allergies[1]  Medications: I have reviewed the patient's current medications.  Results for orders placed or performed during the hospital encounter of 11/26/24 (from the past 48 hours)  CBC with Differential     Status: Abnormal   Collection Time: 11/26/24  8:50 AM  Result Value Ref Range   WBC 17.2 (H) 4.0 - 10.5 K/uL   RBC 4.22 3.87 - 5.11 MIL/uL   Hemoglobin 12.0 12.0 - 15.0 g/dL   HCT 62.6 63.9 - 53.9 %   MCV 88.4 80.0 - 100.0 fL   MCH 28.4 26.0 - 34.0 pg   MCHC 32.2 30.0 - 36.0 g/dL   RDW 85.3 88.4 - 84.4 %   Platelets 379 150 - 400 K/uL   nRBC 0.0 0.0 - 0.2 %   Neutrophils Relative % 83 %   Neutro Abs 14.1 (H) 1.7 - 7.7 K/uL   Lymphocytes Relative 11 %   Lymphs Abs 2.0 0.7 - 4.0 K/uL   Monocytes Relative 6 %   Monocytes Absolute 1.0 0.1 - 1.0 K/uL  Eosinophils Relative 0 %   Eosinophils Absolute 0.1 0.0 - 0.5 K/uL   Basophils Relative 0 %   Basophils Absolute 0.1 0.0 - 0.1 K/uL   Immature Granulocytes 0 %   Abs Immature Granulocytes 0.07 0.00 - 0.07 K/uL    Comment: Performed at [facility], [address], [place], [place] [address]  Basic metabolic panel     Status: Abnormal   Collection Time: 11/26/24  8:50 AM  Result Value Ref Range   Sodium 139 135 - 145 mmol/L   Potassium 4.3 3.5 - 5.1 mmol/L   Chloride 100 98 - 111 mmol/L   CO2 28 22 - 32 mmol/L   Glucose, Bld 158 (H) 70 - 99 mg/dL    Comment: Glucose reference range applies only to samples taken after fasting for at least 8 hours.   BUN 7 (L) 8 - 23 mg/dL   Creatinine, Ser 9.46 0.44 - 1.00 mg/dL   Calcium  9.0 8.9 - 10.3 mg/dL   GFR, Estimated >39 >39 mL/min    Comment: (NOTE) Calculated using the CKD-EPI Creatinine Equation (2021)    Anion gap 12 5 - 15    Comment: Performed at [facility], [address] [place], [place] [address]  CK     Status: None   Collection Time: 11/26/24  8:50 AM  Result Value Ref Range   Total CK 162 38 - 234 U/L    Comment: Performed at [facility], [address] [place], [place] [address]  Protime-INR     Status: None   Collection Time: 11/26/24  8:50 AM  Result Value Ref Range   Prothrombin Time 13.4 11.4 - 15.2 seconds   INR 1.0 0.8 - 1.2    Comment: (NOTE) INR goal varies based on device and disease states. Performed at [facility], [address] [place], [place] [address]   HIV Antibody (routine testing w rflx)     Status: None   Collection Time: 11/26/24 11:22 AM  Result Value Ref Range   HIV Screen 4th Generation wRfx Non Reactive Non Reactive    Comment: Performed at [facility], [address] [place], [place] [address]  VITAMIN D  25 Hydroxy (Vit-D Deficiency, Fractures)     Status: Abnormal   Collection Time: 11/26/24 11:22 AM  Result Value Ref Range   Vit D, 25-Hydroxy 21.2 (L) 30 - 100 ng/mL    Comment: (NOTE) Vitamin D  deficiency has been defined by the Institute of Medicine  and an Endocrine Society practice guideline as a level of serum 25-OH  vitamin D  less than 20 ng/mL (1,2). The Endocrine Society went on to  further define vitamin D  insufficiency as a level between 21 and 29  ng/mL (2).  1. IOM (Institute of Medicine). 2010. Dietary reference intakes for  calcium  and D. [place]: [employer]. 2. Holick MF, [place], Bischoff-Ferrari HA, et al. Evaluation,  treatment, and prevention of vitamin D  deficiency: an Endocrine  Society clinical practice guideline, JCEM. 2011 Jul; 96(7): 1911-30.  Performed at [facility], [address], [place], [place] [address]   Urinalysis, Routine w reflex microscopic -Urine, Clean Catch     Status: None   Collection Time: 11/26/24 11:46 PM  Result Value Ref Range   Color, Urine YELLOW YELLOW   APPearance CLEAR CLEAR   Specific Gravity, Urine 1.015 1.005  - 1.030   pH 6.0 5.0 - 8.0   Glucose, UA NEGATIVE NEGATIVE mg/dL  Hgb urine dipstick NEGATIVE NEGATIVE   Bilirubin Urine NEGATIVE NEGATIVE   Ketones, ur NEGATIVE NEGATIVE mg/dL   Protein, ur NEGATIVE NEGATIVE mg/dL   Nitrite NEGATIVE NEGATIVE   Leukocytes,Ua NEGATIVE NEGATIVE    Comment: Performed at [facility], [address], [place], [place] [address]  Urine Drug Screen     Status: Abnormal   Collection Time: 11/26/24 11:46 PM  Result Value Ref Range   Opiates POSITIVE (A) NEGATIVE   Cocaine NEGATIVE NEGATIVE   Benzodiazepines POSITIVE (A) NEGATIVE   Amphetamines NEGATIVE NEGATIVE   Tetrahydrocannabinol NEGATIVE NEGATIVE   Barbiturates NEGATIVE NEGATIVE   Methadone Scn, Ur NEGATIVE NEGATIVE   Fentanyl  POSITIVE (A) NEGATIVE    Comment: (NOTE) Drug screen is for Medical Purposes only. Positive results are preliminary only. If confirmation is needed, notify lab within 5 days.  Drug Class                 Cutoff (ng/mL) Amphetamine and metabolites 1000 Barbiturate and metabolites 200 Benzodiazepine              200 Opiates and metabolites     300 Cocaine and metabolites     300 THC                         50 Fentanyl                     5 Methadone                   300  Trazodone  is metabolized in vivo to several metabolites,  including pharmacologically active m-CPP, which is excreted in the  urine.  Immunoassay screens for amphetamines and MDMA have potential  cross-reactivity with these compounds and may provide false positive  result.  Performed at [facility], [address] [place], [place] [address]   CBC     Status: Abnormal   Collection Time: 11/27/24  4:11 AM  Result Value Ref Range   WBC 12.1 (H) 4.0 - 10.5 K/uL   RBC 3.39 (L) 3.87 - 5.11 MIL/uL   Hemoglobin 9.7 (L) 12.0 - 15.0 g/dL   HCT 69.8 (L) 63.9 - 53.9 %   MCV 88.8 80.0 - 100.0 fL   MCH 28.6 26.0 - 34.0 pg   MCHC 32.2 30.0 - 36.0 g/dL   RDW 85.5 88.4 - 84.4 %   Platelets 294  150 - 400 K/uL   nRBC 0.0 0.0 - 0.2 %    Comment: Performed at [facility], [name], [place], [place] [address]  Basic metabolic panel     Status: Abnormal   Collection Time: 11/27/24  4:11 AM  Result Value Ref Range   Sodium 136 135 - 145 mmol/L   Potassium 4.0 3.5 - 5.1 mmol/L   Chloride 99 98 - 111 mmol/L   CO2 33 (H) 22 - 32 mmol/L   Glucose, Bld 111 (H) 70 - 99 mg/dL    Comment: Glucose reference range applies only to samples taken after fasting for at least 8 hours.   BUN 9 8 - 23 mg/dL   Creatinine, Ser 9.36 0.44 - 1.00 mg/dL   Calcium  8.5 (L) 8.9 - 10.3 mg/dL   GFR, Estimated >39 >39 mL/min    Comment: (NOTE) Calculated using the CKD-EPI Creatinine Equation (2021)    Anion gap 5 5 - 15    Comment: Performed at [facility]  [facility], [address] [place], [place] [address]    DG Lumbar Spine Complete Result Date: 11/26/2024 EXAM: 4 VIEW(S) XRAY OF THE LUMBAR SPINE 11/26/2024 09:30:00 AM COMPARISON: CT 07/17/2023. CLINICAL HISTORY: fall, back pain FINDINGS: LUMBAR SPINE: BONES: Compression deformity of the T12 vertebral body not changed from comparison CT 07/17/2023. No acute loss of vertebral body height or disc height. Alignment is normal. No subluxation. No pars defect. DISCS AND DEGENERATIVE CHANGES: Degenerative spurring. No severe degenerative changes. SOFT TISSUES: Moderate volume of stool throughout the colon and a large volume in the rectum. No acute abnormality. IMPRESSION: 1. No acute loss of vertebral body height or disc height. 2. Stable compression deformity of the T12 vertebral body compared to CT 07/17/23. 3. Moderate colonic stool burden with large rectal stool burden, which can be seen with constipation or fecal impaction. Electronically signed by: Norleen Boxer MD 11/26/2024 10:13 AM EST RP Workstation: [id number]   DG Chest Port 1 View Result Date: 11/26/2024 EXAM: 1 VIEW(S) X-RAY OF THE CHEST 11/26/2024 09:30:00 AM COMPARISON: 06/15/2024 CLINICAL  HISTORY: Pre-op chest exam FINDINGS: LUNGS AND PLEURA: Chronic interstitial prominence with diffuse bronchial wall thickening. No focal pulmonary opacity. No pleural effusion. No pneumothorax. HEART AND MEDIASTINUM: No acute abnormality of the cardiac and mediastinal silhouettes. BONES AND SOFT TISSUES: Left shoulder arthroplasty noted. Remote left rib fractures again noted. Remote right clavicle fracture. No acute osseous abnormality. IMPRESSION: 1. No acute findings. 2. Chronic interstitial prominence with diffuse bronchial wall thickening, most consistent with chronic bronchitis/COPD; differential includes asthma or smoking-related airway disease. Electronically signed by: Waddell Calk MD 11/26/2024 10:07 AM EST RP Workstation: [id number]   DG Hip Unilat With Pelvis 2-3 Views Left Result Date: 11/26/2024 EXAM: 2 OR 3 VIEW(S) XRAY OF THE LEFT HIP 11/26/2024 09:30:00 AM COMPARISON: None available. CLINICAL HISTORY: fall fall fall fall fall FINDINGS: BONES AND JOINTS: Comminuted intertrochanteric fracture of LEFT hip. Mild displacement of lesser trochanter. SOFT TISSUES: Moderate-sized desiccated stool ball within the rectum. IMPRESSION: 1. Comminuted intertrochanteric fracture of the left hip with mild displacement of the lesser trochanter. Electronically signed by: Waddell Calk MD 11/26/2024 10:05 AM EST RP Workstation: [id number]    Review of Systems  HENT:  Negative for ear discharge, ear pain, hearing loss and tinnitus.   Eyes:  Negative for photophobia and pain.  Respiratory:  Negative for cough and shortness of breath.   Cardiovascular:  Negative for chest pain.  Gastrointestinal:  Negative for abdominal pain, nausea and vomiting.  Genitourinary:  Negative for dysuria, flank pain, frequency and urgency.  Musculoskeletal:  Positive for arthralgias (Left hip). Negative for back pain, myalgias and neck pain.  Neurological:  Negative for dizziness and headaches.  Hematological:  Does not  bruise/bleed easily.  Psychiatric/Behavioral:  The patient is not nervous/anxious.    Blood pressure 110/68, pulse 90, temperature 97.7 F (36.5 C), temperature source Oral, resp. rate 18, height 5' 5 (1.651 m), weight 59.9 kg, SpO2 96%. Physical Exam Constitutional:      General: She is not in acute distress.    Appearance: She is well-developed. She is not diaphoretic.  HENT:     Head: Normocephalic and atraumatic.  Eyes:     General: No scleral icterus.       Right eye: No discharge.        Left eye: No discharge.     Conjunctiva/sclera: Conjunctivae normal.  Cardiovascular:     Rate and Rhythm: Normal rate and regular rhythm.  Pulmonary:     Effort:  Pulmonary effort is normal. No respiratory distress.  Musculoskeletal:     Cervical back: Normal range of motion.     Comments: LLE No traumatic wounds, ecchymosis, or rash  Mild TTP hip  No knee or ankle effusion  Knee stable to varus/ valgus and anterior/posterior stress  Sens DPN, SPN, TN intact  Motor EHL, ext, flex, evers 5/5  DP 1+, PT 0, No significant edema  Skin:    General: Skin is warm and dry.  Neurological:     Mental Status: She is alert.  Psychiatric:        Mood and Affect: Mood normal.        Behavior: Behavior normal.     Assessment/Plan: Left hip fx -- Plan IMN today with Dr. Kendal. Please keep NPO. Multiple medical problems including Gold 3 COPD on 3L, GAD, polysubstance abuse history, tobacco abuser, and chronic back pain -- per primary service    Ozell DOROTHA Ned, PA-C Orthopedic Surgery [phone] 11/27/2024, 9:52 AM     [1]  Allergies Allergen Reactions   Methylprednisolone  Anxiety

## 2024-11-27 NOTE — Progress Notes (Signed)
" °   11/27/24 1604  SDOH Interventions  Transportation Interventions Community Resources Provided    "

## 2024-11-27 NOTE — Op Note (Signed)
 Orthopaedic Surgery Operative Note (CSN: [id number] ) Date of Surgery: 11/27/2024  Admit Date: 11/26/2024   Diagnoses: Pre-Op Diagnoses: Left intertrochanteric femur fracture  Post-Op Diagnosis: Same  Procedures: CPT 27245-Cephalomedullary nailing of left intertrochanteric femur fracture  Surgeons : Primary: Kendal Franky SQUIBB, MD  Assistant: Lauraine Moores, PA-C  Location: OR 3   Anesthesia: General   Antibiotics: Ancef  2g preop   Tourniquet time: None    Estimated Blood Loss: 100 mL  Complications:* No complications entered in OR log *   Specimens:* No specimens in log *   Implants: Implant Name Type Inv. Item Serial No. Manufacturer Lot No. LRB No. Used Action  NAIL INTERTAN 10X18 130D 10S - [id number] Nail NAIL INTERTAN 10X18 130D 10S  [employer] [id number] Left 1 Implanted  SCREW LAG COMPR KIT 95/90 - [id number] Screw SCREW LAG COMPR KIT 95/90  [employer] [id number] Left 1 Implanted  SCREW TRIGEN LOW PROF 5.0X35 - [id number] Screw SCREW TRIGEN LOW PROF 5.0X35  [employer] [id number] Left 1 Implanted     Indications for Surgery: 64 year old female who sustained a ground-level fall with a left intertrochanteric femur fracture.  Due to the unstable nature of her injury I recommend proceeding with cephalomedullary nailing of the left hip.  Risks and benefits were discussed with the patient.  Risks included but not limited to bleeding, infection, malunion, nonunion, hardware failure, hardware rotation, nerve and blood vessel injury, DVT, even the possibility anesthetic complications.  She agreed to proceed with surgery and consent was obtained.  Operative Findings: 1.  Cephalomedullary nailing of left intertrochanteric femur fracture using [employer] InterTAN 10 x 180 mm nail with a 95 mm lag screw and 90 mm compression screw  Procedure: The patient was identified in the preoperative holding area. Consent was confirmed with  the patient and their family and all questions were answered. The operative extremity was marked after confirmation with the patient. she was then brought back to the operating room by our anesthesia colleagues.  She was placed under general anesthetic and carefully transferred over to the [facility] table.  Fluoroscopic imaging showed the unstable nature of her injury.  Traction was applied to the lower extremity and alignment was maintained.  The left hip was prepped and draped in usual sterile fashion.  A timeout was performed to verify the patient, the procedure, and the extremity.  Preoperative antibiotics were dosed.  A small incision proximal to the greater trochanter was made and carried down through skin and subcutaneous tissue.  Threaded guidewire was directed at the tip of the greater trochanter and advanced into the proximal metaphysis.  An entry reamer was then used to enter the medullary canal.  I then passed a 10 x 180 mm nail down the central canal attached to the targeting arm.  I then directed the threaded guidewire up into the head/neck segment.  I confirmed adequate tip apex distance and decided using 95 mm lag screw.  I then drilled the path for the compression screw and placed an antirotation bar.  I then drilled the path for the lag screw and placed a 95 mm lag screw.  I then used a compression screw to compress approximately 5 mm.  Excellent fixation was obtained.  I statically locked the proximal portion of the nail.  I then used the targeting arm to place a lateral to medial distal interlocking screw.  Final fluoroscopic imaging was obtained.  The incisions were irrigated and closed with 2-0  Monocryl and Dermabond.  Sterile dressings were applied.  The patient was then awoke from anesthesia and taken to the PACU in stable condition.  Post Op Plan/Instructions: The patient be weightbearing as tolerated to the left lower extremity.  She will receive postoperative Ancef .  She will receive  Lovenox  for DVT prophylaxis and discharged on aspirin  81 mg.  Will have her mobilize with physical and Occupational Therapy.  I was present and performed the entire surgery.  Lauraine Moores, PA-C did assist me throughout the case. An assistant was necessary given the difficulty in approach, maintenance of reduction and ability to instrument the fracture.   Franky Light, MD [facility]

## 2024-11-27 NOTE — Plan of Care (Signed)
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
## 2024-11-27 NOTE — Anesthesia Procedure Notes (Signed)
 Procedure Name: Intubation Date/Time: 11/27/2024 11:58 AM  Performed by: Marva Lonni PARAS, CRNAPre-anesthesia Checklist: Patient identified, Emergency Drugs available, Suction available and Patient being monitored Patient Re-evaluated:Patient Re-evaluated prior to induction Oxygen Delivery Method: Circle System Utilized Preoxygenation: Pre-oxygenation with 100% oxygen Induction Type: IV induction Ventilation: Mask ventilation without difficulty Laryngoscope Size: Mac and 3 Grade View: Grade I Tube type: Oral Number of attempts: 1 Airway Equipment and Method: Stylet Placement Confirmation: ETT inserted through vocal cords under direct vision, positive ETCO2 and breath sounds checked- equal and bilateral Secured at: 20 cm Tube secured with: Tape Dental Injury: Teeth and Oropharynx as per pre-operative assessment

## 2024-11-27 NOTE — Progress Notes (Signed)
" °   11/27/24 1604  SDOH Interventions  Food Insecurity Interventions Community Resources Provided    "

## 2024-11-27 NOTE — Transfer of Care (Signed)
 Immediate Anesthesia Transfer of Care Note  Patient: Casey [name]  Procedure(s) Performed: SURGICAL FIXATION OF LEFT HIP (Left: Hip)  Patient Location: PACU  Anesthesia Type:General  Level of Consciousness: drowsy and patient cooperative  Airway & Oxygen Therapy: Patient Spontanous Breathing and Patient connected to face mask oxygen  Post-op Assessment: Report given to RN and Post -op Vital signs reviewed and stable  Post vital signs: Reviewed and stable  Last Vitals:  Vitals Value Taken Time  BP 140/103 11/27/24 13:03  Temp    Pulse 110 11/27/24 13:06  Resp 25 11/27/24 13:06  SpO2 87 % 11/27/24 13:06  Vitals shown include unfiled device data.  Last Pain:  Vitals:   11/27/24 1038  TempSrc: Oral  PainSc: 6          Complications: No notable events documented.

## 2024-11-27 NOTE — Anesthesia Postprocedure Evaluation (Signed)
"   Anesthesia Post Note  Patient: Casey [name]  Procedure(s) Performed: SURGICAL FIXATION OF LEFT HIP (Left: Hip)     Patient location during evaluation: PACU Anesthesia Type: General Level of consciousness: awake and alert, oriented and patient cooperative Pain management: pain level controlled Vital Signs Assessment: post-procedure vital signs reviewed and stable Respiratory status: spontaneous breathing, nonlabored ventilation, respiratory function stable and patient connected to nasal cannula oxygen Cardiovascular status: blood pressure returned to baseline and stable Postop Assessment: no apparent nausea or vomiting Anesthetic complications: no Comments: Pt with persistent cough, was present preop, treated with albuterol  MDI and DuoNeb periop without change   No notable events documented.  Last Vitals:  Vitals:   11/27/24 1410 11/27/24 1415  BP: 119/64 115/62  Pulse: (!) 108 (!) 108  Resp: (!) 23 (!) 22  Temp:  36.7 C  SpO2: 90% 93%    Last Pain:  Vitals:   11/27/24 1415  TempSrc: Oral  PainSc:                  Salathiel Ferrara,E. Gavyn Ybarra      "

## 2024-11-27 NOTE — Interval H&P Note (Signed)
 History and Physical Interval Note:  11/27/2024 10:55 AM  Casey [name]  has presented today for surgery, with the diagnosis of Left femur fracture.  The various methods of treatment have been discussed with the patient and family. After consideration of risks, benefits and other options for treatment, the patient has consented to  Procedures: FIXATION, FRACTURE, INTERTROCHANTERIC, WITH INTRAMEDULLARY ROD (Left) as a surgical intervention.  The patient's history has been reviewed, patient examined, no change in status, stable for surgery.  I have reviewed the patient's chart and labs.  Questions were answered to the patient's satisfaction.     Versie Soave P Mikal Wisman

## 2024-11-27 NOTE — Discharge Instructions (Addendum)
 TRANSPORTATION: -[place] Department of Health: Call [facility] and [employer] at [phone] for details. [email or web address]  -[employer] [place]: [employer] [place] is the [employer]'s shared-ride transportation service for eligible riders who have a disability that prevents them from riding the fixed route bus. Call [phone]. [employer] [place] riders must pay a fare of $1.50 per trip, or may purchase a 10-ride punch card for $14.00 ($1.40 per ride) or a 40-ride punch card for $48.00 ($1.20 per ride).  -The [employer] transportation service is provided for senior citizens (60+) who live independently within [place] city limits and are unable to drive or have limited access to transportation. Call [phone] to schedule an appointment.  -[place] Transportation: For Medicare or Medicaid recipients call [phone]?[facility] Ambulance, wheelchair fleeta, and ambulatory quotes available.   MEDICAID TRANSPORTATION: -If you have a Medicaid blue card or pink card and have no other means for transportation to doctor's offices, clinics, dentists, hospitals, and other health related trip needs.  -Transportation services are available to all [place] locations. Trips to [facility] [place] are provided in association with [employer]. -Services are provided between 6:00AM and 9:00PM Monday-Friday. -Call [phone] to schedule a trip or request further information.     Psychiatry and Counseling [facility] [address] [place], [place] [phone]  Psychiatrists [facility]    [facility] [address]    [address] [place], [place] [address]     [place], [place] [address] [phone]      [phone]  Dr. Elna Lo     [facility] [address]    [address]  [place] [address]     [place] [address] [phone]      [phone]  Ima Anon, MD     [facility] [address]     [address] [place] [address]     [place] [address] [phone]        [phone]  Therapists [facility]    [facility] [address]    [address] [place], [place] [phone]      [phone]  [facility]  [facility] [address]     [address] [place] [address]     [place], [place] [phone]      [phone]   [facility]    [facility] [address]    [address] [place], [place] [address]     [place], [place] [address] [phone]      [phone]  [facility]   [facility] [address]     [address] [place], [place] [address]     [place], [place] [address] [phone]      [phone]  Valeri Milian [address] [place] [phone] PANTRY 3rd Th: 9a - 12p; 3rd Th: 4p - 7p  [facility] [employer] [facility] [address] [place] [phone] PANTRY varies - call agency  [address] [facility] [address] [place] [phone] PANTRY 2nd & 4th Saturday 11am-1pm  [facility] [address] [place] [phone] PANTRY by appointment Sa: 10:30a - 11:30a  [facility] [address] [place] [phone] PANTRY Sa: 10a -  12p  on varying weeks; call agency for schedule  [facility] [address] [place] [phone] PANTRY Tu : 10a - 12p; W: 10a - 12p; Th: 10a - 12p  Caprice Anitra Donn[facility] [address] [place] [phone] PANTRY 1st & 3rd Tu: 10a - 1p; 3rd Sa: 10a - 12p  [facility] [address] [place]  [phone] PANTRY W (By Appointment only - call prior to W to make an appointment)  [facility] [address] [place] [phone] MARINE M[place]ING M - F: times and dates vary - call agency  [facility] [address] [place] [phone] PANTRY 1st & 3rd Th: 10a-2p  [facility] [employer] [facility] [address] [place] [phone] PANTRY 3rd Sat of month: 11a-1p  [employer] [address] [place] [phone] [place] 4th [place]: 11a - 2p  [facility] / [facility] [employer] [address] [place] [phone] PANTRY weekly on W 9a-10:30a  [employer] [facility] [address] [place] [phone] PANTRY 2nd & 4th Th: 1p - 2:30p  [facility] [address] [place] [phone] PANTRY 1st & 3rd F: 9a-12p; 2nd&4th Sa: 9a-12p  [facility] [address] [place] [phone] PANTRY T-TH 9am-2pm   [facility] [address] [place] [phone] PANTRY Thursdays 10am-12:30pm  [employer] [facility] [address] [phone] PANTRY 4th [place]: 9a - [address] [facility] [address] [place] [phone] PANTRY 2nd M 5:30p - 7:30p; each W 9:30a - 11:30a  [facility] [address] [place] [phone] PANTRY Tues 11a-1p call for appointment preferred  [facility] [address] [place] [phone] PANTRY mobile distribution sites; call agency for listing  [facility] [address] [place] [phone] PANTRY W: 9a-11a  [facility] [address] [place] [place] [place] [phone] ` T: 12:p -2p  [facility] [address] [place] [phone] PANTRY M: 9:30a - 2:30p; Tu: 9:30a - 2:30p; W: 9:30a - 2:30p; Th: 9:30a - 2:30p  [facility] [address] [place] [phone]  PANTRY F: 11a - 2p  [facility] [address] [place] [phone] PANTRY mobile distribution sites; call agency for listing  [facility] [address] [place] [phone] PANTRY M & W 6:30p - 8:30p  Pop up on Th 11a - 12:30p at different locations (call)  [facility] [address] [place] [phone] SOUP KITCHEN M & Th: 6p-9p  [facility] [address] [place] [phone], [place] 3rd Thursday 2:30-3:30  [facility] [address] [place] [phone] PANTRY Tu: 9a - 11a; Th: 9a - 11a
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
## 2024-11-28 ENCOUNTER — Inpatient Hospital Stay (HOSPITAL_COMMUNITY)

## 2024-11-28 DIAGNOSIS — S72002A Fracture of unspecified part of neck of left femur, initial encounter for closed fracture: Secondary | ICD-10-CM | POA: Diagnosis not present

## 2024-11-28 LAB — BASIC METABOLIC PANEL WITH GFR
Anion gap: 5 (ref 5–15)
BUN: 13 mg/dL (ref 8–23)
CO2: 33 mmol/L — ABNORMAL HIGH (ref 22–32)
Calcium: 8.4 mg/dL — ABNORMAL LOW (ref 8.9–10.3)
Chloride: 100 mmol/L (ref 98–111)
Creatinine, Ser: 0.64 mg/dL (ref 0.44–1.00)
GFR, Estimated: >60 mL/min
Glucose, Bld: 143 mg/dL — ABNORMAL HIGH (ref 70–99)
Potassium: 4.2 mmol/L (ref 3.5–5.1)
Sodium: 139 mmol/L (ref 135–145)

## 2024-11-28 LAB — FOLATE: Folate: 4.3 ng/mL — ABNORMAL LOW

## 2024-11-28 LAB — CBC
HCT: 23.4 % — ABNORMAL LOW (ref 36.0–46.0)
Hemoglobin: 7.5 g/dL — ABNORMAL LOW (ref 12.0–15.0)
MCH: 28.8 pg (ref 26.0–34.0)
MCHC: 32.1 g/dL (ref 30.0–36.0)
MCV: 90 fL (ref 80.0–100.0)
Platelets: 208 K/uL (ref 150–400)
RBC: 2.6 MIL/uL — ABNORMAL LOW (ref 3.87–5.11)
RDW: 14.2 % (ref 11.5–15.5)
WBC: 9.2 K/uL (ref 4.0–10.5)
nRBC: 0 % (ref 0.0–0.2)

## 2024-11-28 LAB — VITAMIN B12: Vitamin B-12: 529 pg/mL (ref 180–914)

## 2024-11-28 MED ORDER — GUAIFENESIN ER 600 MG PO TB12
1200.0000 mg | ORAL_TABLET | Freq: Two times a day (BID) | ORAL | Status: DC
  Administered 2024-11-28 – 2024-12-01 (×7): 1200 mg via ORAL
  Filled 2024-11-28 (×7): qty 2

## 2024-11-28 MED ORDER — DEXTROMETHORPHAN POLISTIREX ER 30 MG/5ML PO SUER
30.0000 mg | Freq: Two times a day (BID) | ORAL | Status: DC
  Administered 2024-11-28 – 2024-12-01 (×7): 30 mg via ORAL
  Filled 2024-11-28 (×7): qty 5

## 2024-11-28 MED ORDER — ACETAMINOPHEN 500 MG PO TABS
1000.0000 mg | ORAL_TABLET | Freq: Three times a day (TID) | ORAL | Status: DC
  Administered 2024-11-28 – 2024-12-01 (×9): 1000 mg via ORAL
  Filled 2024-11-28 (×9): qty 2

## 2024-11-28 MED ORDER — OXYCODONE-ACETAMINOPHEN 5-325 MG PO TABS: 1.0000 | ORAL_TABLET | ORAL | Status: DC | PRN

## 2024-11-28 MED ORDER — ALPRAZOLAM 0.5 MG PO TABS
0.5000 mg | ORAL_TABLET | Freq: Two times a day (BID) | ORAL | Status: DC | PRN
  Administered 2024-11-28 (×2): 0.5 mg via ORAL
  Filled 2024-11-28 (×2): qty 1

## 2024-11-28 MED ORDER — FERROUS SULFATE 325 (65 FE) MG PO TABS
325.0000 mg | ORAL_TABLET | Freq: Every day | ORAL | Status: DC
  Administered 2024-11-28 – 2024-12-01 (×4): 325 mg via ORAL
  Filled 2024-11-28 (×4): qty 1

## 2024-11-28 MED ORDER — OXYCODONE HCL 5 MG PO TABS: 5.0000 mg | ORAL_TABLET | ORAL | Status: DC | PRN

## 2024-11-28 MED ORDER — HYDROCOD POLI-CHLORPHE POLI ER 10-8 MG/5ML PO SUER
5.0000 mL | Freq: Two times a day (BID) | ORAL | Status: DC | PRN
  Administered 2024-11-30: 5 mL via ORAL
  Filled 2024-11-28: qty 5

## 2024-11-28 MED ORDER — FOLIC ACID 1 MG PO TABS
1.0000 mg | ORAL_TABLET | Freq: Every day | ORAL | Status: DC
  Administered 2024-11-28 – 2024-12-01 (×4): 1 mg via ORAL
  Filled 2024-11-28 (×4): qty 1

## 2024-11-28 MED ORDER — OXYCODONE HCL 5 MG PO TABS
5.0000 mg | ORAL_TABLET | Freq: Four times a day (QID) | ORAL | Status: DC | PRN
  Administered 2024-11-28: 5 mg via ORAL
  Administered 2024-11-28: 10 mg via ORAL
  Administered 2024-11-29: 5 mg via ORAL
  Administered 2024-11-29 – 2024-12-01 (×7): 10 mg via ORAL
  Filled 2024-11-28 (×3): qty 2
  Filled 2024-11-28: qty 1
  Filled 2024-11-28 (×4): qty 2
  Filled 2024-11-28: qty 1
  Filled 2024-11-28: qty 2
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
## 2024-11-28 NOTE — NC FL2 (Signed)
 " [place]  MEDICAID FL2 LEVEL OF CARE FORM     IDENTIFICATION  Patient Name: Casey [name] Birthdate: 1961-09-15 Sex: female Admission Date (Current Location): 11/26/2024  [facility] and [place] Number:  Producer, Television/film/video and Address:  [facility] [place], [address] [place], [place] [address]      Provider Number: [id number]  Attending Physician Name and Address:  Madelyne Owen LABOR, MD  Relative Name and Phone Number:       Current Level of Care: Hospital Recommended Level of Care: Skilled Nursing Facility Prior Approval Number:    Date Approved/Denied:   PASRR Number: [id number]  Discharge Plan: SNF    Current Diagnoses: Patient Active Problem List   Diagnosis Date Noted   Closed left hip fracture (HCC) 11/26/2024   History of recreational drug use 11/26/2024   Upper airway cough syndrome 11/08/2024   Urinary incontinence 05/11/2024   Acute exacerbation of chronic obstructive pulmonary disease (COPD) (HCC) 04/20/2024   Anxiety 04/20/2024   COPD exacerbation (HCC) 03/31/2024   Chronic back pain 03/31/2024   Community acquired pneumonia 03/27/2024   Panic attacks 03/27/2024   Cigarette smoker 02/10/2024   Chronic respiratory failure with hypoxia (HCC) 02/10/2024   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   Pneumothorax on left 11/26/2023   COPD GOLD 3 09/28/2023   Encounter for immunization 08/26/2023   Tobacco abuse 08/15/2023   Malnutrition of moderate degree 07/27/2023   CAP (community acquired pneumonia) 07/26/2023   Acute and chronic respiratory failure with hypoxia (HCC) 07/26/2023   Cocaine abuse 07/26/2023   Tetrahydrocannabinol (THC) use disorder 07/26/2023   Hypokalemia 07/26/2023   Hyperglycemia 07/26/2023   Drug-seeking behavior 07/26/2023   Leukocytosis 07/26/2023   Opioid use disorder 07/17/2023   Pneumonia of right upper lobe due to infectious organism 07/08/2023   Delirium 07/07/2023   Vomiting without nausea  07/07/2023   COPD with acute exacerbation (HCC) 07/06/2023   Normocytic anemia 07/06/2023   Pneumonia of right lower lobe due to infectious organism 07/06/2023   Nausea and vomiting 07/05/2023   Severe protein-calorie malnutrition 06/29/2023   Polysubstance abuse (HCC) 06/26/2023   Acute metabolic encephalopathy 06/26/2023   Aspiration pneumonia (HCC) 06/25/2023   Primary insomnia 08/20/2021   Depression, recurrent 08/20/2021   GAD (generalized anxiety disorder) 08/20/2021   Elevated blood pressure reading in office without diagnosis of hypertension 08/20/2021   Closed fracture of left proximal humerus 01/01/2020   Fracture of base of fifth metacarpal bone of right hand 10/06/2016   Benzodiazepine dependence, continuous (HCC) 01/09/2016   History of panic attacks 09/11/2014   Malaise 09/03/2014   Fatigue 09/03/2014   Body mass index (BMI) of 20.0-20.9 in adult 09/03/2014    Orientation RESPIRATION BLADDER Height & Weight     Self, Time, Situation, Place  O2 (3L nasal cannula) Continent Weight: 130 lb (59 kg) Height:  5' 5 (165.1 cm)  BEHAVIORAL SYMPTOMS/MOOD NEUROLOGICAL BOWEL NUTRITION STATUS      Continent Diet (see d/c summary)  AMBULATORY STATUS COMMUNICATION OF NEEDS Skin   Extensive Assist Verbally Normal                       Personal Care Assistance Level of Assistance  Bathing, Feeding, Dressing Bathing Assistance: Maximum assistance Feeding assistance: Independent Dressing Assistance: Limited assistance     Functional Limitations Info  Sight, Hearing, Speech Sight Info: Adequate Hearing Info: Adequate Speech Info: Adequate    SPECIAL CARE FACTORS FREQUENCY  OT (By  licensed OT), PT (By licensed PT)     PT Frequency: 5x/week OT Frequency: 5x/week            Contractures Contractures Info: Not present    Additional Factors Info  Code Status, Allergies Code Status Info: full code Allergies Info: Methylprednisolone            Current  Medications (11/28/2024):  This is the current hospital active medication list Current Facility-Administered Medications  Medication Dose Route Frequency Provider Last Rate Last Admin   acetaminophen  (TYLENOL ) tablet 1,000 mg  1,000 mg Oral TID Regalado, Belkys A, MD   1,000 mg at 11/28/24 1049   acetaminophen  (TYLENOL ) tablet 325-650 mg  325-650 mg Oral Q6H PRN Danton Lauraine LABOR, PA-C   650 mg at 11/27/24 2057   ALPRAZolam  (XANAX ) tablet 0.5 mg  0.5 mg Oral BID PRN Regalado, Belkys A, MD       arformoterol  (BROVANA ) nebulizer solution 15 mcg  15 mcg Nebulization BID Danton Lauraine LABOR, PA-C   15 mcg at 11/28/24 9171   budesonide  (PULMICORT ) nebulizer solution 0.25 mg  0.25 mg Nebulization BID Danton Lauraine LABOR, PA-C   0.25 mg at 11/28/24 9168   celecoxib  (CELEBREX ) capsule 200 mg  200 mg Oral BID Danton Lauraine LABOR, PA-C   200 mg at 11/28/24 1033   chlorpheniramine-HYDROcodone  (TUSSIONEX) 10-8 MG/5ML suspension 5 mL  5 mL Oral Q12H PRN Regalado, Belkys A, MD       cholecalciferol  (VITAMIN D3) 25 MCG (1000 UNIT) tablet 1,000 Units  1,000 Units Oral Daily Danton Lauraine LABOR, PA-C   1,000 Units at 11/28/24 1033   citalopram  (CELEXA ) tablet 40 mg  40 mg Oral Daily Danton Lauraine LABOR, PA-C   40 mg at 11/28/24 1033   dextromethorphan  (DELSYM ) 30 MG/5ML liquid 30 mg  30 mg Oral BID Regalado, Belkys A, MD   30 mg at 11/28/24 1051   docusate sodium  (COLACE) capsule 100 mg  100 mg Oral BID Danton Lauraine LABOR, PA-C   100 mg at 11/28/24 1033   enoxaparin  (LOVENOX ) injection 40 mg  40 mg Subcutaneous Q24H Danton Lauraine LABOR, PA-C   40 mg at 11/28/24 9242   feeding supplement (ENSURE PLUS HIGH PROTEIN) liquid 237 mL  237 mL Oral BID BM McClung, Sarah A, PA-C       ferrous sulfate  tablet 325 mg  325 mg Oral Q breakfast Regalado, Belkys A, MD       folic acid  (FOLVITE ) tablet 1 mg  1 mg Oral Daily Regalado, Belkys A, MD       gabapentin  (NEURONTIN ) capsule 100 mg  100 mg Oral TID Danton Lauraine A, PA-C   100 mg at 11/28/24 1033    guaiFENesin  (MUCINEX ) 12 hr tablet 1,200 mg  1,200 mg Oral BID Regalado, Belkys A, MD   1,200 mg at 11/28/24 1049   ipratropium-albuterol  (DUONEB) 0.5-2.5 (3) MG/3ML nebulizer solution 3 mL  3 mL Nebulization Q4H PRN Danton Lauraine LABOR, PA-C       ipratropium-albuterol  (DUONEB) 0.5-2.5 (3) MG/3ML nebulizer solution 3 mL  3 mL Nebulization TID Regalado, Belkys A, MD   3 mL at 11/28/24 0828   methocarbamol  (ROBAXIN ) tablet 500 mg  500 mg Oral Q6H PRN Danton Lauraine LABOR, PA-C   500 mg at 11/28/24 9242   Or   methocarbamol  (ROBAXIN ) injection 500 mg  500 mg Intravenous Q6H PRN Danton Lauraine LABOR, PA-C       metoCLOPramide  (REGLAN ) tablet 5-10 mg  5-10 mg Oral Q8H PRN  Danton Lauraine LABOR, PA-C       Or   metoCLOPramide  (REGLAN ) injection 5-10 mg  5-10 mg Intravenous Q8H PRN Danton Lauraine A, PA-C       milk and molasses enema  1 enema Rectal Once McClung, Sarah A, PA-C       morphine  (PF) 2 MG/ML injection 0.5 mg  0.5 mg Intravenous Q2H PRN Danton Lauraine LABOR, PA-C   0.5 mg at 11/27/24 9080   multivitamin with minerals tablet 1 tablet  1 tablet Oral Daily Danton Lauraine LABOR, PA-C   1 tablet at 11/28/24 1032   ondansetron  (ZOFRAN ) tablet 4 mg  4 mg Oral Q6H PRN Danton Lauraine LABOR, PA-C       Or   ondansetron  (ZOFRAN ) injection 4 mg  4 mg Intravenous Q6H PRN Danton Lauraine LABOR, PA-C       oxyCODONE  (Oxy IR/ROXICODONE ) immediate release tablet 5-10 mg  5-10 mg Oral Q6H PRN Regalado, Belkys A, MD   10 mg at 11/28/24 1049   pantoprazole  (PROTONIX ) EC tablet 40 mg  40 mg Oral Daily Danton Lauraine LABOR, PA-C   40 mg at 11/28/24 1032   polyethylene glycol (MIRALAX  / GLYCOLAX ) packet 17 g  17 g Oral Daily PRN Danton Lauraine LABOR, PA-C       Vitamin D  (Ergocalciferol ) (DRISDOL ) 1.25 MG (50000 UNIT) capsule 50,000 Units  50,000 Units Oral Once Regalado, Belkys A, MD       zolpidem  (AMBIEN ) tablet 5 mg  5 mg Oral QHS PRN Danton Lauraine LABOR, PA-C   5 mg at 11/26/24 2214     Discharge Medications: Please see discharge summary for a  list of discharge medications.  Relevant Imaging Results:  Relevant Lab Results:   Additional Information SSN [id number], LCSW     "

## 2024-11-28 NOTE — Progress Notes (Signed)
 Orthopaedic Trauma Progress Note  SUBJECTIVE: Patient doing okay this morning.  Left reports moderate, severe pain about operative site. Doesn't feel like pain medication is helping. States she takes percocet 5-325mg  at home. I have reviewed PMP Aware, looks like last fill was for 10 tablets in June 2025.  Otherwise does not appear she receives this medication on a regular basis.  No chest pain. No SOB. No nausea/vomiting. No other complaints.  Patient is on 3 L O2 via [place] at home, states she does not have any portable tanks at home.  OBJECTIVE:  Vitals:   11/28/24 0828 11/28/24 0831  BP:    Pulse:    Resp:    Temp:    SpO2: 95% 95%    Opiates Today (MME): Today's  total administered Morphine  Milligram Equivalents: 21.5 Opiates Yesterday (MME): Yesterday's total administered Morphine  Milligram Equivalents: 64  General: Laying in bed receiving breathing treatment, NAD Respiratory: No increased work of breathing.  Operative Extremity (LLE): Dressings CDI. Tender over the hip and proximal lateral thigh as expected.  Less tender through the distal thigh, lower leg, ankle, foot.  Ankle DF/PF intact.  Compartments are soft compressible.  No significant calf and tenderness.  Endorses sensation to light touch over all aspects of the foot.  Able to wiggle the toes. + DP pulse  IMAGING: Stable post op imaging left hip  LABS:  Results for orders placed or performed during the hospital encounter of 11/26/24 (from the past 24 hours)  Surgical pcr screen     Status: None   Collection Time: 11/27/24 11:50 AM   Specimen: Nasal Mucosa; Nasal Swab  Result Value Ref Range   MRSA, PCR NEGATIVE NEGATIVE   Staphylococcus aureus NEGATIVE NEGATIVE  CBC     Status: Abnormal   Collection Time: 11/28/24  4:46 AM  Result Value Ref Range   WBC 9.2 4.0 - 10.5 K/uL   RBC 2.60 (L) 3.87 - 5.11 MIL/uL   Hemoglobin 7.5 (L) 12.0 - 15.0 g/dL   HCT 76.5 (L) 63.9 - 53.9 %   MCV 90.0 80.0 - 100.0 fL   MCH 28.8 26.0 -  34.0 pg   MCHC 32.1 30.0 - 36.0 g/dL   RDW 85.7 88.4 - 84.4 %   Platelets 208 150 - 400 K/uL   nRBC 0.0 0.0 - 0.2 %  Basic metabolic panel     Status: Abnormal   Collection Time: 11/28/24  4:46 AM  Result Value Ref Range   Sodium 139 135 - 145 mmol/L   Potassium 4.2 3.5 - 5.1 mmol/L   Chloride 100 98 - 111 mmol/L   CO2 33 (H) 22 - 32 mmol/L   Glucose, Bld 143 (H) 70 - 99 mg/dL   BUN 13 8 - 23 mg/dL   Creatinine, Ser 9.35 0.44 - 1.00 mg/dL   Calcium  8.4 (L) 8.9 - 10.3 mg/dL   GFR, Estimated >39 >39 mL/min   Anion gap 5 5 - 15    ASSESSMENT: Casey [name] is a 63 y.o. female, 1 Day Post-Op s/p fall  Procedures: CEPHALOMEDULLARY NAILING LEFT INTERTROCHANTERIC FEMUR FRACTURE  CV/Blood loss: Acute blood loss anemia, Hgb 7.5 this AM. Hemodynamically stable  PLAN: Weightbearing: WBAT LLE ROM: Unrestricted ROM Incisional and dressing care: Reinforce dressings as needed  Showering: Okay to begin getting incisions wet starting 11/30/2024 Orthopedic device(s): None  Pain management:  1. Tylenol  325-650 mg q 6 hours PRN 2. Robaxin  500 mg q 6 hours PRN 3. Oxycodone  5-10 mg q  4 hours PRN 4. Morphine  0.5 mg q 2 hours PRN 5. Celebrex  200 mg twice daily 6. Gabapentin  100 mg 3 times daily VTE prophylaxis: Lovenox , SCDs ID:  Ancef  2gm post op Foley/Lines:  No foley, KVO IVFs Impediments to Fracture Healing: Vitamin D  level 21, started on supplementation Dispo: PT/OT evaluation today, dispo pending.  Patient may require SNF at discharge.  Ortho issues stable.  Continue to work on pain control today, I have adjusted patient's pain medication.  Okay for discharge from ortho standpoint once cleared by medicine team and therapies  D/C recommendations: - Percocet, Robaxin , Tylenol , gabapentin  for pain control - Aspirin  325 mg daily x 30 days for DVT prophylaxis - Continue 1000 units Vit D3 supplementation daily x 90 days  Follow - up plan: 2 weeks after d/c for wound check and repeat  x-rays   Contact information:  Franky Light MD, Lauraine Moores PA-C. After hours and holidays please check [email or web address] for group call information for [facility]   Lauraine PATRIC Moores, PA-C [phone] (office) [email or web address]

## 2024-11-28 NOTE — Progress Notes (Signed)
 Transition of Care [facility]) - CAGE-AID Screening   Patient Details  Name: Casey [name] MRN: [id number] Date of Birth: 11/23/1960  Transition of Care (TOC) CM/SW Contact:    Sallyanne MALVA Mettle, RN Phone Number: 11/28/2024, 10:36 PM     CAGE-AID Screening:    Have You Ever Felt You Ought to Cut Down on Your Drinking or Drug Use?: No Have People Annoyed You By Critizing Your Drinking Or Drug Use?: No Have You Felt Bad Or Guilty About Your Drinking Or Drug Use?: No Have You Ever Had a Drink or Used Drugs First Thing In The Morning to Steady Your Nerves or to Get Rid of a Hangover?: No CAGE-AID Score: 0  Substance Abuse Education Offered: No (denies recent alcohol/drug used)

## 2024-11-28 NOTE — TOC Progression Note (Signed)
 Transition of Care [facility]) - Progression Note    Patient Details  Name: Casey [name] MRN: [id number] Date of Birth: 08/28/1961  Transition of [facility]) CM/SW Contact  [name], [place] Phone Number: 11/28/2024, 1:21 PM  Clinical Narrative:     CSW met with pt to discuss SNF rec. Pt agreeable to SNF w/u. CSW explains medicare coverage and insurance auth process. Fl2 completed and bed requests sent in hub. TOC to follow up with SNF offers.    Expected Discharge Plan: Skilled Nursing Facility Barriers to Discharge: No Barriers Identified             Social Drivers of Health (SDOH) Interventions SDOH Screenings   Food Insecurity: Food Insecurity Present (11/26/2024)  Housing: Low Risk (11/26/2024)  Transportation Needs: Unmet Transportation Needs (11/26/2024)  Utilities: Not At Risk (11/26/2024)  Alcohol Screen: Low Risk (09/25/2024)  Depression (PHQ2-9): High Risk (09/25/2024)  Financial Resource Strain: Low Risk (07/17/2024)   Received from [facility]  Social Connections: Unknown (11/26/2024)  Tobacco Use: Medium Risk (11/27/2024)    Readmission Risk Interventions    05/31/2024   11:10 AM 04/23/2024   10:13 AM 03/28/2024    8:07 PM  Readmission Risk Prevention Plan  Transportation Screening Complete Complete Complete  Medication Review Oceanographer) Complete Complete Complete  PCP or Specialist appointment within 3-5 days of discharge   Complete  HRI or Home Care Consult Complete Complete Complete  SW Recovery Care/Counseling Consult Complete Complete Complete  Palliative Care Screening Not Applicable Not Applicable Not Applicable  Skilled Nursing Facility Not Applicable Not Applicable Complete
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
## 2024-11-28 NOTE — Plan of Care (Signed)

## 2024-11-29 DIAGNOSIS — S72002A Fracture of unspecified part of neck of left femur, initial encounter for closed fracture: Secondary | ICD-10-CM | POA: Diagnosis not present

## 2024-11-29 LAB — CBC
HCT: 24.8 % — ABNORMAL LOW (ref 36.0–46.0)
Hemoglobin: 7.9 g/dL — ABNORMAL LOW (ref 12.0–15.0)
MCH: 28.7 pg (ref 26.0–34.0)
MCHC: 31.9 g/dL (ref 30.0–36.0)
MCV: 90.2 fL (ref 80.0–100.0)
Platelets: 209 K/uL (ref 150–400)
RBC: 2.75 MIL/uL — ABNORMAL LOW (ref 3.87–5.11)
RDW: 14.5 % (ref 11.5–15.5)
WBC: 11.4 K/uL — ABNORMAL HIGH (ref 4.0–10.5)
nRBC: 0 % (ref 0.0–0.2)

## 2024-11-29 LAB — IRON AND TIBC
Iron: <10 ug/dL — ABNORMAL LOW (ref 28–170)
TIBC: 206 ug/dL — ABNORMAL LOW (ref 250–450)

## 2024-11-29 LAB — GLUCOSE, CAPILLARY: Glucose-Capillary: 115 mg/dL — ABNORMAL HIGH (ref 70–99)

## 2024-11-29 LAB — RETICULOCYTES
Immature Retic Fract: 10.8 % (ref 2.3–15.9)
RBC.: 2.76 MIL/uL — ABNORMAL LOW (ref 3.87–5.11)
Retic Count, Absolute: 32.6 K/uL (ref 19.0–186.0)
Retic Ct Pct: 1.2 % (ref 0.4–3.1)

## 2024-11-29 LAB — FERRITIN: Ferritin: 120 ng/mL (ref 11–307)

## 2024-11-29 MED ORDER — AZITHROMYCIN 500 MG PO TABS
500.0000 mg | ORAL_TABLET | Freq: Every day | ORAL | Status: AC
  Administered 2024-11-29 – 2024-12-01 (×3): 500 mg via ORAL
  Filled 2024-11-29 (×3): qty 1

## 2024-11-29 MED ORDER — VITAMIN D3 25 MCG PO TABS: 1000.0000 [IU] | ORAL_TABLET | Freq: Every day | ORAL | Status: AC

## 2024-11-29 MED ORDER — PANTOPRAZOLE SODIUM 40 MG PO TBEC: 40.0000 mg | DELAYED_RELEASE_TABLET | Freq: Every day | ORAL | Status: DC

## 2024-11-29 MED ORDER — IRON SUCROSE 200 MG IVPB - SIMPLE MED
200.0000 mg | Freq: Once | Status: AC
  Administered 2024-11-29: 200 mg via INTRAVENOUS
  Filled 2024-11-29: qty 200

## 2024-11-29 MED ORDER — METHOCARBAMOL 500 MG PO TABS: 500.0000 mg | ORAL_TABLET | Freq: Four times a day (QID) | ORAL | 0 refills | Status: AC | PRN

## 2024-11-29 MED ORDER — OXYCODONE HCL 5 MG PO TABS: 5.0000 mg | ORAL_TABLET | Freq: Four times a day (QID) | ORAL | 0 refills | Status: AC | PRN

## 2024-11-29 MED ORDER — BENZONATATE 100 MG PO CAPS
100.0000 mg | ORAL_CAPSULE | Freq: Once | ORAL | Status: AC
  Administered 2024-11-29: 100 mg via ORAL
  Filled 2024-11-29: qty 1

## 2024-11-29 MED ORDER — ASPIRIN 81 MG PO TBEC: 81.0000 mg | DELAYED_RELEASE_TABLET | Freq: Every day | ORAL | Status: AC

## 2024-11-29 MED ORDER — MAGNESIUM SULFATE 2 GM/50ML IV SOLN
2.0000 g | Freq: Once | INTRAVENOUS | Status: AC
  Administered 2024-11-29: 2 g via INTRAVENOUS
  Filled 2024-11-29: qty 50

## 2024-11-29 MED ORDER — ALBUTEROL SULFATE (2.5 MG/3ML) 0.083% IN NEBU: 2.5000 mg | INHALATION_SOLUTION | RESPIRATORY_TRACT | Status: DC | PRN

## 2024-11-29 MED ORDER — PREDNISONE 10 MG PO TABS
50.0000 mg | ORAL_TABLET | Freq: Once | ORAL | Status: AC
  Administered 2024-11-29: 50 mg via ORAL
  Filled 2024-11-29: qty 5

## 2024-11-29 MED ORDER — PANTOPRAZOLE SODIUM 40 MG PO TBEC
40.0000 mg | DELAYED_RELEASE_TABLET | Freq: Two times a day (BID) | ORAL | Status: DC
  Administered 2024-11-29 – 2024-12-01 (×4): 40 mg via ORAL
  Filled 2024-11-29 (×4): qty 1

## 2024-11-29 MED ORDER — ALPRAZOLAM 0.5 MG PO TABS
0.5000 mg | ORAL_TABLET | Freq: Three times a day (TID) | ORAL | Status: DC | PRN
  Administered 2024-11-29 – 2024-11-30 (×5): 0.5 mg via ORAL
  Filled 2024-11-29 (×5): qty 1

## 2024-11-29 NOTE — Progress Notes (Signed)
 Orthopaedic Trauma Progress Note  SUBJECTIVE: Patient doing okay this morning.  Notes pain is slightly better after medication change yesterday.  Denies any numbness or tingling throughout the left lower extremity.  Was able to mobilize to bedside chair with therapies.  No chest pain. No SOB. No nausea/vomiting. No other complaints.    OBJECTIVE:  Vitals:   11/28/24 2350 11/29/24 0717  BP: 103/60 122/66  Pulse: 97 (!) 117  Resp: 20 16  Temp: 98.2 F (36.8 C) 98.2 F (36.8 C)  SpO2: 93% 92%    Opiates Today (MME): Today's  total administered Morphine  Milligram Equivalents: 9 Opiates Yesterday (MME): Yesterday's total administered Morphine  Milligram Equivalents: 44  General: Laying in bed, NAD Respiratory: No increased work of breathing.  Operative Extremity (LLE): Dressings removed incisions are CDI.  Some soreness with palpation over the hip and proximal lateral thigh as expected.  Less tender through the distal thigh, lower leg, ankle, foot.  Ankle DF/PF intact.  Compartments are soft compressible.  No significant calf and tenderness.  Endorses sensation to light touch over all aspects of the foot.  Able to wiggle the toes. + DP pulse  IMAGING: Stable post op imaging left hip  LABS:  Results for orders placed or performed during the hospital encounter of 11/26/24 (from the past 24 hours)  Vitamin B12     Status: None   Collection Time: 11/28/24 10:26 AM  Result Value Ref Range   Vitamin B-12 529 180 - 914 pg/mL  Folate     Status: Abnormal   Collection Time: 11/28/24 10:26 AM  Result Value Ref Range   Folate 4.3 (L) >5.9 ng/mL  CBC     Status: Abnormal   Collection Time: 11/29/24  6:48 AM  Result Value Ref Range   WBC 11.4 (H) 4.0 - 10.5 K/uL   RBC 2.75 (L) 3.87 - 5.11 MIL/uL   Hemoglobin 7.9 (L) 12.0 - 15.0 g/dL   HCT 75.1 (L) 63.9 - 53.9 %   MCV 90.2 80.0 - 100.0 fL   MCH 28.7 26.0 - 34.0 pg   MCHC 31.9 30.0 - 36.0 g/dL   RDW 85.4 88.4 - 84.4 %   Platelets 209 150 -  400 K/uL   nRBC 0.0 0.0 - 0.2 %  Iron  and TIBC     Status: Abnormal   Collection Time: 11/29/24  6:48 AM  Result Value Ref Range   Iron  <10 (L) 28 - 170 ug/dL   TIBC 793 (L) 749 - 549 ug/dL   Saturation Ratios NOT CALCULATED 10.4 - 31.8 %   UIBC NOT CALCULATED ug/dL  Ferritin     Status: None   Collection Time: 11/29/24  6:48 AM  Result Value Ref Range   Ferritin 120 11 - 307 ng/mL  Reticulocytes     Status: Abnormal   Collection Time: 11/29/24  6:48 AM  Result Value Ref Range   Retic Ct Pct 1.2 0.4 - 3.1 %   RBC. 2.76 (L) 3.87 - 5.11 MIL/uL   Retic Count, Absolute 32.6 19.0 - 186.0 K/uL   Immature Retic Fract 10.8 2.3 - 15.9 %  Glucose, capillary     Status: Abnormal   Collection Time: 11/29/24  7:18 AM  Result Value Ref Range   Glucose-Capillary 115 (H) 70 - 99 mg/dL    ASSESSMENT: Casey [name] is a 64 y.o. female, 2 Days Post-Op s/p fall  Procedures: CEPHALOMEDULLARY NAILING LEFT INTERTROCHANTERIC FEMUR FRACTURE  CV/Blood loss: Acute blood loss anemia, Hgb 7.9  this AM. Hemodynamically stable  PLAN: Weightbearing: WBAT LLE ROM: Unrestricted ROM Incisional and dressing care: Dressings removed today.  Continue to change as needed.  Okay to leave open to air Showering: Okay to begin getting incisions wet starting 11/30/2024 Orthopedic device(s): None  Pain management:  1. Tylenol  325-650 mg q 6 hours PRN 2. Robaxin  500 mg q 6 hours PRN 3. Oxycodone  5-10 mg q 4 hours PRN 4. Morphine  0.5 mg q 2 hours PRN 5. Celebrex  200 mg twice daily 6. Gabapentin  100 mg 3 times daily VTE prophylaxis: Lovenox , SCDs ID:  Ancef  2gm post op completed Foley/Lines:  No foley, KVO IVFs Impediments to Fracture Healing: Vitamin D  level 21, started on supplementation Dispo: PT/OT evaluation ongoing, recommending SNF at discharge.  Ortho issues stable.  Okay for discharge from ortho standpoint once cleared by medicine team and therapies  D/C recommendations: - Oxycodone , Robaxin , Tylenol ,  gabapentin  for pain control - Aspirin  81 mg daily x 30 days for DVT prophylaxis - Continue 1000 units Vit D3 supplementation daily x 90 days  Follow - up plan: 2 weeks after d/c for wound check and repeat x-rays   Contact information:  Franky Light MD, Lauraine Moores PA-C. After hours and holidays please check [email or web address] for group call information for [facility]   Lauraine PATRIC Moores, PA-C [phone] (office) [email or web address]

## 2024-11-29 NOTE — Care Management Important Message (Signed)
 Important Message  Patient Details  Name: Casey [name] MRN: [id number] Date of Birth: 12/06/1960   Important Message Given:  Yes - Medicare IM     Claretta Deed 11/29/2024, 3:48 PM

## 2024-11-29 NOTE — TOC Progression Note (Signed)
 Transition of Care [facility]) - Progression Note    Patient Details  Name: Casey [name] MRN: [id number] Date of Birth: 11/11/1960  Transition of Care [facility]) CM/SW Contact  [name], [place] Phone Number: 11/29/2024, 1:12 PM  Clinical Narrative:     CSW provided pt with SNF bed offers and medicare star ratings. Pt chooses [facility] as it is closest to her home. CSW confirmed bed with [facility]. SNF auth request initiated in online portal. Ref# [id number]  Auth is pending  Expected Discharge Plan: Skilled Nursing Facility Barriers to Discharge: Continued Medical Work up, English As A Second Language Teacher               Expected Discharge Plan and Services       Living arrangements for the past 2 months: Single Family Home                                       Social Drivers of Health (SDOH) Interventions SDOH Screenings   Food Insecurity: Food Insecurity Present (11/26/2024)  Housing: Low Risk (11/26/2024)  Transportation Needs: Unmet Transportation Needs (11/26/2024)  Utilities: Not At Risk (11/26/2024)  Alcohol Screen: Low Risk (09/25/2024)  Depression (PHQ2-9): High Risk (09/25/2024)  Financial Resource Strain: Low Risk (07/17/2024)   Received from [facility]  Social Connections: Unknown (11/26/2024)  Tobacco Use: Medium Risk (11/27/2024)    Readmission Risk Interventions    05/31/2024   11:10 AM 04/23/2024   10:13 AM 03/28/2024    8:07 PM  Readmission Risk Prevention Plan  Transportation Screening Complete Complete Complete  Medication Review Oceanographer) Complete Complete Complete  PCP or Specialist appointment within 3-5 days of discharge   Complete  HRI or Home Care Consult Complete Complete Complete  SW Recovery Care/Counseling Consult Complete Complete Complete  Palliative Care Screening Not Applicable Not Applicable Not Applicable  Skilled Nursing Facility Not Applicable Not Applicable Complete
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
## 2024-11-29 NOTE — Plan of Care (Incomplete)

## 2024-11-30 ENCOUNTER — Encounter (HOSPITAL_COMMUNITY): Payer: Self-pay | Admitting: Student

## 2024-11-30 DIAGNOSIS — S72002A Fracture of unspecified part of neck of left femur, initial encounter for closed fracture: Secondary | ICD-10-CM | POA: Diagnosis not present

## 2024-11-30 LAB — CBC
HCT: 23.5 % — ABNORMAL LOW (ref 36.0–46.0)
Hemoglobin: 7.6 g/dL — ABNORMAL LOW (ref 12.0–15.0)
MCH: 28.8 pg (ref 26.0–34.0)
MCHC: 32.3 g/dL (ref 30.0–36.0)
MCV: 89 fL (ref 80.0–100.0)
Platelets: 232 K/uL (ref 150–400)
RBC: 2.64 MIL/uL — ABNORMAL LOW (ref 3.87–5.11)
RDW: 14.5 % (ref 11.5–15.5)
WBC: 9.5 K/uL (ref 4.0–10.5)
nRBC: 0 % (ref 0.0–0.2)

## 2024-11-30 LAB — ABO/RH: ABO/RH(D): A POS

## 2024-11-30 LAB — PREPARE RBC (CROSSMATCH)

## 2024-11-30 MED ORDER — FUROSEMIDE 10 MG/ML IJ SOLN
20.0000 mg | Freq: Once | INTRAMUSCULAR | Status: AC
  Administered 2024-11-30: 20 mg via INTRAVENOUS
  Filled 2024-11-30: qty 4

## 2024-11-30 MED ORDER — SODIUM CHLORIDE 0.9% IV SOLUTION: Freq: Once | INTRAVENOUS | Status: AC

## 2024-11-30 MED ORDER — ALBUTEROL SULFATE (2.5 MG/3ML) 0.083% IN NEBU: 2.5000 mg | INHALATION_SOLUTION | Freq: Four times a day (QID) | RESPIRATORY_TRACT | Status: DC | PRN

## 2024-11-30 MED ORDER — IPRATROPIUM-ALBUTEROL 0.5-2.5 (3) MG/3ML IN SOLN
3.0000 mL | Freq: Three times a day (TID) | RESPIRATORY_TRACT | Status: DC
  Administered 2024-11-30 – 2024-12-01 (×2): 3 mL via RESPIRATORY_TRACT
  Filled 2024-11-30 (×2): qty 3

## 2024-11-30 MED ORDER — ALBUTEROL SULFATE (2.5 MG/3ML) 0.083% IN NEBU: 2.5000 mg | INHALATION_SOLUTION | RESPIRATORY_TRACT | Status: DC | PRN

## 2024-11-30 NOTE — TOC Progression Note (Signed)
 Transition of Care [facility]) - Progression Note    Patient Details  Name: Casey [name] MRN: [id number] Date of Birth: 10-28-1961  Transition of Care [facility]) CM/SW Contact  [name], [place] Phone Number: 11/30/2024, 12:19 PM  Clinical Narrative:     Shara approved 1/22 -1/26. Auth# [id number]. Pt not stable for DC at this time. Anticipate DC to [facility] tomorrow.   Expected Discharge Plan: Skilled Nursing Facility Barriers to Discharge: Continued Medical Work up,                Expected Discharge Plan and Services       Living arrangements for the past 2 months: Single Family Home                                       Social Drivers of Health (SDOH) Interventions SDOH Screenings   Food Insecurity: Food Insecurity Present (11/26/2024)  Housing: Low Risk (11/26/2024)  Transportation Needs: Unmet Transportation Needs (11/26/2024)  Utilities: Not At Risk (11/26/2024)  Alcohol Screen: Low Risk (09/25/2024)  Depression (PHQ2-9): High Risk (09/25/2024)  Financial Resource Strain: Low Risk (07/17/2024)   Received from [facility]  Social Connections: Unknown (11/26/2024)  Tobacco Use: Medium Risk (11/27/2024)    Readmission Risk Interventions    05/31/2024   11:10 AM 04/23/2024   10:13 AM 03/28/2024    8:07 PM  Readmission Risk Prevention Plan  Transportation Screening Complete Complete Complete  Medication Review Oceanographer) Complete Complete Complete  PCP or Specialist appointment within 3-5 days of discharge   Complete  HRI or Home Care Consult Complete Complete Complete  SW Recovery Care/Counseling Consult Complete Complete Complete  Palliative Care Screening Not Applicable Not Applicable Not Applicable  Skilled Nursing Facility Not Applicable Not Applicable Complete

## 2024-11-30 NOTE — Plan of Care (Signed)
" °  Problem: Elimination: Goal: Will not experience complications related to bowel motility Outcome: Progressing   Problem: Pain Managment: Goal: General experience of comfort will improve and/or be controlled Outcome: Progressing   Problem: Safety: Goal: Ability to remain free from injury will improve Outcome: Progressing   Problem: Clinical Measurements: Goal: Ability to maintain clinical measurements within normal limits Outcome: Progressing   Problem: Respiratory: Goal: Will regain and/or maintain adequate ventilation Outcome: Progressing   Problem: Skin Integrity: Goal: Demonstrates signs of wound healing without infection Outcome: Progressing   Problem: Urinary Elimination: Goal: Will remain free from infection Outcome: Progressing   "
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
## 2024-12-01 DIAGNOSIS — S72002A Fracture of unspecified part of neck of left femur, initial encounter for closed fracture: Secondary | ICD-10-CM | POA: Diagnosis not present

## 2024-12-01 LAB — TYPE AND SCREEN
ABO/RH(D): A POS
Antibody Screen: NEGATIVE
Unit division: 0

## 2024-12-01 LAB — BPAM RBC
Blood Product Expiration Date: 202602072359
ISSUE DATE / TIME: 202601221154
Unit Type and Rh: 6200

## 2024-12-01 LAB — CBC
HCT: 27.9 % — ABNORMAL LOW (ref 36.0–46.0)
Hemoglobin: 8.9 g/dL — ABNORMAL LOW (ref 12.0–15.0)
MCH: 28.3 pg (ref 26.0–34.0)
MCHC: 31.9 g/dL (ref 30.0–36.0)
MCV: 88.9 fL (ref 80.0–100.0)
Platelets: 317 K/uL (ref 150–400)
RBC: 3.14 MIL/uL — ABNORMAL LOW (ref 3.87–5.11)
RDW: 15.1 % (ref 11.5–15.5)
WBC: 10.3 K/uL (ref 4.0–10.5)
nRBC: 0 % (ref 0.0–0.2)

## 2024-12-01 MED ORDER — POLYETHYLENE GLYCOL 3350 17 G PO PACK: 17.0000 g | PACK | Freq: Every day | ORAL | 0 refills | Status: AC

## 2024-12-01 MED ORDER — AZITHROMYCIN 500 MG PO TABS: 500.0000 mg | ORAL_TABLET | Freq: Every day | ORAL | 0 refills | Status: AC

## 2024-12-01 MED ORDER — DOCUSATE SODIUM 100 MG PO CAPS: 100.0000 mg | ORAL_CAPSULE | Freq: Two times a day (BID) | ORAL | 0 refills | Status: AC

## 2024-12-01 MED ORDER — PANTOPRAZOLE SODIUM 40 MG PO TBEC: 40.0000 mg | DELAYED_RELEASE_TABLET | Freq: Two times a day (BID) | ORAL | 0 refills | Status: AC

## 2024-12-01 MED ORDER — FERROUS SULFATE 325 (65 FE) MG PO TABS: 325.0000 mg | ORAL_TABLET | Freq: Every day | ORAL | 3 refills | Status: AC

## 2024-12-01 MED ORDER — CELECOXIB 200 MG PO CAPS: 200.0000 mg | ORAL_CAPSULE | Freq: Two times a day (BID) | ORAL | 0 refills | Status: AC

## 2024-12-01 MED ORDER — GUAIFENESIN ER 600 MG PO TB12: 1200.0000 mg | ORAL_TABLET | Freq: Two times a day (BID) | ORAL | 0 refills | Status: AC

## 2024-12-01 MED ORDER — ADULT MULTIVITAMIN W/MINERALS CH: 1.0000 | ORAL_TABLET | Freq: Every day | ORAL | 0 refills | Status: AC

## 2024-12-01 MED ORDER — ALPRAZOLAM 0.5 MG PO TABS: 0.5000 mg | ORAL_TABLET | Freq: Three times a day (TID) | ORAL | 0 refills | Status: AC | PRN

## 2024-12-01 MED ORDER — HYDROCOD POLI-CHLORPHE POLI ER 10-8 MG/5ML PO SUER: 5.0000 mL | Freq: Two times a day (BID) | ORAL | 0 refills | Status: AC | PRN

## 2024-12-01 MED ORDER — FOLIC ACID 1 MG PO TABS: 1.0000 mg | ORAL_TABLET | Freq: Every day | ORAL | 0 refills | Status: AC

## 2024-12-01 MED ORDER — ARFORMOTEROL TARTRATE 15 MCG/2ML IN NEBU: 15.0000 ug | INHALATION_SOLUTION | Freq: Two times a day (BID) | RESPIRATORY_TRACT | 0 refills | Status: AC

## 2024-12-01 MED ORDER — BUDESONIDE 0.25 MG/2ML IN SUSP: 0.2500 mg | Freq: Two times a day (BID) | RESPIRATORY_TRACT | 12 refills | Status: AC

## 2024-12-01 MED ORDER — DEXTROMETHORPHAN POLISTIREX ER 30 MG/5ML PO SUER: 30.0000 mg | Freq: Two times a day (BID) | ORAL | 0 refills | Status: AC

## 2024-12-01 MED ORDER — IPRATROPIUM-ALBUTEROL 0.5-2.5 (3) MG/3ML IN SOLN: RESPIRATORY_TRACT | 11 refills | Status: AC

## 2024-12-01 NOTE — TOC Transition Note (Signed)
 Transition of Care [facility]) - Discharge Note   Patient Details  Name: Casey [name] MRN: [id number] Date of Birth: 09-14-1961  [facility]) CM/SW Contact:  [name], LCSW Phone Number: 12/01/2024, 10:02 AM   Clinical Narrative:     Per MD patient ready for DC to [facility]. RN, patient, and facility notified of DC. Discharge Summary and FL2 sent to facility. RN to call report prior to discharge [phone] ). DC packet on chart. Ambulance transport requested for patient.   CSW will sign off for now as social work intervention is no longer needed. Please consult us  again if new needs arise.   Final next level of care: Skilled Nursing Facility Barriers to Discharge: No Barriers Identified   Patient Goals and CMS Choice Patient states their goals for this hospitalization and ongoing recovery are:: Rehab near home CMS Medicare.gov Compare Post Acute Care list provided to:: Patient Choice offered to / list presented to : Patient      Discharge Placement              Patient [facility] bed at: [facility] Patient to be transferred to facility by: [facility]   Patient and family notified of of transfer: 12/01/24  Discharge Plan and Services Additional resources added to the After Visit Summary for                                       Social Drivers of Health (SDOH) Interventions SDOH Screenings   Food Insecurity: Food Insecurity Present (11/26/2024)  Housing: Low Risk (11/26/2024)  Transportation Needs: Unmet Transportation Needs (11/26/2024)  Utilities: Not At Risk (11/26/2024)  Alcohol Screen: Low Risk (09/25/2024)  Depression (PHQ2-9): High Risk (09/25/2024)  Financial Resource Strain: Low Risk (07/17/2024)   Received from [facility]  Social Connections: Unknown (11/26/2024)  Tobacco Use: Medium Risk (11/27/2024)     Readmission Risk Interventions    05/31/2024   11:10 AM 04/23/2024   10:13 AM 03/28/2024    8:07 PM   Readmission Risk Prevention Plan  Transportation Screening Complete Complete Complete  Medication Review Oceanographer) Complete Complete Complete  PCP or Specialist appointment within 3-5 days of discharge   Complete  HRI or Home Care Consult Complete Complete Complete  SW Recovery Care/Counseling Consult Complete Complete Complete  Palliative Care Screening Not Applicable Not Applicable Not Applicable  Skilled Nursing Facility Not Applicable Not Applicable Complete

## 2024-12-01 NOTE — Progress Notes (Signed)
 Called [facility] several times to give report to [phone] and no one picks up and it hangs up. Was not able to give report.
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
## 2024-12-04 ENCOUNTER — Telehealth (HOSPITAL_COMMUNITY): Payer: Self-pay | Admitting: *Deleted

## 2024-12-04 ENCOUNTER — Other Ambulatory Visit (HOSPITAL_COMMUNITY): Payer: Self-pay | Admitting: Registered Nurse

## 2024-12-04 DIAGNOSIS — F331 Major depressive disorder, recurrent, moderate: Secondary | ICD-10-CM

## 2024-12-04 DIAGNOSIS — F411 Generalized anxiety disorder: Secondary | ICD-10-CM

## 2024-12-04 MED ORDER — GABAPENTIN 100 MG PO CAPS: 100.0000 mg | ORAL_CAPSULE | Freq: Three times a day (TID) | ORAL | 0 refills | Status: AC

## 2024-12-04 MED ORDER — CITALOPRAM HYDROBROMIDE 40 MG PO TABS: 40.0000 mg | ORAL_TABLET | Freq: Every day | ORAL | 0 refills | Status: AC

## 2024-12-04 NOTE — Telephone Encounter (Signed)
 Patient pharmacy [facility] called stating that they are needing refills for patient medications. Name of medication was not stated during conversation. Staff looked at when patient was last seen and it was 09/25/2024 and that was the only time patient had seen patient. Staff called patient to schedule appt and was not able to reach patient or leave message because voicemail is full.

## 2024-12-05 NOTE — Telephone Encounter (Signed)
 Noted. Unable to reach patient due to voicemail box being full.

## 2024-12-25 ENCOUNTER — Ambulatory Visit: Admitting: Internal Medicine

## 2024-12-26 ENCOUNTER — Ambulatory Visit: Admitting: Internal Medicine
